# Patient Record
Sex: Female | Born: 1950
Health system: Southern US, Community
[De-identification: ages and names within clinical notes are randomized; demographics above are authoritative.]

## PROBLEM LIST (undated history)

## (undated) DIAGNOSIS — E1165 Type 2 diabetes mellitus with hyperglycemia: Secondary | ICD-10-CM

## (undated) DIAGNOSIS — K219 Gastro-esophageal reflux disease without esophagitis: Secondary | ICD-10-CM

## (undated) DIAGNOSIS — T7840XA Allergy, unspecified, initial encounter: Secondary | ICD-10-CM

## (undated) DIAGNOSIS — Z90710 Acquired absence of both cervix and uterus: Secondary | ICD-10-CM

## (undated) DIAGNOSIS — R740 Nonspecific elevation of levels of transaminase and lactic acid dehydrogenase [LDH]: Secondary | ICD-10-CM

## (undated) DIAGNOSIS — M1990 Unspecified osteoarthritis, unspecified site: Secondary | ICD-10-CM

## (undated) DIAGNOSIS — J309 Allergic rhinitis, unspecified: Secondary | ICD-10-CM

## (undated) DIAGNOSIS — R002 Palpitations: Secondary | ICD-10-CM

## (undated) DIAGNOSIS — R5381 Other malaise: Secondary | ICD-10-CM

## (undated) DIAGNOSIS — E785 Hyperlipidemia, unspecified: Secondary | ICD-10-CM

## (undated) DIAGNOSIS — I10 Essential (primary) hypertension: Secondary | ICD-10-CM

## (undated) DIAGNOSIS — M722 Plantar fascial fibromatosis: Secondary | ICD-10-CM

## (undated) DIAGNOSIS — G4730 Sleep apnea, unspecified: Secondary | ICD-10-CM

## (undated) DIAGNOSIS — G5760 Lesion of plantar nerve, unspecified lower limb: Secondary | ICD-10-CM

## (undated) DIAGNOSIS — R5383 Other fatigue: Secondary | ICD-10-CM

## (undated) DIAGNOSIS — G4733 Obstructive sleep apnea (adult) (pediatric): Secondary | ICD-10-CM

## (undated) DIAGNOSIS — IMO0001 Reserved for inherently not codable concepts without codable children: Secondary | ICD-10-CM

## (undated) HISTORY — DX: Gastro-esophageal reflux disease without esophagitis: K21.9

## (undated) HISTORY — DX: Acquired absence of both cervix and uterus: Z90.710

## (undated) HISTORY — PX: OTHER SURGICAL HISTORY: SHX169

## (undated) HISTORY — PX: REDUCTION MAMMAPLASTY: SUR839

## (undated) HISTORY — DX: Hyperlipidemia, unspecified: E78.5

## (undated) HISTORY — DX: Essential (primary) hypertension: I10

## (undated) HISTORY — DX: Lesion of plantar nerve, unspecified lower limb: G57.60

## (undated) HISTORY — DX: Other malaise: R53.81

## (undated) HISTORY — DX: Obstructive sleep apnea (adult) (pediatric): G47.33

## (undated) HISTORY — DX: Plantar fascial fibromatosis: M72.2

## (undated) HISTORY — PX: BREAST BIOPSY: SHX20

## (undated) HISTORY — DX: Sleep apnea, unspecified: G47.30

## (undated) HISTORY — DX: Other fatigue: R53.83

## (undated) HISTORY — DX: Allergic rhinitis, unspecified: J30.9

## (undated) HISTORY — DX: Allergy, unspecified, initial encounter: T78.40XA

## (undated) HISTORY — DX: Nonspecific elevation of levels of transaminase and lactic acid dehydrogenase (ldh): R74.0

## (undated) HISTORY — DX: Reserved for inherently not codable concepts without codable children: IMO0001

## (undated) HISTORY — DX: Palpitations: R00.2

## (undated) HISTORY — DX: Type 2 diabetes mellitus with hyperglycemia: E11.65

## (undated) HISTORY — PX: BASAL CELL CARCINOMA EXCISION: SHX1214

## (undated) HISTORY — DX: Unspecified osteoarthritis, unspecified site: M19.90

---
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
## 1974-12-05 HISTORY — PX: TUBAL LIGATION: SHX77

## 1977-12-05 HISTORY — PX: ABDOMINAL HYSTERECTOMY: SHX81

## 1990-12-05 HISTORY — PX: BREAST REDUCTION SURGERY: SHX8

## 1990-12-05 HISTORY — PX: REDUCTION MAMMAPLASTY: SUR839

## 1999-01-17 ENCOUNTER — Ambulatory Visit: Admission: RE | Admit: 1999-01-17 | Discharge: 1999-01-17 | Payer: Self-pay | Admitting: *Deleted

## 1999-08-26 ENCOUNTER — Ambulatory Visit (HOSPITAL_COMMUNITY): Admission: RE | Admit: 1999-08-26 | Discharge: 1999-08-26 | Payer: Self-pay | Admitting: Gastroenterology

## 1999-08-26 ENCOUNTER — Encounter: Payer: Self-pay | Admitting: Gastroenterology

## 1999-11-12 ENCOUNTER — Encounter: Payer: Self-pay | Admitting: Gastroenterology

## 1999-11-12 ENCOUNTER — Encounter: Admission: RE | Admit: 1999-11-12 | Discharge: 1999-11-12 | Payer: Self-pay | Admitting: Gastroenterology

## 1999-12-03 ENCOUNTER — Encounter: Payer: Self-pay | Admitting: Gastroenterology

## 1999-12-03 ENCOUNTER — Encounter: Admission: RE | Admit: 1999-12-03 | Discharge: 1999-12-03 | Payer: Self-pay | Admitting: Gastroenterology

## 2000-06-12 ENCOUNTER — Encounter: Admission: RE | Admit: 2000-06-12 | Discharge: 2000-06-12 | Payer: Self-pay | Admitting: Gastroenterology

## 2000-06-12 ENCOUNTER — Encounter: Payer: Self-pay | Admitting: Gastroenterology

## 2000-12-28 ENCOUNTER — Encounter: Payer: Self-pay | Admitting: Internal Medicine

## 2000-12-28 ENCOUNTER — Ambulatory Visit (HOSPITAL_COMMUNITY): Admission: RE | Admit: 2000-12-28 | Discharge: 2000-12-28 | Payer: Self-pay | Admitting: Internal Medicine

## 2001-06-13 ENCOUNTER — Encounter: Payer: Self-pay | Admitting: Gastroenterology

## 2001-06-13 ENCOUNTER — Encounter: Admission: RE | Admit: 2001-06-13 | Discharge: 2001-06-13 | Payer: Self-pay | Admitting: Gastroenterology

## 2001-10-04 ENCOUNTER — Ambulatory Visit (HOSPITAL_COMMUNITY): Admission: RE | Admit: 2001-10-04 | Discharge: 2001-10-04 | Payer: Self-pay | Admitting: Gastroenterology

## 2002-06-10 ENCOUNTER — Encounter: Admission: RE | Admit: 2002-06-10 | Discharge: 2002-06-10 | Payer: Self-pay | Admitting: Gastroenterology

## 2002-06-10 ENCOUNTER — Encounter: Payer: Self-pay | Admitting: Gastroenterology

## 2003-04-24 ENCOUNTER — Encounter: Payer: Self-pay | Admitting: Sports Medicine

## 2003-04-24 ENCOUNTER — Encounter: Admission: RE | Admit: 2003-04-24 | Discharge: 2003-04-24 | Payer: Self-pay | Admitting: Sports Medicine

## 2003-07-17 ENCOUNTER — Encounter: Admission: RE | Admit: 2003-07-17 | Discharge: 2003-07-17 | Payer: Self-pay | Admitting: Gastroenterology

## 2003-07-17 ENCOUNTER — Encounter: Payer: Self-pay | Admitting: Gastroenterology

## 2004-07-28 ENCOUNTER — Encounter: Admission: RE | Admit: 2004-07-28 | Discharge: 2004-07-28 | Payer: Self-pay | Admitting: Gastroenterology

## 2004-10-01 ENCOUNTER — Encounter: Admission: RE | Admit: 2004-10-01 | Discharge: 2004-10-01 | Payer: Self-pay | Admitting: Gastroenterology

## 2004-12-05 HISTORY — PX: COLONOSCOPY: SHX174

## 2005-04-15 ENCOUNTER — Ambulatory Visit: Payer: Self-pay | Admitting: Pulmonary Disease

## 2005-05-20 ENCOUNTER — Ambulatory Visit: Payer: Self-pay | Admitting: Pulmonary Disease

## 2005-08-01 ENCOUNTER — Encounter: Admission: RE | Admit: 2005-08-01 | Discharge: 2005-08-01 | Payer: Self-pay | Admitting: Gastroenterology

## 2005-11-17 ENCOUNTER — Ambulatory Visit: Payer: Self-pay | Admitting: Pulmonary Disease

## 2006-01-10 ENCOUNTER — Other Ambulatory Visit: Admission: RE | Admit: 2006-01-10 | Discharge: 2006-01-10 | Payer: Self-pay | Admitting: Obstetrics and Gynecology

## 2006-02-08 ENCOUNTER — Ambulatory Visit: Payer: Self-pay | Admitting: Pulmonary Disease

## 2006-09-20 ENCOUNTER — Encounter: Admission: RE | Admit: 2006-09-20 | Discharge: 2006-09-20 | Payer: Self-pay | Admitting: Gastroenterology

## 2006-11-07 ENCOUNTER — Ambulatory Visit: Payer: Self-pay | Admitting: Pulmonary Disease

## 2007-01-05 ENCOUNTER — Ambulatory Visit: Payer: Self-pay | Admitting: Pulmonary Disease

## 2007-10-09 DIAGNOSIS — I10 Essential (primary) hypertension: Secondary | ICD-10-CM | POA: Insufficient documentation

## 2007-10-09 DIAGNOSIS — E785 Hyperlipidemia, unspecified: Secondary | ICD-10-CM | POA: Insufficient documentation

## 2007-10-09 DIAGNOSIS — J309 Allergic rhinitis, unspecified: Secondary | ICD-10-CM

## 2007-10-09 DIAGNOSIS — G4733 Obstructive sleep apnea (adult) (pediatric): Secondary | ICD-10-CM | POA: Insufficient documentation

## 2007-10-09 DIAGNOSIS — IMO0001 Reserved for inherently not codable concepts without codable children: Secondary | ICD-10-CM

## 2007-10-09 HISTORY — DX: Essential (primary) hypertension: I10

## 2007-10-09 HISTORY — DX: Obstructive sleep apnea (adult) (pediatric): G47.33

## 2007-10-09 HISTORY — DX: Reserved for inherently not codable concepts without codable children: IMO0001

## 2007-10-09 HISTORY — DX: Hyperlipidemia, unspecified: E78.5

## 2007-10-09 HISTORY — DX: Allergic rhinitis, unspecified: J30.9

## 2007-11-07 ENCOUNTER — Encounter: Admission: RE | Admit: 2007-11-07 | Discharge: 2007-11-07 | Payer: Self-pay | Admitting: Gastroenterology

## 2008-02-06 ENCOUNTER — Encounter (INDEPENDENT_AMBULATORY_CARE_PROVIDER_SITE_OTHER): Payer: Self-pay | Admitting: *Deleted

## 2008-02-20 ENCOUNTER — Ambulatory Visit: Payer: Self-pay | Admitting: Pulmonary Disease

## 2008-10-21 ENCOUNTER — Ambulatory Visit: Payer: Self-pay | Admitting: Pulmonary Disease

## 2008-10-21 DIAGNOSIS — R5383 Other fatigue: Secondary | ICD-10-CM

## 2008-10-21 DIAGNOSIS — G4710 Hypersomnia, unspecified: Secondary | ICD-10-CM | POA: Insufficient documentation

## 2008-10-21 DIAGNOSIS — R5381 Other malaise: Secondary | ICD-10-CM

## 2008-10-21 HISTORY — DX: Other fatigue: R53.83

## 2008-10-21 HISTORY — DX: Other malaise: R53.81

## 2008-12-08 ENCOUNTER — Encounter: Admission: RE | Admit: 2008-12-08 | Discharge: 2008-12-08 | Payer: Self-pay | Admitting: Gastroenterology

## 2008-12-08 ENCOUNTER — Encounter: Payer: Self-pay | Admitting: Family Medicine

## 2009-03-16 ENCOUNTER — Ambulatory Visit: Payer: Self-pay | Admitting: Family Medicine

## 2009-03-16 DIAGNOSIS — E1165 Type 2 diabetes mellitus with hyperglycemia: Secondary | ICD-10-CM

## 2009-03-16 DIAGNOSIS — E119 Type 2 diabetes mellitus without complications: Secondary | ICD-10-CM | POA: Insufficient documentation

## 2009-03-16 DIAGNOSIS — IMO0002 Reserved for concepts with insufficient information to code with codable children: Secondary | ICD-10-CM | POA: Insufficient documentation

## 2009-03-16 DIAGNOSIS — IMO0001 Reserved for inherently not codable concepts without codable children: Secondary | ICD-10-CM

## 2009-03-16 HISTORY — DX: Reserved for inherently not codable concepts without codable children: IMO0001

## 2009-03-17 LAB — CONVERTED CEMR LAB
AST: 63 units/L — ABNORMAL HIGH (ref 0–37)
Alkaline Phosphatase: 66 units/L (ref 39–117)
Calcium: 9.2 mg/dL (ref 8.4–10.5)
GFR calc non Af Amer: 90.85 mL/min (ref >60)
Hgb A1c MFr Bld: 8.8 % — ABNORMAL HIGH (ref 4.6–6.5)
LDL Cholesterol: 92 mg/dL (ref 0–99)
Potassium: 3.9 meq/L (ref 3.5–5.1)
Sodium: 139 meq/L (ref 135–145)
Total Bilirubin: 1.4 mg/dL — ABNORMAL HIGH (ref 0.3–1.2)
VLDL: 22 mg/dL (ref 0.0–40.0)

## 2009-04-15 ENCOUNTER — Encounter: Payer: Self-pay | Admitting: Pulmonary Disease

## 2009-04-16 ENCOUNTER — Telehealth (INDEPENDENT_AMBULATORY_CARE_PROVIDER_SITE_OTHER): Payer: Self-pay | Admitting: *Deleted

## 2009-04-21 ENCOUNTER — Telehealth (INDEPENDENT_AMBULATORY_CARE_PROVIDER_SITE_OTHER): Payer: Self-pay | Admitting: *Deleted

## 2009-04-24 ENCOUNTER — Telehealth: Payer: Self-pay | Admitting: Pulmonary Disease

## 2009-05-20 ENCOUNTER — Telehealth: Payer: Self-pay | Admitting: *Deleted

## 2009-07-08 ENCOUNTER — Ambulatory Visit: Payer: Self-pay | Admitting: Family Medicine

## 2009-07-08 DIAGNOSIS — R7402 Elevation of levels of lactic acid dehydrogenase (LDH): Secondary | ICD-10-CM

## 2009-07-08 DIAGNOSIS — R740 Nonspecific elevation of levels of transaminase and lactic acid dehydrogenase [LDH]: Secondary | ICD-10-CM

## 2009-07-08 DIAGNOSIS — R7401 Elevation of levels of liver transaminase levels: Secondary | ICD-10-CM

## 2009-07-08 HISTORY — DX: Elevation of levels of lactic acid dehydrogenase (LDH): R74.02

## 2009-07-08 HISTORY — DX: Elevation of levels of liver transaminase levels: R74.01

## 2009-07-13 ENCOUNTER — Telehealth: Payer: Self-pay | Admitting: Family Medicine

## 2009-07-13 LAB — CONVERTED CEMR LAB
AST: 61 units/L — ABNORMAL HIGH (ref 0–37)
Albumin: 3.8 g/dL (ref 3.5–5.2)
Alkaline Phosphatase: 71 units/L (ref 39–117)
Bilirubin, Direct: 0.2 mg/dL (ref 0.0–0.3)
Total Protein: 6.9 g/dL (ref 6.0–8.3)

## 2009-07-15 ENCOUNTER — Ambulatory Visit: Payer: Self-pay | Admitting: Family Medicine

## 2009-07-20 ENCOUNTER — Telehealth: Payer: Self-pay | Admitting: Family Medicine

## 2009-07-30 ENCOUNTER — Telehealth: Payer: Self-pay | Admitting: Family Medicine

## 2009-10-09 ENCOUNTER — Ambulatory Visit: Payer: Self-pay | Admitting: Family Medicine

## 2009-10-23 ENCOUNTER — Telehealth: Payer: Self-pay | Admitting: Family Medicine

## 2009-12-25 ENCOUNTER — Encounter: Admission: RE | Admit: 2009-12-25 | Discharge: 2009-12-25 | Payer: Self-pay | Admitting: Family Medicine

## 2010-01-08 ENCOUNTER — Ambulatory Visit: Payer: Self-pay | Admitting: Family Medicine

## 2010-01-08 LAB — CONVERTED CEMR LAB
Bilirubin Urine: NEGATIVE
Blood in Urine, dipstick: NEGATIVE
Glucose, Urine, Semiquant: NEGATIVE
Specific Gravity, Urine: 1.015
WBC Urine, dipstick: NEGATIVE
pH: 7

## 2010-01-11 LAB — CONVERTED CEMR LAB: Hgb A1c MFr Bld: 8.2 % — ABNORMAL HIGH (ref 4.6–6.5)

## 2010-05-10 ENCOUNTER — Ambulatory Visit: Payer: Self-pay | Admitting: Family Medicine

## 2010-05-31 ENCOUNTER — Encounter: Payer: Self-pay | Admitting: Family Medicine

## 2010-06-17 ENCOUNTER — Telehealth: Payer: Self-pay | Admitting: Family Medicine

## 2010-07-09 ENCOUNTER — Ambulatory Visit: Payer: Self-pay | Admitting: Pulmonary Disease

## 2010-08-16 ENCOUNTER — Telehealth: Payer: Self-pay | Admitting: Family Medicine

## 2010-08-23 ENCOUNTER — Ambulatory Visit: Payer: Self-pay | Admitting: Family Medicine

## 2010-08-26 LAB — CONVERTED CEMR LAB
Albumin: 3.8 g/dL (ref 3.5–5.2)
Cholesterol: 188 mg/dL (ref 0–200)
Hgb A1c MFr Bld: 10.5 % — ABNORMAL HIGH (ref 4.6–6.5)
LDL Cholesterol: 107 mg/dL — ABNORMAL HIGH (ref 0–99)
Total Protein: 6.5 g/dL (ref 6.0–8.3)
Triglycerides: 173 mg/dL — ABNORMAL HIGH (ref 0.0–149.0)
VLDL: 34.6 mg/dL (ref 0.0–40.0)

## 2010-08-30 ENCOUNTER — Ambulatory Visit: Payer: Self-pay | Admitting: Family Medicine

## 2010-08-31 ENCOUNTER — Telehealth: Payer: Self-pay | Admitting: *Deleted

## 2010-09-30 ENCOUNTER — Encounter: Payer: Self-pay | Admitting: Family Medicine

## 2010-09-30 ENCOUNTER — Ambulatory Visit: Payer: Self-pay | Admitting: Family Medicine

## 2010-10-14 ENCOUNTER — Ambulatory Visit: Payer: Self-pay | Admitting: Family Medicine

## 2010-10-15 LAB — CONVERTED CEMR LAB
ALT: 98 units/L — ABNORMAL HIGH (ref 0–35)
AST: 75 units/L — ABNORMAL HIGH (ref 0–37)
Albumin: 3.8 g/dL (ref 3.5–5.2)
Cholesterol: 207 mg/dL — ABNORMAL HIGH (ref 0–200)
Direct LDL: 124.9 mg/dL
Total Protein: 6.6 g/dL (ref 6.0–8.3)
Triglycerides: 125 mg/dL (ref 0.0–149.0)

## 2010-11-02 ENCOUNTER — Ambulatory Visit: Payer: Self-pay | Admitting: Internal Medicine

## 2010-11-02 DIAGNOSIS — J069 Acute upper respiratory infection, unspecified: Secondary | ICD-10-CM | POA: Insufficient documentation

## 2010-12-05 HISTORY — PX: BREAST BIOPSY: SHX20

## 2010-12-07 LAB — HM MAMMOGRAPHY

## 2010-12-09 ENCOUNTER — Ambulatory Visit
Admission: RE | Admit: 2010-12-09 | Discharge: 2010-12-09 | Payer: Self-pay | Source: Home / Self Care | Attending: Family Medicine | Admitting: Family Medicine

## 2010-12-09 ENCOUNTER — Other Ambulatory Visit: Payer: Self-pay | Admitting: Family Medicine

## 2010-12-09 LAB — HEPATIC FUNCTION PANEL
ALT: 85 U/L — ABNORMAL HIGH (ref 0–35)
AST: 71 U/L — ABNORMAL HIGH (ref 0–37)
Albumin: 3.8 g/dL (ref 3.5–5.2)
Alkaline Phosphatase: 91 U/L (ref 39–117)
Bilirubin, Direct: 0.2 mg/dL (ref 0.0–0.3)
Total Bilirubin: 1.2 mg/dL (ref 0.3–1.2)
Total Protein: 6.8 g/dL (ref 6.0–8.3)

## 2010-12-09 LAB — LIPID PANEL
Cholesterol: 165 mg/dL (ref 0–200)
HDL: 56.8 mg/dL (ref >39.00)
LDL Cholesterol: 91 mg/dL (ref 0–99)
Total CHOL/HDL Ratio: 3
Triglycerides: 84 mg/dL (ref 0.0–149.0)
VLDL: 16.8 mg/dL (ref 0.0–40.0)

## 2010-12-09 LAB — HEMOGLOBIN A1C: Hgb A1c MFr Bld: 8.6 % — ABNORMAL HIGH (ref 4.6–6.5)

## 2010-12-26 ENCOUNTER — Encounter: Payer: Self-pay | Admitting: Gastroenterology

## 2010-12-27 ENCOUNTER — Encounter
Admission: RE | Admit: 2010-12-27 | Discharge: 2010-12-27 | Payer: Self-pay | Source: Home / Self Care | Attending: Family Medicine | Admitting: Family Medicine

## 2010-12-31 ENCOUNTER — Encounter: Payer: Self-pay | Admitting: Family Medicine

## 2010-12-31 ENCOUNTER — Other Ambulatory Visit: Payer: Self-pay | Admitting: Diagnostic Radiology

## 2010-12-31 ENCOUNTER — Encounter
Admission: RE | Admit: 2010-12-31 | Discharge: 2010-12-31 | Payer: Self-pay | Source: Home / Self Care | Attending: Family Medicine | Admitting: Family Medicine

## 2011-01-04 NOTE — Assessment & Plan Note (Signed)
   Vital Signs:  Patient profile:   60 year old female Menstrual status:  hysterectomy Weight:      179 pounds Temp:     98.3 degrees F oral BP sitting:   140 / 90  (left arm) Cuff size:   regular  History of Present Illness: Here for follow up diabetes.  Recent initiation Lantus. Currently 40 units daily.  Fasting around 169-189.   No hypoglycemia except one time episode of mild symptoms after "power walk."  Overall  feels better.  Working with RD at [employer].  No problems with insulin injections.Tolerating Crestor without side effects.  Allergies: 1)  ! Codeine Sulfate (Codeine Sulfate) 2)  ! Avalide (Irbesartan-Hydrochlorothiazide) 3)  ! * H2 Blocker 4)  ! Erythromycin Base (Erythromycin Base) 5)  ! * Latex  Past History:  Past Medical History: Last updated: 04/07/2009 Allergic Rhinitis Hyperlipidemia Hypertension AHI 12.2 5/06 on CPAP +10 cm fibromyalgia Chronic bronchitis diabetes mellitus type ll periodic headaches/migraines IBS Osteoporosis Bilateral breast pain Postmenopausal Sleep Apnea Partial small bowel obsruction Schatki ring Reflux Herniated disk at C7 PMH reviewed for relevance  Physical Exam  General:  Well-developed,well-nourished,in no acute distress; alert,appropriate and cooperative throughout examination Neck:  No deformities, masses, or tenderness noted. Lungs:  Normal respiratory effort, chest expands symmetrically. Lungs are clear to auscultation, no crackles or wheezes. Heart:  Normal rate and regular rhythm. S1 and S2 normal without gallop, murmur, click, rub or other extra sounds. Extremities:  No clubbing, cyanosis, edema, or deformity noted with normal full range of motion of all joints.     Impression & Recommendations:  Problem # 1:  DIABETES MELLITUS, UNCONTROLLED (ICD-250.02) Assessment Improved increase lantus to 45 units then continue slow titration as instructed. Her updated medication list for this problem  includes:    Metformin Hcl 1000 Mg Tabs (Metformin hcl) ..... One by mouth two times a day    Hyzaar 100-25 Mg Tabs (Losartan potassium-hctz) ..... One by mouth once daily    Lantus Solostar 100 Unit/ml Soln (Insulin glargine) ..... Use as directed  Complete Medication List: 1)  Crestor 5 Mg Tabs (Rosuvastatin calcium) .... One by mouth once daily 2)  Lexapro 10 Mg Tabs (Escitalopram oxalate) .... Once daily 3)  Metformin Hcl 1000 Mg Tabs (Metformin hcl) .... One by mouth two times a day 4)  Hyzaar 100-25 Mg Tabs (Losartan potassium-hctz) .... One by mouth once daily 5)  Bd Ultra-fine Lll Mini Pen Needle 3/16", 31 Gauge  .... One two times a day 6)  Susternex Probiotic  .... Take 1 tablet by mouth once a day 7)  Lantus Solostar 100 Unit/ml Soln (Insulin glargine) .... Use as directed 8)  Bd Pen Needle Ultrafine 29g X 12.62m Misc (Insulin pen needle) .... Use daily as directed  Patient Instructions: 1)  lipid and hepatic panel in 2-3 weeks  272.4 2)  Titrate Lantus to 45 units and continue to titrate as directed. 3)  office follow up in 2 months.   Orders Added: 1)  Est. Patient Level III [[72536]

## 2011-01-04 NOTE — Letter (Signed)
Summary: Diabetic Eye Exam/[facility]  Diabetic Eye Exam/[facility]   Imported By: Laural Benes 06/08/2010 13:05:13  _____________________________________________________________________  External Attachment:    Type:   Image     Comment:   External Document

## 2011-01-04 NOTE — Assessment & Plan Note (Signed)
Summary: discuss insulin conversion/dm   Vital Signs:  Patient profile:   60 year old female Menstrual status:  hysterectomy Weight:      179 pounds Temp:     98.5 degrees F oral BP sitting:   152 / 86  (left arm) Cuff size:   regular  Vitals Entered By: Nira Conn LPN (August 30, 1442 1:44 PM)  History of Present Illness: Patient here to discuss the following  Type 2 diabetes poorly controlled. On maximum dose metformin. Has been on victoza without much improvement.  Recent A1C over 10 %.   Here today to discuss potential options. Has never been on insulin previously. Fasting blood sugars consistently over 200.  Hyperlipidemia. Muscle fatigue and muscle aches on Lipitor. She would like to explore other options. Has never been on other statins.  Hypertension. She felt she had better control on non-generic. Would like to go back on Hyzaar.  Hypertension History:      She denies headache, chest pain, palpitations, dyspnea with exertion, orthopnea, PND, peripheral edema, visual symptoms, neurologic problems, syncope, and side effects from treatment.  She notes no problems with any antihypertensive medication side effects.        Positive major cardiovascular risk factors include female age 42 years old or older, diabetes, hyperlipidemia, and hypertension.  Negative major cardiovascular risk factors include non-tobacco-user status.     Allergies: 1)  ! Codeine Sulfate (Codeine Sulfate) 2)  ! Avalide (Irbesartan-Hydrochlorothiazide) 3)  ! * H2 Blocker 4)  ! Erythromycin Base (Erythromycin Base) 5)  ! * Latex  Past History:  Past Medical History: Last updated: 04/07/2009 Allergic Rhinitis Hyperlipidemia Hypertension AHI 12.2 5/06 on CPAP +10 cm fibromyalgia Chronic bronchitis diabetes mellitus type ll periodic headaches/migraines IBS Osteoporosis Bilateral breast pain Postmenopausal Sleep Apnea Partial small bowel obsruction Schatki ring Reflux Herniated disk at  C7 PMH reviewed for relevance  Review of Systems      See HPI  Physical Exam  General:  Well-developed,well-nourished,in no acute distress; alert,appropriate and cooperative throughout examination Neck:  No deformities, masses, or tenderness noted. Lungs:  Normal respiratory effort, chest expands symmetrically. Lungs are clear to auscultation, no crackles or wheezes. Heart:  Normal rate and regular rhythm. S1 and S2 normal without gallop, murmur, click, rub or other extra sounds.   Impression & Recommendations:  Problem # 1:  DIABETES MELLITUS, UNCONTROLLED (ICD-250.02) We discused options for better control.  Will initiate Lantus at 10 units daily and titration regimen given.  Cont metformin. reassess in one month.  D/C victoza. The following medications were removed from the medication list:    Victoza 18 Mg/66m Soln (Liraglutide) ..... Inject 1.2 mg daily Her updated medication list for this problem includes:    Metformin Hcl 1000 Mg Tabs (Metformin hcl) ..... One by mouth two times a day    Hyzaar 100-25 Mg Tabs (Losartan potassium-hctz) ..... One by mouth once daily    Lantus Solostar 100 Unit/ml Soln (Insulin glargine) ..... Use as directed  Problem # 2:  HYPERTENSION (ICD-401.9) change back to Hyzaar. Her updated medication list for this problem includes:    Hyzaar 100-25 Mg Tabs (Losartan potassium-hctz) ..... One by mouth once daily  Problem # 3:  HYPERLIPIDEMIA (ICD-272.4) change to Crestor 5 mg with samples given.   Her updated medication list for this problem includes:    Crestor 5 Mg Tabs (Rosuvastatin calcium) ..... One by mouth once daily  Problem # 4:  Preventive Health Care (ICD-V70.0) flu vaccine given .  Problem # 5:  TRANSAMINASES, SERUM, ELEVATED (ICD-790.4) will need reassessment in 6 weeks after starting Crestor.  Complete Medication List: 1)  Crestor 5 Mg Tabs (Rosuvastatin calcium) .... One by mouth once daily 2)  Lexapro 10 Mg Tabs (Escitalopram  oxalate) .... Once daily 3)  Metformin Hcl 1000 Mg Tabs (Metformin hcl) .... One by mouth two times a day 4)  Hyzaar 100-25 Mg Tabs (Losartan potassium-hctz) .... One by mouth once daily 5)  Bd Ultra-fine Lll Mini Pen Needle 3/16", 31 Gauge  .... One two times a day 6)  Susternex Probiotic  .... Take 1 tablet by mouth once a day 7)  Lantus Solostar 100 Unit/ml Soln (Insulin glargine) .... Use as directed 8)  Bd Pen Needle Ultrafine 29g X 12.45m Misc (Insulin pen needle) .... Use daily as directed  Other Orders: Flu Vaccine 382yr+ (9(19622Admin 1st Vaccine (9(29798 Hypertension Assessment/Plan:      The patient's hypertensive risk group is category C: Target organ damage and/or diabetes.  Her calculated 10 year risk of coronary heart disease is 27 %.  Today's blood pressure is 152/86.    Patient Instructions: 1)  Start Lantus 10 units daily. 2)  Titrate up by 2 units every other day until fastings consistently <130 fasting. 3)  Please schedule a follow-up appointment in 1 month.  Prescriptions: BD PEN NEEDLE ULTRAFINE 29G X 12.7MM MISC (INSULIN PEN NEEDLE) use daily as directed  #50 x 3   Entered by:   NaNira ConnPN   Authorized by:   BrCarolann LittlerD   Signed by:   NaNira ConnPN on 0992/10/9416 Method used:   Electronically to        [facility] [address] [address]       [address]      [place]  [address]     Ph: [phone]     Fax: [phone] RxID:   [id number] 100-25 MG TABS (LORoseboroone by mouth once daily  #30 x 11   Entered and Authorized by:   BrCarolann LittlerD   Signed by:   BrCarolann LittlerD on 08/30/2010   Method used:   Electronically to        [facility] [address] [address]       [address]      [place]  [address]     Ph: [phone]     Fax: [phone] RxID:   [id number] SOLOSTAR 100 UNIT/ML SOLN (INSULIN GLARGINE)  use as directed  #1 x 5   Entered and Authorized by:   BrCarolann LittlerD   Signed by:   BrCarolann LittlerD on 08/30/2010   Method used:   Electronically to        [facility] [address]       [address]      [place]  [address]     Ph: [phone]     Fax: [phone] RxID: :   [id number]  Immunizations Administered:  Influenza Vaccine # 1:    Vaccine Type: Fluvax 3+    Site: left deltoid    Mfr: GlaxoSmithKline    Dose: 0.5 ml    Route: IM  Given by: Nira Conn LPN    Exp. Date: 06/04/2011    Lot #: [id number]    VIS given: 06/29/10 version given August 30, 2010.  Flu Vaccine Consent Questions:    Do you have a history of severe allergic reactions to this vaccine? no    Any prior history of allergic reactions to egg and/or gelatin? no    Do you have a sensitivity to the preservative Thimersol? no    Do you have a past history of Guillan-Barre Syndrome? no    Do you currently have an acute febrile illness? no    Have you ever had a severe reaction to latex? no    Vaccine information given and explained to patient? yes    Are you currently pregnant? no

## 2011-01-04 NOTE — Progress Notes (Signed)
Summary:  requesting labs to be faxed  Phone Note Call from Patient Call back at Work Phone [phone]   Caller: Patient---voice mail Call For: Carolann Littler MD Summary of Call: requesting her last 2 blood test results to [phone]. She will be working with a PHD RD. her appt with her is tomorrow. Initial call taken by: Despina Arias,  August 31, 2010 4:12 PM  Follow-up for Phone Call        Labs printed, message left fot pt to return call to confirm Fax and that she will be present at fax.    LMTCB again this morning to confirm presence at fax when medical records will be faxed Nira Conn LPN  September 01, 1482 8:26 AM  Follow-up by: Nira Conn LPN,  August 31, 734 5:25 PM  Additional Follow-up for Phone Call Additional follow up Details #1::        Pt left another VM, requested labs be mailed to her home address for F/U with PHD RD next week Additional Follow-up by: Nira Conn LPN,  September 01, 4300 4:42 PM    New/Updated Medications: WARFARIN SODIUM 10 MG  TABS (WARFARIN SODIUM)

## 2011-01-04 NOTE — Assessment & Plan Note (Signed)
Summary: f/u sleep apnea///kp   Primary Provider/Referring Provider:  [employer]  CC:  Pt is here for a f/u appt on her OSA.  Pt states she wears her cpap machine every night.  Approx 6 hours per night.  Pt states she has noticed "apnea episodes" dispite wearing cpap machine.  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 PSG 5/06 showed mild OSA with AHI 12/hr, severe in REM with AHI 32/h --> corrected with CPAP +10 cm , pillows with chin strap ( confirmed by download on auto )  11/09 >> choking episodes, has to keep her hand on pillows to keep it in place, chin strap did not work.  July 09, 2010 10:10 AM  Lost 20 lbs since study, she wears her cpap machine every night.  Approx 6 hours per night.  Pt states she has noticed "apnea episodes" dispite wearing cpap machine.  Pt c/o  dry mouth and dry eyes d/t the cpap machine .Marland Kitchen  Current Medications (verified): 1)  Lipitor 10 Mg  Tabs (Atorvastatin Calcium) .... Once Daily 2)  Lexapro 10 Mg  Tabs (Escitalopram Oxalate) .... Once Daily 3)  Metformin Hcl 1000 Mg Tabs (Metformin Hcl) .... One By Mouth Two Times A Day 4)  Losartan Potassium-Hctz 100-25 Mg Tabs (Losartan Potassium-Hctz) .... One By Mouth Once Daily 5)  Bd Ultra-Fine Lll Mini Pen Needle 3/16", 31 Gauge .... One Two Times A Day 6)  Victoza 18 Mg/63m Soln (Liraglutide) .... Inject 1.2 Mg Daily 7)  Susternex Probiotic .... Take 1 Tablet By Mouth Once A Day  Allergies (verified): 1)  ! Codeine Sulfate (Codeine Sulfate) 2)  ! Avalide (Irbesartan-Hydrochlorothiazide) 3)  ! * H2 Blocker 4)  ! Erythromycin Base (Erythromycin Base) 5)  ! 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    Vital Signs:  Patient profile:   60year old female Menstrual status:  hysterectomy Height:      62.5 inches Weight:      180.13 pounds BMI:     32.54 O2 Sat:      94 % on Room air Temp:     98.5 degrees F oral Pulse rate:   97 / minute BP sitting:   150 / 96  (left arm) Cuff size:   regular  Vitals Entered By: MMatthew FolksLPN (August  5, 227789:49 AM)  O2 Flow:  Room air CC: Pt is here for a f/u appt on her OSA.  Pt states she wears her cpap machine every night.  Approx 6 hours per night.  Pt states she has noticed "apnea episodes" dispite wearing cpap machine.  Pt c/o  dry mouth and dry eyes d/t  the cpap machine . Comments Medications reviewed with patient Matthew Folks LPN  July 10, 4491 9:50 AM    Physical Exam  Additional Exam:  Gen. Pleasant, well-nourished, in no distress ENT - no lesions, no post nasal drip, class 2 airway Neck: No JVD, no thyromegaly, no carotid bruits Lungs: no use of accessory muscles, no dullness to percussion, clear without rales or rhonchi  Cardiovascular: Rhythm regular, heart sounds  normal, no murmurs or gallops, no peripheral edema Musculoskeletal: No deformities, no cyanosis or clubbing      Impression &  Recommendations:  Problem # 1:  Hx of OBSTRUCTIVE SLEEP APNEA (ICD-327.23) Discussed options including autoCPAP vs revisiting with split study since she has lost 20 lbs & obstructive sleep apnea was mild- moderate to begin with. Compliance encouraged, wt loss emphasized, asked to avoid meds with sedative side effects, cautioned against driving when sleepy.  The pathophysiology of obstructive sleep apnea, it's cardiovascular consequences and modes of treatment including CPAP were discussed with the patient in great detail.  Orders: Sleep Disorder Referral (Sleep Disorder) Est. Patient Level III (01007)  Medications Added to Medication List This Visit: 1)  Victoza 18 Mg/87m Soln (Liraglutide) .... Inject 1.2 mg daily 2)  Susternex Probiotic  .... Take 1 tablet by mouth once a day  Patient Instructions: 1)  Please schedule a follow-up appointment in 2 weeks after sleep study

## 2011-01-04 NOTE — Assessment & Plan Note (Signed)
Summary: congestion//ccm   Vital Signs:  Patient profile:   60 year old female Menstrual status:  hysterectomy Weight:      180 pounds Temp:     98.2 degrees F oral BP sitting:   118 / 72  (right arm) Cuff size:   regular  Vitals Entered By: Cay Schillings LPN (November 03, 4127 3:33 PM) CC: c/o sinus congestion, chest congestion, slightly productive cough   fbs 134 Is Patient Diabetic? Yes   Primary Care Provider:  Carolann Littler MD  CC:  c/o sinus congestion, chest congestion, and slightly productive cough   fbs 134.  History of Present Illness: 60 year old patient who presents with a 4-day history of cough, sinus congestion, intermittent fever.  She is also noticed some chest congestion.  She has hypertension and type 2 diabetes.  She is requesting a Z-Pak.  Her diabetes has been under reasonable control.  She is on Crestor for dyslipidemia.  There is been no chest pain, wheezing, shortness of breath, or purulent sputum production  Allergies: 1)  ! Codeine Sulfate (Codeine Sulfate) 2)  ! Avalide (Irbesartan-Hydrochlorothiazide) 3)  ! * H2 Blocker 4)  ! Erythromycin Base (Erythromycin Base) 5)  ! * Latex  Past History:  Past Medical History: Reviewed history from 04/07/2009 and no changes required. Allergic Rhinitis Hyperlipidemia Hypertension AHI 12.2 5/06 on CPAP +10 cm fibromyalgia Chronic bronchitis diabetes mellitus type ll periodic headaches/migraines IBS Osteoporosis Bilateral breast pain Postmenopausal Sleep Apnea Partial small bowel obsruction Schatki ring Reflux Herniated disk at C7  Past Surgical History: Reviewed history from 04/07/2009 and no changes required.  T A H and B S O Tonsillectomy  1959 Hysterectomy  1979 Reduction mammoplasty Skin BCE removal  Review of Systems       The patient complains of anorexia, fever, hoarseness, and prolonged cough.  The patient denies weight loss, weight gain, vision loss, decreased hearing, chest  pain, syncope, dyspnea on exertion, peripheral edema, headaches, hemoptysis, abdominal pain, melena, hematochezia, severe indigestion/heartburn, hematuria, incontinence, genital sores, muscle weakness, suspicious skin lesions, transient blindness, difficulty walking, depression, unusual weight change, abnormal bleeding, enlarged lymph nodes, angioedema, and breast masses.    Physical Exam  General:  overweight-appearing.  no distress.  Frequent paroxysms of coughing, normal.  Blood pressure Head:  Normocephalic and atraumatic without obvious abnormalities. No apparent alopecia or balding. Eyes:  No corneal or conjunctival inflammation noted. EOMI. Perrla. Funduscopic exam benign, without hemorrhages, exudates or papilledema. Vision grossly normal. Ears:  External ear exam shows no significant lesions or deformities.  Otoscopic examination reveals clear canals, tympanic membranes are intact bilaterally without bulging, retraction, inflammation or discharge. Hearing is grossly normal bilaterally. Mouth:  pharyngeal erythema.   Neck:  No deformities, masses, or tenderness noted. Lungs:  Normal respiratory effort, chest expands symmetrically. Lungs are clear to auscultation, no crackles or wheezes.normal respiratory effort and no intercostal retractions.   Heart:  Normal rate and regular rhythm. S1 and S2 normal without gallop, murmur, click, rub or other extra sounds. Abdomen:  Bowel sounds positive,abdomen soft and non-tender without masses, organomegaly or hernias noted. Msk:  No deformity or scoliosis noted of thoracic or lumbar spine.   Extremities:  No clubbing, cyanosis, edema, or deformity noted with normal full range of motion of all joints.     Impression & Recommendations:  Problem # 1:  URI (ICD-465.9)  Her updated medication list for this problem includes:    Hydrocodone-homatropine 5-1.5 Mg/81m Syrp (Hydrocodone-homatropine) ..Marland Kitchen.. 1 teaspoon every 6 hours as needed  for cough will  treat symptomatically.  The patient requested a Z-Pak.  She was told that this almost certain represents a viral illness.  A prescription was dispensed, but she will wait several days before considering doing a medication  Problem # 2:  HYPERTENSION (ICD-401.9)  Her updated medication list for this problem includes:    Hyzaar 100-25 Mg Tabs (Losartan potassium-hctz) ..... One by mouth once daily  Problem # 3:  HYPERLIPIDEMIA (ICD-272.4)  Her updated medication list for this problem includes:    Crestor 10 Mg Tabs (Rosuvastatin calcium) ..... One daily  Complete Medication List: 1)  Crestor 10 Mg Tabs (Rosuvastatin calcium) .... One daily 2)  Lexapro 10 Mg Tabs (Escitalopram oxalate) .... Once daily 3)  Metformin Hcl 1000 Mg Tabs (Metformin hcl) .... One by mouth two times a day 4)  Hyzaar 100-25 Mg Tabs (Losartan potassium-hctz) .... One by mouth once daily 5)  Bd Ultra-fine Lll Mini Pen Needle 3/16", 31 Gauge  .... One two times a day 6)  Susternex Probiotic  .... Take 1 tablet by mouth once a day 7)  Lantus Solostar 100 Unit/ml Soln (Insulin glargine) .... 54 units qam as directed 8)  Bd Pen Needle Ultrafine 29g X 12.89m Misc (Insulin pen needle) .... Use daily as directed 9)  Hydrocodone-homatropine 5-1.5 Mg/513mSyrp (Hydrocodone-homatropine) ...Marland Kitchen 1 teaspoon every 6 hours as needed for cough 10)  Azithromycin 250 Mg Tabs (Azithromycin) .... Two initially, then one daily for 4 additional days  Patient Instructions: 1)  Acute bronchitis symptoms for less than 10 days are not helped by antibiotics. take over the counter cough medications. call if no improvment in  5-7 days, sooner if increasing cough, fever, or new symptoms( shortness of breath, chest pain). Prescriptions: HYDROCODONE-HOMATROPINE 5-1.5 MG/5ML SYRP (HYDROCODONE-HOMATROPINE) 1 teaspoon every 6 hours as needed for cough  #6 oz x 0   Entered and Authorized by:   PeMarletta LorMD   Signed by:   PeMarletta LorMD  on 11/02/2010   Method used:   Print then Give to Patient   RxID:   [id number] 250 MG TABS (AZITHROMYCIN) two initially, then one daily for 4 additional days  #5 x 0   Entered and Authorized by:   PeMarletta LorMD   Signed by:   PeMarletta LorMD on 11/02/2010   Method used:   Print then Give to Patient   RxID:   [id number]  Orders Added: 1)  Est. Patient Level III [9[20813]

## 2011-01-04 NOTE — Assessment & Plan Note (Signed)
Summary: INSULIN CONSULT // RS----PT [facility] // RS  pt rsc/njr   Vital Signs:  Patient profile:   60 year old female Menstrual status:  hysterectomy Weight:      179 pounds O2 Sat:      98 % Temp:     98.2 degrees F oral Pulse rate:   98 / minute Pulse rhythm:   regular Resp:     16 per minute BP sitting:   151 / 88  Vitals Entered By: Deanna Artis CMA (August 23, 2010 8:46 AM)  History of Present Illness: Patient seen for follow up type 2 diabetes. History of poor control. Last A1c 9.5%. Patient takes Victoza and metformin. CBGs vary Considerably.  no recent hypoglycemic symptoms. Does have night sweats fairly often.  Some urine frequency.      This is a 60 year old woman who presents with Type 2 diabetes mellitus follow-up.  The patient denies blurred vision, weight gain, and numbness of extremities.  The patient denies the following symptoms: neuropathic pain, chest pain, and orthostatic symptoms.  Since the last visit the patient reports compliance with medications and monitoring blood glucose.  The patient has been measuring capillary blood glucose before breakfast.  Since the last visit, the patient reports having had eye care by an ophthalmologist.    Hyperlipidemia.  Has some muscle fatigue and myalgias which she thinks might be related to Lipitor.  No  prior use of other statins.  No hx of CAD.  Hx elev. liver transaminases.-mild and felt sec to fatty liver changes.  Needs repeat.  Current Medications (verified): 1)  Lipitor 10 Mg  Tabs (Atorvastatin Calcium) .... Once Daily 2)  Lexapro 10 Mg  Tabs (Escitalopram Oxalate) .... Once Daily 3)  Metformin Hcl 1000 Mg Tabs (Metformin Hcl) .... One By Mouth Two Times A Day 4)  Losartan Potassium-Hctz 100-25 Mg Tabs (Losartan Potassium-Hctz) .... One By Mouth Once Daily 5)  Bd Ultra-Fine Lll Mini Pen Needle 3/16", 31 Gauge .... One Two Times A Day 6)  Victoza 18 Mg/30m Soln (Liraglutide) .... Inject 1.2 Mg Daily 7)  Susternex  Probiotic .... Take 1 Tablet By Mouth Once A Day  Allergies (verified): 1)  ! Codeine Sulfate (Codeine Sulfate) 2)  ! Avalide (Irbesartan-Hydrochlorothiazide) 3)  ! * H2 Blocker 4)  ! Erythromycin Base (Erythromycin Base) 5)  ! * Latex  Past History:  Past Medical History: Last updated: 04/07/2009 Allergic Rhinitis Hyperlipidemia Hypertension AHI 12.2 5/06 on CPAP +10 cm fibromyalgia Chronic bronchitis diabetes mellitus type ll periodic headaches/migraines IBS Osteoporosis Bilateral breast pain Postmenopausal Sleep Apnea Partial small bowel obsruction Schatki ring Reflux Herniated disk at C7 PMH reviewed for relevance  Review of Systems      See HPI  Physical Exam  General:  Well-developed,well-nourished,in no acute distress; alert,appropriate and cooperative throughout examination Mouth:  Oral mucosa and oropharynx without lesions or exudates.  Teeth in good repair. Neck:  No deformities, masses, or tenderness noted. Lungs:  Normal respiratory effort, chest expands symmetrically. Lungs are clear to auscultation, no crackles or wheezes. Heart:  normal rate and regular rhythm.   Extremities:  no edema.   Impression & Recommendations:  Problem # 1:  DIABETES MELLITUS, UNCONTROLLED (ICD-250.02) Long discussion about use of insulin.  Suspect she  has signif decline in beta cell fxn at this time.  Repeat A1C and if not making progress, will initiate insulin. Her updated medication list for this problem includes:    Metformin Hcl 1000 Mg Tabs (Metformin  hcl) ..... One by mouth two times a day    Losartan Potassium-hctz 100-25 Mg Tabs (Losartan potassium-hctz) ..... One by mouth once daily    Victoza 18 Mg/10m Soln (Liraglutide) ..... Inject 1.2 mg daily  Orders: Venipuncture ((67737 Specimen Handling (99000) TLB-A1C / Hgb A1C (Glycohemoglobin) (83036-A1C)  Problem # 2:  HYPERLIPIDEMIA (ICD-272.4) pt would like to explore other options for treatment.   Myalgias  and muscle fatigue possibly related to Lipitor. Her updated medication list for this problem includes:    Lipitor 10 Mg Tabs (Atorvastatin calcium) ..... Once daily  Orders: Venipuncture ((36681 Specimen Handling (99000) TLB-Lipid Panel (80061-LIPID) TLB-Hepatic/Liver Function Pnl (80076-HEPATIC)  Problem # 3:  TRANSAMINASES, SERUM, ELEVATED (ICD-790.4) Prob sec to fatty liver changes.  Repeat.  Complete Medication List: 1)  Lipitor 10 Mg Tabs (Atorvastatin calcium) .... Once daily 2)  Lexapro 10 Mg Tabs (Escitalopram oxalate) .... Once daily 3)  Metformin Hcl 1000 Mg Tabs (Metformin hcl) .... One by mouth two times a day 4)  Losartan Potassium-hctz 100-25 Mg Tabs (Losartan potassium-hctz) .... One by mouth once daily 5)  Bd Ultra-fine Lll Mini Pen Needle 3/16", 31 Gauge  .... One two times a day 6)  Victoza 18 Mg/359mSoln (Liraglutide) .... Inject 1.2 mg daily 7)  Susternex Probiotic  .... Take 1 tablet by mouth once a day  Patient Instructions: 1)  Please schedule a follow-up appointment in 3 months .  2)  It is important that you exercise reguarly at least 20 minutes 5 times a week. If you develop chest pain, have severe difficulty breathing, or feel very tired, stop exercising immediately and seek medical attention.  3)  You need to lose weight. Consider a lower calorie diet and regular exercise.

## 2011-01-04 NOTE — Progress Notes (Signed)
Summary: refill request  Phone Note Refill Request Message from:  Fax from Pharmacy on June 17, 2010 5:22 PM  Refills Requested: Medication #1:  VICTOZA 18 MG/3ML SOLN inject 0.57m/day for 7 days Initial call taken by: RWestley HummerCMA (Deborra Medina,  June 17, 2010 5:22 PM    Prescriptions: VICTOZA 18 MG/3ML SOLN (LIRAGLUTIDE) inject 0.638mday for 7 days, then increase to 1.63m83may  #6 Not Specif x 5   Entered by:   RacWestley HummerA (AAMAurelia Authorized by:   BruCarolann Littler   Signed by:   RacWestley HummerA (AAMWhitesboron 06/17/2010   Method used:   Electronically to        [facility] [place])       [id number]     [place]  [id number]    Ph: [phone]    Fax: [phone]:   [id number]

## 2011-01-04 NOTE — Assessment & Plan Note (Signed)
Summary: 3 month rov/njr   Vital Signs:  Patient profile:   60 year old female Menstrual status:  hysterectomy Weight:      184 pounds BMI:     33.24 Temp:     98.9 degrees F oral BP sitting:   140 / 90  (left arm) Cuff size:   regular  Vitals Entered By: Nira Conn LPN (January 08, 5851 8:31 AM)  Nutrition Counseling: Patient's BMI is greater than 25 and therefore counseled on weight management options. CC: 3 month follow-up   History of Present Illness: Followup type 2 diabetes. Added Victoza recently. No nausea or any other side effects. Blood sugar slightly improved but not optimal. Last A1c 9.3%.  some early satiety with eating has lost a couple pounds.  Diabetes Management History:      She has not been enrolled in the "Diabetic Education Program".  She states understanding of dietary principles and is following her diet appropriately.  No sensory loss is reported.  Self foot exams are being performed.  She is checking home blood sugars.  She says that she is not exercising regularly.        Hypoglycemic symptoms are not occurring.  No hyperglycemic symptoms are reported.        There are no symptoms to suggest diabetic complications.  The following changes have been made to her treatment plan since last visit: medication changes.    Preventive Screening-Counseling & Management  Alcohol-Tobacco     Smoking Status: never  Caffeine-Diet-Exercise     Does Patient Exercise: no  Allergies: 1)  ! Codeine Sulfate (Codeine Sulfate) 2)  ! Lipitor (Atorvastatin Calcium) 3)  ! Avalide (Irbesartan-Hydrochlorothiazide) 4)  ! Lexapro (Escitalopram Oxalate) 5)  ! * H2 Blocker 6)  ! Erythromycin Base (Erythromycin Base) 7)  ! * Latex  Past History:  Past Medical History: Last updated: 04/07/2009 Allergic Rhinitis Hyperlipidemia Hypertension AHI 12.2 5/06 on CPAP +10 cm fibromyalgia Chronic bronchitis diabetes mellitus type ll periodic  headaches/migraines IBS Osteoporosis Bilateral breast pain Postmenopausal Sleep Apnea Partial small bowel obsruction Schatki ring Reflux Herniated disk at C7 PMH reviewed for relevance  Social History: Smoking Status:  never Does Patient Exercise:  no  Review of Systems      See HPI  Physical Exam  General:  Well-developed,well-nourished,in no acute distress; alert,appropriate and cooperative throughout examination Mouth:  Oral mucosa and oropharynx without lesions or exudates.  Teeth in good repair. Neck:  No deformities, masses, or tenderness noted. Lungs:  Normal respiratory effort, chest expands symmetrically. Lungs are clear to auscultation, no crackles or wheezes. Heart:  Normal rate and regular rhythm. S1 and S2 normal without gallop, murmur, click, rub or other extra sounds.  Diabetes Management Exam:    Eye Exam:       Eye Exam done elsewhere          Date: 05/05/2009          Done by: Dr Louretta Shorten   Impression & Recommendations:  Problem # 1:  DIABETES MELLITUS, UNCONTROLLED (ICD-250.02) History of poor control. Continue weight loss efforts. Reassess A1c and urine microalbumin. Reminder for eye exam in June. The following medications were removed from the medication list:    Byetta 10 Mcg Pen 10 Mcg/0.36m Soln (Exenatide) ..Marland KitchenMarland KitchenMarland KitchenMarland Kitchen10 micrograms two times a day Her updated medication list for this problem includes:    Metformin Hcl 500 Mg Tabs (Metformin hcl) ..Marland Kitchen..Marland KitchenTwo tabs two times a day    Hyzaar 50-12.5 Mg Tabs (  Losartan potassium-hctz) ..... One by mouth once daily    Victoza 18 Mg/31m Soln (Liraglutide) ..... Inject 0.671mday for 7 days, then increase to 1.34m89may  Orders: TLB-A1C / Hgb A1C (Glycohemoglobin) (83036-A1C) TLB-Microalbumin/Creat Ratio, Urine (82043-MALB)  Complete Medication List: 1)  Lipitor 10 Mg Tabs (Atorvastatin calcium) .... Once daily 2)  Lexapro 10 Mg Tabs (Escitalopram oxalate) .... Once daily 3)  Metformin Hcl 500 Mg Tabs (Metformin  hcl) .... Two tabs two times a day 4)  Hyzaar 50-12.5 Mg Tabs (Losartan potassium-hctz) .... One by mouth once daily 5)  Bd Ultra-fine Lll Mini Pen Needle 3/16", 31 Gauge  .... One two times a day 6)  Victoza 18 Mg/3ml71mln (Liraglutide) .... Inject 0.6mg/334m for 7 days, then increase to 1.34mg/d48m Patient Instructions: 1)  It is important that you exercise reguarly at least 20 minutes 5 times a week. If you develop chest pain, have severe difficulty breathing, or feel very tired, stop exercising immediately and seek medical attention.  2)  You need to lose weight. Consider a lower calorie diet and regular exercise.  3)  It is important that your diabetic A1c level is checked every 3 months.  4)  See your eye doctor yearly to check for diabetic eye damage. 5)  Check your feet each night  for sore areas, calluses or signs of infection.  6)  Please schedule a follow-up appointment in 3 months .      Laboratory Results   Urine Tests    Routine Urinalysis   Color: yellow Appearance: Clear Glucose: negative   (Normal Range: Negative) Bilirubin: negative   (Normal Range: Negative) Ketone: negative   (Normal Range: Negative) Spec. Gravity: 1.015   (Normal Range: 1.003-1.035) Blood: negative   (Normal Range: Negative) pH: 7.0   (Normal Range: 5.0-8.0) Protein: negative   (Normal Range: Negative) Urobilinogen: 1.0   (Normal Range: 0-1) Nitrite: negative   (Normal Range: Negative) Leukocyte Esterace: negative   (Normal Range: Negative)    Comments: Gwenn Joyce Grossuary  4, 2011 9:43 AM

## 2011-01-04 NOTE — Assessment & Plan Note (Signed)
Summary: consult re: hot flashes/pt req to get a1c checked/cjr   Vital Signs:  Patient profile:   60 year old female Menstrual status:  hysterectomy Weight:      184 pounds Temp:     97.7 degrees F oral BP sitting:   150 / 90  (left arm) Cuff size:   regular  Vitals Entered By: Nira Conn LPN (May 10, 5642 3:29 AM) CC: discuss BP med, hot flashes , Hypertension Management Is Patient Diabetic? Yes Did you bring your meter with you today? No CBG Result 209   History of Present Illness: Recent increase in hot flashes over past 6 weeks.  Typical sxs as menopause. Pt previously on estrogen but does not wish to start back.  Takes Lexapro but not helping.  Denies weight loss, tremor, diarrhea, palpitations.  Recent GERD symptoms.  Has started Prilosec which has helped.  Caffeine reduced.  Worse with spicy and acidic foods.  Type 2 diabetes which had been poorly controlled.  Recent addition of Victoza but no weight loss.  Does have some early satiety, as expected.  hypertension on Losartan hctz.  Recent home BPs around 150/90.  Hypertension History:      She denies headache, chest pain, palpitations, dyspnea with exertion, orthopnea, PND, peripheral edema, neurologic problems, syncope, and side effects from treatment.        Positive major cardiovascular risk factors include female age 13 years old or older, diabetes, hyperlipidemia, and hypertension.  Negative major cardiovascular risk factors include non-tobacco-user status.     Preventive Screening-Counseling & Management  Alcohol-Tobacco     Smoking Status: never  Allergies: 1)  ! Codeine Sulfate (Codeine Sulfate) 2)  ! Lipitor (Atorvastatin Calcium) 3)  ! Avalide (Irbesartan-Hydrochlorothiazide) 4)  ! Lexapro (Escitalopram Oxalate) 5)  ! * H2 Blocker 6)  ! Erythromycin Base (Erythromycin Base) 7)  ! * Latex  Past History:  Past Medical History: Last updated: 04/07/2009 Allergic  Rhinitis Hyperlipidemia Hypertension AHI 12.2 5/06 on CPAP +10 cm fibromyalgia Chronic bronchitis diabetes mellitus type ll periodic headaches/migraines IBS Osteoporosis Bilateral breast pain Postmenopausal Sleep Apnea Partial small bowel obsruction Schatki ring Reflux Herniated disk at C7  Past Surgical History: Last updated: 04/07/2009  T A [facility] and B S O Tonsillectomy  1959 Hysterectomy  1979 Reduction mammoplasty Skin BCE removal  Family History: Last updated: 04/07/2009 Family History Diabetes 1st degree relative  mother Family History of Alcoholism/Addiction father Family History of Arthritis both parents Breast CA, mother Stroke Family History Hypertension PMH reviewed for relevance, FH reviewed for relevance  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    Physical Exam  General:  Well-developed,well-nourished,in no acute distress; alert,appropriate and cooperative throughout examination Lungs:  Normal respiratory effort, chest expands symmetrically. Lungs are clear to auscultation, no crackles or wheezes. Heart:  normal rate and regular rhythm.   Extremities:  no edema. Neurologic:  alert & oriented X3 and cranial nerves II-XII intact.   Skin:  no rashes.   Psych:  normally interactive, good eye contact, not anxious appearing, and not depressed appearing.     Impression & Recommendations:  Problem # 1:  HOT FLASHES (ICD-627.2) Assessment New Discussed multiple possible rx options.  She is not a good candidate to go back on estrogen.  Other options such as clonidine, Effexor, gabapentin discussed and she wishes to  observe off meds at this time.  Problem # 2:  DIABETES MELLITUS, UNCONTROLLED (ICD-250.02) Assessment: Deteriorated recheck A1C. Her updated medication  list for this problem includes:    Metformin Hcl 1000 Mg Tabs (Metformin hcl) ..... One by mouth two times a day    Losartan Potassium-hctz 100-25 Mg Tabs (Losartan potassium-hctz) ..... One by mouth once daily    Victoza 18 Mg/29m Soln (Liraglutide) ..... Inject 0.658mday for 7 days, then increase to 1.9m58may  Orders: TLB-A1C / Hgb A1C (Glycohemoglobin) (83036-A1C) Venipuncture ([phone] # 3:  HYPERTENSION (ICD-401.9) Assessment: Deteriorated Not to goal..  titrate losartan. Her updated medication list for this problem includes:    Losartan Potassium-hctz 100-25 Mg Tabs (Losartan potassium-hctz) ..... One by mouth once daily  Problem # 4:  GERD (ICD-530.81) Assessment: New cont prilosec.  Handout given with discussion of dietary factors and need to lose weight.  Complete Medication List: 1)  Lipitor 10 Mg Tabs (Atorvastatin calcium) .... Once daily 2)  Lexapro 10 Mg Tabs (Escitalopram oxalate) .... Once daily 3)  Metformin Hcl 1000 Mg Tabs (Metformin hcl) .... One by mouth two times a day 4)  Losartan Potassium-hctz 100-25 Mg Tabs (Losartan potassium-hctz) .... One by mouth once daily 5)  Bd Ultra-fine Lll Mini Pen Needle 3/16", 31 Gauge  .... One two times a day 6)  Victoza 18 Mg/3ml21mln (Liraglutide) .... Inject 0.6mg/85m for 7 days, then increase to 1.9mg/d68m Other Orders: Capillary Blood Glucose/CBG (82948(93235ertension Assessment/Plan:      The patient's hypertensive risk group is category C: Target organ damage and/or diabetes.  Her calculated 10 year risk of coronary heart disease is 24 %.  Today's blood pressure is 150/90.    Patient Instructions: 1)  Please schedule a follow-up appointment in 3 months .  2)  It is important that you exercise reguarly at least 20 minutes 5 times a week. If you develop chest pain, have severe difficulty breathing, or feel very tired, stop exercising immediately and seek medical attention.  3)  You need to lose weight. Consider a  lower calorie diet and regular exercise.  Prescriptions: LOSARTAN POTASSIUM-HCTZ 100-25 MG TABS (LOSARTAN POTASSIUM-HCTZ) one by mouth once daily  #30 x 11   Entered and Authorized by:    Carolann LittlerSigned by:    Carolann Littler 05/10/2010   Method used:   Electronically to        [facility] [address] ([place])       [address]  [id number] Ph: [phone] Fax: [phone]:   [id number] HCL 1000 MG TABS (METFORMIN HCL) one by mouth two times a day  #60 x 11   Entered and Authorized by:    Carolann LittlerSigned by:    Carolann Littler 05/10/2010   Method used:   Electronically to        [facility] [address] ([place])       [address]  [place] Ph: [phone] Fax: [phone]: [phone]

## 2011-01-04 NOTE — Progress Notes (Signed)
Summary: call a nurse    [facility] Triage Call Report Triage Record Num: [id number] Operator: Leota Sauers Patient Name: Dana Dyer Call Date & Time: 08/15/2010 10:55:10AM Patient Phone: [phone] PCP: Carolann Littler Patient Gender: Female PCP Fax : [phone] Patient DOB: 02-Feb-1951 Practice Name: [facility] Reason for Call: Pt. calling. Afebrile/subjective. Reports cold sx. onset 9/4. "Horrible deep rooted cough." worse since 9/7. Advised ED per Cough rotocol. Caller insists that she got this from her grandchildren and she will not go to ED; states she will not go as spouse has similar sx. and sheis sure this was sometheing they caught while keeping grandchildren and she insists that there were hourly stops where they got out and walked the dog. . Now reports she has been having sweating episodes " all week; fever/ subjective. [facility] [address] [phone]. Dr. Madilyn Fireman on call; ordered that caller needs to be evaluated; may go to UC or follow up with office on 9/12. Caller informed of same; states plan to go to [facility]. Protocol(s) Used: Cough - Adult Recommended Outcome per Protocol: See ED Immediately Reason for Outcome: New onset or worsening cough AND recent (within 4 wks.) surgery or trauma, or prolonged immobilization (bedrest, long travel), or smoker taking medication with estrogen Care Advice:  ~ 09/

## 2011-01-06 NOTE — Assessment & Plan Note (Signed)
Summary: 2 month fup//ccm   Vital Signs:  Patient profile:   60 year old female Menstrual status:  hysterectomy Weight:      182 pounds Temp:     97.8 degrees F oral BP sitting:   140 / 84  (left arm) Cuff size:   regular  Vitals Entered By: Nira Conn LPN (December 09, 1153 8:29 AM)  History of Present Illness: Diabetes follow up. Lantus up to 52 units daily.  Fastings 115 to 130. NO hypoglycemia.  Overall feels better. Walking some but inconsistent.  Last A1c was over 10. She remains on metformin.  She had severe nausea with [place].  Crestor increased to 10 mg with no side effects.  Needs follow up lipids at this time.  Diabetes Management History:      She has not been enrolled in the "Diabetic Education Program".  She states understanding of dietary principles and is following her diet appropriately.  No sensory loss is reported.  Self foot exams are being performed.  She is checking home blood sugars.  She says that she is not exercising regularly.        No changes have been made to her treatment plan since last visit.    Hypertension History:      She denies headache, chest pain, palpitations, dyspnea with exertion, orthopnea, PND, peripheral edema, visual symptoms, neurologic problems, syncope, and side effects from treatment.        Positive major cardiovascular risk factors include female age 78 years old or older, diabetes, hyperlipidemia, and hypertension.  Negative major cardiovascular risk factors include non-tobacco-user status.        Further assessment for target organ damage reveals no history of ASHD, stroke/TIA, or peripheral vascular disease.    Lipid Management History:      Positive NCEP/ATP III risk factors include female age 20 years old or older, diabetes, and hypertension.  Negative NCEP/ATP III risk factors include HDL cholesterol greater than 60, non-tobacco-user status, no ASHD (atherosclerotic heart disease), no prior stroke/TIA, no peripheral  vascular disease, and no history of aortic aneurysm.       Allergies: 1)  ! Codeine Sulfate (Codeine Sulfate) 2)  ! Avalide (Irbesartan-Hydrochlorothiazide) 3)  ! * H2 Blocker 4)  ! Erythromycin Base (Erythromycin Base) 5)  ! * Latex  Past History:  Past Medical History: Last updated: 04/07/2009 Allergic Rhinitis Hyperlipidemia Hypertension AHI 12.2 5/06 on CPAP +10 cm fibromyalgia Chronic bronchitis diabetes mellitus type ll periodic headaches/migraines IBS Osteoporosis Bilateral breast pain Postmenopausal Sleep Apnea Partial small bowel obsruction Schatki ring Reflux Herniated disk at C7  Past Surgical History: Last updated: 04/07/2009  T A H and B S O Tonsillectomy  1959 Hysterectomy  1979 Reduction mammoplasty Skin BCE removal  Family History: Last updated: 04/07/2009 Family History Diabetes 1st degree relative  mother Family History of Alcoholism/Addiction father Family History of Arthritis both parents Breast CA, mother Stroke Family History Hypertension  Social History: 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    Physical Exam  General:  Well-developed,well-nourished,in no acute distress; alert,appropriate and cooperative throughout examination Ears:  External ear exam shows no significant lesions or deformities.  Otoscopic examination reveals clear canals, tympanic membranes  are intact bilaterally without bulging, retraction, inflammation or discharge. Hearing is grossly normal bilaterally. Mouth:  Oral mucosa and oropharynx without lesions or  exudates.  Teeth in good repair. Neck:  No deformities, masses, or tenderness noted. Lungs:  Normal respiratory effort, chest expands symmetrically. Lungs are clear to auscultation, no crackles or wheezes. Heart:  Normal rate and regular rhythm. S1 and S2 normal without gallop, murmur, click, rub or other extra sounds. Extremities:  no edema.  Diabetes Management Exam:    Foot Exam (with socks and/or shoes not present):       Sensory-Pinprick/Light touch:          Left medial foot (L-4): normal          Left dorsal foot (L-5): normal          Left lateral foot (S-1): normal          Right medial foot (L-4): normal          Right dorsal foot (L-5): normal          Right lateral foot (S-1): normal       Sensory-Monofilament:          Left foot: normal          Right foot: normal       Inspection:          Left foot: normal          Right foot: normal       Nails:          Left foot: normal          Right foot: normal   Impression & Recommendations:  Problem # 1:  DIABETES MELLITUS, UNCONTROLLED (ICD-250.02) Assessment Improved  Her updated medication list for this problem includes:    Metformin Hcl 1000 Mg Tabs (Metformin hcl) ..... One by mouth two times a day    Hyzaar 100-25 Mg Tabs (Losartan potassium-hctz) ..... One by mouth once daily    Lantus Solostar 100 Unit/ml Soln (Insulin glargine) .Marland Kitchen... 54 units qam as directed  Orders: Specimen Handling (99000) Venipuncture (41660) TLB-A1C / Hgb A1C (Glycohemoglobin) (83036-A1C)  Problem # 2:  HYPERLIPIDEMIA (ICD-272.4)  Her updated medication list for this problem includes:    Crestor 10 Mg Tabs (Rosuvastatin calcium) ..... One daily  Orders: Specimen Handling (99000) Venipuncture (63016) TLB-Lipid Panel (80061-LIPID) TLB-Hepatic/Liver Function Pnl (80076-HEPATIC)  Problem # 3:  HYPERTENSION (ICD-401.9)  Her updated medication list for this problem includes:    Hyzaar 100-25 Mg Tabs (Losartan potassium-hctz) .....  One by mouth once daily  Complete Medication List: 1)  Crestor 10 Mg Tabs (Rosuvastatin calcium) .... One daily 2)  Lexapro 10 Mg Tabs (Escitalopram oxalate) .... Once daily 3)  Metformin Hcl 1000 Mg Tabs (Metformin hcl) .... One by mouth two times a day 4)  Hyzaar 100-25 Mg Tabs (Losartan potassium-hctz) .... One by mouth once daily 5)  Bd Ultra-fine Lll Mini Pen Needle 3/16", 31 Gauge  .... One two times a day 6)  Susternex Probiotic  .... Take 1 tablet by mouth once a day 7)  Lantus Solostar 100 Unit/ml Soln (Insulin glargine) .... 54 units qam as directed 8)  Bd Pen Needle Ultrafine 29g X 12.22m Misc (Insulin pen needle) .... Use daily as directed  Diabetes Management Assessment/Plan:      The following lipid goals have been established for the patient: Total cholesterol goal of 200; LDL cholesterol goal of 100; HDL cholesterol goal of 40; Triglyceride goal of 150.  Hypertension Assessment/Plan:      The patient's hypertensive risk group is category C: Target organ damage and/or diabetes.  Her calculated 10 year risk of coronary heart disease is 17 %.  Today's blood pressure is 140/84.    Lipid Assessment/Plan:      Based on NCEP/ATP III, the patient's risk factor category is "history of diabetes".  The patient's lipid goals are as follows: Total cholesterol goal is 200; LDL cholesterol goal is 100; HDL cholesterol goal is 40; Triglyceride goal is 150.    Patient Instructions: 1)  Please schedule a follow-up appointment in 3 months .    Orders Added: 1)  Est. Patient Level IV [35686] 2)  Specimen Handling [99000] 3)  Venipuncture [16837] 4)  TLB-Lipid Panel [80061-LIPID] 5)  TLB-Hepatic/Liver Function Pnl [80076-HEPATIC] 6)  TLB-A1C / Hgb A1C (Glycohemoglobin) [83036-A1C]

## 2011-01-18 ENCOUNTER — Other Ambulatory Visit: Payer: Self-pay | Admitting: Dermatology

## 2011-03-05 ENCOUNTER — Other Ambulatory Visit: Payer: Self-pay | Admitting: Family Medicine

## 2011-03-08 ENCOUNTER — Encounter: Payer: Self-pay | Admitting: Family Medicine

## 2011-03-10 ENCOUNTER — Encounter: Payer: Self-pay | Admitting: Family Medicine

## 2011-03-10 ENCOUNTER — Ambulatory Visit (INDEPENDENT_AMBULATORY_CARE_PROVIDER_SITE_OTHER): Payer: BC Managed Care – PPO | Admitting: Family Medicine

## 2011-03-10 VITALS — BP 130/88 | Temp 98.1°F | Ht 62.25 in | Wt 182.0 lb

## 2011-03-10 DIAGNOSIS — IMO0001 Reserved for inherently not codable concepts without codable children: Secondary | ICD-10-CM

## 2011-03-10 DIAGNOSIS — E785 Hyperlipidemia, unspecified: Secondary | ICD-10-CM

## 2011-03-10 MED ORDER — ROSUVASTATIN CALCIUM 10 MG PO TABS: 10.0000 mg | ORAL_TABLET | Freq: Every day | ORAL | Status: DC

## 2011-03-10 NOTE — Progress Notes (Signed)
  Subjective:    Patient ID: Dana Dyer, female    DOB: [birth year], 60 y.o.   MRN: [id number]  HPI Patient seen for medical followup. History of type 2 diabetes, hyperlipidemia, obstructive sleep apnea, and hypertension. Diabetes and poorly controlled but improving on Lantus in combination with Avandamet. No hypoglycemic symptoms. Overall feels well. No hyperglycemic symptoms. Fasting blood sugars run 130 which are greatly improved.  Recent lipids at goal. No myalgias. Needs refills Crestor today.   Review of Systems  Constitutional: Negative for fatigue.  Respiratory: Negative for shortness of breath, wheezing and stridor.   Cardiovascular: Negative for chest pain, palpitations and leg swelling.  Gastrointestinal: Negative for abdominal pain.  Skin: Negative for rash.  Neurological: Negative for syncope and headaches.       Objective:   Physical Exam  Constitutional: She is oriented to person, place, and time. She appears well-developed and well-nourished.  HENT:  Mouth/Throat: No oropharyngeal exudate.  Eyes: Pupils are equal, round, and reactive to light.  Neck: Neck supple.  Cardiovascular: Normal rate, regular rhythm and normal heart sounds.   No murmur heard. Pulmonary/Chest: Effort normal and breath sounds normal. She has no wheezes. She has no rales.  Musculoskeletal: She exhibits no edema.  Lymphadenopathy:    She has no cervical adenopathy.  Neurological: She is alert and oriented to person, place, and time.          Assessment & Plan:  #1 type 2 diabetes with history of suboptimal control. Recheck A1c #2 hyperlipidemia at goal.

## 2011-03-11 ENCOUNTER — Encounter: Payer: Self-pay | Admitting: Family Medicine

## 2011-03-14 NOTE — Progress Notes (Signed)
Quick Note:  Pt informed on personally identified home VM ______

## 2011-04-22 NOTE — Assessment & Plan Note (Signed)
[facility]                             PULMONARY OFFICE NOTE   NAME:Dyer, Dana GODETTE                      MRN:          [id number]  DATE:01/05/2007                            DOB:          1951/02/15    HISTORY OF PRESENT ILLNESS:  The patient is a 60 year old white female  patient of Dr. Patsey Berthold who has a known history of allergic rhinitis and  obstructive sleep apnea, who presents for an acute office visit.  Patient complains of a 2-day history of cough, nasal congestion and  fever and chills.  Patient denies any purulent sputum, chest pain or  shortness of breath.   PAST MEDICAL HISTORY:  Reviewed.   CURRENT MEDICATIONS:  Reviewed.   PHYSICAL EXAMINATION:  Patient is a pleasant female, in no acute  distress.  She is afebrile with stable vital signs.  Her 02 saturation  is 96% on room air.  HEENT:  Nasal mucosa is slightly pale.  Non-tender sinuses.  Posterior  pharynx is clear.  NECK:  Supple without adenopathy.  LUNG SOUNDS:  Reveal a few scattered expiratory wheezes.  CARDIAC:  Regular rate and rhythm.  ABDOMEN:  Soft and benign.  EXTREMITIES:  Warm without any edema.   IMPRESSION AND PLAN:  Acute rhinitis and asthma flare.  Patient is to  begin Mucinex DM twice daily.  May use Zyrtec 10 mg at bedtime as needed  for postnasal drip.  Tylenol as needed for fever and chills.  Patient  was recommended to take a prednisone taper.  However, patient reports  that she is very intolerable to prednisone secondary to nervousness.  Have suggested that she take prednisone 40 mg over the next 2 days since  she is leaving on a bush[place] trip next [place].  Patient was given a  Xopenex nebulizer treatment in the office.  Patient will return back  with Dr. Patsey Berthold in 1 month or sooner if needed.      Rexene Edison, NP  Electronically Signed      Renold Don, MD  Electronically Signed   TP/MedQ  DD: 01/09/2007  DT: 01/09/2007  Job #:  [id number]

## 2011-04-22 NOTE — Assessment & Plan Note (Signed)
[facility]                             PULMONARY OFFICE NOTE   NAME:Dana Dyer, Dana Dyer                      MRN:          [id number]  DATE:11/07/2006                            DOB:          [birth year]    HISTORY OF PRESENT ILLNESS:  This is a 60 year old white female who  presents for follow up on obstructive sleep apnea.  The patient is  maintained on CPAP at 8 cm of water pressure.  The patient is currently  using a Hybrid mask.   The patient presents today having difficulties with her mask.  She has  had some issues with leakage.  When this occurs, she awakens with a  headache.  When she has no leaks with her masks, she can sleep an entire  night without difficulty.   The patient denies any daytime somnolence.  However, she is performing  activities of daily living without difficulty.   CURRENT MEDICATIONS:  As noted on the intake sheet.  These have been  reviewed and are accurate.   PHYSICAL EXAMINATION:  VITAL SIGNS:  Oxygen saturation was 95% on room  air.  GENERAL APPEARANCE:  This is a well-developed, obese female who is in no  acute distress.  HEENT:  Remarkable for a class 3 airway.  NECK:  Supple.  No lymphadenopathy noted.  No JVD.  LUNGS:  Clear to auscultation bilaterally.  CARDIAC:  Regular rate and rhythm.  No murmurs, rubs or gallops.  EXTREMITIES:  No cyanosis, clubbing or edema.   IMPRESSION:  1. Obstructive sleep apnea.  The patient is having some mask      difficulties.  New mask will be ordered for her.  2. Continue CPAP assist.  3. The patient may require another Autoset titration study, but for      now, will continue with her pressure at 8 cm of water pressure.  4. Follow up will be in six months time.  She is to contact us prior      to that time should any problems arise.     Renold Don, MD  Electronically Signed    CLG/MedQ  DD: 11/07/2006  DT: 11/08/2006  Job #: [id number]

## 2011-05-13 ENCOUNTER — Other Ambulatory Visit: Payer: Self-pay | Admitting: Family Medicine

## 2011-05-27 ENCOUNTER — Other Ambulatory Visit: Payer: Self-pay | Admitting: Family Medicine

## 2011-06-25 ENCOUNTER — Other Ambulatory Visit: Payer: Self-pay | Admitting: Family Medicine

## 2011-07-09 ENCOUNTER — Other Ambulatory Visit: Payer: Self-pay | Admitting: Family Medicine

## 2011-08-01 ENCOUNTER — Other Ambulatory Visit: Payer: Self-pay | Admitting: *Deleted

## 2011-08-01 MED ORDER — INSULIN GLARGINE 100 UNIT/ML ~~LOC~~ SOLN: SUBCUTANEOUS | Status: DC

## 2011-08-16 ENCOUNTER — Other Ambulatory Visit: Payer: Self-pay | Admitting: Family Medicine

## 2011-08-23 ENCOUNTER — Ambulatory Visit (INDEPENDENT_AMBULATORY_CARE_PROVIDER_SITE_OTHER): Payer: BC Managed Care – PPO | Admitting: Family Medicine

## 2011-08-23 DIAGNOSIS — E785 Hyperlipidemia, unspecified: Secondary | ICD-10-CM

## 2011-08-23 DIAGNOSIS — E119 Type 2 diabetes mellitus without complications: Secondary | ICD-10-CM

## 2011-08-23 DIAGNOSIS — I10 Essential (primary) hypertension: Secondary | ICD-10-CM

## 2011-08-23 DIAGNOSIS — IMO0001 Reserved for inherently not codable concepts without codable children: Secondary | ICD-10-CM

## 2011-08-25 NOTE — Progress Notes (Signed)
Quick Note:  Pt informed, she is going to schedule 3 month F/U , copy mailed to pt home ______

## 2011-10-03 ENCOUNTER — Other Ambulatory Visit: Payer: Self-pay | Admitting: Family Medicine

## 2011-10-03 NOTE — Telephone Encounter (Signed)
Pt req refill of metFORMIN (GLUCOPHAGE) 1000 MG tablet and Insulin Pen Needle (BD ULTRA-FINE PEN NEEDLES) 29G X 12.7MM MISC to [facility] on [address]

## 2011-10-07 NOTE — Progress Notes (Signed)
System Downtime Recovery The EMR experienced a system downtime.  This downtime occurred on 08-23-2011. During this downtime paper charting was completed by the provider.  The visit was documented on paper during the downtime and will be scanned into [facility]/Epic, billing was completed by the [facility] Billing Department .  The visit is being closed on behalf of the provider.

## 2011-11-24 ENCOUNTER — Encounter: Payer: Self-pay | Admitting: Family Medicine

## 2011-12-09 ENCOUNTER — Other Ambulatory Visit: Payer: BC Managed Care – PPO

## 2011-12-12 ENCOUNTER — Other Ambulatory Visit (INDEPENDENT_AMBULATORY_CARE_PROVIDER_SITE_OTHER): Payer: BC Managed Care – PPO

## 2011-12-12 DIAGNOSIS — Z0000 Encounter for general adult medical examination without abnormal findings: Secondary | ICD-10-CM

## 2011-12-12 LAB — CBC WITH DIFFERENTIAL/PLATELET
Basophils Absolute: 0 10*3/uL (ref 0.0–0.1)
Eosinophils Absolute: 0.8 10*3/uL — ABNORMAL HIGH (ref 0.0–0.7)
HCT: 39.2 % (ref 36.0–46.0)
Lymphs Abs: 2.5 10*3/uL (ref 0.7–4.0)
MCHC: 33.9 g/dL (ref 30.0–36.0)
MCV: 88.9 fl (ref 78.0–100.0)
Monocytes Absolute: 0.6 10*3/uL (ref 0.1–1.0)
Neutrophils Relative %: 43.5 % (ref 43.0–77.0)
Platelets: 178 10*3/uL (ref 150.0–400.0)
RDW: 13.5 % (ref 11.5–14.6)
WBC: 6.9 10*3/uL (ref 4.5–10.5)

## 2011-12-12 LAB — POCT URINALYSIS DIPSTICK
Bilirubin, UA: NEGATIVE
Blood, UA: NEGATIVE
Glucose, UA: NEGATIVE
Nitrite, UA: NEGATIVE
Spec Grav, UA: 1.015
Urobilinogen, UA: 0.2

## 2011-12-12 LAB — BASIC METABOLIC PANEL
BUN: 13 mg/dL (ref 6–23)
Chloride: 102 mEq/L (ref 96–112)
Creatinine, Ser: 0.7 mg/dL (ref 0.4–1.2)
Glucose, Bld: 164 mg/dL — ABNORMAL HIGH (ref 70–99)
Potassium: 3.4 mEq/L — ABNORMAL LOW (ref 3.5–5.1)

## 2011-12-12 LAB — LIPID PANEL
Cholesterol: 151 mg/dL (ref 0–200)
LDL Cholesterol: 82 mg/dL (ref 0–99)
Triglycerides: 92 mg/dL (ref 0.0–149.0)
VLDL: 18.4 mg/dL (ref 0.0–40.0)

## 2011-12-12 LAB — TSH: TSH: 2.68 u[IU]/mL (ref 0.35–5.50)

## 2011-12-12 LAB — HEMOGLOBIN A1C: Hgb A1c MFr Bld: 10 % — ABNORMAL HIGH (ref 4.6–6.5)

## 2011-12-12 LAB — MICROALBUMIN / CREATININE URINE RATIO: Creatinine,U: 170.1 mg/dL

## 2011-12-13 LAB — HEPATIC FUNCTION PANEL
ALT: 63 U/L — ABNORMAL HIGH (ref 0–35)
Bilirubin, Direct: 0.2 mg/dL (ref 0.0–0.3)
Total Bilirubin: 1.3 mg/dL — ABNORMAL HIGH (ref 0.3–1.2)

## 2011-12-15 ENCOUNTER — Encounter: Payer: Self-pay | Admitting: Family Medicine

## 2011-12-15 ENCOUNTER — Ambulatory Visit (INDEPENDENT_AMBULATORY_CARE_PROVIDER_SITE_OTHER): Payer: BC Managed Care – PPO | Admitting: Family Medicine

## 2011-12-15 VITALS — BP 140/90 | HR 80 | Temp 98.4°F | Resp 12 | Ht 62.5 in | Wt 188.0 lb

## 2011-12-15 DIAGNOSIS — IMO0001 Reserved for inherently not codable concepts without codable children: Secondary | ICD-10-CM

## 2011-12-15 DIAGNOSIS — Z0000 Encounter for general adult medical examination without abnormal findings: Secondary | ICD-10-CM

## 2011-12-15 DIAGNOSIS — Z23 Encounter for immunization: Secondary | ICD-10-CM

## 2011-12-15 MED ORDER — TETANUS-DIPHTH-ACELL PERTUSSIS 5-2.5-18.5 LF-MCG/0.5 IM SUSP: 0.5000 mL | Freq: Once | INTRAMUSCULAR | Status: DC

## 2011-12-15 NOTE — Progress Notes (Signed)
Subjective:    Patient ID: Dana Dyer, female    DOB: 11-18-1951, 61 y.o.   MRN: [id number]  HPI Patient complete physical. She sees a gynecologist. She has history of type 2 diabetes, hyperlipidemia, obstructive sleep apnea, hypertension and chronic mild elevated liver transaminases. Poor compliance with diet though she has lost couple pounds last visit. She had poorly controlled diabetes for some time. On metformin and recent addition of Lantus. Not consistently measuring fasting sugars. Her fastings tend to run around 160-170. Other medications reviewed. Mood stable on Lexapro. Last tetanus 2000. No definite history of Pneumovax. No influenza vaccine yet. No history of shingles vaccine.  Past Medical History  Diagnosis Date  . DIABETES MELLITUS, UNCONTROLLED 03/16/2009  . HYPERLIPIDEMIA 10/09/2007  . OBSTRUCTIVE SLEEP APNEA 10/09/2007  . HYPERTENSION 10/09/2007  . ALLERGIC RHINITIS 10/09/2007  . FIBROMYALGIA 10/09/2007  . FATIGUE 10/21/2008  . TRANSAMINASES, SERUM, ELEVATED 07/08/2009   Past Surgical History  Procedure Date  . Abdominal hysterectomy 1979  . Tonsillectomy 1959  . Mamoplasty     reduction  . Basal cell carcinoma excision     reports that she has never smoked. She does not have any smokeless tobacco history on file. Her alcohol and drug histories not on file. family history includes Alcohol abuse in her father; Arthritis in her father and mother; Cancer in her mother; Diabetes in her mother; Hypertension in her maternal grandfather; and 26 / Stillbirths in her paternal grandfather. Allergies  Allergen Reactions  . Codeine Sulfate     REACTION: "hyper"  . Erythromycin Base   . Irbesartan-Hydrochlorothiazide   . Latex       Review of Systems  Constitutional: Negative for fever, activity change, appetite change, fatigue and unexpected weight change.  HENT: Negative for hearing loss, ear pain, sore throat and trouble swallowing.   Eyes: Negative for  visual disturbance.  Respiratory: Negative for cough and shortness of breath.   Cardiovascular: Negative for chest pain and palpitations.  Gastrointestinal: Negative for abdominal pain, diarrhea, constipation and blood in stool.  Genitourinary: Negative for dysuria and hematuria.  Musculoskeletal: Negative for myalgias, back pain and arthralgias.  Skin: Negative for rash.  Neurological: Negative for dizziness, syncope and headaches.  Hematological: Negative for adenopathy.  Psychiatric/Behavioral: Negative for confusion and dysphoric mood.       Objective:   Physical Exam  Constitutional: She is oriented to person, place, and time. She appears well-developed and well-nourished.  HENT:  Head: Normocephalic and atraumatic.  Eyes: EOM are normal. Pupils are equal, round, and reactive to light.  Neck: Normal range of motion. Neck supple. No thyromegaly present.  Cardiovascular: Normal rate, regular rhythm and normal heart sounds.   No murmur heard. Pulmonary/Chest: Breath sounds normal. No respiratory distress. She has no wheezes. She has no rales.  Abdominal: Soft. Bowel sounds are normal. She exhibits no distension and no mass. There is no tenderness. There is no rebound and no guarding.  Musculoskeletal: Normal range of motion. She exhibits no edema.       Feet reveal no skin lesions. Good distal foot pulses. Good capillary refill. No calluses. Normal sensation with monofilament testing   Lymphadenopathy:    She has no cervical adenopathy.  Neurological: She is alert and oriented to person, place, and time. She displays normal reflexes. No cranial nerve deficit.  Skin: No rash noted.  Psychiatric: She has a normal mood and affect. Her behavior is normal. Judgment and thought content normal.  Assessment & Plan:  #1 health maintenance. Tetanus booster given. Influenza given. Check on prior Pneumovax vaccination. Check on insurance coverage for shingles vaccine. She will  establish with new GYN #2 type 2 diabetes. Poor control history. Recommended exercise and weight loss. Reassess A1c 3 months. Titrate Lantus with instructions given. Consider meal time insulin at followup if no better

## 2011-12-15 NOTE — Patient Instructions (Signed)
Titrate Lantus up 2 units every 3 days until fasting less than 130 Consider pedometer with goal of 10,000 steps per day.

## 2011-12-16 ENCOUNTER — Encounter: Payer: BC Managed Care – PPO | Admitting: Family Medicine

## 2011-12-20 ENCOUNTER — Ambulatory Visit (INDEPENDENT_AMBULATORY_CARE_PROVIDER_SITE_OTHER): Payer: BC Managed Care – PPO | Admitting: Family Medicine

## 2011-12-20 ENCOUNTER — Encounter: Payer: Self-pay | Admitting: Family Medicine

## 2011-12-20 VITALS — BP 112/70 | HR 105 | Temp 98.4°F | Wt 184.0 lb

## 2011-12-20 DIAGNOSIS — J209 Acute bronchitis, unspecified: Secondary | ICD-10-CM

## 2011-12-20 DIAGNOSIS — R05 Cough: Secondary | ICD-10-CM

## 2011-12-20 DIAGNOSIS — R059 Cough, unspecified: Secondary | ICD-10-CM

## 2011-12-20 MED ORDER — LEVOFLOXACIN 500 MG PO TABS: 500.0000 mg | ORAL_TABLET | Freq: Every day | ORAL | Status: AC

## 2011-12-20 NOTE — Patient Instructions (Signed)
Bronchitis Bronchitis is the body's way of reacting to injury and/or infection (inflammation) of the bronchi. Bronchi are the air tubes that extend from the windpipe into the lungs. If the inflammation becomes severe, it may cause shortness of breath. CAUSES  Inflammation may be caused by:  A virus.   Germs (bacteria).   Dust.   Allergens.   Pollutants and many other irritants.  The cells lining the bronchial tree are covered with tiny hairs (cilia). These constantly beat upward, away from the lungs, toward the mouth. This keeps the lungs free of pollutants. When these cells become too irritated and are unable to do their job, mucus begins to develop. This causes the characteristic cough of bronchitis. The cough clears the lungs when the cilia are unable to do their job. Without either of these protective mechanisms, the mucus would settle in the lungs. Then you would develop pneumonia. Smoking is a common cause of bronchitis and can contribute to pneumonia. Stopping this habit is the single most important thing you can do to help yourself. TREATMENT   Your caregiver may prescribe an antibiotic if the cough is caused by bacteria. Also, medicines that open up your airways make it easier to breathe. Your caregiver may also recommend or prescribe an expectorant. It will loosen the mucus to be coughed up. Only take over-the-counter or prescription medicines for pain, discomfort, or fever as directed by your caregiver.   Removing whatever causes the problem (smoking, for example) is critical to preventing the problem from getting worse.   Cough suppressants may be prescribed for relief of cough symptoms.   Inhaled medicines may be prescribed to help with symptoms now and to help prevent problems from returning.   For those with recurrent (chronic) bronchitis, there may be a need for steroid medicines.  SEEK IMMEDIATE MEDICAL CARE IF:   During treatment, you develop more pus-like mucus  (purulent sputum).   You have a fever.   Your baby is older than 3 months with a rectal temperature of 102 F (38.9 C) or higher.   Your baby is 87 months old or younger with a rectal temperature of 100.4 F (38 C) or higher.   You become progressively more ill.   You have increased difficulty breathing, wheezing, or shortness of breath.  It is necessary to seek immediate medical care if you are elderly or sick from any other disease. MAKE SURE YOU:   Understand these instructions.   Will watch your condition.   Will get help right away if you are not doing well or get worse.  Document Released: 11/21/2005 Document Revised: 08/03/2011 Document Reviewed: 09/30/2008 [facility] Patient Information 2012 [place].

## 2011-12-20 NOTE — Progress Notes (Signed)
  Subjective:    Patient ID: Dana Dyer, female    DOB: [birth year], 61 y.o.   MRN: [id number]  HPI  Acute visit. Patient had illness back late December which seemed to be clearing. She at that time had headache, cough, and night sweats. Symptoms seem to be improving but last week and she had relapse with production of colored sputum and recurrent diaphoresis and fatigue. Increased cough. Some sinus pressure as well. Has taken Advil with minimal relief. Denies any nausea or vomiting. No sore throat   Review of Systems As per history of present illness    Objective:   Physical Exam  Constitutional: She appears well-developed and well-nourished.  HENT:  Right Ear: External ear normal.  Left Ear: External ear normal.  Mouth/Throat: Oropharynx is clear and moist.  Neck: Neck supple.  Cardiovascular: Normal rate and regular rhythm.   Pulmonary/Chest: Effort normal and breath sounds normal. No respiratory distress. She has no wheezes. She has no rales.  Lymphadenopathy:    She has no cervical adenopathy.          Assessment & Plan:  Acute bronchitis/sinusitis. Start Levaquin 500 mg daily for 7 days given recent relapse of symptoms.

## 2012-01-16 ENCOUNTER — Other Ambulatory Visit: Payer: Self-pay | Admitting: Family Medicine

## 2012-01-16 DIAGNOSIS — Z1231 Encounter for screening mammogram for malignant neoplasm of breast: Secondary | ICD-10-CM

## 2012-02-01 ENCOUNTER — Ambulatory Visit
Admission: RE | Admit: 2012-02-01 | Discharge: 2012-02-01 | Disposition: A | Discharge status: Home / Self Care | Payer: BC Managed Care – PPO | Source: Ambulatory Visit | Attending: Family Medicine | Admitting: Family Medicine

## 2012-02-01 ENCOUNTER — Other Ambulatory Visit: Payer: Self-pay | Admitting: Family Medicine

## 2012-02-01 DIAGNOSIS — Z1231 Encounter for screening mammogram for malignant neoplasm of breast: Secondary | ICD-10-CM

## 2012-02-29 ENCOUNTER — Ambulatory Visit: Payer: BC Managed Care – PPO | Admitting: Family Medicine

## 2012-03-06 ENCOUNTER — Telehealth: Payer: Self-pay | Admitting: Pulmonary Disease

## 2012-03-06 NOTE — Telephone Encounter (Signed)
I spoke with pt and she stated she thinks she may have a possible sinus infection. Pt last seen 07/09/10 and advised her she will need OV to be evaluated. Pt is scheduled to come in and see TP 03/07/12 at 9:30. Pt aware to seek emergency care if she worsens before then

## 2012-03-07 ENCOUNTER — Ambulatory Visit (INDEPENDENT_AMBULATORY_CARE_PROVIDER_SITE_OTHER): Payer: BC Managed Care – PPO | Admitting: Adult Health

## 2012-03-07 ENCOUNTER — Encounter: Payer: Self-pay | Admitting: Adult Health

## 2012-03-07 VITALS — BP 132/88 | HR 92 | Temp 96.9°F | Ht 63.0 in | Wt 186.6 lb

## 2012-03-07 DIAGNOSIS — J309 Allergic rhinitis, unspecified: Secondary | ICD-10-CM

## 2012-03-07 DIAGNOSIS — G4733 Obstructive sleep apnea (adult) (pediatric): Secondary | ICD-10-CM

## 2012-03-07 MED ORDER — MOMETASONE FUROATE 50 MCG/ACT NA SUSP: 2.0000 | Freq: Two times a day (BID) | NASAL | Status: DC

## 2012-03-07 MED ORDER — AMOXICILLIN-POT CLAVULANATE 875-125 MG PO TABS: 1.0000 | ORAL_TABLET | Freq: Two times a day (BID) | ORAL | Status: AC

## 2012-03-07 MED ORDER — LEVALBUTEROL HCL 0.63 MG/3ML IN NEBU
0.6300 mg | INHALATION_SOLUTION | Freq: Once | RESPIRATORY_TRACT | Status: AC
  Administered 2012-03-07: 0.63 mg via RESPIRATORY_TRACT

## 2012-03-07 MED ORDER — HYDROCODONE-HOMATROPINE 5-1.5 MG/5ML PO SYRP: 5.0000 mL | ORAL_SOLUTION | Freq: Four times a day (QID) | ORAL | Status: AC | PRN

## 2012-03-07 NOTE — Patient Instructions (Signed)
Augmentin 864m Twice daily  For 10 days  Mucinex DM Twice daily  As needed  Cough/congestion  Hydromet 1-2 teaspoons every 4-6 hours as needed for cough, may call, sleepiness Saline nasal rinses as needed. Saline nasal gel at bedtime as needed. Begin Nasonex 2 puffs twice daily Hold Claritin for 1 week and then restart daily Followup with Dr. AElsworth Sohoin 6 weeks and as needed Please contact office for sooner follow up if symptoms do not improve or worsen or seek emergency care

## 2012-03-07 NOTE — Progress Notes (Signed)
  Subjective:    Patient ID: Dana Dyer, female    DOB: [birth year], 61 y.o.   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  PSG 5/06 showed mild OSA with AHI 12/hr, severe in REM with AHI 32/h --> corrected with CPAP +10 cm , pillows with chin strap ( confirmed by download on auto )   11/09 >> choking episodes, has to keep her hand on pillows to keep it in place, chin strap did not work.   July 09, 2010  Lost 20 lbs since study, she wears her cpap machine every night. Approx 6 hours per night. Pt states she has noticed "apnea episodes" dispite wearing cpap machine. Pt c/o dry mouth and dry eyes d/t the cpap machine ..   03/07/2012 Acute OV  Complains of recurrent sinus symptoms. Treated  with Levaquin in 12/2011 for sinus infection. Complains of headache , facial pressure, sore throat, prod. cough, PND, F/C.  For last 1 week. OTC not helping. No longer using CPAP-off for last 2 years.    Review of Systems Constitutional:   No  weight loss, night sweats,  + Fevers, chills, fatigue, or  lassitude.  HEENT:   No headaches,  Difficulty swallowing,  Tooth/dental problems, or  Sore throat,                No sneezing, itching, ear ache,  +nasal congestion, post nasal drip,   CV:  No chest pain,  Orthopnea, PND, swelling in lower extremities, anasarca, dizziness, palpitations, syncope.   GI  No heartburn, indigestion, abdominal pain, nausea, vomiting, diarrhea, change in bowel habits, loss of appetite, bloody stools.   Resp:  No coughing up of blood.    No chest wall deformity  Skin: no rash or lesions.  GU: no dysuria, change in color of urine, no urgency or frequency.  No flank pain, no hematuria   MS:  No joint pain or swelling.  No decreased range of motion.  No back pain.  Psych:  No change in mood or affect. No depression or anxiety.  No memory loss.         Objective:   Physical Exam GEN: A/Ox3; pleasant , NAD  HEENT:   [place]/AT,  EACs-clear, TMs-wnl, NOSE-clear drainage - max sinus tenderness , THROAT-clear, no lesions, no postnasal drip or exudate noted.   NECK:  Supple w/ fair ROM; no JVD; normal carotid impulses w/o bruits; no thyromegaly or nodules palpated; no lymphadenopathy.  RESP  Coarse BS  w/o, wheezes/ rales/ or rhonchi.no accessory muscle use, no dullness to percussion  CARD:  RRR, no m/r/g  , no peripheral edema, pulses intact, no cyanosis or clubbing.  GI:   Soft & nt; nml bowel sounds; no organomegaly or masses detected.  Musco: Warm bil, no deformities or joint swelling noted.   Neuro: alert, no focal deficits noted.    Skin: Warm, no lesions or rashes         Assessment & Plan:

## 2012-03-07 NOTE — Assessment & Plan Note (Signed)
follow up Dr. Elsworth Soho  In 6 weeks and As needed

## 2012-03-07 NOTE — Progress Notes (Signed)
Addended by: Parke Poisson E on: 03/07/2012 12:43 PM   Modules accepted: Orders

## 2012-03-07 NOTE — Assessment & Plan Note (Signed)
Flare with sinusitis   Plan:  Augmentin 869m Twice daily  For 10 days  Mucinex DM Twice daily  As needed  Cough/congestion  Hydromet 1-2 teaspoons every 4-6 hours as needed for cough, may call, sleepiness Saline nasal rinses as needed. Saline nasal gel at bedtime as needed. Begin Nasonex 2 puffs twice daily Hold Claritin for 1 week and then restart daily Followup with Dr. AElsworth Sohoin 6 weeks and as needed Please contact office for sooner follow up if symptoms do not improve or worsen or seek emergency care

## 2012-03-09 ENCOUNTER — Telehealth: Payer: Self-pay | Admitting: Pulmonary Disease

## 2012-03-09 MED ORDER — LEVOFLOXACIN 500 MG PO TABS: 500.0000 mg | ORAL_TABLET | Freq: Every day | ORAL | Status: DC

## 2012-03-09 NOTE — Telephone Encounter (Signed)
I spoke with pt and she states the Augmentin has caused her to have severe diarrhea. Her bowels are very loose and she can't stay out of the bathroom. States she now has a bleeding Hemorid from this but it's not severe. She states everything she eats/drinks goes straight through her. She is requesting this be changed to something else. She has not taken the Augmentin today. Please advise TP, thanks  Allergies  Allergen Reactions  . Codeine Sulfate     REACTION: "hyper"  . Erythromycin Base   . Irbesartan-Hydrochlorothiazide   . Latex

## 2012-03-09 NOTE — Telephone Encounter (Signed)
Stop Augmentin .  May send Levaquin 567m daily for 10 days #10 , no refills  Begin Align daily -probiotics  Advance bland diet as tolerated  May take 1 dose of Immodium AD x 1 only  If diarrhea is not improving will need sooner follow up or ER  Please contact office for sooner follow up if symptoms do not improve or worsen or seek emergency care  Push fluids

## 2012-03-09 NOTE — Telephone Encounter (Signed)
I spoke with pt and is aware of TP recs. New rx has been sent to the pharmacy. She had no further questions

## 2012-03-16 ENCOUNTER — Encounter: Payer: Self-pay | Admitting: Family Medicine

## 2012-03-16 ENCOUNTER — Ambulatory Visit (INDEPENDENT_AMBULATORY_CARE_PROVIDER_SITE_OTHER): Payer: BC Managed Care – PPO | Admitting: Family Medicine

## 2012-03-16 VITALS — BP 150/90 | Temp 97.8°F | Wt 188.0 lb

## 2012-03-16 DIAGNOSIS — IMO0001 Reserved for inherently not codable concepts without codable children: Secondary | ICD-10-CM

## 2012-03-16 LAB — HEMOGLOBIN A1C: Hgb A1c MFr Bld: 9.3 % — ABNORMAL HIGH (ref 4.6–6.5)

## 2012-03-16 MED ORDER — FLUCONAZOLE 150 MG PO TABS: 150.0000 mg | ORAL_TABLET | Freq: Once | ORAL | Status: DC

## 2012-03-16 NOTE — Progress Notes (Signed)
  Subjective:    Patient ID: Dana Dyer, female    DOB: 11/04/1951, 61 y.o.   MRN: [id number]  HPI  Followup type 2 diabetes. History of poor control. Last A1c 10%. She has increased the Lantus since then currently 56 units daily. Fasting blood sugars consistently around 130. She started walking more. She is not checking postprandials. We have previously discussed adding short acting insulin at meals. Denies any symptoms of hyperglycemia.  Currently on antibiotic per pulmonologist for sinusitis and has some vaginitis symptoms. Taking Monistat without much improvement.   Review of Systems  Constitutional: Negative for fever, chills and unexpected weight change.  HENT: Positive for congestion.   Respiratory: Positive for cough. Negative for shortness of breath and wheezing.   Cardiovascular: Negative for chest pain.  Genitourinary: Negative for dysuria.  Neurological: Negative for headaches.       Objective:   Physical Exam  Constitutional: She is oriented to person, place, and time. She appears well-developed and well-nourished.  HENT:  Mouth/Throat: Oropharynx is clear and moist.  Neck: Neck supple. No thyromegaly present.  Cardiovascular: Normal rate and regular rhythm.   Pulmonary/Chest: Effort normal and breath sounds normal. No respiratory distress. She has no wheezes. She has no rales.  Musculoskeletal:       Feet reveal no skin lesions. Good distal foot pulses. Good capillary refill. No calluses. Normal sensation with monofilament testing   Lymphadenopathy:    She has no cervical adenopathy.  Neurological: She is alert and oriented to person, place, and time.          Assessment & Plan:  #1 type 2 diabetes. History of poor control. Repeat A1c. Add mealtime insulin if still poorly controlled. Continue yearly eye exams. #2 yeast vaginitis related to recent antibiotic use. Diflucan 150 mg one dose

## 2012-03-20 ENCOUNTER — Other Ambulatory Visit: Payer: Self-pay | Admitting: Family Medicine

## 2012-03-20 MED ORDER — INSULIN LISPRO 100 UNIT/ML ~~LOC~~ SOLN: SUBCUTANEOUS | Status: DC

## 2012-03-20 NOTE — Progress Notes (Signed)
Quick Note:  Pt informed on work personally identified VM, copy mailed to her home, Rx ordered ______

## 2012-03-21 ENCOUNTER — Ambulatory Visit: Payer: BC Managed Care – PPO | Admitting: Pulmonary Disease

## 2012-04-08 ENCOUNTER — Other Ambulatory Visit: Payer: Self-pay | Admitting: Family Medicine

## 2012-04-12 ENCOUNTER — Other Ambulatory Visit: Payer: Self-pay | Admitting: Family Medicine

## 2012-04-13 ENCOUNTER — Encounter: Payer: Self-pay | Admitting: Family Medicine

## 2012-05-15 ENCOUNTER — Telehealth: Payer: Self-pay | Admitting: Family Medicine

## 2012-05-15 NOTE — Telephone Encounter (Signed)
Fluconazole 150 mg po times one dose 

## 2012-05-15 NOTE — Telephone Encounter (Signed)
Caller: [place]ette/Patient; PCP: Carolann Littler; CB#: [phone]; ; ; Call regarding Vaginal Irritation/Yeast Infx; onset 05/10/12.  States she has tried probiotics but it is not clearing on its own.  States this past fall, she was prescribed diflucan and it worked very well for her.  Requesting Rx to be called in.  Per protocol, emergent symptoms denied; INFO TO OFFICE FOR PROVIDER REVIEW/RX/CALLBACK. USES [facility].   MAY REACH [facility] AT [phone].

## 2012-05-21 ENCOUNTER — Other Ambulatory Visit: Payer: Self-pay | Admitting: Family Medicine

## 2012-05-23 ENCOUNTER — Other Ambulatory Visit: Payer: Self-pay | Admitting: Family Medicine

## 2012-05-24 NOTE — Telephone Encounter (Signed)
This med not on pt active med list, last filled April 2013

## 2012-05-24 NOTE — Telephone Encounter (Signed)
Refill OK

## 2012-06-20 ENCOUNTER — Ambulatory Visit (INDEPENDENT_AMBULATORY_CARE_PROVIDER_SITE_OTHER): Payer: BC Managed Care – PPO | Admitting: Family Medicine

## 2012-06-20 ENCOUNTER — Encounter: Payer: Self-pay | Admitting: Family Medicine

## 2012-06-20 VITALS — BP 140/80 | Temp 98.3°F | Wt 185.0 lb

## 2012-06-20 DIAGNOSIS — IMO0001 Reserved for inherently not codable concepts without codable children: Secondary | ICD-10-CM

## 2012-06-20 LAB — HEMOGLOBIN A1C: Hgb A1c MFr Bld: 8.1 % — ABNORMAL HIGH (ref 4.6–6.5)

## 2012-06-20 NOTE — Progress Notes (Signed)
  Subjective:    Patient ID: Dana Dyer, female    DOB: January 14, 1951, 61 y.o.   MRN: [id number]  HPI  Patient here for followup diabetes. Currently takes Lantus 54-56 units daily and we recently added short acting mealtime insulin 5 units which she titrates between 5 and 10 years depending on her carbohydrate intake. She has seen some improvement in postprandial blood sugars. Overall feels well. Only rare hypoglycemic symptoms. Most recent A1c 9 range. She's had poor control for some time. Walks for exercise somewhat inconsistently. No chest pains or dyspnea.  Past Medical History  Diagnosis Date  . DIABETES MELLITUS, UNCONTROLLED 03/16/2009  . HYPERLIPIDEMIA 10/09/2007  . OBSTRUCTIVE SLEEP APNEA 10/09/2007  . HYPERTENSION 10/09/2007  . ALLERGIC RHINITIS 10/09/2007  . FIBROMYALGIA 10/09/2007  . FATIGUE 10/21/2008  . TRANSAMINASES, SERUM, ELEVATED 07/08/2009   Past Surgical History  Procedure Date  . Abdominal hysterectomy 1979  . Tonsillectomy 1959  . Mamoplasty     reduction  . Basal cell carcinoma excision     reports that she has never smoked. She does not have any smokeless tobacco history on file. Her alcohol and drug histories not on file. family history includes Alcohol abuse in her father; Arthritis in her father and mother; Cancer in her mother; Diabetes in her mother; Hypertension in her maternal grandfather; and 57 / Stillbirths in her paternal grandfather. Allergies  Allergen Reactions  . Amoxicillin-Pot Clavulanate     Severe diarrhea  . Codeine Sulfate     REACTION: "hyper"  . Erythromycin Base   . Irbesartan-Hydrochlorothiazide   . Latex       Review of Systems  Constitutional: Negative for fatigue.  Eyes: Negative for visual disturbance.  Respiratory: Negative for cough, chest tightness, shortness of breath and wheezing.   Cardiovascular: Negative for chest pain, palpitations and leg swelling.  Neurological: Negative for dizziness, seizures, syncope,  weakness, light-headedness and headaches.       Objective:   Physical Exam  Constitutional: She appears well-developed and well-nourished. No distress.  Cardiovascular: Normal rate and regular rhythm.   Pulmonary/Chest: Effort normal and breath sounds normal. No respiratory distress. She has no wheezes. She has no rales.  Musculoskeletal:       Feet reveal no skin lesions. Good distal foot pulses. Good capillary refill. No calluses. Normal sensation with monofilament testing           Assessment & Plan:  Type 2 diabetes. History of poor control. Recheck A1c. Establish more consistent exercise. Continue weight loss efforts. She has lost 3 pounds since last visit.

## 2012-06-21 NOTE — Progress Notes (Signed)
Quick Note:  Pt informed ______ 

## 2012-07-21 ENCOUNTER — Other Ambulatory Visit: Payer: Self-pay | Admitting: Family Medicine

## 2012-07-25 ENCOUNTER — Other Ambulatory Visit: Payer: Self-pay | Admitting: Family Medicine

## 2012-08-21 ENCOUNTER — Other Ambulatory Visit: Payer: Self-pay | Admitting: Family Medicine

## 2012-08-28 ENCOUNTER — Other Ambulatory Visit: Payer: Self-pay | Admitting: Dermatology

## 2012-09-01 ENCOUNTER — Other Ambulatory Visit: Payer: Self-pay | Admitting: Family Medicine

## 2012-09-16 ENCOUNTER — Other Ambulatory Visit: Payer: Self-pay | Admitting: Family Medicine

## 2012-09-20 ENCOUNTER — Ambulatory Visit: Payer: BC Managed Care – PPO | Admitting: Family Medicine

## 2012-09-24 ENCOUNTER — Telehealth: Payer: Self-pay | Admitting: Family Medicine

## 2012-09-24 MED ORDER — INSULIN PEN NEEDLE 29G X 12.7MM MISC: Status: DC

## 2012-09-24 NOTE — Telephone Encounter (Signed)
Dr. Elease Hashimoto increased pt's insulin to 3 a day. However, needle rx was not changed. She is now out of needles and at the pharmacy. They state they've faxed [place] a request for a rx that states she needs 3 needles a day. 90 needles for 30 days. They are requesting we call them w/ verbal order for this, so pt can get her needles. She needs them today.  Pharmacy is [facility] on [address] - telephone [phone] Pt is there NOW. Thanks.

## 2012-09-24 NOTE — Telephone Encounter (Signed)
I called in the RX to the pharmacy while she was there, injecting 3 times a day

## 2012-09-25 ENCOUNTER — Encounter: Payer: Self-pay | Admitting: Family Medicine

## 2012-09-25 ENCOUNTER — Ambulatory Visit (INDEPENDENT_AMBULATORY_CARE_PROVIDER_SITE_OTHER): Payer: BC Managed Care – PPO | Admitting: Family Medicine

## 2012-09-25 VITALS — BP 148/82 | Temp 98.0°F | Wt 184.0 lb

## 2012-09-25 DIAGNOSIS — Z23 Encounter for immunization: Secondary | ICD-10-CM

## 2012-09-25 DIAGNOSIS — E559 Vitamin D deficiency, unspecified: Secondary | ICD-10-CM

## 2012-09-25 DIAGNOSIS — IMO0001 Reserved for inherently not codable concepts without codable children: Secondary | ICD-10-CM

## 2012-09-25 DIAGNOSIS — I10 Essential (primary) hypertension: Secondary | ICD-10-CM

## 2012-09-25 MED ORDER — INSULIN PEN NEEDLE 29G X 12.7MM MISC: Status: DC

## 2012-09-25 MED ORDER — ONETOUCH ULTRASOFT LANCETS MISC: Status: DC

## 2012-09-25 NOTE — Patient Instructions (Signed)
Consider complete physical at follow up in January or February.

## 2012-09-25 NOTE — Progress Notes (Signed)
  Subjective:    Patient ID: Dana Dyer, female    DOB: March 19, 1951, 61 y.o.   MRN: [id number]  HPI  Patient here for medical followup. Type 2 diabetes. Improving control with most recent A1c just above 8. She's taking Humalog though inconsistently. She remains on Lantus 54 units daily and metformin. No recent hypoglycemic symptoms. She's trying to walk more for exercise. Not monitoring blood pressure at home. Compliant with losartan HCTZ. No orthostasis. No headaches. No chest pains. Still needs flu vaccine.  Patient requesting vitamin D level. She has started some diet vitamin D and calcium supplementation.  Past Medical History  Diagnosis Date  . DIABETES MELLITUS, UNCONTROLLED 03/16/2009  . HYPERLIPIDEMIA 10/09/2007  . OBSTRUCTIVE SLEEP APNEA 10/09/2007  . HYPERTENSION 10/09/2007  . ALLERGIC RHINITIS 10/09/2007  . FIBROMYALGIA 10/09/2007  . FATIGUE 10/21/2008  . TRANSAMINASES, SERUM, ELEVATED 07/08/2009   Past Surgical History  Procedure Date  . Abdominal hysterectomy 1979  . Tonsillectomy 1959  . Mamoplasty     reduction  . Basal cell carcinoma excision     reports that she has never smoked. She does not have any smokeless tobacco history on file. Her alcohol and drug histories not on file. family history includes Alcohol abuse in her father; Arthritis in her father and mother; Cancer in her mother; Diabetes in her mother; Hypertension in her maternal grandfather; and 84 / Stillbirths in her paternal grandfather. Allergies  Allergen Reactions  . Amoxicillin-Pot Clavulanate     Severe diarrhea  . Codeine Sulfate     REACTION: "hyper"  . Erythromycin Base   . Irbesartan-Hydrochlorothiazide   . Latex       Review of Systems  Constitutional: Negative for fatigue.  Eyes: Negative for visual disturbance.  Respiratory: Negative for cough, chest tightness, shortness of breath and wheezing.   Cardiovascular: Negative for chest pain, palpitations and leg swelling.    Neurological: Negative for dizziness, seizures, syncope, weakness, light-headedness and headaches.       Objective:   Physical Exam  Constitutional: She is oriented to person, place, and time. She appears well-developed and well-nourished.  Neck: Neck supple. No thyromegaly present.  Cardiovascular: Normal rate and regular rhythm.  Exam reveals no gallop.   Pulmonary/Chest: Effort normal and breath sounds normal. No respiratory distress. She has no wheezes. She has no rales.  Musculoskeletal: She exhibits no edema.  Neurological: She is alert and oriented to person, place, and time.          Assessment & Plan:  #1 hypertension. Slightly elevated today. Discussed options including monitoring with weight loss and reassess in 3 months and she prefers this versus additional medication. At followup consider addition of amlodipine if still up. #2 type 2 diabetes. History of poor control. Recheck A1c #3 health maintenance. Flu vaccine given

## 2012-09-26 ENCOUNTER — Telehealth: Payer: Self-pay | Admitting: Family Medicine

## 2012-09-26 NOTE — Progress Notes (Signed)
Quick Note:  Pt informed ______ 

## 2012-09-26 NOTE — Telephone Encounter (Signed)
Pt calling - requesting call back w/ A1C results from 09/25/2012. Thanks!

## 2012-09-26 NOTE — Telephone Encounter (Signed)
I called pt, results given

## 2012-09-27 ENCOUNTER — Telehealth: Payer: Self-pay | Admitting: Family Medicine

## 2012-09-27 MED ORDER — AMLODIPINE BESYLATE 5 MG PO TABS: 5.0000 mg | ORAL_TABLET | Freq: Every day | ORAL | Status: DC

## 2012-09-27 NOTE — Telephone Encounter (Signed)
Pt called and said that bp is running on average of 150/90's while on bp med.

## 2012-09-27 NOTE — Telephone Encounter (Signed)
Start Amlodipine 5 mg daily and office f/u to reassess BP one month

## 2012-09-27 NOTE — Telephone Encounter (Signed)
Pt informed, one month OV being scheduled

## 2012-10-19 ENCOUNTER — Other Ambulatory Visit: Payer: Self-pay | Admitting: Family Medicine

## 2012-10-29 ENCOUNTER — Encounter: Payer: Self-pay | Admitting: Family Medicine

## 2012-10-29 ENCOUNTER — Ambulatory Visit (INDEPENDENT_AMBULATORY_CARE_PROVIDER_SITE_OTHER): Payer: BC Managed Care – PPO | Admitting: Family Medicine

## 2012-10-29 VITALS — BP 126/80 | Temp 98.4°F | Wt 186.0 lb

## 2012-10-29 DIAGNOSIS — I10 Essential (primary) hypertension: Secondary | ICD-10-CM

## 2012-10-29 NOTE — Progress Notes (Signed)
  Subjective:    Patient ID: Dana Dyer, female    DOB: 18-Jun-1951, 61 y.o.   MRN: [id number]  HPI  Followup hypertension. Patient called back after last visit with elevated blood pressure of 331 systolic. We started amlodipine 5 mg daily. No side effects. Still getting some blood pressure readings 140-150 range at home. She also remains on losartan HCTZ. No headaches. No peripheral edema. Blood sugars have improved since last visit according to home readings. She's taking her Humalog insulin more consistently. No hypoglycemia. She walks some for exercise   Review of Systems  Constitutional: Negative for fatigue.  Eyes: Negative for visual disturbance.  Respiratory: Negative for cough, chest tightness, shortness of breath and wheezing.   Cardiovascular: Negative for chest pain, palpitations and leg swelling.  Neurological: Negative for dizziness, seizures, syncope, weakness, light-headedness and headaches.       Objective:   Physical Exam  Constitutional: She appears well-developed and well-nourished.  Cardiovascular: Normal rate and regular rhythm.   Pulmonary/Chest: Effort normal and breath sounds normal. No respiratory distress. She has no wheezes. She has no rales.  Musculoskeletal: She exhibits no edema.          Assessment & Plan:  Hypertension. Improved. Continue current medications. Continue weight loss efforts. Reassess at physical in February

## 2012-11-16 LAB — HM DIABETES EYE EXAM

## 2012-12-25 ENCOUNTER — Other Ambulatory Visit: Payer: Self-pay | Admitting: Family Medicine

## 2012-12-25 DIAGNOSIS — Z1231 Encounter for screening mammogram for malignant neoplasm of breast: Secondary | ICD-10-CM

## 2013-01-14 ENCOUNTER — Encounter: Payer: Self-pay | Admitting: Family Medicine

## 2013-01-14 ENCOUNTER — Other Ambulatory Visit (INDEPENDENT_AMBULATORY_CARE_PROVIDER_SITE_OTHER): Payer: BC Managed Care – PPO

## 2013-01-14 ENCOUNTER — Other Ambulatory Visit: Payer: Self-pay | Admitting: Family Medicine

## 2013-01-14 DIAGNOSIS — Z0000 Encounter for general adult medical examination without abnormal findings: Secondary | ICD-10-CM

## 2013-01-14 LAB — BASIC METABOLIC PANEL
BUN: 12 mg/dL (ref 6–23)
Creatinine, Ser: 0.6 mg/dL (ref 0.4–1.2)
GFR: 109.28 mL/min (ref >60.00)

## 2013-01-14 LAB — HEPATIC FUNCTION PANEL
ALT: 48 U/L — ABNORMAL HIGH (ref 0–35)
AST: 36 U/L (ref 0–37)
Alkaline Phosphatase: 69 U/L (ref 39–117)
Bilirubin, Direct: 0.2 mg/dL (ref 0.0–0.3)
Total Bilirubin: 1.2 mg/dL (ref 0.3–1.2)

## 2013-01-14 LAB — POCT URINALYSIS DIPSTICK
Bilirubin, UA: NEGATIVE
Blood, UA: NEGATIVE
Glucose, UA: NEGATIVE
Spec Grav, UA: 1.015
Urobilinogen, UA: 2

## 2013-01-14 LAB — CBC WITH DIFFERENTIAL/PLATELET
Eosinophils Relative: 2.4 % (ref 0.0–5.0)
MCV: 86.2 fl (ref 78.0–100.0)
Monocytes Absolute: 0.6 10*3/uL (ref 0.1–1.0)
Monocytes Relative: 9.9 % (ref 3.0–12.0)
Neutrophils Relative %: 49.8 % (ref 43.0–77.0)
Platelets: 227 10*3/uL (ref 150.0–400.0)
WBC: 6.5 10*3/uL (ref 4.5–10.5)

## 2013-01-14 LAB — LIPID PANEL
Cholesterol: 152 mg/dL (ref 0–200)
LDL Cholesterol: 84 mg/dL (ref 0–99)
Triglycerides: 98 mg/dL (ref 0.0–149.0)

## 2013-01-15 ENCOUNTER — Ambulatory Visit (INDEPENDENT_AMBULATORY_CARE_PROVIDER_SITE_OTHER): Payer: BC Managed Care – PPO | Admitting: Family Medicine

## 2013-01-15 ENCOUNTER — Encounter: Payer: Self-pay | Admitting: Family Medicine

## 2013-01-15 VITALS — BP 140/82 | HR 97 | Temp 98.0°F | Wt 183.0 lb

## 2013-01-15 DIAGNOSIS — H60399 Other infective otitis externa, unspecified ear: Secondary | ICD-10-CM

## 2013-01-15 DIAGNOSIS — J069 Acute upper respiratory infection, unspecified: Secondary | ICD-10-CM

## 2013-01-15 MED ORDER — CIPROFLOXACIN-HYDROCORTISONE 0.2-1 % OT SUSP: 3.0000 [drp] | Freq: Two times a day (BID) | OTIC | Status: DC

## 2013-01-15 NOTE — Patient Instructions (Addendum)
INSTRUCTIONS FOR UPPER RESPIRATORY INFECTION:  -As we discussed, we have prescribed a new medication  (CIPRO HC) for you at this appointment. We discussed the common and serious potential adverse effects of this medication and you can review these and more with the pharmacist when you pick up your medication.  Please follow the instructions for use carefully and notify us immediately if you have any problems taking this medication.  -if ear pain is not getting better over the next few days or gets worse follow up with your doctor  -plenty of rest and fluids  -nasal saline wash 2-3 times daily (use prepackaged nasal saline or bottled/distilled water if making your own)   -can use sinex or afrin nasal spray for drainage and nasal congestion - but do NOT use longer then 3-4 days  -can use tylenol or ibuprofen as directed for aches and sorethroat  -in the winter time, using a humidifier at night is helpful (please follow cleaning instructions)  -if you are taking a cough medication - use only as directed, may also try a teaspoon of honey to coat the throat and throat lozenges  -for sore throat, salt water gargles can help  -follow up if you have fevers, facial pain, tooth pain, difficulty breathing or are worsening or not getting better in 5-7 days

## 2013-01-15 NOTE — Progress Notes (Signed)
Chief Complaint  Patient presents with  . Otalgia    upper respiratory and headache     HPI:  Acute visit for R ear pain: -started: 1 week ago -symptoms: has had a cold for about a week with a lot of nasal congestion and cough, now with R ear pain since yesterday and some R sided facial pain right below ear -denies: fevers, chills, NVD -history of: hx of recurrent sinus infections  ROS: See pertinent positives and negatives per HPI.  Past Medical History  Diagnosis Date  . DIABETES MELLITUS, UNCONTROLLED 03/16/2009  . HYPERLIPIDEMIA 10/09/2007  . OBSTRUCTIVE SLEEP APNEA 10/09/2007  . HYPERTENSION 10/09/2007  . ALLERGIC RHINITIS 10/09/2007  . FIBROMYALGIA 10/09/2007  . FATIGUE 10/21/2008  . TRANSAMINASES, SERUM, ELEVATED 07/08/2009    Family History  Problem Relation Age of Onset  . Diabetes Mother   . Arthritis Mother   . Cancer Mother     breast  . Alcohol abuse Father   . Arthritis Father   . Hypertension Maternal Grandfather   . Miscarriages / Stillbirths Paternal Grandfather     History   Social History  . Marital Status: Married    Spouse Name: N/A    Number of Children: N/A  . Years of Education: N/A   Social History Main Topics  . Smoking status: Never Smoker   . Smokeless tobacco: None  . Alcohol Use: None  . Drug Use: None  . Sexually Active: None   Other Topics Concern  . None   Social History Narrative  . None    Current outpatient prescriptions:amLODipine (NORVASC) 5 MG tablet, TAKE ONE TABLET BY MOUTH ONE TIME DAILY, Disp: 90 tablet, Rfl: 3;  CRESTOR 10 MG tablet, TAKE ONE TABLET BY MOUTH ONE TIME DAILY, Disp: 90 tablet, Rfl: 3;  escitalopram (LEXAPRO) 10 MG tablet, TAKE ONE TABLET BY MOUTH ONE TIME DAILY, Disp: 90 tablet, Rfl: 3;  fluorouracil (EFUDEX) 5 % cream, Apply topically daily. Per Dermatology, Wilhemina Bonito, MD, Disp: , Rfl:  insulin glargine (LANTUS SOLOSTAR) 100 UNIT/ML injection, , Disp: , Rfl: ;  insulin lispro (HUMALOG PEN) 100 UNIT/ML  injection, Inject into skin 5 units with lunch and 5 units with supper, Disp: 10 mL, Rfl: 12;  Insulin Pen Needle (BD ULTRA-FINE PEN NEEDLES) 29G X 12.7MM MISC, Pt it injecting insulin three times a day, Disp: 300 each, Rfl: 12;  Lancets (ONETOUCH ULTRASOFT) lancets, Use as instructed twice daily, Disp: 100 each, Rfl: 12 LANTUS SOLOSTAR 100 UNIT/ML injection, INJECT 56 UNITS DAILY AS DIRECTED., Disp: 3 mL, Rfl: 2;  LANTUS SOLOSTAR 100 UNIT/ML injection, INJECT 56 UNITS DAILY AS DIRECTED., Disp: 15 mL, Rfl: 2;  losartan-hydrochlorothiazide (HYZAAR) 100-25 MG per tablet, TAKE ONE TABLET BY MOUTH ONE TIME DAILY, Disp: 30 tablet, Rfl: 11 metFORMIN (GLUCOPHAGE) 1000 MG tablet, Take 1 tablet (1,000 mg total) by mouth 2 (two) times daily with a meal., Disp: 180 tablet, Rfl: 2;  metroNIDAZOLE (METROGEL) 0.75 % gel, Apply 1 application topically. Per Dermatology, Wilhemina Bonito, MD, Disp: , Rfl: ;  mometasone (NASONEX) 50 MCG/ACT nasal spray, Place 2 sprays into the nose 2 (two) times daily., Disp: 17 g, Rfl: 5 ciprofloxacin-hydrocortisone (CIPRO HC) otic suspension, Place 3 drops into the right ear 2 (two) times daily. For 7 days., Disp: 10 mL, Rfl: 0 Current facility-administered medications:TDaP (BOOSTRIX) injection 0.5 mL, 0.5 mL, Intramuscular, Once, Eulas Post, MD  EXAM:  Filed Vitals:   01/15/13 1352  BP: 140/82  Pulse: 97  Temp: 98 F (  36.7 C)    Body mass index is 32.43 kg/(m^2).  GENERAL: vitals reviewed and listed above, alert, oriented, appears well hydrated and in no acute distress  HEENT: atraumatic, conjunttiva clear, no obvious abnormalities on inspection of external nose and ears, normal appearance of ear canals and TMs except for some erythema and swelling mild of R ear canal, pain with manipulation of tragus, small tender perauricular LAD, no mastoid TTP, clear nasal congestion, mild post oropharyngeal erythema with PND, no tonsillar edema or exudate, no sinus TTP  NECK: no obvious  masses on inspection  LUNGS: clear to auscultation bilaterally, no wheezes, rales or rhonchi, good air movement  CV: HRRR, no peripheral edema  MS: moves all extremities without noticeable abnormality  PSYCH: pleasant and cooperative, no obvious depression or anxiety  ASSESSMENT AND PLAN:  Discussed the following assessment and plan:  1. Otitis, externa, infective  ciprofloxacin-hydrocortisone (CIPRO HC) otic suspension   ciprofloxacin-hydrocortisone (CIPRO HC) otic suspension  2. Acute upper respiratory infections of unspecified site     -exam c/w otitis externa - starting topical tx today and advised to return or notify a doctor immediately if symptoms worsen or persist or new concerns arise. Warned of potential complications of this illness.  Patient Instructions  INSTRUCTIONS FOR UPPER RESPIRATORY INFECTION:  -As we discussed, we have prescribed a new medication  (CIPRO HC) for you at this appointment. We discussed the common and serious potential adverse effects of this medication and you can review these and more with the pharmacist when you pick up your medication.  Please follow the instructions for use carefully and notify us immediately if you have any problems taking this medication.  -if ear pain is not getting better over the next few days or gets worse follow up with your doctor  -plenty of rest and fluids  -nasal saline wash 2-3 times daily (use prepackaged nasal saline or bottled/distilled water if making your own)   -can use sinex or afrin nasal spray for drainage and nasal congestion - but do NOT use longer then 3-4 days  -can use tylenol or ibuprofen as directed for aches and sorethroat  -in the winter time, using a humidifier at night is helpful (please follow cleaning instructions)  -if you are taking a cough medication - use only as directed, may also try a teaspoon of honey to coat the throat and throat lozenges  -for sore throat, salt water gargles can  help  -follow up if you have fevers, facial pain, tooth pain, difficulty breathing or are worsening or not getting better in 5-7 days      ,  R.

## 2013-01-17 ENCOUNTER — Encounter: Payer: Self-pay | Admitting: Family Medicine

## 2013-01-21 ENCOUNTER — Encounter: Payer: BC Managed Care – PPO | Admitting: Family Medicine

## 2013-01-24 ENCOUNTER — Ambulatory Visit (INDEPENDENT_AMBULATORY_CARE_PROVIDER_SITE_OTHER): Payer: BC Managed Care – PPO | Admitting: Family Medicine

## 2013-01-24 ENCOUNTER — Encounter: Payer: Self-pay | Admitting: Family Medicine

## 2013-01-24 VITALS — BP 130/70 | HR 72 | Temp 97.8°F | Resp 12 | Ht 62.25 in | Wt 182.0 lb

## 2013-01-24 DIAGNOSIS — Z0000 Encounter for general adult medical examination without abnormal findings: Secondary | ICD-10-CM

## 2013-01-24 DIAGNOSIS — Z2911 Encounter for prophylactic immunotherapy for respiratory syncytial virus (RSV): Secondary | ICD-10-CM

## 2013-01-24 MED ORDER — FLUTICASONE PROPIONATE 50 MCG/ACT NA SUSP: 2.0000 | Freq: Every day | NASAL | Status: DC

## 2013-01-24 NOTE — Progress Notes (Signed)
Subjective:    Patient ID: Dana Dyer, female    DOB: January 11, 1951, 62 y.o.   MRN: [id number]  HPI  Patient is here for wellness exam. She's had previous hysterectomy for benign disease and no indication for Pap smears. She is scheduled for repeat mammography in March. She had colonoscopy about 8 years ago. She has up-to-date immunizations with the exception of shingles vaccine and she wishes to proceed with that today.  Diabetes is improving. She recently stopped her Humalog because of improving and occasional low blood sugars. She still remains on Lantus and metformin. She starting to walk more now that she is in retirement. Fasting blood sugars mostly low 130.  Past Medical History  Diagnosis Date  . DIABETES MELLITUS, UNCONTROLLED 03/16/2009  . HYPERLIPIDEMIA 10/09/2007  . OBSTRUCTIVE SLEEP APNEA 10/09/2007  . HYPERTENSION 10/09/2007  . ALLERGIC RHINITIS 10/09/2007  . FIBROMYALGIA 10/09/2007  . FATIGUE 10/21/2008  . TRANSAMINASES, SERUM, ELEVATED 07/08/2009   Past Surgical History  Procedure Laterality Date  . Tonsillectomy  1959  . Mamoplasty      reduction  . Basal cell carcinoma excision    . Abdominal hysterectomy  1979    prolapsed uterus    reports that she has never smoked. She does not have any smokeless tobacco history on file. Her alcohol and drug histories are not on file. family history includes Alcohol abuse in her father; Arthritis in her father and mother; Cancer in her mother; Diabetes in her mother; Hypertension in her maternal grandfather; and 55 / Stillbirths in her paternal grandfather. Allergies  Allergen Reactions  . Amoxicillin-Pot Clavulanate     Severe diarrhea  . Codeine Sulfate     REACTION: "hyper"  . Erythromycin Base   . Irbesartan-Hydrochlorothiazide   . Latex      Review of Systems  Constitutional: Negative for fever, activity change, appetite change, fatigue and unexpected weight change.  HENT: Negative for hearing loss, ear  pain, sore throat and trouble swallowing.   Eyes: Negative for visual disturbance.  Respiratory: Negative for cough and shortness of breath.   Cardiovascular: Negative for chest pain and palpitations.  Gastrointestinal: Negative for abdominal pain, diarrhea, constipation and blood in stool.  Genitourinary: Negative for dysuria and hematuria.  Musculoskeletal: Negative for myalgias, back pain and arthralgias.  Skin: Negative for rash.  Neurological: Negative for dizziness, syncope and headaches.  Hematological: Negative for adenopathy.  Psychiatric/Behavioral: Negative for confusion and dysphoric mood.       Objective:   Physical Exam  Constitutional: She is oriented to person, place, and time. She appears well-developed and well-nourished.  HENT:  Head: Normocephalic and atraumatic.  Eyes: EOM are normal. Pupils are equal, round, and reactive to light.  Neck: Normal range of motion. Neck supple. No thyromegaly present.  Cardiovascular: Normal rate, regular rhythm and normal heart sounds.   No murmur heard. Pulmonary/Chest: Breath sounds normal. No respiratory distress. She has no wheezes. She has no rales.  Abdominal: Soft. Bowel sounds are normal. She exhibits no distension and no mass. There is no tenderness. There is no rebound and no guarding.  Genitourinary:  Breast no mass  Musculoskeletal: Normal range of motion. She exhibits no edema.  Lymphadenopathy:    She has no cervical adenopathy.  Neurological: She is alert and oriented to person, place, and time. She displays normal reflexes. No cranial nerve deficit.  Skin: No rash noted.  Psychiatric: She has a normal mood and affect. Her behavior is normal. Judgment and thought content normal.  Assessment & Plan:  Health maintenance. Shingles vaccine will be given. Schedule DEXA scan with upcoming mammogram. Hemoccults given. Continue regular exercise habits. Routine followup in 3 months and reassess A1c then. Labs  reviewed with patient and many improvements over past 2 years.

## 2013-01-29 ENCOUNTER — Other Ambulatory Visit: Payer: Self-pay | Admitting: *Deleted

## 2013-01-29 DIAGNOSIS — IMO0001 Reserved for inherently not codable concepts without codable children: Secondary | ICD-10-CM

## 2013-01-31 ENCOUNTER — Other Ambulatory Visit: Payer: Self-pay | Admitting: Family Medicine

## 2013-02-04 ENCOUNTER — Ambulatory Visit: Payer: BC Managed Care – PPO

## 2013-02-11 ENCOUNTER — Other Ambulatory Visit: Payer: BC Managed Care – PPO

## 2013-03-01 ENCOUNTER — Ambulatory Visit
Admission: RE | Admit: 2013-03-01 | Discharge: 2013-03-01 | Disposition: A | Discharge status: Home / Self Care | Payer: BC Managed Care – PPO | Source: Ambulatory Visit | Attending: Family Medicine | Admitting: Family Medicine

## 2013-03-01 DIAGNOSIS — Z1231 Encounter for screening mammogram for malignant neoplasm of breast: Secondary | ICD-10-CM

## 2013-03-01 DIAGNOSIS — Z0000 Encounter for general adult medical examination without abnormal findings: Secondary | ICD-10-CM

## 2013-03-04 ENCOUNTER — Encounter: Payer: Self-pay | Admitting: Family Medicine

## 2013-03-21 ENCOUNTER — Other Ambulatory Visit: Payer: Self-pay | Admitting: Family Medicine

## 2013-03-31 ENCOUNTER — Encounter: Payer: Self-pay | Admitting: Family Medicine

## 2013-04-04 ENCOUNTER — Other Ambulatory Visit (INDEPENDENT_AMBULATORY_CARE_PROVIDER_SITE_OTHER): Payer: BC Managed Care – PPO

## 2013-04-04 ENCOUNTER — Encounter: Payer: Self-pay | Admitting: Family Medicine

## 2013-04-04 DIAGNOSIS — IMO0001 Reserved for inherently not codable concepts without codable children: Secondary | ICD-10-CM

## 2013-04-11 ENCOUNTER — Encounter: Payer: Self-pay | Admitting: Family Medicine

## 2013-04-11 ENCOUNTER — Ambulatory Visit (INDEPENDENT_AMBULATORY_CARE_PROVIDER_SITE_OTHER): Payer: BC Managed Care – PPO | Admitting: Family Medicine

## 2013-04-11 VITALS — BP 120/68 | Temp 98.0°F | Ht 63.0 in | Wt 182.0 lb

## 2013-04-11 DIAGNOSIS — IMO0001 Reserved for inherently not codable concepts without codable children: Secondary | ICD-10-CM

## 2013-04-11 NOTE — Progress Notes (Signed)
  Subjective:    Patient ID: Dana Dyer, female    DOB: 05/24/1951, 62 y.o.   MRN: [id number]  HPI Patient here for followup type 2 diabetes. Currently taking Lantus 52 units once daily. She dropped taking her Humalog because of a couple of episodes of low blood sugar after exercise. Fasting blood sugars consistently run 130. No symptoms of hyperglycemia. Blood pressures been very well controlled. A1c 7.9%. Last eye exam last October. No complaints at this time  Past Medical History  Diagnosis Date  . DIABETES MELLITUS, UNCONTROLLED 03/16/2009  . HYPERLIPIDEMIA 10/09/2007  . OBSTRUCTIVE SLEEP APNEA 10/09/2007  . HYPERTENSION 10/09/2007  . ALLERGIC RHINITIS 10/09/2007  . FIBROMYALGIA 10/09/2007  . FATIGUE 10/21/2008  . TRANSAMINASES, SERUM, ELEVATED 07/08/2009   Past Surgical History  Procedure Laterality Date  . Tonsillectomy  1959  . Mamoplasty      reduction  . Basal cell carcinoma excision    . Abdominal hysterectomy  1979    prolapsed uterus    reports that she has never smoked. She does not have any smokeless tobacco history on file. Her alcohol and drug histories are not on file. family history includes Alcohol abuse in her father; Arthritis in her father and mother; Cancer in her mother; Diabetes in her mother; Hypertension in her maternal grandfather; and 67 / Stillbirths in her paternal grandfather. Allergies  Allergen Reactions  . Amoxicillin-Pot Clavulanate     Severe diarrhea  . Codeine Sulfate     REACTION: "hyper"  . Erythromycin Base   . Irbesartan-Hydrochlorothiazide   . Latex       Review of Systems  Constitutional: Negative for fatigue.  Eyes: Negative for visual disturbance.  Respiratory: Negative for cough, chest tightness, shortness of breath and wheezing.   Cardiovascular: Negative for chest pain, palpitations and leg swelling.  Neurological: Negative for dizziness, seizures, syncope, weakness, light-headedness and headaches.        Objective:   Physical Exam  Constitutional: She appears well-developed and well-nourished.  Cardiovascular: Normal rate and regular rhythm.   Pulmonary/Chest: Effort normal and breath sounds normal. No respiratory distress. She has no wheezes. She has no rales.  Musculoskeletal: She exhibits no edema.  Skin:  Feet reveal no skin lesions. Good distal foot pulses. Good capillary refill. No calluses. Normal sensation with monofilament testing           Assessment & Plan:  Type 2 diabetes. Fair control. We have suggested she add back low-dose Humalog, especially days she does not exercise. Recheck A1c in 3 months. Try to lose some weight

## 2013-05-10 ENCOUNTER — Encounter: Payer: Self-pay | Admitting: Family Medicine

## 2013-07-08 ENCOUNTER — Other Ambulatory Visit: Payer: Self-pay | Admitting: Family Medicine

## 2013-07-12 ENCOUNTER — Other Ambulatory Visit (INDEPENDENT_AMBULATORY_CARE_PROVIDER_SITE_OTHER): Payer: BC Managed Care – PPO

## 2013-07-12 DIAGNOSIS — IMO0001 Reserved for inherently not codable concepts without codable children: Secondary | ICD-10-CM

## 2013-07-12 LAB — HEMOGLOBIN A1C: Hgb A1c MFr Bld: 8.6 % — ABNORMAL HIGH (ref 4.6–6.5)

## 2013-07-25 ENCOUNTER — Other Ambulatory Visit: Payer: Self-pay | Admitting: Dermatology

## 2013-08-06 ENCOUNTER — Encounter: Payer: Self-pay | Admitting: Family Medicine

## 2013-08-06 ENCOUNTER — Ambulatory Visit (INDEPENDENT_AMBULATORY_CARE_PROVIDER_SITE_OTHER): Payer: BC Managed Care – PPO | Admitting: Family Medicine

## 2013-08-06 VITALS — BP 132/88 | HR 84 | Temp 98.5°F | Wt 180.0 lb

## 2013-08-06 DIAGNOSIS — I10 Essential (primary) hypertension: Secondary | ICD-10-CM

## 2013-08-06 DIAGNOSIS — Z23 Encounter for immunization: Secondary | ICD-10-CM

## 2013-08-06 DIAGNOSIS — J069 Acute upper respiratory infection, unspecified: Secondary | ICD-10-CM

## 2013-08-06 MED ORDER — ALBUTEROL SULFATE HFA 108 (90 BASE) MCG/ACT IN AERS: 2.0000 | INHALATION_SPRAY | Freq: Four times a day (QID) | RESPIRATORY_TRACT | Status: DC | PRN

## 2013-08-06 MED ORDER — BENZONATATE 100 MG PO CAPS: 100.0000 mg | ORAL_CAPSULE | Freq: Two times a day (BID) | ORAL | Status: DC | PRN

## 2013-08-06 NOTE — Patient Instructions (Signed)

## 2013-08-06 NOTE — Progress Notes (Signed)
Chief Complaint  Patient presents with  . Allergic Rhinitis   . chest congestion    HPI:  Acute visit for nasal congestion: -started about 1 week ago -symptoms: congestion, cough, drainage in throat, sinus pressure, did feel a little wheezy - improving -denies:fevers, SOB, NVD, tooth pain or sinus pressure -has tried claritin and robitussin  ROS: See pertinent positives and negatives per HPI.  Past Medical History  Diagnosis Date  . DIABETES MELLITUS, UNCONTROLLED 03/16/2009  . HYPERLIPIDEMIA 10/09/2007  . OBSTRUCTIVE SLEEP APNEA 10/09/2007  . HYPERTENSION 10/09/2007  . ALLERGIC RHINITIS 10/09/2007  . FIBROMYALGIA 10/09/2007  . FATIGUE 10/21/2008  . TRANSAMINASES, SERUM, ELEVATED 07/08/2009    Past Surgical History  Procedure Laterality Date  . Tonsillectomy  1959  . Mamoplasty      reduction  . Basal cell carcinoma excision    . Abdominal hysterectomy  1979    prolapsed uterus    Family History  Problem Relation Age of Onset  . Diabetes Mother   . Arthritis Mother   . Cancer Mother     breast  . Alcohol abuse Father   . Arthritis Father   . Hypertension Maternal Grandfather   . Miscarriages / Stillbirths Paternal Grandfather     History   Social History  . Marital Status: Married    Spouse Name: N/A    Number of Children: N/A  . Years of Education: N/A   Social History Main Topics  . Smoking status: Never Smoker   . Smokeless tobacco: None  . Alcohol Use: None  . Drug Use: None  . Sexual Activity: None   Other Topics Concern  . None   Social History Narrative  . None    Current outpatient prescriptions:amLODipine (NORVASC) 5 MG tablet, TAKE ONE TABLET BY MOUTH ONE TIME DAILY, Disp: 90 tablet, Rfl: 3;  CRESTOR 10 MG tablet, TAKE ONE TABLET BY MOUTH ONE TIME DAILY, Disp: 90 tablet, Rfl: 3;  escitalopram (LEXAPRO) 10 MG tablet, TAKE ONE TABLET BY MOUTH ONE TIME DAILY, Disp: 90 tablet, Rfl: 3;  fluorouracil (EFUDEX) 5 % cream, Apply topically daily. Per  Dermatology, Wilhemina Bonito, MD, Disp: , Rfl:  fluticasone (FLONASE) 50 MCG/ACT nasal spray, Place 2 sprays into the nose daily., Disp: 16 g, Rfl: 11;  HUMALOG KWIKPEN 100 UNIT/ML SOPN, Inject into skin 5 units with lunch and 5 units with supper as directed, Disp: 15 mL, Rfl: 11;  insulin glargine (LANTUS SOLOSTAR) 100 UNIT/ML injection, 46 units SQ every morning, Disp: , Rfl:  insulin lispro (HUMALOG PEN) 100 UNIT/ML injection, Inject into skin 5 units with lunch and 5 units with supper, Disp: 10 mL, Rfl: 12;  Insulin Pen Needle (BD ULTRA-FINE PEN NEEDLES) 29G X 12.7MM MISC, Pt it injecting insulin three times a day, Disp: 300 each, Rfl: 12;  Lancets (ONETOUCH ULTRASOFT) lancets, Use as instructed twice daily, Disp: 100 each, Rfl: 12 LANTUS SOLOSTAR 100 UNIT/ML SOPN, Inject 56 units daily as directed., Disp: 15 mL, Rfl: 3;  losartan-hydrochlorothiazide (HYZAAR) 100-25 MG per tablet, TAKE ONE TABLET BY MOUTH ONE TIME DAILY, Disp: 90 tablet, Rfl: 3;  metFORMIN (GLUCOPHAGE) 1000 MG tablet, Take 1 tablet (1,000 mg total) by mouth 2 (two) times daily with a meal., Disp: 180 tablet, Rfl: 2 metFORMIN (GLUCOPHAGE) 1000 MG tablet, TAKE 1 TABLET BY MOUTH TWICE DAILY. NEEDS OFFICE VISIT FOR MOREREFILLS., Disp: 180 tablet, Rfl: 2;  metroNIDAZOLE (METROGEL) 0.75 % gel, Apply 1 application topically. Per Dermatology, Wilhemina Bonito, MD, Disp: , Rfl: ;  albuterol (  PROVENTIL HFA;VENTOLIN HFA) 108 (90 BASE) MCG/ACT inhaler, Inhale 2 puffs into the lungs every 6 (six) hours as needed for wheezing., Disp: 1 Inhaler, Rfl: 0 benzonatate (TESSALON) 100 MG capsule, Take 1 capsule (100 mg total) by mouth 2 (two) times daily as needed for cough., Disp: 20 capsule, Rfl: 0 Current facility-administered medications:TDaP (BOOSTRIX) injection 0.5 mL, 0.5 mL, Intramuscular, Once, Eulas Post, MD  EXAM:  Filed Vitals:   08/06/13 1400  BP: 132/88  Pulse: 84  Temp: 98.5 F (36.9 C)    Body mass index is 31.89 kg/(m^2).  GENERAL:  vitals reviewed and listed above, alert, oriented, appears well hydrated and in no acute distress  HEENT: atraumatic, conjunttiva clear, no obvious abnormalities on inspection of external nose and ears, normal appearance of ear canals and TMs, clear nasal congestion, mild post oropharyngeal erythema with PND, no tonsillar edema or exudate, no sinus TTP  NECK: no obvious masses on inspection  LUNGS: clear to auscultation bilaterally, no wheezes, rales or rhonchi, good air movement  CV: HRRR, no peripheral edema  MS: moves all extremities without noticeable abnormality  PSYCH: pleasant and cooperative, no obvious depression or anxiety  ASSESSMENT AND PLAN:  Discussed the following assessment and plan:  Upper respiratory infection - Plan: benzonatate (TESSALON) 100 MG capsule, albuterol (PROVENTIL HFA;VENTOLIN HFA) 108 (90 BASE) MCG/ACT inhaler  HYPERTENSION  -VURI likely, possible mild RAD though exam normal today -advised supportive care measures and return precuations -flu shot today -BP fine on recheck -Patient advised to return or notify a doctor immediately if symptoms worsen or persist or new concerns arise.  Patient Instructions  INSTRUCTIONS FOR UPPER RESPIRATORY INFECTION:  -plenty of rest and fluids  -nasal saline wash 2-3 times daily (use prepackaged nasal saline or bottled/distilled water if making your own)   -can use afrin nasal spray for drainage and nasal congestion - but do NOT use longer then 3-4 days  -can use tylenol or ibuprofen as directed for aches and sorethroat  -in the winter time, using a humidifier at night is helpful (please follow cleaning instructions)  -if you are taking a cough medication - use only as directed, may also try a teaspoon of honey to coat the throat and throat lozenges  -for sore throat, salt water gargles can help  -follow up if you have fevers, facial pain, tooth pain, difficulty breathing or are worsening or not getting  better in 5-7 days      ,  R.

## 2013-08-06 NOTE — Addendum Note (Signed)
Addended by: Westley Hummer B on: 08/06/2013 02:34 PM   Modules accepted: Orders

## 2013-08-08 ENCOUNTER — Telehealth: Payer: Self-pay | Admitting: Family Medicine

## 2013-08-08 NOTE — Telephone Encounter (Signed)
This may be all viral, but if not getting any better may call in Levaquin 500 mg once daily (she has multiple drug allergies) for 7 days.

## 2013-08-08 NOTE — Telephone Encounter (Signed)
Patient Information:  Caller Name: Quilla  Phone: [phone]  Patient: Dana Dyer  Gender: Female  DOB: 02-14-1951  Age: 62 Years  PCP: Colin Benton [facility])  Office Follow Up:  Does the office need to follow up with this patient?: Yes  Instructions For The Office: PLEASE F/U WITH PT [place].  THANK YOU.   Symptoms  Reason For Call & Symptoms: Pt states she was seen in the office 08/07/13 and the cough and congestion is not getting any better.  Pt states she is unable to sleep for the coughing.  Pt requestion antibiotic called in.  Reviewed Health History In EMR: Yes  Reviewed Medications In EMR: Yes  Reviewed Allergies In EMR: Yes  Reviewed Surgeries / Procedures: Yes  Date of Onset of Symptoms: 08/06/2013  Guideline(s) Used:  No Protocol Available - Sick Adult  Disposition Per Guideline:   Discuss with PCP and Callback by Nurse Today  Reason For Disposition Reached:   Nursing judgment  Advice Given:  Call Back If:  New symptoms develop  You become worse.  Patient Refused Recommendation:  Patient Requests Prescription  Pt requesting Rx antibiotic.

## 2013-08-09 MED ORDER — LEVOFLOXACIN 500 MG PO TABS: 500.0000 mg | ORAL_TABLET | Freq: Every day | ORAL | Status: DC

## 2013-08-09 NOTE — Telephone Encounter (Signed)
Sent rx to pharmacy

## 2013-08-12 ENCOUNTER — Ambulatory Visit (INDEPENDENT_AMBULATORY_CARE_PROVIDER_SITE_OTHER): Payer: BC Managed Care – PPO | Admitting: Internal Medicine

## 2013-08-12 ENCOUNTER — Encounter: Payer: Self-pay | Admitting: Internal Medicine

## 2013-08-12 VITALS — BP 110/62 | HR 104 | Temp 99.2°F | Ht 62.5 in | Wt 181.0 lb

## 2013-08-12 DIAGNOSIS — R0609 Other forms of dyspnea: Secondary | ICD-10-CM

## 2013-08-12 DIAGNOSIS — R0989 Other specified symptoms and signs involving the circulatory and respiratory systems: Secondary | ICD-10-CM

## 2013-08-12 DIAGNOSIS — R0600 Dyspnea, unspecified: Secondary | ICD-10-CM

## 2013-08-12 DIAGNOSIS — J45909 Unspecified asthma, uncomplicated: Secondary | ICD-10-CM | POA: Insufficient documentation

## 2013-08-12 MED ORDER — PREDNISONE (PAK) 10 MG PO TABS: ORAL_TABLET | ORAL | Status: DC

## 2013-08-12 MED ORDER — MOMETASONE FURO-FORMOTEROL FUM 100-5 MCG/ACT IN AERO: INHALATION_SPRAY | RESPIRATORY_TRACT | Status: DC

## 2013-08-12 NOTE — Patient Instructions (Addendum)
dulera 100 Take 2 puffs first thing in am and then another 2 puffs about 12 hours later if having any respiratory symptoms  Prednisone 10 mg take  4 each am x 2 days,   2 each am x 2 days,  1 each am x 2 days and stop     Work on inhaler technique:  relax and gently blow all the way out then take a nice smooth deep breath back in, triggering the inhaler at same time you start breathing in.  Hold for up to 5 seconds if you can.  Rinse and gargle with water when done   If your mouth or throat starts to bother you,   I suggest you time the inhaler to your dental care and after using the inhaler(s) brush teeth and tongue with a baking soda containing toothpaste and when you rinse this out, gargle with it first to see if this helps your mouth and throat.     Finish levaquin but if not improving > Prednisone 10 mg take  4 each am x 2 days,   2 each am x 2 days,  1 each am x 2 days and stop   For cough mucinex dm 1200 mg every 12 hours  Try prilosec 59m  Take 30-60 min before first meal of the day and Pepcid 20 mg one bedtime until cough is completely gone for at least a week without the need for cough suppression  GERD (REFLUX)  is an extremely common cause of respiratory symptoms, many times with no significant heartburn at all.    It can be treated with medication, but also with lifestyle changes including avoidance of late meals, excessive alcohol, smoking cessation, and avoid fatty foods, chocolate, peppermint, colas, red wine, and acidic juices such as orange juice.  NO MINT OR MENTHOL PRODUCTS SO NO COUGH DROPS  USE SUGARLESS CANDY INSTEAD (jolley ranchers or Stover's)  NO OIL BASED VITAMINS - use powdered substitutes.     Keep appointement with Dr AElsworth Soho

## 2013-08-12 NOTE — Progress Notes (Signed)
Subjective:    Patient ID: Dana Dyer, female    DOB: [birth year], 62 y.o.   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   July 09, 2010  Lost 20 lbs since study, she wears her cpap machine every night. Approx 6 hours per night. Pt states she has noticed "apnea episodes" dispite wearing cpap machine. Pt c/o dry mouth and dry eyes d/t the cpap machine ..   03/07/2012 Acute OV  Complains of recurrent sinus symptoms. Treated  with Levaquin in 12/2011 for sinus infection. Complains of headache , facial pressure, sore throat, prod. cough, PND, F/C.  For last 1 week. OTC not helping. No longer using CPAP-off for last 2 years.  rec Augmentin 877m Twice daily  For 10 days  Mucinex DM Twice daily  As needed  Cough/congestion  Hydromet 1-2 teaspoons every 4-6 hours as needed for cough, may call, sleepiness Saline nasal rinses as needed. Saline nasal gel at bedtime as needed. Begin Nasonex 2 puffs twice daily Hold Claritin for 1 week and then restart daily Followup with Dr. AElsworth Sohoin 6 weeks and as needed> did not f/u    08/12/2013 f/u ov/ no longer on cpap with h/o rhinitis but no asthma dx per pt Chief Complaint  Patient presents with  . Acute Visit    Pt c/o prod coug with thick, white to green sputum and wheezing for approx 2 wks.  Taking ventolin several times per day.   already taking levaquin 3 days left Started on alb which helps full hour to start working and lasts for 4 hours. Has used saba in past but never told she has asthma  Started with head cold > went to chest as it has in past  No obvious daytime variabilty  or assoc   cp or chest tightness  overt   hb symptoms. No unusual exp hx or h/o childhood pna/ asthma or knowledge of premature birth.   Sleeping ok without nocturnal  or early am exacerbation  of respiratory  c/o's or need for noct saba. Also denies any obvious fluctuation of symptoms with weather or environmental changes or other aggravating or alleviating factors except as outlined above   Current Medications, Allergies, Complete Past Medical History, Past Surgical History, Family History, and Social History were reviewed in [employer].  ROS  The following are not active complaints unless bolded sore throat, dysphagia, dental problems, itching, sneezing,  nasal congestion or excess/ purulent secretions, ear ache,   fever, chills, sweats, unintended wt loss, pleuritic or exertional cp, hemoptysis,  orthopnea pnd or leg swelling, presyncope, palpitations, heartburn, abdominal pain, anorexia, nausea, vomiting, diarrhea  or change in bowel or urinary habits, change in stools or urine, dysuria,hematuria,  rash, arthralgias, visual complaints, headache, numbness weakness or ataxia or problems with walking or coordination,  change in mood/affect or memory.                Objective:   Physical Exam GEN: A/Ox3; pleasant , NAD  HEENT:  [place]/AT,  EACs-clear, TMs-wnl, NOSE-clear drainage -  max sinus tenderness , THROAT-clear, no lesions, no postnasal drip or exudate noted.   NECK:  Supple w/ fair ROM; no JVD; normal carotid impulses w/o bruits; no thyromegaly or nodules palpated; no lymphadenopathy.  RESP  insp / exp rhonchi.no accessory muscle use, no dullness to percussion  CARD:  RRR, no m/r/g  , no peripheral edema, pulses intact, no cyanosis or clubbing.  GI:   Soft & nt; nml bowel sounds; no organomegaly or masses detected.  Musco: Warm bil, no deformities or joint swelling noted.   Neuro: alert, no focal deficits noted.    Skin: Warm, no lesions or  rashes         Assessment & Plan:

## 2013-08-14 NOTE — Assessment & Plan Note (Addendum)
DDX of  difficult airways managment all start with A and  include Adherence, Ace Inhibitors, Acid Reflux, Active Sinus Disease, Alpha 1 Antitripsin deficiency, Anxiety masquerading as Airways dz,  ABPA,  allergy(esp in young), Aspiration (esp in elderly), Adverse effects of DPI,  Active smokers, plus two Bs  = Bronchiectasis and Beta blocker use..and one C= CHF  Adherence is always the initial "prime suspect" and is a multilayered concern that requires a "trust but verify" approach in every patient - starting with knowing how to use medications, especially inhalers, correctly, keeping up with refills and understanding the fundamental difference between maintenance and prns vs those medications only taken for a very short course and then stopped and not refilled. The proper method of use, as well as anticipated side effects, of a metered-dose inhaler are discussed and demonstrated to the patient. Improved effectiveness after extensive coaching during this visit to a level of approximately  50% at best but ok enough on basis of response to try dulera 100 2bid at least short term  ? Acute/active sinus dz > complete levaquin, consider sinus ct  ? Acid or non - acid reflux, always difficult to exclude > max meds / diet reviewed  ? Allergies > Prednisone 10 mg take  4 each am x 2 days,   2 each am x 2 days,  1 each am x 2 days and stop, consider more aggressive topical rx or trial of singulair if recurrent /persistent   Will ask her to regroup with DrAlva re longterm rx since has not previously been characterized as Asthmatic

## 2013-09-05 ENCOUNTER — Encounter: Payer: Self-pay | Admitting: Family Medicine

## 2013-09-05 ENCOUNTER — Ambulatory Visit (INDEPENDENT_AMBULATORY_CARE_PROVIDER_SITE_OTHER): Payer: BC Managed Care – PPO | Admitting: Family Medicine

## 2013-09-05 VITALS — BP 126/80 | HR 111 | Temp 98.6°F | Wt 181.0 lb

## 2013-09-05 DIAGNOSIS — E669 Obesity, unspecified: Secondary | ICD-10-CM | POA: Insufficient documentation

## 2013-09-05 DIAGNOSIS — IMO0001 Reserved for inherently not codable concepts without codable children: Secondary | ICD-10-CM

## 2013-09-05 NOTE — Progress Notes (Signed)
  Subjective:    Patient ID: Dana Dyer, female    DOB: 1951-03-27, 62 y.o.   MRN: [id number]  HPI Patient is seen for followup type 2 diabetes. Recent A1c 8.6%. She has started back her Humalog and blood sugars have improved though she still has frequent fasting 150. She takes Lantus 54 units once daily. No hypoglycemia. Exercise somewhat inconsistent. Denies any symptoms of polydipsia or polyuria. She also remains on metformin 1000 mg twice a day  Past Medical History  Diagnosis Date  . DIABETES MELLITUS, UNCONTROLLED 03/16/2009  . HYPERLIPIDEMIA 10/09/2007  . OBSTRUCTIVE SLEEP APNEA 10/09/2007  . HYPERTENSION 10/09/2007  . ALLERGIC RHINITIS 10/09/2007  . FIBROMYALGIA 10/09/2007  . FATIGUE 10/21/2008  . TRANSAMINASES, SERUM, ELEVATED 07/08/2009   Past Surgical History  Procedure Laterality Date  . Tonsillectomy  1959  . Mamoplasty      reduction  . Basal cell carcinoma excision    . Abdominal hysterectomy  1979    prolapsed uterus    reports that she has never smoked. She does not have any smokeless tobacco history on file. Her alcohol and drug histories are not on file. family history includes Alcohol abuse in her father; Arthritis in her father and mother; Cancer in her mother; Diabetes in her mother; Hypertension in her maternal grandfather; Miscarriages / [place] in her paternal grandfather. Allergies  Allergen Reactions  . Amoxicillin-Pot Clavulanate     Severe diarrhea  . Codeine Sulfate     REACTION: "hyper"  . Erythromycin Base   . Irbesartan-Hydrochlorothiazide   . Latex         Review of Systems  Constitutional: Negative for fatigue.  Eyes: Negative for visual disturbance.  Respiratory: Negative for cough, chest tightness, shortness of breath and wheezing.   Cardiovascular: Negative for chest pain, palpitations and leg swelling.  Neurological: Negative for dizziness, seizures, syncope, weakness, light-headedness and headaches.       Objective:   Physical Exam  Constitutional: She appears well-developed and well-nourished.  Cardiovascular: Normal rate and regular rhythm.   Pulmonary/Chest: Effort normal and breath sounds normal. No respiratory distress. She has no wheezes. She has no rales.  Musculoskeletal: She exhibits no edema.          Assessment & Plan:  Type 2 diabetes. Suboptimal control. Titrate Lantus up 2 units every 3 days until fastings consistently less than 130. Continue Humalog 5 units twice daily with meals and we may consider further titration. Repeat A1c in one month Continue efforts to lose some weight.

## 2013-09-05 NOTE — Patient Instructions (Addendum)
Titrate up your Lantus 2 units every 3 days until fasting sugars are consistently < 130.

## 2013-09-18 ENCOUNTER — Ambulatory Visit: Payer: BC Managed Care – PPO | Admitting: Pulmonary Disease

## 2013-10-10 ENCOUNTER — Other Ambulatory Visit: Payer: Self-pay | Admitting: Family Medicine

## 2013-10-19 ENCOUNTER — Other Ambulatory Visit: Payer: Self-pay | Admitting: Family Medicine

## 2013-11-19 ENCOUNTER — Telehealth: Payer: Self-pay | Admitting: Family Medicine

## 2013-11-19 NOTE — Telephone Encounter (Signed)
[facility] Triage Call Report Triage Record Num: [id number] Operator: Marianne Sofia Patient Name: Dana Dyer Call Date & Time: 11/16/2013 5:12:11PM Patient Phone: [phone] PCP: Carolann Littler Patient Gender: Female PCP Fax : [phone] Patient DOB: 09-Sep-1951 Practice Name: [facility] Reason for Call: Caller: [name]; PCP: Carolann Littler (Family Practice); CB#: [phone]; Call regarding Vomiting that started today 11/16/13, at 11:00, emesis x 6, feels as if she might vomit with movement, took insulin this am as scheduled and has not checked her blood sugar yet,ED disposition obtained per DM,GI Guideline due to vomiting > 2 hrs, she is hesitant and states she may try to work on self at home, advised to follow recommendation and go to ED per disposition Protocol(s) Used: Diabetes: Gastrointestinal Problems Recommended Outcome per Protocol: See ED Immediately Reason for Outcome: Vomiting multiple times OR unable to keep fluids down for more than 2 hours Care Advice: ~ 12/

## 2013-11-19 NOTE — Telephone Encounter (Signed)
Called left message for patient to return call

## 2013-11-19 NOTE — Telephone Encounter (Signed)
Left message for patient to return call.

## 2013-11-21 ENCOUNTER — Encounter: Payer: Self-pay | Admitting: Family Medicine

## 2013-11-27 ENCOUNTER — Other Ambulatory Visit: Payer: Self-pay | Admitting: Family Medicine

## 2013-12-09 ENCOUNTER — Other Ambulatory Visit (INDEPENDENT_AMBULATORY_CARE_PROVIDER_SITE_OTHER): Payer: BC Managed Care – PPO

## 2013-12-09 ENCOUNTER — Encounter: Payer: Self-pay | Admitting: Family Medicine

## 2013-12-09 DIAGNOSIS — E1165 Type 2 diabetes mellitus with hyperglycemia: Principal | ICD-10-CM

## 2013-12-09 DIAGNOSIS — IMO0001 Reserved for inherently not codable concepts without codable children: Secondary | ICD-10-CM

## 2013-12-09 LAB — HEMOGLOBIN A1C: Hgb A1c MFr Bld: 8.1 % — ABNORMAL HIGH (ref 4.6–6.5)

## 2013-12-11 ENCOUNTER — Telehealth: Payer: Self-pay | Admitting: *Deleted

## 2013-12-20 ENCOUNTER — Other Ambulatory Visit: Payer: Self-pay | Admitting: Family Medicine

## 2014-01-03 ENCOUNTER — Other Ambulatory Visit: Payer: Self-pay | Admitting: Family Medicine

## 2014-02-04 ENCOUNTER — Telehealth: Payer: Self-pay | Admitting: Family Medicine

## 2014-02-04 ENCOUNTER — Other Ambulatory Visit: Payer: Self-pay | Admitting: Family Medicine

## 2014-02-04 ENCOUNTER — Other Ambulatory Visit: Payer: Self-pay

## 2014-02-04 DIAGNOSIS — E1165 Type 2 diabetes mellitus with hyperglycemia: Principal | ICD-10-CM

## 2014-02-04 DIAGNOSIS — IMO0001 Reserved for inherently not codable concepts without codable children: Secondary | ICD-10-CM

## 2014-02-04 DIAGNOSIS — Z1231 Encounter for screening mammogram for malignant neoplasm of breast: Secondary | ICD-10-CM

## 2014-02-04 MED ORDER — INSULIN PEN NEEDLE 29G X 12.7MM MISC: Status: DC

## 2014-02-04 NOTE — Telephone Encounter (Signed)
Order is placed.

## 2014-02-04 NOTE — Telephone Encounter (Signed)
RX sent to pharmacy  

## 2014-02-04 NOTE — Telephone Encounter (Signed)
Is it okay to order?

## 2014-02-04 NOTE — Telephone Encounter (Signed)
Pt needs refill on bd ultra fine needles 29g x12.46m call into [facility]

## 2014-02-04 NOTE — Telephone Encounter (Signed)
Pt states she comes every three mos for a1c. Can you put the orders in if this is ok?

## 2014-02-04 NOTE — Telephone Encounter (Signed)
Yes,  Go ahead and order q 38month for one year.

## 2014-02-06 ENCOUNTER — Telehealth: Payer: Self-pay | Admitting: Family Medicine

## 2014-02-06 MED ORDER — GLUCOSE BLOOD VI STRP: ORAL_STRIP | Status: DC

## 2014-02-06 NOTE — Telephone Encounter (Signed)
Pt request test strips one touch ultra  Machine is one touch mini Pt test once /day 90 day supply [facility] [address]

## 2014-02-06 NOTE — Telephone Encounter (Signed)
Rx sent to pharmacy   

## 2014-02-09 ENCOUNTER — Other Ambulatory Visit: Payer: Self-pay | Admitting: Family Medicine

## 2014-03-12 ENCOUNTER — Ambulatory Visit
Admission: RE | Admit: 2014-03-12 | Discharge: 2014-03-12 | Disposition: A | Discharge status: Home / Self Care | Payer: BC Managed Care – PPO | Source: Ambulatory Visit

## 2014-03-12 ENCOUNTER — Other Ambulatory Visit (INDEPENDENT_AMBULATORY_CARE_PROVIDER_SITE_OTHER): Payer: BC Managed Care – PPO

## 2014-03-12 ENCOUNTER — Encounter: Payer: Self-pay | Admitting: Family Medicine

## 2014-03-12 DIAGNOSIS — IMO0001 Reserved for inherently not codable concepts without codable children: Secondary | ICD-10-CM

## 2014-03-12 DIAGNOSIS — E1165 Type 2 diabetes mellitus with hyperglycemia: Principal | ICD-10-CM

## 2014-03-12 DIAGNOSIS — Z1231 Encounter for screening mammogram for malignant neoplasm of breast: Secondary | ICD-10-CM

## 2014-03-12 LAB — HEMOGLOBIN A1C: Hgb A1c MFr Bld: 8.7 % — ABNORMAL HIGH (ref 4.6–6.5)

## 2014-03-12 NOTE — Telephone Encounter (Signed)
I spoke with pt and gave results.  

## 2014-03-31 ENCOUNTER — Other Ambulatory Visit: Payer: Self-pay | Admitting: Family Medicine

## 2014-06-09 ENCOUNTER — Other Ambulatory Visit: Payer: BC Managed Care – PPO

## 2014-06-11 ENCOUNTER — Other Ambulatory Visit: Payer: Self-pay | Admitting: *Deleted

## 2014-06-11 DIAGNOSIS — Z0000 Encounter for general adult medical examination without abnormal findings: Secondary | ICD-10-CM

## 2014-06-12 ENCOUNTER — Other Ambulatory Visit (INDEPENDENT_AMBULATORY_CARE_PROVIDER_SITE_OTHER): Payer: BC Managed Care – PPO

## 2014-06-12 DIAGNOSIS — Z0000 Encounter for general adult medical examination without abnormal findings: Secondary | ICD-10-CM

## 2014-06-12 DIAGNOSIS — IMO0001 Reserved for inherently not codable concepts without codable children: Secondary | ICD-10-CM

## 2014-06-12 DIAGNOSIS — E1165 Type 2 diabetes mellitus with hyperglycemia: Principal | ICD-10-CM

## 2014-06-12 LAB — MICROALBUMIN / CREATININE URINE RATIO
CREATININE, U: 164.3 mg/dL
MICROALB/CREAT RATIO: 0.2 mg/g (ref 0.0–30.0)
Microalb, Ur: 0.4 mg/dL (ref 0.0–1.9)

## 2014-06-12 LAB — POCT URINALYSIS DIPSTICK
Bilirubin, UA: NEGATIVE
Blood, UA: NEGATIVE
Glucose, UA: NEGATIVE
KETONES UA: NEGATIVE
Leukocytes, UA: NEGATIVE
Nitrite, UA: NEGATIVE
PROTEIN UA: NEGATIVE
SPEC GRAV UA: 1.01
Urobilinogen, UA: 1
pH, UA: 6

## 2014-06-12 LAB — BASIC METABOLIC PANEL
BUN: 14 mg/dL (ref 6–23)
CO2: 29 mEq/L (ref 19–32)
CREATININE: 0.7 mg/dL (ref 0.4–1.2)
Calcium: 9.5 mg/dL (ref 8.4–10.5)
Chloride: 97 mEq/L (ref 96–112)
GFR: 95.59 mL/min (ref >60.00)
Glucose, Bld: 168 mg/dL — ABNORMAL HIGH (ref 70–99)
Potassium: 3.6 mEq/L (ref 3.5–5.1)
Sodium: 135 mEq/L (ref 135–145)

## 2014-06-12 LAB — CBC WITH DIFFERENTIAL/PLATELET
Basophils Absolute: 0 10*3/uL (ref 0.0–0.1)
Basophils Relative: 0.2 % (ref 0.0–3.0)
EOS PCT: 2.4 % (ref 0.0–5.0)
Eosinophils Absolute: 0.2 10*3/uL (ref 0.0–0.7)
HCT: 39 % (ref 36.0–46.0)
Hemoglobin: 13.2 g/dL (ref 12.0–15.0)
LYMPHS PCT: 35.3 % (ref 12.0–46.0)
Lymphs Abs: 2.4 10*3/uL (ref 0.7–4.0)
MCHC: 34 g/dL (ref 30.0–36.0)
MCV: 86.7 fl (ref 78.0–100.0)
MONO ABS: 0.6 10*3/uL (ref 0.1–1.0)
MONOS PCT: 8.8 % (ref 3.0–12.0)
NEUTROS PCT: 53.3 % (ref 43.0–77.0)
Neutro Abs: 3.7 10*3/uL (ref 1.4–7.7)
PLATELETS: 242 10*3/uL (ref 150.0–400.0)
RBC: 4.5 Mil/uL (ref 3.87–5.11)
RDW: 13.4 % (ref 11.5–15.5)
WBC: 6.9 10*3/uL (ref 4.0–10.5)

## 2014-06-12 LAB — LIPID PANEL
CHOLESTEROL: 254 mg/dL — AB (ref 0–200)
HDL: 52.5 mg/dL (ref >39.00)
LDL Cholesterol: 170 mg/dL — ABNORMAL HIGH (ref 0–99)
NonHDL: 201.5
Total CHOL/HDL Ratio: 5
Triglycerides: 159 mg/dL — ABNORMAL HIGH (ref 0.0–149.0)
VLDL: 31.8 mg/dL (ref 0.0–40.0)

## 2014-06-12 LAB — HEPATIC FUNCTION PANEL
ALT: 51 U/L — ABNORMAL HIGH (ref 0–35)
AST: 41 U/L — AB (ref 0–37)
Albumin: 4 g/dL (ref 3.5–5.2)
Alkaline Phosphatase: 65 U/L (ref 39–117)
BILIRUBIN DIRECT: 0.2 mg/dL (ref 0.0–0.3)
BILIRUBIN TOTAL: 1.2 mg/dL (ref 0.2–1.2)
TOTAL PROTEIN: 7.2 g/dL (ref 6.0–8.3)

## 2014-06-12 LAB — TSH: TSH: 2.24 u[IU]/mL (ref 0.35–4.50)

## 2014-06-12 LAB — HEMOGLOBIN A1C: Hgb A1c MFr Bld: 8.3 % — ABNORMAL HIGH (ref 4.6–6.5)

## 2014-06-16 ENCOUNTER — Encounter: Payer: Self-pay | Admitting: Family Medicine

## 2014-06-16 ENCOUNTER — Ambulatory Visit (INDEPENDENT_AMBULATORY_CARE_PROVIDER_SITE_OTHER): Payer: BC Managed Care – PPO | Admitting: Family Medicine

## 2014-06-16 VITALS — BP 130/84 | HR 84 | Temp 97.9°F | Ht 62.0 in | Wt 183.0 lb

## 2014-06-16 DIAGNOSIS — E1165 Type 2 diabetes mellitus with hyperglycemia: Secondary | ICD-10-CM

## 2014-06-16 DIAGNOSIS — IMO0001 Reserved for inherently not codable concepts without codable children: Secondary | ICD-10-CM

## 2014-06-16 DIAGNOSIS — Z0000 Encounter for general adult medical examination without abnormal findings: Secondary | ICD-10-CM

## 2014-06-16 MED ORDER — ROSUVASTATIN CALCIUM 10 MG PO TABS: 10.0000 mg | ORAL_TABLET | Freq: Every day | ORAL | Status: DC

## 2014-06-16 MED ORDER — CANAGLIFLOZIN 100 MG PO TABS: 1.0000 | ORAL_TABLET | Freq: Every day | ORAL | Status: DC

## 2014-06-16 NOTE — Progress Notes (Signed)
Subjective:    Patient ID: Dana Dyer, female    DOB: [birth year], 63 y.o.   MRN: [id number]  HPI Here for complete physical. She has chronic problems including obesity, type 2 diabetes, hypertension, dyslipidemia, obstructive sleep apnea. Her weight has been relatively stable. She is exercising fairly regularly. Blood sugars are still elevated frequently fasting. She has not been taking her Crestor regularly.  Colonoscopy 2006 with recommended 10 year followup. She is getting regular mammograms. Previous hysterectomy. Nonsmoker. Walks for exercise. Other vaccinations up-to-date.  Past Medical History  Diagnosis Date  . DIABETES MELLITUS, UNCONTROLLED 03/16/2009  . HYPERLIPIDEMIA 10/09/2007  . OBSTRUCTIVE SLEEP APNEA 10/09/2007  . HYPERTENSION 10/09/2007  . ALLERGIC RHINITIS 10/09/2007  . FIBROMYALGIA 10/09/2007  . FATIGUE 10/21/2008  . TRANSAMINASES, SERUM, ELEVATED 07/08/2009   Past Surgical History  Procedure Laterality Date  . Tonsillectomy  1959  . Mamoplasty      reduction  . Basal cell carcinoma excision    . Abdominal hysterectomy  1979    prolapsed uterus    reports that she has never smoked. She does not have any smokeless tobacco history on file. Her alcohol and drug histories are not on file. family history includes Alcohol abuse in her father; Arthritis in her father and mother; Cancer in her mother; Diabetes in her mother; Hypertension in her maternal grandfather; Miscarriages / [place] in her paternal grandfather. Allergies  Allergen Reactions  . Amoxicillin-Pot Clavulanate     Severe diarrhea  . Codeine Sulfate     REACTION: "hyper"  . Erythromycin Base   . Irbesartan-Hydrochlorothiazide   . Latex       Review of Systems  Constitutional: Negative for fever, activity change, appetite change, fatigue and unexpected weight change.  HENT: Negative for ear pain, hearing loss, sore throat and trouble swallowing.   Eyes: Negative for visual disturbance.    Respiratory: Negative for cough and shortness of breath.   Cardiovascular: Negative for chest pain and palpitations.  Gastrointestinal: Negative for abdominal pain, diarrhea, constipation and blood in stool.  Genitourinary: Negative for dysuria and hematuria.  Musculoskeletal: Negative for arthralgias, back pain and myalgias.  Skin: Negative for rash.  Neurological: Negative for dizziness, syncope and headaches.  Hematological: Negative for adenopathy.  Psychiatric/Behavioral: Negative for confusion and dysphoric mood.       Objective:   Physical Exam  Constitutional: She is oriented to person, place, and time. She appears well-developed and well-nourished.  HENT:  Head: Normocephalic and atraumatic.  Eyes: EOM are normal. Pupils are equal, round, and reactive to light.  Neck: Normal range of motion. Neck supple. No thyromegaly present.  Cardiovascular: Normal rate, regular rhythm and normal heart sounds.   No murmur heard. Pulmonary/Chest: Breath sounds normal. No respiratory distress. She has no wheezes. She has no rales.  Abdominal: Soft. Bowel sounds are normal. She exhibits no distension and no mass. There is no tenderness. There is no rebound and no guarding.  Musculoskeletal: Normal range of motion. She exhibits no edema.  Lymphadenopathy:    She has no cervical adenopathy.  Neurological: She is alert and oriented to person, place, and time. She displays normal reflexes. No cranial nerve deficit.  Skin: No rash noted.  Psychiatric: She has a normal mood and affect. Her behavior is normal. Judgment and thought content normal.          Assessment & Plan:  Complete physical. Labs reviewed. Colonoscopy repeat by next year. Lipids poorly controlled. Get back on Crestor with refills given.  Type 2 diabetes -suboptimal control. Add Invokana 100 mg once daily. Reviewed possible side effects. Reassess A1c 3 months

## 2014-06-16 NOTE — Progress Notes (Signed)
Pre visit review using our clinic review tool, if applicable. No additional management support is needed unless otherwise documented below in the visit note. 

## 2014-06-19 ENCOUNTER — Other Ambulatory Visit: Payer: Self-pay | Admitting: Family Medicine

## 2014-06-23 ENCOUNTER — Encounter: Payer: Self-pay | Admitting: Family Medicine

## 2014-06-23 ENCOUNTER — Ambulatory Visit (INDEPENDENT_AMBULATORY_CARE_PROVIDER_SITE_OTHER): Payer: BC Managed Care – PPO | Admitting: Family Medicine

## 2014-06-23 ENCOUNTER — Other Ambulatory Visit (INDEPENDENT_AMBULATORY_CARE_PROVIDER_SITE_OTHER): Payer: BC Managed Care – PPO

## 2014-06-23 VITALS — BP 136/82 | HR 86 | Ht 63.0 in | Wt 181.0 lb

## 2014-06-23 DIAGNOSIS — G5602 Carpal tunnel syndrome, left upper limb: Secondary | ICD-10-CM | POA: Insufficient documentation

## 2014-06-23 DIAGNOSIS — M25539 Pain in unspecified wrist: Secondary | ICD-10-CM

## 2014-06-23 DIAGNOSIS — M7061 Trochanteric bursitis, right hip: Secondary | ICD-10-CM | POA: Insufficient documentation

## 2014-06-23 DIAGNOSIS — M25531 Pain in right wrist: Secondary | ICD-10-CM

## 2014-06-23 DIAGNOSIS — M25532 Pain in left wrist: Principal | ICD-10-CM

## 2014-06-23 DIAGNOSIS — M76899 Other specified enthesopathies of unspecified lower limb, excluding foot: Secondary | ICD-10-CM

## 2014-06-23 DIAGNOSIS — G5600 Carpal tunnel syndrome, unspecified upper limb: Secondary | ICD-10-CM

## 2014-06-23 NOTE — Assessment & Plan Note (Signed)
Patient does have mild carpal tunnel syndrome. I do not think that this is her overlying problem and likely the history of fibromyalgia is also causing discomfort and pain. We discussed icing regimen I think will be beneficial, bracing that she'll do 23 hours a day for the next 2 weeks then nightly for 2 weeks. We discussed home exercise and handout was given. Bracing was fitted by me today. Medications per orders. Patient will followup again in 3-4 weeks for further evaluation. Continuing to have pain we can consider injection.

## 2014-06-23 NOTE — Patient Instructions (Signed)
Very nice to meet you Ice 20 minutes 2 times daily. Usually after activity and before bed. Exercises 3 times a week. Alternate do upper body one day then do lower body one day.  Brace day and night for next 2 weeks then nightly for 2 weeks Take tylenol 650 mg three times a day is the best evidence based medicine we have for arthritis.  Glucosamine sulfate 716m twice a day is a supplement that has been shown to help moderate to severe arthritis. Vitamin D 2000 IU daily Fish oil 2 grams daily.  Tumeric 5063mtwice daily.  Capsaicin topically up to four times a day may also help with pain. Controlling your weight is important.  Consider physical therapy to strengthen muscles around the joint that hurts to take pressure off of the joint itself. Shoe inserts with good arch support may be helpful.  [employer] orthotics at [employer]  Or online. Total support is what you are looking for.   Water aerobics and cycling with low resistance are the best two types of exercise for arthritis. Come back and see me in 4 weeks.

## 2014-06-23 NOTE — Assessment & Plan Note (Signed)
Patient was given injection as described above. Patient tolerated the procedure very well. We discussed icing regimen and home exercise program to do a regular basis. We'll see discussed over-the-counter medications that can help her with her all process and her fibromyalgia. We may need to consider further treatment for the fibromyalgia specifically. Patient is to try these interventions and come back again in 3-4 weeks for further evaluation and treatment.

## 2014-06-23 NOTE — Progress Notes (Signed)
[name] [address] [place], [place] [address] Phone: [phone] Subjective:    I'm seeing this patient by the request  of:  Eulas Post, MD   CC: Left wrist pain and multiple other joint pain  [id number] MILAYA HORA is a 63 y.o. female coming in with complaint of left wrist pain. Patient does play golf and does have a past medical history significant for fibromyalgia. Patient states that whenever she plays she has some mild discomfort of her wrist. Patient has noticed now when she wakes up she has numbness in her thumb index and middle finger as well. Patient states when she starts moving it seems to get better. Patient to stays in one position for amount of time her and her seem to fall asleep as well. Denies any loss of strength though. Patient rates the severity of 6/10. Denies any true injury. Patient has not tried anything except wearing a brace sometimes at night which seems to be helpful. No other medications.  Patient also complains mostly of right hip pain. Patient states is on the lateral aspect. Does have a history of an L5 nerve root compression she states. Patient states that this is tender to palpation even on the side of the hip. Denies any radiation of the leg or any numbness or weakness. Patient states that so severe can wake her up at night and is the severity of 7/10.  Patient also complains of multiple other joints that seem to hurt and she think she does have arthritis.     Past medical history, social, surgical and family history all reviewed in electronic medical record.   Review of Systems: No headache, visual changes, nausea, vomiting, diarrhea, constipation, dizziness, abdominal pain, skin rash, fevers, chills, night sweats, weight loss, swollen lymph nodes, body aches, joint swelling, muscle aches, chest pain, shortness of breath, mood changes.   Objective Blood pressure 136/82, pulse 86, height 5' 3"  (1.6 m),  weight 181 lb (82.101 kg), SpO2 95.00%.  General: No apparent distress alert and oriented x3 mood and affect normal, dressed appropriately.  HEENT: Pupils equal, extraocular movements intact  Respiratory: Patient's speak in full sentences and does not appear short of breath  Cardiovascular: No lower extremity edema, non tender, no erythema  Skin: Warm dry intact with no signs of infection or rash on extremities or on axial skeleton.  Abdomen: Soft nontender  Neuro: Cranial nerves II through XII are intact, neurovascularly intact in all extremities with 2+ DTRs and 2+ pulses.  Lymph: No lymphadenopathy of posterior or anterior cervical chain or axillae bilaterally.  Gait normal with good balance and coordination.  MSK:  Non tender with full range of motion and good stability and symmetric strength and tone of shoulders, elbows,  hip, knee and ankles bilaterally.  Wrist: Left Inspection normal with no visible erythema or swelling. ROM smooth and normal with good flexion and extension and ulnar/radial deviation that is symmetrical with opposite wrist. Palpation is normal over metacarpals, navicular, lunate, and TFCC; tendons without tenderness/ swelling No snuffbox tenderness. No tenderness over Canal of [place]. Strength 5/5 in all directions without pain. Negative Finkelstein, positive tinel's and phalens. Negative Watson's test. Contralateral wrist unremarkable Hip: ROM IR: 35 Deg, ER: 45 Deg, Flexion: 120 Deg, Extension: 100 Deg, Abduction: 45 Deg, Adduction: 45 Deg Strength IR: 5/5, ER: 5/5, Flexion: 5/5, Extension: 5/5, Abduction: 5/5, Adduction: 5/5 Pelvic alignment unremarkable to inspection and palpation. Standing hip rotation and gait without trendelenburg sign /  unsteadiness. Greater trochanter with severe tenderness No tenderness over piriformis and greater trochanter. Positive Faber No SI joint tenderness and normal minimal SI movement.  MSK US performed of: Left wrist This  study was ordered, performed, and interpreted by Charlann Boxer D.O.  Wrist: All extensor compartments visualized and tendons all normal in appearance without fraying, tears, or sheath effusions. No effusion seen. TFCC intact. Scapholunate ligament intact. Carpal tunnel visualized and median nerve area n have mild nerves sheath effusion noted and mild increase in size at 1.69 cm compared to 1.32 cm in area compared to the contralateral side. Power doppler signal normal.  IMPRESSION: Mild carpal tunnel.  MSK US performed of: Right This study was ordered, performed, and interpreted by Charlann Boxer D.O.  Hip: Trochanteric bursa with significant hypoechoic changes and swelling Acetabular labrum visualized and without tears, displacement, or effusion in joint. Femoral neck appears unremarkable without increased power doppler signal along Cortex.  IMPRESSION:  Greater trochanter bursitis   Procedure: Real-time Ultrasound Guided Injection of right greater trochanteric bursitis secondary to patient's body habitus Device: GE Logiq E  Ultrasound guided injection is preferred based studies that show increased duration, increased effect, greater accuracy, decreased procedural pain, increased response rate, and decreased cost with ultrasound guided versus blind injection.  Verbal informed consent obtained.  Time-out conducted.  Noted no overlying erythema, induration, or other signs of local infection.  Skin prepped in a sterile fashion.  Local anesthesia: Topical Ethyl chloride.  With sterile technique and under real time ultrasound guidance:  Greater trochanteric area was visualized and patient's bursa was noted. A 22-gauge 3 inch needle was inserted and 4 cc of 0.5% Marcaine and 1 cc of Kenalog 40 mg/dL was injected. Pictures taken Completed without difficulty  Pain immediately resolved suggesting accurate placement of the medication.  Advised to call if fevers/chills, erythema, induration,  drainage, or persistent bleeding.  Images permanently stored and available for review in the ultrasound unit.  Impression: Technically successful ultrasound guided injection.     Impression and Recommendations:     This case required medical decision making of moderate complexity.

## 2014-07-04 ENCOUNTER — Telehealth: Payer: Self-pay | Admitting: Family Medicine

## 2014-07-04 MED ORDER — FLUCONAZOLE 150 MG PO TABS: 150.0000 mg | ORAL_TABLET | Freq: Once | ORAL | Status: DC

## 2014-07-04 NOTE — Telephone Encounter (Signed)
Patient Information:  Caller Name: Lashawn  Phone: [phone]  Patient: Dana Dyer  Gender: Female  DOB: 1950/12/10  Age: 63 Years  PCP: Colin Benton [facility])  Office Follow Up:  Does the office need to follow up with this patient?: Yes  Instructions For The Office: Medication request  RN Note:  Patient is calling c/o vaginal discharge and itching.  Requesting Diflucan to be called to [facility] [phone].  Desires a call back if this cannot be done.  Symptoms  Reason For Call & Symptoms: side effect of medication  Reviewed Health History In EMR: Yes  Reviewed Medications In EMR: Yes  Reviewed Allergies In EMR: Yes  Reviewed Surgeries / Procedures: Yes  Date of Onset of Symptoms: 07/04/2014  Guideline(s) Used:  Vaginal Discharge  Disposition Per Guideline:   See Within 3 Days in Office  Reason For Disposition Reached:   Diabetes mellitus or weak immune system (e.g., HIV positive, cancer chemo, splenectomy, organ transplant, chronic steroids)  Advice Given:  Call Back If:  You become worse.  Patient Refused Recommendation:  Patient Requests Prescription  Requesting Rx Diflucan

## 2014-07-04 NOTE — Telephone Encounter (Signed)
Will forward to PCP 

## 2014-07-04 NOTE — Telephone Encounter (Signed)
Pt notified Rx sent to pharmacy as requested.

## 2014-07-04 NOTE — Telephone Encounter (Signed)
Fluconazole 150 mg po  Times one dose

## 2014-07-21 ENCOUNTER — Ambulatory Visit: Payer: BC Managed Care – PPO | Admitting: Family Medicine

## 2014-08-13 ENCOUNTER — Other Ambulatory Visit: Payer: Self-pay | Admitting: Family Medicine

## 2014-08-20 ENCOUNTER — Other Ambulatory Visit: Payer: Self-pay | Admitting: Family Medicine

## 2014-08-28 ENCOUNTER — Other Ambulatory Visit: Payer: Self-pay | Admitting: Dermatology

## 2014-09-17 ENCOUNTER — Other Ambulatory Visit: Payer: BC Managed Care – PPO

## 2014-09-22 ENCOUNTER — Other Ambulatory Visit (INDEPENDENT_AMBULATORY_CARE_PROVIDER_SITE_OTHER): Payer: Medicare Other

## 2014-09-22 ENCOUNTER — Encounter: Payer: Self-pay | Admitting: Family Medicine

## 2014-09-22 DIAGNOSIS — E1165 Type 2 diabetes mellitus with hyperglycemia: Secondary | ICD-10-CM

## 2014-09-22 DIAGNOSIS — IMO0002 Reserved for concepts with insufficient information to code with codable children: Secondary | ICD-10-CM

## 2014-09-22 DIAGNOSIS — Z23 Encounter for immunization: Secondary | ICD-10-CM

## 2014-09-22 LAB — HEMOGLOBIN A1C: Hgb A1c MFr Bld: 7.6 % — ABNORMAL HIGH (ref 4.6–6.5)

## 2014-09-25 ENCOUNTER — Telehealth: Payer: Self-pay | Admitting: Family Medicine

## 2014-09-25 MED ORDER — INSULIN GLARGINE 100 UNIT/ML SOLOSTAR PEN: PEN_INJECTOR | SUBCUTANEOUS | Status: DC

## 2014-09-25 NOTE — Telephone Encounter (Signed)
Pt states that she has been trying to obtain a refill since Monday this week. [facility] says it has been faxed but patient thinks they are not sending it. Only has enough to get through tomorrow morning.

## 2014-09-25 NOTE — Telephone Encounter (Signed)
Rx sent to pharmacy   

## 2014-09-29 ENCOUNTER — Other Ambulatory Visit: Payer: Self-pay | Admitting: Family Medicine

## 2014-10-24 ENCOUNTER — Telehealth: Payer: Self-pay | Admitting: Family Medicine

## 2014-10-24 NOTE — Telephone Encounter (Signed)
Pt informed

## 2014-10-24 NOTE — Telephone Encounter (Signed)
Pt lost her glucometer and would like a free glucometer. Pt does not care  about brand.

## 2014-10-27 ENCOUNTER — Telehealth: Payer: Self-pay | Admitting: Family Medicine

## 2014-10-27 MED ORDER — ONETOUCH LANCETS MISC: Status: DC

## 2014-10-27 MED ORDER — GLUCOSE BLOOD VI STRP: ORAL_STRIP | Status: DC

## 2014-10-27 NOTE — Telephone Encounter (Signed)
Rx sent to pharmacy   

## 2014-10-27 NOTE — Telephone Encounter (Signed)
Pt is needs rxs for  test strips,lancet for one touch verio. [facility] [address]. Pt is testing three times a day.

## 2014-12-08 ENCOUNTER — Other Ambulatory Visit: Payer: Self-pay | Admitting: Family Medicine

## 2014-12-31 ENCOUNTER — Other Ambulatory Visit: Payer: Self-pay | Admitting: Family Medicine

## 2015-01-07 ENCOUNTER — Telehealth: Payer: Self-pay | Admitting: Family Medicine

## 2015-01-07 MED ORDER — FLUCONAZOLE 100 MG PO TABS: 100.0000 mg | ORAL_TABLET | ORAL | Status: DC

## 2015-01-07 NOTE — Telephone Encounter (Signed)
Pls advise.  

## 2015-01-07 NOTE — Telephone Encounter (Signed)
Rx sent to pharmacy. Pt is aware. Appt set.

## 2015-01-07 NOTE — Telephone Encounter (Signed)
[facility] - Client [facility] Patient Name: Dana Dyer DOB: [birth year] Initial Comment Caller states c/o yeast infection, thinks it may be related to Invokana that she takes for diabetes Nurse Assessment Nurse: Markus Daft, RN, Sherre Poot Date/Time (Eastern Time): 01/07/2015 11:24:24 AM Confirm and document reason for call. If symptomatic, describe symptoms. ---1) Caller states c/o yeast infection that she has had for last 8 months. She states that it is very itchy and raw in the area. Chronic yeast infection despite using Diflcan and other meds, and now requesting Diflucan again since it has been several months. Prone to yeast infections. She wears "oregano on her feet" from essential oils. She thinks it may be related to Invokana that she takes for diabetes. Blood sugars are doing well. --RN advised per WebMD, "Use of this medication may result in a new yeast infection in the vagina or penis. You are more likely to get a yeast infection from canagliflozin if you had yeast infections in the genital area before about 10-12 spots." 2) She also has spots on her shins - like a "liver spot", not hives. Slightly raised, pinkish red, and not itchy, and every day a few more, started 1 month ago, Does the patient require triage? ---Yes Related visit to physician within the last 2 weeks? ---No Does the PT have any chronic conditions? (i.e. diabetes, asthma, etc.) ---Yes List chronic conditions. ---NIDDM, HTN, chronic yeast infections Guidelines Guideline Title Affirmed Question Affirmed Notes Vaginal Symptoms MODERATE-SEVERE itching (i.e., interferes with school, work, or sleep) Final Disposition User See Physician within [place], Therapist, sports, [employer] states that they have been moving their 54+ year old mother in with them, and taking her to doctors, etc. She can f/u next wk. in office, but wonders if med could be  called in today?

## 2015-01-07 NOTE — Telephone Encounter (Signed)
Fill Fluconazole 100 mg and have her take one every other day for 7 doses and schedule follow up to discuss other possible options (other than the Invokana)

## 2015-01-12 ENCOUNTER — Ambulatory Visit (INDEPENDENT_AMBULATORY_CARE_PROVIDER_SITE_OTHER): Payer: Medicare Other | Admitting: Family Medicine

## 2015-01-12 ENCOUNTER — Encounter: Payer: Self-pay | Admitting: Family Medicine

## 2015-01-12 VITALS — BP 132/72 | HR 101 | Temp 97.9°F | Wt 180.0 lb

## 2015-01-12 DIAGNOSIS — E1165 Type 2 diabetes mellitus with hyperglycemia: Secondary | ICD-10-CM

## 2015-01-12 DIAGNOSIS — IMO0002 Reserved for concepts with insufficient information to code with codable children: Secondary | ICD-10-CM

## 2015-01-12 DIAGNOSIS — R233 Spontaneous ecchymoses: Secondary | ICD-10-CM

## 2015-01-12 LAB — CBC WITH DIFFERENTIAL/PLATELET
Basophils Absolute: 0 10*3/uL (ref 0.0–0.1)
Basophils Relative: 0.3 % (ref 0.0–3.0)
Eosinophils Absolute: 0.1 10*3/uL (ref 0.0–0.7)
Eosinophils Relative: 1.9 % (ref 0.0–5.0)
HCT: 39.1 % (ref 36.0–46.0)
Hemoglobin: 13.1 g/dL (ref 12.0–15.0)
LYMPHS ABS: 2.1 10*3/uL (ref 0.7–4.0)
LYMPHS PCT: 28.6 % (ref 12.0–46.0)
MCHC: 33.6 g/dL (ref 30.0–36.0)
MCV: 83.6 fl (ref 78.0–100.0)
MONOS PCT: 8.4 % (ref 3.0–12.0)
Monocytes Absolute: 0.6 10*3/uL (ref 0.1–1.0)
NEUTROS PCT: 60.8 % (ref 43.0–77.0)
Neutro Abs: 4.6 10*3/uL (ref 1.4–7.7)
Platelets: 240 10*3/uL (ref 150.0–400.0)
RBC: 4.68 Mil/uL (ref 3.87–5.11)
RDW: 14 % (ref 11.5–15.5)
WBC: 7.5 10*3/uL (ref 4.0–10.5)

## 2015-01-12 NOTE — Patient Instructions (Signed)
Resume short acting insulin Humalog with meals. Continue long-acting Lantus Check fasting blood glucose levels three times per day Recommend exercise 3 times per week for least 45 minutes/day  Basic Carbohydrate Counting for Diabetes Mellitus Carbohydrate counting is a method for keeping track of the amount of carbohydrates you eat. Eating carbohydrates naturally increases the level of sugar (glucose) in your blood, so it is important for you to know the amount that is okay for you to have in every meal. Carbohydrate counting helps keep the level of glucose in your blood within normal limits. The amount of carbohydrates allowed is different for every person. A dietitian can help you calculate the amount that is right for you. Once you know the amount of carbohydrates you can have, you can count the carbohydrates in the foods you want to eat. Carbohydrates are found in the following foods:  Grains, such as breads and cereals.  Dried beans and soy products.  Starchy vegetables, such as potatoes, peas, and corn.  Fruit and fruit juices.  Milk and yogurt.  Sweets and snack foods, such as cake, cookies, candy, chips, soft drinks, and fruit drinks. CARBOHYDRATE COUNTING There are two ways to count the carbohydrates in your food. You can use either of the methods or a combination of both. Reading the "Nutrition Facts" on [place] The "Nutrition Facts" is an area that is included on the labels of almost all packaged food and beverages in the [place]. It includes the serving size of that food or beverage and information about the nutrients in each serving of the food, including the grams (g) of carbohydrate per serving.  Decide the number of servings of this food or beverage that you will be able to eat or drink. Multiply that number of servings by the number of grams of carbohydrate that is listed on the label for that serving. The total will be the amount of carbohydrates you will be  having when you eat or drink this food or beverage. Learning Standard Serving Sizes of Food When you eat food that is not packaged or does not include "Nutrition Facts" on the label, you need to measure the servings in order to count the amount of carbohydrates.A serving of most carbohydrate-rich foods contains about 15 g of carbohydrates. The following list includes serving sizes of carbohydrate-rich foods that provide 15 g ofcarbohydrate per serving:   1 slice of bread (1 oz) or 1 six-inch tortilla.    of a hamburger bun or English muffin.  4-6 crackers.   cup unsweetened dry cereal.    cup hot cereal.   cup rice or pasta.    cup mashed potatoes or  of a large baked potato.  1 cup fresh fruit or one small piece of fruit.    cup canned or frozen fruit or fruit juice.  1 cup milk.   cup plain fat-free yogurt or yogurt sweetened with artificial sweeteners.   cup cooked dried beans or starchy vegetable, such as peas, corn, or potatoes.  Decide the number of standard-size servings that you will eat. Multiply that number of servings by 15 (the grams of carbohydrates in that serving). For example, if you eat 2 cups of strawberries, you will have eaten 2 servings and 30 g of carbohydrates (2 servings x 15 g = 30 g). For foods such as soups and casseroles, in which more than one food is mixed in, you will need to count the carbohydrates in each food that is included. EXAMPLE OF  CARBOHYDRATE COUNTING Sample Dinner  3 oz chicken breast.   cup of brown rice.   cup of corn.  1 cup milk.   1 cup strawberries with sugar-free whipped topping.  Carbohydrate Calculation Step 1: Identify the foods that contain carbohydrates:   Rice.   Corn.   Milk.   Strawberries. Step 2:Calculate the number of servings eaten of each:   2 servings of rice.   1 serving of corn.   1 serving of milk.   1 serving of strawberries. Step 3: Multiply each of those number  of servings by 15 g:   2 servings of rice x 15 g = 30 g.   1 serving of corn x 15 g = 15 g.   1 serving of milk x 15 g = 15 g.   1 serving of strawberries x 15 g = 15 g. Step 4: Add together all of the amounts to find the total grams of carbohydrates eaten: 30 g + 15 g + 15 g + 15 g = 75 g. Document Released: 11/21/2005 Document Revised: 04/07/2014 Document Reviewed: 10/18/2013 [facility] Patient Information 2015 [place], [place]. This information is not intended to replace advice given to you by your health care provider. Make sure you discuss any questions you have with your health care provider.

## 2015-01-12 NOTE — Progress Notes (Signed)
Subjective:    Patient ID: Dana Dyer, female    DOB: 09-Aug-1951, 64 y.o.   MRN: [id number]  HPI 64 y.o, female, seen today for follow-up on type II diabetes management. Patient stopped taking Invokana last Thursday due to experiencing unresolved continued yeast infection. Patient is currently managing diabetes with  Metformin and Lantus.  She has stopped administration of  Humalog while taking Invokana.  She was pleased with glycemic control and weight loss while on the Invokana but saw definite increase in the yeast infections after starting this.   Patient reports noticing round red "spots" on lower extremities bilaterally about one month ago.  She states spots just appeared on legs. Denies itching or burning and reports never seeing spots on her lower or upper extremities previously.  No easy bruising or bleeding issues.   Review of Systems  Constitutional: Negative.   HENT: Negative.   Eyes: Negative.   Respiratory: Negative.   Cardiovascular: Negative.   Gastrointestinal: Negative.   Endocrine: Negative.   Genitourinary: Negative for vaginal discharge.  Musculoskeletal: Negative.   Skin: Positive for color change and rash.       Pt reports red spot, lower extremities,  appeared one month ago.   Allergic/Immunologic: Negative.   Neurological: Negative.   Hematological: Negative.   Psychiatric/Behavioral: Negative.    Past Medical History  Diagnosis Date  . DIABETES MELLITUS, UNCONTROLLED 03/16/2009  . HYPERLIPIDEMIA 10/09/2007  . OBSTRUCTIVE SLEEP APNEA 10/09/2007  . HYPERTENSION 10/09/2007  . ALLERGIC RHINITIS 10/09/2007  . FIBROMYALGIA 10/09/2007  . FATIGUE 10/21/2008  . TRANSAMINASES, SERUM, ELEVATED 07/08/2009   Past Surgical History  Procedure Laterality Date  . Tonsillectomy  1959  . Mamoplasty      reduction  . Basal cell carcinoma excision    . Abdominal hysterectomy  1979    prolapsed uterus    reports that she has never smoked. She does not have any  smokeless tobacco history on file. Her alcohol and drug histories are not on file. family history includes Alcohol abuse in her father; Arthritis in her father and mother; Cancer in her mother; Diabetes in her mother; Hypertension in her maternal grandfather; Miscarriages / [place] in her paternal grandfather. Allergies  Allergen Reactions  . Amoxicillin-Pot Clavulanate     Severe diarrhea  . Codeine Sulfate     REACTION: "hyper"  . Erythromycin Base   . Irbesartan-Hydrochlorothiazide   . Latex       Objective:   Physical Exam  Constitutional: She is oriented to person, place, and time. She appears well-developed and well-nourished.  HENT:  Head: Normocephalic.  Eyes: Conjunctivae and EOM are normal. Pupils are equal, round, and reactive to light.  Neck: Normal range of motion. Neck supple.  Cardiovascular: Normal rate, regular rhythm, normal heart sounds and intact distal pulses.   Pulmonary/Chest: Effort normal and breath sounds normal.  Musculoskeletal: Normal range of motion.  Neurological: She is alert and oriented to person, place, and time. She has normal reflexes.  Skin: Skin is warm. There is erythema.  Bilateral lower extremities patches of petechiae present. Petechiae is confined to lower extremities only. Absent of bruising and pt denies episodes of bleeding.  Psychiatric: She has a normal mood and affect. Her behavior is normal. Judgment and thought content normal.          Assessment & Plan:  Dana Dyer was seen today for follow-up.  Diagnoses and associated orders for this visit:  Diabetes type 2, uncontrolled  Petechiae - CBC with  Differential/Platelet   suspect petechiae related to pressure from standing on legs frequently.  She does  Not have any worrisome bruising, generalized petechiae, or other bleeding issues.  Discountinue Invokana.   Continue with Metformin, Lantus, and incorporate Humalog before meals for glycemic control as previously  prescribed. We discussed GLP-1 class but she had nausea with Victoza and is reluctant to try others in the class.  Patient counseled regarding self checking glucose levels 3x per day before meals.  Encouraged to exercise 3x/wk at least 45 minutes/day  Scheduled 3 month follow-up-repeat A1C then.

## 2015-01-12 NOTE — Progress Notes (Signed)
Pre visit review using our clinic review tool, if applicable. No additional management support is needed unless otherwise documented below in the visit note. 

## 2015-01-18 ENCOUNTER — Other Ambulatory Visit: Payer: Self-pay | Admitting: Family Medicine

## 2015-01-19 NOTE — Telephone Encounter (Signed)
Refill once 

## 2015-01-26 ENCOUNTER — Telehealth: Payer: Self-pay | Admitting: Family Medicine

## 2015-01-26 NOTE — Telephone Encounter (Signed)
Meter given to [employer]

## 2015-01-26 NOTE — Telephone Encounter (Signed)
Pt needs to come pick up a new diabetes kit since she left hers here in the office.

## 2015-01-27 ENCOUNTER — Telehealth: Payer: Self-pay | Admitting: *Deleted

## 2015-01-27 NOTE — Telephone Encounter (Signed)
Pt called at 10:30 this morning stating that she mistakenly took her Humalog 44 u instead of what she thought was 56 units of Lantus.  She is prescribed to take 56 units of Lantus every morning.  She took 10 units of the Lantus (that was how much was left in the pen) and grabbed a new pen of what she thought was Lantus and took 44 units.  She quickly realized it was humalog and not Lantus.  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  Asked pt to check her bs and was 206 but she stated that she had just taken the humalog 5 mins ago.  Spoke to Dr Elease Hashimoto and per Dr Elease Hashimoto she should be okay.  He would like pt to eat and drink and monitor bs over the next 6-8 hours.  She should have her husband to stay with her and monitor her.  Pt is aware.    At 1623 I called pt back to check on her and she stated that she felt fine.  Her last BS reading was 137 roughly 15 minutes ago.  She stated that she has checked her bs 3 other times today and they were all around the same reading.  Told pt to keep a check on it for a couple more hours and call back if she needed.  She verbalized understanding and had no questions.  Nothing further needed at this time.

## 2015-02-09 ENCOUNTER — Other Ambulatory Visit: Payer: Self-pay

## 2015-02-09 DIAGNOSIS — Z1231 Encounter for screening mammogram for malignant neoplasm of breast: Secondary | ICD-10-CM

## 2015-02-16 ENCOUNTER — Other Ambulatory Visit: Payer: Self-pay

## 2015-02-16 MED ORDER — INSULIN PEN NEEDLE 29G X 12.7MM MISC: Status: DC

## 2015-03-16 ENCOUNTER — Other Ambulatory Visit: Payer: Self-pay | Admitting: Family Medicine

## 2015-03-19 ENCOUNTER — Ambulatory Visit: Payer: Medicare Other

## 2015-04-08 ENCOUNTER — Other Ambulatory Visit: Payer: Medicare Other

## 2015-04-10 ENCOUNTER — Ambulatory Visit: Payer: Medicare Other | Admitting: Family Medicine

## 2015-04-24 ENCOUNTER — Ambulatory Visit: Payer: Medicare Other

## 2015-05-06 ENCOUNTER — Ambulatory Visit
Admission: RE | Admit: 2015-05-06 | Discharge: 2015-05-06 | Disposition: A | Discharge status: Home / Self Care | Payer: Medicare Other | Source: Ambulatory Visit

## 2015-05-06 DIAGNOSIS — Z1231 Encounter for screening mammogram for malignant neoplasm of breast: Secondary | ICD-10-CM

## 2015-05-11 ENCOUNTER — Other Ambulatory Visit (INDEPENDENT_AMBULATORY_CARE_PROVIDER_SITE_OTHER): Payer: Medicare Other

## 2015-05-11 ENCOUNTER — Other Ambulatory Visit: Payer: Self-pay | Admitting: *Deleted

## 2015-05-11 DIAGNOSIS — E119 Type 2 diabetes mellitus without complications: Secondary | ICD-10-CM | POA: Diagnosis not present

## 2015-05-11 LAB — HEMOGLOBIN A1C: Hgb A1c MFr Bld: 8.7 % — ABNORMAL HIGH (ref 4.6–6.5)

## 2015-05-11 MED ORDER — INSULIN GLARGINE 100 UNIT/ML SOLOSTAR PEN: PEN_INJECTOR | SUBCUTANEOUS | Status: DC

## 2015-05-18 ENCOUNTER — Ambulatory Visit (INDEPENDENT_AMBULATORY_CARE_PROVIDER_SITE_OTHER): Payer: Medicare Other | Admitting: Family Medicine

## 2015-05-18 ENCOUNTER — Encounter: Payer: Self-pay | Admitting: Family Medicine

## 2015-05-18 VITALS — BP 132/80 | HR 92 | Temp 98.3°F | Ht 63.0 in | Wt 178.5 lb

## 2015-05-18 DIAGNOSIS — E1165 Type 2 diabetes mellitus with hyperglycemia: Secondary | ICD-10-CM | POA: Diagnosis not present

## 2015-05-18 DIAGNOSIS — IMO0002 Reserved for concepts with insufficient information to code with codable children: Secondary | ICD-10-CM

## 2015-05-18 MED ORDER — INSULIN LISPRO 100 UNIT/ML (KWIKPEN): PEN_INJECTOR | SUBCUTANEOUS | Status: DC

## 2015-05-18 NOTE — Progress Notes (Signed)
   Subjective:    Patient ID: Dana Dyer, female    DOB: 1951/03/02, 64 y.o.   MRN: [id number]  HPI Follow-up type 2 diabetes. History of poor control. Very stressful 6 months. Her son was diagnosed with renal cell cancer in her mother just died of Alzheimer's, locations few weeks ago. She has been much less involved with exercise. Her weight has been stable. Fasting blood sugars remained stable but recent A1c 8.7%. She is on metformin and takes Lantus and Humalog with meals. No recent hypoglycemia. Not monitoring pre-or postprandial sugars other than fasting glucose   Past Medical History  Diagnosis Date  . DIABETES MELLITUS, UNCONTROLLED 03/16/2009  . HYPERLIPIDEMIA 10/09/2007  . OBSTRUCTIVE SLEEP APNEA 10/09/2007  . HYPERTENSION 10/09/2007  . ALLERGIC RHINITIS 10/09/2007  . FIBROMYALGIA 10/09/2007  . FATIGUE 10/21/2008  . TRANSAMINASES, SERUM, ELEVATED 07/08/2009   Past Surgical History  Procedure Laterality Date  . Tonsillectomy  1959  . Mamoplasty      reduction  . Basal cell carcinoma excision    . Abdominal hysterectomy  1979    prolapsed uterus    reports that she has never smoked. She does not have any smokeless tobacco history on file. Her alcohol and drug histories are not on file. family history includes Alcohol abuse in her father; Arthritis in her father and mother; Cancer in her mother; Diabetes in her mother; Hypertension in her maternal grandfather; Miscarriages / [place] in her paternal grandfather. Allergies  Allergen Reactions  . Amoxicillin-Pot Clavulanate     Severe diarrhea  . Codeine Sulfate     REACTION: "hyper"  . Erythromycin Base   . Invokana [Canagliflozin] Other (See Comments)    Recurrent yeast infections  . Irbesartan-Hydrochlorothiazide   . Latex       Review of Systems  Constitutional: Negative for fatigue.  Eyes: Negative for visual disturbance.  Respiratory: Negative for cough, chest tightness, shortness of breath and wheezing.     Cardiovascular: Negative for chest pain, palpitations and leg swelling.  Endocrine: Negative for polydipsia and polyuria.  Neurological: Negative for dizziness, seizures, syncope, weakness, light-headedness and headaches.       Objective:   Physical Exam  Constitutional: She appears well-developed and well-nourished.  Cardiovascular: Normal rate and regular rhythm.   Pulmonary/Chest: Effort normal and breath sounds normal. No respiratory distress. She has no wheezes. She has no rales.  Skin:  Feet reveal no skin lesions. Good distal foot pulses. Good capillary refill. No calluses. Normal sensation with monofilament testing           Assessment & Plan:  Type 2 diabetes. Suboptimal control. Tighten up diet and exercise. She's had previous nausea with GLP-1 class and had recurrent yeast vaginitis with S GLT 2 medication. We discussed possible use of longer acting GLP-1 but at this point she would like to try diet and exercise first and reassess in 3 months

## 2015-05-18 NOTE — Patient Instructions (Signed)
Get back to regular exercise habits and try to lose some weight.

## 2015-05-18 NOTE — Addendum Note (Signed)
Addended by: Westley Hummer B on: 05/18/2015 01:52 PM   Modules accepted: Orders

## 2015-05-18 NOTE — Progress Notes (Signed)
Pre visit review using our clinic review tool, if applicable. No additional management support is needed unless otherwise documented below in the visit note. 

## 2015-05-29 ENCOUNTER — Ambulatory Visit (INDEPENDENT_AMBULATORY_CARE_PROVIDER_SITE_OTHER): Payer: Medicare Other | Admitting: Podiatry

## 2015-05-29 ENCOUNTER — Encounter: Payer: Self-pay | Admitting: Podiatry

## 2015-05-29 VITALS — BP 137/84 | HR 86 | Temp 99.1°F | Resp 14

## 2015-05-29 DIAGNOSIS — M2010 Hallux valgus (acquired), unspecified foot: Secondary | ICD-10-CM | POA: Diagnosis not present

## 2015-05-29 DIAGNOSIS — L84 Corns and callosities: Secondary | ICD-10-CM

## 2015-05-29 DIAGNOSIS — M2040 Other hammer toe(s) (acquired), unspecified foot: Secondary | ICD-10-CM

## 2015-05-29 DIAGNOSIS — E119 Type 2 diabetes mellitus without complications: Secondary | ICD-10-CM | POA: Diagnosis not present

## 2015-05-29 NOTE — Patient Instructions (Signed)
Diabetes and Foot Care Diabetes may cause you to have problems because of poor blood supply (circulation) to your feet and legs. This may cause the skin on your feet to become thinner, break easier, and heal more slowly. Your skin may become dry, and the skin may peel and crack. You may also have nerve damage in your legs and feet causing decreased feeling in them. You may not notice minor injuries to your feet that could lead to infections or more serious problems. Taking care of your feet is one of the most important things you can do for yourself.  HOME CARE INSTRUCTIONS  Wear shoes at all times, even in the house. Do not go barefoot. Bare feet are easily injured.  Check your feet daily for blisters, cuts, and redness. If you cannot see the bottom of your feet, use a mirror or ask someone for help.  Wash your feet with warm water (do not use hot water) and mild soap. Then pat your feet and the areas between your toes until they are completely dry. Do not soak your feet as this can dry your skin.  Apply a moisturizing lotion or petroleum jelly (that does not contain alcohol and is unscented) to the skin on your feet and to dry, brittle toenails. Do not apply lotion between your toes.  Trim your toenails straight across. Do not dig under them or around the cuticle. File the edges of your nails with an emery board or nail file.  Do not cut corns or calluses or try to remove them with medicine.  Wear clean socks or stockings every day. Make sure they are not too tight. Do not wear knee-high stockings since they may decrease blood flow to your legs.  Wear shoes that fit properly and have enough cushioning. To break in new shoes, wear them for just a few hours a day. This prevents you from injuring your feet. Always look in your shoes before you put them on to be sure there are no objects inside.  Do not cross your legs. This may decrease the blood flow to your feet.  If you find a minor scrape,  cut, or break in the skin on your feet, keep it and the skin around it clean and dry. These areas may be cleansed with mild soap and water. Do not cleanse the area with peroxide, alcohol, or iodine.  When you remove an adhesive bandage, be sure not to damage the skin around it.  If you have a wound, look at it several times a day to make sure it is healing.  Do not use heating pads or hot water bottles. They may burn your skin. If you have lost feeling in your feet or legs, you may not know it is happening until it is too late.  Make sure your health care provider performs a complete foot exam at least annually or more often if you have foot problems. Report any cuts, sores, or bruises to your health care provider immediately. SEEK MEDICAL CARE IF:   You have an injury that is not healing.  You have cuts or breaks in the skin.  You have an ingrown nail.  You notice redness on your legs or feet.  You feel burning or tingling in your legs or feet.  You have pain or cramps in your legs and feet.  Your legs or feet are numb.  Your feet always feel cold. SEEK IMMEDIATE MEDICAL CARE IF:   There is increasing redness,   swelling, or pain in or around a wound.  There is a red line that goes up your leg.  Pus is coming from a wound.  You develop a fever or as directed by your health care provider.  You notice a bad smell coming from an ulcer or wound. Document Released: 11/18/2000 Document Revised: 07/24/2013 Document Reviewed: 04/30/2013 Exit[employer] Patient Information 2015 [employer]. This information is not intended to replace advice given to you by your health care provider. Make sure you discuss any questions you have with your health care provider.  

## 2015-05-29 NOTE — Progress Notes (Signed)
   Subjective:    Patient ID: Dana Dyer, female    DOB: [birth year], 64 y.o.   MRN: [id number]  HPI 63 year old female presents to the office today for diabetic risk assessment and for painful calluses to the sides of her big toes, which have been present for several years. She was diagnosed with diabetes partially 7-8 years ago. She states that normally her blood sugars controlled however over the last couple months her sugar has been high due to personal issues. Her last HbA1c was 8.7. She denies any history of ulceration, claudication symptoms or any tingling or numbness. She denies any swelling. No other complaints at this time.  Review of Systems  Gastrointestinal: Positive for diarrhea.  Musculoskeletal:       Joint pain , muscle pain       Objective:   Physical Exam  AAO x3, NAD DP/PT pulses palpable bilaterally, CRT less than 3 seconds Protective sensation intact with Simms Weinstein monofilament, vibratory sensation intact, Achilles tendon reflex intact Hyperkeratotic lesions present on the medial aspect of the right first metatarsal head and bilateral medial hallux IPJ. There is also corn on the medial aspect of the lateral fourth digit on the right side. Upon debridement lesion no underlying ulceration or clinical signs of infection. There is HAV deformity present hammertoe contractures present. No areas of tenderness to bilateral lower extremities. MMT 5/5, ROM WNL.  No open lesions or pre-ulcerative lesions.  No overlying edema, erythema, increase in warmth to bilateral lower extremities.  No pain with calf compression, swelling, warmth, erythema bilaterally.      Assessment & Plan:  64 year old type II diabetic female with symptomatic hyperkeratotic lesions due to underlying foot deformity -Treatment options discussed including all alternatives, risks, and complications -Lesions are sharply debrided without complication/bleeding 4 -Discussed importance of daily foot  inspection. If any changes are to occur to call the office immediately. -Follow-up in 6 months. In the meantime call the office with any questions, concerns, change in symptoms.

## 2015-05-30 NOTE — Addendum Note (Signed)
Addended by: Celesta Gentile R on: 05/30/2015 09:16 AM   Modules accepted: Level of Service

## 2015-06-19 ENCOUNTER — Encounter: Payer: Self-pay | Admitting: Pulmonary Disease

## 2015-06-19 ENCOUNTER — Ambulatory Visit (INDEPENDENT_AMBULATORY_CARE_PROVIDER_SITE_OTHER): Payer: Medicare Other | Admitting: Pulmonary Disease

## 2015-06-19 VITALS — BP 124/80 | HR 90 | Ht 63.0 in | Wt 179.0 lb

## 2015-06-19 DIAGNOSIS — G4733 Obstructive sleep apnea (adult) (pediatric): Secondary | ICD-10-CM | POA: Diagnosis not present

## 2015-06-19 DIAGNOSIS — J4520 Mild intermittent asthma, uncomplicated: Secondary | ICD-10-CM | POA: Diagnosis not present

## 2015-06-19 NOTE — Patient Instructions (Signed)
Home sleep study to reassess Meantime, get back on CPAP CPAP supplies -nasal pillows - will be provided  & download reviewed x 1 month Once sleep stuy reviewed, will try to get you a new machine

## 2015-06-19 NOTE — Progress Notes (Signed)
   Subjective:    Patient ID: Dana Dyer, female    DOB: 18-Sep-1951, 64 y.o.   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 PSG 04/2005 showed mild OSA with AHI 12/hr, severe in REM with AHI 32/h --> corrected with CPAP +10 cm , pillows with chin strap ( confirmed by download on auto )   06/19/2015  Chief Complaint  Patient presents with  . Sleep Apnea    Patient has been off the CPAP machine for 4 years.  Wants to discuss getting back on machine.  Epworth Score: 16    Wt unchanged since 2011-she has lost 20 pounds since her study in 2006 and has managed to keep it off. She stopped using her CPAP after her last visit in 2011 and her symptoms have now returned, she feels very sleepy in the daytime, especially as a passenger in a car. She has not had problems driving Epworth sleepiness score is 16, snoring has been noted by family members she complains of dry mouth and waking up, choking and gasping episodes of woken her up from sleep There is no history suggestive of cataplexy, sleep paralysis or parasomnias Her diabetes has been uncontrolled lately-she is on 3 medications for hypertension She inquires about oral appliance and would like to get a new CPAP machine if needed  Past Medical History  Diagnosis Date  . DIABETES MELLITUS, UNCONTROLLED 03/16/2009  . HYPERLIPIDEMIA 10/09/2007  . OBSTRUCTIVE SLEEP APNEA 10/09/2007  . HYPERTENSION 10/09/2007  . ALLERGIC RHINITIS 10/09/2007  . FIBROMYALGIA 10/09/2007  . FATIGUE 10/21/2008  . TRANSAMINASES, SERUM, ELEVATED 07/08/2009  . Morton's neuroma   . Plantar fasciitis      Review of Systems neg for any significant sore throat, dysphagia, itching, sneezing, nasal congestion or excess/ purulent secretions, fever, chills, sweats, unintended wt loss, pleuritic or exertional cp, hempoptysis, orthopnea pnd or change in chronic leg swelling. Also denies presyncope, palpitations, heartburn, abdominal  pain, nausea, vomiting, diarrhea or change in bowel or urinary habits, dysuria,hematuria, rash, arthralgias, visual complaints, headache, numbness weakness or ataxia.     Objective:   Physical Exam  Gen. Pleasant, obese, in no distress ENT - no lesions, no post nasal drip Neck: No JVD, no thyromegaly, no carotid bruits Lungs: no use of accessory muscles, no dullness to percussion, decreased without rales or rhonchi  Cardiovascular: Rhythm regular, heart sounds  normal, no murmurs or gallops, no peripheral edema Musculoskeletal: No deformities, no cyanosis or clubbing , no tremors       Assessment & Plan:

## 2015-06-19 NOTE — Assessment & Plan Note (Addendum)
It does appear that she is increasingly symptomatic after staining of the CPAP. Although her weight is unchanged. We discussed treatment options including oral appliance-I do think it would be best for her to get back on CPAP  Home sleep study to reassess Meantime, get back on CPAP 10 cm CPAP supplies -nasal pillows - will be provided  & download reviewed x 1 month Once sleep stuy reviewed, will try to get you a new machine

## 2015-06-20 NOTE — Assessment & Plan Note (Signed)
Appears stable without exacerbations

## 2015-07-01 ENCOUNTER — Other Ambulatory Visit: Payer: Self-pay | Admitting: Family Medicine

## 2015-07-14 ENCOUNTER — Encounter: Payer: Self-pay | Admitting: Family Medicine

## 2015-07-14 ENCOUNTER — Ambulatory Visit (INDEPENDENT_AMBULATORY_CARE_PROVIDER_SITE_OTHER): Payer: Medicare Other | Admitting: Family Medicine

## 2015-07-14 ENCOUNTER — Other Ambulatory Visit (INDEPENDENT_AMBULATORY_CARE_PROVIDER_SITE_OTHER): Payer: Medicare Other

## 2015-07-14 VITALS — BP 130/78 | HR 94 | Ht 63.0 in | Wt 180.0 lb

## 2015-07-14 DIAGNOSIS — M25512 Pain in left shoulder: Secondary | ICD-10-CM

## 2015-07-14 DIAGNOSIS — M7522 Bicipital tendinitis, left shoulder: Secondary | ICD-10-CM | POA: Diagnosis not present

## 2015-07-14 DIAGNOSIS — R251 Tremor, unspecified: Secondary | ICD-10-CM | POA: Diagnosis not present

## 2015-07-14 NOTE — Progress Notes (Signed)
Pre visit review using our clinic review tool, if applicable. No additional management support is needed unless otherwise documented below in the visit note. 

## 2015-07-14 NOTE — Assessment & Plan Note (Signed)
Patient's was recommended to get a compression sleeve, we'll try topical anti-inflammatory's icing protocol. Patient states that she has not been doing the exercises regularly. Patient work with Product/process development scientist today that I think will be beneficial. We discussed which activities to potentially avoid. Patient will come back and see me again in 3-4 weeks for further evaluation and treatment.

## 2015-07-14 NOTE — Progress Notes (Signed)
[facility] [address] [place], [place] [address] Phone: [phone] Subjective:    CC: Left shoulder pain.   OPF:[id number] IVALEE Dyer is a 64 y.o. female coming in with complaint of left shoulder pain. She does have past medical history significant for carpal tunnel syndrome on the left wrist as well as fibromyalgia. Patient states this been going on for multiple months. Seems to be actually improving with her taking some time off over the course last 2 weeks. Patient has had some difficulty glide changes recently and has been moving boxes out of her deceased mother's house. Patient was having significant pain at that time but now that she has done it seems to be improving. Seemed to start on the anterior chest wall and radiate down her arm. Patient states though that it was not only with activity but also with rest. Patient denies any shortness of breath. States it is more of a dull aching pain that seems to shoot through the shoulder. Patient states that it only hurts more with range of motion. Denies any nighttime awakening or any weakness. Patient describes it as a sharp pain when it did occur. Did not respond to anti-inflammatory's  Patient states she has been diagnosed with a tremor previously and has noticed that is gotten worse. Is wondering what that could be and if it is secondary to her shoulder.     Past Medical History  Diagnosis Date  . DIABETES MELLITUS, UNCONTROLLED 03/16/2009  . HYPERLIPIDEMIA 10/09/2007  . OBSTRUCTIVE SLEEP APNEA 10/09/2007  . HYPERTENSION 10/09/2007  . ALLERGIC RHINITIS 10/09/2007  . FIBROMYALGIA 10/09/2007  . FATIGUE 10/21/2008  . TRANSAMINASES, SERUM, ELEVATED 07/08/2009  . Morton's neuroma   . Plantar fasciitis    Past Surgical History  Procedure Laterality Date  . Tonsillectomy  1959  . Mamoplasty      reduction  . Basal cell carcinoma excision    . Abdominal hysterectomy  1979    prolapsed uterus   History    Substance Use Topics  . Smoking status: Never Smoker   . Smokeless tobacco: Never Used  . Alcohol Use: Not on file   Allergies  Allergen Reactions  . Amoxicillin-Pot Clavulanate     Severe diarrhea  . Codeine Sulfate     REACTION: "hyper"  . Erythromycin Base   . Invokana [Canagliflozin] Other (See Comments)    Recurrent yeast infections  . Irbesartan-Hydrochlorothiazide   . Latex    Family History  Problem Relation Age of Onset  . Diabetes Mother   . Arthritis Mother   . Cancer Mother     breast  . Alcohol abuse Father   . Arthritis Father   . Hypertension Maternal Grandfather   . Miscarriages / Stillbirths Paternal Grandfather    father was diagnosed with Alzheimer's and Parkinson's   Past medical history, social, surgical and family history all reviewed in electronic medical record.   Review of Systems: No headache, visual changes, nausea, vomiting, diarrhea, constipation, dizziness, abdominal pain, skin rash, fevers, chills, night sweats, weight loss, swollen lymph nodes, body aches, joint swelling, muscle aches, chest pain, shortness of breath, mood changes.   Objective Blood pressure 130/78, pulse 94, height 5' 3"  (1.6 m), weight 180 lb (81.647 kg), SpO2 95 %.  General: No apparent distress alert and oriented x3 mood and affect normal, dressed appropriately.  HEENT: Pupils equal, extraocular movements intact  Respiratory: Patient's speak in full sentences and does not appear short of breath  Cardiovascular: No lower extremity edema, non tender, no erythema  Skin: Warm dry intact with no signs of infection or rash on extremities or on axial skeleton.  Abdomen: Soft nontender  Neuro: Cranial nerves II through XII are intact, neurovascularly intact in all extremities with 2+ DTRs and 2+ pulses.  Lymph: No lymphadenopathy of posterior or anterior cervical chain or axillae bilaterally.  Gait normal with good balance and coordination.  MSK:  Non tender with full range  of motion and good stability and symmetric strength and tone of  elbows, wrist, hip, knee and ankles bilaterally.  Shoulder: left Inspection reveals no abnormalities, atrophy or asymmetry. Palpation is normal with no tenderness over AC joint or bicipital groove. ROM is full in all planes passively. Patient though does have cogwheeling noted as well as a very mild resting tremor Rotator cuff strength normal throughout. signs of impingement with positive Neer and Hawkin's tests, but negative empty can sign. Speeds and Yergason's tests positive No labral pathology noted with negative Obrien's, negative clunk and good stability. Normal scapular function observed. No painful arc and no drop arm sign. No apprehension sign  MSK US performed of: left This study was ordered, performed, and interpreted by Charlann Boxer D.O.  Shoulder:   Supraspinatus:  Mild atrophy and degenerative changes but no true tear noted Infraspinatus:  Appears normal on long and transverse views. Significant increase in Doppler flow Subscapularis:  Appears normal on long and transverse views. Positive bursa Teres Minor:  Appears normal on long and transverse views. AC joint:  Moderate arthritis Glenohumeral Joint:  Mild to moderate arthritis. Glenoid Labrum:  Intact without visualized tears. Biceps Tendon: Significant bicep tendinitis  Impression: Mild to moderate degenerative changes of the shoulder but bicep tendinitis  Procedure note 97110; 15 minutes spent for Therapeutic exercises as stated in above notes.  This included exercises focusing on stretching, strengthening, with significant focus on eccentric aspects. Shoulder Exercises that included:  Basic scapular stabilization to include adduction and depression of scapula Scaption, focusing on proper movement and good control Internal and External rotation utilizing a theraband, with elbow tucked at side entire time Rows with theraband  Proper technique shown and  discussed handout in great detail with ATC.  All questions were discussed and answered.     Impression and Recommendations:     This case required medical decision making of moderate complexity.

## 2015-07-14 NOTE — Assessment & Plan Note (Signed)
Patient will be referred to neurology. Patient does have a mild tremor on on exam today patient did have some mild cogwheeling of the left upper extremity but no other signs and symptoms. Patient does have a family history of parkinsonism and her father.

## 2015-07-14 NOTE — Patient Instructions (Addendum)
Good to see you again.  Turmeric 533m daily Vitamin D 2000 IU daily Exercises 3 times a week.  Ice 20 minutes 2 times daily. Usually after activity and before bed. Compression sleeve with activity from [employer] or [employer] I am sorry for everything that has happen this year.  Dr. TCarles Colletoffice will be calling you See me again in 3 weeks.

## 2015-07-22 DIAGNOSIS — G4733 Obstructive sleep apnea (adult) (pediatric): Secondary | ICD-10-CM | POA: Diagnosis not present

## 2015-07-29 ENCOUNTER — Telehealth: Payer: Self-pay | Admitting: Pulmonary Disease

## 2015-07-29 DIAGNOSIS — G4733 Obstructive sleep apnea (adult) (pediatric): Secondary | ICD-10-CM

## 2015-07-29 NOTE — Telephone Encounter (Signed)
Last OV: 06/19/15 Showed mild OSA 13/hr Get back on CPAP machine Ok to order new machine if needed

## 2015-07-30 ENCOUNTER — Encounter: Payer: Self-pay | Admitting: Pulmonary Disease

## 2015-07-30 NOTE — Telephone Encounter (Signed)
lmtcb

## 2015-07-30 NOTE — Telephone Encounter (Signed)
lmtcb for pt.  

## 2015-07-30 NOTE — Telephone Encounter (Signed)
Patient returned call, you may leave a message.

## 2015-07-31 NOTE — Telephone Encounter (Signed)
Spoke with patient, aware of results. Requests that we send an order to [facility] for new machine, mask and supplies. Pt is out of town until 08/03/15 - requests that we wait to contact her until next week. Will send to [facility] to make sure this is addressed.

## 2015-08-04 ENCOUNTER — Other Ambulatory Visit: Payer: Self-pay | Admitting: *Deleted

## 2015-08-04 DIAGNOSIS — G4733 Obstructive sleep apnea (adult) (pediatric): Secondary | ICD-10-CM

## 2015-08-05 ENCOUNTER — Ambulatory Visit: Payer: Medicare Other | Admitting: Family Medicine

## 2015-08-06 ENCOUNTER — Other Ambulatory Visit: Payer: Self-pay | Admitting: Family Medicine

## 2015-08-26 ENCOUNTER — Other Ambulatory Visit (INDEPENDENT_AMBULATORY_CARE_PROVIDER_SITE_OTHER): Payer: Medicare Other

## 2015-08-26 DIAGNOSIS — E1165 Type 2 diabetes mellitus with hyperglycemia: Secondary | ICD-10-CM

## 2015-08-26 DIAGNOSIS — IMO0002 Reserved for concepts with insufficient information to code with codable children: Secondary | ICD-10-CM

## 2015-08-26 LAB — HEMOGLOBIN A1C: HEMOGLOBIN A1C: 8.7 % — AB (ref 4.6–6.5)

## 2015-09-01 ENCOUNTER — Encounter: Payer: Self-pay | Admitting: Family Medicine

## 2015-09-01 ENCOUNTER — Ambulatory Visit (INDEPENDENT_AMBULATORY_CARE_PROVIDER_SITE_OTHER): Payer: Medicare Other | Admitting: Family Medicine

## 2015-09-01 VITALS — BP 122/82 | HR 78 | Temp 98.6°F | Ht 62.21 in | Wt 180.1 lb

## 2015-09-01 DIAGNOSIS — Z0000 Encounter for general adult medical examination without abnormal findings: Secondary | ICD-10-CM

## 2015-09-01 DIAGNOSIS — E11359 Type 2 diabetes mellitus with proliferative diabetic retinopathy without macular edema: Secondary | ICD-10-CM

## 2015-09-01 DIAGNOSIS — E785 Hyperlipidemia, unspecified: Secondary | ICD-10-CM

## 2015-09-01 DIAGNOSIS — Z23 Encounter for immunization: Secondary | ICD-10-CM | POA: Diagnosis not present

## 2015-09-01 DIAGNOSIS — E113599 Type 2 diabetes mellitus with proliferative diabetic retinopathy without macular edema, unspecified eye: Secondary | ICD-10-CM

## 2015-09-01 DIAGNOSIS — E1165 Type 2 diabetes mellitus with hyperglycemia: Secondary | ICD-10-CM

## 2015-09-01 DIAGNOSIS — I10 Essential (primary) hypertension: Secondary | ICD-10-CM

## 2015-09-01 LAB — LIPID PANEL
CHOL/HDL RATIO: 4
Cholesterol: 235 mg/dL — ABNORMAL HIGH (ref 0–200)
HDL: 53.7 mg/dL (ref >39.00)
LDL Cholesterol: 153 mg/dL — ABNORMAL HIGH (ref 0–99)
NonHDL: 180.91
TRIGLYCERIDES: 140 mg/dL (ref 0.0–149.0)
VLDL: 28 mg/dL (ref 0.0–40.0)

## 2015-09-01 LAB — CBC WITH DIFFERENTIAL/PLATELET
Basophils Absolute: 0 10*3/uL (ref 0.0–0.1)
Basophils Relative: 0.4 % (ref 0.0–3.0)
EOS PCT: 2.2 % (ref 0.0–5.0)
Eosinophils Absolute: 0.1 10*3/uL (ref 0.0–0.7)
HCT: 39.3 % (ref 36.0–46.0)
Hemoglobin: 13.2 g/dL (ref 12.0–15.0)
LYMPHS ABS: 2.3 10*3/uL (ref 0.7–4.0)
Lymphocytes Relative: 36.9 % (ref 12.0–46.0)
MCHC: 33.5 g/dL (ref 30.0–36.0)
MCV: 87.7 fl (ref 78.0–100.0)
MONO ABS: 0.6 10*3/uL (ref 0.1–1.0)
MONOS PCT: 9 % (ref 3.0–12.0)
NEUTROS ABS: 3.3 10*3/uL (ref 1.4–7.7)
NEUTROS PCT: 51.5 % (ref 43.0–77.0)
PLATELETS: 223 10*3/uL (ref 150.0–400.0)
RBC: 4.48 Mil/uL (ref 3.87–5.11)
RDW: 13.6 % (ref 11.5–15.5)
WBC: 6.3 10*3/uL (ref 4.0–10.5)

## 2015-09-01 LAB — HEPATIC FUNCTION PANEL
ALBUMIN: 4.1 g/dL (ref 3.5–5.2)
ALT: 71 U/L — ABNORMAL HIGH (ref 0–35)
AST: 47 U/L — ABNORMAL HIGH (ref 0–37)
Alkaline Phosphatase: 74 U/L (ref 39–117)
Bilirubin, Direct: 0.2 mg/dL (ref 0.0–0.3)
TOTAL PROTEIN: 6.8 g/dL (ref 6.0–8.3)
Total Bilirubin: 1.1 mg/dL (ref 0.2–1.2)

## 2015-09-01 LAB — HM DIABETES EYE EXAM

## 2015-09-01 LAB — TSH: TSH: 2.24 u[IU]/mL (ref 0.35–4.50)

## 2015-09-01 LAB — BASIC METABOLIC PANEL
BUN: 15 mg/dL (ref 6–23)
CALCIUM: 9.6 mg/dL (ref 8.4–10.5)
CO2: 33 meq/L — AB (ref 19–32)
Chloride: 99 mEq/L (ref 96–112)
Creatinine, Ser: 0.69 mg/dL (ref 0.40–1.20)
GFR: 90.46 mL/min (ref >60.00)
GLUCOSE: 109 mg/dL — AB (ref 70–99)
POTASSIUM: 3.5 meq/L (ref 3.5–5.1)
SODIUM: 139 meq/L (ref 135–145)

## 2015-09-01 LAB — HM DIABETES FOOT EXAM: HM DIABETIC FOOT EXAM: NORMAL

## 2015-09-01 NOTE — Patient Instructions (Signed)
Increase Lantus by 2 units every 2-3 days for fasting sugars consistently > 130. Also, consider checking some 2 hours postprandial sugars and if consistently > 180 consider increase Humalog by a couple of units for your pre-meal dose.

## 2015-09-01 NOTE — Progress Notes (Signed)
Pre visit review using our clinic review tool, if applicable. No additional management support is needed unless otherwise documented below in the visit note. 

## 2015-09-01 NOTE — Progress Notes (Signed)
Subjective:    Patient ID: Dana Dyer, female    DOB: 1950/12/27, 64 y.o.   MRN: [id number]  HPI Patient seen for complete physical. She has history of obesity, type 2 diabetes, hypertension, dyslipidemia, GERD, IBS. Diabetes has been poorly controlled. Recent A1c 8.7%. Fasting blood sugars usually over 130. When she checks postprandial blood sugars 2 hours after meals usually over 180. Needs flu vaccine. Also needs Prevnar 13. Last colonoscopy 10 years ago. She gets yearly mammograms. Tetanus up-to-date. Has received prior shingles vaccine. Exercise is inconsistent. She had bone density scan 2 years ago that was excellent with no osteopenia.  Previous intolerance of both GLP-1 and S GLT 2 class of medications  Past Medical History  Diagnosis Date  . DIABETES MELLITUS, UNCONTROLLED 03/16/2009  . HYPERLIPIDEMIA 10/09/2007  . OBSTRUCTIVE SLEEP APNEA 10/09/2007  . HYPERTENSION 10/09/2007  . ALLERGIC RHINITIS 10/09/2007  . FIBROMYALGIA 10/09/2007  . FATIGUE 10/21/2008  . TRANSAMINASES, SERUM, ELEVATED 07/08/2009  . Morton's neuroma   . Plantar fasciitis    Past Surgical History  Procedure Laterality Date  . Tonsillectomy  1959  . Mamoplasty      reduction  . Basal cell carcinoma excision    . Abdominal hysterectomy  1979    prolapsed uterus    reports that she has never smoked. She has never used smokeless tobacco. Her alcohol and drug histories are not on file. family history includes Alcohol abuse in her father; Arthritis in her father and mother; Cancer in her mother; Diabetes in her mother; Hypertension in her maternal grandfather; Miscarriages / [place] in her paternal grandfather. Allergies  Allergen Reactions  . Amoxicillin-Pot Clavulanate     Severe diarrhea  . Codeine Sulfate     REACTION: "hyper"  . Erythromycin Base   . Invokana [Canagliflozin] Other (See Comments)    Recurrent yeast infections  . Irbesartan-Hydrochlorothiazide   . Latex       Review of  Systems  Constitutional: Negative for fever, activity change, appetite change, fatigue and unexpected weight change.  HENT: Negative for ear pain, hearing loss, sore throat and trouble swallowing.   Eyes: Negative for visual disturbance.  Respiratory: Negative for cough and shortness of breath.   Cardiovascular: Negative for chest pain and palpitations.  Gastrointestinal: Negative for abdominal pain, diarrhea, constipation and blood in stool.  Endocrine: Negative for polydipsia and polyuria.  Genitourinary: Negative for dysuria and hematuria.  Musculoskeletal: Positive for arthralgias. Negative for myalgias and back pain.  Skin: Negative for rash.  Neurological: Negative for dizziness, syncope and headaches.  Hematological: Negative for adenopathy.  Psychiatric/Behavioral: Negative for confusion and dysphoric mood.       Objective:   Physical Exam  Constitutional: She is oriented to person, place, and time. She appears well-developed and well-nourished.  HENT:  Head: Normocephalic and atraumatic.  Eyes: EOM are normal. Pupils are equal, round, and reactive to light.  Neck: Normal range of motion. Neck supple. No thyromegaly present.  Cardiovascular: Normal rate, regular rhythm and normal heart sounds.   No murmur heard. Pulmonary/Chest: Breath sounds normal. No respiratory distress. She has no wheezes. She has no rales.  Abdominal: Soft. Bowel sounds are normal. She exhibits no distension and no mass. There is no tenderness. There is no rebound and no guarding.  Musculoskeletal: Normal range of motion. She exhibits no edema.  Lymphadenopathy:    She has no cervical adenopathy.  Neurological: She is alert and oriented to person, place, and time. She displays normal reflexes. No  cranial nerve deficit.  Skin: No rash noted.  Psychiatric: She has a normal mood and affect. Her behavior is normal. Judgment and thought content normal.          Assessment & Plan:  Complete physical.  Prevnar 13 and flu vaccines given. Schedule repeat colonoscopy. Obtain screening lab work. Set up referral to diabetes educator to reinforce dietary factors. Titrate Lantus 2 units every 3 days until fasting blood sugars consistently 130 or less. She will also check more frequent postprandial blood sugars and if consistently over 180 slow titration of her Humalog.

## 2015-09-03 MED ORDER — ROSUVASTATIN CALCIUM 10 MG PO TABS: 10.0000 mg | ORAL_TABLET | Freq: Every day | ORAL | Status: DC

## 2015-09-03 NOTE — Addendum Note (Signed)
Addended by: Ailene Rud E on: 09/03/2015 09:42 AM   Modules accepted: Orders

## 2015-09-08 ENCOUNTER — Encounter: Payer: Self-pay | Admitting: Gastroenterology

## 2015-09-16 ENCOUNTER — Telehealth: Payer: Self-pay | Admitting: Pulmonary Disease

## 2015-09-17 NOTE — Telephone Encounter (Signed)
Pt has been scheduled with RA on 09/21/2015 at 2:45pm. Nothing further was needed.

## 2015-09-21 ENCOUNTER — Encounter: Payer: Self-pay | Admitting: Pulmonary Disease

## 2015-09-21 ENCOUNTER — Ambulatory Visit (INDEPENDENT_AMBULATORY_CARE_PROVIDER_SITE_OTHER): Payer: Medicare Other | Admitting: Pulmonary Disease

## 2015-09-21 VITALS — BP 138/80 | HR 95 | Ht 64.0 in | Wt 183.0 lb

## 2015-09-21 DIAGNOSIS — G4733 Obstructive sleep apnea (adult) (pediatric): Secondary | ICD-10-CM

## 2015-09-21 DIAGNOSIS — I10 Essential (primary) hypertension: Secondary | ICD-10-CM | POA: Diagnosis not present

## 2015-09-21 NOTE — Progress Notes (Signed)
   Subjective:    Patient ID: Dana Dyer, female    DOB: 1951-06-10, 64 y.o.   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 PSG 04/2005 showed mild OSA with AHI 12/hr, severe in REM with AHI 32/h --> corrected with CPAP +10 cm , pillows with chin strap ( confirmed by download on auto )  09/21/2015  Chief Complaint  Patient presents with  . Follow-up    pt following for OSA: pt states she is doing well and loves the CPAP. pt using CPAP everynight for about 8 hrs. nasal cannula and pressure good for pt. no concerns DME: [facility]   She stopped using her CPAP after her last visit in 2011 and her symptoms returned HST 06/2015 >> mild OSA 13/hr - new CPAP Rx written  Wt unchanged since 2011-she has lost 20 pounds since her study in 2006 and has managed to keep it off.  Her diabetes has been uncontrolled lately-she is on 3 medications for hypertension  Download 09/2015 >> 10 cm, no residuals, excellent usage, no leak  Review of Systems neg for any significant sore throat, dysphagia, itching, sneezing, nasal congestion or excess/ purulent secretions, fever, chills, sweats, unintended wt loss, pleuritic or exertional cp, hempoptysis, orthopnea pnd or change in chronic leg swelling. Also denies presyncope, palpitations, heartburn, abdominal pain, nausea, vomiting, diarrhea or change in bowel or urinary habits, dysuria,hematuria, rash, arthralgias, visual complaints, headache, numbness weakness or ataxia.     Objective:   Physical Exam  Gen. Pleasant, obese, in no distress ENT - no lesions, no post nasal drip Neck: No JVD, no thyromegaly, no carotid bruits Lungs: no use of accessory muscles, no dullness to percussion, decreased without rales or rhonchi  Cardiovascular: Rhythm regular, heart sounds  normal, no murmurs or gallops, no peripheral edema Musculoskeletal: No deformities, no cyanosis or clubbing , no tremors       Assessment & Plan:

## 2015-09-21 NOTE — Assessment & Plan Note (Signed)
Well controlled 

## 2015-09-21 NOTE — Patient Instructions (Signed)
Your CPAP is set at 10 cm CPAP supplies will be renewed x 1 year

## 2015-09-21 NOTE — Assessment & Plan Note (Signed)
CPAP is set at 10 cm CPAP supplies will be renewed x 1 year  Weight loss encouraged, compliance with goal of at least 4-6 hrs every night is the expectation. Advised against medications with sedative side effects Cautioned against driving when sleepy - understanding that sleepiness will vary on a day to day basis

## 2015-09-29 LAB — HM DIABETES EYE EXAM

## 2015-10-06 ENCOUNTER — Encounter: Payer: Self-pay | Admitting: Family Medicine

## 2015-10-08 ENCOUNTER — Other Ambulatory Visit: Payer: Self-pay | Admitting: Family Medicine

## 2015-10-16 ENCOUNTER — Other Ambulatory Visit: Payer: Self-pay | Admitting: Pulmonary Disease

## 2015-10-16 DIAGNOSIS — Z9989 Dependence on other enabling machines and devices: Principal | ICD-10-CM

## 2015-10-16 DIAGNOSIS — G4733 Obstructive sleep apnea (adult) (pediatric): Secondary | ICD-10-CM

## 2015-11-02 ENCOUNTER — Ambulatory Visit (AMBULATORY_SURGERY_CENTER): Payer: Self-pay | Admitting: *Deleted

## 2015-11-02 VITALS — Ht 62.0 in | Wt 182.8 lb

## 2015-11-02 DIAGNOSIS — Z1211 Encounter for screening for malignant neoplasm of colon: Secondary | ICD-10-CM

## 2015-11-02 MED ORDER — SUPREP BOWEL PREP KIT 17.5-3.13-1.6 GM/177ML PO SOLN: 1.0000 | Freq: Once | ORAL | Status: DC

## 2015-11-02 NOTE — Progress Notes (Signed)
Patient denies any allergies to egg or soy products. Patient denies complications with anesthesia/sedation.  Patient denies oxygen use at home and denies diet medications. Emmi instructions for colonoscopy explained and given to patient.

## 2015-11-16 ENCOUNTER — Encounter: Payer: Self-pay | Admitting: Gastroenterology

## 2015-11-16 ENCOUNTER — Ambulatory Visit (AMBULATORY_SURGERY_CENTER): Payer: Medicare Other | Admitting: Gastroenterology

## 2015-11-16 VITALS — BP 120/72 | HR 80 | Temp 96.2°F | Resp 20 | Ht 64.0 in | Wt 183.0 lb

## 2015-11-16 DIAGNOSIS — D120 Benign neoplasm of cecum: Secondary | ICD-10-CM | POA: Diagnosis not present

## 2015-11-16 DIAGNOSIS — D124 Benign neoplasm of descending colon: Secondary | ICD-10-CM | POA: Diagnosis not present

## 2015-11-16 DIAGNOSIS — Z1211 Encounter for screening for malignant neoplasm of colon: Secondary | ICD-10-CM | POA: Diagnosis present

## 2015-11-16 DIAGNOSIS — D125 Benign neoplasm of sigmoid colon: Secondary | ICD-10-CM | POA: Diagnosis not present

## 2015-11-16 MED ORDER — SODIUM CHLORIDE 0.9 % IV SOLN: 500.0000 mL | INTRAVENOUS | Status: DC

## 2015-11-16 NOTE — Patient Instructions (Signed)
YOU HAD AN ENDOSCOPIC PROCEDURE TODAY AT [facility]:   Refer to the procedure report that was given to you for any specific questions about what was found during the examination.  If the procedure report does not answer your questions, please call your gastroenterologist to clarify.  If you requested that your care partner not be given the details of your procedure findings, then the procedure report has been included in a sealed envelope for you to review at your convenience later.  YOU SHOULD EXPECT: Some feelings of bloating in the abdomen. Passage of more gas than usual.  Walking can help get rid of the air that was put into your GI tract during the procedure and reduce the bloating. If you had a lower endoscopy (such as a colonoscopy or flexible sigmoidoscopy) you may notice spotting of blood in your stool or on the toilet paper. If you underwent a bowel prep for your procedure, you may not have a normal bowel movement for a few days.  Please Note:  You might notice some irritation and congestion in your nose or some drainage.  This is from the oxygen used during your procedure.  There is no need for concern and it should clear up in a day or so.  SYMPTOMS TO REPORT IMMEDIATELY:   Following lower endoscopy (colonoscopy or flexible sigmoidoscopy):  Excessive amounts of blood in the stool  Significant tenderness or worsening of abdominal pains  Swelling of the abdomen that is new, acute  Fever of 100F or higher  For urgent or emergent issues, a gastroenterologist can be reached at any hour by calling [phone].   DIET: Your first meal following the procedure should be a small meal and then it is ok to progress to your normal diet. Heavy or fried foods are harder to digest and may make you feel nauseous or bloated.  Likewise, meals heavy in dairy and vegetables can increase bloating.  Drink plenty of fluids but you should avoid alcoholic beverages for 24  hours.  ACTIVITY:  You should plan to take it easy for the rest of today and you should NOT DRIVE or use heavy machinery until tomorrow (because of the sedation medicines used during the test).    FOLLOW UP: Our staff will call the number listed on your records the next business day following your procedure to check on you and address any questions or concerns that you may have regarding the information given to you following your procedure. If we do not reach you, we will leave a message.  However, if you are feeling well and you are not experiencing any problems, there is no need to return our call.  We will assume that you have returned to your regular daily activities without incident.  If any biopsies were taken you will be contacted by phone or by letter within the next 1-3 weeks.  Please call us at [phone] if you have not heard about the biopsies in 3 weeks.    SIGNATURES/CONFIDENTIALITY: You and/or your care partner have signed paperwork which will be entered into your electronic medical record.  These signatures attest to the fact that that the information above on your After Visit Summary has been reviewed and is understood.  Full responsibility of the confidentiality of this discharge information lies with you and/or your care-partner.  Next colonoscopy determined by pathology results. Please review polyp, diverticulosis, and high fiber diet handouts provided.

## 2015-11-16 NOTE — Op Note (Signed)
[facility]  [address] [place], [address]   COLONOSCOPY PROCEDURE REPORT  PATIENT: Dana Dyer, Dana Dyer  MR#: [id number] BIRTHDATE: 09/09/1951 , 74  yrs. old GENDER: female ENDOSCOPIST: Milus Banister, MD REFERRED [id number] Elease Hashimoto, M.D. PROCEDURE DATE:  11/16/2015 PROCEDURE:   Colonoscopy, screening and Colonoscopy with snare polypectomy First Screening Colonoscopy - Avg.  risk and is 50 yrs.  old or older - No.  Prior Negative Screening - Now for repeat screening. 10 or more years since last screening  History of Adenoma - Now for follow-up colonoscopy & has been > or = to 3 yrs.  N/A  Polyps removed today? Yes ASA CLASS:   Class II INDICATIONS:Screening for colonic neoplasia, Colorectal Neoplasm Risk Assessment for this procedure is average risk, and Colonoscopy Dr.  [facility] [employer] 2006 found no polyps. MEDICATIONS: Monitored anesthesia care and Propofol 300 mg IV  DESCRIPTION OF PROCEDURE:   After the risks benefits and alternatives of the procedure were thoroughly explained, informed consent was obtained.  The digital rectal exam revealed no abnormalities of the rectum.   The LB [id number]  endoscope was introduced through the anus and advanced to the cecum, which was identified by both the appendix and ileocecal valve. No adverse events experienced.   The quality of the prep was excellent.  The instrument was then slowly withdrawn as the colon was fully examined. Estimated blood loss is zero unless otherwise noted in this procedure report.  COLON FINDINGS: Seven sessile polyps ranging between 3-49m in size were found in the sigmoid colon, descending colon, and at the cecum.  Polypectomies were performed with a cold snare.  The resection was complete, the polyp tissue was completely retrieved and sent to histology.   There was mild diverticulosis noted in the left colon.   The examination was otherwise normal.  Retroflexed views revealed no  abnormalities. The time to cecum = 3.1 Withdrawal time = 12.6   The scope was withdrawn and the procedure completed. COMPLICATIONS: There were no immediate complications.  ENDOSCOPIC IMPRESSION: 1.  Seven sessile polyps ranging between 3-733min size were found in the sigmoid colon, descending colon, and at the cecum; polypectomies were performed with a cold snare 2.   Mild diverticulosis was noted in the left colon 3.   The examination was otherwise normal  RECOMMENDATIONS: If the polyp(s) removed today are proven to be adenomatous (pre-cancerous) polyps, you will need a colonoscopy in 3 years. Otherwise you should continue to follow colorectal cancer screening guidelines for "routine risk" patients with a colonoscopy in 10 years.  You will receive a letter within 1-2 weeks with the results of your biopsy as well as final recommendations.  Please call my office if you have not received a letter after 3 weeks.  eSigned:  DaMilus BanisterMD 11/16/2015 9:05 AM

## 2015-11-16 NOTE — Progress Notes (Signed)
Patient denies any allergies to eggs or soy.

## 2015-11-16 NOTE — Progress Notes (Signed)
Called to room to assist during endoscopic procedure.  Patient ID and intended procedure confirmed with present staff. Received instructions for my participation in the procedure from the performing physician.  

## 2015-11-16 NOTE — Progress Notes (Signed)
To recovery, report to Doyle Askew, Therapist, sports, VSS

## 2015-11-17 ENCOUNTER — Telehealth: Payer: Self-pay | Admitting: *Deleted

## 2015-11-17 NOTE — Telephone Encounter (Signed)
  Follow up Call-  Call back number 11/16/2015  Post procedure Call Back phone  # [phone]  Permission to leave phone message Yes     Patient questions:  Do you have a fever, pain , or abdominal swelling? No. Pain Score  0 *  Have you tolerated food without any problems? Yes.    Have you been able to return to your normal activities? Yes.    Do you have any questions about your discharge instructions: Diet   No. Medications  No. Follow up visit  No.  Do you have questions or concerns about your Care? Yes.    Actions: * If pain score is 4 or above: No action needed, pain <4.

## 2015-11-24 ENCOUNTER — Encounter: Payer: Self-pay | Admitting: Gastroenterology

## 2015-11-25 ENCOUNTER — Other Ambulatory Visit: Payer: Self-pay | Admitting: Family Medicine

## 2015-11-26 ENCOUNTER — Other Ambulatory Visit: Payer: Self-pay | Admitting: Family Medicine

## 2015-12-10 ENCOUNTER — Telehealth: Payer: Self-pay | Admitting: Pulmonary Disease

## 2015-12-10 NOTE — Telephone Encounter (Signed)
Compliance Report results. Per RA: CPAP 10cm very effective  Good usage Continue same.

## 2015-12-11 NOTE — Telephone Encounter (Signed)
Spoke with pt, aware of download report results.  Nothing further needed.

## 2015-12-16 ENCOUNTER — Encounter: Payer: Self-pay | Admitting: Pulmonary Disease

## 2016-01-04 ENCOUNTER — Other Ambulatory Visit: Payer: Self-pay | Admitting: Family Medicine

## 2016-01-08 ENCOUNTER — Other Ambulatory Visit: Payer: Self-pay | Admitting: Family Medicine

## 2016-01-12 ENCOUNTER — Ambulatory Visit (INDEPENDENT_AMBULATORY_CARE_PROVIDER_SITE_OTHER): Payer: Medicare Other | Admitting: Pulmonary Disease

## 2016-01-12 ENCOUNTER — Encounter: Payer: Self-pay | Admitting: Pulmonary Disease

## 2016-01-12 ENCOUNTER — Other Ambulatory Visit: Payer: Self-pay | Admitting: Family Medicine

## 2016-01-12 VITALS — BP 144/80 | HR 103 | Ht 62.5 in | Wt 180.6 lb

## 2016-01-12 DIAGNOSIS — G4733 Obstructive sleep apnea (adult) (pediatric): Secondary | ICD-10-CM | POA: Diagnosis not present

## 2016-01-12 DIAGNOSIS — J4520 Mild intermittent asthma, uncomplicated: Secondary | ICD-10-CM | POA: Diagnosis not present

## 2016-01-12 MED ORDER — LEVOFLOXACIN 500 MG PO TABS: 500.0000 mg | ORAL_TABLET | Freq: Every day | ORAL | Status: DC

## 2016-01-12 NOTE — Assessment & Plan Note (Signed)
Levaquin 500 daily x 7 days - Ok to stop on D 5 if better Mucinex DM 600 twice daily x 7 days  Call if no better

## 2016-01-12 NOTE — Progress Notes (Signed)
   Subjective:    Patient ID: Dana Dyer, female    DOB: 1951-03-13, 65 y.o.   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 PSG 04/2005 showed mild OSA with AHI 12/hr, severe in REM with AHI 32/h --> corrected with CPAP +10 cm , pillows with chin strap ( confirmed by download on auto )   01/12/2016  Chief Complaint  Patient presents with  . Acute Visit    pt. c/o dry cough sometimes prod. cough green in color. earache. head congestion X6d   This is an acute visit C/o URI symptoms followed by sinus congestion & discharge - green , with headache & low grade fever - has not improved x 5 days, hence made appt  Her diabetes remains uncontrolled lately-she is on 3 medications for hypertension She is compliant with her CPAP  She stopped using her CPAP  in 2011 , back on it since 06/2015 HST 06/2015 >> mild OSA 13/hr - new CPAP Rx  Download 12/2015 >> 10 cm, no residuals, excellent usage, no leak  Review of Systems  neg for any significant sore throat, dysphagia, itching, sneezing, nasal congestion or excess/ purulent secretions, fever, chills, sweats, unintended wt loss, pleuritic or exertional cp, hempoptysis, orthopnea pnd or change in chronic leg swelling. Also denies presyncope, palpitations, heartburn, abdominal pain, nausea, vomiting, diarrhea or change in bowel or urinary habits, dysuria,hematuria, rash, arthralgias, visual complaints, headache, numbness weakness or ataxia.     Objective:   Physical Exam  Gen. Pleasant, well-nourished, in no distress ENT - mild sinus tenderness, frontal, no post nasal drip Neck: No JVD, no thyromegaly, no carotid bruits Lungs: no use of accessory muscles, no dullness to percussion, clear without rales or rhonchi  Cardiovascular: Rhythm regular, heart sounds  normal, no murmurs or gallops, no peripheral edema Musculoskeletal:  no cyanosis or clubbing        Assessment & Plan:

## 2016-01-12 NOTE — Assessment & Plan Note (Signed)
Ct CPAP 10 cm

## 2016-01-12 NOTE — Patient Instructions (Signed)
Levaquin 500 daily x 7 days - Ok to stop on D 5 if better Mucinex DM 600 twice daily x 7 days

## 2016-02-11 ENCOUNTER — Encounter (INDEPENDENT_AMBULATORY_CARE_PROVIDER_SITE_OTHER): Payer: Medicare Other | Admitting: Podiatry

## 2016-02-11 ENCOUNTER — Ambulatory Visit: Payer: Self-pay

## 2016-02-11 DIAGNOSIS — M79671 Pain in right foot: Secondary | ICD-10-CM

## 2016-02-11 NOTE — Progress Notes (Signed)
This encounter was created in error - please disregard.

## 2016-02-25 ENCOUNTER — Ambulatory Visit: Payer: Medicare Other | Admitting: Podiatry

## 2016-02-25 ENCOUNTER — Ambulatory Visit (INDEPENDENT_AMBULATORY_CARE_PROVIDER_SITE_OTHER): Payer: Medicare Other | Admitting: Family Medicine

## 2016-02-25 ENCOUNTER — Encounter: Payer: Self-pay | Admitting: Family Medicine

## 2016-02-25 VITALS — BP 138/84 | HR 86 | Ht 62.5 in | Wt 185.0 lb

## 2016-02-25 DIAGNOSIS — M66879 Spontaneous rupture of other tendons, unspecified ankle and foot: Secondary | ICD-10-CM | POA: Insufficient documentation

## 2016-02-25 DIAGNOSIS — M66871 Spontaneous rupture of other tendons, right ankle and foot: Secondary | ICD-10-CM | POA: Diagnosis not present

## 2016-02-25 NOTE — Assessment & Plan Note (Signed)
Patient does have more of a tendon tear noted. I do think it is not dramatic. We discussed icing regimen and home exercises. We discussed which activities to do an which was potentially avoid. We discussed avoiding certain activities. Patient will wear shoes as well as a heel lift. Patient given trial topical anti-inflammatories work with Product/process development scientist to learn home exercise. Follow-up in 3 weeks.

## 2016-02-25 NOTE — Progress Notes (Signed)
Pre visit review using our clinic review tool, if applicable. No additional management support is needed unless otherwise documented below in the visit note. 

## 2016-02-25 NOTE — Progress Notes (Signed)
[name] [address], [place] [address] Phone: [phone] Subjective:    I'm seeing this patient by the request  of:  Eulas Post, MD   CC: right ankle pain  RDE:[id number] Dana Dyer is a 65 y.o. female coming in with complaint of right ankle pain. Patient states that this started in September when she was wearing specific all shoes. Started having worsening symptoms and stopped wearing them. Seem to get better and then she warm one time in January and started having severe pain and swelling on the medial aspect of the right ankle. Since then it has not improved. Very slowly may be getting better but continues to have discomfort. Patient has done some icing as well as change shoes and inserts which is made some mild improvement. Still state that she has 5 out of 10 pain most days of the week. Sometimes can be very sore at the end of the night as well. Denies any numbness or weakness. Has not notice any significant swelling recently.     Past Medical History  Diagnosis Date  . DIABETES MELLITUS, UNCONTROLLED 03/16/2009  . OBSTRUCTIVE SLEEP APNEA 10/09/2007  . HYPERTENSION 10/09/2007  . ALLERGIC RHINITIS 10/09/2007  . FIBROMYALGIA 10/09/2007  . FATIGUE 10/21/2008  . TRANSAMINASES, SERUM, ELEVATED 07/08/2009  . Morton's neuroma   . Plantar fasciitis     History - right  . Arthritis     hips, spine  . GERD (gastroesophageal reflux disease)     diet  controlled, no meds  . Sleep apnea     Uses CPAP  . HYPERLIPIDEMIA 10/09/2007    diet controlled, no meds  . H/O vaginal hysterectomy    Past Surgical History  Procedure Laterality Date  . Tonsillectomy  1959  . Mamoplasty      reduction  . Basal cell carcinoma excision    . Abdominal hysterectomy  1979    prolapsed uterus  . Tubal ligation  1976  . Colonoscopy  2006    [place] History   Social History  . Marital Status: Married    Spouse Name: N/A  . Number of  Children: N/A  . Years of Education: N/A   Social History Main Topics  . Smoking status: Never Smoker   . Smokeless tobacco: Never Used  . Alcohol Use: No  . Drug Use: No  . Sexual Activity: Yes    Birth Control/ Protection: Other-see comments, Post-menopausal     Comment: Hysterectomy   Other Topics Concern  . None   Social History Narrative   Allergies  Allergen Reactions  . Amoxicillin-Pot Clavulanate     Severe diarrhea  . Codeine Sulfate     REACTION: "hyper"  . Erythromycin Base   . Invokana [Canagliflozin] Other (See Comments)    Recurrent yeast infections  . Irbesartan-Hydrochlorothiazide   . Latex    Family History  Problem Relation Age of Onset  . Diabetes Mother   . Arthritis Mother   . Cancer Mother     breast  . Alcohol abuse Father   . Arthritis Father   . Hypertension Maternal Grandfather   . Miscarriages / Stillbirths Paternal Grandfather   . Colon polyps Neg Hx   . Colon cancer Neg Hx   . Esophageal cancer Neg Hx   . Rectal cancer Neg Hx   . Stomach cancer Neg Hx     Past medical history, social, surgical and family history all reviewed in electronic  medical record.  No pertanent information unless stated regarding to the chief complaint.   Review of Systems: No headache, visual changes, nausea, vomiting, diarrhea, constipation, dizziness, abdominal pain, skin rash, fevers, chills, night sweats, weight loss, swollen lymph nodes, body aches, joint swelling, muscle aches, chest pain, shortness of breath, mood changes.   Objective Blood pressure 138/84, pulse 86, height 5' 2.5" (1.588 m), weight 185 lb (83.915 kg), SpO2 95 %.  General: No apparent distress alert and oriented x3 mood and affect normal, dressed appropriately.  HEENT: Pupils equal, extraocular movements intact  Respiratory: Patient's speak in full sentences and does not appear short of breath  Cardiovascular: No lower extremity edema, non tender, no erythema  Skin: Warm dry intact  with no signs of infection or rash on extremities or on axial skeleton.  Abdomen: Soft nontender  Neuro: Cranial nerves II through XII are intact, neurovascularly intact in all extremities with 2+ DTRs and 2+ pulses.  Lymph: No lymphadenopathy of posterior or anterior cervical chain or axillae bilaterally.  Gait normal with good balance and coordination.  MSK:  Non tender with full range of motion and good stability and symmetric strength and tone of shoulders, elbows, wrist, hip, knee bilaterally. Arthritic changes of multiple joints Ankle:right No visible erythema or swelling. Range of motion is full in all directions. Strength is 5/5 in all directions. Stable lateral and medial ligaments; squeeze test and kleiger test unremarkable; Talar dome nontender; No pain at base of 5th MT; No tenderness over cuboid; Mild pain over the navicular prominence with more over the posterior tibialis tendon. No sign of peroneal tendon subluxations or tenderness to palpation Negative tarsal tunnel tinel's Able to walk 4 steps. Contralateral ankle unremarkable  MSK US performed of: right This study was ordered, performed, and interpreted by Charlann Boxer D.O.  Foot/Ankle:   All structures visualized.   Talar dome unremarkable  Ankle mortise without effusion. Peroneus longus and brevis tendons unremarkable on long and transverse views without sheath effusions. Posterior tibialis has significant hypoechoic changes within the tendon sheath. Increase Doppler flow.appears to have intersubstance tearing near the navicular bone with calcific changes which could be potentially an avulsion fracture Achilles tendon visualized along length of tendon and unremarkable on long and transverse views without sheath effusion. Anterior Talofibular Ligament and Calcaneofibular Ligaments unremarkable and intact. Deltoid Ligament unremarkable and intact. Plantar fascia intact and without effusion, normal thickness. No  increased doppler signal, cap sign, or thickening of tibial cortex. Images saved in hard drive  IMPRESSION:  Posterior tibialis tear intersubstance with mild avulsion of the navicular with minimal healing  Procedure note 66294; 15 minutes spent for Therapeutic exercises as stated in above notes.  This included exercises focusing on stretching, strengthening, with significant focus on eccentric aspects. Basic range of motion exercises to allow proper full motion at ankle Stretching of the lower leg and hamstrings  Theraband exercises for the lower leg - inversion, eversion, dorsiflexion and plantarflexion each to be completed with a theraband Balance exercises to increase proprioception Weight bearing exercises to increase strength and balance Proper technique shown and discussed handout in great detail with ATC.  All questions were discussed and answered.     Impression and Recommendations:     This case required medical decision making of moderate complexity.      Note: This dictation was prepared with Dragon dictation along with smaller phrase technology. Any transcriptional errors that result from this process are unintentional.

## 2016-02-25 NOTE — Patient Instructions (Signed)
Good to see you  Ice is your friend Ice 20 minutes 2 times daily. Usually after activity and before bed. Heel lift in your shoe at all times Vitamin D 4000 IU daily for next month then 2000 IU daily thereafter pennsaid pinkie amount topically 2 times daily as needed.  Avoid being barefoot Exercises 3 times a week.  See me again in 3 weeks.

## 2016-03-10 ENCOUNTER — Ambulatory Visit (INDEPENDENT_AMBULATORY_CARE_PROVIDER_SITE_OTHER): Payer: Medicare Other | Admitting: Family Medicine

## 2016-03-10 VITALS — BP 140/82 | HR 93 | Temp 98.7°F | Ht 62.5 in | Wt 182.0 lb

## 2016-03-10 DIAGNOSIS — IMO0002 Reserved for concepts with insufficient information to code with codable children: Secondary | ICD-10-CM

## 2016-03-10 DIAGNOSIS — E1165 Type 2 diabetes mellitus with hyperglycemia: Secondary | ICD-10-CM | POA: Diagnosis not present

## 2016-03-10 DIAGNOSIS — E785 Hyperlipidemia, unspecified: Secondary | ICD-10-CM | POA: Diagnosis not present

## 2016-03-10 DIAGNOSIS — I10 Essential (primary) hypertension: Secondary | ICD-10-CM

## 2016-03-10 DIAGNOSIS — Z794 Long term (current) use of insulin: Secondary | ICD-10-CM | POA: Diagnosis not present

## 2016-03-10 DIAGNOSIS — E1142 Type 2 diabetes mellitus with diabetic polyneuropathy: Secondary | ICD-10-CM | POA: Diagnosis not present

## 2016-03-10 LAB — POCT GLYCOSYLATED HEMOGLOBIN (HGB A1C): Hemoglobin A1C: 9.5

## 2016-03-10 MED ORDER — DULAGLUTIDE 0.75 MG/0.5ML ~~LOC~~ SOAJ: 0.7500 mg | SUBCUTANEOUS | Status: DC

## 2016-03-10 MED ORDER — PITAVASTATIN CALCIUM 2 MG PO TABS: ORAL_TABLET | ORAL | Status: DC

## 2016-03-10 NOTE — Progress Notes (Signed)
Pre visit review using our clinic review tool, if applicable. No additional management support is needed unless otherwise documented below in the visit note. 

## 2016-03-10 NOTE — Patient Instructions (Signed)
Try taking the Livalo one half tablet every Monday, Wednesday, and Friday.

## 2016-03-10 NOTE — Progress Notes (Signed)
Subjective:    Patient ID: Dana Dyer, female    DOB: 09-Aug-1951, 65 y.o.   MRN: [id number]  HPI Medical follow-up  Type 2 diabetes. History of poor control. Recent foot injury has limited activity. Most recent A1c 8.7%. Currently takes 62 units of Lantus once daily and Humalog 10-15 units with lunch and supper. No recent hypoglycemia. Fasting blood sugars usually range 130-160.  Hyperlipidemia. Most recent LDL 153. She's tried multiple statins including Lipitor, simvastatin, Crestor and had myalgias with all those.  Hypertension. Treated with losartan HCTZ and amlodipine. Compliant with therapy. No headaches. Not monitoring blood pressure at home. No recent chest pains.  Past Medical History  Diagnosis Date  . DIABETES MELLITUS, UNCONTROLLED 03/16/2009  . OBSTRUCTIVE SLEEP APNEA 10/09/2007  . HYPERTENSION 10/09/2007  . ALLERGIC RHINITIS 10/09/2007  . FIBROMYALGIA 10/09/2007  . FATIGUE 10/21/2008  . TRANSAMINASES, SERUM, ELEVATED 07/08/2009  . Morton's neuroma   . Plantar fasciitis     History - right  . Arthritis     hips, spine  . GERD (gastroesophageal reflux disease)     diet  controlled, no meds  . Sleep apnea     Uses CPAP  . HYPERLIPIDEMIA 10/09/2007    diet controlled, no meds  . H/O vaginal hysterectomy    Past Surgical History  Procedure Laterality Date  . Tonsillectomy  1959  . Mamoplasty      reduction  . Basal cell carcinoma excision    . Abdominal hysterectomy  1979    prolapsed uterus  . Tubal ligation  1976  . Colonoscopy  2006    Timmothy Euler    reports that she has never smoked. She has never used smokeless tobacco. She reports that she does not drink alcohol or use illicit drugs. family history includes Alcohol abuse in her father; Arthritis in her father and mother; Cancer in her mother; Diabetes in her mother; Hypertension in her maternal grandfather; Miscarriages / [place] in her paternal grandfather. There is no history of Colon polyps, Colon  cancer, Esophageal cancer, Rectal cancer, or Stomach cancer. Allergies  Allergen Reactions  . Amoxicillin-Pot Clavulanate     Severe diarrhea  . Codeine Sulfate     REACTION: "hyper"  . Crestor [Rosuvastatin Calcium] Other (See Comments)    myalgia  . Erythromycin Base   . Invokana [Canagliflozin] Other (See Comments)    Recurrent yeast infections  . Irbesartan-Hydrochlorothiazide   . Latex       Review of Systems  Constitutional: Negative for fever, activity change, appetite change, fatigue and unexpected weight change.  HENT: Negative for ear pain, hearing loss, sore throat and trouble swallowing.   Eyes: Negative for visual disturbance.  Respiratory: Negative for cough and shortness of breath.   Cardiovascular: Negative for chest pain and palpitations.  Gastrointestinal: Negative for abdominal pain, diarrhea, constipation and blood in stool.  Genitourinary: Negative for dysuria and hematuria.  Musculoskeletal: Negative for myalgias, back pain and arthralgias.  Skin: Negative for rash.  Neurological: Negative for dizziness, syncope and headaches.  Hematological: Negative for adenopathy.  Psychiatric/Behavioral: Negative for confusion and dysphoric mood.       Objective:   Physical Exam  Constitutional: She appears well-developed and well-nourished.  Eyes: Pupils are equal, round, and reactive to light.  Neck: Neck supple. No JVD present. No thyromegaly present.  Cardiovascular: Normal rate and regular rhythm.  Exam reveals no gallop.   Pulmonary/Chest: Effort normal and breath sounds normal. No respiratory distress. She has no wheezes. She has  no rales.  Musculoskeletal: She exhibits no edema.  Neurological: She is alert.  Skin:  Feet reveal no skin lesions. Good distal foot pulses. Good capillary refill. No calluses. Normal sensation with monofilament testing           Assessment & Plan:  #1 type 2 diabetes. History of poor control. Recheck A1c. A1C=9.5%.  We  discussed options.  She had previous nausea with Victoza- but has not tried longer acting GLP-1 medications such as Trulicity or Bydurion  We explained they have lower incidence of nausea. Because of her difficulties losing weight we have recommended another trial with Trulicity 0.94 mg once weekly  #2 hyperlipidemia. Prior intolerance with multiple statins. Would like her to try Livalo 2 mg one half tablet every Monday Wednesday and Friday. Reassess in 3 months. If tolerating well that point consider further titration  #3 hypertension. Borderline elevation today. Lose some weight. Reassess 3 months and if not improved at that point consider additional medication.

## 2016-03-17 ENCOUNTER — Other Ambulatory Visit (INDEPENDENT_AMBULATORY_CARE_PROVIDER_SITE_OTHER): Payer: Medicare Other

## 2016-03-17 ENCOUNTER — Ambulatory Visit (INDEPENDENT_AMBULATORY_CARE_PROVIDER_SITE_OTHER): Payer: Medicare Other | Admitting: Family Medicine

## 2016-03-17 ENCOUNTER — Encounter: Payer: Self-pay | Admitting: Family Medicine

## 2016-03-17 VITALS — BP 140/90 | HR 96 | Ht 62.5 in | Wt 180.5 lb

## 2016-03-17 DIAGNOSIS — M66871 Spontaneous rupture of other tendons, right ankle and foot: Secondary | ICD-10-CM

## 2016-03-17 DIAGNOSIS — G5751 Tarsal tunnel syndrome, right lower limb: Secondary | ICD-10-CM | POA: Insufficient documentation

## 2016-03-17 MED ORDER — GABAPENTIN 100 MG PO CAPS: 200.0000 mg | ORAL_CAPSULE | Freq: Every day | ORAL | Status: DC

## 2016-03-17 NOTE — Progress Notes (Signed)
Dana Dyer, Dana Dyer Phone: [phone] Subjective:    I'm seeing this patient by the request  of:  Eulas Post, MD   CC: right ankle pain f/u   Dana Dyer is a 65 y.o. female coming in with complaint of right ankle pain. Patient sent have a posterior tibialis tendon tear with small avulsion of the navicular bone. Patient was put in a specialized shoe. Patient was to avoid certain activities. Icing protocol. Patient states overall she is doing about 50-60% better. States that if she walks more than 3000 steps a day she has difficulty. Other than that she seems to be doing better. No more swelling. No more redness. Overall feeling like she is making progress. No side effects to the medications. Sometimes though does have a little bit of a numbness that can be there.     Past Medical History  Diagnosis Date  . DIABETES MELLITUS, UNCONTROLLED 03/16/2009  . OBSTRUCTIVE SLEEP APNEA 10/09/2007  . HYPERTENSION 10/09/2007  . ALLERGIC RHINITIS 10/09/2007  . FIBROMYALGIA 10/09/2007  . FATIGUE 10/21/2008  . TRANSAMINASES, SERUM, ELEVATED 07/08/2009  . Morton's neuroma   . Plantar fasciitis     History - right  . Arthritis     hips, spine  . GERD (gastroesophageal reflux disease)     diet  controlled, no meds  . Sleep apnea     Uses CPAP  . HYPERLIPIDEMIA 10/09/2007    diet controlled, no meds  . H/O vaginal hysterectomy    Past Surgical History  Procedure Laterality Date  . Tonsillectomy  1959  . Mamoplasty      reduction  . Basal cell carcinoma excision    . Abdominal hysterectomy  1979    prolapsed uterus  . Tubal ligation  1976  . Colonoscopy  2006    [place] History   Social History  . Marital Status: Married    Spouse Name: N/A  . Number of Children: N/A  . Years of Education: N/A   Social History Main Topics  . Smoking status: Never Smoker   . Smokeless tobacco: Never Used    . Alcohol Use: No  . Drug Use: No  . Sexual Activity: Yes    Birth Control/ Protection: Other-see comments, Post-menopausal     Comment: Hysterectomy   Other Topics Concern  . None   Social History Narrative   Allergies  Allergen Reactions  . Amoxicillin-Pot Clavulanate     Severe diarrhea  . Codeine Sulfate     REACTION: "hyper"  . Crestor [Rosuvastatin Calcium] Other (See Comments)    myalgia  . Erythromycin Base   . Invokana [Canagliflozin] Other (See Comments)    Recurrent yeast infections  . Irbesartan-Hydrochlorothiazide   . Latex   . Victoza [Liraglutide] Nausea Only   Family History  Problem Relation Age of Onset  . Diabetes Mother   . Arthritis Mother   . Cancer Mother     breast  . Alcohol abuse Father   . Arthritis Father   . Hypertension Maternal Grandfather   . Miscarriages / Stillbirths Paternal Grandfather   . Colon polyps Neg Hx   . Colon cancer Neg Hx   . Esophageal cancer Neg Hx   . Rectal cancer Neg Hx   . Stomach cancer Neg Hx     Past medical history, social, surgical and family history all reviewed in electronic medical record.  No pertanent information unless stated  regarding to the chief complaint.   Review of Systems: No headache, visual changes, nausea, vomiting, diarrhea, constipation, dizziness, abdominal pain, skin rash, fevers, chills, night sweats, weight loss, swollen lymph nodes, body aches, joint swelling, muscle aches, chest pain, shortness of breath, mood changes.   Objective Blood pressure 140/90, pulse 96, height 5' 2.5" (1.588 m), weight 180 lb 8 oz (81.874 kg), SpO2 98 %.  General: No apparent distress alert and oriented x3 mood and affect normal, dressed appropriately.  HEENT: Pupils equal, extraocular movements intact  Respiratory: Patient's speak in full sentences and does not appear short of breath  Cardiovascular: No lower extremity edema, non tender, no erythema  Skin: Warm dry intact with no signs of infection or  rash on extremities or on axial skeleton.  Abdomen: Soft nontender  Neuro: Cranial nerves II through XII are intact, neurovascularly intact in all extremities with 2+ DTRs and 2+ pulses.  Lymph: No lymphadenopathy of posterior or anterior cervical chain or axillae bilaterally.  Gait normal with good balance and coordination.  MSK:  Non tender with full range of motion and good stability and symmetric strength and tone of shoulders, elbows, wrist, hip, knee bilaterally. Arthritic changes of multiple joints Ankle:right No visible erythema or swelling. Range of motion is full in all directions. Strength is 5/5 in all directions. Stable lateral and medial ligaments; squeeze test and kleiger test unremarkable; Talar dome nontender; No pain at base of 5th MT; No tenderness over cuboid; Nontender over the navicular bone but still mild pain over the posterior tibialis tendon in the tarsal tunnel. No sign of peroneal tendon subluxations or tenderness to palpation Negative tarsal tunnel tinel's Able to walk 4 steps. Contralateral ankle unremarkable  MSK US performed of: right This study was ordered, performed, and interpreted by Charlann Boxer D.O.  Foot/Ankle:   All structures visualized.   Talar dome unremarkable  Ankle mortise without effusion. Peroneus longus and brevis tendons unremarkable on long and transverse views without sheath effusions. Posterior tibialis tendon no longer has any hypoechoic changes in the sheath. I navicular bone shows no significant changes at this time. She no does have split seems to be erythema and inflammation of the posterior tibialis nerve sheath Achilles tendon visualized along length of tendon and unremarkable on long and transverse views without sheath effusion. Anterior Talofibular Ligament and Calcaneofibular Ligaments unremarkable and intact. Deltoid Ligament unremarkable and intact. Plantar fascia intact and without effusion, normal thickness. No increased  doppler signal, cap sign, or thickening of tibial cortex. Images saved in hard drive  IMPRESSION:  Healing posterior tibialis tear and navicular bone, patient does have what appears to be possible tarsal tunnel syndrome.  Procedure note 45809; 15 minutes spent for Therapeutic exercises as stated in above notes.  This included exercises focusing on stretching, strengthening, with significant focus on eccentric aspects. Basic range of motion exercises to allow proper full motion at ankle Stretching of the lower leg and hamstrings  Theraband exercises for the lower leg - inversion, eversion, dorsiflexion and plantarflexion each to be completed with a theraband Balance exercises to increase proprioception Weight bearing exercises to increase strength and balance Proper technique shown and discussed handout in great detail with ATC.  All questions were discussed and answered.     Impression and Recommendations:     This case required medical decision making of moderate complexity.      Note: This dictation was prepared with Dragon dictation along with smaller phrase technology. Any transcriptional errors that result from  this process are unintentional.

## 2016-03-17 NOTE — Assessment & Plan Note (Signed)
Healing at this time. Patient does have some underlying arthritic changes that can be contribute in. Patient's fibromyalgia will cause this to slowly heal. I do think that both she is doing very well. Encourage patient to continue to be active and do the exercises and icing. Discussed proper shoes. Patient will follow-up and see me again in 3-4 weeks.

## 2016-03-17 NOTE — Assessment & Plan Note (Signed)
Patient does have what appears to be more of a tarsal tunnel syndrome. We discussed icing regimen, home exercises, we discussed which activities are doing which ones to avoid. Patient was started on gabapentin to see if this will help out with some of the discomfort. Patient does have a history of diabetes and we will need to watch for any peripheral neuropathy. Patient myalgia may also benefit from the medication. Patient follow-up in 3-4 weeks to make sure she is improving.

## 2016-03-17 NOTE — Progress Notes (Signed)
Pre visit review using our clinic review tool, if applicable. No additional management support is needed unless otherwise documented below in the visit note. 

## 2016-03-17 NOTE — Patient Instructions (Signed)
GABAPENTIN 100MG NIGHTLY FOR FIRST WEEK THEN 200MG THEREAFTER IF NOT MAKING YOU TIRED IN AM.  Stay active but no Golf for at least another 10 days.  Ice after a lot of activity  Turmeric 510m 1-2 times daily can help the fibromyalgia See me again in 4-6 weeks to make sure nearly healed.

## 2016-04-03 ENCOUNTER — Other Ambulatory Visit: Payer: Self-pay | Admitting: Family Medicine

## 2016-04-06 ENCOUNTER — Other Ambulatory Visit: Payer: Self-pay | Admitting: Family Medicine

## 2016-04-11 ENCOUNTER — Other Ambulatory Visit: Payer: Self-pay

## 2016-04-11 DIAGNOSIS — Z1231 Encounter for screening mammogram for malignant neoplasm of breast: Secondary | ICD-10-CM

## 2016-04-12 ENCOUNTER — Encounter: Payer: Self-pay | Admitting: Family Medicine

## 2016-04-12 ENCOUNTER — Other Ambulatory Visit: Payer: Self-pay

## 2016-04-12 ENCOUNTER — Ambulatory Visit (INDEPENDENT_AMBULATORY_CARE_PROVIDER_SITE_OTHER): Payer: Medicare Other | Admitting: Family Medicine

## 2016-04-12 VITALS — BP 138/82 | HR 84

## 2016-04-12 DIAGNOSIS — M129 Arthropathy, unspecified: Secondary | ICD-10-CM

## 2016-04-12 DIAGNOSIS — G5751 Tarsal tunnel syndrome, right lower limb: Secondary | ICD-10-CM | POA: Diagnosis not present

## 2016-04-12 DIAGNOSIS — M19079 Primary osteoarthritis, unspecified ankle and foot: Secondary | ICD-10-CM | POA: Insufficient documentation

## 2016-04-12 NOTE — Progress Notes (Addendum)
[facility] [address] [place], [place] [address] Phone: [phone] Subjective:    I'm seeing this patient by the request  of:  Eulas Post, MD   CC: right ankle pain f/u   [id number] Dana Dyer is a 65 y.o. female coming in with complaint of right ankle pain. Patient sent have a posterior tibialis tendon tear with small avulsion of the navicular bone. Patient had been doing very well. States that the ankle was not giving her any pain. Having more of the foot pain. Seems a little more distal than what it was previously. States going up hill seems to make it difficult. Denies any numbness. Overall seems to be improving a little bit. All nevus new problem.     Past Medical History  Diagnosis Date  . DIABETES MELLITUS, UNCONTROLLED 03/16/2009  . OBSTRUCTIVE SLEEP APNEA 10/09/2007  . HYPERTENSION 10/09/2007  . ALLERGIC RHINITIS 10/09/2007  . FIBROMYALGIA 10/09/2007  . FATIGUE 10/21/2008  . TRANSAMINASES, SERUM, ELEVATED 07/08/2009  . Morton's neuroma   . Plantar fasciitis     History - right  . Arthritis     hips, spine  . GERD (gastroesophageal reflux disease)     diet  controlled, no meds  . Sleep apnea     Uses CPAP  . HYPERLIPIDEMIA 10/09/2007    diet controlled, no meds  . H/O vaginal hysterectomy    Past Surgical History  Procedure Laterality Date  . Tonsillectomy  1959  . Mamoplasty      reduction  . Basal cell carcinoma excision    . Abdominal hysterectomy  1979    prolapsed uterus  . Tubal ligation  1976  . Colonoscopy  2006    [place] History   Social History  . Marital Status: Married    Spouse Name: N/A  . Number of Children: N/A  . Years of Education: N/A   Social History Main Topics  . Smoking status: Never Smoker   . Smokeless tobacco: Never Used  . Alcohol Use: No  . Drug Use: No  . Sexual Activity: Yes    Birth Control/ Protection: Other-see comments, Post-menopausal     Comment:  Hysterectomy   Other Topics Concern  . None   Social History Narrative   Allergies  Allergen Reactions  . Amoxicillin-Pot Clavulanate     Severe diarrhea  . Codeine Sulfate     REACTION: "hyper"  . Crestor [Rosuvastatin Calcium] Other (See Comments)    myalgia  . Erythromycin Base   . Invokana [Canagliflozin] Other (See Comments)    Recurrent yeast infections  . Irbesartan-Hydrochlorothiazide   . Latex   . Victoza [Liraglutide] Nausea Only   Family History  Problem Relation Age of Onset  . Diabetes Mother   . Arthritis Mother   . Cancer Mother     breast  . Alcohol abuse Father   . Arthritis Father   . Hypertension Maternal Grandfather   . Miscarriages / Stillbirths Paternal Grandfather   . Colon polyps Neg Hx   . Colon cancer Neg Hx   . Esophageal cancer Neg Hx   . Rectal cancer Neg Hx   . Stomach cancer Neg Hx     Past medical history, social, surgical and family history all reviewed in electronic medical record.  No pertanent information unless stated regarding to the chief complaint.   Review of Systems: No headache, visual changes, nausea, vomiting, diarrhea, constipation, dizziness, abdominal pain, skin rash, fevers, chills,  night sweats, weight loss, swollen lymph nodes, body aches, joint swelling, muscle aches, chest pain, shortness of breath, mood changes.   Objective Blood pressure 138/82, pulse 84.  General: No apparent distress alert and oriented x3 mood and affect normal, dressed appropriately.  HEENT: Pupils equal, extraocular movements intact  Respiratory: Patient's speak in full sentences and does not appear short of breath  Cardiovascular: No lower extremity edema, non tender, no erythema  Skin: Warm dry intact with no signs of infection or rash on extremities or on axial skeleton.  Abdomen: Soft nontender  Neuro: Cranial nerves II through XII are intact, neurovascularly intact in all extremities with 2+ DTRs and 2+ pulses.  Lymph: No  lymphadenopathy of posterior or anterior cervical chain or axillae bilaterally.  Gait normal with good balance and coordination.  MSK:  Non tender with full range of motion and good stability and symmetric strength and tone of shoulders, elbows, wrist, hip, knee bilaterally. Arthritic changes of multiple joints Ankle:right No visible erythema or swelling. Range of motion is full in all directions. Strength is 5/5 in all directions. Stable lateral and medial ligaments; squeeze test and kleiger test unremarkable; Talar dome nontender; No pain at base of 5th MT; No tenderness over cuboid; Nontender over the navicular bone itself but does have pain over the navicular metatarsal joint which is new. Mild swelling in this area as well. No sign of peroneal tendon subluxations or tenderness to palpation Negative tarsal tunnel tinel's Able to walk 4 steps. Contralateral ankle unremarkable   Procedure: Real-time Ultrasound Guided Injection of navicular metatarsal joint Device: GE Logiq E  Ultrasound guided injection is preferred based studies that show increased duration, increased effect, greater accuracy, decreased procedural pain, increased response rate, and decreased cost with ultrasound guided versus blind injection.  Verbal informed consent obtained.  Time-out conducted.  Noted no overlying erythema, induration, or other signs of local infection.  Skin prepped in a sterile fashion.  Local anesthesia: Topical Ethyl chloride.  With sterile technique and under real time ultrasound guidance:  With a 25-gauge 1 inch needle patient was injected with a total of 0.5 mL of 0.5% Marcaine and 0.5 mL of Kenalog 40 mg/dL Completed without difficulty  Pain immediately resolved suggesting accurate placement of the medication.  Advised to call if fevers/chills, erythema, induration, drainage, or persistent bleeding.  Images permanently stored and available for review in the ultrasound unit.  Impression:  Technically successful ultrasound guided injection.     Impression and Recommendations:     This case required medical decision making of moderate complexity.      Note: This dictation was prepared with Dragon dictation along with smaller phrase technology. Any transcriptional errors that result from this process are unintentional.

## 2016-04-12 NOTE — Assessment & Plan Note (Signed)
Patient given injection today and tolerated the procedure well. We discussed icing regimen and home exercises. We discussed which activities to do in which ones to avoid. Discussed with patient that this is going to be a chronic problem but we will be getting better. Discussed proper shoewear. Patient did not feel that the topical anti-inflammatory was helpful but given another trial. Patient has worsening symptoms possible advance imaging would be warranted unfortunately referral to a podiatrist for surgical intervention.  Spent  25 minutes with patient face-to-face and had greater than 50% of counseling including as described above in assessment and plan.

## 2016-04-12 NOTE — Assessment & Plan Note (Signed)
Seems improved at this time. No signs of any numbness.

## 2016-04-12 NOTE — Patient Instructions (Addendum)
Good to see you  Have a great golf trip I do think there is some arthritis as well.  Continue to wear good shoes.  I hope this helps and can repeat every 3 months Ice after activity  pennsaid pinkie amount topically 2 times daily as needed.  See me again in 4 weeks if not better and we would need to discuss MRI if not better.

## 2016-04-25 ENCOUNTER — Ambulatory Visit: Payer: Medicare Other | Admitting: Family Medicine

## 2016-04-29 ENCOUNTER — Telehealth: Payer: Self-pay | Admitting: Family Medicine

## 2016-04-29 MED ORDER — DULAGLUTIDE 1.5 MG/0.5ML ~~LOC~~ SOAJ: 1.5000 mg | SUBCUTANEOUS | Status: DC

## 2016-04-29 NOTE — Telephone Encounter (Signed)
Pt would like to go to next dose of trulicity. Pt is currently on 0.75 [facility] [address]

## 2016-04-29 NOTE — Telephone Encounter (Signed)
Please advise 

## 2016-04-29 NOTE — Telephone Encounter (Signed)
Spoke with patient reviewed dose change. New Rx sent

## 2016-04-29 NOTE — Telephone Encounter (Signed)
Go ahead and increase Trulicity to 1.5 mg [place] once weekly.

## 2016-05-11 ENCOUNTER — Ambulatory Visit: Payer: Medicare Other | Admitting: Family Medicine

## 2016-05-11 ENCOUNTER — Ambulatory Visit
Admission: RE | Admit: 2016-05-11 | Discharge: 2016-05-11 | Disposition: A | Discharge status: Home / Self Care | Payer: Medicare Other | Source: Ambulatory Visit

## 2016-05-11 ENCOUNTER — Other Ambulatory Visit: Payer: Self-pay | Admitting: Family Medicine

## 2016-05-11 DIAGNOSIS — Z1231 Encounter for screening mammogram for malignant neoplasm of breast: Secondary | ICD-10-CM

## 2016-06-10 ENCOUNTER — Ambulatory Visit (INDEPENDENT_AMBULATORY_CARE_PROVIDER_SITE_OTHER): Payer: Medicare Other | Admitting: Family Medicine

## 2016-06-10 VITALS — BP 130/80 | HR 94 | Temp 97.9°F | Ht 62.5 in | Wt 179.8 lb

## 2016-06-10 DIAGNOSIS — I10 Essential (primary) hypertension: Secondary | ICD-10-CM

## 2016-06-10 DIAGNOSIS — E1165 Type 2 diabetes mellitus with hyperglycemia: Secondary | ICD-10-CM

## 2016-06-10 DIAGNOSIS — Z794 Long term (current) use of insulin: Secondary | ICD-10-CM | POA: Diagnosis not present

## 2016-06-10 DIAGNOSIS — E785 Hyperlipidemia, unspecified: Secondary | ICD-10-CM | POA: Diagnosis not present

## 2016-06-10 DIAGNOSIS — IMO0002 Reserved for concepts with insufficient information to code with codable children: Secondary | ICD-10-CM

## 2016-06-10 DIAGNOSIS — E1142 Type 2 diabetes mellitus with diabetic polyneuropathy: Secondary | ICD-10-CM

## 2016-06-10 LAB — POCT GLYCOSYLATED HEMOGLOBIN (HGB A1C): HEMOGLOBIN A1C: 9.1

## 2016-06-10 NOTE — Progress Notes (Signed)
Subjective:    Patient ID: Dana Dyer, female    DOB: 1951-01-30, 65 y.o.   MRN: [id number]  HPI Medical follow-up  Poorly controlled type 2 diabetes. Last A1c 9.5%. She takes metformin, Lantus, Humalog. Previous intolerance with invokana. We added trulicity and she is currently on dosage of 1.5 mg once weekly. Does have some very mild nausea but overall tolerating well. Lost couple of pounds. Exercising regularly. Rare hypoglycemia. Fasting blood sugars very considerably. She feels that she has been very compliant with diet  Hyperlipidemia. Never started Livalo.  She's had previous intolerance with multiple other statins.  Hypertension treated with losartan HCTZ and amlodipine. No dizziness. No headaches. No chest pains.  Past Medical History  Diagnosis Date  . DIABETES MELLITUS, UNCONTROLLED 03/16/2009  . OBSTRUCTIVE SLEEP APNEA 10/09/2007  . HYPERTENSION 10/09/2007  . ALLERGIC RHINITIS 10/09/2007  . FIBROMYALGIA 10/09/2007  . FATIGUE 10/21/2008  . TRANSAMINASES, SERUM, ELEVATED 07/08/2009  . Morton's neuroma   . Plantar fasciitis     History - right  . Arthritis     hips, spine  . GERD (gastroesophageal reflux disease)     diet  controlled, no meds  . Sleep apnea     Uses CPAP  . HYPERLIPIDEMIA 10/09/2007    diet controlled, no meds  . H/O vaginal hysterectomy    Past Surgical History  Procedure Laterality Date  . Tonsillectomy  1959  . Mamoplasty      reduction  . Basal cell carcinoma excision    . Abdominal hysterectomy  1979    prolapsed uterus  . Tubal ligation  1976  . Colonoscopy  2006    Dana Dyer    reports that she has never smoked. She has never used smokeless tobacco. She reports that she does not drink alcohol or use illicit drugs. family history includes Alcohol abuse in her father; Arthritis in her father and mother; Cancer in her mother; Diabetes in her mother; Hypertension in her maternal grandfather; Miscarriages / [place] in her paternal  grandfather. There is no history of Colon polyps, Colon cancer, Esophageal cancer, Rectal cancer, or Stomach cancer. Allergies  Allergen Reactions  . Amoxicillin-Pot Clavulanate     Severe diarrhea  . Codeine Sulfate     REACTION: "hyper"  . Crestor [Rosuvastatin Calcium] Other (See Comments)    myalgia  . Erythromycin Base   . Invokana [Canagliflozin] Other (See Comments)    Recurrent yeast infections  . Irbesartan-Hydrochlorothiazide   . Latex   . Victoza [Liraglutide] Nausea Only      Review of Systems  Constitutional: Negative for appetite change, fatigue and unexpected weight change.  Eyes: Negative for visual disturbance.  Respiratory: Negative for cough, chest tightness, shortness of breath and wheezing.   Cardiovascular: Negative for chest pain, palpitations and leg swelling.  Gastrointestinal: Negative for vomiting and abdominal pain.  Endocrine: Negative for polydipsia and polyuria.  Neurological: Negative for dizziness, seizures, syncope, weakness, light-headedness and headaches.       Objective:   Physical Exam  Constitutional: She appears well-developed and well-nourished.  Cardiovascular: Normal rate and regular rhythm.   Pulmonary/Chest: Effort normal and breath sounds normal. No respiratory distress. She has no wheezes. She has no rales.  Musculoskeletal: She exhibits no edema.  Neurological: She is alert.          Assessment & Plan:  #1 type 2 diabetes. Poor control. Recheck A1c. Overall, she has been fairly well tolerant of GLP-1 medication. Hopefully she can continue with weight loss  A1c 9.1%. We will recommend that she slowly titrate up Lantus 2 units every 3 days until fasting blood sugars consistently 130 or less and will also give her sliding scale insulin regimen to take 3 times daily with meals  #2 hypertension stable and at goal. Continue current medications  #3 dyslipidemia. We've recommended that she go ahead and start taking Livalo We'll  plan to wait and recheck lipids at follow-up in about 3 months  Dana Post MD [facility]

## 2016-06-10 NOTE — Progress Notes (Signed)
Pre visit review using our clinic review tool, if applicable. No additional management support is needed unless otherwise documented below in the visit note. 

## 2016-06-14 ENCOUNTER — Other Ambulatory Visit: Payer: Self-pay | Admitting: Family Medicine

## 2016-06-16 ENCOUNTER — Encounter: Payer: Self-pay | Admitting: Family Medicine

## 2016-07-13 ENCOUNTER — Other Ambulatory Visit: Payer: Self-pay | Admitting: Family Medicine

## 2016-08-23 NOTE — Progress Notes (Signed)
[name] [address], [place] [address] Phone: [phone] Subjective:    I'm seeing this patient by the request  of:  Eulas Post, MD   CC: right hip pain  [id number]  Dana Dyer is a 65 y.o. female coming in with complaint of right hip pain. This is a new problem for patient. Patient's ankle is feeling much better. Has been walking on a more regular basis. Patient unfortunately is having worsening symptoms at this point. States that it seems to be on the lateral aspect of the hip. Patient states that it is so sore at night it wakes her up. Denies any radiation down the leg or any numbness or tingling. Patient rates the severity of pain though is 8 out of 10. No over-the-counter medications have been beneficial at this time.    Past Medical History:  Diagnosis Date  . ALLERGIC RHINITIS 10/09/2007  . Arthritis    hips, spine  . DIABETES MELLITUS, UNCONTROLLED 03/16/2009  . FATIGUE 10/21/2008  . FIBROMYALGIA 10/09/2007  . GERD (gastroesophageal reflux disease)    diet  controlled, no meds  . H/O vaginal hysterectomy   . HYPERLIPIDEMIA 10/09/2007   diet controlled, no meds  . HYPERTENSION 10/09/2007  . Morton's neuroma   . OBSTRUCTIVE SLEEP APNEA 10/09/2007  . Plantar fasciitis    History - right  . Sleep apnea    Uses CPAP  . TRANSAMINASES, SERUM, ELEVATED 07/08/2009   Past Surgical History:  Procedure Laterality Date  . ABDOMINAL HYSTERECTOMY  1979   prolapsed uterus  . BASAL CELL CARCINOMA EXCISION    . COLONOSCOPY  2006   [facility]  . mamoplasty     reduction  . TONSILLECTOMY  1959  . TUBAL LIGATION  1976   Social History   Social History  . Marital status: Married    Spouse name: N/A  . Number of children: N/A  . Years of education: N/A   Social History Main Topics  . Smoking status: Never Smoker  . Smokeless tobacco: Never Used  . Alcohol use No  . Drug use: No  . Sexual activity: Yes    Birth control/  protection: Other-see comments, Post-menopausal     Comment: Hysterectomy   Other Topics Concern  . None   Social History Narrative  . None   Allergies  Allergen Reactions  . Amoxicillin-Pot Clavulanate     Severe diarrhea  . Codeine Sulfate     REACTION: "hyper"  . Crestor [Rosuvastatin Calcium] Other (See Comments)    myalgia  . Erythromycin Base   . Invokana [Canagliflozin] Other (See Comments)    Recurrent yeast infections  . Irbesartan-Hydrochlorothiazide   . Latex   . Victoza [Liraglutide] Nausea Only   Family History  Problem Relation Age of Onset  . Diabetes Mother   . Arthritis Mother   . Cancer Mother     breast  . Alcohol abuse Father   . Arthritis Father   . Hypertension Maternal Grandfather   . Miscarriages / Stillbirths Paternal Grandfather   . Colon polyps Neg Hx   . Colon cancer Neg Hx   . Esophageal cancer Neg Hx   . Rectal cancer Neg Hx   . Stomach cancer Neg Hx     Past medical history, social, surgical and family history all reviewed in electronic medical record.  No pertanent information unless stated regarding to the chief complaint.   Review of Systems: No headache, visual changes, nausea, vomiting,  diarrhea, constipation, dizziness, abdominal pain, skin rash, fevers, chills, night sweats, weight loss, swollen lymph nodes, body aches, joint swelling, muscle aches, chest pain, shortness of breath, mood changes.   Objective  Blood pressure 128/80, pulse 98, weight 182 lb (82.6 kg), SpO2 97 %.  General: No apparent distress alert and oriented x3 mood and affect normal, dressed appropriately.  HEENT: Pupils equal, extraocular movements intact  Respiratory: Patient's speak in full sentences and does not appear short of breath  Cardiovascular: No lower extremity edema, non tender, no erythema  Skin: Warm dry intact with no signs of infection or rash on extremities or on axial skeleton.  Abdomen: Soft nontender  Neuro: Cranial nerves II through XII  are intact, neurovascularly intact in all extremities with 2+ DTRs and 2+ pulses.  Lymph: No lymphadenopathy of posterior or anterior cervical chain or axillae bilaterally.  Gait normal with good balance and coordination.  MSK:  Non tender with full range of motion and good stability and symmetric strength and tone of shoulders, elbows, wrist, hip, knee bilaterally. Arthritic changes of multiple joints  Right Hip exam shows the patient is severely tender over the greater trochanteric area. Full range of motion including internal and next on range of motion. Mild tightness of Faber test. Negative straight leg test. No pain over the sacroiliac joint. Full strength laterally as well as distally. Contralateral hip unremarkable  MSK US performed of: Right This study was ordered, performed, and interpreted by Charlann Boxer D.O.  Hip: Trochanteric bursa with significant hypoechoic changes and swelling Acetabular labrum visualized and without tears, displacement, or effusion in joint. Femoral neck appears unremarkable without increased power doppler signal along Cortex.  IMPRESSION:  Greater trochanter bursitis   Procedure: Real-time Ultrasound Guided Injection of right greatertrochanteric bursitis secondary to patient's body habitus Device: GE Logiq E  Ultrasound guided injection is preferred based studies that show increased duration, increased effect, greater accuracy, decreased procedural pain, increased response rate, and decreased cost with ultrasound guided versus blind injection.  Verbal informed consent obtained.  Time-out conducted.  Noted no overlying erythema, induration, or other signs of local infection.  Skin prepped in a sterile fashion.  Local anesthesia: Topical Ethyl chloride.  With sterile technique and under real time ultrasound guidance:  Greater trochanteric area was visualized and patient's bursa was noted. A 22-gauge 3 inch needle was inserted and 4 cc of 0.5% Marcaine and 1  cc of Kenalog 40 mg/dL was injected. Pictures taken Completed without difficulty  Pain immediately resolved suggesting accurate placement of the medication.  Advised to call if fevers/chills, erythema, induration, drainage, or persistent bleeding.  Images permanently stored and available for review in the ultrasound unit.  Impression: Technically successful ultrasound guided injection.    Impression and Recommendations:     This case required medical decision making of moderate complexity.      Note: This dictation was prepared with Dragon dictation along with smaller phrase technology. Any transcriptional errors that result from this process are unintentional.

## 2016-08-24 ENCOUNTER — Ambulatory Visit (INDEPENDENT_AMBULATORY_CARE_PROVIDER_SITE_OTHER): Payer: Medicare Other | Admitting: Family Medicine

## 2016-08-24 ENCOUNTER — Encounter: Payer: Self-pay | Admitting: Family Medicine

## 2016-08-24 DIAGNOSIS — M7061 Trochanteric bursitis, right hip: Secondary | ICD-10-CM | POA: Diagnosis not present

## 2016-08-24 NOTE — Assessment & Plan Note (Signed)
Patient was injected today and tolerated the procedure well. Encourage her to monitor her blood sugars for 3 days. We discussed icing regimen. Given topical anti-inflammatories a try. Home exercises given as well. Patient will follow-up with me again in 3-4 weeks for further evaluation and treatment. If worsening symptoms we'll consider further evaluation of back pain.

## 2016-08-24 NOTE — Patient Instructions (Signed)
Great to see you  Bursitis of your hip Injected today  pennsaid pinkie amount topically 2 times daily as needed.  Exercises 3 times a week.  Good shoes if you are walking. See me again in 4 weeks if you need me.

## 2016-09-18 NOTE — Progress Notes (Signed)
[name] [address], [place] [address] Phone: [phone] Subjective:     CC: right hip pain f/u  [id number]  HILLARIE HARRIGAN is a 65 y.o. female coming in with complaint of right hip pain. Found to have greater trochanteric bursitis. Given an injection, home exercises, which activities doing which ones to avoid. Patient states Hip pain is only approximately 20% better. States wrap for one week she is doing about 30% better but then unfortunately has started to come back. States that she has significant tightness after sitting for greater than 5 minutes. States that sometimes her legs especially going up stairs do not feel like they can be strong enough to hold her.  Patient also had a posterior tibialis tendon and tarsal tunnel syndrome on the right side. Patient was doing significantly better at follow-up. Patient states seems to be doing well though.    Past Medical History:  Diagnosis Date  . ALLERGIC RHINITIS 10/09/2007  . Arthritis    hips, spine  . DIABETES MELLITUS, UNCONTROLLED 03/16/2009  . FATIGUE 10/21/2008  . FIBROMYALGIA 10/09/2007  . GERD (gastroesophageal reflux disease)    diet  controlled, no meds  . H/O vaginal hysterectomy   . HYPERLIPIDEMIA 10/09/2007   diet controlled, no meds  . HYPERTENSION 10/09/2007  . Morton's neuroma   . OBSTRUCTIVE SLEEP APNEA 10/09/2007  . Plantar fasciitis    History - right  . Sleep apnea    Uses CPAP  . TRANSAMINASES, SERUM, ELEVATED 07/08/2009   Past Surgical History:  Procedure Laterality Date  . ABDOMINAL HYSTERECTOMY  1979   prolapsed uterus  . BASAL CELL CARCINOMA EXCISION    . COLONOSCOPY  2006   [facility]  . mamoplasty     reduction  . TONSILLECTOMY  1959  . TUBAL LIGATION  1976   Social History   Social History  . Marital status: Married    Spouse name: N/A  . Number of children: N/A  . Years of education: N/A   Social History Main Topics  . Smoking status: Never  Smoker  . Smokeless tobacco: Never Used  . Alcohol use No  . Drug use: No  . Sexual activity: Yes    Birth control/ protection: Other-see comments, Post-menopausal     Comment: Hysterectomy   Other Topics Concern  . None   Social History Narrative  . None   Allergies  Allergen Reactions  . Amoxicillin-Pot Clavulanate     Severe diarrhea  . Codeine Sulfate     REACTION: "hyper"  . Crestor [Rosuvastatin Calcium] Other (See Comments)    myalgia  . Erythromycin Base   . Invokana [Canagliflozin] Other (See Comments)    Recurrent yeast infections  . Irbesartan-Hydrochlorothiazide   . Latex   . Victoza [Liraglutide] Nausea Only   Family History  Problem Relation Age of Onset  . Diabetes Mother   . Arthritis Mother   . Cancer Mother     breast  . Alcohol abuse Father   . Arthritis Father   . Hypertension Maternal Grandfather   . Miscarriages / Stillbirths Paternal Grandfather   . Colon polyps Neg Hx   . Colon cancer Neg Hx   . Esophageal cancer Neg Hx   . Rectal cancer Neg Hx   . Stomach cancer Neg Hx     Past medical history, social, surgical and family history all reviewed in electronic medical record.  No pertanent information unless stated regarding to the chief complaint.  Review of Systems: No headache, visual changes, nausea, vomiting, diarrhea, constipation, dizziness, abdominal pain, skin rash, fevers, chills, night sweats, weight loss, swollen lymph nodes, body aches, joint swelling, muscle aches, chest pain, shortness of breath, mood changes.   Objective  Blood pressure 132/80, pulse 93, weight 184 lb (83.5 kg), SpO2 97 %.  General: No apparent distress alert and oriented x3 mood and affect normal, dressed appropriately.  HEENT: Pupils equal, extraocular movements intact  Respiratory: Patient's speak in full sentences and does not appear short of breath  Cardiovascular: No lower extremity edema, non tender, no erythema  Skin: Warm dry intact with no signs  of infection or rash on extremities or on axial skeleton.  Abdomen: Soft nontender  Neuro: Cranial nerves II through XII are intact, neurovascularly intact in all extremities with 2+ DTRs and 2+ pulses.  Lymph: No lymphadenopathy of posterior or anterior cervical chain or axillae bilaterally.  Gait normal with good balance and coordination.  MSK:  Non tender with full range of motion and good stability and symmetric strength and tone of shoulders, elbows, wrist, hip, knee bilaterally. Arthritic changes of multiple joints  Back exam shows the patient does have some tightness and decreased range of motion and 5 in all planes. Worsening pain with extension of the back. Negative straight leg test. Positive piriformis signs with Corky Sox test. Neurovascular intact distally. 4 out of 5 strength but seems to be symmetric.     Impression and Recommendations:     This case required medical decision making of moderate complexity.      Note: This dictation was prepared with Dragon dictation along with smaller phrase technology. Any transcriptional errors that result from this process are unintentional.

## 2016-09-19 ENCOUNTER — Ambulatory Visit (INDEPENDENT_AMBULATORY_CARE_PROVIDER_SITE_OTHER): Payer: Medicare Other | Admitting: Family Medicine

## 2016-09-19 ENCOUNTER — Encounter: Payer: Self-pay | Admitting: Family Medicine

## 2016-09-19 VITALS — BP 132/80 | HR 93 | Wt 184.0 lb

## 2016-09-19 DIAGNOSIS — M5136 Other intervertebral disc degeneration, lumbar region: Secondary | ICD-10-CM | POA: Diagnosis not present

## 2016-09-19 DIAGNOSIS — M48061 Spinal stenosis, lumbar region without neurogenic claudication: Secondary | ICD-10-CM | POA: Diagnosis not present

## 2016-09-19 DIAGNOSIS — Z23 Encounter for immunization: Secondary | ICD-10-CM

## 2016-09-19 DIAGNOSIS — M4802 Spinal stenosis, cervical region: Secondary | ICD-10-CM

## 2016-09-19 MED ORDER — GABAPENTIN 100 MG PO CAPS: 200.0000 mg | ORAL_CAPSULE | Freq: Every day | ORAL | 3 refills | Status: DC

## 2016-09-19 MED ORDER — MELOXICAM 15 MG PO TABS: 15.0000 mg | ORAL_TABLET | Freq: Every day | ORAL | 6 refills | Status: DC

## 2016-09-19 NOTE — Patient Instructions (Addendum)
Good to see you  Ice is your friend  Jacklynn Ganong PT will be calling you  Gabapentin 174m at night Meloxicam daily for 1 month then as needed.  See me again in 4 weeks and we will make sure you are doing well.

## 2016-09-19 NOTE — Assessment & Plan Note (Signed)
I believe the patient actually does have more of a degenerative disc disease with lumbar spinal stenosis. Patient's has responded to oral anti-inflammatories and given a prescription. Patient also given gabapentin and work at night. Sent to formal physical therapy. Depending on findings we'll see how patient does but if worsening symptoms we'll need an imaging. Patient knows if any numbness or weakness occurs down the legs to seek medical attention medially.

## 2016-09-21 ENCOUNTER — Ambulatory Visit: Payer: Medicare Other | Attending: Family Medicine

## 2016-09-21 ENCOUNTER — Other Ambulatory Visit (INDEPENDENT_AMBULATORY_CARE_PROVIDER_SITE_OTHER): Payer: Medicare Other

## 2016-09-21 DIAGNOSIS — M25652 Stiffness of left hip, not elsewhere classified: Secondary | ICD-10-CM

## 2016-09-21 DIAGNOSIS — M25551 Pain in right hip: Secondary | ICD-10-CM

## 2016-09-21 DIAGNOSIS — Z0000 Encounter for general adult medical examination without abnormal findings: Secondary | ICD-10-CM

## 2016-09-21 DIAGNOSIS — M25651 Stiffness of right hip, not elsewhere classified: Secondary | ICD-10-CM

## 2016-09-21 DIAGNOSIS — M545 Low back pain, unspecified: Secondary | ICD-10-CM

## 2016-09-21 DIAGNOSIS — G8929 Other chronic pain: Secondary | ICD-10-CM | POA: Diagnosis present

## 2016-09-21 LAB — BASIC METABOLIC PANEL
BUN: 12 mg/dL (ref 6–23)
CHLORIDE: 95 meq/L — AB (ref 96–112)
CO2: 30 mEq/L (ref 19–32)
Calcium: 9.2 mg/dL (ref 8.4–10.5)
Creatinine, Ser: 0.59 mg/dL (ref 0.40–1.20)
GFR: 108.03 mL/min (ref >60.00)
GLUCOSE: 103 mg/dL — AB (ref 70–99)
POTASSIUM: 3.6 meq/L (ref 3.5–5.1)
Sodium: 133 mEq/L — ABNORMAL LOW (ref 135–145)

## 2016-09-21 LAB — CBC WITH DIFFERENTIAL/PLATELET
BASOS PCT: 0.2 % (ref 0.0–3.0)
Basophils Absolute: 0 10*3/uL (ref 0.0–0.1)
EOS PCT: 1.6 % (ref 0.0–5.0)
Eosinophils Absolute: 0.1 10*3/uL (ref 0.0–0.7)
HEMATOCRIT: 36.4 % (ref 36.0–46.0)
HEMOGLOBIN: 12.3 g/dL (ref 12.0–15.0)
Lymphocytes Relative: 32.4 % (ref 12.0–46.0)
Lymphs Abs: 1.7 10*3/uL (ref 0.7–4.0)
MCHC: 33.9 g/dL (ref 30.0–36.0)
MCV: 85 fl (ref 78.0–100.0)
MONO ABS: 0.6 10*3/uL (ref 0.1–1.0)
Monocytes Relative: 10.4 % (ref 3.0–12.0)
Neutro Abs: 2.9 10*3/uL (ref 1.4–7.7)
Neutrophils Relative %: 55.4 % (ref 43.0–77.0)
Platelets: 166 10*3/uL (ref 150.0–400.0)
RBC: 4.28 Mil/uL (ref 3.87–5.11)
RDW: 13.7 % (ref 11.5–15.5)
WBC: 5.3 10*3/uL (ref 4.0–10.5)

## 2016-09-21 LAB — HEPATIC FUNCTION PANEL
ALBUMIN: 4 g/dL (ref 3.5–5.2)
ALT: 65 U/L — AB (ref 0–35)
AST: 51 U/L — ABNORMAL HIGH (ref 0–37)
Alkaline Phosphatase: 108 U/L (ref 39–117)
BILIRUBIN TOTAL: 0.9 mg/dL (ref 0.2–1.2)
Bilirubin, Direct: 0.2 mg/dL (ref 0.0–0.3)
TOTAL PROTEIN: 7.2 g/dL (ref 6.0–8.3)

## 2016-09-21 LAB — MICROALBUMIN / CREATININE URINE RATIO
Creatinine,U: 83.9 mg/dL
Microalb Creat Ratio: 0.8 mg/g (ref 0.0–30.0)
Microalb, Ur: <0.7 mg/dL (ref 0.0–1.9)

## 2016-09-21 LAB — LIPID PANEL
CHOL/HDL RATIO: 3
CHOLESTEROL: 258 mg/dL — AB (ref 0–200)
HDL: 77.7 mg/dL (ref >39.00)
LDL CALC: 162 mg/dL — AB (ref 0–99)
NonHDL: 180.47
TRIGLYCERIDES: 92 mg/dL (ref 0.0–149.0)
VLDL: 18.4 mg/dL (ref 0.0–40.0)

## 2016-09-21 LAB — TSH: TSH: 3.01 u[IU]/mL (ref 0.35–4.50)

## 2016-09-21 LAB — HEMOGLOBIN A1C: Hgb A1c MFr Bld: 8.5 % — ABNORMAL HIGH (ref 4.6–6.5)

## 2016-09-21 NOTE — Patient Instructions (Addendum)
Do these many times each day.  Hold 20 seconds hold 2-3 times each.    Hip Stretch  Put right ankle over left knee. Let right knee fall downward, but keep ankle in place. Feel the stretch in hip. May push down gently with hand to feel stretch. Hold ____ seconds while counting out loud. Repeat with other leg. Repeat ____ times. Do ____ sessions per day.    Stretching: Piriformis (Supine)  Pull right knee toward opposite shoulder. Hold ____ seconds. Relax. Repeat ____ times per set. Do ____ sets per session. Do ____ sessions per day.   Lower Trunk Rotation Stretch    Keeping back flat and feet together, rotate knees to left side. Hold ____ seconds. Repeat ____ times per set. Do ____ sets per session. Do ____ sessions per day.  http://orth.exer.us/123   Copyright  VHI. All rights reserved.   HIP: Hamstrings - Short Sitting    Rest leg on raised surface. Keep knee straight. Lift chest. Hold ___ seconds. ___ reps per set, ___ sets per day, ___ days per week  Copyright  VHI. All rights reserved.   [facility] [address], [place], [place] [address] Phone # [phone] Fax [phone]

## 2016-09-21 NOTE — Therapy (Signed)
[facility] [address], [place], [place], [address] Phone: [phone]   Fax:  [phone]  Physical Therapy Treatment  Patient Details  Name: Dana Dyer MRN: [id number] Date of Birth: [birth year] Referring Provider: Hulan Saas, MD  Encounter Date: 09/21/2016      PT End of Session - 09/21/16 1319    Visit Number 1   Number of Visits 10   Date for PT Re-Evaluation 11/16/16   PT Start Time 6378   PT Stop Time 1312   PT Time Calculation (min) 37 min   Activity Tolerance Patient tolerated treatment well   Behavior During Therapy [facility] for tasks assessed/performed      Past Medical History:  Diagnosis Date  . ALLERGIC RHINITIS 10/09/2007  . Arthritis    hips, spine  . DIABETES MELLITUS, UNCONTROLLED 03/16/2009  . FATIGUE 10/21/2008  . FIBROMYALGIA 10/09/2007  . GERD (gastroesophageal reflux disease)    diet  controlled, no meds  . H/O vaginal hysterectomy   . HYPERLIPIDEMIA 10/09/2007   diet controlled, no meds  . HYPERTENSION 10/09/2007  . Morton's neuroma   . OBSTRUCTIVE SLEEP APNEA 10/09/2007  . Plantar fasciitis    History - right  . Sleep apnea    Uses CPAP  . TRANSAMINASES, SERUM, ELEVATED 07/08/2009    Past Surgical History:  Procedure Laterality Date  . ABDOMINAL HYSTERECTOMY  1979   prolapsed uterus  . BASAL CELL CARCINOMA EXCISION    . COLONOSCOPY  2006   [facility]  . mamoplasty     reduction  . TONSILLECTOMY  1959  . TUBAL LIGATION  1976    There were no vitals filed for this visit.      Subjective Assessment - 09/21/16 1236    Subjective Pt presents to PT with complaints of chronic LBP with Rt hip pain.  Pt had injection into Rt bursa and this has helped.  Pt started taking Mobic for inflammation and this has helped.       Pertinent History injection into Rt trochanteric bursa, fibromyalgia   Limitations Sitting;Walking   How long can you sit comfortably? in the car is limited    How long can you walk comfortably? climbing steps and incline is limited due to pain.  Able to walk a mile today and had pain afterwards.   Diagnostic tests [place] of Rt hip  in MD office: Rt hip bursitis   Patient Stated Goals ascend/descend steps or incline with less pain, play golf with less pain, improve flexiblity and core strength   Currently in Pain? Yes   Pain Score 2    Pain Location Hip   Pain Orientation Right   Pain Descriptors / Indicators Aching;Burning;Sore;Tightness   Pain Type Chronic pain   Pain Onset More than a month ago   Pain Frequency Constant   Aggravating Factors  ascending incline or steps, sitting too long, after playing golf   Pain Relieving Factors heat, medication            [facility] PT Assessment - 09/21/16 0001      Assessment   Medical Diagnosis spinal stenosis of lumbar region, unspecified whenther neurogenic claudication present   Referring Provider Hulan Saas, MD   Onset Date/Surgical Date 06/21/16   Next MD Visit 10/2016   Prior Therapy none     Precautions   Precautions None     Restrictions   Weight Bearing Restrictions No     Balance Screen   Has the patient  fallen in the past 6 months No   Has the patient had a decrease in activity level because of a fear of falling?  No   Is the patient reluctant to leave their home because of a fear of falling?  No     Home Environment   Living Environment Private residence   Type of [place] to enter   Entrance Stairs-Number of Steps 3   [place] to live on main level with bedroom/bathroom;Two level     Prior Function   Level of Independence Independent   Vocation Retired   Leisure walking, Building control surveyor   Overall Cognitive Status Within Functional Limits for tasks assessed     Observation/Other Assessments   Focus on Therapeutic Outcomes (FOTO)  51% limitation     ROM / Strength   AROM / PROM / Strength AROM;Strength;PROM     AROM   Overall  AROM  Deficits   Overall AROM Comments Rt hip limited by 30%, Lt hip limited by 20%.  Lumbar AROM is full except Rt sidebending limited by 25%      PROM   Overall PROM  Deficits   Overall PROM Comments see above AROM     Strength   Overall Strength Within functional limits for tasks performed   Overall Strength Comments 4+/5 bil. LE strength     Palpation   Palpation comment pt with tension and stiffness in bil. gluteals and Rt>Lt lumbar paraspinals.  Reduced PA mobility in the thoracic and lumbar spine     Special Tests    Special Tests Lumbar   Lumbar Tests Slump Test;FABER test     FABER test   findings Negative   Side Right     Slump test   Findings Negative   Side Right     Ambulation/Gait   Ambulation/Gait Yes   Ambulation Distance (Feet) 100 Feet   Gait Pattern Within Functional Limits                             PT Education - 09/21/16 1306    Education provided Yes   Education Details hip and lumbar flexibility   Person(s) Educated Patient   Methods Explanation;Demonstration;Handout   Comprehension Verbalized understanding;Returned demonstration          PT Short Term Goals - 09/21/16 1315      PT SHORT TERM GOAL #1   Title be independent in initial HEP   Time 4   Period Weeks   Status New     PT SHORT TERM GOAL #2   Title walk dog for exercise with < or = to 3/10 Rt hip pain   Time 4   Period Weeks   Status New     PT SHORT TERM GOAL #3   Title report a 30% reduction in Rt hip pain with ascending steps or inclines   Time 4   Period Weeks   Status New           PT Long Term Goals - 09/21/16 1230      PT LONG TERM GOAL #1   Title be independent in advanced HEP   Time 8   Period Weeks   Status New     PT LONG TERM GOAL #2   Title reduce FOTO to < or = to 37% limitation   Time 8   Period Weeks  Status New     PT LONG TERM GOAL #3   Title report a 60% reduction in Rt hip pain with ascending steps or  inclines   Time 8   Period Weeks   Status New     PT LONG TERM GOAL #4   Title walk dog for exercise without increased Rt hip pain   Time 8   Period Weeks   Status New     PT LONG TERM GOAL #5   Title demonstrate and verbalize correct body mechanics for lumbar protection   Time 8   Period Weeks   Status New               Plan - 09-23-16 1307    Clinical Impression Statement Pt presents to PT with complaints of Rt hip pain and lumbar/hip stiffness that has been present ~3 months without cause.  Pt had steroid injection into Rt hip and started taking Mobic which has helped.  Pt demonstrates bil. hip stiffness, limited Rt lumbar sidebending and pain with ascending steps and inclines.  Pt will benefit from skilled PT for hip flexibility, strength, core stability and manual/modalities.     Rehab Potential Good   PT Frequency 2x / week   PT Duration 8 weeks   PT Treatment/Interventions ADLs/Self Care Home Management;Cryotherapy;Electrical Stimulation;Functional mobility training;Stair training;Ultrasound;Moist Heat;Therapeutic activities;Traction;Therapeutic exercise;Neuromuscular re-education;Patient/family education;Passive range of motion;Manual techniques;Dry needling;Taping   PT Next Visit Plan Body mechanics education, show pt core strength on the ball (she wants to buy one), flexibility, core strength   Consulted and Agree with Plan of Care Patient      Patient will benefit from skilled therapeutic intervention in order to improve the following deficits and impairments:  Decreased range of motion, Pain, Decreased mobility, Impaired flexibility, Improper body mechanics, Decreased activity tolerance, Decreased endurance  Visit Diagnosis: Pain in right hip - Plan: PT plan of care cert/re-cert  Chronic bilateral low back pain without sciatica - Plan: PT plan of care cert/re-cert  Stiffness of left hip, not elsewhere classified - Plan: PT plan of care cert/re-cert  Stiffness  of right hip, not elsewhere classified - Plan: PT plan of care cert/re-cert       G-Codes - Sep 23, 2016 1231-01-31    Functional Assessment Tool Used FOTO: 51% limitation   Functional Limitation Other PT primary   Other PT Primary Current Status (V6979) At least 40 percent but less than 60 percent impaired, limited or restricted   Other PT Primary Goal Status (Y8016) At least 20 percent but less than 40 percent impaired, limited or restricted      Problem List Patient Active Problem List   Diagnosis Date Noted  . Degenerative disc disease, lumbar 09/19/2016  . Arthritis, midfoot 04/12/2016  . Tarsal tunnel syndrome of right side 03/17/2016  . Tibialis posterior tendon tear, nontraumatic 02/25/2016  . Biceps tendinitis on left 07/14/2015  . Tremor of left hand 07/14/2015  . Mild carpal tunnel syndrome of left wrist 06/23/2014  . Greater trochanteric bursitis of right hip 06/23/2014  . Obesity (BMI 30-39.9) 09/05/2013  . Asthmatic bronchitis 08/12/2013  . TRANSAMINASES, SERUM, ELEVATED 07/08/2009  . Diabetes type 2, uncontrolled ([place]) 03/16/2009  . FATIGUE 10/21/2008  . Hyperlipemia 10/09/2007  . Obstructive sleep apnea 10/09/2007  . Essential hypertension 10/09/2007  . ALLERGIC RHINITIS 10/09/2007  . FIBROMYALGIA 10/09/2007    Sigurd Sos, PT Sep 23, 2016 1:20 PM  [facility] [address] [place], [place], [address] Phone: [phone]  Fax:  [phone]  Name: Dana Dyer MRN: [id number] Date of Birth: [birth year]

## 2016-09-23 ENCOUNTER — Ambulatory Visit: Payer: Medicare Other | Admitting: Physical Therapy

## 2016-09-23 ENCOUNTER — Encounter: Payer: Self-pay | Admitting: Physical Therapy

## 2016-09-23 DIAGNOSIS — M25551 Pain in right hip: Secondary | ICD-10-CM | POA: Diagnosis not present

## 2016-09-23 DIAGNOSIS — M545 Low back pain: Secondary | ICD-10-CM

## 2016-09-23 DIAGNOSIS — G8929 Other chronic pain: Secondary | ICD-10-CM

## 2016-09-23 DIAGNOSIS — M25651 Stiffness of right hip, not elsewhere classified: Secondary | ICD-10-CM

## 2016-09-23 DIAGNOSIS — M25652 Stiffness of left hip, not elsewhere classified: Secondary | ICD-10-CM

## 2016-09-23 NOTE — Therapy (Signed)
[facility] [address] [place], [place], [address] Phone: [phone]   Fax:  [phone]  Physical Therapy Treatment  Patient Details  Name: Dana Dyer MRN: [id number] Date of Birth: Apr 16, 1951 Referring Provider: Hulan Saas, MD  Encounter Date: 09/23/2016      PT End of Session - 09/23/16 0856    Visit Number 2   Number of Visits 10   Date for PT Re-Evaluation 11/16/16   PT Start Time 0852  Pt late   PT Stop Time 0925   PT Time Calculation (min) 33 min   Behavior During Therapy [facility] for tasks assessed/performed      Past Medical History:  Diagnosis Date  . ALLERGIC RHINITIS 10/09/2007  . Arthritis    hips, spine  . DIABETES MELLITUS, UNCONTROLLED 03/16/2009  . FATIGUE 10/21/2008  . FIBROMYALGIA 10/09/2007  . GERD (gastroesophageal reflux disease)    diet  controlled, no meds  . H/O vaginal hysterectomy   . HYPERLIPIDEMIA 10/09/2007   diet controlled, no meds  . HYPERTENSION 10/09/2007  . Morton's neuroma   . OBSTRUCTIVE SLEEP APNEA 10/09/2007  . Plantar fasciitis    History - right  . Sleep apnea    Uses CPAP  . TRANSAMINASES, SERUM, ELEVATED 07/08/2009    Past Surgical History:  Procedure Laterality Date  . ABDOMINAL HYSTERECTOMY  1979   prolapsed uterus  . BASAL CELL CARCINOMA EXCISION    . COLONOSCOPY  2006   [facility]  . mamoplasty     reduction  . TONSILLECTOMY  1959  . TUBAL LIGATION  1976    There were no vitals filed for this visit.      Subjective Assessment - 09/23/16 0855    Subjective I am feeling better, the Mobic has really helped.    Currently in Pain? No/denies   Multiple Pain Sites No                         [facility] Adult PT Treatment/Exercise - 09/23/16 0001      Lumbar Exercises: Seated   Other Seated Lumbar Exercises On green ball: sitting posture, breathing, conncecting breathing with lower abs, pelvis AROM movements.   Instructed pt in  proper size of ball to purchase.      Knee/Hip Exercises: Stretches   Active Hamstring Stretch Left;2 reps;20 seconds   Piriformis Stretch Left;2 reps;20 seconds   Other Knee/Hip Stretches Single knee to chest bil    Other Knee/Hip Stretches Hip IR/ER AROM bil 10x     Knee/Hip Exercises: Supine   Other Supine Knee/Hip Exercises AROM for add/abd/flex 10x each                   PT Short Term Goals - 09/23/16 0927      PT SHORT TERM GOAL #1   Title be independent in initial HEP   Time 4   Period Weeks   Status Achieved           PT Long Term Goals - 09/21/16 1230      PT LONG TERM GOAL #1   Title be independent in advanced HEP   Time 8   Period Weeks   Status New     PT LONG TERM GOAL #2   Title reduce FOTO to < or = to 37% limitation   Time 8   Period Weeks   Status New     PT LONG TERM GOAL #3  Title report a 60% reduction in Rt hip pain with ascending steps or inclines   Time 8   Period Weeks   Status New     PT LONG TERM GOAL #4   Title walk dog for exercise without increased Rt hip pain   Time 8   Period Weeks   Status New     PT LONG TERM GOAL #5   Title demonstrate and verbalize correct body mechanics for lumbar protection   Time 8   Period Weeks   Status New               Plan - 09/23/16 5189    Clinical Impression Statement Pt compliant with HEP. Pain is almost abolished and she reports now she feels she can concentrate on her movements more now that the pain doen not interfere. Focus was on hip mobility for most of the treatment as well as introducing pt to the exercise ball she is planning on purchasing. Focus on the ball was sitting posture, breathing in proper posture and connecting her breath to her core.    PT Next Visit Plan review hip opening series from today, give HEP for ball exercises concentrating on pelvic mobility if pt bought one.    Consulted and Agree with Plan of Care Patient      Patient will benefit from  skilled therapeutic intervention in order to improve the following deficits and impairments:     Visit Diagnosis: Pain in right hip  Chronic bilateral low back pain without sciatica  Stiffness of left hip, not elsewhere classified  Stiffness of right hip, not elsewhere classified     Problem List Patient Active Problem List   Diagnosis Date Noted  . Degenerative disc disease, lumbar 09/19/2016  . Arthritis, midfoot 04/12/2016  . Tarsal tunnel syndrome of right side 03/17/2016  . Tibialis posterior tendon tear, nontraumatic 02/25/2016  . Biceps tendinitis on left 07/14/2015  . Tremor of left hand 07/14/2015  . Mild carpal tunnel syndrome of left wrist 06/23/2014  . Greater trochanteric bursitis of right hip 06/23/2014  . Obesity (BMI 30-39.9) 09/05/2013  . Asthmatic bronchitis 08/12/2013  . TRANSAMINASES, SERUM, ELEVATED 07/08/2009  . Diabetes type 2, uncontrolled ([place]) 03/16/2009  . FATIGUE 10/21/2008  . Hyperlipemia 10/09/2007  . Obstructive sleep apnea 10/09/2007  . Essential hypertension 10/09/2007  . ALLERGIC RHINITIS 10/09/2007  . FIBROMYALGIA 10/09/2007    ,, PTA 09/23/2016, 9:28 AM  [facility] [address] [place], [place], [address] Phone: [phone]   Fax:  [phone]  Name: Dana Dyer MRN: [id number] Date of Birth: Nov 13, 1951

## 2016-09-24 ENCOUNTER — Other Ambulatory Visit: Payer: Self-pay | Admitting: Family Medicine

## 2016-09-26 ENCOUNTER — Ambulatory Visit (INDEPENDENT_AMBULATORY_CARE_PROVIDER_SITE_OTHER): Payer: Medicare Other | Admitting: Adult Health

## 2016-09-26 ENCOUNTER — Ambulatory Visit: Payer: Medicare Other | Admitting: Physical Therapy

## 2016-09-26 ENCOUNTER — Encounter: Payer: Self-pay | Admitting: Adult Health

## 2016-09-26 DIAGNOSIS — G4733 Obstructive sleep apnea (adult) (pediatric): Secondary | ICD-10-CM | POA: Diagnosis not present

## 2016-09-26 NOTE — Assessment & Plan Note (Signed)
Sleep apnea, well controlled on nocturnal C Pap  Plan  Patient Instructions  Keep up the good work on C Pap at bedtime Do not drive if sleepy Continue to be active and work on weight loss. Follow-up in one year with Dr. Elsworth Soho and as needed

## 2016-09-26 NOTE — Patient Instructions (Signed)
Keep up the good work on C Pap at bedtime Do not drive if sleepy Continue to be active and work on weight loss. Follow-up in one year with Dr. Elsworth Soho and as needed

## 2016-09-26 NOTE — Progress Notes (Signed)
Subjective:    Patient ID: Dana Dyer, female    DOB: Apr 19, 1951, 65 y.o.   MRN: [id number]  HPI 65 yo female followed for OSA and AR  TEST  PSG 04/2005 showed mild OSA with AHI 12/hr, severe in REM with AHI 32/h --> corrected with CPAP +10 cm , pillows with chin strap ( confirmed by download on auto ) HST 06/2015 >> mild OSA 13/hr - new CPAP Rx  Download 12/2015 >> 10 cm, no residuals, excellent usage, no leak  09/26/2016 Follow up: OSA  Patient presents for a 6 month follow-up for sleep apnea. Patient says she is doing well on C Pap. She uses on average 7 hours each night. Has some daytime sleepiness/fatigue. She says this is from her chronic fatigue and FM.  She says she remains very active, plays golf, does aerobics.  Download over the last 30 days shows excellent compliance with average usage at 7.5 hours. She is on a set pressure of 10 cm H2O. AHI 0.3. Minimum leaks.    Past Medical History:  Diagnosis Date  . ALLERGIC RHINITIS 10/09/2007  . Arthritis    hips, spine  . DIABETES MELLITUS, UNCONTROLLED 03/16/2009  . FATIGUE 10/21/2008  . FIBROMYALGIA 10/09/2007  . GERD (gastroesophageal reflux disease)    diet  controlled, no meds  . H/O vaginal hysterectomy   . HYPERLIPIDEMIA 10/09/2007   diet controlled, no meds  . HYPERTENSION 10/09/2007  . Morton's neuroma   . OBSTRUCTIVE SLEEP APNEA 10/09/2007  . Plantar fasciitis    History - right  . Sleep apnea    Uses CPAP  . TRANSAMINASES, SERUM, ELEVATED 07/08/2009   Current Outpatient Prescriptions on File Prior to Visit  Medication Sig Dispense Refill  . amLODipine (NORVASC) 5 MG tablet TAKE 1 TABLET BY MOUTH EVERY DAY 90 tablet 2  . BD ULTRA-FINE PEN NEEDLES 29G X 12.7MM MISC USE AS DIRECTED 3 TIMES DAILY. 360 each 5  . escitalopram (LEXAPRO) 10 MG tablet TAKE ONE TABLET BY MOUTH ONE TIME DAILY 90 tablet 1  . HUMALOG KWIKPEN 100 UNIT/ML KiwkPen INJECT 5 UNITS WITH LUNCH AND 5 UNITS WITH SUPPER AS DIRECTED (Patient taking  differently: INJECT 10 UNITS WITH LUNCH AND 15 UNITS WITH SUPPER AS DIRECTED) 3 pen 11  . Lancets (ONETOUCH ULTRASOFT) lancets Use as instructed twice daily 100 each 12  . LANTUS SOLOSTAR 100 UNIT/ML Solostar Pen INJECT 56 UNITS DAILY AS DIRECTED (Patient taking differently: INJECT 62 UNITS DAILY AS DIRECTED) 15 pen 5  . losartan-hydrochlorothiazide (HYZAAR) 100-25 MG tablet TAKE 1 TABLET BY MOUTH ONCE DAILY 90 tablet 1  . meloxicam (MOBIC) 15 MG tablet Take 1 tablet (15 mg total) by mouth daily. 30 tablet 6  . metFORMIN (GLUCOPHAGE) 1000 MG tablet TAKE 1 TABLET BY MOUTH TWICE A DAY 180 tablet 2  . ONETOUCH VERIO test strip TEST 3 TIMES DAILY 300 each 1  . Pitavastatin Calcium (LIVALO) 2 MG TABS Take one tablet daily. 30 tablet 6   Current Facility-Administered Medications on File Prior to Visit  Medication Dose Route Frequency Provider Last Rate Last Dose  . TDaP (BOOSTRIX) injection 0.5 mL  0.5 mL Intramuscular Once Eulas Post, MD         Review of Systems Constitutional:   No  weight loss, night sweats,  Fevers, chills,  +fatigue, or  lassitude.  HEENT:   No headaches,  Difficulty swallowing,  Tooth/dental problems, or  Sore throat,  No sneezing, itching, ear ache, nasal congestion, post nasal drip,   CV:  No chest pain,  Orthopnea, PND, swelling in lower extremities, anasarca, dizziness, palpitations, syncope.   GI  No heartburn, indigestion, abdominal pain, nausea, vomiting, diarrhea, change in bowel habits, loss of appetite, bloody stools.   Resp: No shortness of breath with exertion or at rest.  No excess mucus, no productive cough,  No non-productive cough,  No coughing up of blood.  No change in color of mucus.  No wheezing.  No chest wall deformity  Skin: no rash or lesions.  GU: no dysuria, change in color of urine, no urgency or frequency.  No flank pain, no hematuria   MS:  No joint pain or swelling.  No decreased range of motion.  No back  pain.  Psych:  No change in mood or affect. No depression or anxiety.  No memory loss.         Objective:   Physical Exam  Vitals:   09/26/16 0952  BP: 136/70  Pulse: 89  Temp: 98.5 F (36.9 C)  TempSrc: Oral  SpO2: 95%  Weight: 181 lb (82.1 kg)  Height: 5' 3"  (1.6 m)  Body mass index is 32.06 kg/m.   GEN: A/Ox3; pleasant , NAD, well nourished    HEENT:  [place]/AT,  EACs-clear, TMs-wnl, NOSE-clear, THROAT-clear, no lesions, no postnasal drip or exudate noted. Class 2-3 MP airway   NECK:  Supple w/ fair ROM; no JVD; normal carotid impulses w/o bruits; no thyromegaly or nodules palpated; no lymphadenopathy.    RESP  Clear  P & A; w/o, wheezes/ rales/ or rhonchi. no accessory muscle use, no dullness to percussion  CARD:  RRR, no m/r/g  , no peripheral edema, pulses intact, no cyanosis or clubbing.  GI:   Soft & nt; nml bowel sounds; no organomegaly or masses detected.   Musco: Warm bil, no deformities or joint swelling noted.   Neuro: alert, no focal deficits noted.    Skin: Warm, no lesions or rashes  Tammy Parrett NP-C  [facility]  09/26/2016      Assessment & Plan:

## 2016-09-27 ENCOUNTER — Encounter: Payer: Medicare Other | Admitting: Family Medicine

## 2016-09-28 ENCOUNTER — Encounter: Payer: Self-pay | Admitting: Physical Therapy

## 2016-09-28 ENCOUNTER — Ambulatory Visit: Payer: Medicare Other | Admitting: Physical Therapy

## 2016-09-28 DIAGNOSIS — M25551 Pain in right hip: Secondary | ICD-10-CM | POA: Diagnosis not present

## 2016-09-28 DIAGNOSIS — M545 Low back pain: Secondary | ICD-10-CM

## 2016-09-28 DIAGNOSIS — M25652 Stiffness of left hip, not elsewhere classified: Secondary | ICD-10-CM

## 2016-09-28 DIAGNOSIS — M25651 Stiffness of right hip, not elsewhere classified: Secondary | ICD-10-CM

## 2016-09-28 DIAGNOSIS — G8929 Other chronic pain: Secondary | ICD-10-CM

## 2016-09-28 NOTE — Therapy (Signed)
[facility] [address] [place], [place], [address] Phone: [phone]   Fax:  [phone]  Physical Therapy Treatment  Patient Details  Name: Dana Dyer MRN: [id number] Date of Birth: 1951/03/19 Referring Provider: Hulan Saas, MD  Encounter Date: 09/28/2016      PT End of Session - 09/28/16 1016    Visit Number 3   Number of Visits 10   Date for PT Re-Evaluation 11/16/16   PT Start Time 1013   PT Stop Time 1100   PT Time Calculation (min) 47 min   Activity Tolerance Patient tolerated treatment well   Behavior During Therapy [facility] for tasks assessed/performed      Past Medical History:  Diagnosis Date  . ALLERGIC RHINITIS 10/09/2007  . Arthritis    hips, spine  . DIABETES MELLITUS, UNCONTROLLED 03/16/2009  . FATIGUE 10/21/2008  . FIBROMYALGIA 10/09/2007  . GERD (gastroesophageal reflux disease)    diet  controlled, no meds  . H/O vaginal hysterectomy   . HYPERLIPIDEMIA 10/09/2007   diet controlled, no meds  . HYPERTENSION 10/09/2007  . Morton's neuroma   . OBSTRUCTIVE SLEEP APNEA 10/09/2007  . Plantar fasciitis    History - right  . Sleep apnea    Uses CPAP  . TRANSAMINASES, SERUM, ELEVATED 07/08/2009    Past Surgical History:  Procedure Laterality Date  . ABDOMINAL HYSTERECTOMY  1979   prolapsed uterus  . BASAL CELL CARCINOMA EXCISION    . COLONOSCOPY  2006   [place]  . mamoplasty     reduction  . TONSILLECTOMY  1959  . TUBAL LIGATION  1976    There were no vitals filed for this visit.      Subjective Assessment - 09/28/16 1017    Subjective I am still feeling pretty good. 80% less pain per pt reports this AM.   Currently in Pain? No/denies   Multiple Pain Sites No                         [facility] Adult PT Treatment/Exercise - 09/28/16 0001      Lumbar Exercises: Aerobic   Stationary Bike Nustep L4 x 6 min  Concurrent review of HEP handout     Lumbar Exercises:  Supine   Heel Slides 10 reps  Bil with core focus, VC on core   Bent Knee Raise 10 reps  Bil, VC for core activation     Knee/Hip Exercises: Sidelying   Other Sidelying Knee/Hip Exercises Pilates leg kick 10x Bil ,VC for technique                PT Education - 09/28/16 1034    Education provided --  Review with pt today the previous hip exs   Methods Handout          PT Short Term Goals - 09/28/16 1019      PT SHORT TERM GOAL #2   Title walk dog for exercise with < or = to 3/10 Rt hip pain   Time 4   Period Weeks   Status Achieved     PT SHORT TERM GOAL #3   Title report a 30% reduction in Rt hip pain with ascending steps or inclines   Time 4   Period Weeks   Status Achieved  60%           PT Long Term Goals - 09/21/16 1230      PT LONG TERM GOAL #1  Title be independent in advanced HEP   Time 8   Period Weeks   Status New     PT LONG TERM GOAL #2   Title reduce FOTO to < or = to 37% limitation   Time 8   Period Weeks   Status New     PT LONG TERM GOAL #3   Title report a 60% reduction in Rt hip pain with ascending steps or inclines   Time 8   Period Weeks   Status New     PT LONG TERM GOAL #4   Title walk dog for exercise without increased Rt hip pain   Time 8   Period Weeks   Status New     PT LONG TERM GOAL #5   Title demonstrate and verbalize correct body mechanics for lumbar protection   Time 8   Period Weeks   Status New               Plan - 09/28/16 1054    Clinical Impression Statement Pt reports today that her pain is overall 80% abolished, yet stairs are still challenging. She has met all STGs this week and appears to be more towards LTGs. She continues to be compliant with her HEP  which was also adavanced today to include intergrating core stabilization with her hip AROM & strength. She has not yet purchased exercise ball.    Rehab Potential Good   PT Frequency 2x / week   PT Duration 8 weeks   PT  Treatment/Interventions ADLs/Self Care Home Management;Cryotherapy;Electrical Stimulation;Functional mobility training;Stair training;Ultrasound;Moist Heat;Therapeutic activities;Traction;Therapeutic exercise;Neuromuscular re-education;Patient/family education;Passive range of motion;Manual techniques;Dry needling;Taping   PT Next Visit Plan Review new HEP given today then progress core and hip strengthening/stabilization  exs. Add step ups next session.   Consulted and Agree with Plan of Care Patient      Patient will benefit from skilled therapeutic intervention in order to improve the following deficits and impairments:  Decreased range of motion, Pain, Decreased mobility, Impaired flexibility, Improper body mechanics, Decreased activity tolerance, Decreased endurance  Visit Diagnosis: Pain in right hip  Chronic bilateral low back pain without sciatica  Stiffness of right hip, not elsewhere classified  Stiffness of left hip, not elsewhere classified     Problem List Patient Active Problem List   Diagnosis Date Noted  . Degenerative disc disease, lumbar 09/19/2016  . Arthritis, midfoot 04/12/2016  . Tarsal tunnel syndrome of right side 03/17/2016  . Tibialis posterior tendon tear, nontraumatic 02/25/2016  . Biceps tendinitis on left 07/14/2015  . Tremor of left hand 07/14/2015  . Mild carpal tunnel syndrome of left wrist 06/23/2014  . Greater trochanteric bursitis of right hip 06/23/2014  . Obesity (BMI 30-39.9) 09/05/2013  . Asthmatic bronchitis 08/12/2013  . TRANSAMINASES, SERUM, ELEVATED 07/08/2009  . Diabetes type 2, uncontrolled ([place]) 03/16/2009  . FATIGUE 10/21/2008  . Hyperlipemia 10/09/2007  . Obstructive sleep apnea 10/09/2007  . Essential hypertension 10/09/2007  . ALLERGIC RHINITIS 10/09/2007  . FIBROMYALGIA 10/09/2007    COCHRAN,JENNIFER, PTA 09/28/2016, 11:20 AM  [facility] [address], [place], [place], [address] Phone: [phone]   Fax:  [phone]  Name: Dana Dyer MRN: [id number] Date of Birth: 08-25-1951

## 2016-09-28 NOTE — Patient Instructions (Addendum)
Hip Motion Exercises:   1. Start laying on your back, engage lower abs first, then move thigh bone out & in gently to explore your available motion. Work your way to the bed/floor if available. RELAX!!  2. Straighten the opposite leg and put the bottom of the foot along the inside part of the straight leg. Let the knee fall out to the side and relax. Breathe, when you feel ready slide the heel up & down  the inside part of the leg. Move smoothly and slowly. Do 5-10 x.   3. You can increase the stretch by putting your ankle above or below the knee versus the inside part of the leg.   5. Finally, go into the main stretch where you pull the opposite thigh/knee to the chest. Do a few on each leg. Hold for 3-4 breaths.    Side Kick    Lie on side, stack your hips on top of each other and bend the bottom knee. Lengthen the sides of your waist to feel longer, then engage your lower core muscles,  lift top leg to hip height. Slowly, keeping knee straight bring the leg forward and back, moving smoothly. BREATHE!! Keep leg hip height, torso still. Head can rest on a few pillows.  Repeat ___10_ times. Repeat on other side. Do __1__ sessions per day.  http://pm.exer.us/134   Copyright  VHI. All rights reserved.

## 2016-09-29 ENCOUNTER — Encounter: Payer: Self-pay | Admitting: Adult Health

## 2016-09-29 LAB — HM DIABETES EYE EXAM

## 2016-09-30 ENCOUNTER — Ambulatory Visit: Payer: Medicare Other | Admitting: Physical Therapy

## 2016-09-30 ENCOUNTER — Encounter: Payer: Self-pay | Admitting: Physical Therapy

## 2016-09-30 DIAGNOSIS — M545 Low back pain, unspecified: Secondary | ICD-10-CM

## 2016-09-30 DIAGNOSIS — M25551 Pain in right hip: Secondary | ICD-10-CM | POA: Diagnosis not present

## 2016-09-30 DIAGNOSIS — G8929 Other chronic pain: Secondary | ICD-10-CM

## 2016-09-30 DIAGNOSIS — M25652 Stiffness of left hip, not elsewhere classified: Secondary | ICD-10-CM

## 2016-09-30 DIAGNOSIS — M25651 Stiffness of right hip, not elsewhere classified: Secondary | ICD-10-CM

## 2016-09-30 NOTE — Progress Notes (Signed)
Reviewed & agree with plan  

## 2016-09-30 NOTE — Patient Instructions (Signed)
ABDUCTION: Standing (Active)    Stand, feet flat. Lift right leg out to side. Use ___ lbs. Complete ___ sets of ___ repetitions. Perform ___ sessions per day.  http://gtsc.exer.us/111   Copyright  VHI. All rights reserved.   Hip Extension (Standing)    Stand with support. Squeeze pelvic floor and hold. Move right leg backward with straight knee. Hold for ___ seconds. Relax for ___ seconds. Repeat ___ times. Do ___ times a day. Repeat with other leg. Add ___ lb weight.   Copyright  VHI. All rights reserved.   Jeanie Sewer PTA [facility] [address], [place], [place] [address] Phone # [phone] Fax [phone]

## 2016-09-30 NOTE — Therapy (Signed)
[facility] [address] [place], [place], [address] Phone: [phone]   Fax:  [phone]  Physical Therapy Treatment  Patient Details  Name: Dana Dyer MRN: [id number] Date of Birth: February 22, 1951 Referring Provider: Hulan Saas, MD  Encounter Date: 09/30/2016      PT End of Session - 09/30/16 0935    Visit Number 4   Number of Visits 10   Date for PT Re-Evaluation 11/16/16   PT Start Time 0930   PT Stop Time 1008   PT Time Calculation (min) 38 min   Activity Tolerance Patient tolerated treatment well   Behavior During Therapy [facility] for tasks assessed/performed      Past Medical History:  Diagnosis Date  . ALLERGIC RHINITIS 10/09/2007  . Arthritis    hips, spine  . DIABETES MELLITUS, UNCONTROLLED 03/16/2009  . FATIGUE 10/21/2008  . FIBROMYALGIA 10/09/2007  . GERD (gastroesophageal reflux disease)    diet  controlled, no meds  . H/O vaginal hysterectomy   . HYPERLIPIDEMIA 10/09/2007   diet controlled, no meds  . HYPERTENSION 10/09/2007  . Morton's neuroma   . OBSTRUCTIVE SLEEP APNEA 10/09/2007  . Plantar fasciitis    History - right  . Sleep apnea    Uses CPAP  . TRANSAMINASES, SERUM, ELEVATED 07/08/2009    Past Surgical History:  Procedure Laterality Date  . ABDOMINAL HYSTERECTOMY  1979   prolapsed uterus  . BASAL CELL CARCINOMA EXCISION    . COLONOSCOPY  2006   Wiseman  . mamoplasty     reduction  . TONSILLECTOMY  1959  . TUBAL LIGATION  1976    There were no vitals filed for this visit.      Subjective Assessment - 09/30/16 0933    Subjective Pt reports hip feeling better, continues to have pain with going up stairs.    Pertinent History injection into Rt trochanteric bursa, fibromyalgia   Limitations Sitting;Walking   How long can you sit comfortably? in the car is limited   How long can you walk comfortably? climbing steps and incline is limited due to pain.  Able to walk a mile  today and had pain afterwards.   Diagnostic tests [place] of Rt hip  in MD office: Rt hip bursitis   Patient Stated Goals ascend/descend steps or incline with less pain, play golf with less pain, improve flexiblity and core strength   Currently in Pain? Yes   Pain Score 2    Pain Location Hip   Pain Orientation Right   Pain Descriptors / Indicators Tightness   Pain Type Chronic pain   Pain Onset More than a month ago   Pain Frequency Constant                         [facility] Adult PT Treatment/Exercise - 09/30/16 0001      Lumbar Exercises: Aerobic   Stationary Bike Nustep L5 x 8 min  Concurrent review of HEP handout     Lumbar Exercises: Supine   Heel Slides 10 reps  Bil with core focus, VC on core   Bent Knee Raise 10 reps  Bil, VC for core activation     Knee/Hip Exercises: Stretches   Active Hamstring Stretch Left;2 reps;20 seconds   Piriformis Stretch Left;2 reps;20 seconds   Other Knee/Hip Stretches Single knee to chest bil    Other Knee/Hip Stretches Hip IR/ER AROM bil 10x     Knee/Hip Exercises: Standing  Hip Flexion Stengthening;Both;1 set;10 reps   Hip Abduction Stengthening;Both;1 set;10 reps   Hip Extension Stengthening;Both;1 set;10 reps   Forward Step Up Both;2 sets;Hand Hold: 1;Step Height: 4";Step Height: 6"   Forward Step Up Limitations Also walked up and down full flight 10 times  verbal cues for core activation     Knee/Hip Exercises: Sidelying   Other Sidelying Knee/Hip Exercises Pilates leg kick 10x Bil ,VC for technique                PT Education - 09/30/16 1005    Education provided Yes   Education Details Standing hip exercises   Person(s) Educated Patient   Methods Handout;Explanation;Demonstration   Comprehension Verbalized understanding          PT Short Term Goals - 09/28/16 1019      PT SHORT TERM GOAL #2   Title walk dog for exercise with < or = to 3/10 Rt hip pain   Time 4   Period Weeks   Status Achieved      PT SHORT TERM GOAL #3   Title report a 30% reduction in Rt hip pain with ascending steps or inclines   Time 4   Period Weeks   Status Achieved  60%           PT Long Term Goals - 09/21/16 1230      PT LONG TERM GOAL #1   Title be independent in advanced HEP   Time 8   Period Weeks   Status New     PT LONG TERM GOAL #2   Title reduce FOTO to < or = to 37% limitation   Time 8   Period Weeks   Status New     PT LONG TERM GOAL #3   Title report a 60% reduction in Rt hip pain with ascending steps or inclines   Time 8   Period Weeks   Status New     PT LONG TERM GOAL #4   Title walk dog for exercise without increased Rt hip pain   Time 8   Period Weeks   Status New     PT LONG TERM GOAL #5   Title demonstrate and verbalize correct body mechanics for lumbar protection   Time 8   Period Weeks   Status New               Plan - 09/30/16 1007    Clinical Impression Statement Pt reports hip feeling good but continues to have increase pain with stair climbing. Pt able to tolerate standing exercises well and able to tolerate stair climbing exercises with no pain. Pt will continue to benefit from skilled therapy for hip and core strength and stability.    Rehab Potential Good   PT Frequency 2x / week   PT Duration 8 weeks   PT Treatment/Interventions ADLs/Self Care Home Management;Cryotherapy;Electrical Stimulation;Functional mobility training;Stair training;Ultrasound;Moist Heat;Therapeutic activities;Traction;Therapeutic exercise;Neuromuscular re-education;Patient/family education;Passive range of motion;Manual techniques;Dry needling;Taping   PT Next Visit Plan Review new HEP given today then progress core and hip strengthening/stabilization  exs.    Consulted and Agree with Plan of Care Patient      Patient will benefit from skilled therapeutic intervention in order to improve the following deficits and impairments:  Decreased range of motion, Pain,  Decreased mobility, Impaired flexibility, Improper body mechanics, Decreased activity tolerance, Decreased endurance  Visit Diagnosis: Pain in right hip  Chronic bilateral low back pain without sciatica  Stiffness of right hip, not  elsewhere classified  Stiffness of left hip, not elsewhere classified     Problem List Patient Active Problem List   Diagnosis Date Noted  . Degenerative disc disease, lumbar 09/19/2016  . Arthritis, midfoot 04/12/2016  . Tarsal tunnel syndrome of right side 03/17/2016  . Tibialis posterior tendon tear, nontraumatic 02/25/2016  . Biceps tendinitis on left 07/14/2015  . Tremor of left hand 07/14/2015  . Mild carpal tunnel syndrome of left wrist 06/23/2014  . Greater trochanteric bursitis of right hip 06/23/2014  . Obesity (BMI 30-39.9) 09/05/2013  . Asthmatic bronchitis 08/12/2013  . TRANSAMINASES, SERUM, ELEVATED 07/08/2009  . Diabetes type 2, uncontrolled ([place]) 03/16/2009  . FATIGUE 10/21/2008  . Hyperlipemia 10/09/2007  . Obstructive sleep apnea 10/09/2007  . Essential hypertension 10/09/2007  . ALLERGIC RHINITIS 10/09/2007  . FIBROMYALGIA 10/09/2007    Mikle Bosworth PTA 09/30/2016, 10:34 AM  [facility] [address] [place], [place], [address] Phone: [phone]   Fax:  [phone]  Name: Dana Dyer MRN: [id number] Date of Birth: December 17, 1950

## 2016-10-03 ENCOUNTER — Ambulatory Visit: Payer: Medicare Other | Admitting: Physical Therapy

## 2016-10-03 ENCOUNTER — Encounter: Payer: Self-pay | Admitting: Physical Therapy

## 2016-10-03 DIAGNOSIS — M25551 Pain in right hip: Secondary | ICD-10-CM | POA: Diagnosis not present

## 2016-10-03 DIAGNOSIS — M545 Low back pain: Secondary | ICD-10-CM

## 2016-10-03 DIAGNOSIS — M25651 Stiffness of right hip, not elsewhere classified: Secondary | ICD-10-CM

## 2016-10-03 DIAGNOSIS — G8929 Other chronic pain: Secondary | ICD-10-CM

## 2016-10-03 DIAGNOSIS — M25652 Stiffness of left hip, not elsewhere classified: Secondary | ICD-10-CM

## 2016-10-03 NOTE — Therapy (Signed)
[facility] [address] [place], [place], [address] Phone: [phone]   Fax:  [phone]  Physical Therapy Treatment  Patient Details  Name: Dana Dyer MRN: [id number] Date of Birth: [birth year] Referring Provider: Hulan Saas, MD  Encounter Date: 10/03/2016      PT End of Session - 10/03/16 0838    Visit Number 5   Number of Visits 10   Date for PT Re-Evaluation 11/16/16   PT Start Time 0803   PT Stop Time 0843   PT Time Calculation (min) 40 min   Activity Tolerance Patient tolerated treatment well   Behavior During Therapy [facility] for tasks assessed/performed      Past Medical History:  Diagnosis Date  . ALLERGIC RHINITIS 10/09/2007  . Arthritis    hips, spine  . DIABETES MELLITUS, UNCONTROLLED 03/16/2009  . FATIGUE 10/21/2008  . FIBROMYALGIA 10/09/2007  . GERD (gastroesophageal reflux disease)    diet  controlled, no meds  . H/O vaginal hysterectomy   . HYPERLIPIDEMIA 10/09/2007   diet controlled, no meds  . HYPERTENSION 10/09/2007  . Morton's neuroma   . OBSTRUCTIVE SLEEP APNEA 10/09/2007  . Plantar fasciitis    History - right  . Sleep apnea    Uses CPAP  . TRANSAMINASES, SERUM, ELEVATED 07/08/2009    Past Surgical History:  Procedure Laterality Date  . ABDOMINAL HYSTERECTOMY  1979   prolapsed uterus  . BASAL CELL CARCINOMA EXCISION    . COLONOSCOPY  2006   Wiseman  . mamoplasty     reduction  . TONSILLECTOMY  1959  . TUBAL LIGATION  1976    There were no vitals filed for this visit.      Subjective Assessment - 10/03/16 0806    Subjective Pt states she continues to have pain with stair negotiation, feeling better overall   Pertinent History injection into Rt trochanteric bursa, fibromyalgia   Diagnostic tests [place] of Rt hip  in MD office: Rt hip bursitis   Patient Stated Goals ascend/descend steps or incline with less pain, play golf with less pain, improve flexiblity and core  strength   Currently in Pain? No/denies  "just stiff"                         [facility] Adult PT Treatment/Exercise - 10/03/16 0001      Lumbar Exercises: Aerobic   Stationary Bike Nustep level 5 x 8 minutes     Lumbar Exercises: Supine   Heel Slides 10 reps  focus on core   Bent Knee Raise 10 reps  focus on core     Knee/Hip Exercises: Stretches   Active Hamstring Stretch Both;2 reps;30 seconds   Piriformis Stretch Both;2 reps;30 seconds   Other Knee/Hip Stretches hip ER/IR AROM     Knee/Hip Exercises: Standing   Hip Flexion Stengthening;Both;2 sets;10 reps  1.5#   Hip Abduction Stengthening;Both;2 sets;10 reps  1.5#   Hip Extension Stengthening;Both;2 sets;10 reps  1.5#   Lateral Step Up Both;2 sets;10 reps;Hand Hold: 1;Step Height: 6"   Forward Step Up Limitations --  up/down stairs x 2 reciprocal   Other Standing Knee Exercises hamstring curl 2 x 10 1.5#     Knee/Hip Exercises: Sidelying   Other Sidelying Knee/Hip Exercises pilates leg kick x 10 bilat                  PT Short Term Goals - 10/03/16 8115  PT SHORT TERM GOAL #1   Title be independent in initial HEP   Status Achieved     PT SHORT TERM GOAL #2   Title walk dog for exercise with < or = to 3/10 Rt hip pain   Status Achieved     PT SHORT TERM GOAL #3   Title report a 30% reduction in Rt hip pain with ascending steps or inclines   Status Achieved           PT Long Term Goals - 10/03/16 0841      PT LONG TERM GOAL #1   Title be independent in advanced HEP   Status On-going     PT LONG TERM GOAL #2   Title reduce FOTO to < or = to 37% limitation   Status On-going     PT LONG TERM GOAL #3   Title report a 60% reduction in Rt hip pain with ascending steps or inclines   Status On-going     PT LONG TERM GOAL #4   Title walk dog for exercise without increased Rt hip pain   Status On-going     PT LONG TERM GOAL #5   Title demonstrate and verbalize correct body  mechanics for lumbar protection   Status On-going               Plan - 10/03/16 0838    Clinical Impression Statement Able to add weight to standing exercises, continues to require cues for breathing and core activation during core exercises. pt able to better tolerate lateral step ups vs forwards. pt will continue to benefit from skilled PT to address deficits and improve strength and stability   Rehab Potential Good   PT Frequency 2x / week   PT Duration 8 weeks   PT Treatment/Interventions ADLs/Self Care Home Management;Cryotherapy;Electrical Stimulation;Functional mobility training;Stair training;Ultrasound;Moist Heat;Therapeutic activities;Traction;Therapeutic exercise;Neuromuscular re-education;Patient/family education;Passive range of motion;Manual techniques;Dry needling;Taping   PT Next Visit Plan Continue to progress standing therex and stairs, core strengthening and stabilization   Consulted and Agree with Plan of Care Patient      Patient will benefit from skilled therapeutic intervention in order to improve the following deficits and impairments:  Decreased range of motion, Pain, Decreased mobility, Impaired flexibility, Improper body mechanics, Decreased activity tolerance, Decreased endurance  Visit Diagnosis: Pain in right hip  Chronic bilateral low back pain without sciatica  Stiffness of right hip, not elsewhere classified  Stiffness of left hip, not elsewhere classified     Problem List Patient Active Problem List   Diagnosis Date Noted  . Degenerative disc disease, lumbar 09/19/2016  . Arthritis, midfoot 04/12/2016  . Tarsal tunnel syndrome of right side 03/17/2016  . Tibialis posterior tendon tear, nontraumatic 02/25/2016  . Biceps tendinitis on left 07/14/2015  . Tremor of left hand 07/14/2015  . Mild carpal tunnel syndrome of left wrist 06/23/2014  . Greater trochanteric bursitis of right hip 06/23/2014  . Obesity (BMI 30-39.9) 09/05/2013  .  Asthmatic bronchitis 08/12/2013  . TRANSAMINASES, SERUM, ELEVATED 07/08/2009  . Diabetes type 2, uncontrolled ([place]) 03/16/2009  . FATIGUE 10/21/2008  . Hyperlipemia 10/09/2007  . Obstructive sleep apnea 10/09/2007  . Essential hypertension 10/09/2007  . ALLERGIC RHINITIS 10/09/2007  . FIBROMYALGIA 10/09/2007    Isabelle Course, PT, DPT 10/03/2016, 8:42 AM  [facility] [address] [place], [place], [address] Phone: [phone]   Fax:  [phone]  Name: Dana Dyer MRN: [id number] Date of Birth: February 13, 1951

## 2016-10-04 ENCOUNTER — Encounter: Payer: Self-pay | Admitting: Family Medicine

## 2016-10-04 ENCOUNTER — Ambulatory Visit (INDEPENDENT_AMBULATORY_CARE_PROVIDER_SITE_OTHER): Payer: Medicare Other | Admitting: Family Medicine

## 2016-10-04 VITALS — BP 140/80 | HR 96 | Temp 98.2°F | Ht 62.0 in | Wt 183.0 lb

## 2016-10-04 DIAGNOSIS — IMO0002 Reserved for concepts with insufficient information to code with codable children: Secondary | ICD-10-CM

## 2016-10-04 DIAGNOSIS — E1165 Type 2 diabetes mellitus with hyperglycemia: Secondary | ICD-10-CM

## 2016-10-04 DIAGNOSIS — Z23 Encounter for immunization: Secondary | ICD-10-CM | POA: Diagnosis not present

## 2016-10-04 DIAGNOSIS — Z0000 Encounter for general adult medical examination without abnormal findings: Secondary | ICD-10-CM

## 2016-10-04 DIAGNOSIS — E785 Hyperlipidemia, unspecified: Secondary | ICD-10-CM | POA: Diagnosis not present

## 2016-10-04 DIAGNOSIS — E1142 Type 2 diabetes mellitus with diabetic polyneuropathy: Secondary | ICD-10-CM

## 2016-10-04 DIAGNOSIS — Z794 Long term (current) use of insulin: Secondary | ICD-10-CM

## 2016-10-04 NOTE — Progress Notes (Signed)
Pre visit review using our clinic review tool, if applicable. No additional management support is needed unless otherwise documented below in the visit note. 

## 2016-10-04 NOTE — Progress Notes (Signed)
Subjective:     Patient ID: Dana Dyer, female   DOB: Oct 08, 1951, 65 y.o.   MRN: [id number]  HPI Patient seen for complete physical. She has history of hypertension, obesity, obstructive sleep apnea, type 2 diabetes, fibromyalgia, hyperlipidemia, chronic mild liver transaminase elevations, and fatty liver changes  She's had poorly controlled type 2 diabetes and history of poor compliance at times with diet and medication. She has unfortunately been intolerant of S GLT-2 inhibitors as well as GLP-1 class of medication. We had tried both Victoza and Trulicity but she had nausea with both medications. She is also currently not taking any statin medication. She has had myalgias with several. Her lipids have been extremely high in the past.  She is currently using several "essential oils "and is convinced that her blood sugars are improving since she has made that change. She is currently not using any Humalog. She remains on Lantus 70 units once daily and metformin. She states she had eye exam last week with no retinopathy changes. Overall feels well.  She's getting regular mammograms. Colonoscopy up-to-date. She's had previous hysterectomy.  Past Medical History:  Diagnosis Date  . ALLERGIC RHINITIS 10/09/2007  . Arthritis    hips, spine  . DIABETES MELLITUS, UNCONTROLLED 03/16/2009  . FATIGUE 10/21/2008  . FIBROMYALGIA 10/09/2007  . GERD (gastroesophageal reflux disease)    diet  controlled, no meds  . H/O vaginal hysterectomy   . HYPERLIPIDEMIA 10/09/2007   diet controlled, no meds  . HYPERTENSION 10/09/2007  . Morton's neuroma   . OBSTRUCTIVE SLEEP APNEA 10/09/2007  . Plantar fasciitis    History - right  . Sleep apnea    Uses CPAP  . TRANSAMINASES, SERUM, ELEVATED 07/08/2009   Past Surgical History:  Procedure Laterality Date  . ABDOMINAL HYSTERECTOMY  1979   prolapsed uterus  . BASAL CELL CARCINOMA EXCISION    . COLONOSCOPY  2006   [facility]  . mamoplasty     reduction  .  TONSILLECTOMY  1959  . TUBAL LIGATION  1976    reports that she has never smoked. She has never used smokeless tobacco. She reports that she does not drink alcohol or use drugs. family history includes Alcohol abuse in her father; Arthritis in her father and mother; Cancer in her mother; Diabetes in her mother; Hypertension in her maternal grandfather; Miscarriages / [place] in her paternal grandfather. Allergies  Allergen Reactions  . Amoxicillin-Pot Clavulanate     Severe diarrhea  . Codeine Sulfate     REACTION: "hyper"  . Crestor [Rosuvastatin Calcium] Other (See Comments)    myalgia  . Erythromycin Base   . Invokana [Canagliflozin] Other (See Comments)    Recurrent yeast infections  . Irbesartan-Hydrochlorothiazide   . Latex   . Victoza [Liraglutide] Nausea Only     Review of Systems  Constitutional: Negative for activity change, appetite change, fatigue, fever and unexpected weight change.  HENT: Negative for ear pain, hearing loss, sore throat and trouble swallowing.   Eyes: Negative for visual disturbance.  Respiratory: Negative for cough and shortness of breath.   Cardiovascular: Negative for chest pain and palpitations.  Gastrointestinal: Negative for abdominal pain, blood in stool, constipation and diarrhea.  Endocrine: Negative for polydipsia and polyuria.  Genitourinary: Negative for dysuria and hematuria.  Musculoskeletal: Negative for arthralgias, back pain and myalgias.  Skin: Negative for rash.  Neurological: Negative for dizziness, syncope and headaches.  Hematological: Negative for adenopathy.  Psychiatric/Behavioral: Negative for confusion and dysphoric mood.  Objective:   Physical Exam  Constitutional: She is oriented to person, place, and time. She appears well-developed and well-nourished.  HENT:  Head: Normocephalic and atraumatic.  Eyes: EOM are normal. Pupils are equal, round, and reactive to light.  Neck: Normal range of motion. Neck  supple. No thyromegaly present.  Cardiovascular: Normal rate, regular rhythm and normal heart sounds.   No murmur heard. Pulmonary/Chest: Breath sounds normal. No respiratory distress. She has no wheezes. She has no rales.  Abdominal: Soft. Bowel sounds are normal. She exhibits no distension and no mass. There is no tenderness. There is no rebound and no guarding.  Musculoskeletal: Normal range of motion. She exhibits no edema.  Lymphadenopathy:    She has no cervical adenopathy.  Neurological: She is alert and oriented to person, place, and time. She displays normal reflexes. No cranial nerve deficit.  Skin: No rash noted.  Psychiatric: She has a normal mood and affect. Her behavior is normal. Judgment and thought content normal.       Assessment:     Physical exam. Labs reviewed. Her A1c is somewhat improved though suboptimal control with A1c of 8.5. Her lipids are out of control    Plan:     -Pneumovax given. She received Prevnar 13 last year. Already received flu vaccine -Establish more consistent exercise -We discussed further management of diabetes. She is reluctant to use short acting insulin at this time. Unfortunately intolerant of S GLT-2 inhibitor as well as GLT-1 class. -We agreed to rechecking A1c and lipids in 3 months -We discussed current recommendations for statin therapy in type II diabetics irregardless of lipid level and she has very high lipids. She is very reluctant to take any lipid medications at this time. We did discuss other alternatives such as WelChol since she has normal triglycerides and the fact this could help with better glycemic control as well but this point she wishes to observe -Continue with regular mammograms  Eulas Post MD [facility]

## 2016-10-09 ENCOUNTER — Other Ambulatory Visit: Payer: Self-pay | Admitting: Family Medicine

## 2016-10-10 ENCOUNTER — Ambulatory Visit: Payer: Medicare Other | Attending: Family Medicine | Admitting: Physical Therapy

## 2016-10-10 DIAGNOSIS — M25652 Stiffness of left hip, not elsewhere classified: Secondary | ICD-10-CM

## 2016-10-10 DIAGNOSIS — M545 Low back pain, unspecified: Secondary | ICD-10-CM

## 2016-10-10 DIAGNOSIS — M25551 Pain in right hip: Secondary | ICD-10-CM | POA: Diagnosis not present

## 2016-10-10 DIAGNOSIS — G8929 Other chronic pain: Secondary | ICD-10-CM

## 2016-10-10 DIAGNOSIS — M25651 Stiffness of right hip, not elsewhere classified: Secondary | ICD-10-CM | POA: Diagnosis present

## 2016-10-10 NOTE — Therapy (Signed)
[facility] [address], [place], [place], [address] Phone: [phone]   Fax:  [phone]  Physical Therapy Treatment  Patient Details  Name: Dana Dyer MRN: [id number] Date of Birth: Dec 29, 1950 Referring Provider: Hulan Saas, MD  Encounter Date: 10/10/2016      PT End of Session - 10/10/16 0811    Visit Number 6   Number of Visits 10   Date for PT Re-Evaluation 11/16/16   PT Start Time 0807   PT Stop Time 0847   PT Time Calculation (min) 40 min   Activity Tolerance Patient tolerated treatment well   Behavior During Therapy [facility] for tasks assessed/performed      Past Medical History:  Diagnosis Date  . ALLERGIC RHINITIS 10/09/2007  . Arthritis    hips, spine  . DIABETES MELLITUS, UNCONTROLLED 03/16/2009  . FATIGUE 10/21/2008  . FIBROMYALGIA 10/09/2007  . GERD (gastroesophageal reflux disease)    diet  controlled, no meds  . H/O vaginal hysterectomy   . HYPERLIPIDEMIA 10/09/2007   diet controlled, no meds  . HYPERTENSION 10/09/2007  . Morton's neuroma   . OBSTRUCTIVE SLEEP APNEA 10/09/2007  . Plantar fasciitis    History - right  . Sleep apnea    Uses CPAP  . TRANSAMINASES, SERUM, ELEVATED 07/08/2009    Past Surgical History:  Procedure Laterality Date  . ABDOMINAL HYSTERECTOMY  1979   prolapsed uterus  . BASAL CELL CARCINOMA EXCISION    . COLONOSCOPY  2006   [facility]  . mamoplasty     reduction  . TONSILLECTOMY  1959  . TUBAL LIGATION  1976    There were no vitals filed for this visit.      Subjective Assessment - 10/10/16 0810    Subjective Pt reports feeling very stiff this morning. Has been very active this weekend and comliant with stretching. Pt golfed this weekend and focused on core, reports helped with pain and accuracy with game.    Pertinent History injection into Rt trochanteric bursa, fibromyalgia   Limitations Sitting;Walking   How long can you sit comfortably? in the  car is limited   How long can you walk comfortably? climbing steps and incline is limited due to pain.  Able to walk a mile today and had pain afterwards.   Diagnostic tests [place] of Rt hip  in MD office: Rt hip bursitis   Patient Stated Goals ascend/descend steps or incline with less pain, play golf with less pain, improve flexiblity and core strength   Currently in Pain? Yes   Pain Score 6    Pain Location Hip   Pain Orientation Right   Pain Descriptors / Indicators Tightness   Pain Type Chronic pain   Pain Frequency Constant                         [facility] Adult PT Treatment/Exercise - 10/10/16 0001      Lumbar Exercises: Stretches   Quad Stretch 2 reps;10 seconds  prone with strap     Lumbar Exercises: Aerobic   Stationary Bike Nustep level 5 x 8 minutes     Lumbar Exercises: Supine   Bent Knee Raise 10 reps  focus on core     Knee/Hip Exercises: Machines for Strengthening   Cybex Knee Extension 2x10 #50  seat 6     Knee/Hip Exercises: Standing   Hip Flexion Stengthening;Both;2 sets;10 reps  #2   Hip Abduction Stengthening;Both;2 sets;10 reps  #  2   Hip Extension Stengthening;Both;2 sets;10 reps  #2   Lateral Step Up Both;2 sets;10 reps;Hand Hold: 1;Step Height: 6"   Forward Step Up Limitations --  up/down stairs x 2 reciprocal   Other Standing Knee Exercises hamstring curl 2 x 10 2#                  PT Short Term Goals - 10/10/16 1610      PT SHORT TERM GOAL #1   Title be independent in initial HEP   Time 4   Period Weeks   Status Achieved     PT SHORT TERM GOAL #2   Title walk dog for exercise with < or = to 3/10 Rt hip pain   Time 4   Period Weeks   Status Achieved     PT SHORT TERM GOAL #3   Title report a 30% reduction in Rt hip pain with ascending steps or inclines   Time 4   Period Weeks   Status Achieved           PT Long Term Goals - 10/10/16 9604      PT LONG TERM GOAL #1   Title be independent in advanced HEP    Time 8   Period Weeks   Status On-going     PT LONG TERM GOAL #2   Title reduce FOTO to < or = to 37% limitation   Time 8   Period Weeks   Status On-going     PT LONG TERM GOAL #3   Title report a 60% reduction in Rt hip pain with ascending steps or inclines   Time 8   Period Weeks   Status On-going     PT LONG TERM GOAL #4   Title walk dog for exercise without increased Rt hip pain   Time 8   Period Weeks   Status On-going     PT LONG TERM GOAL #5   Title demonstrate and verbalize correct body mechanics for lumbar protection   Time 8   Period Weeks   Status On-going               Plan - 10/10/16 0830    Clinical Impression Statement Pt reports doing well over all. Walking has become easier but continues to have increased pain after about 1 mile. Pt reports pain with accending stair and hills. Pt able to tolerate increased weight with standing exercises. Needing some verbal cueing with stairs for better control. Pt will continue to benefit from skilled therapy for Bil LE and core strenghtening to return to prior level of function without pain.    Rehab Potential Good   PT Frequency 2x / week   PT Duration 8 weeks   PT Treatment/Interventions ADLs/Self Care Home Management;Cryotherapy;Electrical Stimulation;Functional mobility training;Stair training;Ultrasound;Moist Heat;Therapeutic activities;Traction;Therapeutic exercise;Neuromuscular re-education;Patient/family education;Passive range of motion;Manual techniques;Dry needling;Taping   PT Next Visit Plan Continue to progress standing therex and stairs, core strengthening and stabilization   Consulted and Agree with Plan of Care Patient      Patient will benefit from skilled therapeutic intervention in order to improve the following deficits and impairments:  Decreased range of motion, Pain, Decreased mobility, Impaired flexibility, Improper body mechanics, Decreased activity tolerance, Decreased endurance  Visit  Diagnosis: Pain in right hip  Chronic bilateral low back pain without sciatica  Stiffness of right hip, not elsewhere classified  Stiffness of left hip, not elsewhere classified     Problem List Patient Active Problem  List   Diagnosis Date Noted  . Degenerative disc disease, lumbar 09/19/2016  . Arthritis, midfoot 04/12/2016  . Tarsal tunnel syndrome of right side 03/17/2016  . Tibialis posterior tendon tear, nontraumatic 02/25/2016  . Biceps tendinitis on left 07/14/2015  . Tremor of left hand 07/14/2015  . Mild carpal tunnel syndrome of left wrist 06/23/2014  . Greater trochanteric bursitis of right hip 06/23/2014  . Obesity (BMI 30-39.9) 09/05/2013  . Asthmatic bronchitis 08/12/2013  . TRANSAMINASES, SERUM, ELEVATED 07/08/2009  . Diabetes type 2, uncontrolled ([place]) 03/16/2009  . FATIGUE 10/21/2008  . Hyperlipemia 10/09/2007  . Obstructive sleep apnea 10/09/2007  . Essential hypertension 10/09/2007  . ALLERGIC RHINITIS 10/09/2007  . FIBROMYALGIA 10/09/2007    Mikle Bosworth PTA 10/10/2016, 8:47 AM  [facility] [address], [place], [place], [address] Phone: [phone]   Fax:  [phone]  Name: Dana Dyer MRN: [id number] Date of Birth: [birth year]

## 2016-10-12 ENCOUNTER — Encounter: Payer: Self-pay | Admitting: Physical Therapy

## 2016-10-12 ENCOUNTER — Ambulatory Visit: Payer: Medicare Other | Admitting: Physical Therapy

## 2016-10-12 DIAGNOSIS — M25651 Stiffness of right hip, not elsewhere classified: Secondary | ICD-10-CM

## 2016-10-12 DIAGNOSIS — M25551 Pain in right hip: Secondary | ICD-10-CM

## 2016-10-12 DIAGNOSIS — M545 Low back pain, unspecified: Secondary | ICD-10-CM

## 2016-10-12 DIAGNOSIS — M25652 Stiffness of left hip, not elsewhere classified: Secondary | ICD-10-CM

## 2016-10-12 DIAGNOSIS — G8929 Other chronic pain: Secondary | ICD-10-CM

## 2016-10-12 NOTE — Therapy (Signed)
[facility] [address] [place], [place], [address] Phone: [phone]   Fax:  [phone]  Physical Therapy Treatment  Patient Details  Name: Dana Dyer MRN: [id number] Date of Birth: 1951-04-01 Referring Provider: Hulan Saas, MD  Encounter Date: 10/12/2016      PT End of Session - 10/12/16 1626    Visit Number 7   Number of Visits 10   Date for PT Re-Evaluation 11/16/16   PT Start Time 1623   PT Stop Time 1701   PT Time Calculation (min) 38 min   Activity Tolerance Patient tolerated treatment well   Behavior During Therapy [facility] for tasks assessed/performed      Past Medical History:  Diagnosis Date  . ALLERGIC RHINITIS 10/09/2007  . Arthritis    hips, spine  . DIABETES MELLITUS, UNCONTROLLED 03/16/2009  . FATIGUE 10/21/2008  . FIBROMYALGIA 10/09/2007  . GERD (gastroesophageal reflux disease)    diet  controlled, no meds  . H/O vaginal hysterectomy   . HYPERLIPIDEMIA 10/09/2007   diet controlled, no meds  . HYPERTENSION 10/09/2007  . Morton's neuroma   . OBSTRUCTIVE SLEEP APNEA 10/09/2007  . Plantar fasciitis    History - right  . Sleep apnea    Uses CPAP  . TRANSAMINASES, SERUM, ELEVATED 07/08/2009    Past Surgical History:  Procedure Laterality Date  . ABDOMINAL HYSTERECTOMY  1979   prolapsed uterus  . BASAL CELL CARCINOMA EXCISION    . COLONOSCOPY  2006   [facility]  . mamoplasty     reduction  . TONSILLECTOMY  1959  . TUBAL LIGATION  1976    There were no vitals filed for this visit.      Subjective Assessment - 10/12/16 1628    Subjective Patient reports feeling 80% in general and understanding what I need to do to get better.    Pertinent History injection into Rt trochanteric bursa, fibromyalgia   Limitations Sitting;Walking   How long can you sit comfortably? in the car is limited   How long can you walk comfortably? climbing steps and incline is limited due to pain.  Able to  walk a mile today and had pain afterwards.   Diagnostic tests [place] of Rt hip  in MD office: Rt hip bursitis   Patient Stated Goals ascend/descend steps or incline with less pain, play golf with less pain, improve flexiblity and core strength   Currently in Pain? Yes   Pain Score 6    Pain Location Hip   Pain Orientation Right   Pain Descriptors / Indicators Tightness   Pain Type Chronic pain   Pain Onset More than a month ago   Pain Frequency Constant   Aggravating Factors  ascending incline or steps, sitting too long, after playing golf   Pain Relieving Factors heat medication   Multiple Pain Sites No            [facility] PT Assessment - 10/12/16 0001      Strength   Overall Strength Comments right hip 4+/5, left 5/5, right hip ER 4+/5 and left 5/5; right hip abduction 4+/5 and left abduction 5/5                     [facility] Adult PT Treatment/Exercise - 10/12/16 0001      Lumbar Exercises: Stretches   Quad Stretch 2 reps;10 seconds  prone with strap     Lumbar Exercises: Machines for Strengthening   Leg Press bil.  2x10 50# seat 6     Lumbar Exercises: Supine   Clam 10 reps   Clam Limitations red band going slowly   Bent Knee Raise 10 reps  focus on core   Bridge 10 reps;5 seconds  VC to go slowly   Other Supine Lumbar Exercises hookly leg circles 5x each way for 2 sets     Knee/Hip Exercises: Standing   Hip Flexion Stengthening;Both;2 sets;10 reps  #2   Hip Abduction Stengthening;Both;2 sets;10 reps  #2   Hip Extension Stengthening;Both;2 sets;10 reps  #2   Forward Step Up Left;15 reps  right 10x but had hip pain   Forward Step Up Limitations --  up/down stairs x 2 reciprocal                PT Education - 10/12/16 1657    Education provided No          PT Short Term Goals - 10/10/16 0811      PT SHORT TERM GOAL #1   Title be independent in initial HEP   Time 4   Period Weeks   Status Achieved     PT SHORT TERM GOAL #2   Title  walk dog for exercise with < or = to 3/10 Rt hip pain   Time 4   Period Weeks   Status Achieved     PT SHORT TERM GOAL #3   Title report a 30% reduction in Rt hip pain with ascending steps or inclines   Time 4   Period Weeks   Status Achieved           PT Long Term Goals - 10/12/16 1633      PT LONG TERM GOAL #1   Title be independent in advanced HEP   Time 8   Period Weeks   Status On-going     PT LONG TERM GOAL #2   Title reduce FOTO to < or = to 37% limitation   Time 8   Period Weeks   Status On-going     PT LONG TERM GOAL #3   Title report a 60% reduction in Rt hip pain with ascending steps or inclines   Time 8   Period Weeks   Status Achieved     PT LONG TERM GOAL #4   Title walk dog for exercise without increased Rt hip pain   Time 8   Period Weeks   Status On-going               Plan - 10/12/16 1657    Clinical Impression Statement Patient strength of left hip is 5/5 and right is 4+/5. Patient has increased pain in right hip.  Patient needs verbal cues to go slowly and control. Patient will benefit from skilled therapy to increased strength and reduce hip pain.    Rehab Potential Good   PT Frequency 2x / week   PT Duration 8 weeks   PT Treatment/Interventions ADLs/Self Care Home Management;Cryotherapy;Electrical Stimulation;Functional mobility training;Stair training;Ultrasound;Moist Heat;Therapeutic activities;Traction;Therapeutic exercise;Neuromuscular re-education;Patient/family education;Passive range of motion;Manual techniques;Dry needling;Taping   PT Next Visit Plan Continue to progress standing therex and stairs, core strengthening and stabilization; work on HEP with theraband   PT Home Exercise Plan progress as needed   Recommended Other Services none   Consulted and Agree with Plan of Care Patient      Patient will benefit from skilled therapeutic intervention in order to improve the following deficits and impairments:  Decreased range  of motion, Pain,  Decreased mobility, Impaired flexibility, Improper body mechanics, Decreased activity tolerance, Decreased endurance  Visit Diagnosis: Pain in right hip  Chronic bilateral low back pain without sciatica  Stiffness of right hip, not elsewhere classified  Stiffness of left hip, not elsewhere classified     Problem List Patient Active Problem List   Diagnosis Date Noted  . Degenerative disc disease, lumbar 09/19/2016  . Arthritis, midfoot 04/12/2016  . Tarsal tunnel syndrome of right side 03/17/2016  . Tibialis posterior tendon tear, nontraumatic 02/25/2016  . Biceps tendinitis on left 07/14/2015  . Tremor of left hand 07/14/2015  . Mild carpal tunnel syndrome of left wrist 06/23/2014  . Greater trochanteric bursitis of right hip 06/23/2014  . Obesity (BMI 30-39.9) 09/05/2013  . Asthmatic bronchitis 08/12/2013  . TRANSAMINASES, SERUM, ELEVATED 07/08/2009  . Diabetes type 2, uncontrolled ([place]) 03/16/2009  . FATIGUE 10/21/2008  . Hyperlipemia 10/09/2007  . Obstructive sleep apnea 10/09/2007  . Essential hypertension 10/09/2007  . ALLERGIC RHINITIS 10/09/2007  . FIBROMYALGIA 10/09/2007    Earlie Counts, PT 10/12/16 5:02 PM   [facility] [address] [place], [place], [address] Phone: [phone]   Fax:  [phone]  Name: Dana Dyer MRN: [id number] Date of Birth: 01-14-1951

## 2016-10-15 NOTE — Progress Notes (Signed)
[facility] [place], [place] [address] Phone: [phone] Subjective:     CC: right hip pain f/u  EQA:[id number]  Dana Dyer is a 65 y.o. female coming in with complaint of right hip pain. Found to have greater trochanteric bursitis. Patient was given an injection 2 months ago for the greater trochanteric bursa. Patient only had mild response. Patient elected to continue conservative therapy but also including formal physical therapy. Patient at this point has gone 7 times. Feels it is made significant difference. Feeling better overall. Not having as much pain. Patient states that she still has some tightness but nothing radiating down the leg as much. Still has some difficulty when going up 6 or 7 stairs but states that 5 stairs no pain. No pain with regular daily activities. No pain at night .    Past Medical History:  Diagnosis Date  . ALLERGIC RHINITIS 10/09/2007  . Arthritis    hips, spine  . DIABETES MELLITUS, UNCONTROLLED 03/16/2009  . FATIGUE 10/21/2008  . FIBROMYALGIA 10/09/2007  . GERD (gastroesophageal reflux disease)    diet  controlled, no meds  . H/O vaginal hysterectomy   . HYPERLIPIDEMIA 10/09/2007   diet controlled, no meds  . HYPERTENSION 10/09/2007  . Morton's neuroma   . OBSTRUCTIVE SLEEP APNEA 10/09/2007  . Plantar fasciitis    History - right  . Sleep apnea    Uses CPAP  . TRANSAMINASES, SERUM, ELEVATED 07/08/2009   Past Surgical History:  Procedure Laterality Date  . ABDOMINAL HYSTERECTOMY  1979   prolapsed uterus  . BASAL CELL CARCINOMA EXCISION    . COLONOSCOPY  2006   Wiseman  . mamoplasty     reduction  . TONSILLECTOMY  1959  . TUBAL LIGATION  1976   Social History   Social History  . Marital status: Married    Spouse name: N/A  . Number of children: N/A  . Years of education: N/A   Social History Main Topics  . Smoking status: Never Smoker  . Smokeless tobacco: Never Used  . Alcohol use No    . Drug use: No  . Sexual activity: Yes    Birth control/ protection: Other-see comments, Post-menopausal     Comment: Hysterectomy   Other Topics Concern  . None   Social History Narrative  . None   Allergies  Allergen Reactions  . Amoxicillin-Pot Clavulanate     Severe diarrhea  . Codeine Sulfate     REACTION: "hyper"  . Crestor [Rosuvastatin Calcium] Other (See Comments)    myalgia  . Erythromycin Base   . Invokana [Canagliflozin] Other (See Comments)    Recurrent yeast infections  . Irbesartan-Hydrochlorothiazide   . Latex   . Victoza [Liraglutide] Nausea Only   Family History  Problem Relation Age of Onset  . Diabetes Mother   . Arthritis Mother   . Cancer Mother     breast  . Alcohol abuse Father   . Arthritis Father   . Hypertension Maternal Grandfather   . Miscarriages / Stillbirths Paternal Grandfather   . Colon polyps Neg Hx   . Colon cancer Neg Hx   . Esophageal cancer Neg Hx   . Rectal cancer Neg Hx   . Stomach cancer Neg Hx     Past medical history, social, surgical and family history all reviewed in electronic medical record.  No pertanent information unless stated regarding to the chief complaint.   Review of Systems: No headache, visual  changes, nausea, vomiting, diarrhea, constipation, dizziness, abdominal pain, skin rash, fevers, chills, night sweats, weight loss, swollen lymph nodes, body aches, joint swelling, muscle aches, chest pain, shortness of breath, mood changes.   Objective  Blood pressure 134/78, pulse 91, height 5' 3"  (1.6 m), weight 176 lb (79.8 kg), SpO2 95 %.  General: No apparent distress alert and oriented x3 mood and affect normal, dressed appropriately.  HEENT: Pupils equal, extraocular movements intact  Respiratory: Patient's speak in full sentences and does not appear short of breath  Cardiovascular: No lower extremity edema, non tender, no erythema  Skin: Warm dry intact with no signs of infection or rash on extremities or  on axial skeleton.  Abdomen: Soft nontender  Neuro: Cranial nerves II through XII are intact, neurovascularly intact in all extremities with 2+ DTRs and 2+ pulses.  Lymph: No lymphadenopathy of posterior or anterior cervical chain or axillae bilaterally.  Gait normal with good balance and coordination.  MSK:  Non tender with full range of motion and good stability and symmetric strength and tone of shoulders, elbows, wrist, hip, knee bilaterally. Arthritic changes of multiple joints  Back exam shows the patient does have some tightness but improved range of motion. Negative straight leg test patient still has some mild positive Faber test. Mild pain over the piriformis itself. No tenderness over the greater trochanteric bursae.      Impression and Recommendations:     This case required medical decision making of moderate complexity.      Note: This dictation was prepared with Dragon dictation along with smaller phrase technology. Any transcriptional errors that result from this process are unintentional.

## 2016-10-17 ENCOUNTER — Encounter: Payer: Self-pay | Admitting: Family Medicine

## 2016-10-17 ENCOUNTER — Ambulatory Visit (INDEPENDENT_AMBULATORY_CARE_PROVIDER_SITE_OTHER): Payer: Medicare Other | Admitting: Family Medicine

## 2016-10-17 ENCOUNTER — Encounter: Payer: Self-pay | Admitting: Physical Therapy

## 2016-10-17 ENCOUNTER — Ambulatory Visit: Payer: Medicare Other | Admitting: Physical Therapy

## 2016-10-17 DIAGNOSIS — G8929 Other chronic pain: Secondary | ICD-10-CM

## 2016-10-17 DIAGNOSIS — M7061 Trochanteric bursitis, right hip: Secondary | ICD-10-CM | POA: Diagnosis not present

## 2016-10-17 DIAGNOSIS — M25651 Stiffness of right hip, not elsewhere classified: Secondary | ICD-10-CM

## 2016-10-17 DIAGNOSIS — M25551 Pain in right hip: Secondary | ICD-10-CM | POA: Diagnosis not present

## 2016-10-17 DIAGNOSIS — M545 Low back pain, unspecified: Secondary | ICD-10-CM

## 2016-10-17 DIAGNOSIS — M25652 Stiffness of left hip, not elsewhere classified: Secondary | ICD-10-CM

## 2016-10-17 MED ORDER — MELOXICAM 15 MG PO TABS: 15.0000 mg | ORAL_TABLET | Freq: Every day | ORAL | 3 refills | Status: DC

## 2016-10-17 NOTE — Patient Instructions (Signed)
God to se eyou  Keep it up  I am proud of you for the blood sugars See me again in 6-8 weeks if not perfect

## 2016-10-17 NOTE — Therapy (Signed)
[facility] [address], [place], [place], [address] Phone: [phone]   Fax:  [phone]  Physical Therapy Treatment  Patient Details  Name: Dana Dyer MRN: [id number] Date of Birth: December 18, 1950 Referring Provider: Hulan Saas, MD  Encounter Date: 10/17/2016      PT End of Session - 10/17/16 1020    Visit Number 8   Number of Visits 10   Date for PT Re-Evaluation 11/16/16   PT Start Time 8127   PT Stop Time 1058   PT Time Calculation (min) 43 min   Activity Tolerance Patient tolerated treatment well   Behavior During Therapy [facility] for tasks assessed/performed      Past Medical History:  Diagnosis Date  . ALLERGIC RHINITIS 10/09/2007  . Arthritis    hips, spine  . DIABETES MELLITUS, UNCONTROLLED 03/16/2009  . FATIGUE 10/21/2008  . FIBROMYALGIA 10/09/2007  . GERD (gastroesophageal reflux disease)    diet  controlled, no meds  . H/O vaginal hysterectomy   . HYPERLIPIDEMIA 10/09/2007   diet controlled, no meds  . HYPERTENSION 10/09/2007  . Morton's neuroma   . OBSTRUCTIVE SLEEP APNEA 10/09/2007  . Plantar fasciitis    History - right  . Sleep apnea    Uses CPAP  . TRANSAMINASES, SERUM, ELEVATED 07/08/2009    Past Surgical History:  Procedure Laterality Date  . ABDOMINAL HYSTERECTOMY  1979   prolapsed uterus  . BASAL CELL CARCINOMA EXCISION    . COLONOSCOPY  2006   Wiseman  . mamoplasty     reduction  . TONSILLECTOMY  1959  . TUBAL LIGATION  1976    There were no vitals filed for this visit.      Subjective Assessment - 10/17/16 1018    Subjective Pt reports MD being pleased with PT progress. Pt reports continues to struggle some with stair climbing.   Currently in Pain? No/denies   Pain Score 0-No pain                         [facility] Adult PT Treatment/Exercise - 10/17/16 0001      Lumbar Exercises: Stretches   Quad Stretch 2 reps;10 seconds  prone with strap     Lumbar Exercises: Machines for Strengthening   Leg Press bil. 2x10 50# seat 6     Lumbar Exercises: Supine   Ab Set --   Clam 10 reps   Clam Limitations red band going slowly   Bent Knee Raise 10 reps  focus on core   Bridge 10 reps;5 seconds  VC to go slowly     Knee/Hip Exercises: Standing   Hip Flexion Stengthening;Both;2 sets;10 reps  yellow   Hip Abduction Stengthening;Both;2 sets;10 reps  yellow   Hip Extension Stengthening;Both;2 sets;10 reps  yellow   Lateral Step Up Both;2 sets;10 reps;Hand Hold: 1;Step Height: 6"   Forward Step Up Hand Hold: 1;Both;Step Height: 8"  VC for lifting/ not hopping   Forward Step Up Limitations --  on set on Bosu ball   Other Standing Knee Exercises Sit to stand 2x10                  PT Short Term Goals - 10/17/16 1020      PT SHORT TERM GOAL #1   Title be independent in initial HEP   Time 4   Period Weeks   Status Achieved           PT Long  Term Goals - 10/17/16 1020      PT LONG TERM GOAL #1   Title be independent in advanced HEP   Time 8   Period Weeks   Status On-going     PT LONG TERM GOAL #2   Title reduce FOTO to < or = to 37% limitation   Time 8   Period Weeks   Status On-going     PT LONG TERM GOAL #4   Title walk dog for exercise without increased Rt hip pain   Time 8   Period Weeks   Status On-going  Able to stretch when having pain     PT LONG TERM GOAL #5   Title demonstrate and verbalize correct body mechanics for lumbar protection   Time 8   Period Weeks   Status On-going               Plan - 10/17/16 1053    Clinical Impression Statement Pt reports being pleased with her progress so far. Had some difficulty with ascending 8' step needing verbal cues for lifting with Rt leg. Pt will continue to beenfit from skilled therapy for LE strengthening and stability to return to prior level of function without pain.    Rehab Potential Good   PT Frequency 2x / week   PT Duration 8  weeks   PT Treatment/Interventions ADLs/Self Care Home Management;Cryotherapy;Electrical Stimulation;Functional mobility training;Stair training;Ultrasound;Moist Heat;Therapeutic activities;Traction;Therapeutic exercise;Neuromuscular re-education;Patient/family education;Passive range of motion;Manual techniques;Dry needling;Taping   PT Next Visit Plan Continue to progress standing therex and stairs, core strengthening and stabilization; work on HEP with theraband   Consulted and Agree with Plan of Care Patient      Patient will benefit from skilled therapeutic intervention in order to improve the following deficits and impairments:  Decreased range of motion, Pain, Decreased mobility, Impaired flexibility, Improper body mechanics, Decreased activity tolerance, Decreased endurance  Visit Diagnosis: Pain in right hip  Chronic bilateral low back pain without sciatica  Stiffness of right hip, not elsewhere classified  Stiffness of left hip, not elsewhere classified     Problem List Patient Active Problem List   Diagnosis Date Noted  . Degenerative disc disease, lumbar 09/19/2016  . Arthritis, midfoot 04/12/2016  . Tarsal tunnel syndrome of right side 03/17/2016  . Tibialis posterior tendon tear, nontraumatic 02/25/2016  . Biceps tendinitis on left 07/14/2015  . Tremor of left hand 07/14/2015  . Mild carpal tunnel syndrome of left wrist 06/23/2014  . Greater trochanteric bursitis of right hip 06/23/2014  . Obesity (BMI 30-39.9) 09/05/2013  . Asthmatic bronchitis 08/12/2013  . TRANSAMINASES, SERUM, ELEVATED 07/08/2009  . Diabetes type 2, uncontrolled ([place]) 03/16/2009  . FATIGUE 10/21/2008  . Hyperlipemia 10/09/2007  . Obstructive sleep apnea 10/09/2007  . Essential hypertension 10/09/2007  . ALLERGIC RHINITIS 10/09/2007  . FIBROMYALGIA 10/09/2007    Mikle Bosworth PTA 10/17/2016, 11:12 AM  [facility] [address], [place], [place], [address] Phone: [phone]   Fax:  [phone]  Name: Dana Dyer MRN: [id number] Date of Birth: 09/27/1951

## 2016-10-17 NOTE — Assessment & Plan Note (Signed)
Patient seems to be doing well.   Continue conservative therapy at this time. We discussed the patient is done with physical therapy. Follow-up with me again on an as-needed basis as long as patient does well.

## 2016-10-19 ENCOUNTER — Encounter: Payer: Medicare Other | Admitting: Physical Therapy

## 2016-10-24 ENCOUNTER — Encounter: Payer: Self-pay | Admitting: Family Medicine

## 2016-10-26 ENCOUNTER — Telehealth: Payer: Self-pay | Admitting: Family Medicine

## 2016-10-26 NOTE — Telephone Encounter (Signed)
Unable to release information due to not having consent.

## 2016-10-26 NOTE — Telephone Encounter (Signed)
° ° °  Crystal with [employer] diabetes case manager call to ask for pt last AC1 and bp along with the dates they were done   [phone] ext [phone]   Can fax info to  [phone]

## 2016-10-31 ENCOUNTER — Ambulatory Visit: Payer: Medicare Other | Admitting: Physical Therapy

## 2016-11-02 ENCOUNTER — Ambulatory Visit: Payer: Medicare Other | Admitting: Physical Therapy

## 2016-11-02 ENCOUNTER — Encounter: Payer: Self-pay | Admitting: Physical Therapy

## 2016-11-02 ENCOUNTER — Telehealth: Payer: Self-pay | Admitting: Physical Therapy

## 2016-11-02 DIAGNOSIS — M25551 Pain in right hip: Secondary | ICD-10-CM

## 2016-11-02 DIAGNOSIS — M545 Low back pain: Secondary | ICD-10-CM

## 2016-11-02 DIAGNOSIS — M25651 Stiffness of right hip, not elsewhere classified: Secondary | ICD-10-CM

## 2016-11-02 DIAGNOSIS — M25652 Stiffness of left hip, not elsewhere classified: Secondary | ICD-10-CM

## 2016-11-02 DIAGNOSIS — G8929 Other chronic pain: Secondary | ICD-10-CM

## 2016-11-02 NOTE — Therapy (Addendum)
[facility] [address], [place], [place], [address] Phone: [phone]   Fax:  [phone]  Physical Therapy Treatment  Patient Details  Name: Dana Dyer MRN: [id number] Date of Birth: Apr 19, 1951 Referring Provider: Hulan Saas, MD  Encounter Date: 11/02/2016      PT End of Session - 11/02/16 0941    Visit Number 9   Number of Visits 10   Date for PT Re-Evaluation 11/16/16   PT Start Time 0937   PT Stop Time 1010   PT Time Calculation (min) 33 min   Activity Tolerance Patient tolerated treatment well   Behavior During Therapy [facility] for tasks assessed/performed      Past Medical History:  Diagnosis Date  . ALLERGIC RHINITIS 10/09/2007  . Arthritis    hips, spine  . DIABETES MELLITUS, UNCONTROLLED 03/16/2009  . FATIGUE 10/21/2008  . FIBROMYALGIA 10/09/2007  . GERD (gastroesophageal reflux disease)    diet  controlled, no meds  . H/O vaginal hysterectomy   . HYPERLIPIDEMIA 10/09/2007   diet controlled, no meds  . HYPERTENSION 10/09/2007  . Morton's neuroma   . OBSTRUCTIVE SLEEP APNEA 10/09/2007  . Plantar fasciitis    History - right  . Sleep apnea    Uses CPAP  . TRANSAMINASES, SERUM, ELEVATED 07/08/2009    Past Surgical History:  Procedure Laterality Date  . ABDOMINAL HYSTERECTOMY  1979   prolapsed uterus  . BASAL CELL CARCINOMA EXCISION    . COLONOSCOPY  2006   Wiseman  . mamoplasty     reduction  . TONSILLECTOMY  1959  . TUBAL LIGATION  1976    There were no vitals filed for this visit.      Subjective Assessment - 11/02/16 0939    Subjective Pt reports hip doing much better. Traveled to [place] for holiday and did very well.    Pertinent History injection into Rt trochanteric bursa, fibromyalgia   Limitations Sitting;Walking   How long can you sit comfortably? in the car is limited   How long can you walk comfortably? climbing steps and incline is limited due to pain.  Able to walk  a mile today and had pain afterwards.   Diagnostic tests [place] of Rt hip  in MD office: Rt hip bursitis   Patient Stated Goals ascend/descend steps or incline with less pain, play golf with less pain, improve flexiblity and core strength   Currently in Pain? No/denies            [facility] PT Assessment - 11/02/16 0001      Assessment   Medical Diagnosis spinal stenosis of lumbar region, unspecified whenther neurogenic claudication present   Referring Provider Hulan Saas, MD   Onset Date/Surgical Date 06/21/16   Next MD Visit 10/2016   Prior Therapy none     Precautions   Precautions None     Restrictions   Weight Bearing Restrictions No     Balance Screen   Has the patient fallen in the past 6 months No   Has the patient had a decrease in activity level because of a fear of falling?  No   Is the patient reluctant to leave their home because of a fear of falling?  No     Home Environment   Living Environment Private residence   Type of [place] to enter   Entrance Stairs-Number of Steps 3   [place] to live on main level with bedroom/bathroom;Two  level     Prior Function   Level of Independence Independent   Vocation Retired   Leisure walking, Building control surveyor   Overall Cognitive Status Within Functional Limits for tasks assessed     Observation/Other Assessments   Focus on Therapeutic Outcomes (FOTO)  28%     AROM   Overall AROM  Within functional limits for tasks performed   Overall AROM Comments No limitations     Strength   Overall Strength Comments right hip 4+/5, left 5/5, right hip ER 4+/5 and left 5/5; right hip abduction 4+/5 and left abduction 5/5                     [facility] Adult PT Treatment/Exercise - 11/02/16 0001      Lumbar Exercises: Stretches   Quad Stretch 2 reps;10 seconds  prone with strap   Piriformis Stretch 2 reps;10 seconds     Lumbar Exercises: Aerobic   Stationary Bike Nustep L3 x8  minutes  Therapist present to disucss treatment     Lumbar Exercises: Machines for Strengthening   Leg Press bil. 2x10 50# seat 6     Lumbar Exercises: Supine   Bridge 5 seconds;20 reps  VC to go slowly     Knee/Hip Exercises: Standing   Forward Step Up Hand Hold: 1;Both;Step Height: 8"  VC for lifting/ not hopping   Forward Step Up Limitations --  on set on Bosu ball   Other Standing Knee Exercises Sit to stand 2x10                  PT Short Term Goals - 11/02/16 0941      PT SHORT TERM GOAL #1   Title be independent in initial HEP   Time 4   Period Weeks   Status Achieved     PT SHORT TERM GOAL #2   Title walk dog for exercise with < or = to 3/10 Rt hip pain   Time 4   Period Weeks   Status Achieved     PT SHORT TERM GOAL #3   Title report a 30% reduction in Rt hip pain with ascending steps or inclines   Time 4   Period Weeks   Status Achieved           PT Long Term Goals - 11/02/16 0941      PT LONG TERM GOAL #1   Title be independent in advanced HEP   Time 8   Period Weeks   Status Achieved     PT LONG TERM GOAL #2   Title reduce FOTO to < or = to 37% limitation   Time 8   Period Weeks   Status Achieved  28%     PT LONG TERM GOAL #3   Title report a 60% reduction in Rt hip pain with ascending steps or inclines   Time 8   Period Weeks   Status Achieved     PT LONG TERM GOAL #4   Title walk dog for exercise without increased Rt hip pain   Time 8   Period Weeks   Status Achieved     PT LONG TERM GOAL #5   Title demonstrate and verbalize correct body mechanics for lumbar protection   Time 8   Period Weeks   Status Achieved               Plan - 11/02/16 1002    Clinical Impression Statement  Pt reports hips feeling much better. Pt states she is ready to discontinue therapy and begin exercising independently at home. Pt has met all short term and long term goals.  Will discharge patient from therapy.      Patient will  benefit from skilled therapeutic intervention in order to improve the following deficits and impairments:     Visit Diagnosis: Pain in right hip  Chronic bilateral low back pain without sciatica  Stiffness of right hip, not elsewhere classified  Stiffness of left hip, not elsewhere classified       G-Codes - 11/14/2016 1006    Functional Assessment Tool Used FOTO: 28% limitation   Functional Limitation Other PT primary   Other PT Primary Goal Status (V4862) At least 20 percent but less than 40 percent impaired, limited or restricted   Other PT Primary Discharge Status (Y2417) At least 20 percent but less than 40 percent impaired, limited or restricted      Problem List Patient Active Problem List   Diagnosis Date Noted  . Degenerative disc disease, lumbar 09/19/2016  . Arthritis, midfoot 04/12/2016  . Tarsal tunnel syndrome of right side 03/17/2016  . Tibialis posterior tendon tear, nontraumatic 02/25/2016  . Biceps tendinitis on left 07/14/2015  . Tremor of left hand 07/14/2015  . Mild carpal tunnel syndrome of left wrist 06/23/2014  . Greater trochanteric bursitis of right hip 06/23/2014  . Obesity (BMI 30-39.9) 09/05/2013  . Asthmatic bronchitis 08/12/2013  . TRANSAMINASES, SERUM, ELEVATED 07/08/2009  . Diabetes type 2, uncontrolled ([place]) 03/16/2009  . FATIGUE 10/21/2008  . Hyperlipemia 10/09/2007  . Obstructive sleep apnea 10/09/2007  . Essential hypertension 10/09/2007  . ALLERGIC RHINITIS 10/09/2007  . FIBROMYALGIA 10/09/2007    Mikle Bosworth, PTA 11-14-16 10:13 AM  PHYSICAL THERAPY DISCHARGE SUMMARY  Visits from Start of Care: 9  Current functional level related to goals / functional outcomes: See above for current status.  Pt will discharge to HEP secondary to improvement of symptoms.     Remaining deficits: No significant deficits remain.     Education / Equipment: HEP, Economist  Plan: Patient agrees to discharge.  Patient goals were met.  Patient is being discharged due to meeting the stated rehab goals.  ?????  Sigurd Sos, PT 11/14/16 10:18 AM       [facility] [address], [place], [place], [address] Phone: [phone]   Fax:  [phone]  Name: SHANIRA TINE MRN: [id number] Date of Birth: 02-24-1951

## 2016-11-12 ENCOUNTER — Other Ambulatory Visit: Payer: Self-pay | Admitting: Family Medicine

## 2016-12-06 ENCOUNTER — Other Ambulatory Visit: Payer: Self-pay | Admitting: Emergency Medicine

## 2016-12-06 ENCOUNTER — Other Ambulatory Visit: Payer: Self-pay | Admitting: Family Medicine

## 2016-12-06 MED ORDER — INSULIN GLARGINE 100 UNIT/ML SOLOSTAR PEN: PEN_INJECTOR | SUBCUTANEOUS | 5 refills | Status: DC

## 2016-12-06 NOTE — Telephone Encounter (Signed)
Spoke with pt to confirm the units supposed to be taking and pt said 70 units. I looked back in the last note and she is correct. Sent new prescription in to pharmacy.

## 2016-12-06 NOTE — Telephone Encounter (Signed)
Pt request refill  LANTUS SOLOSTAR 100 UNIT/ML Solostar Pen  Old rx is for 56 units, however pt states new rx is supposed to be 72 units.  Needs today if possible  [facility] [address]

## 2016-12-07 ENCOUNTER — Ambulatory Visit: Payer: Medicare Other | Admitting: Family Medicine

## 2016-12-27 ENCOUNTER — Other Ambulatory Visit: Payer: Self-pay | Admitting: Family Medicine

## 2016-12-29 ENCOUNTER — Other Ambulatory Visit (INDEPENDENT_AMBULATORY_CARE_PROVIDER_SITE_OTHER): Payer: Medicare Other

## 2016-12-29 DIAGNOSIS — E1165 Type 2 diabetes mellitus with hyperglycemia: Secondary | ICD-10-CM

## 2016-12-29 DIAGNOSIS — Z794 Long term (current) use of insulin: Secondary | ICD-10-CM | POA: Diagnosis not present

## 2016-12-29 DIAGNOSIS — IMO0002 Reserved for concepts with insufficient information to code with codable children: Secondary | ICD-10-CM

## 2016-12-29 DIAGNOSIS — E1142 Type 2 diabetes mellitus with diabetic polyneuropathy: Secondary | ICD-10-CM

## 2016-12-29 DIAGNOSIS — E785 Hyperlipidemia, unspecified: Secondary | ICD-10-CM | POA: Diagnosis not present

## 2016-12-29 LAB — LIPID PANEL
CHOL/HDL RATIO: 4
Cholesterol: 252 mg/dL — ABNORMAL HIGH (ref 0–200)
HDL: 62.4 mg/dL (ref >39.00)
LDL Cholesterol: 161 mg/dL — ABNORMAL HIGH (ref 0–99)
NONHDL: 190.01
Triglycerides: 145 mg/dL (ref 0.0–149.0)
VLDL: 29 mg/dL (ref 0.0–40.0)

## 2016-12-29 LAB — HEMOGLOBIN A1C: Hgb A1c MFr Bld: 10.9 % — ABNORMAL HIGH (ref 4.6–6.5)

## 2016-12-30 ENCOUNTER — Encounter: Payer: Self-pay | Admitting: Family Medicine

## 2016-12-30 NOTE — Progress Notes (Unsigned)
lab

## 2017-01-03 ENCOUNTER — Ambulatory Visit (INDEPENDENT_AMBULATORY_CARE_PROVIDER_SITE_OTHER): Payer: Medicare Other | Admitting: Family Medicine

## 2017-01-03 VITALS — BP 138/80 | HR 93 | Ht 63.0 in | Wt 185.0 lb

## 2017-01-03 DIAGNOSIS — I10 Essential (primary) hypertension: Secondary | ICD-10-CM

## 2017-01-03 DIAGNOSIS — E1142 Type 2 diabetes mellitus with diabetic polyneuropathy: Secondary | ICD-10-CM

## 2017-01-03 DIAGNOSIS — IMO0002 Reserved for concepts with insufficient information to code with codable children: Secondary | ICD-10-CM

## 2017-01-03 DIAGNOSIS — E1165 Type 2 diabetes mellitus with hyperglycemia: Secondary | ICD-10-CM

## 2017-01-03 DIAGNOSIS — E785 Hyperlipidemia, unspecified: Secondary | ICD-10-CM | POA: Diagnosis not present

## 2017-01-03 DIAGNOSIS — Z794 Long term (current) use of insulin: Secondary | ICD-10-CM | POA: Diagnosis not present

## 2017-01-03 MED ORDER — DULAGLUTIDE 0.75 MG/0.5ML ~~LOC~~ SOAJ: 0.7500 mg | SUBCUTANEOUS | 11 refills | Status: DC

## 2017-01-03 NOTE — Progress Notes (Signed)
Pre visit review using our clinic review tool, if applicable. No additional management support is needed unless otherwise documented below in the visit note. 

## 2017-01-03 NOTE — Progress Notes (Signed)
Subjective:     Patient ID: Dana Dyer, female   DOB: August 14, 1951, 66 y.o.   MRN: [id number]  HPI Follow-up type 2 diabetes. She has history of poor compliance- especially over the holidays. She's not been exercising and very poor compliance with diet. She is currently on metformin and takes Lantus 70 units daily. She has had intolerance with invokana with yeast infections. She tried Victoza but had severe nausea. She tried Trulicity but only took this for 1 week and had some mild nausea and discontinued. She was on Humalog at one point, but apparently did not see much improvement in sugars -though she never titrated. She is interested in giving another trial with trulicity if possible  Recent lipids very poorly controlled. She's been intolerant of Crestor and Lipitor secondary to myalgias. We had prescribed Livalo previously but she never took this  Past Medical History:  Diagnosis Date  . ALLERGIC RHINITIS 10/09/2007  . Arthritis    hips, spine  . DIABETES MELLITUS, UNCONTROLLED 03/16/2009  . FATIGUE 10/21/2008  . FIBROMYALGIA 10/09/2007  . GERD (gastroesophageal reflux disease)    diet  controlled, no meds  . H/O vaginal hysterectomy   . HYPERLIPIDEMIA 10/09/2007   diet controlled, no meds  . HYPERTENSION 10/09/2007  . Morton's neuroma   . OBSTRUCTIVE SLEEP APNEA 10/09/2007  . Plantar fasciitis    History - right  . Sleep apnea    Uses CPAP  . TRANSAMINASES, SERUM, ELEVATED 07/08/2009   Past Surgical History:  Procedure Laterality Date  . ABDOMINAL HYSTERECTOMY  1979   prolapsed uterus  . BASAL CELL CARCINOMA EXCISION    . COLONOSCOPY  2006   Wiseman  . mamoplasty     reduction  . TONSILLECTOMY  1959  . TUBAL LIGATION  1976    reports that she has never smoked. She has never used smokeless tobacco. She reports that she does not drink alcohol or use drugs. family history includes Alcohol abuse in her father; Arthritis in her father and mother; Cancer in her mother; Diabetes in  her mother; Hypertension in her maternal grandfather; Miscarriages / [place] in her paternal grandfather. Allergies  Allergen Reactions  . Amoxicillin-Pot Clavulanate     Severe diarrhea  . Codeine Sulfate     REACTION: "hyper"  . Crestor [Rosuvastatin Calcium] Other (See Comments)    myalgia  . Erythromycin Base   . Invokana [Canagliflozin] Other (See Comments)    Recurrent yeast infections  . Irbesartan-Hydrochlorothiazide   . Latex   . Victoza [Liraglutide] Nausea Only     Review of Systems  Constitutional: Negative for fatigue.  Eyes: Negative for visual disturbance.  Respiratory: Negative for cough, chest tightness, shortness of breath and wheezing.   Cardiovascular: Negative for chest pain, palpitations and leg swelling.  Neurological: Negative for dizziness, seizures, syncope, weakness, light-headedness and headaches.       Objective:   Physical Exam  Constitutional: She appears well-developed and well-nourished.  Eyes: Pupils are equal, round, and reactive to light.  Neck: Neck supple. No JVD present. No thyromegaly present.  Cardiovascular: Normal rate and regular rhythm.  Exam reveals no gallop.   Pulmonary/Chest: Effort normal and breath sounds normal. No respiratory distress. She has no wheezes. She has no rales.  Musculoskeletal: She exhibits no edema.  Neurological: She is alert.       Assessment:     # 1 type 2 diabetes poorly controlled. Previous intolerance with invokana and GLP-1 (Victoza)  #2 hypertension which is stable and  fairly well controlled  #3 dyslipidemia with previous intolerance to Lipitor and Crestor  #4 poor compliance with diet and exercise.    Plan:     -Try to get back on Livalo 2 mg once daily. She has bottle at home -Repeat lipids in about 8-12 weeks -Start trulicity 9.38 mg once weekly -Continue Lantus and metformin -Step up compliance with diet and exercise -Repeat A1c in 3 months -may need addition of mealtime  Novolog or Humalog soon.  Eulas Post MD [facility]

## 2017-01-03 NOTE — Patient Instructions (Signed)
Diabetes Mellitus and Exercise Exercising regularly is important for your overall health, especially when you have diabetes (diabetes mellitus). Exercising is not only about losing weight. It has many health benefits, such as increasing muscle strength and bone density and reducing body fat and stress. This leads to improved fitness, flexibility, and endurance, all of which result in better overall health. Exercise has additional benefits for people with diabetes, including:  Reducing appetite.  Helping to lower and control blood glucose.  Lowering blood pressure.  Helping to control amounts of fatty substances (lipids) in the blood, such as cholesterol and triglycerides.  Helping the body to respond better to insulin (improving insulin sensitivity).  Reducing how much insulin the body needs.  Decreasing the risk for heart disease by:  Lowering cholesterol and triglyceride levels.  Increasing the levels of good cholesterol.  Lowering blood glucose levels. What is my activity plan? Your health care provider or certified diabetes educator can help you make a plan for the type and frequency of exercise (activity plan) that works for you. Make sure that you:  Do at least 150 minutes of moderate-intensity or vigorous-intensity exercise each week. This could be brisk walking, biking, or water aerobics.  Do stretching and strength exercises, such as yoga or weightlifting, at least 2 times a week.  Spread out your activity over at least 3 days of the week.  Get some form of physical activity every day.  Do not go more than 2 days in a row without some kind of physical activity.  Avoid being inactive for more than 90 minutes at a time. Take frequent breaks to walk or stretch.  Choose a type of exercise or activity that you enjoy, and set realistic goals.  Start slowly, and gradually increase the intensity of your exercise over time. What do I need to know about managing my  diabetes?  Check your blood glucose before and after exercising.  If your blood glucose is higher than 240 mg/dL (13.3 mmol/L) before you exercise, check your urine for ketones. If you have ketones in your urine, do not exercise until your blood glucose returns to normal.  Know the symptoms of low blood glucose (hypoglycemia) and how to treat it. Your risk for hypoglycemia increases during and after exercise. Common symptoms of hypoglycemia can include:  Hunger.  Anxiety.  Sweating and feeling clammy.  Confusion.  Dizziness or feeling light-headed.  Increased heart rate or palpitations.  Blurry vision.  Tingling or numbness around the mouth, lips, or tongue.  Tremors or shakes.  Irritability.  Keep a rapid-acting carbohydrate snack available before, during, and after exercise to help prevent or treat hypoglycemia.  Avoid injecting insulin into areas of the body that are going to be exercised. For example, avoid injecting insulin into:  The arms, when playing tennis.  The legs, when jogging.  Keep records of your exercise habits. Doing this can help you and your health care provider adjust your diabetes management plan as needed. Write down:  Food that you eat before and after you exercise.  Blood glucose levels before and after you exercise.  The type and amount of exercise you have done.  When your insulin is expected to peak, if you use insulin. Avoid exercising at times when your insulin is peaking.  When you start a new exercise or activity, work with your health care provider to make sure the activity is safe for you, and to adjust your insulin, medicines, or food intake as needed.  Drink plenty   of water while you exercise to prevent dehydration or heat stroke. Drink enough fluid to keep your urine clear or pale yellow. This information is not intended to replace advice given to you by your health care provider. Make sure you discuss any questions you have with  your health care provider. Document Released: 02/11/2004 Document Revised: 06/10/2016 Document Reviewed: 05/02/2016 Elsevier Interactive Patient Education  2017 Elsevier Inc.  

## 2017-01-31 ENCOUNTER — Telehealth: Payer: Self-pay | Admitting: Family Medicine

## 2017-01-31 NOTE — Telephone Encounter (Signed)
Pt would like to increase her dosage of Dulaglutide (TRULICITY) 5.59 RC/1.6LA SOPN  Pt states blood sugars are better, but not where they should be.   [id number] [place], [place] [address]

## 2017-02-01 NOTE — Telephone Encounter (Signed)
Left message for patient to call back  

## 2017-02-02 NOTE — Telephone Encounter (Signed)
Last OV 01/03/2017 Need to ask to a few questions:  1: what are her BS readings?  2: is she taking all her medications correctly?  3: if she is concerned and her BS readings are  Elevated then the best option for her would be to schedule office visit to discuss.

## 2017-02-02 NOTE — Telephone Encounter (Signed)
FYI--tried calling pt back again with NA.

## 2017-02-03 MED ORDER — DULAGLUTIDE 1.5 MG/0.5ML ~~LOC~~ SOAJ: 1.5000 mg | SUBCUTANEOUS | 12 refills | Status: DC

## 2017-02-03 NOTE — Telephone Encounter (Signed)
Spoke with patient. 1. Her fasting AM glucose readings have ranged from 170 - 140. 2. She is taking all of her medication as prescribed. 3.  She would like to know if she can increase her Trulicity?  If her glucose readings then do not decrease, she will make an appointment.

## 2017-02-03 NOTE — Telephone Encounter (Signed)
If no nausea, go ahead and increase Trulicity to 1.5 mg once weely.

## 2017-02-03 NOTE — Telephone Encounter (Signed)
Patient states that she is able to control the nausea and that it has improved.  New Rx sent.

## 2017-02-09 ENCOUNTER — Other Ambulatory Visit: Payer: Self-pay | Admitting: Family Medicine

## 2017-03-12 ENCOUNTER — Other Ambulatory Visit: Payer: Self-pay | Admitting: Family Medicine

## 2017-03-21 ENCOUNTER — Ambulatory Visit (INDEPENDENT_AMBULATORY_CARE_PROVIDER_SITE_OTHER): Payer: Medicare Other | Admitting: Family Medicine

## 2017-03-21 ENCOUNTER — Encounter: Payer: Self-pay | Admitting: Family Medicine

## 2017-03-21 VITALS — BP 130/80 | HR 85 | Temp 98.4°F | Ht 63.0 in | Wt 181.5 lb

## 2017-03-21 DIAGNOSIS — IMO0002 Reserved for concepts with insufficient information to code with codable children: Secondary | ICD-10-CM

## 2017-03-21 DIAGNOSIS — E785 Hyperlipidemia, unspecified: Secondary | ICD-10-CM | POA: Diagnosis not present

## 2017-03-21 DIAGNOSIS — Z794 Long term (current) use of insulin: Secondary | ICD-10-CM | POA: Diagnosis not present

## 2017-03-21 DIAGNOSIS — E1165 Type 2 diabetes mellitus with hyperglycemia: Secondary | ICD-10-CM | POA: Diagnosis not present

## 2017-03-21 DIAGNOSIS — E1142 Type 2 diabetes mellitus with diabetic polyneuropathy: Secondary | ICD-10-CM | POA: Diagnosis not present

## 2017-03-21 DIAGNOSIS — I10 Essential (primary) hypertension: Secondary | ICD-10-CM | POA: Diagnosis not present

## 2017-03-21 LAB — POCT GLYCOSYLATED HEMOGLOBIN (HGB A1C): Hemoglobin A1C: 10

## 2017-03-21 LAB — HEPATIC FUNCTION PANEL
ALK PHOS: 114 U/L (ref 39–117)
ALT: 48 U/L — AB (ref 0–35)
AST: 50 U/L — ABNORMAL HIGH (ref 0–37)
Albumin: 3.9 g/dL (ref 3.5–5.2)
BILIRUBIN DIRECT: 0.3 mg/dL (ref 0.0–0.3)
BILIRUBIN TOTAL: 1.4 mg/dL — AB (ref 0.2–1.2)
Total Protein: 6.9 g/dL (ref 6.0–8.3)

## 2017-03-21 LAB — LIPID PANEL
CHOL/HDL RATIO: 4
CHOLESTEROL: 199 mg/dL (ref 0–200)
HDL: 51.5 mg/dL (ref >39.00)
LDL CALC: 121 mg/dL — AB (ref 0–99)
NonHDL: 147.62
Triglycerides: 132 mg/dL (ref 0.0–149.0)
VLDL: 26.4 mg/dL (ref 0.0–40.0)

## 2017-03-21 NOTE — Progress Notes (Signed)
Subjective:     Patient ID: Dana Dyer, female   DOB: 1951-08-01, 66 y.o.   MRN: [id number]  HPI Here for medical follow-up. She is also scheduled for Medicare Subsequent annual wellness visit with our health coach.  She has history of obesity, type 2 diabetes, dyslipidemia, obstructive sleep apnea, hypertension. History of poor compliance with exercise. She recently started doing some exercise classes 2 days per week. She walks her dog some for exercise.  Long history of poorly controlled diabetes. Last A1c was 10.9%. We increased her trulicity to 1.5 mg but she has not actually started that yet. She also remains on metformin and also on Lantus 72 units daily and takes Humalog 10 units 3 times daily with meals. She has been somewhat inconsistent with her use of Humalog. She cannot tolerate invokana secondary to yeast infections. Recent fasting blood sugars have been ranging between 122 and 160 range  Hypertension has been well controlled. Denies any dizziness or headaches. No chest pains.  She is currently taking Livalo for hyperlipidemia. She cannot tolerate other statins. She has been able to take the statin without any myalgias. She has been very consistent with use. Her lipids last fall were elevated with cholesterol 252 and LDL 161.  Past Medical History:  Diagnosis Date  . ALLERGIC RHINITIS 10/09/2007  . Arthritis    hips, spine  . DIABETES MELLITUS, UNCONTROLLED 03/16/2009  . FATIGUE 10/21/2008  . FIBROMYALGIA 10/09/2007  . GERD (gastroesophageal reflux disease)    diet  controlled, no meds  . H/O vaginal hysterectomy   . HYPERLIPIDEMIA 10/09/2007   diet controlled, no meds  . HYPERTENSION 10/09/2007  . Morton's neuroma   . OBSTRUCTIVE SLEEP APNEA 10/09/2007  . Plantar fasciitis    History - right  . Sleep apnea    Uses CPAP  . TRANSAMINASES, SERUM, ELEVATED 07/08/2009   Past Surgical History:  Procedure Laterality Date  . ABDOMINAL HYSTERECTOMY  1979   prolapsed uterus  .  BASAL CELL CARCINOMA EXCISION    . COLONOSCOPY  2006   [facility]  . mamoplasty     reduction  . TONSILLECTOMY  1959  . TUBAL LIGATION  1976    reports that she has never smoked. She has never used smokeless tobacco. She reports that she does not drink alcohol or use drugs. family history includes Alcohol abuse in her father; Arthritis in her father and mother; Cancer in her mother; Diabetes in her mother; Hypertension in her maternal grandfather; Miscarriages / [place] in her paternal grandfather. Allergies  Allergen Reactions  . Amoxicillin-Pot Clavulanate     Severe diarrhea  . Codeine Sulfate     REACTION: "hyper"  . Crestor [Rosuvastatin Calcium] Other (See Comments)    myalgia  . Erythromycin Base   . Invokana [Canagliflozin] Other (See Comments)    Recurrent yeast infections  . Irbesartan-Hydrochlorothiazide   . Latex   . Victoza [Liraglutide] Nausea Only     Review of Systems  Constitutional: Negative for fatigue.  Eyes: Negative for visual disturbance.  Respiratory: Negative for cough, chest tightness, shortness of breath and wheezing.   Cardiovascular: Negative for chest pain, palpitations and leg swelling.  Endocrine: Negative for polydipsia and polyuria.  Genitourinary: Negative for dysuria.  Neurological: Negative for dizziness, seizures, syncope, weakness, light-headedness and headaches.       Objective:   Physical Exam  Constitutional: She is oriented to person, place, and time. She appears well-developed and well-nourished.  HENT:  Head: Normocephalic and atraumatic.  Eyes:  EOM are normal. Pupils are equal, round, and reactive to light.  Neck: Normal range of motion. Neck supple. No thyromegaly present.  Cardiovascular: Normal rate, regular rhythm and normal heart sounds.   No murmur heard. Pulmonary/Chest: Breath sounds normal. No respiratory distress. She has no wheezes. She has no rales.  Abdominal: Soft. Bowel sounds are normal.  Musculoskeletal:  Normal range of motion. She exhibits no edema.  Lymphadenopathy:    She has no cervical adenopathy.  Neurological: She is alert and oriented to person, place, and time.  Skin:  Feet reveal no skin lesions. Good distal foot pulses. Good capillary refill. No calluses. Normal sensation with monofilament testing   Psychiatric: She has a normal mood and affect. Her behavior is normal. Judgment and thought content normal.       Assessment:     #1 hypertension stable and at goal  #2 dyslipidemia. Patient's had prior intolerance to multiple statins but has been able to tolerate Livalo without any difficulty  #3 type 2 diabetes with history of poor control. A1c today 10.0%    Plan:     -Increased trulicity to 1.5 mg once weekly and be in touch if nausea becomes a problem -We have encouraged her to check some 2 hour postprandial blood sugars. If she is consistently getting over 180 increase her Humalog to 15 units 3 times a day with meals -She is encouraged to lose weight and step up her exercise. We have suggested calorie tracking application to assist with this -Routine follow-up in 3 months -check lipids and hepatic today.  Eulas Post MD [facility] at [facility]

## 2017-03-21 NOTE — Progress Notes (Signed)
Subjective:   Dana Dyer is a 66 y.o. female who presents for an Initial Medicare Annual Wellness Visit.  HRA assessment completed during this visit with Dana Dyer  The Patient was informed that the wellness visit is to identify future health risk and educate and initiate measures that can reduce risk for increased disease through the lifespan.    NO ROS; Medicare Wellness Visit Last OV:  completed today  Labs completed: and reviewed with information on her numbers   Describes health as fair, good or great? Good Has Diabetes but not as controlled Teaches classes in essential oils Good relationships    Social History   Social History  . Marital status: Married    Spouse name: N/A  . Number of children: N/A  . Years of education: N/A   Occupational History  . Not on file.   Social History Main Topics  . Smoking status: Never Smoker  . Smokeless tobacco: Never Used  . Alcohol use No  . Drug use: No  . Sexual activity: Yes    Birth control/ protection: Other-see comments, Post-menopausal     Comment: Hysterectomy   Other Topics Concern  . Not on file   Social History Narrative  . No narrative on file    Update:  Tobacco: no or smoking  How many drinks do you have per week? None  Medications reviewed insulin and med  Discussed and given a log to start tracking her BS am and a few times post meals; as well as tracking diet. Agreed to go the [facility] for more education.   BMI: 32  Discussed weight loss and the benefit of losing 10lbs. Exercise is relatively good; stress is much better and feeling much better  Diet;  A1c was 10.9  BS A1c is 10  Chol 252; LDL 161 Has a lot of gum surgery   Nutritional counseling given: Discussed and referred  Teeth or Denture issues? Have just had dental worked done and feeling much better   Exercise;   Took up golf; plays once a week last summer  Plans to start playing golf again  Goes to  [employer]; has fun doing this Works on [employer] with 2 dogs; walk 3 miles a week and walks at least 1 mile with her dogs every day    [place];  Fall hx; no  Noticed a slight change in balance   Given education on "Fall Prevention in the Home" for more safety tips the patient can apply as appropriate.    Diabetic eye exam 09/29/2016 Hearing Screening Comments: Appears normal  Vision Screening Comments: Goes one time a year for glaucoma check Dr. Cena Benton in with Dr. 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  Any emotional problems? Anxious, depressed, irritable, sad or blue? no Denies feeling depressed or hopeless; voices pleasure in daily life  Pain: no pain at present; does some stretches which keeps her bursitis at bay   Cognitive;  Manages checkbook, medications; no failures of task Ad8 score reviewed for issues;  Issues making decisions; no  Less interest in hobbies / activities" no  Repeats questions, stories; family complaining: NO  Trouble  using ordinary gadgets; microwave; computer: no  Forgets the month or year: no  Mismanaging finances: team management   Missing apt: no but does write them down  Daily problems with thinking of memory NO Ad8 score is 0   Any dizziness when standing up?  no Sleep pattern changes; states she is sleeping well currently. Has learned tools that help her    Advanced Directive addressed; Completed   Counseling Health Maintenance Gaps: Hep C; not a candidate per Dr. Elease Hashimoto; education given on medicare screen.  Foot exam  Dr Elease Hashimoto checked foot  Colonoscopy; 11/2015 - due 11/2018 Mammogram: 05/2016 will plan to schedule later this year  Bone Density  02/2013 Normal   Immunizations Due: (Vaccines reviewed and educated regarding any overdue)    Individual Goal: to stay mentally happy and enjoying life.    Health Recommendations and Referrals to the Diabetes Nutritional center     Cardiac Risk Factors include: advanced age (>25mn, >>49women);diabetes mellitus;dyslipidemia;hypertension;obesity (BMI >30kg/m2)     Objective:    Today's Vitals   03/21/17 0826  BP: 130/80  Pulse: 85  Temp: 98.4 F (36.9 C)  TempSrc: Oral  SpO2: 98%  Weight: 181 lb 8 oz (82.3 kg)  Height: 5' 3"  (1.6 m)   Body mass index is 32.15 kg/m.   Current Medications (verified) Outpatient Encounter Prescriptions as of 03/21/2017  Medication Sig  . amLODipine (NORVASC) 5 MG tablet TAKE 1 TABLET BY MOUTH EVERY DAY  . BD ULTRA-FINE PEN NEEDLES 29G X 12.7MM MISC USE AS DIRECTED 3 TIMES DAILY.  . Dulaglutide (TRULICITY) 1.5 MIR/4.4RXSOPN Inject 1.5 mg into the skin once a week.  . escitalopram (LEXAPRO) 10 MG tablet TAKE ONE TABLET BY MOUTH ONE TIME DAILY  . HUMALOG KWIKPEN 100 UNIT/ML KiwkPen INJECT 5 UNITS WITH LUNCH AND 5 UNITS WITH SUPPER AS DIRECTED  . Insulin Glargine (LANTUS SOLOSTAR) 100 UNIT/ML Solostar Pen INJECT 70 UNITS DAILY AS DIRECTED  . losartan-hydrochlorothiazide (HYZAAR) 100-25 MG tablet TAKE 1 TABLET BY MOUTH ONCE DAILY  . meloxicam (MOBIC) 15 MG tablet Take 1 tablet (15 mg total) by mouth daily. (Patient taking differently: Take 15 mg by mouth as needed. )  . metFORMIN (GLUCOPHAGE) 1000 MG tablet TAKE 1 TABLET BY MOUTH TWICE A DAY  . ONETOUCH DELICA LANCETS 354MMISC CHECK 3 TIMES DAILY.  .Marland KitchenONETOUCH VERIO test strip TEST 3 TIMES DAILY  . Pitavastatin Calcium (LIVALO) 2 MG TABS Take 1 tablet by mouth.  . tretinoin (RETIN-A) 0.025 % cream APPLY PEA SIZED AMOUNT NIGHTLY   Facility-Administered Encounter Medications as of 03/21/2017  Medication  . TDaP (BOOSTRIX) injection 0.5 mL    Allergies (verified) Amoxicillin-pot clavulanate;  Codeine sulfate; Crestor [rosuvastatin calcium]; Erythromycin base; Invokana [canagliflozin]; Irbesartan-hydrochlorothiazide; Latex; and Victoza [liraglutide]   History: Past Medical History:  Diagnosis Date  . ALLERGIC RHINITIS 10/09/2007  . Arthritis    hips, spine  . DIABETES MELLITUS, UNCONTROLLED 03/16/2009  . FATIGUE 10/21/2008  . FIBROMYALGIA 10/09/2007  . GERD (gastroesophageal reflux disease)    diet  controlled, no meds  . H/O vaginal hysterectomy   . HYPERLIPIDEMIA 10/09/2007   diet controlled, no meds  . HYPERTENSION 10/09/2007  . Morton's neuroma   . OBSTRUCTIVE SLEEP APNEA 10/09/2007  . Plantar fasciitis    History - right  . Sleep apnea    Uses CPAP  . TRANSAMINASES, SERUM, ELEVATED 07/08/2009   Past Surgical History:  Procedure Laterality Date  . ABDOMINAL HYSTERECTOMY  1979   prolapsed uterus  .  BASAL CELL CARCINOMA EXCISION    . COLONOSCOPY  2006   [facility]  . mamoplasty     reduction  . TONSILLECTOMY  1959  . TUBAL LIGATION  1976   Family History  Problem Relation Age of Onset  . Diabetes Mother   . Arthritis Mother   . Cancer Mother     breast  . Alcohol abuse Father   . Arthritis Father   . Hypertension Maternal Grandfather   . Miscarriages / Stillbirths Paternal Grandfather   . Colon polyps Neg Hx   . Colon cancer Neg Hx   . Esophageal cancer Neg Hx   . Rectal cancer Neg Hx   . Stomach cancer Neg Hx    Social History   Occupational History  . Not on file.   Social History Main Topics  . Smoking status: Never Smoker  . Smokeless tobacco: Never Used  . Alcohol use No  . Drug use: No  . Sexual activity: Yes    Birth control/ protection: Other-see comments, Post-menopausal     Comment: Hysterectomy    Tobacco Counseling Counseling given: Yes   Activities of Daily Living In your present state of health, do you have any difficulty performing the following activities: 03/21/2017  Hearing? N  Vision? N  Difficulty concentrating or making  decisions? N  Walking or climbing stairs? N  Dressing or bathing? N  Doing errands, shopping? N  Preparing Food and eating ? N  Using the Toilet? N  In the past six months, have you accidently leaked urine? N  Do you have problems with loss of bowel control? N  Managing your Medications? N  Managing your Finances? N  Housekeeping or managing your Housekeeping? N  Some recent data might be hidden    Immunizations and Health Maintenance Immunization History  Administered Date(s) Administered  . Influenza Split 12/15/2011, 09/25/2012  . Influenza Whole 08/30/2010  . Influenza, High Dose Seasonal PF 09/01/2015, 09/19/2016  . Influenza,inj,Quad PF,36+ Mos 08/06/2013, 09/22/2014  . Pneumococcal Conjugate-13 12/08/2011, 09/01/2015  . Pneumococcal Polysaccharide-23 10/04/2016  . Td 12/05/1998  . Tdap 12/15/2011  . Zoster 01/24/2013   Health Maintenance Due  Topic Date Due  . FOOT EXAM  08/31/2016    Patient Care Team: Eulas Post, MD as PCP - General  Indicate any recent Medical Services you may have received from other than [facility] providers in the past year (date may be approximate).     Assessment:   This is a routine wellness examination for Zahraa.   Hearing/Vision screen Hearing Screening Comments: Appears normal  Vision Screening Comments: Goes one time a year for glaucoma check Dr. Cena Benton in with Dr. Katy Fitch No diabetic retinopathy  Due again in the fall   Dietary issues and exercise activities discussed: Current Exercise Habits: Home exercise routine;Structured exercise class, Type of exercise: strength training/weights;walking, Time (Minutes): 60, Frequency (Times/Week): 4, Weekly Exercise (Minutes/Week): 240, Intensity: Moderate  Goals    . patient          To be mentally happy and stay active in explore different choices     . Reduce calorie intake to 2000 calories per day          Check out  online nutrition programs as GumSearch.nl and  http://vang.com/; fit65m;  Look for foods with "whole" wheat; bran; oatmeal etc Shot at the farmer's markets in season for fresher choices  Watch for "hydrogenated" on the label of oils which are trans-fats.  Watch for "high fructose corn  syrup" in snacks, yogurt or ketchup  Meats have less marbling; bright colored fruits and vegetables;  Canned; dump out liquid and wash vegetables. Be mindful of what we are eating  Portion control is essential to a health weight! Sit down; take a break and enjoy your meal; take smaller bites; put the fork down between bites;  It takes 20 minutes to get full; so check in with your fullness cues and stop eating when you start to fill full             Depression Screen PHQ 2/9 Scores 03/21/2017 03/10/2016 01/12/2015  PHQ - 2 Score 0 0 0    Fall Risk Fall Risk  03/21/2017 03/10/2016 01/12/2015  Falls in the past year? No No No    Cognitive Function: MMSE - Mini Mental State Exam 03/21/2017  Not completed: (No Data)    Ad8 score 0; waived Cognitive screen Engaged; social; active; no issues with independent living     Screening Tests Health Maintenance  Topic Date Due  . FOOT EXAM  08/31/2016  . HEMOGLOBIN A1C  06/28/2017  . INFLUENZA VACCINE  07/05/2017  . OPHTHALMOLOGY EXAM  09/29/2017  . MAMMOGRAM  05/11/2018  . COLONOSCOPY  11/15/2018  . TETANUS/TDAP  12/14/2021  . DEXA SCAN  Completed  . PNA vac Low Risk Adult  Completed      Plan:    PCP Notes  Health Maintenance Up to date Dr. Elease Hashimoto did foot exam per Ms. [place]  Is not an candidate for Hep c and took this out or her labs; per Dr. Elease Hashimoto   Abnormal Screens A1c elevated   Referrals to the diabetes and nutritional center   Patient concerns; to stay healthy mentally and physically   Nurse Concerns; Getting a1c under control and discussed options. Agreed the consultation with the Diabetes and Nutrition center would be helpful; also given tracking log for BS ac and pc  with meal tracking. Will increase exercise;  Also mentioned the Type 2 DM class on Monday at 6pm for walk in's    Next PCP apt in 3 months    During the course of the visit, Kayani was educated and counseled about the following appropriate screening and preventive services:   Vaccines to include Pneumoccal, Influenza, Hepatitis B, Td, Zostavax, HCV  Electrocardiogram  Cardiovascular disease screening  Colorectal cancer screening  Bone density screening - normal   Diabetes screening ongoing   Glaucoma screening ongoing  Mammography/ annual   Nutrition counseling  Smoking cessation counseling n/a  Patient Instructions (the written plan) were given to the patient.    SKAJG,OTLXB, RN   03/21/2017   Agree with assessment as above.  Eulas Post MD [facility] at [facility]

## 2017-03-21 NOTE — Patient Instructions (Addendum)
Go ahead and increase Trulicity to 1.5 mg dose Check some 2 hour postprandial blood sugars If consistently > 180 consider increasing Humalog up to 15 units.   Dana Dyer , Thank you for taking time to come for your Medicare Wellness Visit. I appreciate your ongoing commitment to your health goals. Please review the following plan we discussed and let me know if I can assist you in the future.   I took your hepatitis c out of the system   These are the goals we discussed: Goals    . patient          To be mentally happy and stay active in explore different choices     . Reduce calorie intake to 2000 calories per day          Check out  online nutrition programs as GumSearch.nl and http://vang.com/; fit48m;  Look for foods with "whole" wheat; bran; oatmeal etc Shot at the farmer's markets in season for fresher choices  Watch for "hydrogenated" on the label of oils which are trans-fats.  Watch for "high fructose corn syrup" in snacks, yogurt or ketchup  Meats have less marbling; bright colored fruits and vegetables;  Canned; dump out liquid and wash vegetables. Be mindful of what we are eating  Portion control is essential to a health weight! Sit down; take a break and enjoy your meal; take smaller bites; put the fork down between bites;  It takes 20 minutes to get full; so check in with your fullness cues and stop eating when you start to fill full              This is a list of the screening recommended for you and due dates:  Health Maintenance  Topic Date Due  . Complete foot exam   08/31/2016  . Hemoglobin A1C  06/28/2017  . Flu Shot  07/05/2017  . Eye exam for diabetics  09/29/2017  . Mammogram  05/11/2018  . Colon Cancer Screening  11/15/2018  . Tetanus Vaccine  12/14/2021  . DEXA scan (bone density measurement)  Completed  . Pneumonia vaccines  Completed   Health Maintenance, Female Adopting a healthy lifestyle and getting preventive care can go a long  way to promote health and wellness. Talk with your health care provider about what schedule of regular examinations is right for you. This is a good chance for you to check in with your provider about disease prevention and staying healthy. In between checkups, there are plenty of things you can do on your own. Experts have done a lot of research about which lifestyle changes and preventive measures are most likely to keep you healthy. Ask your health care provider for more information. Weight and diet Eat a healthy diet  Be sure to include plenty of vegetables, fruits, low-fat dairy products, and lean protein.  Do not eat a lot of foods high in solid fats, added sugars, or salt.  Get regular exercise. This is one of the most important things you can do for your health.  Most adults should exercise for at least 150 minutes each week. The exercise should increase your heart rate and make you sweat (moderate-intensity exercise).  Most adults should also do strengthening exercises at least twice a week. This is in addition to the moderate-intensity exercise. Maintain a healthy weight  Body mass index (BMI) is a measurement that can be used to identify possible weight problems. It estimates body fat based on height and weight. Your health  care provider can help determine your BMI and help you achieve or maintain a healthy weight.  For females 70 years of age and older:  A BMI below 18.5 is considered underweight.  A BMI of 18.5 to 24.9 is normal.  A BMI of 25 to 29.9 is considered overweight.  A BMI of 30 and above is considered obese. Watch levels of cholesterol and blood lipids  You should start having your blood tested for lipids and cholesterol at 68 years of age, then have this test every 5 years.  You may need to have your cholesterol levels checked more often if:  Your lipid or cholesterol levels are high.  You are older than 68 years of age.  You are at high risk for heart  disease. Cancer screening Lung Cancer  Lung cancer screening is recommended for adults 39-1 years old who are at high risk for lung cancer because of a history of smoking.  A yearly low-dose CT scan of the lungs is recommended for people who:  Currently smoke.  Have quit within the past 15 years.  Have at least a 30-pack-year history of smoking. A pack year is smoking an average of one pack of cigarettes a day for 1 year.  Yearly screening should continue until it has been 15 years since you quit.  Yearly screening should stop if you develop a health problem that would prevent you from having lung cancer treatment. Breast Cancer  Practice breast self-awareness. This means understanding how your breasts normally appear and feel.  It also means doing regular breast self-exams. Let your health care provider know about any changes, no matter how small.  If you are in your 20s or 30s, you should have a clinical breast exam (CBE) by a health care provider every 1-3 years as part of a regular health exam.  If you are 63 or older, have a CBE every year. Also consider having a breast X-ray (mammogram) every year.  If you have a family history of breast cancer, talk to your health care provider about genetic screening.  If you are at high risk for breast cancer, talk to your health care provider about having an MRI and a mammogram every year.  Breast cancer gene (BRCA) assessment is recommended for women who have family members with BRCA-related cancers. BRCA-related cancers include:  Breast.  Ovarian.  Tubal.  Peritoneal cancers.  Results of the assessment will determine the need for genetic counseling and BRCA1 and BRCA2 testing. Cervical Cancer  Your health care provider may recommend that you be screened regularly for cancer of the pelvic organs (ovaries, uterus, and vagina). This screening involves a pelvic examination, including checking for microscopic changes to the surface  of your cervix (Pap test). You may be encouraged to have this screening done every 3 years, beginning at age 76.  For women ages 75-65, health care providers may recommend pelvic exams and Pap testing every 3 years, or they may recommend the Pap and pelvic exam, combined with testing for human papilloma virus (HPV), every 5 years. Some types of HPV increase your risk of cervical cancer. Testing for HPV may also be done on women of any age with unclear Pap test results.  Other health care providers may not recommend any screening for nonpregnant women who are considered low risk for pelvic cancer and who do not have symptoms. Ask your health care provider if a screening pelvic exam is right for you.  If you have had past treatment  for cervical cancer or a condition that could lead to cancer, you need Pap tests and screening for cancer for at least 20 years after your treatment. If Pap tests have been discontinued, your risk factors (such as having a new sexual partner) need to be reassessed to determine if screening should resume. Some women have medical problems that increase the chance of getting cervical cancer. In these cases, your health care provider may recommend more frequent screening and Pap tests. Colorectal Cancer  This type of cancer can be detected and often prevented.  Routine colorectal cancer screening usually begins at 68 years of age and continues through 68 years of age.  Your health care provider may recommend screening at an earlier age if you have risk factors for colon cancer.  Your health care provider may also recommend using home test kits to check for hidden blood in the stool.  A small camera at the end of a tube can be used to examine your colon directly (sigmoidoscopy or colonoscopy). This is done to check for the earliest forms of colorectal cancer.  Routine screening usually begins at age 20.  Direct examination of the colon should be repeated every 5-10 years  through 68 years of age. However, you may need to be screened more often if early forms of precancerous polyps or small growths are found. Skin Cancer  Check your skin from head to toe regularly.  Tell your health care provider about any new moles or changes in moles, especially if there is a change in a mole's shape or color.  Also tell your health care provider if you have a mole that is larger than the size of a pencil eraser.  Always use sunscreen. Apply sunscreen liberally and repeatedly throughout the day.  Protect yourself by wearing long sleeves, pants, a wide-brimmed hat, and sunglasses whenever you are outside. Heart disease, diabetes, and high blood pressure  High blood pressure causes heart disease and increases the risk of stroke. High blood pressure is more likely to develop in:  People who have blood pressure in the high end of the normal range (130-139/85-89 mm Hg).  People who are overweight or obese.  People who are African American.  If you are 20-79 years of age, have your blood pressure checked every 3-5 years. If you are 70 years of age or older, have your blood pressure checked every year. You should have your blood pressure measured twice-once when you are at a hospital or clinic, and once when you are not at a hospital or clinic. Record the average of the two measurements. To check your blood pressure when you are not at a hospital or clinic, you can use:  An automated blood pressure machine at a pharmacy.  A home blood pressure monitor.  If you are between 54 years and 52 years old, ask your health care provider if you should take aspirin to prevent strokes.  Have regular diabetes screenings. This involves taking a blood sample to check your fasting blood sugar level.  If you are at a normal weight and have a low risk for diabetes, have this test once every three years after 68 years of age.  If you are overweight and have a high risk for diabetes, consider  being tested at a younger age or more often. Preventing infection Hepatitis B  If you have a higher risk for hepatitis B, you should be screened for this virus. You are considered at high risk for hepatitis B if:  You were born in a country where hepatitis B is common. Ask your health care provider which countries are considered high risk.  Your parents were born in a high-risk country, and you have not been immunized against hepatitis B (hepatitis B vaccine).  You have HIV or AIDS.  You use needles to inject street drugs.  You live with someone who has hepatitis B.  You have had sex with someone who has hepatitis B.  You get hemodialysis treatment.  You take certain medicines for conditions, including cancer, organ transplantation, and autoimmune conditions. Hepatitis C  Blood testing is recommended for:  Everyone born from [birth year] through 1967.  Anyone with known risk factors for hepatitis C. Sexually transmitted infections (STIs)  You should be screened for sexually transmitted infections (STIs) including gonorrhea and chlamydia if:  You are sexually active and are younger than 68 years of age.  You are older than 68 years of age and your health care provider tells you that you are at risk for this type of infection.  Your sexual activity has changed since you were last screened and you are at an increased risk for chlamydia or gonorrhea. Ask your health care provider if you are at risk.  If you do not have HIV, but are at risk, it may be recommended that you take a prescription medicine daily to prevent HIV infection. This is called pre-exposure prophylaxis (PrEP). You are considered at risk if:  You are sexually active and do not regularly use condoms or know the HIV status of your partner(s).  You take drugs by injection.  You are sexually active with a partner who has HIV. Talk with your health care provider about whether you are at high risk of being infected with  HIV. If you choose to begin PrEP, you should first be tested for HIV. You should then be tested every 3 months for as long as you are taking PrEP. Pregnancy  If you are premenopausal and you may become pregnant, ask your health care provider about preconception counseling.  If you may become pregnant, take 400 to 800 micrograms (mcg) of folic acid every day.  If you want to prevent pregnancy, talk to your health care provider about birth control (contraception). Osteoporosis and menopause  Osteoporosis is a disease in which the bones lose minerals and strength with aging. This can result in serious bone fractures. Your risk for osteoporosis can be identified using a bone density scan.  If you are 4 years of age or older, or if you are at risk for osteoporosis and fractures, ask your health care provider if you should be screened.  Ask your health care provider whether you should take a calcium or vitamin D supplement to lower your risk for osteoporosis.  Menopause may have certain physical symptoms and risks.  Hormone replacement therapy may reduce some of these symptoms and risks. Talk to your health care provider about whether hormone replacement therapy is right for you. Follow these instructions at home:  Schedule regular health, dental, and eye exams.  Stay current with your immunizations.  Do not use any tobacco products including cigarettes, chewing tobacco, or electronic cigarettes.  If you are pregnant, do not drink alcohol.  If you are breastfeeding, limit how much and how often you drink alcohol.  Limit alcohol intake to no more than 1 drink per day for nonpregnant women. One drink equals 12 ounces of beer, 5 ounces of wine, or 1 ounces of hard liquor.  Do  not use street drugs.  Do not share needles.  Ask your health care provider for help if you need support or information about quitting drugs.  Tell your health care provider if you often feel depressed.  Tell  your health care provider if you have ever been abused or do not feel safe at home. This information is not intended to replace advice given to you by your health care provider. Make sure you discuss any questions you have with your health care provider. Document Released: 06/06/2011 Document Revised: 04/28/2016 Document Reviewed: 08/25/2015 Elsevier Interactive Patient Education  2017 [place] Prevention in the Home Falls can cause injuries and can affect people from all age groups. There are many simple things that you can do to make your home safe and to help prevent falls. What can I do on the outside of my home?  Regularly repair the edges of walkways and driveways and fix any cracks.  Remove high doorway thresholds.  Trim any shrubbery on the main path into your home.  Use bright outdoor lighting.  Clear walkways of debris and clutter, including tools and rocks.  Regularly check that handrails are securely fastened and in good repair. Both sides of any steps should have handrails.  Install guardrails along the edges of any raised decks or porches.  Have leaves, snow, and ice cleared regularly.  Use sand or salt on walkways during winter months.  In the garage, clean up any spills right away, including grease or oil spills. What can I do in the bathroom?  Use night lights.  Install grab bars by the toilet and in the tub and shower. Do not use towel bars as grab bars.  Use non-skid mats or decals on the floor of the tub or shower.  If you need to sit down while you are in the shower, use a plastic, non-slip stool.  Keep the floor dry. Immediately clean up any water that spills on the floor.  Remove soap buildup in the tub or shower on a regular basis.  Attach bath mats securely with double-sided non-slip rug tape.  Remove throw rugs and other tripping hazards from the floor. What can I do in the bedroom?  Use night lights.  Make sure that a bedside  light is easy to reach.  Do not use oversized bedding that drapes onto the floor.  Have a firm chair that has side arms to use for getting dressed.  Remove throw rugs and other tripping hazards from the floor. What can I do in the kitchen?  Clean up any spills right away.  Avoid walking on wet floors.  Place frequently used items in easy-to-reach places.  If you need to reach for something above you, use a sturdy step stool that has a grab bar.  Keep electrical cables out of the way.  Do not use floor polish or wax that makes floors slippery. If you have to use wax, make sure that it is non-skid floor wax.  Remove throw rugs and other tripping hazards from the floor. What can I do in the stairways?  Do not leave any items on the stairs.  Make sure that there are handrails on both sides of the stairs. Fix handrails that are broken or loose. Make sure that handrails are as long as the stairways.  Check any carpeting to make sure that it is firmly attached to the stairs. Fix any carpet that is loose or worn.  Avoid having throw rugs  at the top or bottom of stairways, or secure the rugs with carpet tape to prevent them from moving.  Make sure that you have a light switch at the top of the stairs and the bottom of the stairs. If you do not have them, have them installed. What are some other fall prevention tips?  Wear closed-toe shoes that fit well and support your feet. Wear shoes that have rubber soles or low heels.  When you use a stepladder, make sure that it is completely opened and that the sides are firmly locked. Have someone hold the ladder while you are using it. Do not climb a closed stepladder.  Add color or contrast paint or tape to grab bars and handrails in your home. Place contrasting color strips on the first and last steps.  Use mobility aids as needed, such as canes, walkers, scooters, and crutches.  Turn on lights if it is dark. Replace any light bulbs that  burn out.  Set up furniture so that there are clear paths. Keep the furniture in the same spot.  Fix any uneven floor surfaces.  Choose a carpet design that does not hide the edge of steps of a stairway.  Be aware of any and all pets.  Review your medicines with your healthcare provider. Some medicines can cause dizziness or changes in blood pressure, which increase your risk of falling. Talk with your health care provider about other ways that you can decrease your risk of falls. This may include working with a physical therapist or trainer to improve your strength, balance, and endurance. This information is not intended to replace advice given to you by your health care provider. Make sure you discuss any questions you have with your health care provider. Document Released: 11/11/2002 Document Revised: 04/19/2016 Document Reviewed: 12/26/2014 Elsevier Interactive Patient Education  2017 [employer].

## 2017-03-21 NOTE — Progress Notes (Signed)
Pre visit review using our clinic review tool, if applicable. No additional management support is needed unless otherwise documented below in the visit note. 

## 2017-03-31 NOTE — Telephone Encounter (Signed)
Encounter made in error.  Mikle Bosworth, PTA 03/31/17 8:15 AM

## 2017-04-01 ENCOUNTER — Other Ambulatory Visit: Payer: Self-pay | Admitting: Family Medicine

## 2017-04-10 ENCOUNTER — Other Ambulatory Visit: Payer: Self-pay | Admitting: Family Medicine

## 2017-04-10 DIAGNOSIS — Z1231 Encounter for screening mammogram for malignant neoplasm of breast: Secondary | ICD-10-CM

## 2017-04-24 ENCOUNTER — Ambulatory Visit: Payer: Medicare Other | Admitting: Registered"

## 2017-05-11 ENCOUNTER — Encounter: Payer: Self-pay | Admitting: Registered"

## 2017-05-11 ENCOUNTER — Encounter: Payer: Medicare Other | Attending: Family Medicine | Admitting: Registered"

## 2017-05-11 DIAGNOSIS — Z794 Long term (current) use of insulin: Secondary | ICD-10-CM | POA: Diagnosis not present

## 2017-05-11 DIAGNOSIS — E1142 Type 2 diabetes mellitus with diabetic polyneuropathy: Secondary | ICD-10-CM | POA: Insufficient documentation

## 2017-05-11 DIAGNOSIS — Z713 Dietary counseling and surveillance: Secondary | ICD-10-CM | POA: Insufficient documentation

## 2017-05-11 DIAGNOSIS — E118 Type 2 diabetes mellitus with unspecified complications: Secondary | ICD-10-CM

## 2017-05-11 DIAGNOSIS — E1165 Type 2 diabetes mellitus with hyperglycemia: Secondary | ICD-10-CM | POA: Diagnosis not present

## 2017-05-11 NOTE — Progress Notes (Signed)
Diabetes Self-Management Education  Visit Type: First/Initial  Appt. Start Time: 0920 Appt. End Time: 8309  05/11/2017  Dana Dyer, identified by name and date of birth, is a 66 y.o. female with a diagnosis of Diabetes: Type 2.   ASSESSMENT Taking flaxseed because of constipation, thinks trulicity causes constipation. Tries to eat a healthy diet. Likes fruit, not enough greens, cuts back on portion sizes. Doesn't like milk boiled eggs, gets pains from lettuce diverticulitis, salmon causes upset stomach.  Gets in a cycle of having highs for several days at a time     Diabetes Self-Management Education - 05/11/17 0932      Visit Information   Visit Type First/Initial     Initial Visit   Diabetes Type Type 2   Are you currently following a meal plan? No  tries to eat health   Are you taking your medications as prescribed? Yes   Date Diagnosed 2006     Health Coping   How would you rate your overall health? Excellent     Psychosocial Assessment   Patient Belief/Attitude about Diabetes Defeat/Burnout   How often do you need to have someone help you when you read instructions, pamphlets, or other written materials from your doctor or pharmacy? 1 - Never   What is the last grade level you completed in school? BS Human Development     Complications   Last HgB A1C per patient/outside source 10 %  per pt   How often do you check your blood sugar? 1-2 times/day   Fasting Blood glucose range (mg/dL) --  170-200s   Number of hypoglycemic episodes per month 0   Have you had a dilated eye exam in the past 12 months? Yes   Have you had a dental exam in the past 12 months? Yes   Are you checking your feet? Yes   How many days per week are you checking your feet? 7     Dietary Intake   Breakfast cereal raisin bran, quaker squares, banana with 1% or skim milk, sometimes 3 slices bacon   Snack (morning) quacker bars   Lunch PB, a little jelly on low sugar bread, diet soda OR 1/2  apple with PB OR [place], cheese sandwich   Snack (afternoon) nuts (be careful) OR small cup of ice cream   Dinner 1/2 sandwich or cup of soup (cramps from milk products) OR cereal if haven't had for breakfast OR mama tacos grilled trout fish tacos OR chili with [place],   Snack (evening) none OR peaches (has high readings next morning)   Beverage(s) occassionally coffee, water, diet coke,     Exercise   Exercise Type Moderate (swimming / aerobic walking)  1 day golf, chair aerobics 2 days   How many days per week to you exercise? 3   How many minutes per day do you exercise? 45   Total minutes per week of exercise 135     Patient Education   Previous Diabetes Education Yes (please comment)  12 yrs ago   Disease state  Definition of diabetes, type 1 and 2, and the diagnosis of diabetes;Factors that contribute to the development of diabetes   Nutrition management  Role of diet in the treatment of diabetes and the relationship between the three main macronutrients and blood glucose level;Food label reading, portion sizes and measuring food.;Carbohydrate counting;Reviewed blood glucose goals for pre and post meals and how to evaluate the patients' food intake on their blood glucose level.  Physical activity and exercise  Role of exercise on diabetes management, blood pressure control and cardiac health.   Monitoring Identified appropriate SMBG and/or A1C goals.   Acute complications Taught treatment of hypoglycemia - the 15 rule.   Chronic complications Relationship between chronic complications and blood glucose control;Assessed and discussed foot care and prevention of foot problems     Individualized Goals (developed by patient)   Nutrition General guidelines for healthy choices and portions discussed   Physical Activity Exercise 5-7 days per week     Outcomes   Expected Outcomes Demonstrated interest in learning. Expect positive outcomes   Future DMSE PRN   Program Status Completed      Individualized Plan for Diabetes Self-Management Training:   Learning Objective:  Patient will have a greater understanding of diabetes self-management. Patient education plan is to attend individual and/or group sessions per assessed needs and concerns.  Patient Instructions  Plan:  Aim for 2-3 Carb Choices per meal (30-45 grams)   Aim for 0-15 Carbs per snack if hungry  Include protein in moderation with your meals and snacks Consider reading food labels for Total Carbohydrate  Consider increasing your activity daily as tolerated Consider checking BG at alternate times per day as directed by MD Continue taking medication as directed by MD When having the quaker bar snack consider including a protein source.   Expected Outcomes:  Demonstrated interest in learning. Expect positive outcomes  Education material provided: Living Well with Diabetes, Meal plan card, My Plate, Snack sheet and Carbohydrate counting sheet, freestyle libre brochure  If problems or questions, patient to contact team via:  Phone  Future DSME appointment: PRN

## 2017-05-11 NOTE — Patient Instructions (Addendum)
Plan:  Aim for 2-3 Carb Choices per meal (30-45 grams)   Aim for 0-15 Carbs per snack if hungry  Include protein in moderation with your meals and snacks Consider reading food labels for Total Carbohydrate  Consider increasing your activity daily as tolerated Consider checking BG at alternate times per day as directed by MD Continue taking medication as directed by MD When having the quaker bar snack consider including a protein source.

## 2017-05-22 ENCOUNTER — Ambulatory Visit (INDEPENDENT_AMBULATORY_CARE_PROVIDER_SITE_OTHER): Payer: Medicare Other | Admitting: Family Medicine

## 2017-05-22 ENCOUNTER — Encounter: Payer: Self-pay | Admitting: Family Medicine

## 2017-05-22 ENCOUNTER — Encounter: Payer: Medicare Other | Admitting: Registered"

## 2017-05-22 VITALS — BP 110/68 | HR 100 | Temp 98.3°F | Wt 184.0 lb

## 2017-05-22 DIAGNOSIS — Z794 Long term (current) use of insulin: Secondary | ICD-10-CM | POA: Diagnosis not present

## 2017-05-22 DIAGNOSIS — E1142 Type 2 diabetes mellitus with diabetic polyneuropathy: Secondary | ICD-10-CM

## 2017-05-22 DIAGNOSIS — IMO0002 Reserved for concepts with insufficient information to code with codable children: Secondary | ICD-10-CM

## 2017-05-22 DIAGNOSIS — E1165 Type 2 diabetes mellitus with hyperglycemia: Secondary | ICD-10-CM | POA: Diagnosis not present

## 2017-05-22 DIAGNOSIS — E118 Type 2 diabetes mellitus with unspecified complications: Secondary | ICD-10-CM

## 2017-05-22 NOTE — Patient Instructions (Signed)
Alternate times of day with checking blood sugars Bring back log of blood sugars to review. Leave off the Trulicity.

## 2017-05-22 NOTE — Progress Notes (Signed)
Diabetes Self-Management Education  Visit Type: Follow-up  Appt. Start Time: 1535 Appt. End Time: 2924  05/22/2017  [name], identified by name and date of birth, is a 66 y.o. female with a diagnosis of Diabetes:  .   ASSESSMENT Feels the need to get out on a walk after dinner. Past couple of mornings her readings are going down, 143-150. Pt states ~2 hrs after a meal they are 140-160. Pt reports that her PCP, Dr. Elease Hashimoto, wants her A1c to get down to 5.  Pt states she is concerned about bloating and stomach pain which may be intolerances and/or diverticulitis. Pt reports eliminating some fruit, such as pineapple and strawberries, due to stomach issues.  Pt states she is not depressed, but does feel some anxiety about managing DM.  Pt feels like she hasn't been eating much, but is diligent in get something to eat 3 meals per day.    Breakfast: Small bowl of cereal Lunch: 1/2 sandwich Dinner: Cereal if not in breakfast  (Notes from last visit:) Doesn't like milk boiled eggs, gets pains from lettuce diverticulitis, salmon causes upset stomach.    Diabetes Self-Management Education - 05/22/17 1629      Visit Information   Visit Type Follow-up     Initial Visit   Are you taking your medications as prescribed? --     Dietary Intake   Breakfast Sm bowl of cereal   Lunch 1/2 sandwich   Dinner cereal if didn't have for breakfast     Exercise   Exercise Type --  walks after dinner     Individualized Goals (developed by patient)   Monitoring  test my blood glucose as discussed     Outcomes   Expected Outcomes Demonstrated interest in learning. Expect positive outcomes   Future DMSE PRN     Subsequent Visit   Since your last visit have you continued or begun to take your medications as prescribed? Yes  stopped taking Tulicity/working with Dr. Elease Hashimoto   Since your last visit, are you checking your blood glucose at least once a day? No     Individualized Plan  for Diabetes Self-Management Training:   Learning Objective:  Patient will have a greater understanding of diabetes self-management. Patient education plan is to attend individual and/or group sessions per assessed needs and concerns.  Patient Instructions  Consider trying Fairlife milk to help with the lactose intolerance. Try using creamy peanut butter instead of whole nuts Aim to check out a Librarian, academic for some fresh veggies  Expected Outcomes:  Demonstrated interest in learning. Expect positive outcomes  Education material provided: none  If problems or questions, patient to contact team via:  Phone  Future DSME appointment: PRN

## 2017-05-22 NOTE — Progress Notes (Signed)
Subjective:     Patient ID: Dana Dyer, female   DOB: Apr 16, 1951, 66 y.o.   MRN: [id number]  HPI Patient here for follow-up type 2 diabetes. History of very poor control. Poor compliance with diet. She has seen registered dietitian and has made some changes since last visit. She stopped taking trulicity week ago. She had some progressive nausea and constipation. Her nausea is already improving somewhat. No vomiting. No localizing pain.  Patient is on metformin and also takes Lantus 72 units once daily and is on Humalog and usually takes 10 units with breakfast and 15 units with lunch and usually about 15 units with supper. She does not bring any blood sugars in today for review. No consistent hypoglycemia symptoms.  Past Medical History:  Diagnosis Date  . ALLERGIC RHINITIS 10/09/2007  . Arthritis    hips, spine  . DIABETES MELLITUS, UNCONTROLLED 03/16/2009  . FATIGUE 10/21/2008  . FIBROMYALGIA 10/09/2007  . GERD (gastroesophageal reflux disease)    diet  controlled, no meds  . H/O vaginal hysterectomy   . HYPERLIPIDEMIA 10/09/2007   diet controlled, no meds  . HYPERTENSION 10/09/2007  . Morton's neuroma   . OBSTRUCTIVE SLEEP APNEA 10/09/2007  . Plantar fasciitis    History - right  . Sleep apnea    Uses CPAP  . TRANSAMINASES, SERUM, ELEVATED 07/08/2009   Past Surgical History:  Procedure Laterality Date  . ABDOMINAL HYSTERECTOMY  1979   prolapsed uterus  . BASAL CELL CARCINOMA EXCISION    . COLONOSCOPY  2006   [facility]  . mamoplasty     reduction  . TONSILLECTOMY  1959  . TUBAL LIGATION  1976    reports that she has never smoked. She has never used smokeless tobacco. She reports that she does not drink alcohol or use drugs. family history includes Alcohol abuse in her father; Arthritis in her father and mother; Cancer in her mother; Diabetes in her mother; Hypertension in her maternal grandfather; Miscarriages / [place] in her paternal grandfather. Allergies  Allergen  Reactions  . Amoxicillin-Pot Clavulanate     Severe diarrhea  . Codeine Sulfate     REACTION: "hyper"  . Crestor [Rosuvastatin Calcium] Other (See Comments)    myalgia  . Erythromycin Base   . Invokana [Canagliflozin] Other (See Comments)    Recurrent yeast infections  . Irbesartan-Hydrochlorothiazide   . Latex   . Victoza [Liraglutide] Nausea Only     Review of Systems  Constitutional: Negative for fatigue.  Eyes: Negative for visual disturbance.  Respiratory: Negative for cough, chest tightness, shortness of breath and wheezing.   Cardiovascular: Negative for chest pain, palpitations and leg swelling.  Neurological: Negative for dizziness, seizures, syncope, weakness, light-headedness and headaches.       Objective:   Physical Exam  Constitutional: She appears well-developed and well-nourished.  Eyes: Pupils are equal, round, and reactive to light.  Neck: Neck supple. No JVD present. No thyromegaly present.  Cardiovascular: Normal rate and regular rhythm.  Exam reveals no gallop.   Pulmonary/Chest: Effort normal and breath sounds normal. No respiratory distress. She has no wheezes. She has no rales.  Musculoskeletal: She exhibits no edema.  Neurological: She is alert.       Assessment:     Type 2 diabetes poorly controlled. She's had intolerance with SGLT 2 class and GLP-1 class    Plan:     -Discontinue trulicity -We've advised that she alternate times of day checking blood sugars and stressed importance of bringing and  readings to review in order for [place] to be able to help regulate her Humalog. -Continue close follow-up with dietitian -Stressed importance of regular exercise -Return in one month and bring log of readings in to review  Eulas Post MD [facility] at [facility]

## 2017-05-22 NOTE — Patient Instructions (Addendum)
Consider trying Fairlife milk to help with the lactose intolerance. Try using creamy peanut butter instead of whole nuts Aim to check out a Librarian, academic for some fresh veggies

## 2017-05-24 ENCOUNTER — Ambulatory Visit
Admission: RE | Admit: 2017-05-24 | Discharge: 2017-05-24 | Disposition: A | Discharge status: Home / Self Care | Payer: Medicare Other | Source: Ambulatory Visit | Attending: Family Medicine | Admitting: Family Medicine

## 2017-05-24 DIAGNOSIS — Z1231 Encounter for screening mammogram for malignant neoplasm of breast: Secondary | ICD-10-CM

## 2017-06-01 ENCOUNTER — Other Ambulatory Visit: Payer: Self-pay | Admitting: Family Medicine

## 2017-06-16 ENCOUNTER — Ambulatory Visit: Payer: Medicare Other | Admitting: Registered"

## 2017-06-18 ENCOUNTER — Other Ambulatory Visit: Payer: Self-pay | Admitting: Family Medicine

## 2017-06-21 ENCOUNTER — Encounter: Payer: Self-pay | Admitting: Family Medicine

## 2017-06-21 ENCOUNTER — Ambulatory Visit (INDEPENDENT_AMBULATORY_CARE_PROVIDER_SITE_OTHER): Payer: Medicare Other | Admitting: Family Medicine

## 2017-06-21 VITALS — BP 130/62 | HR 100 | Temp 97.7°F | Ht 63.0 in | Wt 184.6 lb

## 2017-06-21 DIAGNOSIS — R5383 Other fatigue: Secondary | ICD-10-CM

## 2017-06-21 DIAGNOSIS — Z794 Long term (current) use of insulin: Secondary | ICD-10-CM | POA: Diagnosis not present

## 2017-06-21 DIAGNOSIS — R740 Nonspecific elevation of levels of transaminase and lactic acid dehydrogenase [LDH]: Secondary | ICD-10-CM

## 2017-06-21 DIAGNOSIS — R7402 Elevation of levels of lactic acid dehydrogenase (LDH): Secondary | ICD-10-CM

## 2017-06-21 DIAGNOSIS — E1165 Type 2 diabetes mellitus with hyperglycemia: Secondary | ICD-10-CM

## 2017-06-21 DIAGNOSIS — I10 Essential (primary) hypertension: Secondary | ICD-10-CM

## 2017-06-21 DIAGNOSIS — E1142 Type 2 diabetes mellitus with diabetic polyneuropathy: Secondary | ICD-10-CM | POA: Diagnosis not present

## 2017-06-21 DIAGNOSIS — R7401 Elevation of levels of liver transaminase levels: Secondary | ICD-10-CM

## 2017-06-21 DIAGNOSIS — IMO0002 Reserved for concepts with insufficient information to code with codable children: Secondary | ICD-10-CM

## 2017-06-21 LAB — HEMOGLOBIN A1C: Hgb A1c MFr Bld: 10.4 % — ABNORMAL HIGH (ref 4.6–6.5)

## 2017-06-21 LAB — VITAMIN B12: Vitamin B-12: 228 pg/mL (ref 211–911)

## 2017-06-21 NOTE — Patient Instructions (Addendum)
WE NOW OFFER   [facility] FAST TRACK!!!  SAME DAY Appointments for ACUTE CARE  Such as: Sprains, Injuries, cuts, abrasions, rashes, muscle pain, joint pain, back pain Colds, flu, sore throats, headache, allergies, cough, fever  Ear pain, sinus and eye infections Abdominal pain, nausea, vomiting, diarrhea, upset stomach Animal/insect bites  3 Easy Ways to Schedule: Walk-In Scheduling Call in scheduling Mychart Sign-up: [email or web address]        Increase Lantus to 75 units once daily Increase the humalog to 18 units with meals. Please check some 2 hour post-prandial sugars.

## 2017-06-21 NOTE — Progress Notes (Signed)
Subjective:     Patient ID: Dana Dyer, female   DOB: March 19, 1951, 66 y.o.   MRN: [id number]  HPI Patient seen for medical follow-up  Type 2 diabetes. History of poor control. Last A1c 10%. Poor compliance with diet. She is currently seeing registered dietitian. Does bring in some readings but she did not check any postprandials and basically brings in only some fasting readings. These vary from around [place]. Currently on Lantus 70 units once daily and Humalog usually 10 units with breakfast and 15 units with lunch and supper. Only one possible hypoglycemic episode with playing golf recently. Did not check blood sugar during episode. Her nausea fully resolved after stopping trulicity.  Complains of some general fatigue. She has obstructive sleep apnea. Frequent daytime somnolence. She said she has not had any sleep studies recently. Compliant with using CPAP each night. Long history of metformin use. No chronic PPI use  Hypertension which has been stable.  Past Medical History:  Diagnosis Date  . ALLERGIC RHINITIS 10/09/2007  . Arthritis    hips, spine  . DIABETES MELLITUS, UNCONTROLLED 03/16/2009  . FATIGUE 10/21/2008  . FIBROMYALGIA 10/09/2007  . GERD (gastroesophageal reflux disease)    diet  controlled, no meds  . H/O vaginal hysterectomy   . HYPERLIPIDEMIA 10/09/2007   diet controlled, no meds  . HYPERTENSION 10/09/2007  . Morton's neuroma   . OBSTRUCTIVE SLEEP APNEA 10/09/2007  . Plantar fasciitis    History - right  . Sleep apnea    Uses CPAP  . TRANSAMINASES, SERUM, ELEVATED 07/08/2009   Past Surgical History:  Procedure Laterality Date  . ABDOMINAL HYSTERECTOMY  1979   prolapsed uterus  . BASAL CELL CARCINOMA EXCISION    . BREAST BIOPSY     left 2012 stereo  . BREAST REDUCTION SURGERY Bilateral 1992  . COLONOSCOPY  2006   [facility]  . mamoplasty     reduction  . TONSILLECTOMY  1959  . TUBAL LIGATION  1976    reports that she has never smoked. She has never used  smokeless tobacco. She reports that she does not drink alcohol or use drugs. family history includes Alcohol abuse in her father; Arthritis in her father and mother; Cancer in her mother; Diabetes in her mother; Hypertension in her maternal grandfather; Miscarriages / [place] in her paternal grandfather. Allergies  Allergen Reactions  . Amoxicillin-Pot Clavulanate     Severe diarrhea  . Codeine Sulfate     REACTION: "hyper"  . Crestor [Rosuvastatin Calcium] Other (See Comments)    myalgia  . Erythromycin Base   . Invokana [Canagliflozin] Other (See Comments)    Recurrent yeast infections  . Irbesartan-Hydrochlorothiazide   . Latex   . Victoza [Liraglutide] Nausea Only     Review of Systems  Constitutional: Positive for fatigue. Negative for appetite change and unexpected weight change.  Eyes: Negative for visual disturbance.  Respiratory: Negative for cough, chest tightness, shortness of breath and wheezing.   Cardiovascular: Negative for chest pain, palpitations and leg swelling.  Gastrointestinal: Negative for abdominal pain.  Neurological: Negative for dizziness, seizures, syncope, weakness, light-headedness and headaches.       Objective:   Physical Exam  Constitutional: She appears well-developed and well-nourished.  Eyes: Pupils are equal, round, and reactive to light.  Neck: Neck supple. No JVD present. No thyromegaly present.  Cardiovascular: Normal rate and regular rhythm.  Exam reveals no gallop.   Pulmonary/Chest: Effort normal and breath sounds normal. No respiratory distress. She has no wheezes.  She has no rales.  Musculoskeletal: She exhibits no edema.  Neurological: She is alert.       Assessment:     #1 type 2 diabetes poorly controlled with history of poor compliance  #2 hypertension stable and at goal  #3 history of mild elevated liver transaminases probably secondary to fatty liver changes  #4 fatigue. Probably multifactorial. History of  obstructive sleep apnea. Rule out B12 deficiency with chronic metformin use. Recent TSH last fall normal    Plan:     -Repeat hemoglobin A1c and B12 level -She is encouraged to schedule follow-up with pulmonary regarding her obstructive sleep apnea. May need repeat sleep study -Continue follow-up with registered dietitian -Suggested calorie tracking application which we have suggested the past -Increase Lantus to 75 units daily and increase Humalog 18 units with meals -Follow up one month and bring readings and have again asked that she alternate between fasting and other times of day such as postprandial -We discussed diet in some detail. We discussed low glycemic snacks such as almonds, walnuts,peanut butter. Low sugar yogurt.  Eulas Post MD [facility] at [facility]

## 2017-06-23 ENCOUNTER — Other Ambulatory Visit: Payer: Self-pay | Admitting: Family Medicine

## 2017-06-26 ENCOUNTER — Other Ambulatory Visit: Payer: Self-pay | Admitting: Family Medicine

## 2017-07-11 ENCOUNTER — Encounter: Payer: Self-pay | Admitting: Family Medicine

## 2017-07-11 LAB — HM DIABETES EYE EXAM

## 2017-07-18 ENCOUNTER — Encounter: Payer: Self-pay | Admitting: Family Medicine

## 2017-07-18 ENCOUNTER — Ambulatory Visit (INDEPENDENT_AMBULATORY_CARE_PROVIDER_SITE_OTHER): Payer: Medicare Other | Admitting: Family Medicine

## 2017-07-18 VITALS — BP 140/80 | HR 118 | Temp 98.3°F | Wt 185.8 lb

## 2017-07-18 DIAGNOSIS — F332 Major depressive disorder, recurrent severe without psychotic features: Secondary | ICD-10-CM | POA: Diagnosis not present

## 2017-07-18 MED ORDER — BUPROPION HCL ER (XL) 150 MG PO TB24: 150.0000 mg | ORAL_TABLET | Freq: Every day | ORAL | 5 refills | Status: DC

## 2017-07-18 NOTE — Progress Notes (Signed)
Subjective:     Patient ID: Dana Dyer, female   DOB: [birth year], 66 y.o.   MRN: [id number]  HPI Patient here accompanied by husband with increased depression. She states she's had long hx of recurrent depression previously but she's had some fleeting suicidal thoughts but no imminent suicidal ideation. She just recently discussed with her daughter and husband. She feels safe at this time. She's been on Lexapro for several years  She denies other antidepressants previously. She's not had any recent counseling. Denies any recent new stressors. She continues to stay engaged with things like golf but states that she has to push herself to get out of bed and get involved with activities. She has low motivation and low energy and low initiative. Difficulty concentrating at times.  Past Medical History:  Diagnosis Date  . ALLERGIC RHINITIS 10/09/2007  . Arthritis    hips, spine  . DIABETES MELLITUS, UNCONTROLLED 03/16/2009  . FATIGUE 10/21/2008  . FIBROMYALGIA 10/09/2007  . GERD (gastroesophageal reflux disease)    diet  controlled, no meds  . H/O vaginal hysterectomy   . HYPERLIPIDEMIA 10/09/2007   diet controlled, no meds  . HYPERTENSION 10/09/2007  . Morton's neuroma   . OBSTRUCTIVE SLEEP APNEA 10/09/2007  . Plantar fasciitis    History - right  . Sleep apnea    Uses CPAP  . TRANSAMINASES, SERUM, ELEVATED 07/08/2009   Past Surgical History:  Procedure Laterality Date  . ABDOMINAL HYSTERECTOMY  1979   prolapsed uterus  . BASAL CELL CARCINOMA EXCISION    . BREAST BIOPSY     left 2012 stereo  . BREAST REDUCTION SURGERY Bilateral 1992  . COLONOSCOPY  2006   [facility]  . mamoplasty     reduction  . TONSILLECTOMY  1959  . TUBAL LIGATION  1976    reports that she has never smoked. She has never used smokeless tobacco. She reports that she does not drink alcohol or use drugs. family history includes Alcohol abuse in her father; Arthritis in her father and mother; Cancer in her mother;  Diabetes in her mother; Hypertension in her maternal grandfather; Miscarriages / [place] in her paternal grandfather. Allergies  Allergen Reactions  . Amoxicillin-Pot Clavulanate     Severe diarrhea  . Codeine Sulfate     REACTION: "hyper"  . Crestor [Rosuvastatin Calcium] Other (See Comments)    myalgia  . Erythromycin Base   . Invokana [Canagliflozin] Other (See Comments)    Recurrent yeast infections  . Irbesartan-Hydrochlorothiazide   . Latex   . Victoza [Liraglutide] Nausea Only     Review of Systems  Constitutional: Positive for fatigue. Negative for appetite change and unexpected weight change.  Eyes: Negative for visual disturbance.  Respiratory: Negative for cough, chest tightness, shortness of breath and wheezing.   Cardiovascular: Negative for chest pain, palpitations and leg swelling.  Neurological: Negative for dizziness, seizures, syncope, weakness, light-headedness and headaches.  Psychiatric/Behavioral: Positive for dysphoric mood. Negative for agitation, confusion and sleep disturbance.       Objective:   Physical Exam  Constitutional: She appears well-developed and well-nourished.  Cardiovascular: Normal rate and regular rhythm.   Pulmonary/Chest: Effort normal and breath sounds normal. No respiratory distress. She has no wheezes. She has no rales.  Psychiatric:  PH Q-9 score of 19       Assessment:     Long-standing history of recurrent depression. Major depressive episode with moderate to severe exacerbation. No active suicidal intent  PHQ-9 score of 19  Plan:     -We recommended counseling with names given -Add Wellbutrin XL 150 mg daily in addition to her Lexapro. Hopefully this will help with low energy and low initiative -Reassess in 3 weeks and sooner as needed -We spent over 20 minutes of which greater than 50% in direct counseling and face-to-face interaction-discussing depression, signs and symptoms of worsening depression, medication  options, potential medication side effects, and counseling options  Eulas Post MD [facility] at [facility]

## 2017-07-18 NOTE — Patient Instructions (Signed)
Set up counseling Start the Wellbutrin and let's follow up in 3 weeks Take the Wellbutrin in the morning.

## 2017-07-31 ENCOUNTER — Encounter: Payer: Self-pay | Admitting: Family Medicine

## 2017-07-31 ENCOUNTER — Telehealth: Payer: Self-pay | Admitting: *Deleted

## 2017-07-31 ENCOUNTER — Ambulatory Visit (INDEPENDENT_AMBULATORY_CARE_PROVIDER_SITE_OTHER): Payer: Medicare Other | Admitting: Family Medicine

## 2017-07-31 VITALS — BP 118/70 | HR 89 | Temp 98.0°F | Wt 179.6 lb

## 2017-07-31 DIAGNOSIS — E1142 Type 2 diabetes mellitus with diabetic polyneuropathy: Secondary | ICD-10-CM

## 2017-07-31 DIAGNOSIS — Z23 Encounter for immunization: Secondary | ICD-10-CM | POA: Diagnosis not present

## 2017-07-31 DIAGNOSIS — IMO0002 Reserved for concepts with insufficient information to code with codable children: Secondary | ICD-10-CM

## 2017-07-31 DIAGNOSIS — F322 Major depressive disorder, single episode, severe without psychotic features: Secondary | ICD-10-CM

## 2017-07-31 DIAGNOSIS — E1165 Type 2 diabetes mellitus with hyperglycemia: Secondary | ICD-10-CM

## 2017-07-31 DIAGNOSIS — R7989 Other specified abnormal findings of blood chemistry: Secondary | ICD-10-CM | POA: Diagnosis not present

## 2017-07-31 DIAGNOSIS — Z794 Long term (current) use of insulin: Secondary | ICD-10-CM

## 2017-07-31 DIAGNOSIS — E538 Deficiency of other specified B group vitamins: Secondary | ICD-10-CM | POA: Insufficient documentation

## 2017-07-31 LAB — POCT GLYCOSYLATED HEMOGLOBIN (HGB A1C): Hemoglobin A1C: 9.4

## 2017-07-31 MED ORDER — BUPROPION HCL ER (XL) 150 MG PO TB24: 150.0000 mg | ORAL_TABLET | Freq: Every day | ORAL | 5 refills | Status: DC

## 2017-07-31 NOTE — Progress Notes (Signed)
Subjective:     Patient ID: Dana Dyer, female   DOB: Jan 27, 1951, 66 y.o.   MRN: [id number]  HPI Patient seen for follow-up regarding her depression. She was seen here couple weeks ago with severe depression symptoms and PHQ-9 score of 19. She had some fleeting suicidal faults but no imminent ideation. She was already taking Lexapro and she had very low motivation and fatigue and we added low-dose Wellbutrin XL 150 mg once daily. She states that she is "95% better ". She feels much more initiative and has been on a couple short trips already. She's not seeing active suicidal ideation as she did previously. Concentration somewhat improved. Outlook is definitely improved.  Type 2 diabetes. History of poor control. Has stepped up compliance somewhat recently. A1c today 9.4% which is down a full point from previously  History of low B12. Level couple months ago was 228. She is now taking daily oral replacement.  Past Medical History:  Diagnosis Date  . ALLERGIC RHINITIS 10/09/2007  . Arthritis    hips, spine  . DIABETES MELLITUS, UNCONTROLLED 03/16/2009  . FATIGUE 10/21/2008  . FIBROMYALGIA 10/09/2007  . GERD (gastroesophageal reflux disease)    diet  controlled, no meds  . H/O vaginal hysterectomy   . HYPERLIPIDEMIA 10/09/2007   diet controlled, no meds  . HYPERTENSION 10/09/2007  . Morton's neuroma   . OBSTRUCTIVE SLEEP APNEA 10/09/2007  . Plantar fasciitis    History - right  . Sleep apnea    Uses CPAP  . TRANSAMINASES, SERUM, ELEVATED 07/08/2009   Past Surgical History:  Procedure Laterality Date  . ABDOMINAL HYSTERECTOMY  1979   prolapsed uterus  . BASAL CELL CARCINOMA EXCISION    . BREAST BIOPSY     left 2012 stereo  . BREAST REDUCTION SURGERY Bilateral 1992  . COLONOSCOPY  2006   [facility]  . mamoplasty     reduction  . TONSILLECTOMY  1959  . TUBAL LIGATION  1976    reports that she has never smoked. She has never used smokeless tobacco. She reports that she does not drink  alcohol or use drugs. family history includes Alcohol abuse in her father; Arthritis in her father and mother; Cancer in her mother; Diabetes in her mother; Hypertension in her maternal grandfather; Miscarriages / [place] in her paternal grandfather. Allergies  Allergen Reactions  . Amoxicillin-Pot Clavulanate     Severe diarrhea  . Codeine Sulfate     REACTION: "hyper"  . Crestor [Rosuvastatin Calcium] Other (See Comments)    myalgia  . Erythromycin Base   . Invokana [Canagliflozin] Other (See Comments)    Recurrent yeast infections  . Irbesartan-Hydrochlorothiazide   . Latex   . Victoza [Liraglutide] Nausea Only     Review of Systems  Constitutional: Negative for fatigue.  Eyes: Negative for visual disturbance.  Respiratory: Negative for cough, chest tightness, shortness of breath and wheezing.   Cardiovascular: Negative for chest pain, palpitations and leg swelling.  Neurological: Negative for dizziness, seizures, syncope, weakness, light-headedness and headaches.  Psychiatric/Behavioral: Negative for agitation, confusion and suicidal ideas.       Objective:   Physical Exam  Constitutional: She is oriented to person, place, and time. She appears well-developed and well-nourished.  Neck: Neck supple. No thyromegaly present.  Cardiovascular: Normal rate and regular rhythm.   Pulmonary/Chest: Effort normal and breath sounds normal. No respiratory distress. She has no wheezes. She has no rales.  Musculoskeletal: She exhibits no edema.  Lymphadenopathy:    She has  no cervical adenopathy.  Neurological: She is alert and oriented to person, place, and time.  Psychiatric: She has a normal mood and affect. Her behavior is normal.  PHQ-2=1       Assessment:     #1 major depressive episode severe-improved with recent addition of Wellbutrin to Lexapro. She still has counseling pending which she plans to start tomorrow  #2 type 2 diabetes with history of poor control though  improved point over the past few months with improvement today to 9.4%  #3 history of low B12    Plan:     -Continue combination therapy with Lexapro and Wellbutrin -Reassess in one month and perform full PHQ-9 at that time -Continue with counseling as above -She is encouraged to continue with exercise and weight control. Hopefully can see her A1c continue to improve as her initiative and activity levels improve  Eulas Post MD [facility] at [facility]

## 2017-08-01 NOTE — Telephone Encounter (Signed)
Opened in error

## 2017-08-08 ENCOUNTER — Ambulatory Visit: Payer: Self-pay | Admitting: Family Medicine

## 2017-08-22 ENCOUNTER — Ambulatory Visit (INDEPENDENT_AMBULATORY_CARE_PROVIDER_SITE_OTHER): Payer: Medicare Other | Admitting: Family Medicine

## 2017-08-22 ENCOUNTER — Encounter: Payer: Self-pay | Admitting: Family Medicine

## 2017-08-22 VITALS — BP 130/70 | HR 120 | Temp 99.2°F | Wt 179.6 lb

## 2017-08-22 DIAGNOSIS — E559 Vitamin D deficiency, unspecified: Secondary | ICD-10-CM

## 2017-08-22 DIAGNOSIS — F322 Major depressive disorder, single episode, severe without psychotic features: Secondary | ICD-10-CM | POA: Diagnosis not present

## 2017-08-22 DIAGNOSIS — E538 Deficiency of other specified B group vitamins: Secondary | ICD-10-CM | POA: Diagnosis not present

## 2017-08-22 DIAGNOSIS — M722 Plantar fascial fibromatosis: Secondary | ICD-10-CM | POA: Diagnosis not present

## 2017-08-22 MED ORDER — DICLOFENAC SODIUM 1 % TD GEL: 2.0000 g | Freq: Four times a day (QID) | TRANSDERMAL | 0 refills | Status: DC

## 2017-08-22 NOTE — Patient Instructions (Signed)

## 2017-08-22 NOTE — Progress Notes (Signed)
Subjective:     Patient ID: Dana Dyer, female   DOB: [birth year], 66 y.o.   MRN: [id number]  HPI Patient here to discuss several items as follows  New problem of left heel pain. She has pain left plantar fascial region. Recently got a new pair of shoes with inserts but has not helped much. She's tried icing but just a couple of occasions. She's tried some stretches. Pain is worse first thing in the morning and when she gets up after prolonged periods of sitting. No Achilles tenderness. Pain usually improves after walking for some time  Patient has recent low B12 of 228. She elected for some oral replacement. She started this couple months ago and feels that her cognitive abilities have improved somewhat. She is able to focus better. Seems more alert.  History of vitamin D deficiency. She is taking replacement and requesting follow-up levels today.  History of depression. Recent addition of Wellbutrin. Depression is deathly improved. No suicidal ideation. Compliant with therapy.  Past Medical History:  Diagnosis Date  . ALLERGIC RHINITIS 10/09/2007  . Arthritis    hips, spine  . DIABETES MELLITUS, UNCONTROLLED 03/16/2009  . FATIGUE 10/21/2008  . FIBROMYALGIA 10/09/2007  . GERD (gastroesophageal reflux disease)    diet  controlled, no meds  . H/O vaginal hysterectomy   . HYPERLIPIDEMIA 10/09/2007   diet controlled, no meds  . HYPERTENSION 10/09/2007  . Morton's neuroma   . OBSTRUCTIVE SLEEP APNEA 10/09/2007  . Plantar fasciitis    History - right  . Sleep apnea    Uses CPAP  . TRANSAMINASES, SERUM, ELEVATED 07/08/2009   Past Surgical History:  Procedure Laterality Date  . ABDOMINAL HYSTERECTOMY  1979   prolapsed uterus  . BASAL CELL CARCINOMA EXCISION    . BREAST BIOPSY     left 2012 stereo  . BREAST REDUCTION SURGERY Bilateral 1992  . COLONOSCOPY  2006   [facility]  . mamoplasty     reduction  . TONSILLECTOMY  1959  . TUBAL LIGATION  1976    reports that she has never  smoked. She has never used smokeless tobacco. She reports that she does not drink alcohol or use drugs. family history includes Alcohol abuse in her father; Arthritis in her father and mother; Cancer in her mother; Diabetes in her mother; Hypertension in her maternal grandfather; Miscarriages / [place] in her paternal grandfather. Allergies  Allergen Reactions  . Amoxicillin-Pot Clavulanate     Severe diarrhea  . Codeine Sulfate     REACTION: "hyper"  . Crestor [Rosuvastatin Calcium] Other (See Comments)    myalgia  . Erythromycin Base   . Invokana [Canagliflozin] Other (See Comments)    Recurrent yeast infections  . Irbesartan-Hydrochlorothiazide   . Latex   . Victoza [Liraglutide] Nausea Only     Review of Systems  Constitutional: Negative for fatigue.  Eyes: Negative for visual disturbance.  Respiratory: Negative for cough, chest tightness, shortness of breath and wheezing.   Cardiovascular: Negative for chest pain, palpitations and leg swelling.  Endocrine: Negative for polydipsia and polyuria.  Neurological: Negative for dizziness, seizures, syncope, weakness, light-headedness and headaches.       Objective:   Physical Exam  Constitutional: She appears well-developed and well-nourished.  Eyes: Pupils are equal, round, and reactive to light.  Neck: Neck supple. No JVD present. No thyromegaly present.  Cardiovascular: Normal rate and regular rhythm.  Exam reveals no gallop.   Pulmonary/Chest: Effort normal and breath sounds normal. No respiratory distress. She has  no wheezes. She has no rales.  Musculoskeletal: She exhibits no edema.  Patient has tenderness over the left plantar fascia region-near attachment to calcaneus. No Achilles tenderness. Full range of motion ankle.  Neurological: She is alert.       Assessment:     #1 left plantar fasciitis  #2 history of borderline low B12 deficiency  #3 history of vitamin D deficiency  #4 recent depression improved  with combination therapy of serotonin reuptake inhibitor and Wellbutrin    Plan:     -Discussed treatment of plantar fasciitis. She'll try some icing and stretching and we reviewed some strengthening exercises. -Discussed possible steroid injection but she would like to try first the above items -Diclofenac gel 1% 3 times daily as needed -Hopefully she can pick up her exercise soon with more walking. She has follow-up in a couple months and repeat A1c then -Repeat B12 and 25 hydroxy vitamin D levels today  Eulas Post MD [facility] at [facility]

## 2017-08-23 ENCOUNTER — Telehealth: Payer: Self-pay | Admitting: Family Medicine

## 2017-08-23 LAB — VITAMIN B12: VITAMIN B 12: 334 pg/mL (ref 211–911)

## 2017-08-23 LAB — VITAMIN D 25 HYDROXY (VIT D DEFICIENCY, FRACTURES): VITD: 36.6 ng/mL (ref 30.00–100.00)

## 2017-08-23 NOTE — Telephone Encounter (Signed)
Received PA request for Diclofenac. PA submitted & approved. Form faxed back to pharmacy.

## 2017-08-28 ENCOUNTER — Ambulatory Visit: Payer: Self-pay | Admitting: Family Medicine

## 2017-09-13 ENCOUNTER — Telehealth: Payer: Self-pay | Admitting: Emergency Medicine

## 2017-09-13 NOTE — Telephone Encounter (Signed)
Pt called and wants to know if there is a way you can fit her in for an injection. She is going out of town next week and would like to get in tomorrow if possible. Thanks.

## 2017-09-14 ENCOUNTER — Ambulatory Visit: Payer: Self-pay

## 2017-09-14 ENCOUNTER — Encounter: Payer: Self-pay | Admitting: Family Medicine

## 2017-09-14 ENCOUNTER — Ambulatory Visit (INDEPENDENT_AMBULATORY_CARE_PROVIDER_SITE_OTHER): Payer: Medicare Other | Admitting: Family Medicine

## 2017-09-14 VITALS — BP 150/70 | HR 95 | Ht 63.0 in | Wt 181.0 lb

## 2017-09-14 DIAGNOSIS — M25551 Pain in right hip: Secondary | ICD-10-CM

## 2017-09-14 DIAGNOSIS — M7061 Trochanteric bursitis, right hip: Secondary | ICD-10-CM

## 2017-09-14 NOTE — Assessment & Plan Note (Signed)
Worsening symptoms. Injected again. We discussed icing regimen, which activities to do in which ones to avoid. Patient given topical anti-inflammatories again. We discussed worsening symptoms we need to consider further x-rays of patient's hip. Follow-up again in 4 weeks.

## 2017-09-14 NOTE — Telephone Encounter (Signed)
lmovm for pt to return call.  

## 2017-09-14 NOTE — Progress Notes (Signed)
[name] [address] [place], [place] [address] Phone: [phone] Subjective:    I'm seeing this patient by the request  of:    CC: Right-sided hip pain  [id number]  Dana Dyer is a 66 y.o. female coming in with complaint of right-sided hip pain. Has had this previously. Was diagnosed previously with a greater trochanteric bursitis. Was seen one year ago and responded well. Patient did do well with injections with no side effects. Patient was trying to be active and unfortunate had a left-sided foot injury and was compensating which seem to exacerbate this again.     Past Medical History:  Diagnosis Date  . ALLERGIC RHINITIS 10/09/2007  . Arthritis    hips, spine  . DIABETES MELLITUS, UNCONTROLLED 03/16/2009  . FATIGUE 10/21/2008  . FIBROMYALGIA 10/09/2007  . GERD (gastroesophageal reflux disease)    diet  controlled, no meds  . H/O vaginal hysterectomy   . HYPERLIPIDEMIA 10/09/2007   diet controlled, no meds  . HYPERTENSION 10/09/2007  . Morton's neuroma   . OBSTRUCTIVE SLEEP APNEA 10/09/2007  . Plantar fasciitis    History - right  . Sleep apnea    Uses CPAP  . TRANSAMINASES, SERUM, ELEVATED 07/08/2009   Past Surgical History:  Procedure Laterality Date  . ABDOMINAL HYSTERECTOMY  1979   prolapsed uterus  . BASAL CELL CARCINOMA EXCISION    . BREAST BIOPSY     left 2012 stereo  . BREAST REDUCTION SURGERY Bilateral 1992  . COLONOSCOPY  2006   [facility]  . mamoplasty     reduction  . TONSILLECTOMY  1959  . TUBAL LIGATION  1976   Social History   Social History  . Marital status: Married    Spouse name: N/A  . Number of children: N/A  . Years of education: N/A   Social History Main Topics  . Smoking status: Never Smoker  . Smokeless tobacco: Never Used  . Alcohol use No  . Drug use: No  . Sexual activity: Yes    Birth control/ protection: Other-see comments, Post-menopausal     Comment: Hysterectomy   Other Topics  Concern  . Not on file   Social History Narrative  . No narrative on file   Allergies  Allergen Reactions  . Amoxicillin-Pot Clavulanate     Severe diarrhea  . Codeine Sulfate     REACTION: "hyper"  . Crestor [Rosuvastatin Calcium] Other (See Comments)    myalgia  . Erythromycin Base   . Invokana [Canagliflozin] Other (See Comments)    Recurrent yeast infections  . Irbesartan-Hydrochlorothiazide   . Latex   . Victoza [Liraglutide] Nausea Only   Family History  Problem Relation Age of Onset  . Diabetes Mother   . Arthritis Mother   . Cancer Mother        breast  . Alcohol abuse Father   . Arthritis Father   . Hypertension Maternal Grandfather   . Miscarriages / Stillbirths Paternal Grandfather   . Colon polyps Neg Hx   . Colon cancer Neg Hx   . Esophageal cancer Neg Hx   . Rectal cancer Neg Hx   . Stomach cancer Neg Hx      Past medical history, social, surgical and family history all reviewed in electronic medical record.  No pertanent information unless stated regarding to the chief complaint.   Review of Systems:Review of systems updated and as accurate as of 09/14/17  No headache, visual changes, nausea, vomiting, diarrhea,  constipation, dizziness, abdominal pain, skin rash, fevers, chills, night sweats, weight loss, swollen lymph nodes, body aches, joint swelling, muscle aches, chest pain, shortness of breath, mood changes.   Objective  Blood pressure (!) 123/45. Systems examined below as of 09/14/17   General: No apparent distress alert and oriented x3 mood and affect normal, dressed appropriately.  HEENT: Pupils equal, extraocular movements intact  Respiratory: Patient's speak in full sentences and does not appear short of breath  Cardiovascular: No lower extremity edema, non tender, no erythema  Skin: Warm dry intact with no signs of infection or rash on extremities or on axial skeleton.  Abdomen: Soft nontender  Neuro: Cranial nerves II through XII are  intact, neurovascularly intact in all extremities with 2+ DTRs and 2+ pulses.  Lymph: No lymphadenopathy of posterior or anterior cervical chain or axillae bilaterally.  Gait normal with good balance and coordination.  MSK:  Non tender with full range of motion and good stability and symmetric strength and tone of shoulders, elbows, wrist,  knee and ankles bilaterally.  Hip exam shows some mild decrease in internal and external range of motion. Patient does have some very minimal groin pain with internal rotation. Severe tenderness over the greater trochanteric area on the right hip. Positive Faber test. Negative straight leg test.   Procedure: Real-time Ultrasound Guided Injection of right greater trochanteric bursitis secondary to patient's body habitus Device: GE Logiq Q7 Ultrasound guided injection is preferred based studies that show increased duration, increased effect, greater accuracy, decreased procedural pain, increased response rate, and decreased cost with ultrasound guided versus blind injection.  Verbal informed consent obtained.  Time-out conducted.  Noted no overlying erythema, induration, or other signs of local infection.  Skin prepped in a sterile fashion.  Local anesthesia: Topical Ethyl chloride.  With sterile technique and under real time ultrasound guidance:  Greater trochanteric area was visualized and patient's bursa was noted. A 22-gauge 3 inch needle was inserted and 4 cc of 0.5% Marcaine and 1 cc of Kenalog 40 mg/dL was injected. Pictures taken Completed without difficulty  Pain immediately resolved suggesting accurate placement of the medication.  Advised to call if fevers/chills, erythema, induration, drainage, or persistent bleeding.  Images permanently stored and available for review in the ultrasound unit.  Impression: Technically successful ultrasound guided injection.   Impression and Recommendations:     This case required medical decision making of  moderate complexity.      Note: This dictation was prepared with Dragon dictation along with smaller phrase technology. Any transcriptional errors that result from this process are unintentional.

## 2017-09-14 NOTE — Patient Instructions (Signed)
Good to see you  Alvera Singh is your friend.  Ice 20 minutes 2 times daily. Usually after activity and before bed. pennsaid pinkie amount topically 2 times daily as needed.   Stay active.  Exercises 3 times a week.  Continue the vitamin D If worsening pain we may need to look at the hip joint See me again in 4 weeks.

## 2017-09-14 NOTE — Telephone Encounter (Signed)
Pt scheduled today at 130pm

## 2017-09-18 ENCOUNTER — Other Ambulatory Visit: Payer: Self-pay | Admitting: Family Medicine

## 2017-09-30 ENCOUNTER — Other Ambulatory Visit: Payer: Self-pay | Admitting: Family Medicine

## 2017-10-10 ENCOUNTER — Other Ambulatory Visit: Payer: Self-pay | Admitting: *Deleted

## 2017-10-10 MED ORDER — BUPROPION HCL ER (XL) 150 MG PO TB24: 150.0000 mg | ORAL_TABLET | Freq: Every day | ORAL | 1 refills | Status: DC

## 2017-10-12 ENCOUNTER — Ambulatory Visit: Payer: Medicare Other | Admitting: Family Medicine

## 2017-10-15 NOTE — Progress Notes (Deleted)
[facility] [address] [place], [place] [address] Phone: [phone] Subjective:    I'm seeing this patient by the request  of:    CC: Right hip  [id number]  Dana Dyer is a 66 y.o. female coming in with complaint of patient was seen previously he did have more of a greater trochanteric bursitis.  Given an injection one month ago.  Was to do conservative therapy.  Concern for potentially worsening hip arthritis.  Patient was to do conservative therapy including home exercises.  Patient states     Past Medical History:  Diagnosis Date  . ALLERGIC RHINITIS 10/09/2007  . Arthritis    hips, spine  . DIABETES MELLITUS, UNCONTROLLED 03/16/2009  . FATIGUE 10/21/2008  . FIBROMYALGIA 10/09/2007  . GERD (gastroesophageal reflux disease)    diet  controlled, no meds  . H/O vaginal hysterectomy   . HYPERLIPIDEMIA 10/09/2007   diet controlled, no meds  . HYPERTENSION 10/09/2007  . Morton's neuroma   . OBSTRUCTIVE SLEEP APNEA 10/09/2007  . Plantar fasciitis    History - right  . Sleep apnea    Uses CPAP  . TRANSAMINASES, SERUM, ELEVATED 07/08/2009   Past Surgical History:  Procedure Laterality Date  . ABDOMINAL HYSTERECTOMY  1979   prolapsed uterus  . BASAL CELL CARCINOMA EXCISION    . BREAST BIOPSY     left 2012 stereo  . BREAST REDUCTION SURGERY Bilateral 1992  . COLONOSCOPY  2006   [facility]  . mamoplasty     reduction  . TONSILLECTOMY  1959  . TUBAL LIGATION  1976   Social History   Socioeconomic History  . Marital status: Married    Spouse name: Not on file  . Number of children: Not on file  . Years of education: Not on file  . Highest education level: Not on file  Social Needs  . Financial resource strain: Not on file  . Food insecurity - worry: Not on file  . Food insecurity - inability: Not on file  . Transportation needs - medical: Not on file  . Transportation needs - non-medical: Not on file  Occupational History  . Not  on file  Tobacco Use  . Smoking status: Never Smoker  . Smokeless tobacco: Never Used  Substance and Sexual Activity  . Alcohol use: No    Alcohol/week: 0.0 oz  . Drug use: No  . Sexual activity: Yes    Birth control/protection: Other-see comments, Post-menopausal    Comment: Hysterectomy  Other Topics Concern  . Not on file  Social History Narrative  . Not on file   Allergies  Allergen Reactions  . Amoxicillin-Pot Clavulanate     Severe diarrhea  . Codeine Sulfate     REACTION: "hyper"  . Crestor [Rosuvastatin Calcium] Other (See Comments)    myalgia  . Erythromycin Base   . Invokana [Canagliflozin] Other (See Comments)    Recurrent yeast infections  . Irbesartan-Hydrochlorothiazide   . Latex   . Victoza [Liraglutide] Nausea Only   Family History  Problem Relation Age of Onset  . Diabetes Mother   . Arthritis Mother   . Cancer Mother        breast  . Alcohol abuse Father   . Arthritis Father   . Hypertension Maternal Grandfather   . Miscarriages / Stillbirths Paternal Grandfather   . Colon polyps Neg Hx   . Colon cancer Neg Hx   . Esophageal cancer Neg Hx   . Rectal  cancer Neg Hx   . Stomach cancer Neg Hx      Past medical history, social, surgical and family history all reviewed in electronic medical record.  No pertanent information unless stated regarding to the chief complaint.   Review of Systems:Review of systems updated and as accurate as of 10/15/17  No headache, visual changes, nausea, vomiting, diarrhea, constipation, dizziness, abdominal pain, skin rash, fevers, chills, night sweats, weight loss, swollen lymph nodes, body aches, joint swelling, muscle aches, chest pain, shortness of breath, mood changes.   Objective  There were no vitals taken for this visit. Systems examined below as of 10/15/17   General: No apparent distress alert and oriented x3 mood and affect normal, dressed appropriately.  HEENT: Pupils equal, extraocular movements intact    Respiratory: Patient's speak in full sentences and does not appear short of breath  Cardiovascular: No lower extremity edema, non tender, no erythema  Skin: Warm dry intact with no signs of infection or rash on extremities or on axial skeleton.  Abdomen: Soft nontender  Neuro: Cranial nerves II through XII are intact, neurovascularly intact in all extremities with 2+ DTRs and 2+ pulses.  Lymph: No lymphadenopathy of posterior or anterior cervical chain or axillae bilaterally.  Gait normal with good balance and coordination.  MSK:  Non tender with full range of motion and good stability and symmetric strength and tone of shoulders, elbows, wrist, knee and ankles bilaterally.     Impression and Recommendations:     This case required medical decision making of moderate complexity.      Note: This dictation was prepared with Dragon dictation along with smaller phrase technology. Any transcriptional errors that result from this process are unintentional.

## 2017-10-16 ENCOUNTER — Ambulatory Visit: Payer: Medicare Other | Admitting: Family Medicine

## 2017-10-24 ENCOUNTER — Ambulatory Visit: Payer: Medicare Other | Admitting: Family Medicine

## 2017-10-24 ENCOUNTER — Encounter: Payer: Self-pay | Admitting: Family Medicine

## 2017-10-24 VITALS — BP 120/70 | HR 107 | Temp 98.8°F | Wt 180.8 lb

## 2017-10-24 DIAGNOSIS — E11649 Type 2 diabetes mellitus with hypoglycemia without coma: Secondary | ICD-10-CM | POA: Diagnosis not present

## 2017-10-24 LAB — POCT GLYCOSYLATED HEMOGLOBIN (HGB A1C): Hemoglobin A1C: 8.4

## 2017-10-24 NOTE — Patient Instructions (Signed)
Let's plan on 3 month follow up Check blood sugars at various times of day- eg alternate fasting with pre-lunch, pre-supper, and before bedtime BRING IN YOUR BLOOD SUGARS TO REVIEW AT FOLLOW UP.

## 2017-10-24 NOTE — Progress Notes (Signed)
Subjective:     Patient ID: Dana Dyer, female   DOB: Apr 03, 1951, 66 y.o.   MRN: [id number]  HPI Follow-up type 2 diabetes. History of poor control. Unfortunately, patient did not bring any log of sugars today. She currently is on Lantus 56 units once daily and Humalog usually taking 5-8 units before meals. She had occasional hypoglycemic symptoms and scaled her Lantus back. She states her fasting blood sugars currently are fairly consistent between 80 and 100.  She remains on metformin. She could not tolerate either GLP-1 or S GLT-2 class.  She feels much better overall after starting medications for depression and has been walking more and plans to continue with that.  Past Medical History:  Diagnosis Date  . ALLERGIC RHINITIS 10/09/2007  . Arthritis    hips, spine  . DIABETES MELLITUS, UNCONTROLLED 03/16/2009  . FATIGUE 10/21/2008  . FIBROMYALGIA 10/09/2007  . GERD (gastroesophageal reflux disease)    diet  controlled, no meds  . H/O vaginal hysterectomy   . HYPERLIPIDEMIA 10/09/2007   diet controlled, no meds  . HYPERTENSION 10/09/2007  . Morton's neuroma   . OBSTRUCTIVE SLEEP APNEA 10/09/2007  . Plantar fasciitis    History - right  . Sleep apnea    Uses CPAP  . TRANSAMINASES, SERUM, ELEVATED 07/08/2009   Past Surgical History:  Procedure Laterality Date  . ABDOMINAL HYSTERECTOMY  1979   prolapsed uterus  . BASAL CELL CARCINOMA EXCISION    . BREAST BIOPSY     left 2012 stereo  . BREAST REDUCTION SURGERY Bilateral 1992  . COLONOSCOPY  2006   [facility]  . mamoplasty     reduction  . TONSILLECTOMY  1959  . TUBAL LIGATION  1976    reports that  has never smoked. she has never used smokeless tobacco. She reports that she does not drink alcohol or use drugs. family history includes Alcohol abuse in her father; Arthritis in her father and mother; Cancer in her mother; Diabetes in her mother; Hypertension in her maternal grandfather; Miscarriages / [place] in her paternal  grandfather. Allergies  Allergen Reactions  . Amoxicillin-Pot Clavulanate     Severe diarrhea  . Codeine Sulfate     REACTION: "hyper"  . Crestor [Rosuvastatin Calcium] Other (See Comments)    myalgia  . Erythromycin Base   . Invokana [Canagliflozin] Other (See Comments)    Recurrent yeast infections  . Irbesartan-Hydrochlorothiazide   . Latex   . Victoza [Liraglutide] Nausea Only     Review of Systems  Constitutional: Negative for fatigue.  Eyes: Negative for visual disturbance.  Respiratory: Negative for cough, chest tightness, shortness of breath and wheezing.   Cardiovascular: Negative for chest pain, palpitations and leg swelling.  Neurological: Negative for dizziness, seizures, syncope, weakness, light-headedness and headaches.       Objective:   Physical Exam  Constitutional: She appears well-developed and well-nourished.  Eyes: Pupils are equal, round, and reactive to light.  Neck: Neck supple. No JVD present. No thyromegaly present.  Cardiovascular: Normal rate and regular rhythm. Exam reveals no gallop.  Pulmonary/Chest: Effort normal and breath sounds normal. No respiratory distress. She has no wheezes. She has no rales.  Musculoskeletal: She exhibits no edema.  Neurological: She is alert.       Assessment:     Type 2 diabetes. Improving but suboptimal control with A1c today 8.4%    Plan:     -We have reiterated importance of bringing in home blood sugar log for [place] to review  to help with titration of her insulin. -We've advised that she check blood sugar several times a day and alternate between fasting, prelunch, presupper, daily at bedtime and bring in for review at follow-up in 3 months. -We have challenged her to lose some weight and establish her walking and exercise and hopefully we can continue to see this improving.  Eulas Post MD [facility] at [facility]

## 2017-11-01 ENCOUNTER — Other Ambulatory Visit: Payer: Self-pay | Admitting: *Deleted

## 2017-11-01 MED ORDER — INSULIN GLARGINE 100 UNIT/ML SOLOSTAR PEN: 72.0000 [IU] | PEN_INJECTOR | Freq: Every day | SUBCUTANEOUS | 3 refills | Status: DC

## 2017-11-23 ENCOUNTER — Other Ambulatory Visit: Payer: Self-pay | Admitting: Family Medicine

## 2017-11-24 ENCOUNTER — Other Ambulatory Visit: Payer: Self-pay | Admitting: *Deleted

## 2017-11-24 MED ORDER — GLUCOSE BLOOD VI STRP: ORAL_STRIP | 1 refills | Status: DC

## 2017-12-06 ENCOUNTER — Other Ambulatory Visit: Payer: Self-pay | Admitting: Family Medicine

## 2017-12-25 ENCOUNTER — Other Ambulatory Visit: Payer: Self-pay | Admitting: *Deleted

## 2017-12-25 MED ORDER — INSULIN GLARGINE 100 UNIT/ML SOLOSTAR PEN: 72.0000 [IU] | PEN_INJECTOR | Freq: Every day | SUBCUTANEOUS | 3 refills | Status: DC

## 2018-01-24 ENCOUNTER — Ambulatory Visit: Payer: Medicare Other | Admitting: Family Medicine

## 2018-02-06 ENCOUNTER — Ambulatory Visit: Payer: Medicare Other | Admitting: Family Medicine

## 2018-02-06 ENCOUNTER — Encounter: Payer: Self-pay | Admitting: Family Medicine

## 2018-02-06 VITALS — BP 120/80 | HR 94 | Temp 97.9°F | Wt 181.4 lb

## 2018-02-06 DIAGNOSIS — E785 Hyperlipidemia, unspecified: Secondary | ICD-10-CM

## 2018-02-06 DIAGNOSIS — B354 Tinea corporis: Secondary | ICD-10-CM | POA: Diagnosis not present

## 2018-02-06 DIAGNOSIS — E559 Vitamin D deficiency, unspecified: Secondary | ICD-10-CM | POA: Diagnosis not present

## 2018-02-06 DIAGNOSIS — E11649 Type 2 diabetes mellitus with hypoglycemia without coma: Secondary | ICD-10-CM | POA: Diagnosis not present

## 2018-02-06 DIAGNOSIS — E538 Deficiency of other specified B group vitamins: Secondary | ICD-10-CM

## 2018-02-06 DIAGNOSIS — I10 Essential (primary) hypertension: Secondary | ICD-10-CM

## 2018-02-06 LAB — LIPID PANEL
Cholesterol: 188 mg/dL (ref 0–200)
HDL: 66.3 mg/dL (ref >39.00)
LDL Cholesterol: 104 mg/dL — ABNORMAL HIGH (ref 0–99)
NONHDL: 121.3
Total CHOL/HDL Ratio: 3
Triglycerides: 89 mg/dL (ref 0.0–149.0)
VLDL: 17.8 mg/dL (ref 0.0–40.0)

## 2018-02-06 LAB — HEPATIC FUNCTION PANEL
ALT: 37 U/L — ABNORMAL HIGH (ref 0–35)
AST: 36 U/L (ref 0–37)
Albumin: 3.7 g/dL (ref 3.5–5.2)
Alkaline Phosphatase: 85 U/L (ref 39–117)
BILIRUBIN DIRECT: 0.2 mg/dL (ref 0.0–0.3)
BILIRUBIN TOTAL: 1 mg/dL (ref 0.2–1.2)
Total Protein: 6.7 g/dL (ref 6.0–8.3)

## 2018-02-06 LAB — VITAMIN B12: VITAMIN B 12: 260 pg/mL (ref 211–911)

## 2018-02-06 LAB — BASIC METABOLIC PANEL
BUN: 13 mg/dL (ref 6–23)
CALCIUM: 9.4 mg/dL (ref 8.4–10.5)
CO2: 32 mEq/L (ref 19–32)
CREATININE: 0.6 mg/dL (ref 0.40–1.20)
Chloride: 98 mEq/L (ref 96–112)
GFR: 105.52 mL/min (ref >60.00)
Glucose, Bld: 136 mg/dL — ABNORMAL HIGH (ref 70–99)
Potassium: 3.8 mEq/L (ref 3.5–5.1)
Sodium: 138 mEq/L (ref 135–145)

## 2018-02-06 LAB — VITAMIN D 25 HYDROXY (VIT D DEFICIENCY, FRACTURES): VITD: 40.5 ng/mL (ref 30.00–100.00)

## 2018-02-06 LAB — POCT GLYCOSYLATED HEMOGLOBIN (HGB A1C): HEMOGLOBIN A1C: 8.8

## 2018-02-06 MED ORDER — CICLOPIROX OLAMINE 0.77 % EX CREA
TOPICAL_CREAM | Freq: Two times a day (BID) | CUTANEOUS | 1 refills | Status: DC
Start: 2018-02-06 — End: 2018-06-25

## 2018-02-06 NOTE — Patient Instructions (Signed)
Check some blood sugars 2 hours AFTER meals with goal < 180. Slowly titrate up the Humalog- as discussed - for post-meal sugars > 180

## 2018-02-06 NOTE — Progress Notes (Signed)
Subjective:     Patient ID: Dana Dyer, female   DOB: [birth year], 67 y.o.   MRN: [id number]  HPI Patient seen for medical follow-up  Type 2 diabetes. History of poor control and poor compliance. Has recently been checking some postprandial blood sugars and has had to our three-hour postprandials consistently over 200. She is on Lantus 70 units once daily and takes Humalog but inconsistent dosing.  Hypertension. Medications reviewed. Blood pressure well controlled. No headaches. No dizziness. No chest pains.  Hyperlipidemia treated with Livalo. Every other day. Had myalgias and taking daily  New issue of pruritic skin rash lower abdominal area. Pruritic. She tried some type of essential old without resolution. Present for couple months.  Past Medical History:  Diagnosis Date  . ALLERGIC RHINITIS 10/09/2007  . Arthritis    hips, spine  . DIABETES MELLITUS, UNCONTROLLED 03/16/2009  . FATIGUE 10/21/2008  . FIBROMYALGIA 10/09/2007  . GERD (gastroesophageal reflux disease)    diet  controlled, no meds  . H/O vaginal hysterectomy   . HYPERLIPIDEMIA 10/09/2007   diet controlled, no meds  . HYPERTENSION 10/09/2007  . Morton's neuroma   . OBSTRUCTIVE SLEEP APNEA 10/09/2007  . Plantar fasciitis    History - right  . Sleep apnea    Uses CPAP  . TRANSAMINASES, SERUM, ELEVATED 07/08/2009   Past Surgical History:  Procedure Laterality Date  . ABDOMINAL HYSTERECTOMY  1979   prolapsed uterus  . BASAL CELL CARCINOMA EXCISION    . BREAST BIOPSY     left 2012 stereo  . BREAST REDUCTION SURGERY Bilateral 1992  . COLONOSCOPY  2006   [facility]  . mamoplasty     reduction  . TONSILLECTOMY  1959  . TUBAL LIGATION  1976    reports that  has never smoked. she has never used smokeless tobacco. She reports that she does not drink alcohol or use drugs. family history includes Alcohol abuse in her father; Arthritis in her father and mother; Cancer in her mother; Diabetes in her mother; Hypertension in  her maternal grandfather; Miscarriages / [place] in her paternal grandfather. Allergies  Allergen Reactions  . Amoxicillin-Pot Clavulanate     Severe diarrhea  . Codeine Sulfate     REACTION: "hyper"  . Crestor [Rosuvastatin Calcium] Other (See Comments)    myalgia  . Erythromycin Base   . Invokana [Canagliflozin] Other (See Comments)    Recurrent yeast infections  . Irbesartan-Hydrochlorothiazide   . Latex   . Victoza [Liraglutide] Nausea Only     Review of Systems  Constitutional: Negative for fatigue and unexpected weight change.  Eyes: Negative for visual disturbance.  Respiratory: Negative for cough, chest tightness, shortness of breath and wheezing.   Cardiovascular: Negative for chest pain, palpitations and leg swelling.  Endocrine: Negative for polydipsia and polyuria.  Skin: Positive for rash.  Neurological: Negative for dizziness, seizures, syncope, weakness, light-headedness and headaches.       Objective:   Physical Exam  Constitutional: She appears well-developed and well-nourished.  Eyes: Pupils are equal, round, and reactive to light.  Neck: Neck supple. No JVD present. No thyromegaly present.  Cardiovascular: Normal rate and regular rhythm. Exam reveals no gallop.  Pulmonary/Chest: Effort normal and breath sounds normal. No respiratory distress. She has no wheezes. She has no rales.  Musculoskeletal: She exhibits no edema.  Neurological: She is alert.  Skin: Rash noted.  Approximately 1.5 cm rash lower mid abdomen. Scaling near the center and slightly elevated fairly well demarcated ring around the  border       Assessment:     #1 type 2 diabetes. Poorly controlled with history of poor compliance  #2 hypertension stable and at goal  #3 hyperlipidemia  #4 tinea corporis    Plan:     -Loprox cream twice daily and touch base if rash not resolving in 3-4 weeks -We recommended checking some frequent postprandial blood sugars and if blood sugars  consistently greater than 180 slowly titrate up her Humalog -Check further labs today with lipid panel, hepatic, basic metabolic panel, Y92 level -Engage in more consistent exercise and routine follow-up in 3 months  Eulas Post MD [facility] at [facility]

## 2018-02-26 ENCOUNTER — Other Ambulatory Visit: Payer: Self-pay | Admitting: *Deleted

## 2018-02-26 MED ORDER — INSULIN GLARGINE 100 UNIT/ML SOLOSTAR PEN: 72.0000 [IU] | PEN_INJECTOR | Freq: Every day | SUBCUTANEOUS | 3 refills | Status: DC

## 2018-02-27 ENCOUNTER — Other Ambulatory Visit: Payer: Self-pay | Admitting: *Deleted

## 2018-02-27 MED ORDER — INSULIN GLARGINE 100 UNIT/ML SOLOSTAR PEN: PEN_INJECTOR | SUBCUTANEOUS | 3 refills | Status: DC

## 2018-03-01 ENCOUNTER — Other Ambulatory Visit: Payer: Self-pay | Admitting: *Deleted

## 2018-03-01 MED ORDER — INSULIN GLARGINE 100 UNIT/ML SOLOSTAR PEN: PEN_INJECTOR | SUBCUTANEOUS | 1 refills | Status: DC

## 2018-03-01 NOTE — Telephone Encounter (Signed)
Rx done. 

## 2018-03-03 ENCOUNTER — Other Ambulatory Visit: Payer: Self-pay | Admitting: Family Medicine

## 2018-03-09 ENCOUNTER — Telehealth: Payer: Self-pay | Admitting: Family Medicine

## 2018-03-09 DIAGNOSIS — E1165 Type 2 diabetes mellitus with hyperglycemia: Secondary | ICD-10-CM

## 2018-03-09 NOTE — Telephone Encounter (Signed)
Ok for referral?

## 2018-03-09 NOTE — Telephone Encounter (Signed)
Spoke with patient and she thought she was going to get a referral for a diabetes educator through [facility].  Okay to refer?

## 2018-03-09 NOTE — Telephone Encounter (Signed)
Referral placed.

## 2018-03-09 NOTE — Telephone Encounter (Signed)
Copied from [place]. Topic: Quick Communication - See Telephone Encounter >> Mar 09, 2018 11:34 AM Cleaster Corin, NT wrote: CRM for notification. See Telephone encounter for: 03/09/18.  Terry(nurse) from [facility] is needing pts. Last blood pressure reading and A1C needed Coralyn Mark can be reached at [phone] (when leaving vm select confidential setting)

## 2018-03-14 ENCOUNTER — Other Ambulatory Visit: Payer: Self-pay | Admitting: Family Medicine

## 2018-03-17 ENCOUNTER — Other Ambulatory Visit: Payer: Self-pay | Admitting: Family Medicine

## 2018-04-02 ENCOUNTER — Telehealth: Payer: Self-pay | Admitting: Family Medicine

## 2018-04-02 NOTE — Telephone Encounter (Signed)
Copied from [place] [id number]. Topic: Quick Communication - See Telephone Encounter >> Apr 02, 2018  2:26 PM Cleaster Corin, NT wrote: CRM for notification. See Telephone encounter for: 04/02/18.  Pt. Calling to let Dr. Elease Hashimoto know that the med. ciclopirox (LOPROX) 0.77 % cream [[id number]] isn't working for her and would like something else called in  [id number] [place] [id number]: [phone]: [phone]

## 2018-04-02 NOTE — Telephone Encounter (Signed)
Let's try Fluconazole 100 mg po daily for 7 days.  If not clearing with that, recommend follow up and consider skin biopsy.

## 2018-04-04 MED ORDER — FLUCONAZOLE 100 MG PO TABS: 100.0000 mg | ORAL_TABLET | Freq: Every day | ORAL | 0 refills | Status: DC

## 2018-04-04 NOTE — Telephone Encounter (Signed)
Rx sent and Left message on machine for patient. 

## 2018-04-25 ENCOUNTER — Other Ambulatory Visit: Payer: Self-pay | Admitting: Family Medicine

## 2018-04-25 DIAGNOSIS — Z1231 Encounter for screening mammogram for malignant neoplasm of breast: Secondary | ICD-10-CM

## 2018-04-27 ENCOUNTER — Telehealth: Payer: Self-pay | Admitting: Family Medicine

## 2018-04-27 ENCOUNTER — Encounter: Payer: Self-pay | Admitting: Family Medicine

## 2018-04-27 NOTE — Telephone Encounter (Signed)
Copied from [place] [id number]. Topic: Quick Communication - See Telephone Encounter >> Apr 27, 2018  4:22 PM Antonieta Iba C wrote: CRM for notification. See Telephone encounter for: 04/27/18.   Pt sent provider a My-Chart message, pt would like to make sure that provider see message and advise.

## 2018-05-15 ENCOUNTER — Encounter: Payer: Self-pay | Admitting: Family Medicine

## 2018-05-23 ENCOUNTER — Other Ambulatory Visit: Payer: Self-pay | Admitting: Family Medicine

## 2018-05-29 ENCOUNTER — Ambulatory Visit
Admission: RE | Admit: 2018-05-29 | Discharge: 2018-05-29 | Disposition: A | Discharge status: Home / Self Care | Payer: Medicare Other | Source: Ambulatory Visit | Attending: Family Medicine | Admitting: Family Medicine

## 2018-05-29 DIAGNOSIS — Z1231 Encounter for screening mammogram for malignant neoplasm of breast: Secondary | ICD-10-CM

## 2018-05-30 ENCOUNTER — Other Ambulatory Visit: Payer: Self-pay | Admitting: Family Medicine

## 2018-06-25 ENCOUNTER — Ambulatory Visit (INDEPENDENT_AMBULATORY_CARE_PROVIDER_SITE_OTHER): Payer: Medicare Other | Admitting: Family Medicine

## 2018-06-25 ENCOUNTER — Encounter: Payer: Self-pay | Admitting: Family Medicine

## 2018-06-25 VITALS — BP 120/70 | HR 92 | Temp 98.0°F | Ht 62.25 in | Wt 184.3 lb

## 2018-06-25 DIAGNOSIS — I10 Essential (primary) hypertension: Secondary | ICD-10-CM

## 2018-06-25 DIAGNOSIS — R5383 Other fatigue: Secondary | ICD-10-CM | POA: Diagnosis not present

## 2018-06-25 DIAGNOSIS — E785 Hyperlipidemia, unspecified: Secondary | ICD-10-CM

## 2018-06-25 DIAGNOSIS — E11649 Type 2 diabetes mellitus with hypoglycemia without coma: Secondary | ICD-10-CM | POA: Diagnosis not present

## 2018-06-25 LAB — CBC WITH DIFFERENTIAL/PLATELET
Basophils Absolute: 0 10*3/uL (ref 0.0–0.1)
Basophils Relative: 0.6 % (ref 0.0–3.0)
Eosinophils Absolute: 0.1 10*3/uL (ref 0.0–0.7)
Eosinophils Relative: 3.1 % (ref 0.0–5.0)
HCT: 35.9 % — ABNORMAL LOW (ref 36.0–46.0)
Hemoglobin: 12 g/dL (ref 12.0–15.0)
LYMPHS ABS: 1.4 10*3/uL (ref 0.7–4.0)
Lymphocytes Relative: 30.3 % (ref 12.0–46.0)
MCHC: 33.6 g/dL (ref 30.0–36.0)
MCV: 84.5 fl (ref 78.0–100.0)
MONO ABS: 0.5 10*3/uL (ref 0.1–1.0)
MONOS PCT: 9.7 % (ref 3.0–12.0)
NEUTROS ABS: 2.7 10*3/uL (ref 1.4–7.7)
Neutrophils Relative %: 56.3 % (ref 43.0–77.0)
Platelets: 102 10*3/uL — ABNORMAL LOW (ref 150.0–400.0)
RBC: 4.24 Mil/uL (ref 3.87–5.11)
RDW: 14.5 % (ref 11.5–15.5)
WBC: 4.8 10*3/uL (ref 4.0–10.5)

## 2018-06-25 LAB — HEMOGLOBIN A1C: Hgb A1c MFr Bld: 8.8 % — ABNORMAL HIGH (ref 4.6–6.5)

## 2018-06-25 LAB — TSH: TSH: 1.72 u[IU]/mL (ref 0.35–4.50)

## 2018-06-25 NOTE — Progress Notes (Signed)
Subjective:     Patient ID: Dana Dyer, female   DOB: 1951/02/08, 67 y.o.   MRN: [id number]  HPI Patient here for medical follow-up and also with complaints of several months of some progressive fatigue. Chronic problems include history of obesity, depression, obstructive sleep apnea, hyperlipidemia, type 2 diabetes, hypertension, essential tremor, and fibromyalgia.  Obstructive sleep apnea history. On CPAP. Compliant with use. Frequent wakes up feeling sleepy. She plans to set up follow-up with her pulmonologist regarding her CPAP.  Bout with major depression last year. Is currently on Lexapro 10 mg daily and Wellbutrin. She feels her depression is stable. She has questions whether her medication could be causing some fatigue.  Type 2 diabetes. History of poor control. Poor compliance. Last A1c 8.8%. She has increased her Humalog since last visit and is currently taking 15 units twice daily along with Lantus 70 units once daily. No recent hypoglycemia.  No recent chest pains. No appetite or weight changes. No dyspnea with exertion  Past Medical History:  Diagnosis Date  . ALLERGIC RHINITIS 10/09/2007  . Arthritis    hips, spine  . DIABETES MELLITUS, UNCONTROLLED 03/16/2009  . FATIGUE 10/21/2008  . FIBROMYALGIA 10/09/2007  . GERD (gastroesophageal reflux disease)    diet  controlled, no meds  . H/O vaginal hysterectomy   . HYPERLIPIDEMIA 10/09/2007   diet controlled, no meds  . HYPERTENSION 10/09/2007  . Morton's neuroma   . OBSTRUCTIVE SLEEP APNEA 10/09/2007  . Plantar fasciitis    History - right  . Sleep apnea    Uses CPAP  . TRANSAMINASES, SERUM, ELEVATED 07/08/2009   Past Surgical History:  Procedure Laterality Date  . ABDOMINAL HYSTERECTOMY  1979   prolapsed uterus  . BASAL CELL CARCINOMA EXCISION    . BREAST BIOPSY     left 2012 stereo  . BREAST REDUCTION SURGERY Bilateral 1992  . COLONOSCOPY  2006   [facility]  . mamoplasty     reduction  . REDUCTION MAMMAPLASTY  Bilateral   . TONSILLECTOMY  1959  . TUBAL LIGATION  1976    reports that she has never smoked. She has never used smokeless tobacco. She reports that she does not drink alcohol or use drugs. family history includes Alcohol abuse in her father; Arthritis in her father and mother; Breast cancer in her maternal aunt; Breast cancer (age of onset: 22) in her mother; Cancer in her mother; Diabetes in her mother; Hypertension in her maternal grandfather; Miscarriages / [place] in her paternal grandfather. Allergies  Allergen Reactions  . Amoxicillin-Pot Clavulanate     Severe diarrhea  . Codeine Sulfate     REACTION: "hyper"  . Crestor [Rosuvastatin Calcium] Other (See Comments)    myalgia  . Erythromycin Base   . Invokana [Canagliflozin] Other (See Comments)    Recurrent yeast infections  . Irbesartan-Hydrochlorothiazide   . Latex   . Victoza [Liraglutide] Nausea Only     Review of Systems  Constitutional: Positive for fatigue. Negative for appetite change, chills, fever and unexpected weight change.  Eyes: Negative for visual disturbance.  Respiratory: Negative for cough, chest tightness, shortness of breath and wheezing.   Cardiovascular: Negative for chest pain, palpitations and leg swelling.  Genitourinary: Negative for dysuria.  Neurological: Negative for dizziness, seizures, syncope, weakness, light-headedness and headaches.       Objective:   Physical Exam  Constitutional: She appears well-developed and well-nourished.  Eyes: Pupils are equal, round, and reactive to light.  Neck: Neck supple. No JVD present. No  thyromegaly present.  Cardiovascular: Normal rate and regular rhythm. Exam reveals no gallop.  Pulmonary/Chest: Effort normal and breath sounds normal. No respiratory distress. She has no wheezes. She has no rales.  Musculoskeletal: She exhibits no edema.  Neurological: She is alert.       Assessment:     #1 fatigue. Suspect multifactorial. Known history of  obstructive sleep apnea but patient is already on CPAP. Her depression seems to be stable.  #2 type 2 diabetes with history of poor control and poor compliance  #3 hypertension stable and at goal  #4 history of major depressive episode currently stable on combination therapy as above    Plan:     -Check further labs with TSH, CBC, and repeat hemoglobin A1c -She is encouraged to continue follow-up with pulmonary regarding her obstructive sleep apnea -Consider reducing Lexapro from 10 to 5 mg daily if above labs normal -She has history of B12 deficiency with recent B12 level 260 and normal vitamin D level  Eulas Post MD [facility] at [facility]

## 2018-06-25 NOTE — Patient Instructions (Signed)
Consider reducing the Lexapro to 5 mg once daily to see if that helps any with the fatigue- if labs all OK.

## 2018-06-28 ENCOUNTER — Ambulatory Visit: Payer: Medicare Other | Admitting: Pulmonary Disease

## 2018-06-28 ENCOUNTER — Encounter: Payer: Self-pay | Admitting: Pulmonary Disease

## 2018-06-28 DIAGNOSIS — G4710 Hypersomnia, unspecified: Secondary | ICD-10-CM | POA: Diagnosis not present

## 2018-06-28 DIAGNOSIS — G4733 Obstructive sleep apnea (adult) (pediatric): Secondary | ICD-10-CM

## 2018-06-28 NOTE — Patient Instructions (Signed)
CPAP is working well Consider anti depressant change of no other cause found

## 2018-06-28 NOTE — Progress Notes (Signed)
   Subjective:    Patient ID: Dana Dyer, female    DOB: January 29, 1951, 67 y.o.   MRN: [id number]  HPI  67 year old for follow-up of OSA and allergic rhinitis  She has been using essential oils on her symptoms of allergic rhinitis have been controlled. CPAP is working well with nasal pillows, compliance is excellent. Download reviewed which shows more than 8 hours every night and good control of events with no leak. However since the spring she has been having increased somnolence including problems driving.  She is very active and plays golf and spends time outdoors. She had depressive symptoms in October of last year and Wellbutrin was added.  Denies residual snoring or daytime naps  Significant tests/ events reviewed  PSG 04/2005 showed mild OSA with AHI 12/hr, severe in REM with AHI 32/h --> corrected with CPAP +10 cm , pillows with chin strap ( confirmed by download on auto )  HST 06/2015 >>mild OSA 13/hr - new CPAP Rx   Review of Systems Patient denies significant dyspnea,cough, hemoptysis,  chest pain, palpitations, pedal edema, orthopnea, paroxysmal nocturnal dyspnea, lightheadedness, nausea, vomiting, abdominal or  leg pains      Objective:   Physical Exam  Gen. Pleasant, well-nourished, in no distress ENT - no thrush, no post nasal drip Neck: No JVD, no thyromegaly, no carotid bruits Lungs: no use of accessory muscles, no dullness to percussion, clear without rales or rhonchi  Cardiovascular: Rhythm regular, heart sounds  normal, no murmurs or gallops, no peripheral edema Musculoskeletal: No deformities, no cyanosis or clubbing        Assessment & Plan:

## 2018-06-28 NOTE — Assessment & Plan Note (Signed)
Does not seem to be related to OSA.  TSH normal. Seems to have started after antidepressant Wellbutrin was added, but this is supposed to be nonsedating.  Low platelets is being investigated.  Not a good candidate for stimulants.  Doubt narcolepsy.  No history of head injury  Depressive symptoms seem to be controlled now.  Would consider changing antidepressant if somnolence persists

## 2018-06-28 NOTE — Assessment & Plan Note (Signed)
CPAP is working well Consider anti depressant change of no other cause found  Weight loss encouraged, compliance with goal of at least 4-6 hrs every night is the expectation. Advised against medications with sedative side effects Cautioned against driving when sleepy - understanding that sleepiness will vary on a day to day basis

## 2018-07-10 ENCOUNTER — Telehealth: Payer: Self-pay | Admitting: *Deleted

## 2018-07-10 ENCOUNTER — Other Ambulatory Visit: Payer: Self-pay | Admitting: Family Medicine

## 2018-07-10 LAB — HM DIABETES EYE EXAM

## 2018-07-10 NOTE — Telephone Encounter (Signed)
Entered in error

## 2018-07-10 NOTE — Telephone Encounter (Deleted)
Left message on machine for patient to reschedule appointment 08/03/18 because Dr Elease Hashimoto will not be in the office.  Okay to schedule 08/02/18 if patient would like.  CRM created

## 2018-07-11 ENCOUNTER — Encounter: Payer: Self-pay | Admitting: Family Medicine

## 2018-07-11 ENCOUNTER — Other Ambulatory Visit (INDEPENDENT_AMBULATORY_CARE_PROVIDER_SITE_OTHER): Payer: Medicare Other

## 2018-07-11 DIAGNOSIS — D696 Thrombocytopenia, unspecified: Secondary | ICD-10-CM | POA: Diagnosis not present

## 2018-07-11 LAB — CBC WITH DIFFERENTIAL/PLATELET
Basophils Absolute: 0 10*3/uL (ref 0.0–0.1)
Basophils Relative: 0.4 % (ref 0.0–3.0)
EOS PCT: 2.7 % (ref 0.0–5.0)
Eosinophils Absolute: 0.1 10*3/uL (ref 0.0–0.7)
HEMATOCRIT: 34.4 % — AB (ref 36.0–46.0)
HEMOGLOBIN: 11.5 g/dL — AB (ref 12.0–15.0)
LYMPHS PCT: 32 % (ref 12.0–46.0)
Lymphs Abs: 1.3 10*3/uL (ref 0.7–4.0)
MCHC: 33.3 g/dL (ref 30.0–36.0)
MCV: 84.8 fl (ref 78.0–100.0)
Monocytes Absolute: 0.4 10*3/uL (ref 0.1–1.0)
Monocytes Relative: 9.6 % (ref 3.0–12.0)
Neutro Abs: 2.2 10*3/uL (ref 1.4–7.7)
Neutrophils Relative %: 55.3 % (ref 43.0–77.0)
Platelets: 64 10*3/uL — ABNORMAL LOW (ref 150.0–400.0)
RBC: 4.06 Mil/uL (ref 3.87–5.11)
RDW: 14.9 % (ref 11.5–15.5)
WBC: 4 10*3/uL (ref 4.0–10.5)

## 2018-07-12 NOTE — Progress Notes (Signed)
amb hem

## 2018-07-12 NOTE — Addendum Note (Signed)
Addended by: Agnes Overall on: 07/12/2018 11:23 AM   Modules accepted: Orders

## 2018-07-13 ENCOUNTER — Encounter: Payer: Self-pay | Admitting: Internal Medicine

## 2018-07-13 ENCOUNTER — Telehealth: Payer: Self-pay | Admitting: Internal Medicine

## 2018-07-13 ENCOUNTER — Other Ambulatory Visit: Payer: Medicare Other

## 2018-07-13 NOTE — Telephone Encounter (Signed)
New hematology referral received from Dr. Elsworth Soho. Pt has been scheduled to see Dr. Julien Nordmann on 8/21 at 1130am. Pt aware to arrive 30 minutes early. Letter mailed.

## 2018-07-25 ENCOUNTER — Inpatient Hospital Stay: Payer: Medicare Other

## 2018-07-25 ENCOUNTER — Inpatient Hospital Stay: Payer: Medicare Other | Attending: Internal Medicine | Admitting: Internal Medicine

## 2018-07-25 ENCOUNTER — Telehealth: Payer: Self-pay | Admitting: Internal Medicine

## 2018-07-25 ENCOUNTER — Encounter: Payer: Self-pay | Admitting: Internal Medicine

## 2018-07-25 VITALS — BP 156/77 | HR 105 | Temp 97.8°F | Resp 18 | Ht 62.25 in | Wt 186.0 lb

## 2018-07-25 DIAGNOSIS — Z794 Long term (current) use of insulin: Secondary | ICD-10-CM

## 2018-07-25 DIAGNOSIS — E119 Type 2 diabetes mellitus without complications: Secondary | ICD-10-CM

## 2018-07-25 DIAGNOSIS — D696 Thrombocytopenia, unspecified: Secondary | ICD-10-CM | POA: Diagnosis present

## 2018-07-25 DIAGNOSIS — I10 Essential (primary) hypertension: Secondary | ICD-10-CM | POA: Diagnosis not present

## 2018-07-25 DIAGNOSIS — M797 Fibromyalgia: Secondary | ICD-10-CM

## 2018-07-25 DIAGNOSIS — Z803 Family history of malignant neoplasm of breast: Secondary | ICD-10-CM | POA: Diagnosis not present

## 2018-07-25 DIAGNOSIS — K219 Gastro-esophageal reflux disease without esophagitis: Secondary | ICD-10-CM | POA: Diagnosis not present

## 2018-07-25 DIAGNOSIS — R5383 Other fatigue: Secondary | ICD-10-CM

## 2018-07-25 DIAGNOSIS — Z79899 Other long term (current) drug therapy: Secondary | ICD-10-CM | POA: Diagnosis not present

## 2018-07-25 DIAGNOSIS — Z85828 Personal history of other malignant neoplasm of skin: Secondary | ICD-10-CM

## 2018-07-25 DIAGNOSIS — E785 Hyperlipidemia, unspecified: Secondary | ICD-10-CM

## 2018-07-25 LAB — CBC WITH DIFFERENTIAL (CANCER CENTER ONLY)
BASOS ABS: 0 10*3/uL (ref 0.0–0.1)
Basophils Relative: 0 %
Eosinophils Absolute: 0.1 10*3/uL (ref 0.0–0.5)
Eosinophils Relative: 2 %
HEMATOCRIT: 35.4 % (ref 34.8–46.6)
HEMOGLOBIN: 11.7 g/dL (ref 11.6–15.9)
Lymphocytes Relative: 28 %
Lymphs Abs: 1.3 10*3/uL (ref 0.9–3.3)
MCH: 28.1 pg (ref 25.1–34.0)
MCHC: 33.1 g/dL (ref 31.5–36.0)
MCV: 85.1 fL (ref 79.5–101.0)
MONO ABS: 0.5 10*3/uL (ref 0.1–0.9)
Monocytes Relative: 11 %
NEUTROS ABS: 2.6 10*3/uL (ref 1.5–6.5)
Neutrophils Relative %: 59 %
Platelet Count: 119 10*3/uL — ABNORMAL LOW (ref 145–400)
RBC: 4.16 MIL/uL (ref 3.70–5.45)
RDW: 14.3 % (ref 11.2–14.5)
WBC: 4.5 10*3/uL (ref 3.9–10.3)

## 2018-07-25 LAB — FERRITIN: Ferritin: 11 ng/mL (ref 11–307)

## 2018-07-25 LAB — IRON AND TIBC
Iron: 60 ug/dL (ref 41–142)
SATURATION RATIOS: 12 % — AB (ref 21–57)
TIBC: 489 ug/dL — ABNORMAL HIGH (ref 236–444)
UIBC: 429 ug/dL

## 2018-07-25 LAB — PLATELET BY CITRATE

## 2018-07-25 LAB — VITAMIN B12: Vitamin B-12: 257 pg/mL (ref 180–914)

## 2018-07-25 LAB — FOLATE: FOLATE: 38.5 ng/mL (ref >5.9)

## 2018-07-25 NOTE — Telephone Encounter (Signed)
Appts scheduled AVS/Calendar printed per 8/21 los

## 2018-07-25 NOTE — Progress Notes (Signed)
[place] Telephone:([phone]   [phone]  CONSULT NOTE  REFERRING PHYSICIAN: Dr. Carolann Littler  REASON FOR CONSULTATION:  67 years old white female with low platelets.  HPI Dana Dyer is a 67 y.o. female with past medical history significant for hypertension, diabetes mellitus, fibromyalgia, sleep apnea, GERD, dyslipidemia, and basal cell carcinoma of the skin.  The patient has been complaining of fatigue for several years.  She has a history of fibromyalgia and she was managed by Dr. Estanislado Pandy in the past.  The patient was seen recently by Dr. Elease Hashimoto for routine evaluation and CBC was performed on 06/25/2018 and that showed platelets count down to 102,000.  It has been normal for the last few years.  Repeat CBC on 07/11/2018 showed further decrease in her platelets count down to 64,000.  The patient was referred to me today for evaluation and recommendation regarding her condition.  She has not started any new medication last year except for Wellbutrin.  She has been on losartan for several years.  When seen today the patient is feeling fine with no concerning complaints except for fatigue.  She is not taking any NSAIDs.  She denied having any bleeding, bruises or ecchymosis.  The patient has no chest pain, shortness of breath, cough or hemoptysis.  She denied having any weight loss or night sweats.  She has no nausea, vomiting, diarrhea or constipation. Family history significant for mother with breast cancer.  Father has osteoarthritis, alcohol abuse and pulmonary hypertension. The patient is married and has 2 children.  She was accompanied today by her husband Deidre Ala.  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  She has no history for smoking but drinks alcohol occasionally and no history of drug abuse.  HPI  Past Medical History:  Diagnosis Date  . ALLERGIC RHINITIS 10/09/2007  . Arthritis    hips, spine  . DIABETES  MELLITUS, UNCONTROLLED 03/16/2009  . FATIGUE 10/21/2008  . FIBROMYALGIA 10/09/2007  . GERD (gastroesophageal reflux disease)    diet  controlled, no meds  . H/O vaginal hysterectomy   . HYPERLIPIDEMIA 10/09/2007   diet controlled, no meds  . HYPERTENSION 10/09/2007  . Morton's neuroma   . OBSTRUCTIVE SLEEP APNEA 10/09/2007  . Plantar fasciitis    History - right  . Sleep apnea    Uses CPAP  . TRANSAMINASES, SERUM, ELEVATED 07/08/2009    Past Surgical History:  Procedure Laterality Date  . ABDOMINAL HYSTERECTOMY  1979   prolapsed uterus  . BASAL CELL CARCINOMA EXCISION    . BREAST BIOPSY     left 2012 stereo  . BREAST REDUCTION SURGERY Bilateral 1992  . COLONOSCOPY  2006   [facility]  . mamoplasty     reduction  . REDUCTION MAMMAPLASTY Bilateral   . TONSILLECTOMY  1959  . TUBAL LIGATION  1976    Family History  Problem Relation Age of Onset  . Diabetes Mother   . Arthritis Mother   . Cancer Mother        breast  . Breast cancer Mother 65  . Alcohol abuse Father   . Arthritis Father   . Hypertension Maternal Grandfather   . Miscarriages / Stillbirths Paternal Grandfather   . Breast cancer Maternal Aunt        diagnosed in her 40's  . Colon polyps Neg Hx   . Colon cancer Neg Hx   . Esophageal cancer Neg Hx   .  Rectal cancer Neg Hx   . Stomach cancer Neg Hx     Social History Social History   Tobacco Use  . Smoking status: Never Smoker  . Smokeless tobacco: Never Used  Substance Use Topics  . Alcohol use: No    Alcohol/week: 0.0 standard drinks  . Drug use: No    Allergies  Allergen Reactions  . Amoxicillin-Pot Clavulanate     Severe diarrhea  . Codeine Sulfate     REACTION: "hyper"  . Crestor [Rosuvastatin Calcium] Other (See Comments)    myalgia  . Erythromycin Base   . Invokana [Canagliflozin] Other (See Comments)    Recurrent yeast infections  . Irbesartan-Hydrochlorothiazide   . Latex   . Victoza [Liraglutide] Nausea Only    Current Outpatient  Medications  Medication Sig Dispense Refill  . amLODipine (NORVASC) 5 MG tablet TAKE 1 TABLET BY MOUTH EVERY DAY 90 tablet 2  . BD ULTRA-FINE PEN NEEDLES 29G X 12.7MM MISC USE AS DIRECTED 3 TIMES DAILY. 300 each 2  . buPROPion (WELLBUTRIN XL) 150 MG 24 hr tablet Take 1 tablet (150 mg total) daily by mouth. 90 tablet 1  . escitalopram (LEXAPRO) 10 MG tablet TAKE ONE TABLET BY MOUTH ONE TIME DAILY 90 tablet 2  . HUMALOG KWIKPEN 100 UNIT/ML KiwkPen INJECT 5 UNITS WITH LUNCH AND 5 UNITS WITH SUPPER AS DIRECTED 3 pen 5  . Insulin Glargine (LANTUS SOLOSTAR) 100 UNIT/ML Solostar Pen INJECT 70 UNITS DAILY AS DIRECTED 45 pen 2  . losartan-hydrochlorothiazide (HYZAAR) 100-25 MG tablet TAKE 1 TABLET BY MOUTH ONCE DAILY 90 tablet 1  . metFORMIN (GLUCOPHAGE) 1000 MG tablet TAKE 1 TABLET BY MOUTH TWICE A DAY 180 tablet 2  . ONETOUCH DELICA LANCETS 79K MISC CHECK 3 TIMES DAILY. 300 each 1  . ONETOUCH VERIO test strip TEST 3 TIMES DAILY DX E11.9 300 each 1   Current Facility-Administered Medications  Medication Dose Route Frequency Provider Last Rate Last Dose  . TDaP (BOOSTRIX) injection 0.5 mL  0.5 mL Intramuscular Once Burchette, Alinda Sierras, MD        Review of Systems  Constitutional: positive for fatigue Eyes: negative Ears, nose, mouth, throat, and face: negative Respiratory: negative Cardiovascular: negative Gastrointestinal: negative Genitourinary:negative Integument/breast: negative Hematologic/lymphatic: negative Musculoskeletal:negative Neurological: negative Behavioral/Psych: negative Endocrine: negative Allergic/Immunologic: negative  Physical Exam  WIO:XBDZH, healthy, no distress, well nourished and well developed SKIN: skin color, texture, turgor are normal, no rashes or significant lesions HEAD: Normocephalic, No masses, lesions, tenderness or abnormalities EYES: normal, PERRLA, Conjunctiva are pink and non-injected EARS: External ears normal, Canals clear OROPHARYNX:no  exudate, no erythema and lips, buccal mucosa, and tongue normal  NECK: supple, no adenopathy, no JVD LYMPH:  no palpable lymphadenopathy, no hepatosplenomegaly BREAST:not examined LUNGS: clear to auscultation , and palpation HEART: regular rate & rhythm, no murmurs and no gallops ABDOMEN:abdomen soft, non-tender, normal bowel sounds and no masses or organomegaly BACK: Back symmetric, no curvature., No CVA tenderness EXTREMITIES:no joint deformities, effusion, or inflammation, no edema  NEURO: alert & oriented x 3 with fluent speech, no focal motor/sensory deficits  PERFORMANCE STATUS: ECOG 1  LABORATORY DATA: Lab Results  Component Value Date   WBC 4.5 07/25/2018   HGB 11.7 07/25/2018   HCT 35.4 07/25/2018   MCV 85.1 07/25/2018   PLT 119 (L) 07/25/2018      Chemistry      Component Value Date/Time   NA 138 02/06/2018 0906   K 3.8 02/06/2018 0906   CL 98 02/06/2018 0906  CO2 32 02/06/2018 0906   BUN 13 02/06/2018 0906   CREATININE 0.60 02/06/2018 0906      Component Value Date/Time   CALCIUM 9.4 02/06/2018 0906   ALKPHOS 85 02/06/2018 0906   AST 36 02/06/2018 0906   ALT 37 (H) 02/06/2018 0906   BILITOT 1.0 02/06/2018 0906       RADIOGRAPHIC STUDIES: No results found.  ASSESSMENT: This is a very pleasant 67 years old white female presented for evaluation of thrombocytopenia.  The patient has no finding concerning for TTP.  Her thrombocytopenia is likely drug-induced from Wellbutrin or losartan versus immune mediated thrombocytopenia. Repeat CBC today showed improvement of her platelets count up to 119,000.  The patient is currently asymptomatic.  PLAN: I had a lengthy discussion with the patient and her husband about her condition. I will order several studies to rule out any other etiology for her thrombocytopenia including repeat CBC, comprehensive metabolic panel, iron study, ferritin, vitamin B12, serum folate, hepatitis panel, ANA and HIV test. I will also order  ultrasound of the abdomen to rule out splenomegaly. I will see the patient back for follow-up visit in 1 months for evaluation and repeat CBC. She was advised to call if she has any concerning symptoms or bleeding issues in the interval. The patient voices understanding of current disease status and treatment options and is in agreement with the current care plan.  All questions were answered. The patient knows to call the clinic with any problems, questions or concerns. We can certainly see the patient much sooner if necessary.  Thank you so much for allowing me to participate in the care of MIREILLE LACOMBE. I will continue to follow up the patient with you and assist in her care.  I spent 40 minutes counseling the patient face to face. The total time spent in the appointment was 60 minutes.  Disclaimer: This note was dictated with voice recognition software. Similar sounding words can inadvertently be transcribed and may not be corrected upon review.   Eilleen Kempf July 25, 2018, 12:05 PM

## 2018-07-26 LAB — HEPATITIS PANEL, ACUTE
HCV Ab: <0.1 s/co ratio (ref 0.0–0.9)
Hep A IgM: NEGATIVE
Hep B C IgM: NEGATIVE
Hepatitis B Surface Ag: NEGATIVE

## 2018-07-26 LAB — HIV ANTIBODY (ROUTINE TESTING W REFLEX): HIV Screen 4th Generation wRfx: NONREACTIVE

## 2018-07-26 LAB — ANTINUCLEAR ANTIBODIES, IFA: ANA Ab, IFA: NEGATIVE

## 2018-08-02 ENCOUNTER — Ambulatory Visit (HOSPITAL_COMMUNITY)
Admission: RE | Admit: 2018-08-02 | Discharge: 2018-08-02 | Disposition: A | Discharge status: Home / Self Care | Payer: Medicare Other | Source: Ambulatory Visit | Attending: Internal Medicine | Admitting: Internal Medicine

## 2018-08-02 DIAGNOSIS — R161 Splenomegaly, not elsewhere classified: Secondary | ICD-10-CM | POA: Diagnosis not present

## 2018-08-02 DIAGNOSIS — D696 Thrombocytopenia, unspecified: Secondary | ICD-10-CM | POA: Diagnosis present

## 2018-08-14 ENCOUNTER — Telehealth: Payer: Self-pay | Admitting: Oncology

## 2018-08-14 NOTE — Telephone Encounter (Signed)
Tried to reach regarding voicemail

## 2018-08-23 ENCOUNTER — Inpatient Hospital Stay: Payer: Medicare Other | Attending: Internal Medicine | Admitting: Oncology

## 2018-08-23 ENCOUNTER — Encounter: Payer: Self-pay | Admitting: Oncology

## 2018-08-23 ENCOUNTER — Inpatient Hospital Stay: Payer: Medicare Other

## 2018-08-23 ENCOUNTER — Other Ambulatory Visit: Payer: Self-pay

## 2018-08-23 DIAGNOSIS — K219 Gastro-esophageal reflux disease without esophagitis: Secondary | ICD-10-CM | POA: Diagnosis not present

## 2018-08-23 DIAGNOSIS — E119 Type 2 diabetes mellitus without complications: Secondary | ICD-10-CM | POA: Diagnosis not present

## 2018-08-23 DIAGNOSIS — D696 Thrombocytopenia, unspecified: Secondary | ICD-10-CM | POA: Diagnosis present

## 2018-08-23 DIAGNOSIS — Z79899 Other long term (current) drug therapy: Secondary | ICD-10-CM | POA: Diagnosis not present

## 2018-08-23 DIAGNOSIS — I10 Essential (primary) hypertension: Secondary | ICD-10-CM | POA: Insufficient documentation

## 2018-08-23 DIAGNOSIS — R161 Splenomegaly, not elsewhere classified: Secondary | ICD-10-CM | POA: Insufficient documentation

## 2018-08-23 DIAGNOSIS — M1990 Unspecified osteoarthritis, unspecified site: Secondary | ICD-10-CM | POA: Diagnosis not present

## 2018-08-23 DIAGNOSIS — K7460 Unspecified cirrhosis of liver: Secondary | ICD-10-CM | POA: Diagnosis not present

## 2018-08-23 DIAGNOSIS — E785 Hyperlipidemia, unspecified: Secondary | ICD-10-CM | POA: Diagnosis not present

## 2018-08-23 DIAGNOSIS — R5383 Other fatigue: Secondary | ICD-10-CM

## 2018-08-23 DIAGNOSIS — Z794 Long term (current) use of insulin: Secondary | ICD-10-CM | POA: Diagnosis not present

## 2018-08-23 DIAGNOSIS — M797 Fibromyalgia: Secondary | ICD-10-CM | POA: Insufficient documentation

## 2018-08-23 DIAGNOSIS — G4733 Obstructive sleep apnea (adult) (pediatric): Secondary | ICD-10-CM | POA: Diagnosis not present

## 2018-08-23 DIAGNOSIS — Z9989 Dependence on other enabling machines and devices: Secondary | ICD-10-CM | POA: Insufficient documentation

## 2018-08-23 LAB — CBC WITH DIFFERENTIAL (CANCER CENTER ONLY)
BASOS PCT: 0 %
Basophils Absolute: 0 10*3/uL (ref 0.0–0.1)
Eosinophils Absolute: 0.1 10*3/uL (ref 0.0–0.5)
Eosinophils Relative: 3 %
HEMATOCRIT: 35.6 % (ref 34.8–46.6)
HEMOGLOBIN: 11.9 g/dL (ref 11.6–15.9)
LYMPHS PCT: 32 %
Lymphs Abs: 1.3 10*3/uL (ref 0.9–3.3)
MCH: 28 pg (ref 25.1–34.0)
MCHC: 33.5 g/dL (ref 31.5–36.0)
MCV: 83.6 fL (ref 79.5–101.0)
MONOS PCT: 13 %
Monocytes Absolute: 0.5 10*3/uL (ref 0.1–0.9)
NEUTROS ABS: 2.1 10*3/uL (ref 1.5–6.5)
Neutrophils Relative %: 52 %
Platelet Count: 130 10*3/uL — ABNORMAL LOW (ref 145–400)
RBC: 4.26 MIL/uL (ref 3.70–5.45)
RDW: 14.8 % — AB (ref 11.2–14.5)
WBC Count: 4 10*3/uL (ref 3.9–10.3)

## 2018-08-23 NOTE — Patient Instructions (Signed)
Iron-Rich Diet Iron is a mineral that helps your body to produce hemoglobin. Hemoglobin is a protein in your red blood cells that carries oxygen to your body's tissues. Eating too little iron may cause you to feel weak and tired, and it can increase your risk for infection. Eating enough iron is necessary for your body's metabolism, muscle function, and nervous system. Iron is naturally found in many foods. It can also be added to foods or fortified in foods. There are two types of dietary iron:  Heme iron. Heme iron is absorbed by the body more easily than nonheme iron. Heme iron is found in meat, poultry, and fish.  Nonheme iron. Nonheme iron is found in dietary supplements, iron-fortified grains, beans, and vegetables.  You may need to follow an iron-rich diet if:  You have been diagnosed with iron deficiency or iron-deficiency anemia.  You have a condition that prevents you from absorbing dietary iron, such as: ? Infection in your intestines. ? Celiac disease. This involves long-lasting (chronic) inflammation of your intestines.  You do not eat enough iron.  You eat a diet that is high in foods that impair iron absorption.  You have lost a lot of blood.  You have heavy bleeding during your menstrual cycle.  You are pregnant.  What is my plan? Your health care provider may help you to determine how much iron you need per day based on your condition. Generally, when a person consumes sufficient amounts of iron in the diet, the following iron needs are met:  Men. ? 14-18 years old: 11 mg per day. ? 19-50 years old: 8 mg per day.  Women. ? 14-18 years old: 15 mg per day. ? 19-50 years old: 18 mg per day. ? Over 50 years old: 8 mg per day. ? Pregnant women: 27 mg per day. ? Breastfeeding women: 9 mg per day.  What do I need to know about an iron-rich diet?  Eat fresh fruits and vegetables that are high in vitamin C along with foods that are high in iron. This will help  increase the amount of iron that your body absorbs from food, especially with foods containing nonheme iron. Foods that are high in vitamin C include oranges, peppers, tomatoes, and mango.  Take iron supplements only as directed by your health care provider. Overdose of iron can be life-threatening. If you were prescribed iron supplements, take them with orange juice or a vitamin C supplement.  Cook foods in pots and pans that are made from iron.  Eat nonheme iron-containing foods alongside foods that are high in heme iron. This helps to improve your iron absorption.  Certain foods and drinks contain compounds that impair iron absorption. Avoid eating these foods in the same meal as iron-rich foods or with iron supplements. These include: ? Coffee, black tea, and red wine. ? Milk, dairy products, and foods that are high in calcium. ? Beans, soybeans, and peas. ? Whole grains.  When eating foods that contain both nonheme iron and compounds that impair iron absorption, follow these tips to absorb iron better. ? Soak beans overnight before cooking. ? Soak whole grains overnight and drain them before using. ? Ferment flours before baking, such as using yeast in bread dough. What foods can I eat? Grains Iron-fortified breakfast cereal. Iron-fortified whole-wheat bread. Enriched rice. Sprouted grains. Vegetables Spinach. Potatoes with skin. Green peas. Broccoli. Red and green bell peppers. Fermented vegetables. Fruits Prunes. Raisins. Oranges. Strawberries. Mango. Grapefruit. Meats and Other Protein Sources   Beef liver. Oysters. Beef. Shrimp. [place]. Chicken. [place]. Sardines. Chickpeas. Nuts. Tofu. Beverages Tomato juice. Fresh orange juice. Prune juice. Hibiscus tea. Fortified instant breakfast shakes. Condiments Tahini. Fermented soy sauce. Sweets and Desserts Black-strap molasses. Other Wheat germ. The items listed above may not be a complete list of recommended foods or beverages.  Contact your dietitian for more options. What foods are not recommended? Grains Whole grains. Bran cereal. Bran flour. Oats. Vegetables Artichokes. Brussels sprouts. Kale. Fruits Blueberries. Raspberries. Strawberries. Figs. Meats and Other Protein Sources Soybeans. Products made from soy protein. Dairy Milk. Cream. Cheese. Yogurt. Cottage cheese. Beverages Coffee. Black tea. Red wine. Sweets and Desserts Cocoa. Chocolate. Ice cream. Other Basil. Oregano. Parsley. The items listed above may not be a complete list of foods and beverages to avoid. Contact your dietitian for more information. This information is not intended to replace advice given to you by your health care provider. Make sure you discuss any questions you have with your health care provider. Document Released: 07/05/2005 Document Revised: 06/10/2016 Document Reviewed: 06/18/2014 Elsevier Interactive Patient Education  [employer].

## 2018-08-23 NOTE — Assessment & Plan Note (Signed)
This is a very pleasant 67 year old white female presented for evaluation of thrombocytopenia.  The patient has no finding concerning for TTP.  Her thrombocytopenia is likely drug-induced from Wellbutrin or losartan versus immune mediated thrombocytopenia. Repeat CBC today showed improvement of her platelets count up to 130,000.  The patient is currently asymptomatic.  The patient was seen with Dr. Julien Nordmann.  Lab results reviewed with the patient and her husband.  Her platelet count has improved to 130,000.  Her prior labs did not show any concerning findings.  Her ferritin is low normal.  She had an ultrasound of the abdomen performed which showed mild splenomegaly and cirrhosis.  Suspect that her low platelets could be related to her liver disease.  We discussed this with the patient.  Recommend for her to begin taking ferrous sulfate 325 mg 1-2 times a day.  She should continue to follow-up with her primary care provider for monitoring of her liver enzymes and consideration of referral to gastroenterology if appropriate.  We will see the patient back on as-needed basis.  She was advised to call us if she develops any bleeding or increase in bruising.  The patient voices understanding of current disease status and treatment options and is in agreement with the current care plan.  All questions were answered. The patient knows to call the clinic with any problems, questions or concerns. We can certainly see the patient much sooner if necessary.

## 2018-08-23 NOTE — Progress Notes (Signed)
[facility] OFFICE PROGRESS NOTE  [name], MD [address] [place] [address]  DIAGNOSIS: Thrombocytopenia likely drug induced versus due to liver disease.  PRIOR THERAPY: None  CURRENT THERAPY: None  INTERVAL HISTORY: Dana Dyer 67 y.o. female returns for a routine follow-up visit accompanied by her husband.  The patient is feeling fine today and has no specific complaints except for fatigue.  Since her last visit, she reports that she decrease her Wellbutrin down to every other day.  She denies fevers and chills.  Denies chest pain, shortness of breath, cough, hemoptysis.  Denies nausea, vomiting, constipation, diarrhea.  Denies bleeding, bruising, ecchymosis.  The patient is here for evaluation and repeat lab work.  MEDICAL HISTORY: Past Medical History:  Diagnosis Date  . ALLERGIC RHINITIS 10/09/2007  . Arthritis    hips, spine  . DIABETES MELLITUS, UNCONTROLLED 03/16/2009  . FATIGUE 10/21/2008  . FIBROMYALGIA 10/09/2007  . GERD (gastroesophageal reflux disease)    diet  controlled, no meds  . H/O vaginal hysterectomy   . HYPERLIPIDEMIA 10/09/2007   diet controlled, no meds  . HYPERTENSION 10/09/2007  . Morton's neuroma   . OBSTRUCTIVE SLEEP APNEA 10/09/2007  . Plantar fasciitis    History - right  . Sleep apnea    Uses CPAP  . TRANSAMINASES, SERUM, ELEVATED 07/08/2009    ALLERGIES:  is allergic to amoxicillin-pot clavulanate; codeine sulfate; crestor [rosuvastatin calcium]; erythromycin base; invokana [canagliflozin]; irbesartan-hydrochlorothiazide; latex; and victoza [liraglutide].  MEDICATIONS:  Current Outpatient Medications  Medication Sig Dispense Refill  . amLODipine (NORVASC) 5 MG tablet TAKE 1 TABLET BY MOUTH EVERY DAY 90 tablet 2  . BD ULTRA-FINE PEN NEEDLES 29G X 12.7MM MISC USE AS DIRECTED 3 TIMES DAILY. 300 each 2  . buPROPion (WELLBUTRIN XL) 150 MG 24 hr tablet Take 1 tablet (150 mg total) daily by mouth. (Patient  taking differently: Take 150 mg by mouth every other day. ) 90 tablet 1  . escitalopram (LEXAPRO) 10 MG tablet TAKE ONE TABLET BY MOUTH ONE TIME DAILY 90 tablet 2  . HUMALOG KWIKPEN 100 UNIT/ML KiwkPen INJECT 5 UNITS WITH LUNCH AND 5 UNITS WITH SUPPER AS DIRECTED 3 pen 5  . Insulin Glargine (LANTUS SOLOSTAR) 100 UNIT/ML Solostar Pen INJECT 70 UNITS DAILY AS DIRECTED 45 pen 2  . losartan-hydrochlorothiazide (HYZAAR) 100-25 MG tablet TAKE 1 TABLET BY MOUTH ONCE DAILY 90 tablet 1  . metFORMIN (GLUCOPHAGE) 1000 MG tablet TAKE 1 TABLET BY MOUTH TWICE A DAY 180 tablet 2  . ONETOUCH DELICA LANCETS 02V MISC CHECK 3 TIMES DAILY. 300 each 1  . ONETOUCH VERIO test strip TEST 3 TIMES DAILY DX E11.9 300 each 1   Current Facility-Administered Medications  Medication Dose Route Frequency Provider Last Rate Last Dose  . TDaP (BOOSTRIX) injection 0.5 mL  0.5 mL Intramuscular Once Eulas Post, MD        SURGICAL HISTORY:  Past Surgical History:  Procedure Laterality Date  . ABDOMINAL HYSTERECTOMY  1979   prolapsed uterus  . BASAL CELL CARCINOMA EXCISION    . BREAST BIOPSY     left 2012 stereo  . BREAST REDUCTION SURGERY Bilateral 1992  . COLONOSCOPY  2006   [facility]  . mamoplasty     reduction  . REDUCTION MAMMAPLASTY Bilateral   . TONSILLECTOMY  1959  . TUBAL LIGATION  1976    REVIEW OF SYSTEMS:   Review of Systems  Constitutional: Negative for appetite change, chills, fever and unexpected weight  change.  Positive for fatigue. HENT:   Negative for mouth sores, nosebleeds, sore throat and trouble swallowing.   Eyes: Negative for eye problems and icterus.  Respiratory: Negative for cough, hemoptysis, shortness of breath and wheezing.   Cardiovascular: Negative for chest pain and leg swelling.  Gastrointestinal: Negative for abdominal pain, constipation, diarrhea, nausea and vomiting.  Genitourinary: Negative for bladder incontinence, difficulty urinating, dysuria, frequency and hematuria.    Musculoskeletal: Negative for back pain, gait problem, neck pain and neck stiffness.  Skin: Negative for itching and rash.  Neurological: Negative for dizziness, extremity weakness, gait problem, headaches, light-headedness and seizures.  Hematological: Negative for adenopathy. Does not bruise/bleed easily.  Psychiatric/Behavioral: Negative for confusion, depression and sleep disturbance. The patient is not nervous/anxious.     PHYSICAL EXAMINATION:  Blood pressure (!) 149/78, pulse (!) 103, temperature 98.1 F (36.7 C), temperature source Oral, resp. rate 16, height 5' 2.5" (1.588 m), weight 181 lb 12.8 oz (82.5 kg), SpO2 96 %.  ECOG PERFORMANCE STATUS: 1 - Symptomatic but completely ambulatory  Physical Exam  Constitutional: Oriented to person, place, and time and well-developed, well-nourished, and in no distress. No distress.  HENT:  Head: Normocephalic and atraumatic.  Mouth/Throat: Oropharynx is clear and moist. No oropharyngeal exudate.  Eyes: Conjunctivae are normal. Right eye exhibits no discharge. Left eye exhibits no discharge. No scleral icterus.  Neck: Normal range of motion. Neck supple.  Cardiovascular: Normal rate, regular rhythm, normal heart sounds and intact distal pulses.   Pulmonary/Chest: Effort normal and breath sounds normal. No respiratory distress. No wheezes. No rales.  Abdominal: Soft. Bowel sounds are normal. Exhibits no distension and no mass. There is no tenderness.  Musculoskeletal: Normal range of motion. Exhibits no edema.  Lymphadenopathy:    No cervical adenopathy.  Neurological: Alert and oriented to person, place, and time. Exhibits normal muscle tone. Gait normal. Coordination normal.  Skin: Skin is warm and dry. No rash noted. Not diaphoretic. No erythema. No pallor.  Psychiatric: Mood, memory and judgment normal.  Vitals reviewed.  LABORATORY DATA: Lab Results  Component Value Date   WBC 4.0 08/23/2018   HGB 11.9 08/23/2018   HCT 35.6  08/23/2018   MCV 83.6 08/23/2018   PLT 130 (L) 08/23/2018      Chemistry      Component Value Date/Time   NA 138 02/06/2018 0906   K 3.8 02/06/2018 0906   CL 98 02/06/2018 0906   CO2 32 02/06/2018 0906   BUN 13 02/06/2018 0906   CREATININE 0.60 02/06/2018 0906      Component Value Date/Time   CALCIUM 9.4 02/06/2018 0906   ALKPHOS 85 02/06/2018 0906   AST 36 02/06/2018 0906   ALT 37 (H) 02/06/2018 0906   BILITOT 1.0 02/06/2018 0906       RADIOGRAPHIC STUDIES:  US Abdomen Complete  Result Date: 08/02/2018 CLINICAL DATA:  Thrombocytopenia question splenomegaly; history hypertension, diabetes mellitus EXAM: ABDOMEN ULTRASOUND COMPLETE COMPARISON:  None FINDINGS: Gallbladder: Normally distended without stones or wall thickening. No pericholecystic fluid or sonographic Murphy sign. Common bile duct: Diameter: 5 mm diameter, normal Liver: Heterogeneous echogenicity. Hepatic contours appear slightly nodular suspicious for cirrhosis. No focal hepatic lesion. Portal vein is patent on color Doppler imaging with normal direction of blood flow towards the liver. IVC: Normal appearance Pancreas: Incomplete visualization of pancreatic head due to bowel gas. Remainder of pancreas normal appearance. Spleen: Normal echogenicity. 13.5 cm length with calculated volume of 442 mL, enlarged. Right Kidney: Length: 10.7 cm. Normal  morphology without mass or hydronephrosis. Left Kidney: Length: 11.2 cm. Normal cortical thickness and echogenicity. Small cyst inferior pole 14 x 12 x 13 mm. Abdominal aorta: Majority obscured by bowel gas. Visualized portions normal caliber. Other findings: No free fluid IMPRESSION: Mild splenomegaly. Suspected cirrhotic liver. Electronically Signed   By: Lavonia Dana M.D.   On: 08/02/2018 17:11     ASSESSMENT/PLAN:  Thrombocytopenia [facility]) This is a very pleasant 67 year old white female presented for evaluation of thrombocytopenia.  The patient has no finding concerning for  TTP.  Her thrombocytopenia is likely drug-induced from Wellbutrin or losartan versus immune mediated thrombocytopenia. Repeat CBC today showed improvement of her platelets count up to 130,000.  The patient is currently asymptomatic.  The patient was seen with Dr. Julien Nordmann.  Lab results reviewed with the patient and her husband.  Her platelet count has improved to 130,000.  Her prior labs did not show any concerning findings.  Her ferritin is low normal.  She had an ultrasound of the abdomen performed which showed mild splenomegaly and cirrhosis.  Suspect that her low platelets could be related to her liver disease.  We discussed this with the patient.  Recommend for her to begin taking ferrous sulfate 325 mg 1-2 times a day.  She should continue to follow-up with her primary care provider for monitoring of her liver enzymes and consideration of referral to gastroenterology if appropriate.  We will see the patient back on as-needed basis.  She was advised to call us if she develops any bleeding or increase in bruising.  The patient voices understanding of current disease status and treatment options and is in agreement with the current care plan.  All questions were answered. The patient knows to call the clinic with any problems, questions or concerns. We can certainly see the patient much sooner if necessary.   No orders of the defined types were placed in this encounter.    Mikey Bussing, DNP, AGPCNP-BC, AOCNP 08/23/18  ADDENDUM: Hematology/Oncology Attending: I had a face-to-face encounter with the patient today.  I recommended her care plan.  This is a very pleasant 67 years old white female presented for evaluation of thrombocytopenia.  This was felt to be drug-induced secondary to treatment with Wellbutrin versus secondary to liver cirrhosis and splenomegaly.  The patient is feeling fine and repeat platelets count showed significant improvement of her platelets count. She continues to  have mild anemia. I recommended for the patient to continue on observation for now with repeat follow-up visit by her primary care physician. For the suspicious iron deficiency anemia, she was advised to take over-the-counter oral iron tablets. We will see the patient on as-needed basis at this point. She was advised to call immediately if she has any concerning symptoms in the interval.  Disclaimer: This note was dictated with voice recognition software. Similar sounding words can inadvertently be transcribed and may be missed upon review. Eilleen Kempf, MD 08/26/18

## 2018-09-02 ENCOUNTER — Other Ambulatory Visit: Payer: Self-pay | Admitting: Family Medicine

## 2018-09-03 ENCOUNTER — Other Ambulatory Visit: Payer: Self-pay

## 2018-09-03 MED ORDER — INSULIN LISPRO 100 UNIT/ML (KWIKPEN): PEN_INJECTOR | SUBCUTANEOUS | 1 refills | Status: DC

## 2018-09-03 MED ORDER — AMLODIPINE BESYLATE 5 MG PO TABS: 5.0000 mg | ORAL_TABLET | Freq: Every day | ORAL | 2 refills | Status: DC

## 2018-09-09 ENCOUNTER — Other Ambulatory Visit: Payer: Self-pay | Admitting: Family Medicine

## 2018-09-10 NOTE — Telephone Encounter (Signed)
Refill for 6 months. 

## 2018-09-10 NOTE — Telephone Encounter (Signed)
Last OV 06/25/18, No future OV  Last filled 10/10/17, # 90 with 1 refill

## 2018-09-16 ENCOUNTER — Other Ambulatory Visit: Payer: Self-pay | Admitting: Family Medicine

## 2018-09-27 ENCOUNTER — Ambulatory Visit (INDEPENDENT_AMBULATORY_CARE_PROVIDER_SITE_OTHER): Payer: Medicare Other | Admitting: *Deleted

## 2018-09-27 DIAGNOSIS — Z23 Encounter for immunization: Secondary | ICD-10-CM | POA: Diagnosis not present

## 2018-10-12 ENCOUNTER — Encounter: Payer: Self-pay | Admitting: Family Medicine

## 2018-10-12 ENCOUNTER — Other Ambulatory Visit: Payer: Self-pay

## 2018-10-12 ENCOUNTER — Ambulatory Visit: Payer: Medicare Other | Admitting: Family Medicine

## 2018-10-12 VITALS — BP 116/68 | HR 89 | Temp 98.7°F | Ht 62.25 in | Wt 184.2 lb

## 2018-10-12 DIAGNOSIS — K7460 Unspecified cirrhosis of liver: Secondary | ICD-10-CM | POA: Diagnosis not present

## 2018-10-12 DIAGNOSIS — D696 Thrombocytopenia, unspecified: Secondary | ICD-10-CM

## 2018-10-12 DIAGNOSIS — E11649 Type 2 diabetes mellitus with hypoglycemia without coma: Secondary | ICD-10-CM

## 2018-10-12 LAB — POCT GLYCOSYLATED HEMOGLOBIN (HGB A1C): HEMOGLOBIN A1C: 10.2 % — AB (ref 4.0–5.6)

## 2018-10-12 LAB — POCT INR: INR: 1 — AB (ref 2.0–3.0)

## 2018-10-12 MED ORDER — LOSARTAN POTASSIUM 100 MG PO TABS: 100.0000 mg | ORAL_TABLET | Freq: Every day | ORAL | 3 refills | Status: DC

## 2018-10-12 MED ORDER — HYDROCHLOROTHIAZIDE 25 MG PO TABS: 25.0000 mg | ORAL_TABLET | Freq: Every day | ORAL | 3 refills | Status: DC

## 2018-10-12 NOTE — Progress Notes (Signed)
Subjective:     Patient ID: Dana Dyer, female   DOB: 1951/07/27, 67 y.o.   MRN: [id number]  HPI Patient has chronic problems including obesity, hypertension, obstructive sleep apnea, poorly controlled type 2 diabetes, fibromyalgia, hyperlipidemia, history of depression.  She had low platelets and was referred to hematology.  No evidence for TTP.  Concern was whether this was drug-induced from Wellbutrin or losartan versus immune mediated thrombocytopenia.  She stopped Wellbutrin and platelet count went from 64,000 to most recent count of 130,000.  She had ultrasound abdomen which showed mild splenomegaly and probable cirrhosis changes.  Recent albumin 3.7%.  In looking back over records.  She had ultrasound way back and 2000 which showed probable fatty infiltration of the liver-with comment that this was unchanged since 03/10/1993.  Very mildly elevated liver transaminases in the past.  Hematology obtained several labs including antinuclear antibody, HIV, acute hepatitis panel and these were unremarkable.  Recent hepatitis C antibody negative.  She does not use any alcohol regularly and no history of alcohol abuse.  Type 2 diabetes which has been poorly controlled.  Poor compliance with diet.  She remains on Lantus 70 units daily and Humalog 15 to 18 units with meals.  She sometimes skips the Humalog but has been taking her Lantus consistently.  Previous intolerance with GLP-1 medications and also had severe yeast infections with SGLT2 medication.  Past Medical History:  Diagnosis Date  . ALLERGIC RHINITIS 10/09/2007  . Arthritis    hips, spine  . DIABETES MELLITUS, UNCONTROLLED 03/16/2009  . FATIGUE 10/21/2008  . FIBROMYALGIA 10/09/2007  . GERD (gastroesophageal reflux disease)    diet  controlled, no meds  . H/O vaginal hysterectomy   . HYPERLIPIDEMIA 10/09/2007   diet controlled, no meds  . HYPERTENSION 10/09/2007  . Morton's neuroma   . OBSTRUCTIVE SLEEP APNEA 10/09/2007  . Plantar  fasciitis    History - right  . Sleep apnea    Uses CPAP  . TRANSAMINASES, SERUM, ELEVATED 07/08/2009   Past Surgical History:  Procedure Laterality Date  . ABDOMINAL HYSTERECTOMY  1979   prolapsed uterus  . BASAL CELL CARCINOMA EXCISION    . BREAST BIOPSY     left 2012 stereo  . BREAST REDUCTION SURGERY Bilateral 1992  . COLONOSCOPY  2006   [facility]  . mamoplasty     reduction  . REDUCTION MAMMAPLASTY Bilateral   . TONSILLECTOMY  1959  . TUBAL LIGATION  1976    reports that she has never smoked. She has never used smokeless tobacco. She reports that she does not drink alcohol or use drugs. family history includes Alcohol abuse in her father; Arthritis in her father and mother; Breast cancer in her maternal aunt; Breast cancer (age of onset: 108) in her mother; Cancer in her mother; Diabetes in her mother; Hypertension in her maternal grandfather; Miscarriages / [place] in her paternal grandfather. Allergies  Allergen Reactions  . Amoxicillin-Pot Clavulanate     Severe diarrhea  . Codeine Sulfate     REACTION: "hyper"  . Crestor [Rosuvastatin Calcium] Other (See Comments)    myalgia  . Erythromycin Base   . Invokana [Canagliflozin] Other (See Comments)    Recurrent yeast infections  . Irbesartan-Hydrochlorothiazide   . Latex   . Victoza [Liraglutide] Nausea Only     Review of Systems  Constitutional: Negative for fatigue and unexpected weight change.  Eyes: Negative for visual disturbance.  Respiratory: Negative for cough, chest tightness, shortness of breath and wheezing.  Cardiovascular: Negative for chest pain, palpitations and leg swelling.  Gastrointestinal: Negative for abdominal pain.  Endocrine: Negative for polydipsia and polyuria.  Genitourinary: Negative for dysuria.  Neurological: Negative for dizziness, seizures, syncope, weakness, light-headedness and headaches.  Hematological: Does not bruise/bleed easily.       Objective:   Physical Exam   Constitutional: She appears well-developed and well-nourished.  Cardiovascular: Normal rate and regular rhythm.  Pulmonary/Chest: Effort normal and breath sounds normal.  Abdominal: Soft. She exhibits no mass. There is no tenderness.  Musculoskeletal: She exhibits no edema.       Assessment:     #1 thrombocytopenia.  Question is whether this was drug-induced versus related to nonalcoholic cirrhosis changes.  This did apparently come up a fair amount after discontinuing Wellbutrin  #2 concern for possible cirrhosis changes on recent ultrasound.  Suspect probably on the basis of long-standing fatty liver issues.  Recent hepatitis studies negative.  #3 poorly controlled type 2 diabetes.  A1c today back up to 10.2%.  Poor compliance with diet  #4 hyperlipidemia  #5 hypertension stable and at goal    Plan:     -Check INR to further assess liver function.  Recent albumin was normal. -Consider referral to gastroenterology regarding her cirrhotic changes on ultrasound as above -Recommend referral to endocrinology to get their assessment regarding her diabetes and suggestions for change.  She has been unfortunately intolerant of SGLT2 class and GLP-1 class -discussed importance of weight loss- especially with regard to fatty liver- but also her diabetes.  Unfortunately, she has had limited success with this in past.  Eulas Post MD [facility]

## 2018-10-23 ENCOUNTER — Encounter: Payer: Self-pay | Admitting: Internal Medicine

## 2018-10-23 ENCOUNTER — Ambulatory Visit: Payer: Medicare Other | Admitting: Internal Medicine

## 2018-10-23 VITALS — BP 118/60 | HR 92 | Ht 62.5 in | Wt 185.4 lb

## 2018-10-23 DIAGNOSIS — Z794 Long term (current) use of insulin: Secondary | ICD-10-CM

## 2018-10-23 DIAGNOSIS — E1165 Type 2 diabetes mellitus with hyperglycemia: Secondary | ICD-10-CM | POA: Diagnosis not present

## 2018-10-23 NOTE — Progress Notes (Signed)
Name: Dana Dyer  MRN/ DOB: [id number], [birth year]   Age/ Sex: 67 y.o., female    PCP: Eulas Post, MD   Reason for Endocrinology Evaluation: Type 2 Diabetes Mellitus     Date of Initial Endocrinology Visit: 10/23/2018     PATIENT IDENTIFIER: Dana Dyer is a 67 y.o. female with a past medical history of HTN, T2DM, Cirrhosis, Fibromyalgia and dyslipidemia. The patient presented for initial endocrinology clinic visit on 10/23/2018 for consultative assistance with her diabetes management.    HPI: Dana Dyer was    Diagnosed with T2DM  Since 2005 Prior Medications tried/Intolerance: SU - can not recall the side effects . She is intolerant to victoza and invokana. She has been on insulin for ~ 10 yrs.  Currently checking blood sugars 1 x / day,  before breakfast.   Hypoglycemia episodes : No                 Hemoglobin A1c has ranged from 7.8% in 2012, peaking at 10.9%  In 2018. Patient required assistance for hypoglycemia: No Patient has required hospitalization within the last 1 year from hyper or hypoglycemia: No   In terms of diet, the patient eats typically 2 meals a day, snacks 3 times a day .    HOME DIABETES REGIMEN: Lantus 70 units daily  Humalog 10 units with breakfast , 18 units with lunch and supper  Metformin 1000 mg bid  Statin: No- intolerant ACE-I/ARB: Yes    METER DOWNLOAD SUMMARY: Did not bring meter      DIABETIC COMPLICATIONS: Microvascular complications:   Neuroathy  Denies: retinopathy, nephropathy  Last eye exam: Completed 2019   Macrovascular complications:    Denies: CAD, PVD, CVA   PAST HISTORY: Past Medical History:  Past Medical History:  Diagnosis Date  . ALLERGIC RHINITIS 10/09/2007  . Arthritis    hips, spine  . DIABETES MELLITUS, UNCONTROLLED 03/16/2009  . FATIGUE 10/21/2008  . FIBROMYALGIA 10/09/2007  . GERD (gastroesophageal reflux disease)    diet  controlled, no meds  . H/O vaginal hysterectomy    . HYPERLIPIDEMIA 10/09/2007   diet controlled, no meds  . HYPERTENSION 10/09/2007  . Morton's neuroma   . OBSTRUCTIVE SLEEP APNEA 10/09/2007  . Plantar fasciitis    History - right  . Sleep apnea    Uses CPAP  . TRANSAMINASES, SERUM, ELEVATED 07/08/2009   Past Surgical History:  Past Surgical History:  Procedure Laterality Date  . ABDOMINAL HYSTERECTOMY  1979   prolapsed uterus  . BASAL CELL CARCINOMA EXCISION    . BREAST BIOPSY     left 2012 stereo  . BREAST REDUCTION SURGERY Bilateral 1992  . COLONOSCOPY  2006   [facility]  . mamoplasty     reduction  . REDUCTION MAMMAPLASTY Bilateral   . TONSILLECTOMY  1959  . TUBAL LIGATION  1976      Social History:  reports that she has never smoked. She has never used smokeless tobacco. She reports that she does not drink alcohol or use drugs. Family History:  Family History  Problem Relation Age of Onset  . Diabetes Mother   . Arthritis Mother   . Cancer Mother        breast  . Breast cancer Mother 60  . Alcohol abuse Father   . Arthritis Father   . Hypertension Maternal Grandfather   . Miscarriages / Stillbirths Paternal Grandfather   . Breast cancer Maternal Aunt  diagnosed in her 62's  . Colon polyps Neg Hx   . Colon cancer Neg Hx   . Esophageal cancer Neg Hx   . Rectal cancer Neg Hx   . Stomach cancer Neg Hx      HOME MEDICATIONS: Allergies as of 10/23/2018      Reactions   Amoxicillin-pot Clavulanate    Severe diarrhea   Codeine Sulfate    REACTION: "hyper"   Crestor [rosuvastatin Calcium] Other (See Comments)   myalgia   Erythromycin Base    Invokana [canagliflozin] Other (See Comments)   Recurrent yeast infections   Irbesartan-hydrochlorothiazide    Latex    Victoza [liraglutide] Nausea Only      Medication List        Accurate as of 10/23/18  1:42 PM. Always use your most recent med list.          amLODipine 5 MG tablet Commonly known as:  NORVASC Take 1 tablet (5 mg total) by mouth  daily.   b complex vitamins capsule Take 1 capsule by mouth daily.   BD ULTRA-FINE PEN NEEDLES 29G X 12.7MM Misc Generic drug:  Insulin Pen Needle USE AS DIRECTED 3 TIMES DAILY.   escitalopram 10 MG tablet Commonly known as:  LEXAPRO TAKE ONE TABLET BY MOUTH ONE TIME DAILY   hydrochlorothiazide 25 MG tablet Commonly known as:  HYDRODIURIL Take 1 tablet (25 mg total) by mouth daily.   Insulin Glargine 100 UNIT/ML Solostar Pen Commonly known as:  LANTUS INJECT 70 UNITS DAILY AS DIRECTED   insulin lispro 100 UNIT/ML KiwkPen Commonly known as:  HUMALOG INJECT 15 UNITS TWICE DAILY AS DIRECTED   losartan 100 MG tablet Commonly known as:  COZAAR Take 1 tablet (100 mg total) by mouth daily.   metFORMIN 1000 MG tablet Commonly known as:  GLUCOPHAGE TAKE 1 TABLET BY MOUTH TWICE A DAY   MISC NATURAL PRODUCTS PO Take by mouth.   ONETOUCH DELICA LANCETS 16L Misc CHECK 3 TIMES DAILY.   ONETOUCH VERIO test strip Generic drug:  glucose blood TEST 3 TIMES DAILY DX E11.9        ALLERGIES: Allergies  Allergen Reactions  . Amoxicillin-Pot Clavulanate     Severe diarrhea  . Codeine Sulfate     REACTION: "hyper"  . Crestor [Rosuvastatin Calcium] Other (See Comments)    myalgia  . Erythromycin Base   . Invokana [Canagliflozin] Other (See Comments)    Recurrent yeast infections  . Irbesartan-Hydrochlorothiazide   . Latex   . Victoza [Liraglutide] Nausea Only     REVIEW OF SYSTEMS: A comprehensive ROS was conducted with the patient and is negative except as per HPI and below:  Review of Systems  Constitutional: Negative for malaise/fatigue and weight loss.  HENT: Negative for congestion and sore throat.   Eyes: Negative for blurred vision and pain.  Respiratory: Negative for cough and shortness of breath.   Cardiovascular: Negative for chest pain and palpitations.  Gastrointestinal: Positive for nausea. Negative for diarrhea.  Genitourinary: Negative for frequency.    Musculoskeletal: Negative for joint pain and neck pain.  Skin: Negative for rash.  Neurological: Positive for tingling. Negative for tremors.  Endo/Heme/Allergies: Positive for polydipsia.  Psychiatric/Behavioral: Negative for depression. The patient is not nervous/anxious.       OBJECTIVE:   VITAL SIGNS: BP 118/60 (BP Location: Left Arm, Patient Position: Sitting, Cuff Size: Normal)   Pulse 92   Ht 5' 2.5" (1.588 m)   Wt 185 lb 6 oz (84.1 kg)  SpO2 97%   BMI 33.37 kg/m    PHYSICAL EXAM:  General: Pt appears well and is in NAD  Hydration: Well-hydrated with moist mucous membranes and good skin turgor  HEENT: Head: Unremarkable with good dentition. Oropharynx clear without exudate.  Eyes: External eye exam normal without stare, lid lag or exophthalmos.  EOM intact.  PERRL.  Neck: General: Supple without adenopathy or carotid bruits. Thyroid: Thyroid size normal.  No goiter or nodules appreciated. No thyroid bruit.  Lungs: Clear with good BS bilat with no rales, rhonchi, or wheezes  Heart: RRR with normal S1 and S2, + systolic murmur; no rub  Abdomen: Normoactive bowel sounds, soft, nontender, without masses or organomegaly palpable  Extremities:  Lower extremities - Trace pretibial edema. No lesions.  Skin: Normal texture and temperature to palpation. No rash noted.   Neuro: MS is good with appropriate affect, pt is alert and Ox3    DM foot exam: DM foot exam: The skin of the feet is intact without sores or ulcerations. Plantar callous noted B/L The pedal pulses are 2+ on right and 2+ on left. The sensation is intact to a screening 5.07, 10 gram monofilament bilaterally    DATA REVIEWED:  Lab Results  Component Value Date   HGBA1C 10.2 (A) 10/12/2018   HGBA1C 8.8 (H) 06/25/2018   HGBA1C 8.8 02/06/2018   Lab Results  Component Value Date   MICROALBUR <0.7 09/21/2016   LDLCALC 104 (H) 02/06/2018   CREATININE 0.60 02/06/2018   Lab Results  Component Value Date    MICRALBCREAT 0.8 09/21/2016    Lab Results  Component Value Date   CHOL 188 02/06/2018   HDL 66.30 02/06/2018   LDLCALC 104 (H) 02/06/2018   LDLDIRECT 124.9 10/14/2010   TRIG 89.0 02/06/2018   CHOLHDL 3 02/06/2018        ASSESSMENT / PLAN / RECOMMENDATIONS:   1) Type 2 Diabetes Mellitus, poorly controlled, With microvascular complications - Most recent A1c of 10.2 %. Goal A1c < 7.0 %.    Plan: GENERAL: I have discussed with the patient the pathophysiology of diabetes. We went over the natural progression of the disease. We talked about both insulin resistance and insulin deficiency. We stressed the importance of lifestyle changes including diet and exercise. I explained the complications associated with diabetes including retinopathy, nephropathy, neuropathy as well as increased risk of cardiovascular disease. We went over the benefit seen with glycemic control.  Discussed pharmacokinetics of basal/bolus insulin and its relation to eating and the effects of post-prandial hyperglycemia on A1c.  I am unable to make any changes to her medication regimen, as I do not have any glucose data.  We discussed medicare criteria for [facility] , pt advised to check glucose 4x a day to qualify.  Her insulin requirements are too high at this time, she assured me she takes insulin as prescribed except for once a week , she may miss a prandial dose, which means her diet is very poor.  I have advised her to take Humalog prior to her meals and not after   MEDICATIONS:  Continue Lantus 70 units daily   Continue Humalog 10 units with Breakfast, 18 units with Lunch and Supper  EDUCATION / INSTRUCTIONS:  BG monitoring instructions: Patient is instructed to check her blood sugars 4 times a day, before meals and bedtime.  Call [place] [facility] if: BG persistently < 70 or > 300. . I reviewed the Rule of 15 for the treatment of hypoglycemia  in detail with the patient. Literature  supplied.   2) Diabetic complications:   Eye: Does not have known diabetic retinopathy. Last eye exam was   Neuro/ Feet: Does  have known diabetic peripheral neuropathy.  Renal: Patient does not have known baseline CKD. She is on an ACEI/ARB at present.   3) Lipids: Patient is intolerant to statin.    4) Hypertension: She is  at goal of < 140/90 mmHg.    F/u in 2 weeks with glucose log   Signed electronically by: Mack Guise, MD  [facility]  [facility] [address] [place],  [address] Phone: [phone] FAX: [phone]   CC: Eulas Post, [place] [address] Phone: [phone]  Fax: [phone]    Return to Endocrinology clinic as below: Future Appointments  Date Time Provider [place]  11/23/2018  9:45 AM Milus Banister, MD [facility]

## 2018-10-23 NOTE — Patient Instructions (Addendum)
-   Check sugars before meals and at bedtime - Continue Lantus 70 units daily - Continue Humalog 10 units with Breakfast, 18 units with lunch and supper - Continue Metformin 1000 mg twice a day - HOW TO TREAT LOW BLOOD SUGARS (Blood sugar LESS THAN 70 MG/DL)  Please follow the RULE OF 15 for the treatment of hypoglycemia treatment (when your (blood sugars are less than 70 mg/dL)    STEP 1: Take 15 grams of carbohydrates when your blood sugar is low, which includes:   3-4 GLUCOSE TABS  OR  3-4 OZ OF JUICE OR REGULAR SODA OR  ONE TUBE OF GLUCOSE GEL     STEP 2: RECHECK blood sugar in 15 MINUTES STEP 3: If your blood sugar is still low at the 15 minute recheck --> then, go back to STEP 1 and treat AGAIN with another 15 grams of carbohydrates.

## 2018-10-31 NOTE — Progress Notes (Deleted)
Name: Dana Dyer  Age/ Sex: 67 y.o., female   MRN/ DOB: [id number], Sep 15, 1951     PCP: Eulas Post, MD   Reason for Endocrinology Evaluation: Type 2 Diabetes Mellitus  Initial Endocrine Consultative Visit: 10/23/2018    PATIENT IDENTIFIER: Dana Dyer is a 67 y.o. female with a past medical history of HTN, T2DM, Cirrhosis, Fibromyalgia and dyslipidemia . The patient has followed with Endocrinology clinic since 10/23/18 for consultative assistance with management of her diabetes.  DIABETIC HISTORY:  Ms. Deland was diagnosed with T2DM in 2005, she is intolerant to victoza and invokana, she has also been on SU at some point, but does not recall the cause of discontinuation, and started insulin therapy approximately 5 years after diagnosis. Her hemoglobin A1c has ranged from 7.8% in 2012, peaking at 10.9%  In 2018.    SUBJECTIVE:   During the last visit (10/23/18): Her A1c was 10.2%. She did not bring glucose meter. We continued insulin regimen and dicussed lifestyle changes.   Today (10/31/2018): [name]  She checks her blood sugars *** times daily, preprandial to breakfast and ***. The patient has *** had hypoglycemic episodes since the last clinic visit, which typically occur *** x / - most often occuring ***. The patient is *** symptomatic with these episodes, with symptoms of {symptoms; hypoglycemia:9084048}. Otherwise, the patient {HAS/HAS NOT:522402} required any recent emergency interventions for hypoglycemia and {HAS/HAS NOT:522402} had recent hospitalizations secondary to hyper or hypoglycemic episodes.    ROS: As per HPI and as detailed below: ROS    HOME DIABETES REGIMEN:  Lantus 70 units daily Humalog 10 with Breakfast, 18 units with lunch and supper    METER DOWNLOAD SUMMARY: Date range evaluated: *** Fingerstick Blood Glucose Tests = *** Average Number Tests/Day  = *** Overall Mean FS Glucose = *** Standard Deviation = ***  BG Ranges: Low = *** High = ***   Hypoglycemic Events/30 Days: BG < 50 = *** Episodes of symptomatic severe hypoglycemia = ***    HISTORY:  Past Medical History:  Past Medical History:  Diagnosis Date  . ALLERGIC RHINITIS 10/09/2007  . Arthritis    hips, spine  . DIABETES MELLITUS, UNCONTROLLED 03/16/2009  . FATIGUE 10/21/2008  . FIBROMYALGIA 10/09/2007  . GERD (gastroesophageal reflux disease)    diet  controlled, no meds  . H/O vaginal hysterectomy   . HYPERLIPIDEMIA 10/09/2007   diet controlled, no meds  . HYPERTENSION 10/09/2007  . Morton's neuroma   . OBSTRUCTIVE SLEEP APNEA 10/09/2007  . Plantar fasciitis    History - right  . Sleep apnea    Uses CPAP  . TRANSAMINASES, SERUM, ELEVATED 07/08/2009    Past Surgical History:  Past Surgical History:  Procedure Laterality Date  . ABDOMINAL HYSTERECTOMY  1979   prolapsed uterus  . BASAL CELL CARCINOMA EXCISION    . BREAST BIOPSY     left 2012 stereo  . BREAST REDUCTION SURGERY Bilateral 1992  . COLONOSCOPY  2006   [facility]  . mamoplasty     reduction  . REDUCTION MAMMAPLASTY Bilateral   . TONSILLECTOMY  1959  . TUBAL LIGATION  1976     Social History:  reports that she has never smoked. She has never used smokeless tobacco. She reports that she does not drink alcohol or use drugs. Family History:  Family History  Problem Relation Age of Onset  . Diabetes Mother   . Arthritis Mother   . Cancer Mother  breast  . Breast cancer Mother 69  . Alcohol abuse Father   . Arthritis Father   . Hypertension Maternal Grandfather   . Miscarriages / Stillbirths Paternal Grandfather   . Breast cancer Maternal Aunt        diagnosed in her 6's  . Colon polyps Neg Hx   . Colon cancer Neg Hx   . Esophageal cancer Neg Hx   . Rectal cancer Neg Hx   . Stomach cancer Neg Hx       HOME MEDICATIONS: Allergies as of 11/06/2018      Reactions    Amoxicillin-pot Clavulanate    Severe diarrhea   Codeine Sulfate    REACTION: "hyper"   Crestor [rosuvastatin Calcium] Other (See Comments)   myalgia   Erythromycin Base    Invokana [canagliflozin] Other (See Comments)   Recurrent yeast infections   Irbesartan-hydrochlorothiazide    Latex    Victoza [liraglutide] Nausea Only      Medication List        Accurate as of 10/31/18  7:57 AM. Always use your most recent med list.          amLODipine 5 MG tablet Commonly known as:  NORVASC Take 1 tablet (5 mg total) by mouth daily.   b complex vitamins capsule Take 1 capsule by mouth daily.   BD ULTRA-FINE PEN NEEDLES 29G X 12.7MM Misc Generic drug:  Insulin Pen Needle USE AS DIRECTED 3 TIMES DAILY.   escitalopram 10 MG tablet Commonly known as:  LEXAPRO TAKE ONE TABLET BY MOUTH ONE TIME DAILY   hydrochlorothiazide 25 MG tablet Commonly known as:  HYDRODIURIL Take 1 tablet (25 mg total) by mouth daily.   Insulin Glargine 100 UNIT/ML Solostar Pen Commonly known as:  LANTUS INJECT 70 UNITS DAILY AS DIRECTED   insulin lispro 100 UNIT/ML KiwkPen Commonly known as:  HUMALOG INJECT 15 UNITS TWICE DAILY AS DIRECTED   losartan 100 MG tablet Commonly known as:  COZAAR Take 1 tablet (100 mg total) by mouth daily.   metFORMIN 1000 MG tablet Commonly known as:  GLUCOPHAGE TAKE 1 TABLET BY MOUTH TWICE A DAY   MISC NATURAL PRODUCTS PO Take by mouth.   ONETOUCH DELICA LANCETS 55V Misc CHECK 3 TIMES DAILY.   ONETOUCH VERIO test strip Generic drug:  glucose blood TEST 3 TIMES DAILY DX E11.9        OBJECTIVE:   Vital Signs: There were no vitals taken for this visit.  Wt Readings from Last 3 Encounters:  10/23/18 185 lb 6 oz (84.1 kg)  10/12/18 184 lb 3.2 oz (83.6 kg)  08/23/18 181 lb 12.8 oz (82.5 kg)     Exam: General: Pt appears well and is in NAD  Hydration: Well-hydrated with moist mucous membranes and good skin turgor  HEENT: Head: Unremarkable with good  dentition. Oropharynx clear without exudate.  Eyes: External eye exam normal without stare, lid lag or exophthalmos.  EOM intact.  PERRL.  Neck: General: Supple without adenopathy. Thyroid: Thyroid size normal.  No goiter or nodules appreciated. No thyroid bruit.  Lungs: Clear with good BS bilat with no rales, rhonchi, or wheezes  Heart: RRR with normal S1 and S2 and no gallops; no murmurs; no rub  Abdomen: Normoactive bowel sounds, soft, nontender, without masses or organomegaly palpable  Extremities: No pretibial edema. No tremor. Normal strength and motion throughout. See detailed diabetic foot exam below.  Skin: Normal texture and temperature to palpation. No rash noted. No Acanthosis nigricans/skin tags.  No lipohypertrophy.  Neuro: MS is good with appropriate affect, pt is alert and Ox3    DM foot exam: 10/23/18   The skin of the feet is intact without sores or ulcerations. Plantar callous noted B/L The pedal pulses are 2+ on right and 2+ on left. The sensation is intact to a screening 5.07, 10 gram monofilament bilaterally       DATA REVIEWED:  Lab Results  Component Value Date   HGBA1C 10.2 (A) 10/12/2018   HGBA1C 8.8 (H) 06/25/2018   HGBA1C 8.8 02/06/2018   Lab Results  Component Value Date   MICROALBUR <0.7 09/21/2016   LDLCALC 104 (H) 02/06/2018   CREATININE 0.60 02/06/2018   Lab Results  Component Value Date   MICRALBCREAT 0.8 09/21/2016          ASSESSMENT / PLAN / RECOMMENDATIONS:   1) Type 2 Diabetes Mellitus, poorly controlled, With neuropathic complications - Most recent A1c of 10.2 %. Goal A1c < 7.0 %.   Plan: MEDICATIONS:  ***  EDUCATION / INSTRUCTIONS:  BG monitoring instructions: Patient is instructed to check her blood sugars *** times a day, ***.  Call [place] [facility] clinic if: BG persistently < 70 or > 300. . I reviewed the Rule of 15 for the treatment of hypoglycemia in detail with the patient. Literature supplied.     F/U in  ***    Signed electronically by: Mack Guise, MD  [facility]  [facility] [address] [place], [place] [address] Phone: [phone] FAX: [phone]   CC: [address], [place] [address] Phone: [phone]  Fax: [phone]  Return to Endocrinology clinic as below: Future Appointments  Date Time Provider [place]  11/06/2018  9:10 AM , Melanie Crazier, MD [facility] None  11/23/2018  9:45 AM Milus Banister, MD [facility]

## 2018-11-06 ENCOUNTER — Ambulatory Visit: Payer: Medicare Other | Admitting: Internal Medicine

## 2018-11-13 ENCOUNTER — Ambulatory Visit: Payer: Medicare Other | Admitting: Internal Medicine

## 2018-11-15 ENCOUNTER — Ambulatory Visit: Payer: Medicare Other | Admitting: Internal Medicine

## 2018-11-15 ENCOUNTER — Encounter: Payer: Self-pay | Admitting: Internal Medicine

## 2018-11-15 VITALS — BP 132/62 | HR 96 | Ht 62.0 in | Wt 187.6 lb

## 2018-11-15 DIAGNOSIS — Z794 Long term (current) use of insulin: Secondary | ICD-10-CM | POA: Diagnosis not present

## 2018-11-15 DIAGNOSIS — E1165 Type 2 diabetes mellitus with hyperglycemia: Secondary | ICD-10-CM | POA: Diagnosis not present

## 2018-11-15 DIAGNOSIS — E1142 Type 2 diabetes mellitus with diabetic polyneuropathy: Secondary | ICD-10-CM

## 2018-11-15 MED ORDER — INSULIN GLARGINE 100 UNIT/ML SOLOSTAR PEN: 60.0000 [IU] | PEN_INJECTOR | Freq: Every day | SUBCUTANEOUS | 2 refills | Status: DC

## 2018-11-15 MED ORDER — SEMAGLUTIDE(0.25 OR 0.5MG/DOS) 2 MG/1.5ML ~~LOC~~ SOPN: 0.2500 mg | PEN_INJECTOR | SUBCUTANEOUS | 11 refills | Status: DC

## 2018-11-15 NOTE — Progress Notes (Signed)
Name: Dana Dyer  Age/ Sex: 67 y.o., female   MRN/ DOB: [id number], Feb 05, 1951     PCP: Eulas Post, MD   Reason for Endocrinology Evaluation: Type 2 Diabetes Mellitus  Initial Endocrine Consultative Visit: 10/23/18    PATIENT IDENTIFIER: [name] is a 67 y.o. female with a past medical history of HTN, T2DM, Cirrhosis, Fibromyalgia and dyslipidemia . The patient has followed with Endocrinology clinic since 10/23/18 for consultative assistance with management of her diabetes.  DIABETIC HISTORY:  Ms. Julio was diagnosed with T2DM in 2005. She used to be on SU but does not recall intolerance. She is intolerant to victoza and invokana. She was started on insulin therapy in 2009. Her hemoglobin A1c has ranged from 7.8 % in 2012, peaking at 10.9% in 2018.   SUBJECTIVE:   During the last visit (10/23/18): Her A1c was 10.2%. She did not bring any glucose data and we were unable to make any changes to her insulin regimen. She was continued on Lantus 70 units daily, Humalog 10 units with breakfast and 18 with lunch and supper   Today (11/15/2018): [name]  She checks her blood sugars 2 times daily. The patient has not had hypoglycemic episodes since the last clinic visit, but she did have tight BG's in the mornings.  Otherwise, the patient has not required any recent emergency interventions for hypoglycemia and has not had recent hospitalizations secondary to hyper or hypoglycemic episodes.    ROS: As per HPI and as detailed below: Review of Systems  HENT: Negative for congestion and sore throat.   Eyes: Negative for blurred vision and pain.  Respiratory: Negative for cough and shortness of breath.   Cardiovascular: Negative for chest pain and palpitations.  Gastrointestinal: Negative for abdominal pain, diarrhea and nausea.      HOME DIABETES REGIMEN:  Lantus 70 units  daily Humalog 10 units with Breakfast  Humalog 18 units with lunch and supper  Metformin 1000 mg BID   METER DOWNLOAD SUMMARY: Date range evaluated: 11/13-12/12/19 Fingerstick Blood Glucose Tests = 39 Overall Mean FS Glucose = 180.3 Standard Deviation = 77.4  BG Ranges: Low = 77 High = 424   Hypoglycemic Events/30 Days: BG < 50 = 0 Episodes of symptomatic severe hypoglycemia = 0    HISTORY:  Past Medical History:  Past Medical History:  Diagnosis Date  . ALLERGIC RHINITIS 10/09/2007  . Arthritis    hips, spine  . DIABETES MELLITUS, UNCONTROLLED 03/16/2009  . FATIGUE 10/21/2008  . FIBROMYALGIA 10/09/2007  . GERD (gastroesophageal reflux disease)    diet  controlled, no meds  . H/O vaginal hysterectomy   . HYPERLIPIDEMIA 10/09/2007   diet controlled, no meds  . HYPERTENSION 10/09/2007  . Morton's neuroma   . OBSTRUCTIVE SLEEP APNEA 10/09/2007  . Plantar fasciitis    History - right  . Sleep apnea    Uses CPAP  . TRANSAMINASES, SERUM, ELEVATED 07/08/2009   Past Surgical History:  Past Surgical History:  Procedure Laterality Date  . ABDOMINAL HYSTERECTOMY  1979   prolapsed uterus  . BASAL CELL CARCINOMA EXCISION    . BREAST BIOPSY     left 2012 stereo  . BREAST REDUCTION SURGERY Bilateral 1992  . COLONOSCOPY  2006   [facility]  . mamoplasty     reduction  . REDUCTION MAMMAPLASTY Bilateral   . TONSILLECTOMY  1959  . TUBAL LIGATION  1976    Social History:  reports that she has never smoked. She  has never used smokeless tobacco. She reports that she does not drink alcohol or use drugs. Family History:  Family History  Problem Relation Age of Onset  . Diabetes Mother   . Arthritis Mother   . Cancer Mother        breast  . Breast cancer Mother 55  . Alcohol abuse Father   . Arthritis Father   . Hypertension Maternal Grandfather   . Miscarriages / Stillbirths Paternal Grandfather   . Breast cancer Maternal Aunt        diagnosed in her 34's  . Colon polyps Neg  Hx   . Colon cancer Neg Hx   . Esophageal cancer Neg Hx   . Rectal cancer Neg Hx   . Stomach cancer Neg Hx      HOME MEDICATIONS: Allergies as of 11/15/2018      Reactions   Amoxicillin-pot Clavulanate    Severe diarrhea   Codeine Sulfate    REACTION: "hyper"   Crestor [rosuvastatin Calcium] Other (See Comments)   myalgia   Erythromycin Base    Invokana [canagliflozin] Other (See Comments)   Recurrent yeast infections   Irbesartan-hydrochlorothiazide    Latex    Victoza [liraglutide] Nausea Only      Medication List       Accurate as of November 15, 2018 12:57 PM. Always use your most recent med list.        amLODipine 5 MG tablet Commonly known as:  NORVASC Take 1 tablet (5 mg total) by mouth daily.   b complex vitamins capsule Take 1 capsule by mouth daily.   BD ULTRA-FINE PEN NEEDLES 29G X 12.7MM Misc Generic drug:  Insulin Pen Needle USE AS DIRECTED 3 TIMES DAILY.   escitalopram 10 MG tablet Commonly known as:  LEXAPRO TAKE ONE TABLET BY MOUTH ONE TIME DAILY   hydrochlorothiazide 25 MG tablet Commonly known as:  HYDRODIURIL Take 1 tablet (25 mg total) by mouth daily.   Insulin Glargine 100 UNIT/ML Solostar Pen Commonly known as:  LANTUS SOLOSTAR INJECT 70 UNITS DAILY AS DIRECTED   insulin lispro 100 UNIT/ML KiwkPen Commonly known as:  HUMALOG KWIKPEN INJECT 15 UNITS TWICE DAILY AS DIRECTED   losartan 100 MG tablet Commonly known as:  COZAAR Take 1 tablet (100 mg total) by mouth daily.   metFORMIN 1000 MG tablet Commonly known as:  GLUCOPHAGE TAKE 1 TABLET BY MOUTH TWICE A DAY   MISC NATURAL PRODUCTS PO Take by mouth.   ONETOUCH DELICA LANCETS 15Q Misc CHECK 3 TIMES DAILY.   ONETOUCH VERIO test strip Generic drug:  glucose blood TEST 3 TIMES DAILY DX E11.9        OBJECTIVE:   Vital Signs: BP 132/62   Pulse 96   Ht 5' 2"  (1.575 m)   Wt 187 lb 9.6 oz (85.1 kg)   SpO2 96%   BMI 34.31 kg/m   Wt Readings from Last 3 Encounters:   11/15/18 187 lb 9.6 oz (85.1 kg)  10/23/18 185 lb 6 oz (84.1 kg)  10/12/18 184 lb 3.2 oz (83.6 kg)     Exam: General: Pt appears well and is in NAD  Lungs: Clear with good BS bilat with no rales, rhonchi, or wheezes  Heart: RRR with normal S1 and S2 and no gallops; no murmurs; no rub  Abdomen: Normoactive bowel sounds, soft, nontender, without masses or organomegaly palpable  Extremities: No pretibial edema. No tremor. Normal strength and motion throughout. See detailed diabetic foot exam below.  Neuro:  MS is good with appropriate affect, pt is alert and Ox3    DATA REVIEWED:  Lab Results  Component Value Date   HGBA1C 10.2 (A) 10/12/2018   HGBA1C 8.8 (H) 06/25/2018   HGBA1C 8.8 02/06/2018   Lab Results  Component Value Date   MICROALBUR <0.7 09/21/2016   LDLCALC 104 (H) 02/06/2018   CREATININE 0.60 02/06/2018     ASSESSMENT / PLAN / RECOMMENDATIONS:   1) Type 2 Diabetes Mellitus, poorly controlled, With neuropathic complications - Most recent A1c of 10.2 %. Goal A1c < 7.0 %.   Plan:  - Praised patient on glucose checks and lifestyle changes.  - She continues to improve her glucose readings, she has stopped snacking and avoids sugar-sweetened beverages.  - We discussed trying another GLP-1 agonist, despite reported intolerance to Victoza in the past. She is agreeable to start Ozempic.  - Discussed  GI side effects, pt encouraged to call us with any issues.  - Based on fasting tight BG's , will reduce Lantus dose.  - I wil also give her a correction scale, to avoid her from guessing on a humalog dose with severe hyperglycemia  At times. As she has been taking 15 units with BG's > 400 mg/dL.  - Discussed risk of hypoglycemia with insulin   MEDICATIONS: Decrease Lantus to 60 units daily - Continue Humalog 10 units with breakfast, 18 units with Lunch and Supper  - Ozempic 0.25 mg weekly   Humalog correctional insulin: ADD extra units on insulin to your meal-time  dose  if your blood sugars are higher than 175. Use the scale below to help guide you:   Blood sugar before meal Number of units to inject  Less than 175 0 unit  176 -  200 1 units  201 -  225 2 units  226-  250 3 units  251 -  275 4 units  276 -  300 5 units  301 -  325 6 units  326 -  350 7 units  351 -  375 8 units    EDUCATION / INSTRUCTIONS:  BG monitoring instructions: Patient is instructed to check her blood sugars 4 times a day, before meals and bedtime.  Call [facility] if: BG persistently < 70 or > 300. . I reviewed the Rule of 15 for the treatment of hypoglycemia in detail with the patient. Literature supplied.    F/U in 8 weeks    Signed electronically by: Mack Guise, MD  [facility]  [facility] [address] [place], [place] [address] Phone: [phone] FAX: [phone]   CC: [address], [place] [address] Phone: [phone]  Fax: [phone]  Return to Endocrinology clinic as below: Future Appointments  Date Time Provider [place]  11/23/2018  9:45 AM Milus Banister, MD [facility]  01/01/2019  8:30 AM , Melanie Crazier, MD [facility] None

## 2018-11-15 NOTE — Patient Instructions (Addendum)
-   Decrease Lantus to 60 units daily - Continue Humalog 10 units with breakfast, 18 units with Lunch and Supper  - Ozempic 0.25 mg weekly   Humalog correctional insulin: ADD extra units on insulin to your meal-time  dose if your blood sugars are higher than 175. Use the scale below to help guide you:   Blood sugar before meal Number of units to inject  Less than 175 0 unit  176 -  200 1 units  201 -  225 2 units  226-  250 3 units  251 -  275 4 units  276 -  300 5 units  301 -  325 6 units  326 -  350 7 units  351 -  375 8 units     HOW TO TREAT LOW BLOOD SUGARS (Blood sugar LESS THAN 70 MG/DL)  Please follow the RULE OF 15 for the treatment of hypoglycemia treatment (when your (blood sugars are less than 70 mg/dL)    STEP 1: Take 15 grams of carbohydrates when your blood sugar is low, which includes:   3-4 GLUCOSE TABS  OR  3-4 OZ OF JUICE OR REGULAR SODA OR  ONE TUBE OF GLUCOSE GEL     STEP 2: RECHECK blood sugar in 15 MINUTES STEP 3: If your blood sugar is still low at the 15 minute recheck --> then, go back to STEP 1 and treat AGAIN with another 15 grams of carbohydrates.

## 2018-11-21 ENCOUNTER — Other Ambulatory Visit: Payer: Self-pay | Admitting: Family Medicine

## 2018-11-23 ENCOUNTER — Encounter: Payer: Self-pay | Admitting: Gastroenterology

## 2018-11-23 ENCOUNTER — Other Ambulatory Visit (INDEPENDENT_AMBULATORY_CARE_PROVIDER_SITE_OTHER): Payer: Medicare Other

## 2018-11-23 ENCOUNTER — Ambulatory Visit: Payer: Medicare Other | Admitting: Gastroenterology

## 2018-11-23 VITALS — BP 130/68 | HR 76 | Ht 62.5 in | Wt 183.0 lb

## 2018-11-23 DIAGNOSIS — Z860100 Personal history of colon polyps, unspecified: Secondary | ICD-10-CM

## 2018-11-23 DIAGNOSIS — I8500 Esophageal varices without bleeding: Secondary | ICD-10-CM

## 2018-11-23 LAB — COMPREHENSIVE METABOLIC PANEL
ALT: 56 U/L — ABNORMAL HIGH (ref 0–35)
AST: 60 U/L — ABNORMAL HIGH (ref 0–37)
Albumin: 4 g/dL (ref 3.5–5.2)
Alkaline Phosphatase: 100 U/L (ref 39–117)
BUN: 10 mg/dL (ref 6–23)
CO2: 31 mEq/L (ref 19–32)
Calcium: 9.7 mg/dL (ref 8.4–10.5)
Chloride: 97 mEq/L (ref 96–112)
Creatinine, Ser: 0.6 mg/dL (ref 0.40–1.20)
GFR: 105.27 mL/min (ref >60.00)
Glucose, Bld: 169 mg/dL — ABNORMAL HIGH (ref 70–99)
Potassium: 3.3 mEq/L — ABNORMAL LOW (ref 3.5–5.1)
Sodium: 137 mEq/L (ref 135–145)
Total Bilirubin: 1.1 mg/dL (ref 0.2–1.2)
Total Protein: 7.6 g/dL (ref 6.0–8.3)

## 2018-11-23 LAB — CBC WITH DIFFERENTIAL/PLATELET
Basophils Absolute: 0 10*3/uL (ref 0.0–0.1)
Basophils Relative: 0.5 % (ref 0.0–3.0)
EOS PCT: 3.5 % (ref 0.0–5.0)
Eosinophils Absolute: 0.2 10*3/uL (ref 0.0–0.7)
HCT: 36.5 % (ref 36.0–46.0)
Hemoglobin: 12.3 g/dL (ref 12.0–15.0)
Lymphocytes Relative: 31.1 % (ref 12.0–46.0)
Lymphs Abs: 1.5 10*3/uL (ref 0.7–4.0)
MCHC: 33.8 g/dL (ref 30.0–36.0)
MCV: 83.8 fl (ref 78.0–100.0)
MONOS PCT: 10 % (ref 3.0–12.0)
Monocytes Absolute: 0.5 10*3/uL (ref 0.1–1.0)
Neutro Abs: 2.6 10*3/uL (ref 1.4–7.7)
Neutrophils Relative %: 54.9 % (ref 43.0–77.0)
Platelets: 142 10*3/uL — ABNORMAL LOW (ref 150.0–400.0)
RBC: 4.35 Mil/uL (ref 3.87–5.11)
RDW: 14.8 % (ref 11.5–15.5)
WBC: 4.7 10*3/uL (ref 4.0–10.5)

## 2018-11-23 LAB — PROTIME-INR
INR: 1 ratio (ref 0.8–1.0)
Prothrombin Time: 12.2 s (ref 9.6–13.1)

## 2018-11-23 MED ORDER — PEG 3350-KCL-NA BICARB-NACL 420 G PO SOLR: 4000.0000 mL | ORAL | 0 refills | Status: DC

## 2018-11-23 NOTE — Progress Notes (Signed)
Review of pertinent gastrointestinal problems: 1.  History of adenomatous polyps in her colon.  Colonoscopy 2006 Dr. Timmothy Euler found no polyps.  Colonoscopy Dr. Ardis Hughs December 2016 found 7 subcentimeter polyps.  These were all adenomatous polyps on pathology.  Also documented left-sided diverticulosis.  I recommend she have a repeat colonoscopy at 3-year interval.   HPI: [name]   who was referred to me by Eulas Post, MD  to evaluate elevated liver tests, history of polyps, bloating.    Chief complaint is elevated liver test, history of polyps, bloating   CBC September 2019 was normal except for platelet count of 130,000.   Hemoglobin A1c November 2019 was 10.2.  Very elevated.  LFTS normal except slightly elevated ast/alt for many many years.  [place] 07/2018: slightly nodular, duspsicious for cirrhosis.  Hep C Ab negative, Hep B Surface Ag negative, Hep B Core Igm negative, Hep A IgM negative. Ferritin normal, HIV negative, ANA negative  Always had digestive issues.  Feels very slow moving GI tract.  Tends to be constipated.  Was told she had Schatzki's ring in 1980s.  Very rare solid food dyaphagia.  Has been gaining  Weight 5 pounds, 6 months.  Takes colon clenser for constipation. She has also found an enzyme that helps her constipation.  Fiber supplement causes worse constipation.  Was exhausted, for many years.  Found to have low Vit B.  Plts a bit low.  Never a bit etoh drinker.  Never hepatitis that she is aware.  Father was an alcoholic.   Review of systems: Pertinent positive and negative review of systems were noted in the above HPI section. All other review negative.   Past Medical History:  Diagnosis Date  . ALLERGIC RHINITIS 10/09/2007  . Arthritis    hips, spine  . DIABETES MELLITUS, UNCONTROLLED 03/16/2009  . FATIGUE 10/21/2008  . FIBROMYALGIA 10/09/2007  . GERD (gastroesophageal reflux disease)    diet  controlled, no  meds  . H/O vaginal hysterectomy   . HYPERLIPIDEMIA 10/09/2007   diet controlled, no meds  . HYPERTENSION 10/09/2007  . Morton's neuroma   . OBSTRUCTIVE SLEEP APNEA 10/09/2007  . Plantar fasciitis    History - right  . Sleep apnea    Uses CPAP  . TRANSAMINASES, SERUM, ELEVATED 07/08/2009    Past Surgical History:  Procedure Laterality Date  . ABDOMINAL HYSTERECTOMY  1979   prolapsed uterus  . BASAL CELL CARCINOMA EXCISION    . BREAST BIOPSY     left 2012 stereo  . BREAST REDUCTION SURGERY Bilateral 1992  . COLONOSCOPY  2006   [facility]  . mamoplasty     reduction  . REDUCTION MAMMAPLASTY Bilateral   . TONSILLECTOMY  1959  . TUBAL LIGATION  1976    Current Outpatient Medications  Medication Sig Dispense Refill  . amLODipine (NORVASC) 5 MG tablet Take 1 tablet (5 mg total) by mouth daily. 90 tablet 2  . b complex vitamins capsule Take 1 capsule by mouth daily.    . BD ULTRA-FINE PEN NEEDLES 29G X 12.7MM MISC USE AS DIRECTED 3 TIMES DAILY. 300 each 2  . escitalopram (LEXAPRO) 10 MG tablet TAKE ONE TABLET BY MOUTH ONE TIME DAILY 90 tablet 0  . hydrochlorothiazide (HYDRODIURIL) 25 MG tablet Take 1 tablet (25 mg total) by mouth daily. 90 tablet 3  . Insulin Glargine (LANTUS SOLOSTAR) 100 UNIT/ML Solostar Pen Inject 60 Units into the skin at bedtime. 45 pen 2  .  insulin lispro (HUMALOG KWIKPEN) 100 UNIT/ML KiwkPen INJECT 15 UNITS TWICE DAILY AS DIRECTED 9 pen 0  . losartan (COZAAR) 100 MG tablet Take 1 tablet (100 mg total) by mouth daily. 90 tablet 3  . metFORMIN (GLUCOPHAGE) 1000 MG tablet TAKE 1 TABLET BY MOUTH TWICE A DAY 180 tablet 2  . MISC NATURAL PRODUCTS PO Take by mouth.    Glory Rosebush DELICA LANCETS 34V MISC CHECK 3 TIMES DAILY. 300 each 1  . ONETOUCH VERIO test strip TEST 3 TIMES DAILY DX E11.9 300 each 1  . Semaglutide,0.25 or 0.5MG/DOS, (OZEMPIC, 0.25 OR 0.5 MG/DOSE,) 2 MG/1.5ML SOPN Inject 0.25 mg into the skin once a week. 1 pen 11   Current Facility-Administered  Medications  Medication Dose Route Frequency Provider Last Rate Last Dose  . TDaP (BOOSTRIX) injection 0.5 mL  0.5 mL Intramuscular Once Eulas Post, MD        Allergies as of 11/23/2018 - Review Complete 11/23/2018  Allergen Reaction Noted  . Amoxicillin-pot clavulanate  03/09/2012  . Codeine sulfate  03/16/2009  . Crestor [rosuvastatin calcium] Other (See Comments) 03/10/2016  . Erythromycin base  04/07/2009  . Invokana [canagliflozin] Other (See Comments) 05/18/2015  . Irbesartan-hydrochlorothiazide  04/07/2009  . Latex    . Victoza [liraglutide] Nausea Only 03/10/2016    Family History  Problem Relation Age of Onset  . Diabetes Mother   . Arthritis Mother   . Cancer Mother        breast  . Breast cancer Mother 53  . Alcohol abuse Father   . Arthritis Father   . Hypertension Maternal Grandfather   . Miscarriages / Stillbirths Paternal Grandfather   . Breast cancer Maternal Aunt        diagnosed in her 60's  . Colon polyps Neg Hx   . Colon cancer Neg Hx   . Esophageal cancer Neg Hx   . Rectal cancer Neg Hx   . Stomach cancer Neg Hx     Social History   Socioeconomic History  . Marital status: Married    Spouse name: Not on file  . Number of children: Not on file  . Years of education: Not on file  . Highest education level: Not on file  Occupational History  . Not on file  Social Needs  . Financial resource strain: Not on file  . Food insecurity:    Worry: Not on file    Inability: Not on file  . Transportation needs:    Medical: Not on file    Non-medical: Not on file  Tobacco Use  . Smoking status: Never Smoker  . Smokeless tobacco: Never Used  Substance and Sexual Activity  . Alcohol use: No    Alcohol/week: 0.0 standard drinks  . Drug use: No  . Sexual activity: Yes    Birth control/protection: Other-see comments, Post-menopausal    Comment: Hysterectomy  Lifestyle  . Physical activity:    Days per week: Not on file    Minutes per  session: Not on file  . Stress: Not on file  Relationships  . Social connections:    Talks on phone: Not on file    Gets together: Not on file    Attends religious service: Not on file    Active member of club or organization: Not on file    Attends meetings of clubs or organizations: Not on file    Relationship status: Not on file  . Intimate partner violence:    Fear of current  or ex partner: Not on file    Emotionally abused: Not on file    Physically abused: Not on file    Forced sexual activity: Not on file  Other Topics Concern  . Not on file  Social History Narrative  . Not on file     Physical Exam: BP 130/68   Pulse 76   Ht 5' 2.5" (1.588 m)   Wt 183 lb (83 kg)   BMI 32.94 kg/m  Constitutional: generally well-appearing Psychiatric: alert and oriented x3 Eyes: extraocular movements intact Mouth: oral pharynx moist, no lesions Neck: supple no lymphadenopathy Cardiovascular: heart regular rate and rhythm Lungs: clear to auscultation bilaterally Abdomen: soft, nontender, nondistended, no obvious ascites, no peritoneal signs, normal bowel sounds Extremities: no lower extremity edema bilaterally Skin: no lesions on visible extremities   Assessment and plan: 67 y.o. female with mild chronic constipation, probable cirrhosis, elevated liver tests, bloating, history of polyps  First she has a history of precancerous polyps and was due for surveillance around now and so we will arrange for that colonoscopy.  Second I think there is a good chance that she has underlying cirrhosis.  If she does it is probably fatty liver related as she has been obese for 15 to 20 years now.  She had some viral testing but she needs further testing.  See the labs below to check for etiologies of her chronic liver disease.  I recommended we proceed with an EGD at her soonest convenience as well, at the same time as her colonoscopy to screen her for portal hypertensive issues, also to check for  other possible causes of her intermittent abdominal pains.  She has out-of-control diabetes with a recent hemoglobin A1c over 10 and that could certainly be causing a lot of her digestive, dyspeptic issues.  She has mild chronic constipation I recommend she try MiraLAX on a daily basis for that.    Please see the "Patient Instructions" section for addition details about the plan.   Dana Loffler, MD [facility] 11/23/2018, 10:12 AM  Cc: Eulas Post, MD

## 2018-11-23 NOTE — Patient Instructions (Addendum)
You will be set up for a colonoscopy for polyp surveillance. You will be set up for an upper endoscopy for variceal screening.  Start once daily miralax.  You will get labs drawn today:  Hepatitis A (total), Hepatitis B surface antibody, AMA, alphafeto protein (AFP), anti smooth muscle antibody, CBC, CMET, INR.  It is important that you have a relatively low salt diet.  High salt diet can cause fluid to accumulate in your legs, abdomen and even around your lungs. You should try to avoid NSAID type over the counter pain medicines as best as possible. Tylenol is safe to take for 'routine' aches and pains, but never take more than 1/2 the dose suggested on the package instructions (never more than 2 grams per day). Avoid alcohol.  Thank you for entrusting me with your care and choosing [facility].  Dr Ardis Hughs

## 2018-11-27 ENCOUNTER — Other Ambulatory Visit: Payer: Self-pay | Admitting: Family Medicine

## 2018-11-29 LAB — HEPATITIS A ANTIBODY, TOTAL: Hepatitis A AB,Total: NONREACTIVE

## 2018-11-29 LAB — AFP TUMOR MARKER: AFP-Tumor Marker: 4.4 ng/mL

## 2018-11-29 LAB — ANTI-SMOOTH MUSCLE ANTIBODY, IGG: Actin (Smooth Muscle) Antibody (IGG): <20 U (ref <20)

## 2018-11-29 LAB — MITOCHONDRIAL ANTIBODIES: Mitochondrial M2 Ab, IgG: <=20 U

## 2018-11-29 LAB — HEPATITIS B SURFACE ANTIBODY,QUALITATIVE: Hep B S Ab: NONREACTIVE

## 2018-11-30 ENCOUNTER — Other Ambulatory Visit: Payer: Self-pay | Admitting: Gastroenterology

## 2018-12-07 ENCOUNTER — Telehealth: Payer: Self-pay | Admitting: Internal Medicine

## 2018-12-07 ENCOUNTER — Other Ambulatory Visit: Payer: Self-pay

## 2018-12-07 DIAGNOSIS — E1165 Type 2 diabetes mellitus with hyperglycemia: Secondary | ICD-10-CM

## 2018-12-07 DIAGNOSIS — Z794 Long term (current) use of insulin: Secondary | ICD-10-CM

## 2018-12-07 MED ORDER — INSULIN PEN NEEDLE 29G X 12.7MM MISC: 1.0000 | Freq: Three times a day (TID) | 11 refills | Status: DC

## 2018-12-07 NOTE — Telephone Encounter (Signed)
MEDICATION: BD ULTRA-FINE PEN NEEDLES 29G X 12.7MM MISC  PHARMACY:  [id number]- [place], [place] - [address] [place] 90 DAY SUPPLY :   IS PATIENT OUT OF MEDICATION:   IF NOT; HOW MUCH IS LEFT: 2   LAST APPOINTMENT DATE: @12 /11/2018  NEXT APPOINTMENT DATE:@1 /28/2020  DO WE HAVE YOUR PERMISSION TO LEAVE A DETAILED MESSAGE:  OTHER COMMENTS:    **Let patient know to contact pharmacy at the end of the day to make sure medication is ready. **  ** Please notify patient to allow 48-72 hours to process**  **Encourage patient to contact the pharmacy for refills or [facility] can [facility]*

## 2018-12-07 NOTE — Telephone Encounter (Signed)
Medication refill sent to pharmacy  

## 2018-12-10 ENCOUNTER — Ambulatory Visit (INDEPENDENT_AMBULATORY_CARE_PROVIDER_SITE_OTHER): Payer: Medicare Other | Admitting: Gastroenterology

## 2018-12-10 DIAGNOSIS — Z23 Encounter for immunization: Secondary | ICD-10-CM | POA: Diagnosis not present

## 2018-12-17 ENCOUNTER — Other Ambulatory Visit: Payer: Self-pay | Admitting: Family Medicine

## 2018-12-18 ENCOUNTER — Ambulatory Visit (INDEPENDENT_AMBULATORY_CARE_PROVIDER_SITE_OTHER): Payer: Medicare Other | Admitting: Gastroenterology

## 2018-12-18 DIAGNOSIS — Z23 Encounter for immunization: Secondary | ICD-10-CM

## 2018-12-19 ENCOUNTER — Encounter: Payer: Self-pay | Admitting: Gastroenterology

## 2018-12-26 ENCOUNTER — Encounter: Payer: Self-pay | Admitting: Gastroenterology

## 2018-12-26 ENCOUNTER — Ambulatory Visit (AMBULATORY_SURGERY_CENTER): Payer: Medicare Other | Admitting: Gastroenterology

## 2018-12-26 VITALS — BP 122/70 | HR 84 | Temp 98.9°F | Resp 11 | Ht 62.5 in | Wt 183.0 lb

## 2018-12-26 DIAGNOSIS — K635 Polyp of colon: Secondary | ICD-10-CM

## 2018-12-26 DIAGNOSIS — D122 Benign neoplasm of ascending colon: Secondary | ICD-10-CM

## 2018-12-26 DIAGNOSIS — I8500 Esophageal varices without bleeding: Secondary | ICD-10-CM

## 2018-12-26 DIAGNOSIS — K766 Portal hypertension: Secondary | ICD-10-CM

## 2018-12-26 DIAGNOSIS — K3189 Other diseases of stomach and duodenum: Secondary | ICD-10-CM

## 2018-12-26 DIAGNOSIS — D120 Benign neoplasm of cecum: Secondary | ICD-10-CM

## 2018-12-26 DIAGNOSIS — D125 Benign neoplasm of sigmoid colon: Secondary | ICD-10-CM

## 2018-12-26 DIAGNOSIS — Z86010 Personal history of colonic polyps: Secondary | ICD-10-CM | POA: Diagnosis not present

## 2018-12-26 MED ORDER — SODIUM CHLORIDE 0.9 % IV SOLN: 500.0000 mL | Freq: Once | INTRAVENOUS | Status: DC

## 2018-12-26 MED ORDER — NADOLOL 20 MG PO TABS: 20.0000 mg | ORAL_TABLET | Freq: Every day | ORAL | 11 refills | Status: DC

## 2018-12-26 NOTE — Op Note (Signed)
[facility] Patient Name: Dana Dyer Procedure Date: 12/26/2018 2:16 PM MRN: [id number] Endoscopist: Milus Banister , MD Age: 67 Referring MD:  Date of Birth: October 18, 1951 Gender: Female Account #: [id number] Procedure:                Colonoscopy Indications:              High risk colon cancer surveillance: Personal                            history of colonic polyps; colonoscoppy 2016 seven                            adenomatous polyps removed Medicines:                Monitored Anesthesia Care Procedure:                Pre-Anesthesia Assessment:                           - Prior to the procedure, a History and Physical                            was performed, and patient medications and                            allergies were reviewed. The patient's tolerance of                            previous anesthesia was also reviewed. The risks                            and benefits of the procedure and the sedation                            options and risks were discussed with the patient.                            All questions were answered, and informed consent                            was obtained. Prior Anticoagulants: The patient has                            taken no previous anticoagulant or antiplatelet                            agents. ASA Grade Assessment: III - A patient with                            severe systemic disease. After reviewing the risks                            and benefits, the patient was deemed in  satisfactory condition to undergo the procedure.                           After obtaining informed consent, the colonoscope                            was passed under direct vision. Throughout the                            procedure, the patient's blood pressure, pulse, and                            oxygen saturations were monitored continuously. The                            Colonoscope was introduced  through the anus and                            advanced to the the cecum, identified by                            appendiceal orifice and ileocecal valve. The                            colonoscopy was performed without difficulty. The                            patient tolerated the procedure well. The quality                            of the bowel preparation was good. The ileocecal                            valve, appendiceal orifice, and rectum were                            photographed. Scope In: 2:25:59 PM Scope Out: 2:47:57 PM Scope Withdrawal Time: 0 hours 18 minutes 13 seconds  Total Procedure Duration: 0 hours 21 minutes 58 seconds  Findings:                 Three sessile polyps were found in the proximal                            sigmoid colon, ascending colon and cecum. The                            polyps were 3 to 5 mm in size. These polyps were                            removed with a cold snare. Resection and retrieval                            were complete.  A 10 mm polyp was found in the mid sigmoid colon.                            The polyp was semi-pedunculated and slightly firm.                            The polyp was removed with a hot snare. Resection                            and retrieval were complete. Area was successfully                            injected with Spot (carbon black) for tattooing                            (two injections, adjacent to the polypectomy site).                           The exam was otherwise without abnormality on                            direct and retroflexion views. Complications:            No immediate complications. Estimated blood loss:                            None. Estimated Blood Loss:     Estimated blood loss: none. Impression:               - Three 3 to 5 mm polyps in the proximal sigmoid                            colon, in the ascending colon and in the cecum,                             removed with a cold snare. Resected and retrieved.                            jar 1.                           - One, somewhat firm 10 mm polyp in the mid sigmoid                            colon, removed with a hot snare. Resected and                            retrieved. jar 2. The polypectomy site was injected                            with Spot for future localization.                           - The examination was  otherwise normal on direct                            and retroflexion views. Recommendation:           - Patient has a contact number available for                            emergencies. The signs and symptoms of potential                            delayed complications were discussed with the                            patient. Return to normal activities tomorrow.                            Written discharge instructions were provided to the                            patient.                           - Resume previous diet.                           - Continue present medications.                           - Await pathology results.                           - EGD now to screen for varices. Milus Banister, MD 12/26/2018 3:01:07 PM This report has been signed electronically.

## 2018-12-26 NOTE — Progress Notes (Signed)
Called to room to assist during endoscopic procedure.  Patient ID and intended procedure confirmed with present staff. Received instructions for my participation in the procedure from the performing physician.  

## 2018-12-26 NOTE — Patient Instructions (Signed)
Discharge instructions given. Handout on polyps. Biopsies taken. Resume previous medications. Prescription sent to pharmacy. Office will call to schedule appointments. YOU HAD AN ENDOSCOPIC PROCEDURE TODAY AT [facility]:   Refer to the procedure report that was given to you for any specific questions about what was found during the examination.  If the procedure report does not answer your questions, please call your gastroenterologist to clarify.  If you requested that your care partner not be given the details of your procedure findings, then the procedure report has been included in a sealed envelope for you to review at your convenience later.  YOU SHOULD EXPECT: Some feelings of bloating in the abdomen. Passage of more gas than usual.  Walking can help get rid of the air that was put into your GI tract during the procedure and reduce the bloating. If you had a lower endoscopy (such as a colonoscopy or flexible sigmoidoscopy) you may notice spotting of blood in your stool or on the toilet paper. If you underwent a bowel prep for your procedure, you may not have a normal bowel movement for a few days.  Please Note:  You might notice some irritation and congestion in your nose or some drainage.  This is from the oxygen used during your procedure.  There is no need for concern and it should clear up in a day or so.  SYMPTOMS TO REPORT IMMEDIATELY:   Following lower endoscopy (colonoscopy or flexible sigmoidoscopy):  Excessive amounts of blood in the stool  Significant tenderness or worsening of abdominal pains  Swelling of the abdomen that is new, acute  Fever of 100F or higher   Following upper endoscopy (EGD)  Vomiting of blood or coffee ground material  New chest pain or pain under the shoulder blades  Painful or persistently difficult swallowing  New shortness of breath  Fever of 100F or higher  Black, tarry-looking stools  For urgent or emergent issues, a  gastroenterologist can be reached at any hour by calling [phone].   DIET:  We do recommend a small meal at first, but then you may proceed to your regular diet.  Drink plenty of fluids but you should avoid alcoholic beverages for 24 hours.  ACTIVITY:  You should plan to take it easy for the rest of today and you should NOT DRIVE or use heavy machinery until tomorrow (because of the sedation medicines used during the test).    FOLLOW UP: Our staff will call the number listed on your records the next business day following your procedure to check on you and address any questions or concerns that you may have regarding the information given to you following your procedure. If we do not reach you, we will leave a message.  However, if you are feeling well and you are not experiencing any problems, there is no need to return our call.  We will assume that you have returned to your regular daily activities without incident.  If any biopsies were taken you will be contacted by phone or by letter within the next 1-3 weeks.  Please call us at [phone] if you have not heard about the biopsies in 3 weeks.    SIGNATURES/CONFIDENTIALITY: You and/or your care partner have signed paperwork which will be entered into your electronic medical record.  These signatures attest to the fact that that the information above on your After Visit Summary has been reviewed and is understood.  Full responsibility of the confidentiality of  this discharge information lies with you and/or your care-partner.

## 2018-12-26 NOTE — Op Note (Signed)
[facility] Patient Name: Dana Dyer Procedure Date: 12/26/2018 2:16 PM MRN: [id number] Endoscopist: Milus Banister , MD Age: 68 Referring MD:  Date of Birth: 06/20/1951 Gender: Female Account #: [id number] Procedure:                Upper GI endoscopy Indications:              Cirrhosis rule out esophageal varices Medicines:                Monitored Anesthesia Care Procedure:                Pre-Anesthesia Assessment:                           - Prior to the procedure, a History and Physical                            was performed, and patient medications and                            allergies were reviewed. The patient's tolerance of                            previous anesthesia was also reviewed. The risks                            and benefits of the procedure and the sedation                            options and risks were discussed with the patient.                            All questions were answered, and informed consent                            was obtained. Prior Anticoagulants: The patient has                            taken no previous anticoagulant or antiplatelet                            agents. ASA Grade Assessment: III - A patient with                            severe systemic disease. After reviewing the risks                            and benefits, the patient was deemed in                            satisfactory condition to undergo the procedure.                           After obtaining informed consent, the endoscope was  passed under direct vision. Throughout the                            procedure, the patient's blood pressure, pulse, and                            oxygen saturations were monitored continuously. The                            Endoscope was introduced through the mouth, and                            advanced to the second part of duodenum. The upper                            GI endoscopy  was accomplished without difficulty.                            The patient tolerated the procedure well. Scope In: Scope Out: Findings:                 Small (< 5 mm) varices were found in the lower                            third of the esophagus.                           Mild portal gastropathy changes throughout the                            stomach.                           No gastric varices.                           The exam was otherwise without abnormality. Complications:            No immediate complications. Estimated blood loss:                            None. Estimated Blood Loss:     Estimated blood loss: none. Impression:               - Small (< 5 mm) esophageal varices.                           - Mild portal hypertensive gastropathy.                           - The examination was otherwise normal. No gastric                            varices. Recommendation:           - Patient has a contact number available for  emergencies. The signs and symptoms of potential                            delayed complications were discussed with the                            patient. Return to normal activities tomorrow.                            Written discharge instructions were provided to the                            patient.                           - Resume previous diet.                           - Continue present medications.                           - New start nadolol 68m pills, one pill once                            daily, disp 30 with 11 refills.                           - RN visit in the office in 2 weeks for BP, HR                            check.                           - Office visit with Dr. JArdis Hughsin 4-6 weeks to                            continue care of cirrhosis. DMilus Banister MD 12/26/2018 3:06:39 PM This report has been signed electronically.

## 2018-12-26 NOTE — Progress Notes (Signed)
PT taken to PACU. Monitors in place. VSS. Report given to RN. 

## 2018-12-27 ENCOUNTER — Telehealth: Payer: Self-pay

## 2018-12-27 NOTE — Telephone Encounter (Signed)
  Follow up Call-  Call back number 12/26/2018  Post procedure Call Back phone  # [phone]  Permission to leave phone message Yes  Some recent data might be hidden     Patient questions:  Do you have a fever, pain , or abdominal swelling? No. Pain Score  0 *  Have you tolerated food without any problems? Yes.    Have you been able to return to your normal activities? Yes.    Do you have any questions about your discharge instructions: Diet   No. Medications  No. Follow up visit  No.  Do you have questions or concerns about your Care? No.  Actions: * If pain score is 4 or above: No action needed, pain <4.  Follow up Call-  Call back number 12/26/2018  Post procedure Call Back phone  # [phone]  Permission to leave phone message Yes  Some recent data might be hidden     Patient questions:  Do you have a fever, pain , or abdominal swelling? No. Pain Score  0 *  Have you tolerated food without any problems? Yes.    Have you been able to return to your normal activities? Yes.    Do you have any questions about your discharge instructions: Diet   No. Medications  No. Follow up visit  No.  Do you have questions or concerns about your Care? No.  Actions: * If pain score is 4 or above: No action needed, pain <4.

## 2018-12-27 NOTE — Telephone Encounter (Signed)
First post procedure follow up call, no answer 

## 2018-12-28 ENCOUNTER — Telehealth: Payer: Self-pay | Admitting: Internal Medicine

## 2018-12-28 NOTE — Telephone Encounter (Signed)
At this time, we have not received any paperwork for this patient. I will be looking for any paperwork from [facility] and will be filled out, signed by MD, and faxed back.

## 2018-12-28 NOTE — Telephone Encounter (Signed)
[facility] was calling in regards to faxing over documents. They will also refax today. Please Advise, thanks

## 2018-12-31 ENCOUNTER — Ambulatory Visit (INDEPENDENT_AMBULATORY_CARE_PROVIDER_SITE_OTHER): Payer: Medicare Other | Admitting: Gastroenterology

## 2018-12-31 DIAGNOSIS — Z23 Encounter for immunization: Secondary | ICD-10-CM

## 2019-01-01 ENCOUNTER — Ambulatory Visit: Payer: Medicare Other | Admitting: Internal Medicine

## 2019-01-04 ENCOUNTER — Encounter: Payer: Self-pay | Admitting: Gastroenterology

## 2019-01-09 ENCOUNTER — Telehealth: Payer: Self-pay | Admitting: Internal Medicine

## 2019-01-09 NOTE — Telephone Encounter (Signed)
[facility] was calling in regards to status of faxed documents for this patient.  If questions or concerns please call [phone] ext [phone]  Ref # [id number]

## 2019-01-09 NOTE — Telephone Encounter (Signed)
[facility] vm for ext [phone], I have not seen any paperwork for this pt, please re-fax attn Charisse March

## 2019-01-10 ENCOUNTER — Ambulatory Visit: Payer: Medicare Other | Admitting: Gastroenterology

## 2019-01-10 ENCOUNTER — Encounter: Payer: Self-pay | Admitting: Gastroenterology

## 2019-01-10 VITALS — BP 130/80 | HR 88

## 2019-01-10 DIAGNOSIS — K7460 Unspecified cirrhosis of liver: Secondary | ICD-10-CM

## 2019-01-14 ENCOUNTER — Telehealth: Payer: Self-pay | Admitting: Internal Medicine

## 2019-01-14 NOTE — Telephone Encounter (Signed)
[facility] is calling to check the status of paper work that they refaxed on the [place].   They would like to make sure we have received this    Phone- [phone] Ex [place]- [phone]

## 2019-01-15 NOTE — Telephone Encounter (Signed)
Called [facility] and requested that they refax this paperwork.

## 2019-01-18 ENCOUNTER — Telehealth: Payer: Self-pay

## 2019-01-18 ENCOUNTER — Other Ambulatory Visit: Payer: Self-pay | Admitting: Gastroenterology

## 2019-01-18 MED ORDER — NADOLOL 40 MG PO TABS: 40.0000 mg | ORAL_TABLET | Freq: Every day | ORAL | 5 refills | Status: DC

## 2019-01-18 NOTE — Telephone Encounter (Signed)
Spoke to patient. Informed her that Dr Ardis Hughs would like her to increase her Nadolol to 40 mg daily and return to office in 2 weeks OV for 01/30/19. Patient voiced understanding and will start her new dose today.

## 2019-01-18 NOTE — Telephone Encounter (Signed)
-----   Message from Audrea Muscat, [place] sent at 01/17/2019  4:43 PM EST -----  ----- Message ----- From: Milus Banister, MD Sent: 01/17/2019   7:20 AM EST To: Audrea Muscat, CMA  Magda Paganini,  Thanks.  Can you please instruct her to increase her nadolol to 37m once daily.  She'll need a new prescription for 1 month, 5 refills. She has ROv with me in 2 weeks, will recheck her vitals then and adjust nadolol dose further if appropriate.  Thanks  ----- Message ----- From: WAudrea Muscat CMA Sent: 01/10/2019  10:26 AM EST To: DMilus Banister MD  For your information, above patient's blood pressure today was 130/80 and her pulse was 88.

## 2019-01-21 ENCOUNTER — Telehealth: Payer: Self-pay

## 2019-01-21 NOTE — Telephone Encounter (Signed)
[facility] is calling in regards to paperwork that was refaxed to our office.  They would like to make sure we have received this   PHONE- [phone] Ex [phone]  Confidential voicemail if a message needs to be left REF- [id number]

## 2019-01-30 ENCOUNTER — Other Ambulatory Visit (INDEPENDENT_AMBULATORY_CARE_PROVIDER_SITE_OTHER): Payer: Medicare Other

## 2019-01-30 ENCOUNTER — Ambulatory Visit: Payer: Medicare Other | Admitting: Gastroenterology

## 2019-01-30 ENCOUNTER — Encounter: Payer: Self-pay | Admitting: Gastroenterology

## 2019-01-30 VITALS — BP 126/62 | HR 70 | Ht 63.0 in | Wt 186.6 lb

## 2019-01-30 DIAGNOSIS — K7460 Unspecified cirrhosis of liver: Secondary | ICD-10-CM

## 2019-01-30 LAB — CBC WITH DIFFERENTIAL/PLATELET
Basophils Absolute: 0 10*3/uL (ref 0.0–0.1)
Basophils Relative: 0.4 % (ref 0.0–3.0)
Eosinophils Absolute: 0.3 10*3/uL (ref 0.0–0.7)
Eosinophils Relative: 4.2 % (ref 0.0–5.0)
HCT: 32.4 % — ABNORMAL LOW (ref 36.0–46.0)
HEMOGLOBIN: 10.7 g/dL — AB (ref 12.0–15.0)
Lymphocytes Relative: 27.7 % (ref 12.0–46.0)
Lymphs Abs: 1.7 10*3/uL (ref 0.7–4.0)
MCHC: 33 g/dL (ref 30.0–36.0)
MCV: 79.2 fl (ref 78.0–100.0)
Monocytes Absolute: 0.7 10*3/uL (ref 0.1–1.0)
Monocytes Relative: 10.9 % (ref 3.0–12.0)
Neutro Abs: 3.4 10*3/uL (ref 1.4–7.7)
Neutrophils Relative %: 56.8 % (ref 43.0–77.0)
Platelets: 155 10*3/uL (ref 150.0–400.0)
RBC: 4.08 Mil/uL (ref 3.87–5.11)
RDW: 14.4 % (ref 11.5–15.5)
WBC: 6.1 10*3/uL (ref 4.0–10.5)

## 2019-01-30 NOTE — Patient Instructions (Addendum)
You will be set up for an ultrasound for hepatoma screening.  You will have labs checked today in the basement lab.  Please head down after you check out with the front desk  (cbc).  Start pepcid (famotidine) 61m pills at bedtime.  Please return to see Dr. JArdis Hughsin 6 months.   It is important that you have a relatively low salt diet.  High salt diet can cause fluid to accumulate in your legs, abdomen and even around your lungs. You should try to avoid NSAID type over the counter pain medicines as best as possible. Tylenol is safe to take for 'routine' aches and pains, but never take more than 1/2 the dose suggested on the package instructions (never more than 2 grams per day). Avoid alcohol.   [facility] on 02/04/19 at 10am. Please arrive 15 minutes prior to your appointment for registration. Make certain not to have anything to eat or drink 6 hours prior to your appointment. Should you need to reschedule your appointment, please contact radiology at [phone] This test typically takes about 30 minutes to perform.  Thank you for [facility]usting [facility] with your [facility] and [facility]  Dr JArdis Hughs

## 2019-01-30 NOTE — Progress Notes (Signed)
Review of pertinent gastrointestinal problems: 1.  History of adenomatous polyps in her colon.  Colonoscopy 2006 Dr. Timmothy Euler found no polyps.  Colonoscopy Dr. Ardis Hughs December 2016 found 7 subcentimeter polyps.  These were all adenomatous polyps on pathology.  Also documented left-sided diverticulosis.  I recommend she have a repeat colonoscopy at 3-year interval.   Colonoscopy January 2024 polyps were removed from her colon, all were adenomatous on pathology.  She was recommended to have repeat colonoscopy at 3-year interval 2. Cirrhosis, likely from longstanding fatty liver disease, out-of-control diabetes.  Diagnosed 2019.  Lab workup 2019: Hep C Ab negative, Hep B Surface Ag negative, hepatitis B surface antibody negative, hep B Core Igm negative, Hep A IgM negative. Ferritin normal, HIV negative, ANA negative, antimitochondrial antibody negative, hepatitis a antibody total negative; slightly low platelets, transaminases slightly elevated.  No edema.  [place] 07/2018: slightly nodular, suspicious for cirrhosis.  EGD 12/2018 small distal esophagus varices, mild portal gastropathy, no gastric varices.  She was started on nadolol  AFP 11/2018 4.4  MELD 7 (11/2018 labs)  Hepatitis A, B vaccination series started 11/2018    HPI: This is a very pleasant 68 year old woman whom I last saw at the time of her upper endoscopy January 2020.  She has recently diagnosed cirrhosis likely from longstanding fatty liver disease.  She had a single episode of epistaxis since then but no overt hematemesis, no obvious red rectal bleeding.  No signs of encephalopathy, no difficulty with swelling in her legs or her abdomen.  She has had some mild pyrosis  Since increasing nadolol from 20 mg daily to 40 mg daily she has been a bit fatigued.  Never short of breath or dizzy when standing   ROS: complete GI ROS as described in HPI, all other review negative.  Constitutional:  No unintentional weight loss   Past  Medical History:  Diagnosis Date  . ALLERGIC RHINITIS 10/09/2007  . Allergy   . Arthritis    hips, spine  . DIABETES MELLITUS, UNCONTROLLED 03/16/2009  . FATIGUE 10/21/2008  . FIBROMYALGIA 10/09/2007  . GERD (gastroesophageal reflux disease)    diet  controlled, no meds  . H/O vaginal hysterectomy   . HYPERLIPIDEMIA 10/09/2007   diet controlled, no meds  . HYPERTENSION 10/09/2007  . Morton's neuroma   . OBSTRUCTIVE SLEEP APNEA 10/09/2007  . Plantar fasciitis    History - right  . Sleep apnea    Uses CPAP  . TRANSAMINASES, SERUM, ELEVATED 07/08/2009    Past Surgical History:  Procedure Laterality Date  . ABDOMINAL HYSTERECTOMY  1979   prolapsed uterus  . BASAL CELL CARCINOMA EXCISION    . BREAST BIOPSY     left 2012 stereo  . BREAST REDUCTION SURGERY Bilateral 1992  . COLONOSCOPY  2006   [facility]  . mamoplasty     reduction  . REDUCTION MAMMAPLASTY Bilateral   . TONSILLECTOMY  1959  . TUBAL LIGATION  1976    Current Outpatient Medications  Medication Sig Dispense Refill  . amLODipine (NORVASC) 5 MG tablet Take 1 tablet (5 mg total) by mouth daily. 90 tablet 2  . b complex vitamins capsule Take 1 capsule by mouth daily.    Marland Kitchen escitalopram (LEXAPRO) 10 MG tablet TAKE ONE TABLET BY MOUTH ONE TIME DAILY 90 tablet 0  . hydrochlorothiazide (HYDRODIURIL) 25 MG tablet Take 1 tablet (25 mg total) by mouth daily. 90 tablet 3  . Insulin Glargine (LANTUS SOLOSTAR) 100 UNIT/ML Solostar Pen Inject 60 Units into the  skin at bedtime. 45 pen 2  . insulin lispro (HUMALOG KWIKPEN) 100 UNIT/ML KiwkPen INJECT 15 UNITS TWICE DAILY AS DIRECTED 9 pen 0  . Insulin Pen Needle (BD ULTRA-FINE PEN NEEDLES) 29G X 12.7MM MISC Inject 1 each into the skin 3 (three) times daily. 100 each 11  . losartan (COZAAR) 100 MG tablet Take 1 tablet (100 mg total) by mouth daily. 90 tablet 3  . metFORMIN (GLUCOPHAGE) 1000 MG tablet TAKE 1 TABLET BY MOUTH TWICE A DAY 180 tablet 2  . MISC NATURAL PRODUCTS PO Take by  mouth.    . nadolol (CORGARD) 40 MG tablet Take 1 tablet (40 mg total) by mouth daily. 30 tablet 5  . ONETOUCH DELICA LANCETS 37J MISC CHECK 3 TIMES DAILY. 300 each 0  . ONETOUCH VERIO test strip TEST 3 TIMES DAILY DX E11.9 300 each 1  . Semaglutide,0.25 or 0.5MG/DOS, (OZEMPIC, 0.25 OR 0.5 MG/DOSE,) 2 MG/1.5ML SOPN Inject 0.25 mg into the skin once a week. 1 pen 11   Current Facility-Administered Medications  Medication Dose Route Frequency Provider Last Rate Last Dose  . TDaP (BOOSTRIX) injection 0.5 mL  0.5 mL Intramuscular Once Eulas Post, MD        Allergies as of 01/30/2019 - Review Complete 01/30/2019  Allergen Reaction Noted  . Amoxicillin-pot clavulanate  03/09/2012  . Codeine sulfate  03/16/2009  . Crestor [rosuvastatin calcium] Other (See Comments) 03/10/2016  . Erythromycin base  04/07/2009  . Invokana [canagliflozin] Other (See Comments) 05/18/2015  . Irbesartan-hydrochlorothiazide  04/07/2009  . Latex    . Victoza [liraglutide] Nausea Only 03/10/2016    Family History  Problem Relation Age of Onset  . Diabetes Mother   . Arthritis Mother   . Cancer Mother        breast  . Breast cancer Mother 36  . Alcohol abuse Father   . Arthritis Father   . Hypertension Maternal Grandfather   . Miscarriages / Stillbirths Paternal Grandfather   . Breast cancer Maternal Aunt        diagnosed in her 5's  . Colon polyps Neg Hx   . Colon cancer Neg Hx   . Esophageal cancer Neg Hx   . Rectal cancer Neg Hx   . Stomach cancer Neg Hx     Social History   Socioeconomic History  . Marital status: Married    Spouse name: Not on file  . Number of children: Not on file  . Years of education: Not on file  . Highest education level: Not on file  Occupational History  . Not on file  Social Needs  . Financial resource strain: Not on file  . Food insecurity:    Worry: Not on file    Inability: Not on file  . Transportation needs:    Medical: Not on file     Non-medical: Not on file  Tobacco Use  . Smoking status: Never Smoker  . Smokeless tobacco: Never Used  Substance and Sexual Activity  . Alcohol use: No    Alcohol/week: 0.0 standard drinks  . Drug use: No  . Sexual activity: Yes    Birth control/protection: Other-see comments, Post-menopausal    Comment: Hysterectomy  Lifestyle  . Physical activity:    Days per week: Not on file    Minutes per session: Not on file  . Stress: Not on file  Relationships  . Social connections:    Talks on phone: Not on file    Gets together: Not on  file    Attends religious service: Not on file    Active member of club or organization: Not on file    Attends meetings of clubs or organizations: Not on file    Relationship status: Not on file  . Intimate partner violence:    Fear of current or ex partner: Not on file    Emotionally abused: Not on file    Physically abused: Not on file    Forced sexual activity: Not on file  Other Topics Concern  . Not on file  Social History Narrative  . Not on file     Physical Exam: BP 126/62   Pulse 70   Ht 5' 3"  (1.6 m)   Wt 186 lb 9.6 oz (84.6 kg)   BMI 33.05 kg/m  Constitutional: generally well-appearing Psychiatric: alert and oriented x3 Abdomen: soft, nontender, nondistended, no obvious ascites, no peritoneal signs, normal bowel sounds No peripheral edema noted in lower extremities  Assessment and plan: 68 y.o. female with   She has small esophageal varices currently on 40 mg daily nadolol for primary prophylaxis.  She has been feeling a bit fatigued since that dose was increased from 20-40 and so I do not want to make any adjustments to it currently.  She has had some epistaxis lately, twice in the past week.  Probably related to her CPAP and dry air but I am ordering a CBC to check her platelet count and to see if she is anemic which I doubt will be the case.  She has some mild nocturnal GERD symptoms and I recommended famotidine 20 mg once  daily.  She needs hepatoma screening with an ultrasound now, we will order that.  She understands she will need hepatoma screening with ultrasound and alpha-fetoprotein about every 6 months.  I would like to see her again in 6 months and sooner if any issues.  Please see the "Patient Instructions" section for addition details about the plan.  Owens Loffler, MD [facility] 01/30/2019, 8:45 AM

## 2019-02-05 ENCOUNTER — Ambulatory Visit: Payer: Medicare Other | Admitting: Internal Medicine

## 2019-02-05 ENCOUNTER — Ambulatory Visit (HOSPITAL_COMMUNITY): Payer: Medicare Other

## 2019-02-06 ENCOUNTER — Ambulatory Visit: Payer: Medicare Other | Admitting: Internal Medicine

## 2019-02-06 ENCOUNTER — Encounter: Payer: Self-pay | Admitting: Internal Medicine

## 2019-02-06 ENCOUNTER — Other Ambulatory Visit: Payer: Self-pay

## 2019-02-06 VITALS — BP 112/70 | HR 90 | Temp 98.2°F | Ht 63.0 in | Wt 187.2 lb

## 2019-02-06 DIAGNOSIS — K7460 Unspecified cirrhosis of liver: Secondary | ICD-10-CM

## 2019-02-06 DIAGNOSIS — E1165 Type 2 diabetes mellitus with hyperglycemia: Secondary | ICD-10-CM | POA: Diagnosis not present

## 2019-02-06 DIAGNOSIS — Z794 Long term (current) use of insulin: Secondary | ICD-10-CM | POA: Diagnosis not present

## 2019-02-06 LAB — POCT GLYCOSYLATED HEMOGLOBIN (HGB A1C): Hemoglobin A1C: 8.4 % — AB (ref 4.0–5.6)

## 2019-02-06 MED ORDER — INSULIN PEN NEEDLE 32G X 4 MM MISC: 3 refills | Status: DC

## 2019-02-06 MED ORDER — METFORMIN HCL 1000 MG PO TABS: 1000.0000 mg | ORAL_TABLET | Freq: Two times a day (BID) | ORAL | 3 refills | Status: DC

## 2019-02-06 MED ORDER — INSULIN GLARGINE 100 UNIT/ML SOLOSTAR PEN: 54.0000 [IU] | PEN_INJECTOR | Freq: Every day | SUBCUTANEOUS | 3 refills | Status: DC

## 2019-02-06 MED ORDER — INSULIN LISPRO (1 UNIT DIAL) 100 UNIT/ML (KWIKPEN): PEN_INJECTOR | SUBCUTANEOUS | 3 refills | Status: DC

## 2019-02-06 MED ORDER — SEMAGLUTIDE(0.25 OR 0.5MG/DOS) 2 MG/1.5ML ~~LOC~~ SOPN: 0.2500 mg | PEN_INJECTOR | SUBCUTANEOUS | 3 refills | Status: DC

## 2019-02-06 MED ORDER — INSULIN GLARGINE 100 UNIT/ML SOLOSTAR PEN: 60.0000 [IU] | PEN_INJECTOR | Freq: Every day | SUBCUTANEOUS | 3 refills | Status: DC

## 2019-02-06 NOTE — Addendum Note (Signed)
Addended by: Dorita Sciara on: 02/06/2019 12:32 PM   Modules accepted: Orders

## 2019-02-06 NOTE — Telephone Encounter (Signed)
Spoke with Denzil Hughes and they will be refaxing paperwork to both fax machines located in this office.

## 2019-02-06 NOTE — Progress Notes (Signed)
Name: Dana Dyer  Age/ Sex: 68 y.o., female   MRN/ DOB: [id number], [birth year]     PCP: Dana Post, MD   Reason for Endocrinology Evaluation: Type 2 Diabetes Mellitus  Initial Endocrine Consultative Visit: 10/23/18    PATIENT IDENTIFIER: Dana Dyer is a 68 y.o. female with a past medical history of HTN, T2DM, Cirrhosis, Fibromyalgia and dyslipidemia . The patient has followed with Endocrinology clinic since 10/23/18 for consultative assistance with management of her diabetes.  DIABETIC HISTORY:  Dana Dyer was diagnosed with T2DM in 2005. She used to be on SU but does not recall intolerance. She is intolerant to victoza and invokana. She was started on insulin therapy in 2009. Her hemoglobin A1c has ranged from 7.8 % in 2012, peaking at 10.9% in 2018.   SUBJECTIVE:   During the last visit (11/15/18): Her A1c was 10.2%.we started Ozempic, we reduced lantus and increased Humalog dose for lunch and supper.   Today (02/06/2019): Dana Dyer is here for a 8 week follow up on diabetes management.  She checks her blood sugars 1 times daily. The patient has not had hypoglycemic episodes since the last clinic visit, but she did have tight BG's in the mornings.  Otherwise, the patient has not required any recent emergency interventions for hypoglycemia and has not had recent hospitalizations secondary to hyper or hypoglycemic episodes.   She has been doing well on the Ozempic with minor nausea that has been improving. She has seen GI recently for follow-up on liver cirrhosis.    ROS: As per HPI and as detailed below: Review of Systems  HENT: Negative for congestion and sore throat.   Eyes: Negative for blurred vision and pain.  Respiratory: Negative for cough and shortness of breath.   Cardiovascular: Negative for chest pain and palpitations.  Gastrointestinal: Negative for abdominal pain,  diarrhea and nausea.      HOME DIABETES REGIMEN:  Lantus 60 units daily Humalog 10 units with Breakfast  Humalog 18 units with lunch and supper  Metformin 1000 mg BID  Ozempic 0.25 mg weekly  Correctional Humalog : BG- 150/25     METER DOWNLOAD SUMMARY: Unable to download   Fasting 111- 169 mg/dL     HISTORY:  Past Medical History:  Past Medical History:  Diagnosis Date  . ALLERGIC RHINITIS 10/09/2007  . Allergy   . Arthritis    hips, spine  . DIABETES MELLITUS, UNCONTROLLED 03/16/2009  . FATIGUE 10/21/2008  . FIBROMYALGIA 10/09/2007  . GERD (gastroesophageal reflux disease)    diet  controlled, no meds  . H/O vaginal hysterectomy   . HYPERLIPIDEMIA 10/09/2007   diet controlled, no meds  . HYPERTENSION 10/09/2007  . Morton's neuroma   . OBSTRUCTIVE SLEEP APNEA 10/09/2007  . Plantar fasciitis    History - right  . Sleep apnea    Uses CPAP  . TRANSAMINASES, SERUM, ELEVATED 07/08/2009   Past Surgical History:  Past Surgical History:  Procedure Laterality Date  . ABDOMINAL HYSTERECTOMY  1979   prolapsed uterus  . BASAL CELL CARCINOMA EXCISION    . BREAST BIOPSY     left 2012 stereo  . BREAST REDUCTION SURGERY Bilateral 1992  . COLONOSCOPY  2006   [facility]  . mamoplasty     reduction  . REDUCTION MAMMAPLASTY Bilateral   . TONSILLECTOMY  1959  . TUBAL LIGATION  1976    Social History:  reports that she has never smoked. She has never used smokeless tobacco. She  reports that she does not drink alcohol or use drugs. Family History:  Family History  Problem Relation Age of Onset  . Diabetes Mother   . Arthritis Mother   . Cancer Mother        breast  . Breast cancer Mother 10  . Alcohol abuse Father   . Arthritis Father   . Hypertension Maternal Grandfather   . Miscarriages / Stillbirths Paternal Grandfather   . Breast cancer Maternal Aunt        diagnosed in her 12's  . Colon polyps Neg Hx   . Colon cancer Neg Hx   . Esophageal cancer Neg Hx   . Rectal  cancer Neg Hx   . Stomach cancer Neg Hx      HOME MEDICATIONS: Allergies as of 02/06/2019      Reactions   Amoxicillin-pot Clavulanate    Severe diarrhea   Codeine Sulfate    REACTION: "hyper"   Crestor [rosuvastatin Calcium] Other (See Comments)   myalgia   Erythromycin Base    Invokana [canagliflozin] Other (See Comments)   Recurrent yeast infections   Irbesartan-hydrochlorothiazide    Latex    Victoza [liraglutide] Nausea Only      Medication List       Accurate as of February 06, 2019 12:19 PM. Always use your most recent med list.        amLODipine 5 MG tablet Commonly known as:  NORVASC Take 1 tablet (5 mg total) by mouth daily.   b complex vitamins capsule Take 1 capsule by mouth daily.   escitalopram 10 MG tablet Commonly known as:  LEXAPRO TAKE ONE TABLET BY MOUTH ONE TIME DAILY   hydrochlorothiazide 25 MG tablet Commonly known as:  HYDRODIURIL Take 1 tablet (25 mg total) by mouth daily.   Insulin Glargine 100 UNIT/ML Solostar Pen Commonly known as:  LANTUS SOLOSTAR Inject 60 Units into the skin at bedtime.   insulin lispro 100 UNIT/ML KiwkPen Commonly known as:  HUMALOG KWIKPEN INJECT 15 UNITS TWICE DAILY AS DIRECTED   Insulin Pen Needle 29G X 12.7MM Misc Commonly known as:  BD ULTRA-FINE PEN NEEDLES Inject 1 each into the skin 3 (three) times daily.   losartan 100 MG tablet Commonly known as:  COZAAR Take 1 tablet (100 mg total) by mouth daily.   metFORMIN 1000 MG tablet Commonly known as:  GLUCOPHAGE TAKE 1 TABLET BY MOUTH TWICE A DAY   MISC NATURAL PRODUCTS PO Take by mouth.   nadolol 40 MG tablet Commonly known as:  CORGARD Take 1 tablet (40 mg total) by mouth daily.   ONETOUCH DELICA LANCETS 63K Misc CHECK 3 TIMES DAILY.   ONETOUCH VERIO test strip Generic drug:  glucose blood TEST 3 TIMES DAILY DX E11.9   Semaglutide(0.25 or 0.5MG/DOS) 2 MG/1.5ML Sopn Commonly known as:  OZEMPIC (0.25 OR 0.5 MG/DOSE) Inject 0.25 mg into the  skin once a week.        OBJECTIVE:   Vital Signs: BP 112/70 (BP Location: Right Arm, Patient Position: Sitting, Cuff Size: Large)   Pulse 90   Temp 98.2 F (36.8 C) (Oral)   Ht 5' 3"  (1.6 m)   Wt 187 lb 3.2 oz (84.9 kg)   SpO2 98%   BMI 33.16 kg/m   Wt Readings from Last 3 Encounters:  02/06/19 187 lb 3.2 oz (84.9 kg)  01/30/19 186 lb 9.6 oz (84.6 kg)  12/26/18 183 lb (83 kg)     Exam: General: Pt appears well and  is in NAD  Lungs: Clear with good BS bilat with no rales, rhonchi, or wheezes  Heart: RRR with normal S1 and S2 and no gallops; no murmurs; no rub  Abdomen: Normoactive bowel sounds, soft, nontender, without masses or organomegaly palpable  Extremities: No pretibial edema. No tremor. Normal strength and motion throughout.  Neuro: MS is good with appropriate affect, pt is alert and Ox3    DATA REVIEWED:  Lab Results  Component Value Date   HGBA1C 8.4 (A) 02/06/2019   HGBA1C 10.2 (A) 10/12/2018   HGBA1C 8.8 (H) 06/25/2018   Lab Results  Component Value Date   MICROALBUR <0.7 09/21/2016   LDLCALC 104 (H) 02/06/2018   CREATININE 0.60 11/23/2018     ASSESSMENT / PLAN / RECOMMENDATIONS:   1) Type 2 Diabetes Mellitus, poorly controlled, With neuropathic complications - Most recent A1c of 8.4 %. Goal A1c < 7.0 %.   Plan:  -Her A1c has trended down from 10.2% to 8.4%, which is a great improvement in her glucose control. -Patient has been checking glucose once a day, during fasting hours, despite being on 4 injections of insulin a day.  I have encouraged her to continue glucose checks as much as she can, we discussed the risk of hypoglycemia with insulin especially when she is not checking right before she eats.  We also discussed the last opportunity of correcting high sugars right before a meal when she does not she does not check her sugars. -Interview of her meter today, her fasting glucose has been variable between 80s milligrams per deciliter and to as  high as 169 mg/dL, patient does admit to binge eating at night at times.  Which could explain her high fasting glucose. -Based on tight BG's in the mornings, I will reduce her Lantus to prevent hypoglycemia.  We have discussed that if she continues to wake up with her sugars in the morning she may need to have her Humalog dose increased with supper. -I will not increase her Ozempic, due to continued intermittent nausea.  MEDICATIONS: -Decrease Lantus to 54 units daily - Continue Humalog 10 units with breakfast, 18 units with Lunch and Supper  - Continue Ozempic 0.25 mg weekly   Humalog correctional insulin: ADD extra units on insulin to your meal-time  dose if your blood sugars are higher than 175. Use the scale below to help guide you:   Blood sugar before meal Number of units to inject  Less than 175 0 unit  176 -  200 1 units  201 -  225 2 units  226-  250 3 units  251 -  275 4 units  276 -  300 5 units  301 -  325 6 units  326 -  350 7 units  351 -  375 8 units    EDUCATION / INSTRUCTIONS:  BG monitoring instructions: Patient is instructed to check her blood sugars 4 times a day, before meals and bedtime.  Call [facility] if: BG persistently < 70 or > 300. . I reviewed the Rule of 15 for the treatment of hypoglycemia in detail with the patient. Literature supplied.    F/U in 12 weeks    Signed electronically by: Mack Guise, MD  [facility]  [facility] [address] [place], [place] [address] Phone: [phone] FAX: [phone]   CC: Dana Dyer, [place] [address] Phone: [phone]  Fax: [phone]  Return to Endocrinology  clinic as below: Future Appointments  Date Time Provider [place]  02/08/2019  9:00 AM [facility] 1 [facility] [place]  05/09/2019  9:10 AM , Melanie Crazier, MD [facility] None

## 2019-02-06 NOTE — Patient Instructions (Signed)
-   Decrease Lantus to 54 units daily - Continue Humalog 10 units with breakfast, 18 units with Lunch and Supper  - Continue Ozempic 0.25 mg weekly   Humalog correctional insulin: ADD extra units on insulin to your meal-time  dose if your blood sugars are higher than 175. Use the scale below to help guide you:   Blood sugar before meal Number of units to inject  Less than 175 0 unit  176 -  200 1 units  201 -  225 2 units  226-  250 3 units  251 -  275 4 units  276 -  300 5 units  301 -  325 6 units  326 -  350 7 units  351 -  375 8 units     HOW TO TREAT LOW BLOOD SUGARS (Blood sugar LESS THAN 70 MG/DL)  Please follow the RULE OF 15 for the treatment of hypoglycemia treatment (when your (blood sugars are less than 70 mg/dL)    STEP 1: Take 15 grams of carbohydrates when your blood sugar is low, which includes:   3-4 GLUCOSE TABS  OR  3-4 OZ OF JUICE OR REGULAR SODA OR  ONE TUBE OF GLUCOSE GEL     STEP 2: RECHECK blood sugar in 15 MINUTES STEP 3: If your blood sugar is still low at the 15 minute recheck --> then, go back to STEP 1 and treat AGAIN with another 15 grams of carbohydrates.

## 2019-02-08 ENCOUNTER — Ambulatory Visit (HOSPITAL_COMMUNITY)
Admission: RE | Admit: 2019-02-08 | Discharge: 2019-02-08 | Disposition: A | Discharge status: Home / Self Care | Payer: Medicare Other | Source: Ambulatory Visit | Attending: Gastroenterology | Admitting: Gastroenterology

## 2019-02-08 DIAGNOSIS — K7460 Unspecified cirrhosis of liver: Secondary | ICD-10-CM | POA: Diagnosis present

## 2019-02-18 ENCOUNTER — Other Ambulatory Visit: Payer: Self-pay | Admitting: Family Medicine

## 2019-02-24 ENCOUNTER — Other Ambulatory Visit: Payer: Self-pay | Admitting: Family Medicine

## 2019-04-09 ENCOUNTER — Encounter: Payer: Self-pay | Admitting: Family Medicine

## 2019-04-11 ENCOUNTER — Other Ambulatory Visit: Payer: Self-pay

## 2019-04-11 ENCOUNTER — Ambulatory Visit (INDEPENDENT_AMBULATORY_CARE_PROVIDER_SITE_OTHER): Payer: Medicare Other | Admitting: Family Medicine

## 2019-04-11 DIAGNOSIS — K7460 Unspecified cirrhosis of liver: Secondary | ICD-10-CM | POA: Diagnosis not present

## 2019-04-11 DIAGNOSIS — F322 Major depressive disorder, single episode, severe without psychotic features: Secondary | ICD-10-CM | POA: Diagnosis not present

## 2019-04-11 DIAGNOSIS — I10 Essential (primary) hypertension: Secondary | ICD-10-CM

## 2019-04-11 NOTE — Progress Notes (Signed)
Patient ID: Dana Dyer, female   DOB: 10-11-1951, 68 y.o.   MRN: [id number]  This visit type was conducted due to national recommendations for restrictions regarding the COVID-19 pandemic in an effort to limit this patient's exposure and mitigate transmission in our community.   Virtual Visit via Video Note  I connected with Brooklynn Brandenburg on 04/11/19 at  9:30 AM EDT by a video enabled telemedicine application and verified that I am speaking with the correct person using two identifiers.  Location patient: home Location provider:work or home office Persons participating in the virtual visit: patient, provider  I discussed the limitations of evaluation and management by telemedicine and the availability of in person appointments. The patient expressed understanding and agreed to proceed.   HPI: Patient has multiple chronic problems including history of obesity, hypertension, obstructive sleep apnea, type 2 diabetes which is been poorly controlled, fibromyalgia, hyperlipidemia, fatty liver changes, history of depression.  She called earlier with questions about her antidepression medications.  She had severe depression couple years ago with some suicidal ideation.  We initially started SSRI and then added Wellbutrin.  Combination therapy worked great for depression and she seemed to go into remission.  She then noted some jitteriness with the Wellbutrin.  She has some underlying essential tremor.  She stopped Wellbutrin on her own and her tremor improved.  Her depression she thinks is very stable at this time.  She denies any loss of interest in activities.  No suicidal ideation.  She remains on Lexapro 10 mg daily  Followed by endocrinology now for her type 2 diabetes.  She states she has lost some weight past couple months due to her efforts.  She is also eating out less.  Her blood pressures not been monitored recently.  Her current machine is broken.  She had recent addition of nadolol by GI  because of her nonalcoholic cirrhosis.  She has had some mild fatigue from the nadolol but otherwise tolerating well.  She also remains on amlodipine and losartan as well as HCTZ.  Denies any lightheadedness or dizziness.  No chest pains.   ROS: See pertinent positives and negatives per HPI.  Past Medical History:  Diagnosis Date  . ALLERGIC RHINITIS 10/09/2007  . Allergy   . Arthritis    hips, spine  . DIABETES MELLITUS, UNCONTROLLED 03/16/2009  . FATIGUE 10/21/2008  . FIBROMYALGIA 10/09/2007  . GERD (gastroesophageal reflux disease)    diet  controlled, no meds  . H/O vaginal hysterectomy   . HYPERLIPIDEMIA 10/09/2007   diet controlled, no meds  . HYPERTENSION 10/09/2007  . Morton's neuroma   . OBSTRUCTIVE SLEEP APNEA 10/09/2007  . Plantar fasciitis    History - right  . Sleep apnea    Uses CPAP  . TRANSAMINASES, SERUM, ELEVATED 07/08/2009    Past Surgical History:  Procedure Laterality Date  . ABDOMINAL HYSTERECTOMY  1979   prolapsed uterus  . BASAL CELL CARCINOMA EXCISION    . BREAST BIOPSY     left 2012 stereo  . BREAST REDUCTION SURGERY Bilateral 1992  . COLONOSCOPY  2006   [facility]  . mamoplasty     reduction  . REDUCTION MAMMAPLASTY Bilateral   . TONSILLECTOMY  1959  . TUBAL LIGATION  1976    Family History  Problem Relation Age of Onset  . Diabetes Mother   . Arthritis Mother   . Cancer Mother        breast  . Breast cancer Mother 61  . Alcohol  abuse Father   . Arthritis Father   . Hypertension Maternal Grandfather   . Miscarriages / Stillbirths Paternal Grandfather   . Breast cancer Maternal Aunt        diagnosed in her 44's  . Colon polyps Neg Hx   . Colon cancer Neg Hx   . Esophageal cancer Neg Hx   . Rectal cancer Neg Hx   . Stomach cancer Neg Hx     SOCIAL HX: Non-smoker.  She is retired.  Lives with husband   Current Outpatient Medications:  .  amLODipine (NORVASC) 5 MG tablet, Take 1 tablet (5 mg total) by mouth daily., Disp: 90 tablet,  Rfl: 2 .  b complex vitamins capsule, Take 1 capsule by mouth daily., Disp: , Rfl:  .  escitalopram (LEXAPRO) 10 MG tablet, TAKE ONE TABLET BY MOUTH ONE TIME DAILY, Disp: 90 tablet, Rfl: 0 .  hydrochlorothiazide (HYDRODIURIL) 25 MG tablet, Take 1 tablet (25 mg total) by mouth daily., Disp: 90 tablet, Rfl: 3 .  Insulin Glargine (LANTUS SOLOSTAR) 100 UNIT/ML Solostar Pen, Inject 54 Units into the skin at bedtime. Please disregard previous prescription, Disp: 16 pen, Rfl: 3 .  insulin lispro (HUMALOG KWIKPEN) 100 UNIT/ML KwikPen, Inject 0.1 mLs (10 Units total) into the skin daily with breakfast AND 0.18 mLs (18 Units total) 2 (two) times daily before lunch and supper., Disp: 13 pen, Rfl: 3 .  Insulin Pen Needle (BD PEN NEEDLE NANO 2ND GEN) 32G X 4 MM MISC, 4x a day, Disp: 360 each, Rfl: 3 .  losartan (COZAAR) 100 MG tablet, Take 1 tablet (100 mg total) by mouth daily., Disp: 90 tablet, Rfl: 3 .  metFORMIN (GLUCOPHAGE) 1000 MG tablet, Take 1 tablet (1,000 mg total) by mouth 2 (two) times daily., Disp: 180 tablet, Rfl: 3 .  MISC NATURAL PRODUCTS PO, Take by mouth., Disp: , Rfl:  .  nadolol (CORGARD) 40 MG tablet, Take 1 tablet (40 mg total) by mouth daily., Disp: 30 tablet, Rfl: 5 .  ONETOUCH DELICA LANCETS 53I MISC, CHECK 3 TIMES DAILY., Disp: 300 each, Rfl: 0 .  ONETOUCH VERIO test strip, TEST 3 TIMES DAILY DX E11.9, Disp: 300 each, Rfl: 1 .  Semaglutide,0.25 or 0.5MG/DOS, (OZEMPIC, 0.25 OR 0.5 MG/DOSE,) 2 MG/1.5ML SOPN, Inject 0.25 mg into the skin once a week., Disp: 2 pen, Rfl: 3  Current Facility-Administered Medications:  .  TDaP (BOOSTRIX) injection 0.5 mL, 0.5 mL, Intramuscular, Once, , Alinda Sierras, MD  EXAM:  VITALS per patient if applicable:  GENERAL: alert, oriented, appears well and in no acute distress  HEENT: atraumatic, conjunttiva clear, no obvious abnormalities on inspection of external nose and ears  NECK: normal movements of the head and neck  LUNGS: on inspection no  signs of respiratory distress, breathing rate appears normal, no obvious gross SOB, gasping or wheezing  CV: no obvious cyanosis  MS: moves all visible extremities without noticeable abnormality  PSYCH/NEURO: pleasant and cooperative, no obvious depression or anxiety, speech and thought processing grossly intact  ASSESSMENT AND PLAN:  Discussed the following assessment and plan:  #1 history of depression-currently stable on Lexapro 10 mg daily.  Previous side effect with Wellbutrin with increased tremor -Be in touch for any recurrent depression symptoms  #2 hypertension -Continue to monitor at home and be in touch of consistent readings over 140/90 -We recommended office follow-up in 3 to 4 months to reassess blood pressure  #3 nonalcoholic cirrhosis secondary to fatty liver changes.  She had extensive work-up  previously by GI.  Recent addition of nadolol  #4 type 2 diabetes poorly controlled now followed by endocrinology     I discussed the assessment and treatment plan with the patient. The patient was provided an opportunity to ask questions and all were answered. The patient agreed with the plan and demonstrated an understanding of the instructions.   The patient was advised to call back or seek an in-person evaluation if the symptoms worsen or if the condition fails to improve as anticipated.     Carolann Littler, MD

## 2019-05-03 ENCOUNTER — Telehealth: Payer: Self-pay | Admitting: Gastroenterology

## 2019-05-03 ENCOUNTER — Other Ambulatory Visit: Payer: Self-pay | Admitting: Gastroenterology

## 2019-05-03 MED ORDER — NADOLOL 40 MG PO TABS: 40.0000 mg | ORAL_TABLET | Freq: Every day | ORAL | 0 refills | Status: DC

## 2019-05-03 NOTE — Telephone Encounter (Signed)
Prescription sent patient notified.

## 2019-05-09 ENCOUNTER — Ambulatory Visit: Payer: Medicare Other | Admitting: Internal Medicine

## 2019-05-09 ENCOUNTER — Telehealth: Payer: Self-pay | Admitting: Family Medicine

## 2019-05-09 NOTE — Telephone Encounter (Unsigned)
Copied from [place] [phone]. Topic: Quick Communication - Rx Refill/Question >> May 09, 2019  4:52 PM Mathis Bud wrote: Medication: escitalopram (LEXAPRO) 10 MG tablet   Has the patient contacted their pharmacy? No refills   Preferred Pharmacy (with phone number or street name): [id number] [place] [phone](Phone) [phone](Fax)    Agent: Please be advised that RX refills may take up to 3 business days. We ask that you follow-up with your pharmacy.

## 2019-05-13 ENCOUNTER — Other Ambulatory Visit: Payer: Self-pay

## 2019-05-13 ENCOUNTER — Other Ambulatory Visit: Payer: Self-pay | Admitting: Family Medicine

## 2019-05-13 DIAGNOSIS — Z1231 Encounter for screening mammogram for malignant neoplasm of breast: Secondary | ICD-10-CM

## 2019-05-13 MED ORDER — ESCITALOPRAM OXALATE 10 MG PO TABS: 10.0000 mg | ORAL_TABLET | Freq: Every day | ORAL | 0 refills | Status: DC

## 2019-05-13 NOTE — Telephone Encounter (Signed)
Medication was sent in

## 2019-05-24 ENCOUNTER — Other Ambulatory Visit: Payer: Self-pay | Admitting: Family Medicine

## 2019-05-28 NOTE — Telephone Encounter (Signed)
Relation to pt: self  Call back number:[phone] Pharmacy:  [id number] [place] [phone]Phone) [phone](Fax)    Reason for call:  Patient checking on the status of amLODipine (NORVASC) 5 MG tablet request, informed patient please allow 48 to 72 hour turn around time.

## 2019-05-29 ENCOUNTER — Telehealth: Payer: Self-pay

## 2019-05-29 NOTE — Telephone Encounter (Signed)
Called patient and LMOVM to return call  [place] [facility] to Discuss results / PCP / recommendations / Schedule patient  Left a detailed voice message to have patient call back to schedule an in office AWV with Dr. Elease Hashimoto.  CRM Created.

## 2019-05-29 NOTE — Telephone Encounter (Signed)
Copied from [place] [id number]. Topic: Appointment Scheduling - Scheduling Inquiry for Clinic >> May 24, 2019  4:49 PM Yvette Rack wrote: Reason for CRM: Pt would like to schedule a wellness visit with Dr. Elease Hashimoto. Pt requests call back >> May 28, 2019  1:09 PM Oneta Rack wrote: Relation to pt: self  Call back number: [phone]    Reason for call:   Patient checking on the status of AWV appointment, patient states she would like face to face appointment

## 2019-05-30 ENCOUNTER — Ambulatory Visit (INDEPENDENT_AMBULATORY_CARE_PROVIDER_SITE_OTHER): Payer: Medicare Other | Admitting: Podiatry

## 2019-05-30 ENCOUNTER — Ambulatory Visit (INDEPENDENT_AMBULATORY_CARE_PROVIDER_SITE_OTHER): Payer: Medicare Other

## 2019-05-30 ENCOUNTER — Ambulatory Visit: Payer: Medicare Other

## 2019-05-30 ENCOUNTER — Other Ambulatory Visit: Payer: Self-pay

## 2019-05-30 DIAGNOSIS — M779 Enthesopathy, unspecified: Secondary | ICD-10-CM | POA: Diagnosis not present

## 2019-05-30 DIAGNOSIS — E1149 Type 2 diabetes mellitus with other diabetic neurological complication: Secondary | ICD-10-CM | POA: Diagnosis not present

## 2019-05-30 DIAGNOSIS — B351 Tinea unguium: Secondary | ICD-10-CM | POA: Diagnosis not present

## 2019-05-30 DIAGNOSIS — L97519 Non-pressure chronic ulcer of other part of right foot with unspecified severity: Secondary | ICD-10-CM

## 2019-05-30 DIAGNOSIS — M21619 Bunion of unspecified foot: Secondary | ICD-10-CM | POA: Diagnosis not present

## 2019-05-30 DIAGNOSIS — L84 Corns and callosities: Secondary | ICD-10-CM | POA: Diagnosis not present

## 2019-05-30 DIAGNOSIS — D3610 Benign neoplasm of peripheral nerves and autonomic nervous system, unspecified: Secondary | ICD-10-CM

## 2019-05-30 NOTE — Patient Instructions (Signed)
Diabetes Mellitus and Foot Care Foot care is an important part of your health, especially when you have diabetes. Diabetes may cause you to have problems because of poor blood flow (circulation) to your feet and legs, which can cause your skin to:  Become thinner and drier.  Break more easily.  Heal more slowly.  Peel and crack. You may also have nerve damage (neuropathy) in your legs and feet, causing decreased feeling in them. This means that you may not notice minor injuries to your feet that could lead to more serious problems. Noticing and addressing any potential problems early is the best way to prevent future foot problems. How to care for your feet Foot hygiene  Wash your feet daily with warm water and mild soap. Do not use hot water. Then, pat your feet and the areas between your toes until they are completely dry. Do not soak your feet as this can dry your skin.  Trim your toenails straight across. Do not dig under them or around the cuticle. File the edges of your nails with an emery board or nail file.  Apply a moisturizing lotion or petroleum jelly to the skin on your feet and to dry, brittle toenails. Use lotion that does not contain alcohol and is unscented. Do not apply lotion between your toes. Shoes and socks  Wear clean socks or stockings every day. Make sure they are not too tight. Do not wear knee-high stockings since they may decrease blood flow to your legs.  Wear shoes that fit properly and have enough cushioning. Always look in your shoes before you put them on to be sure there are no objects inside.  To break in new shoes, wear them for just a few hours a day. This prevents injuries on your feet. Wounds, scrapes, corns, and calluses  Check your feet daily for blisters, cuts, bruises, sores, and redness. If you cannot see the bottom of your feet, use a mirror or ask someone for help.  Do not cut corns or calluses or try to remove them with medicine.  If you  find a minor scrape, cut, or break in the skin on your feet, keep it and the skin around it clean and dry. You may clean these areas with mild soap and water. Do not clean the area with peroxide, alcohol, or iodine.  If you have a wound, scrape, corn, or callus on your foot, look at it several times a day to make sure it is healing and not infected. Check for: ? Redness, swelling, or pain. ? Fluid or blood. ? Warmth. ? Pus or a bad smell. General instructions  Do not cross your legs. This may decrease blood flow to your feet.  Do not use heating pads or hot water bottles on your feet. They may burn your skin. If you have lost feeling in your feet or legs, you may not know this is happening until it is too late.  Protect your feet from hot and cold by wearing shoes, such as at the beach or on hot pavement.  Schedule a complete foot exam at least once a year (annually) or more often if you have foot problems. If you have foot problems, report any cuts, sores, or bruises to your health care provider immediately. Contact a health care provider if:  You have a medical condition that increases your risk of infection and you have any cuts, sores, or bruises on your feet.  You have an injury that is not  healing.  You have redness on your legs or feet.  You feel burning or tingling in your legs or feet.  You have pain or cramps in your legs and feet.  Your legs or feet are numb.  Your feet always feel cold.  You have pain around a toenail. Get help right away if:  You have a wound, scrape, corn, or callus on your foot and: ? You have pain, swelling, or redness that gets worse. ? You have fluid or blood coming from the wound, scrape, corn, or callus. ? Your wound, scrape, corn, or callus feels warm to the touch. ? You have pus or a bad smell coming from the wound, scrape, corn, or callus. ? You have a fever. ? You have a red line going up your leg. Summary  Check your feet every day  for cuts, sores, red spots, swelling, and blisters.  Moisturize feet and legs daily.  Wear shoes that fit properly and have enough cushioning.  If you have foot problems, report any cuts, sores, or bruises to your health care provider immediately.  Schedule a complete foot exam at least once a year (annually) or more often if you have foot problems. This information is not intended to replace advice given to you by your health care provider. Make sure you discuss any questions you have with your health care provider. Document Released: 11/18/2000 Document Revised: 01/03/2018 Document Reviewed: 12/23/2016 Elsevier Interactive Patient Education  2019 [employer].   Neuropathic Pain Neuropathic pain is pain caused by damage to the nerves that are responsible for certain sensations in your body (sensory nerves). The pain can be caused by:  Damage to the sensory nerves that send signals to your spinal cord and brain (peripheral nervous system).  Damage to the sensory nerves in your brain or spinal cord (central nervous system). Neuropathic pain can make you more sensitive to pain. Even a minor sensation can feel very painful. This is usually a long-term condition that can be difficult to treat. The type of pain differs from person to person. It may:  Start suddenly (acute), or it may develop slowly and last for a long time (chronic).  Come and go as damaged nerves heal, or it may stay at the same level for years.  Cause emotional distress, loss of sleep, and a lower quality of life. What are the causes? The most common cause of this condition is diabetes. Many other diseases and conditions can also cause neuropathic pain. Causes of neuropathic pain can be classified as:  Toxic. This is caused by medicines and chemicals. The most common cause of toxic neuropathic pain is damage from cancer treatments (chemotherapy).  Metabolic. This can be caused by: ? Diabetes. This is the most common  disease that damages the nerves. ? Lack of vitamin B from long-term alcohol abuse.  Traumatic. Any injury that cuts, crushes, or stretches a nerve can cause damage and pain. A common example is feeling pain after losing an arm or leg (phantom limb pain).  Compression-related. If a sensory nerve gets trapped or compressed for a long period of time, the blood supply to the nerve can be cut off.  Vascular. Many blood vessel diseases can cause neuropathic pain by decreasing blood supply and oxygen to nerves.  Autoimmune. This type of pain results from diseases in which the body's defense system (immune system) mistakenly attacks sensory nerves. Examples of autoimmune diseases that can cause neuropathic pain include lupus and multiple sclerosis.  Infectious. Many  types of viral infections can damage sensory nerves and cause pain. Shingles infection is a common cause of this type of pain.  Inherited. Neuropathic pain can be a symptom of many diseases that are passed down through families (genetic). What increases the risk? You are more likely to develop this condition if:  You have diabetes.  You smoke.  You drink too much alcohol.  You are taking certain medicines, including medicines that kill cancer cells (chemotherapy) or that treat immune system disorders. What are the signs or symptoms? The main symptom is pain. Neuropathic pain is often described as:  Burning.  Shock-like.  Stinging.  Hot or cold.  Itching. How is this diagnosed? No single test can diagnose neuropathic pain. It is diagnosed based on:  Physical exam and your symptoms. Your health care provider will ask you about your pain. You may be asked to use a pain scale to describe how bad your pain is.  Tests. These may be done to see if you have a high sensitivity to pain and to help find the cause and location of any sensory nerve damage. They include: ? Nerve conduction studies to test how well nerve signals travel  through your sensory nerves (electrodiagnostic testing). ? Stimulating your sensory nerves through electrodes on your skin and measuring the response in your spinal cord and brain (somatosensory evoked potential).  Imaging studies, such as: ? X-rays. ? CT scan. ? MRI. How is this treated? Treatment for neuropathic pain may change over time. You may need to try different treatment options or a combination of treatments. Some options include:  Treating the underlying cause of the neuropathy, such as diabetes, kidney disease, or vitamin deficiencies.  Stopping medicines that can cause neuropathy, such as chemotherapy.  Medicine to relieve pain. Medicines may include: ? Prescription or over-the-counter pain medicine. ? Anti-seizure medicine. ? Antidepressant medicines. ? Pain-relieving patches that are applied to painful areas of skin. ? A medicine to numb the area (local anesthetic), which can be injected as a nerve block.  Transcutaneous nerve stimulation. This uses electrical currents to block painful nerve signals. The treatment is painless.  Alternative treatments, such as: ? Acupuncture. ? Meditation. ? Massage. ? Physical therapy. ? Pain management programs. ? Counseling. Follow these instructions at home: Medicines   Take over-the-counter and prescription medicines only as told by your health care provider.  Do not drive or use heavy machinery while taking prescription pain medicine.  If you are taking prescription pain medicine, take actions to prevent or treat constipation. Your health care provider may recommend that you: ? Drink enough fluid to keep your urine pale yellow. ? Eat foods that are high in fiber, such as fresh fruits and vegetables, whole grains, and beans. ? Limit foods that are high in fat and processed sugars, such as fried or sweet foods. ? Take an over-the-counter or prescription medicine for constipation. Lifestyle   Have a good support system  at home.  Consider joining a chronic pain support group.  Do not use any products that contain nicotine or tobacco, such as cigarettes and e-cigarettes. If you need help quitting, ask your health care provider.  Do not drink alcohol. General instructions  Learn as much as you can about your condition.  Work closely with all your health care providers to find the treatment plan that works best for you.  Ask your health care provider what activities are safe for you.  Keep all follow-up visits as told by your health care  provider. This is important. Contact a health care provider if:  Your pain treatments are not working.  You are having side effects from your medicines.  You are struggling with tiredness (fatigue), mood changes, depression, or anxiety. Summary  Neuropathic pain is pain caused by damage to the nerves that are responsible for certain sensations in your body (sensory nerves).  Neuropathic pain may come and go as damaged nerves heal, or it may stay at the same level for years.  Neuropathic pain is usually a long-term condition that can be difficult to treat. Consider joining a chronic pain support group. This information is not intended to replace advice given to you by your health care provider. Make sure you discuss any questions you have with your health care provider. Document Released: 08/18/2004 Document Revised: 12/08/2017 Document Reviewed: 12/08/2017 Elsevier Interactive Patient Education  2019 [employer].

## 2019-05-31 ENCOUNTER — Other Ambulatory Visit: Payer: Self-pay | Admitting: Podiatry

## 2019-05-31 DIAGNOSIS — M779 Enthesopathy, unspecified: Secondary | ICD-10-CM

## 2019-06-02 NOTE — Progress Notes (Signed)
Subjective:   Patient ID: Dana Dyer, female   DOB: 68 y.o.   MRN: [id number]   HPI 68 year old female presents the office today for concerns of a possible corn to the right second toe.  She did not hurt but she says her big toe started to go over her second toe.  She states that she is not had any redness or drainage.  She also has neuropathy symptoms and having burning to her feet.  She is also treating the left big toenail.  She states that she has some fungus on the are nail.  She denies any redness or drainage or any swelling.  No other concerns.  She is diabetic and her last A1c was 8.4.   Review of Systems  All other systems reviewed and are negative.  Past Medical History:  Diagnosis Date  . ALLERGIC RHINITIS 10/09/2007  . Allergy   . Arthritis    hips, spine  . DIABETES MELLITUS, UNCONTROLLED 03/16/2009  . FATIGUE 10/21/2008  . FIBROMYALGIA 10/09/2007  . GERD (gastroesophageal reflux disease)    diet  controlled, no meds  . H/O vaginal hysterectomy   . HYPERLIPIDEMIA 10/09/2007   diet controlled, no meds  . HYPERTENSION 10/09/2007  . Morton's neuroma   . OBSTRUCTIVE SLEEP APNEA 10/09/2007  . Plantar fasciitis    History - right  . Sleep apnea    Uses CPAP  . TRANSAMINASES, SERUM, ELEVATED 07/08/2009    Past Surgical History:  Procedure Laterality Date  . ABDOMINAL HYSTERECTOMY  1979   prolapsed uterus  . BASAL CELL CARCINOMA EXCISION    . BREAST BIOPSY     left 2012 stereo  . BREAST REDUCTION SURGERY Bilateral 1992  . COLONOSCOPY  2006   [facility]  . mamoplasty     reduction  . REDUCTION MAMMAPLASTY Bilateral   . TONSILLECTOMY  1959  . TUBAL LIGATION  1976     Current Outpatient Medications:  .  NON FORMULARY, [employer]  Anti-fungal-#1, Disp: , Rfl:  .  amLODipine (NORVASC) 5 MG tablet, TAKE 1 TABLET BY MOUTH EVERY DAY, Disp: 90 tablet, Rfl: 0 .  b complex vitamins capsule, Take 1 capsule by mouth daily., Disp: , Rfl:  .  escitalopram (LEXAPRO) 10  MG tablet, Take 1 tablet (10 mg total) by mouth daily., Disp: 90 tablet, Rfl: 0 .  hydrochlorothiazide (HYDRODIURIL) 25 MG tablet, Take 1 tablet (25 mg total) by mouth daily., Disp: 90 tablet, Rfl: 3 .  Insulin Glargine (LANTUS SOLOSTAR) 100 UNIT/ML Solostar Pen, Inject 54 Units into the skin at bedtime. Please disregard previous prescription, Disp: 16 pen, Rfl: 3 .  insulin lispro (HUMALOG KWIKPEN) 100 UNIT/ML KwikPen, Inject 0.1 mLs (10 Units total) into the skin daily with breakfast AND 0.18 mLs (18 Units total) 2 (two) times daily before lunch and supper., Disp: 13 pen, Rfl: 3 .  Insulin Pen Needle (BD PEN NEEDLE NANO 2ND GEN) 32G X 4 MM MISC, 4x a day, Disp: 360 each, Rfl: 3 .  losartan (COZAAR) 100 MG tablet, Take 1 tablet (100 mg total) by mouth daily., Disp: 90 tablet, Rfl: 3 .  metFORMIN (GLUCOPHAGE) 1000 MG tablet, Take 1 tablet (1,000 mg total) by mouth 2 (two) times daily., Disp: 180 tablet, Rfl: 3 .  MISC NATURAL PRODUCTS PO, Take by mouth., Disp: , Rfl:  .  nadolol (CORGARD) 40 MG tablet, Take 1 tablet (40 mg total) by mouth daily., Disp: 90 tablet, Rfl: 0 .  ONETOUCH DELICA LANCETS 93G MISC,  CHECK 3 TIMES DAILY., Disp: 300 each, Rfl: 0 .  ONETOUCH VERIO test strip, TEST 3 TIMES DAILY DX E11.9, Disp: 300 each, Rfl: 1 .  Semaglutide,0.25 or 0.5MG/DOS, (OZEMPIC, 0.25 OR 0.5 MG/DOSE,) 2 MG/1.5ML SOPN, Inject 0.25 mg into the skin once a week., Disp: 2 pen, Rfl: 3  Current Facility-Administered Medications:  .  TDaP (BOOSTRIX) injection 0.5 mL, 0.5 mL, Intramuscular, Once, Burchette, Alinda Sierras, MD  Allergies  Allergen Reactions  . Amoxicillin-Pot Clavulanate     Severe diarrhea  . Codeine Sulfate     REACTION: "hyper"  . Crestor [Rosuvastatin Calcium] Other (See Comments)    myalgia  . Erythromycin Base   . Invokana [Canagliflozin] Other (See Comments)    Recurrent yeast infections  . Irbesartan-Hydrochlorothiazide   . Latex   . Victoza [Liraglutide] Nausea Only          Objective:  Physical Exam  General: AAO x3, NAD  Dermatological: There is a pressure point, minimal hyperkeratotic tissue on the medial aspect of the second digit IPJ from where the hallux and second toe are abutting.  There is no open lesion identified at this time.  The nails are mildly hypertrophic, dystrophic with yellow-brown discoloration.  No pain the nails there is no surrounding redness or drainage or any signs of infection.  No open lesions.  Vascular: Dorsalis Pedis artery and Posterior Tibial artery pedal pulses are 2/4 bilateral with immedate capillary fill time. There is no pain with calf compression, swelling, warmth, erythema.   Neruologic: Sensation mildly decreased with Semmes-Weinstein monofilament.  Musculoskeletal: Bony deformities present and there is abutting of the second first toes resulting in hyperkeratotic lesions, pressure points.  Gait: Unassisted, Nonantalgic.       Assessment:   Procedure the right second toe due to digital deformities, onychomycosis, neuropathy    Plan:  -Treatment options discussed including all alternatives, risks, and complications -Etiology of symptoms were discussed -X-rays were obtained and reviewed with the patient. Bunions present.  No evidence of acute fracture.   -Dispensed offloading pads in order to help with offloading.  Discussed shoe modifications as well. -Ordered a compound cream for onychomycosis -Regards to neuropathy he was to hold off any medications.  We discussed treatments.  She wants to go ahead and try neurogenix.  Prescription for physical therapy written today.  Trula Slade DPM

## 2019-06-06 ENCOUNTER — Other Ambulatory Visit: Payer: Self-pay

## 2019-06-06 ENCOUNTER — Encounter: Payer: Self-pay | Admitting: Internal Medicine

## 2019-06-06 ENCOUNTER — Ambulatory Visit (INDEPENDENT_AMBULATORY_CARE_PROVIDER_SITE_OTHER): Payer: Medicare Other | Admitting: Internal Medicine

## 2019-06-06 VITALS — BP 122/68 | HR 77 | Temp 98.4°F | Ht 63.0 in | Wt 183.0 lb

## 2019-06-06 DIAGNOSIS — E1165 Type 2 diabetes mellitus with hyperglycemia: Secondary | ICD-10-CM

## 2019-06-06 DIAGNOSIS — E1142 Type 2 diabetes mellitus with diabetic polyneuropathy: Secondary | ICD-10-CM | POA: Diagnosis not present

## 2019-06-06 DIAGNOSIS — Z794 Long term (current) use of insulin: Secondary | ICD-10-CM

## 2019-06-06 LAB — POCT GLYCOSYLATED HEMOGLOBIN (HGB A1C): Hemoglobin A1C: 8.4 % — AB (ref 4.0–5.6)

## 2019-06-06 MED ORDER — LANTUS SOLOSTAR 100 UNIT/ML ~~LOC~~ SOPN: 50.0000 [IU] | PEN_INJECTOR | Freq: Every day | SUBCUTANEOUS | 3 refills | Status: DC

## 2019-06-06 NOTE — Patient Instructions (Addendum)
-   Stop Ozempic  -Continue Lantus at 50 units daily  - Continue Humalog 10 units with Breakfast and 18 units with Lunch and supper - Correction Scale : Humalog    Blood sugar before meal Number of units to inject  Less than 175 0 unit  176 -  200 1 units  201 -  225 2 units  226-  250 3 units  251 -  275 4 units  276 -  300 5 units  301 -  325 6 units  326 -  350 7 units  351 -  375 8 units      Choose healthy, lower carb lower calorie snacks: toss salad, cooked vegetables, cottage cheese, peanut butter, low fat cheese / string cheese, lower sodium deli meat, tuna salad or chicken salad     HOW TO TREAT LOW BLOOD SUGARS (Blood sugar LESS THAN 70 MG/DL)  Please follow the RULE OF 15 for the treatment of hypoglycemia treatment (when your (blood sugars are less than 70 mg/dL)    STEP 1: Take 15 grams of carbohydrates when your blood sugar is low, which includes:   3-4 GLUCOSE TABS  OR  3-4 OZ OF JUICE OR REGULAR SODA OR  ONE TUBE OF GLUCOSE GEL     STEP 2: RECHECK blood sugar in 15 MINUTES STEP 3: If your blood sugar is still low at the 15 minute recheck --> then, go back to STEP 1 and treat AGAIN with another 15 grams of carbohydrates.

## 2019-06-06 NOTE — Progress Notes (Signed)
Name: Dana Dyer  Age/ Sex: 68 y.o., female   MRN/ DOB: [id number], 23-Feb-1951     PCP: Eulas Post, MD   Reason for Endocrinology Evaluation: Type 2 Diabetes Mellitus  Initial Endocrine Consultative Visit: 10/23/18    PATIENT IDENTIFIER: [name] is a 68 y.o. female with a past medical history of HTN, T2DM, Cirrhosis, Fibromyalgia and dyslipidemia . The patient has followed with Endocrinology clinic since 10/23/18 for consultative assistance with management of her diabetes.  DIABETIC HISTORY:  Dana Dyer was diagnosed with T2DM in 2005. She used to be on SU but does not recall intolerance. She is intolerant to victoza and invokana. She was started on insulin therapy in 2009. Her hemoglobin A1c has ranged from 7.8 % in 2012, peaking at 10.9% in 2018.   SUBJECTIVE:   During the last visit (02/06/2019): We continued Ozempic and humalog , we reduced lantus.    Today (06/06/2019): Dana Dyer is here for a 3 month follow up on diabetes management.  She checks her blood sugars 1 times daily. The patient has not had hypoglycemic episodes since the last clinic visit.  Otherwise, the patient has not required any recent emergency interventions for hypoglycemia and has not had recent hospitalizations secondary to hyper or hypoglycemic episodes.   She continues to have nausea with Ozempic and has not been taking it.     ROS: As per HPI and as detailed below: Review of Systems  HENT: Negative for congestion and sore throat.   Eyes: Negative for blurred vision and pain.  Respiratory: Negative for cough and shortness of breath.   Cardiovascular: Negative for chest pain and palpitations.  Gastrointestinal: Negative for abdominal pain, diarrhea and nausea.      HOME DIABETES REGIMEN:  Lantus 54 units daily- takes 50 units  Humalog 10 units with Breakfast  Humalog 18 units with lunch and  supper  Metformin 1000 mg BID  Ozempic 0.25 mg weekly - stopped due to nausea  Correctional Humalog : BG- 150/25     METER DOWNLOAD SUMMARY: Unable to download   Fasting <  150 mg/dL Evening 200-300 mg /dL      HISTORY:  Past Medical History:  Past Medical History:  Diagnosis Date  . ALLERGIC RHINITIS 10/09/2007  . Allergy   . Arthritis    hips, spine  . DIABETES MELLITUS, UNCONTROLLED 03/16/2009  . FATIGUE 10/21/2008  . FIBROMYALGIA 10/09/2007  . GERD (gastroesophageal reflux disease)    diet  controlled, no meds  . H/O vaginal hysterectomy   . HYPERLIPIDEMIA 10/09/2007   diet controlled, no meds  . HYPERTENSION 10/09/2007  . Morton's neuroma   . OBSTRUCTIVE SLEEP APNEA 10/09/2007  . Plantar fasciitis    History - right  . Sleep apnea    Uses CPAP  . TRANSAMINASES, SERUM, ELEVATED 07/08/2009   Past Surgical History:  Past Surgical History:  Procedure Laterality Date  . ABDOMINAL HYSTERECTOMY  1979   prolapsed uterus  . BASAL CELL CARCINOMA EXCISION    . BREAST BIOPSY     left 2012 stereo  . BREAST REDUCTION SURGERY Bilateral 1992  . COLONOSCOPY  2006   [facility]  . mamoplasty     reduction  . REDUCTION MAMMAPLASTY Bilateral   . TONSILLECTOMY  1959  . TUBAL LIGATION  1976    Social History:  reports that she has never smoked. She has never used smokeless tobacco. She reports that she does not drink alcohol or use drugs. Family History:  Family History  Problem Relation Age of Onset  . Diabetes Mother   . Arthritis Mother   . Cancer Mother        breast  . Breast cancer Mother 67  . Alcohol abuse Father   . Arthritis Father   . Hypertension Maternal Grandfather   . Miscarriages / Stillbirths Paternal Grandfather   . Breast cancer Maternal Aunt        diagnosed in her 75's  . Colon polyps Neg Hx   . Colon cancer Neg Hx   . Esophageal cancer Neg Hx   . Rectal cancer Neg Hx   . Stomach cancer Neg Hx      HOME MEDICATIONS: Allergies as of 06/06/2019       Reactions   Amoxicillin-pot Clavulanate    Severe diarrhea   Codeine Sulfate    REACTION: "hyper"   Crestor [rosuvastatin Calcium] Other (See Comments)   myalgia   Erythromycin Base    Invokana [canagliflozin] Other (See Comments)   Recurrent yeast infections   Irbesartan-hydrochlorothiazide    Latex    Victoza [liraglutide] Nausea Only      Medication List       Accurate as of June 06, 2019 12:10 PM. If you have any questions, ask your nurse or doctor.        amLODipine 5 MG tablet Commonly known as: NORVASC TAKE 1 TABLET BY MOUTH EVERY DAY   b complex vitamins capsule Take 1 capsule by mouth daily.   escitalopram 10 MG tablet Commonly known as: LEXAPRO Take 1 tablet (10 mg total) by mouth daily.   hydrochlorothiazide 25 MG tablet Commonly known as: HYDRODIURIL Take 1 tablet (25 mg total) by mouth daily.   insulin lispro 100 UNIT/ML KwikPen Commonly known as: HumaLOG KwikPen Inject 0.1 mLs (10 Units total) into the skin daily with breakfast AND 0.18 mLs (18 Units total) 2 (two) times daily before lunch and supper.   Insulin Pen Needle 32G X 4 MM Misc Commonly known as: BD Pen Needle Nano 2nd Gen 4x a day   Lantus SoloStar 100 UNIT/ML Solostar Pen Generic drug: Insulin Glargine Inject 50 Units into the skin at bedtime. Please disregard previous prescription   losartan 100 MG tablet Commonly known as: COZAAR Take 1 tablet (100 mg total) by mouth daily.   metFORMIN 1000 MG tablet Commonly known as: GLUCOPHAGE Take 1 tablet (1,000 mg total) by mouth 2 (two) times daily.   MISC NATURAL PRODUCTS PO Take by mouth.   nadolol 40 MG tablet Commonly known as: Corgard Take 1 tablet (40 mg total) by mouth daily.   NON FORMULARY [facility]  Anti-fungal-#1   OneTouch Delica Lancets 16R Misc CHECK 3 TIMES DAILY.   OneTouch Verio test strip Generic drug: glucose blood TEST 3 TIMES DAILY DX E11.9   Semaglutide(0.25 or 0.5MG/DOS) 2 MG/1.5ML Sopn  Commonly known as: Ozempic (0.25 or 0.5 MG/DOSE) Inject 0.25 mg into the skin once a week. What changed: additional instructions        OBJECTIVE:   Vital Signs: BP 122/68 (BP Location: Left Arm, Patient Position: Sitting, Cuff Size: Normal)   Pulse 77   Temp 98.4 F (36.9 C)   Ht 5' 3"  (1.6 m)   Wt 183 lb (83 kg)   SpO2 97%   BMI 32.42 kg/m   Wt Readings from Last 3 Encounters:  06/06/19 183 lb (83 kg)  02/06/19 187 lb 3.2 oz (84.9 kg)  01/30/19 186 lb 9.6 oz (84.6 kg)  Exam: General: Pt appears well and is in NAD  Lungs: Clear with good BS bilat with no rales, rhonchi, or wheezes  Heart: RRR with normal S1 and S2 and no gallops; no murmurs; no rub  Abdomen: Normoactive bowel sounds, soft, nontender, without masses or organomegaly palpable  Extremities: Trace pretibial edema.  Neuro: MS is good with appropriate affect, pt is alert and Ox3    DATA REVIEWED:  Lab Results  Component Value Date   HGBA1C 8.4 (A) 06/06/2019   HGBA1C 8.4 (A) 02/06/2019   HGBA1C 10.2 (A) 10/12/2018   Lab Results  Component Value Date   MICROALBUR <0.7 09/21/2016   LDLCALC 104 (H) 02/06/2018   CREATININE 0.60 11/23/2018     ASSESSMENT / PLAN / RECOMMENDATIONS:   1) Type 2 Diabetes Mellitus, poorly controlled, With neuropathic complications - Most recent A1c of 8.4 %. Goal A1c < 7.0 %.   Plan:  -Her A1c has been steady at 8.4% , this is due to post-prandial hyperglycemia, either because she is not always taking prandial insulin with her meals or due to the fact that she snacks outside her meals.  - Also, when she checks once a day , she is missing on multiple opportunities during the day that she is not using her correction scale.  - We again emphasized that her fasting BG have been perfect, below 150 mg/dL, no need to adjust lantus,  but the rare occasions when she checks during the day , her BG's have been noted to be > 200 mg/dL which is due to lack of prandial insulin.  - We  will stop the Ozempic since she continues with nausea.     MEDICATIONS: - Continue  Lantus at 50 units daily - Continue Humalog 10 units with breakfast, 18 units with Lunch and Supper  - Stop Ozempic    Humalog correctional insulin: ADD extra units on insulin to your meal-time  dose if your blood sugars are higher than 175. Use the scale below to help guide you:   Blood sugar before meal Number of units to inject  Less than 175 0 unit  176 -  200 1 units  201 -  225 2 units  226-  250 3 units  251 -  275 4 units  276 -  300 5 units  301 -  325 6 units  326 -  350 7 units  351 -  375 8 units    EDUCATION / INSTRUCTIONS:  BG monitoring instructions: Patient is instructed to check her blood sugars 4 times a day, before meals and bedtime.  Call [facility] if: BG persistently < 70 or > 300. . I reviewed the Rule of 15 for the treatment of hypoglycemia in detail with the patient. Literature supplied.    F/U in 12 weeks    Signed electronically by: Mack Guise, MD  [facility]  [address] [place], [place] [address] Phone: [phone] FAX: [phone]   CC: Eulas Post, [place] [address] Phone: [phone]  Fax: [phone]  Return to Endocrinology clinic as below: Future Appointments  Date Time Provider [place]  06/27/2019  1:50 PM [facility] MM 3 [facility]  07/05/2019 10:45 AM Burchette, Alinda Sierras, MD [facility]  09/06/2019  9:10 AM , Melanie Crazier, MD [facility] None  12/16/2019  8:15 AM Trula Slade, DPM [facility]

## 2019-06-18 ENCOUNTER — Other Ambulatory Visit: Payer: Self-pay | Admitting: Family Medicine

## 2019-06-18 NOTE — Telephone Encounter (Signed)
Yes  We referred to endo for diabetes management.

## 2019-06-18 NOTE — Telephone Encounter (Signed)
I see that the patient is seeing Dr. Kelton Pillar now at Endo, should I defer diabetic supplies to Endo to refill now?

## 2019-06-24 ENCOUNTER — Other Ambulatory Visit: Payer: Self-pay | Admitting: Internal Medicine

## 2019-06-25 ENCOUNTER — Other Ambulatory Visit: Payer: Self-pay

## 2019-06-25 MED ORDER — BD PEN NEEDLE NANO 2ND GEN 32G X 4 MM MISC: 3 refills | Status: DC

## 2019-06-27 ENCOUNTER — Ambulatory Visit: Payer: Medicare Other

## 2019-07-03 ENCOUNTER — Other Ambulatory Visit: Payer: Self-pay

## 2019-07-03 ENCOUNTER — Ambulatory Visit
Admission: RE | Admit: 2019-07-03 | Discharge: 2019-07-03 | Disposition: A | Discharge status: Home / Self Care | Payer: Medicare Other | Source: Ambulatory Visit | Attending: Family Medicine | Admitting: Family Medicine

## 2019-07-03 DIAGNOSIS — Z1231 Encounter for screening mammogram for malignant neoplasm of breast: Secondary | ICD-10-CM

## 2019-07-05 ENCOUNTER — Other Ambulatory Visit: Payer: Self-pay

## 2019-07-05 ENCOUNTER — Encounter: Payer: Self-pay | Admitting: Family Medicine

## 2019-07-05 ENCOUNTER — Ambulatory Visit (INDEPENDENT_AMBULATORY_CARE_PROVIDER_SITE_OTHER): Payer: Medicare Other | Admitting: Family Medicine

## 2019-07-05 VITALS — BP 118/76 | HR 71 | Temp 98.2°F | Ht 62.0 in | Wt 183.7 lb

## 2019-07-05 DIAGNOSIS — Z0000 Encounter for general adult medical examination without abnormal findings: Secondary | ICD-10-CM

## 2019-07-05 DIAGNOSIS — E538 Deficiency of other specified B group vitamins: Secondary | ICD-10-CM

## 2019-07-05 LAB — CBC WITH DIFFERENTIAL/PLATELET
Basophils Absolute: 0 10*3/uL (ref 0.0–0.1)
Basophils Relative: 0.5 % (ref 0.0–3.0)
Eosinophils Absolute: 0.1 10*3/uL (ref 0.0–0.7)
Eosinophils Relative: 3.2 % (ref 0.0–5.0)
HCT: 30.1 % — ABNORMAL LOW (ref 36.0–46.0)
Hemoglobin: 9.6 g/dL — ABNORMAL LOW (ref 12.0–15.0)
Lymphocytes Relative: 28.7 % (ref 12.0–46.0)
Lymphs Abs: 1.3 10*3/uL (ref 0.7–4.0)
MCHC: 32 g/dL (ref 30.0–36.0)
MCV: 75.9 fl — ABNORMAL LOW (ref 78.0–100.0)
Monocytes Absolute: 0.5 10*3/uL (ref 0.1–1.0)
Monocytes Relative: 10.3 % (ref 3.0–12.0)
Neutro Abs: 2.6 10*3/uL (ref 1.4–7.7)
Neutrophils Relative %: 57.3 % (ref 43.0–77.0)
Platelets: 101 10*3/uL — ABNORMAL LOW (ref 150.0–400.0)
RBC: 3.97 Mil/uL (ref 3.87–5.11)
RDW: 15.9 % — ABNORMAL HIGH (ref 11.5–15.5)
WBC: 4.6 10*3/uL (ref 4.0–10.5)

## 2019-07-05 LAB — HEPATIC FUNCTION PANEL
ALT: 37 U/L — ABNORMAL HIGH (ref 0–35)
AST: 42 U/L — ABNORMAL HIGH (ref 0–37)
Albumin: 3.7 g/dL (ref 3.5–5.2)
Alkaline Phosphatase: 85 U/L (ref 39–117)
Bilirubin, Direct: 0.3 mg/dL (ref 0.0–0.3)
Total Bilirubin: 1.3 mg/dL — ABNORMAL HIGH (ref 0.2–1.2)
Total Protein: 6.6 g/dL (ref 6.0–8.3)

## 2019-07-05 LAB — BASIC METABOLIC PANEL
BUN: 15 mg/dL (ref 6–23)
CO2: 32 mEq/L (ref 19–32)
Calcium: 9.3 mg/dL (ref 8.4–10.5)
Chloride: 96 mEq/L (ref 96–112)
Creatinine, Ser: 0.73 mg/dL (ref 0.40–1.20)
GFR: 78.84 mL/min (ref >60.00)
Glucose, Bld: 343 mg/dL — ABNORMAL HIGH (ref 70–99)
Potassium: 3.7 mEq/L (ref 3.5–5.1)
Sodium: 135 mEq/L (ref 135–145)

## 2019-07-05 LAB — LIPID PANEL
Cholesterol: 226 mg/dL — ABNORMAL HIGH (ref 0–200)
HDL: 56.5 mg/dL (ref >39.00)
LDL Cholesterol: 143 mg/dL — ABNORMAL HIGH (ref 0–99)
NonHDL: 169.59
Total CHOL/HDL Ratio: 4
Triglycerides: 133 mg/dL (ref 0.0–149.0)
VLDL: 26.6 mg/dL (ref 0.0–40.0)

## 2019-07-05 LAB — VITAMIN B12: Vitamin B-12: 325 pg/mL (ref 211–911)

## 2019-07-05 LAB — TSH: TSH: 1.48 u[IU]/mL (ref 0.35–4.50)

## 2019-07-05 NOTE — Progress Notes (Signed)
Subjective:     Patient ID: Dana Dyer, female   DOB: February 11, 1951, 68 y.o.   MRN: [id number]  HPI Patient is here today for physical exam.  Her chronic problems include history of obesity, hypertension, obstructive sleep apnea, type 2 diabetes, nonalcoholic fatty liver disease with cirrhosis, fibromyalgia, dyslipidemia, history of depression.  She states she feels very well at this time.  She has had close follow-up with gastroenterology regarding her liver.  She is followed by endocrinology regarding her type 2 diabetes.  Recently placed on Ozempic but could not tolerate apparently secondary to side effects  The following health maintenance reviewed:  -Gets yearly flu vaccine -Pneumonia vaccines complete -Last DEXA scan 2014 but was excellent -She gets yearly mammograms and just had one 2 days ago -Colonoscopy January 2020 with a recommended 3-year follow-up -Tetanus due 2023  Past Medical History:  Diagnosis Date  . ALLERGIC RHINITIS 10/09/2007  . Allergy   . Arthritis    hips, spine  . DIABETES MELLITUS, UNCONTROLLED 03/16/2009  . FATIGUE 10/21/2008  . FIBROMYALGIA 10/09/2007  . GERD (gastroesophageal reflux disease)    diet  controlled, no meds  . H/O vaginal hysterectomy   . HYPERLIPIDEMIA 10/09/2007   diet controlled, no meds  . HYPERTENSION 10/09/2007  . Morton's neuroma   . OBSTRUCTIVE SLEEP APNEA 10/09/2007  . Plantar fasciitis    History - right  . Sleep apnea    Uses CPAP  . TRANSAMINASES, SERUM, ELEVATED 07/08/2009   Past Surgical History:  Procedure Laterality Date  . ABDOMINAL HYSTERECTOMY  1979   prolapsed uterus  . BASAL CELL CARCINOMA EXCISION    . BREAST BIOPSY     left 2012 stereo  . BREAST REDUCTION SURGERY Bilateral 1992  . COLONOSCOPY  2006   [facility]  . mamoplasty     reduction  . REDUCTION MAMMAPLASTY Bilateral   . TONSILLECTOMY  1959  . TUBAL LIGATION  1976    reports that she has never smoked. She has never used smokeless tobacco. She  reports that she does not drink alcohol or use drugs. family history includes Alcohol abuse in her father; Arthritis in her father and mother; Breast cancer in her maternal aunt; Breast cancer (age of onset: 66) in her mother; Cancer in her mother; Diabetes in her mother; Hypertension in her maternal grandfather; Miscarriages / [place] in her paternal grandfather. Allergies  Allergen Reactions  . Amoxicillin-Pot Clavulanate     Severe diarrhea  . Codeine Sulfate     REACTION: "hyper"  . Crestor [Rosuvastatin Calcium] Other (See Comments)    myalgia  . Erythromycin Base   . Invokana [Canagliflozin] Other (See Comments)    Recurrent yeast infections  . Irbesartan-Hydrochlorothiazide   . Latex   . Victoza [Liraglutide] Nausea Only     Review of Systems  Constitutional: Negative for activity change, appetite change, fatigue, fever and unexpected weight change.  HENT: Negative for ear pain, hearing loss, sore throat and trouble swallowing.   Eyes: Negative for visual disturbance.  Respiratory: Negative for cough and shortness of breath.   Cardiovascular: Negative for chest pain and palpitations.  Gastrointestinal: Negative for abdominal pain, blood in stool, constipation and diarrhea.  Endocrine: Negative for polydipsia and polyuria.  Genitourinary: Negative for dysuria and hematuria.  Musculoskeletal: Negative for arthralgias, back pain and myalgias.  Skin: Negative for rash.  Neurological: Negative for dizziness, syncope, weakness and headaches.  Hematological: Negative for adenopathy.  Psychiatric/Behavioral: Negative for confusion and dysphoric mood.  Objective:   Physical Exam Constitutional:      Appearance: She is well-developed.  HENT:     Head: Normocephalic and atraumatic.  Eyes:     Pupils: Pupils are equal, round, and reactive to light.  Neck:     Musculoskeletal: Normal range of motion and neck supple.     Thyroid: No thyromegaly.  Cardiovascular:      Rate and Rhythm: Normal rate and regular rhythm.     Heart sounds: Normal heart sounds.     Comments: She does have somewhat faint murmur heard at the left sternal border.  Did not change with position change. Pulmonary:     Effort: No respiratory distress.     Breath sounds: Normal breath sounds. No wheezing or rales.  Abdominal:     General: Bowel sounds are normal. There is no distension.     Palpations: Abdomen is soft. There is no mass.     Tenderness: There is no abdominal tenderness. There is no guarding or rebound.  Musculoskeletal: Normal range of motion.  Lymphadenopathy:     Cervical: No cervical adenopathy.  Skin:    Findings: No rash.  Neurological:     Mental Status: She is alert and oriented to person, place, and time.     Cranial Nerves: No cranial nerve deficit.     Deep Tendon Reflexes: Reflexes normal.  Psychiatric:        Behavior: Behavior normal.        Thought Content: Thought content normal.        Judgment: Judgment normal.        Assessment:     Complete physical.  We reviewed the following health maintenance issues.  She does have faint heart murmur heard left sternal border.  Basically asymptomatic.  Follow-up in 6 months and re-auscultate.  Consider future echocardiogram at some point to further evaluate    Plan:     -Continue yearly flu vaccine -Discussed Shingrix vaccine.  She has had previous Zostavax.  She will consider -Obtain follow-up labs.  She has had B12 deficiency so we are checking B12 levels as well -Multiple other health maintenance studies up-to-date as above -She is encouraged to lose some weight  Eulas Post MD [facility]

## 2019-07-05 NOTE — Patient Instructions (Signed)
Preventive Care 70 Years and Older, Female Preventive care refers to lifestyle choices and visits with your health care provider that can promote health and wellness. This includes:  A yearly physical exam. This is also called an annual well check.  Regular dental and eye exams.  Immunizations.  Screening for certain conditions.  Healthy lifestyle choices, such as diet and exercise. What can I expect for my preventive care visit? Physical exam Your health care provider will check:  Height and weight. These may be used to calculate body mass index (BMI), which is a measurement that tells if you are at a healthy weight.  Heart rate and blood pressure.  Your skin for abnormal spots. Counseling Your health care provider may ask you questions about:  Alcohol, tobacco, and drug use.  Emotional well-being.  Home and relationship well-being.  Sexual activity.  Eating habits.  History of falls.  Memory and ability to understand (cognition).  Work and work Statistician.  Pregnancy and menstrual history. What immunizations do I need?  Influenza (flu) vaccine  This is recommended every year. Tetanus, diphtheria, and pertussis (Tdap) vaccine  You may need a Td booster every 10 years. Varicella (chickenpox) vaccine  You may need this vaccine if you have not already been vaccinated. Zoster (shingles) vaccine  You may need this after age 70. Pneumococcal conjugate (PCV13) vaccine  One dose is recommended after age 70. Pneumococcal polysaccharide (PPSV23) vaccine  One dose is recommended after age 70. Measles, mumps, and rubella (MMR) vaccine  You may need at least one dose of MMR if you were born in 1957 or later. You may also need a second dose. Meningococcal conjugate (MenACWY) vaccine  You may need this if you have certain conditions. Hepatitis A vaccine  You may need this if you have certain conditions or if you travel or work in places where you may be exposed  to hepatitis A. Hepatitis B vaccine  You may need this if you have certain conditions or if you travel or work in places where you may be exposed to hepatitis B. Haemophilus influenzae type b (Hib) vaccine  You may need this if you have certain conditions. You may receive vaccines as individual doses or as more than one vaccine together in one shot (combination vaccines). Talk with your health care provider about the risks and benefits of combination vaccines. What tests do I need? Blood tests  Lipid and cholesterol levels. These may be checked every 5 years, or more frequently depending on your overall health.  Hepatitis C test.  Hepatitis B test. Screening  Lung cancer screening. You may have this screening every year starting at age 70 if you have a 30-pack-year history of smoking and currently smoke or have quit within the past 15 years.  Colorectal cancer screening. All adults should have this screening starting at age 70 and continuing until age 15. Your health care provider may recommend screening at age 70 if you are at increased risk. You will have tests every 1-10 years, depending on your results and the type of screening test.  Diabetes screening. This is done by checking your blood sugar (glucose) after you have not eaten for a while (fasting). You may have this done every 1-3 years.  Mammogram. This may be done every 1-2 years. Talk with your health care provider about how often you should have regular mammograms.  BRCA-related cancer screening. This may be done if you have a family history of breast, ovarian, tubal, or peritoneal cancers.  Other tests  Sexually transmitted disease (STD) testing.  Bone density scan. This is done to screen for osteoporosis. You may have this done starting at age 76. Follow these instructions at home: Eating and drinking  Eat a diet that includes fresh fruits and vegetables, whole grains, lean protein, and low-fat dairy products. Limit  your intake of foods with high amounts of sugar, saturated fats, and salt.  Take vitamin and mineral supplements as recommended by your health care provider.  Do not drink alcohol if your health care provider tells you not to drink.  If you drink alcohol: ? Limit how much you have to 0-1 drink a day. ? Be aware of how much alcohol is in your drink. In the [place] one drink equals one 12 oz bottle of beer (355 mL), one 5 oz glass of wine (148 mL), or one 1 oz glass of hard liquor (44 mL). Lifestyle  Take daily care of your teeth and gums.  Stay active. Exercise for at least 30 minutes on 5 or more days each week.  Do not use any products that contain nicotine or tobacco, such as cigarettes, e-cigarettes, and chewing tobacco. If you need help quitting, ask your health care provider.  If you are sexually active, practice safe sex. Use a condom or other form of protection in order to prevent STIs (sexually transmitted infections).  Talk with your health care provider about taking a low-dose aspirin or statin. What's next?  Go to your health care provider once a year for a well check visit.  Ask your health care provider how often you should have your eyes and teeth checked.  Stay up to date on all vaccines. This information is not intended to replace advice given to you by your health care provider. Make sure you discuss any questions you have with your health care provider. Document Released: 12/18/2015 Document Revised: 11/15/2018 Document Reviewed: 11/15/2018 Elsevier Patient Education  2020 [employer].

## 2019-07-08 ENCOUNTER — Encounter: Payer: Self-pay | Admitting: Family Medicine

## 2019-07-09 ENCOUNTER — Other Ambulatory Visit: Payer: Self-pay

## 2019-07-09 DIAGNOSIS — E785 Hyperlipidemia, unspecified: Secondary | ICD-10-CM

## 2019-07-09 MED ORDER — PRAVASTATIN SODIUM 20 MG PO TABS: 20.0000 mg | ORAL_TABLET | Freq: Every day | ORAL | 1 refills | Status: DC

## 2019-07-22 ENCOUNTER — Other Ambulatory Visit: Payer: Self-pay | Admitting: Gastroenterology

## 2019-07-24 ENCOUNTER — Encounter: Payer: Self-pay | Admitting: Gastroenterology

## 2019-07-24 ENCOUNTER — Other Ambulatory Visit (INDEPENDENT_AMBULATORY_CARE_PROVIDER_SITE_OTHER): Payer: Medicare Other

## 2019-07-24 ENCOUNTER — Ambulatory Visit (INDEPENDENT_AMBULATORY_CARE_PROVIDER_SITE_OTHER): Payer: Medicare Other | Admitting: Gastroenterology

## 2019-07-24 VITALS — BP 110/68 | HR 72 | Temp 98.4°F | Ht 62.0 in | Wt 182.4 lb

## 2019-07-24 DIAGNOSIS — K7460 Unspecified cirrhosis of liver: Secondary | ICD-10-CM

## 2019-07-24 LAB — PROTIME-INR
INR: 1.2 ratio — ABNORMAL HIGH (ref 0.8–1.0)
Prothrombin Time: 13.6 s — ABNORMAL HIGH (ref 9.6–13.1)

## 2019-07-24 NOTE — Patient Instructions (Signed)
Your provider has requested that you go to the basement level for lab work before leaving today. Press "B" on the elevator. The lab is located at the first door on the left as you exit the elevator.  You have been scheduled for an abdominal ultrasound at [facility] (1st floor of hospital) on 07/26/19 at 810am. Please arrive 15 minutes prior to your appointment for registration. Make certain not to have anything to eat or drink 6 hours prior to your appointment. Should you need to reschedule your appointment, please contact radiology at [phone]. This test typically takes about 30 minutes to perform.   Please purchase the following medications over the counter and take as directed: Iron tab daily  Your PCP will add a CBC in 6 weeks  We will contact you for a follow up appointment in 6 months   Thank you for entrusting me with your care and choosing [facility].  Dr Ardis Hughs

## 2019-07-24 NOTE — Progress Notes (Signed)
Review of pertinent gastrointestinal problems: 1.History of adenomatous polyps in her colon.Colonoscopy 2006 Dr. Timmothy Euler found no polyps. Colonoscopy Dr. Ardis Hughs December 2016 found 7 subcentimeter polyps. These were all adenomatous polyps on pathology. Also documented left-sided diverticulosis. I recommend she have a repeat colonoscopy at 3-year interval.   Colonoscopy January 2020 four polyps were removed from her colon, all were adenomatous on pathology.  She was recommended to have repeat colonoscopy at 3-year interval 2. Cirrhosis, likely from longstanding fatty liver disease, out-of-control diabetes.  Diagnosed 2019.  Lab workup 2019: Hep C Ab negative, Hep B Surface Ag negative, hepatitis B surface antibody negative, hep B Core Igm negative, Hep A IgM negative. Ferritin normal, HIV negative, ANA negative, antimitochondrial antibody negative, hepatitis a antibody total negative; slightly low platelets, transaminases slightly elevated.  No edema.  [place] 07/2018: slightly nodular, suspicious for cirrhosis.  EGD 12/2018 small distal esophagus varices, mild portal gastropathy, no gastric varices.  She was started on nadolol (eventually at 58m daily)  AFP 11/2018 4.4  MELD 7 (11/2018 labs)  Hepatitis A, B vaccination series started 11/2018   HPI: This is a very pleasant 68year old woman whom I last saw about 6 months ago.  I last saw her 6 months ago.  She was doing very well and had a low overall meld score.  She had some epistaxis around that time and her hemoglobin had dropped to 10.7 I recommended she start taking an iron supplement once daily.  She was to have a repeat CBC shortly after that.  She never started that iron supplement because she said in the past iron has caused a lot of constipation.  She has had no overt GI bleeding.  The nosebleeds stopped as well.  She is very content with a new enzyme which she is taking over-the-counter 3 times daily called "now".  This results in  very nice bowel regularity for her.  Her weight has been overall very stable  Blood work July 2020 total bilirubin 1.3, AST 42, ALT 37, creatinine 0.7, glucose 343, sodium 135, hemoglobin 9.6, MCV 76 RDW 16, platelets 101  Chief complaint is cirrhosis  ROS: complete GI ROS as described in HPI, all other review negative.  Constitutional:  No unintentional weight loss   Past Medical History:  Diagnosis Date  . ALLERGIC RHINITIS 10/09/2007  . Allergy   . Arthritis    hips, spine  . DIABETES MELLITUS, UNCONTROLLED 03/16/2009  . FATIGUE 10/21/2008  . FIBROMYALGIA 10/09/2007  . GERD (gastroesophageal reflux disease)    diet  controlled, no meds  . H/O vaginal hysterectomy   . HYPERLIPIDEMIA 10/09/2007   diet controlled, no meds  . HYPERTENSION 10/09/2007  . Morton's neuroma   . OBSTRUCTIVE SLEEP APNEA 10/09/2007  . Plantar fasciitis    History - right  . Sleep apnea    Uses CPAP  . TRANSAMINASES, SERUM, ELEVATED 07/08/2009    Past Surgical History:  Procedure Laterality Date  . ABDOMINAL HYSTERECTOMY  1979   prolapsed uterus  . BASAL CELL CARCINOMA EXCISION    . BREAST BIOPSY     left 2012 stereo  . BREAST REDUCTION SURGERY Bilateral 1992  . COLONOSCOPY  2006   [facility]  . mamoplasty     reduction  . REDUCTION MAMMAPLASTY Bilateral   . TONSILLECTOMY  1959  . TUBAL LIGATION  1976    Current Outpatient Medications  Medication Sig Dispense Refill  . amLODipine (NORVASC) 5 MG tablet TAKE 1 TABLET BY MOUTH EVERY DAY 90 tablet  0  . b complex vitamins capsule Take 1 capsule by mouth daily.    Marland Kitchen escitalopram (LEXAPRO) 10 MG tablet Take 1 tablet (10 mg total) by mouth daily. 90 tablet 0  . hydrochlorothiazide (HYDRODIURIL) 25 MG tablet Take 1 tablet (25 mg total) by mouth daily. 90 tablet 3  . Insulin Glargine (LANTUS SOLOSTAR) 100 UNIT/ML Solostar Pen Inject 50 Units into the skin at bedtime. Please disregard previous prescription 15 mL 3  . insulin lispro (HUMALOG KWIKPEN) 100  UNIT/ML KwikPen Inject 0.1 mLs (10 Units total) into the skin daily with breakfast AND 0.18 mLs (18 Units total) 2 (two) times daily before lunch and supper. 13 pen 3  . Insulin Pen Needle (BD PEN NEEDLE NANO 2ND GEN) 32G X 4 MM MISC 4x a day 360 each 3  . Lancets (ONETOUCH DELICA PLUS HALPFX90W) MISC CHECK 3 TIMES DAILY. 300 each 0  . losartan (COZAAR) 100 MG tablet Take 1 tablet (100 mg total) by mouth daily. 90 tablet 3  . metFORMIN (GLUCOPHAGE) 1000 MG tablet Take 1 tablet (1,000 mg total) by mouth 2 (two) times daily. 180 tablet 3  . MISC NATURAL PRODUCTS PO Take by mouth.    . nadolol (CORGARD) 40 MG tablet TAKE 1 TABLET BY MOUTH EVERY DAY 90 tablet 0  . neomycin-polymyxin b-dexamethasone (MAXITROL) 3.5-10000-0.1 OINT APPLY TO LOWER RIGHT EYELID MARGIN TWICE DAILY FOR 2 WEEKS    . ONETOUCH VERIO test strip TEST 3 TIMES DAILY DX E11.9 300 each 1  . pravastatin (PRAVACHOL) 20 MG tablet Take 1 tablet (20 mg total) by mouth daily. 90 tablet 1  . moxifloxacin (VIGAMOX) 0.5 % ophthalmic solution PLACE 1 DROP IN THE RIGHT EYE 3 TIMES DAILY    . [facility]  Anti-fungal-#1    . Semaglutide,0.25 or 0.5MG/DOS, (OZEMPIC, 0.25 OR 0.5 MG/DOSE,) 2 MG/1.5ML SOPN Inject 0.25 mg into the skin once a week. (Patient taking differently: Inject 0.25 mg into the skin once a week. Every 10 days due to nausea) 2 pen 3   Current Facility-Administered Medications  Medication Dose Route Frequency Provider Last Rate Last Dose  . TDaP (BOOSTRIX) injection 0.5 mL  0.5 mL Intramuscular Once Eulas Post, MD        Allergies as of 07/24/2019 - Review Complete 07/24/2019  Allergen Reaction Noted  . Amoxicillin-pot clavulanate  03/09/2012  . Codeine sulfate  03/16/2009  . Crestor [rosuvastatin calcium] Other (See Comments) 03/10/2016  . Erythromycin base  04/07/2009  . Invokana [canagliflozin] Other (See Comments) 05/18/2015  . Irbesartan-hydrochlorothiazide  04/07/2009  . Latex    .  Victoza [liraglutide] Nausea Only 03/10/2016    Family History  Problem Relation Age of Onset  . Diabetes Mother   . Arthritis Mother   . Cancer Mother        breast  . Breast cancer Mother 21  . Alcohol abuse Father   . Arthritis Father   . Hypertension Maternal Grandfather   . Miscarriages / Stillbirths Paternal Grandfather   . Breast cancer Maternal Aunt        diagnosed in her 90's  . Colon polyps Neg Hx   . Colon cancer Neg Hx   . Esophageal cancer Neg Hx   . Rectal cancer Neg Hx   . Stomach cancer Neg Hx     Social History   Socioeconomic History  . Marital status: Married    Spouse name: Not on file  . Number of children: Not on file  .  Years of education: Not on file  . Highest education level: Not on file  Occupational History  . Not on file  Social Needs  . Financial resource strain: Not on file  . Food insecurity    Worry: Not on file    Inability: Not on file  . Transportation needs    Medical: Not on file    Non-medical: Not on file  Tobacco Use  . Smoking status: Never Smoker  . Smokeless tobacco: Never Used  Substance and Sexual Activity  . Alcohol use: No    Alcohol/week: 0.0 standard drinks  . Drug use: No  . Sexual activity: Yes    Birth control/protection: Other-see comments, Post-menopausal    Comment: Hysterectomy  Lifestyle  . Physical activity    Days per week: Not on file    Minutes per session: Not on file  . Stress: Not on file  Relationships  . Social Herbalist on phone: Not on file    Gets together: Not on file    Attends religious service: Not on file    Active member of club or organization: Not on file    Attends meetings of clubs or organizations: Not on file    Relationship status: Not on file  . Intimate partner violence    Fear of current or ex partner: Not on file    Emotionally abused: Not on file    Physically abused: Not on file    Forced sexual activity: Not on file  Other Topics Concern  . Not on  file  Social History Narrative  . Not on file     Physical Exam: BP 110/68   Pulse 72   Temp 98.4 F (36.9 C) (Oral)   Ht 5' 2"  (1.575 m)   Wt 182 lb 6.4 oz (82.7 kg)   BMI 33.36 kg/m  Constitutional: generally well-appearing Psychiatric: alert and oriented x3 Abdomen: soft, nontender, nondistended, no obvious ascites, no peritoneal signs, normal bowel sounds No peripheral edema noted in lower extremities  Assessment and plan: 68 y.o. female with cirrhosis, low meld  She is doing very well overall.  No signs of encephalopathy, no overt GI bleeding, no trouble with edema or fluid overload.  She does have slightly worsening anemia, hemoglobin last month was 9.6 with a low MCV.  There is a very good chance that this is related to her portal gastropathy.  Several months ago I had recommended that she start taking iron for her low blood counts but she declined.  She and I talked about this more at length today and I tried to impress upon her the rationale of taking an iron supplement.  She will try again.  She knows that if once daily over-the-counter iron disagrees with her and she will try to take at least every other day.  Hopefully now that she is on a better bowel regimen she will not have the constipation difficulty when she restarts it.  She needs hepatoma screening with alpha-fetoprotein and ultrasound.  She is coags to round out meld score calculation.  She will have a repeat CBC in 6 or 7 weeks sent to me and if all is going well I would like to see her again in 6 months.  Please see the "Patient Instructions" section for addition details about the plan.  Owens Loffler, MD [facility] 07/24/2019, 2:03 PM

## 2019-07-25 LAB — AFP TUMOR MARKER: AFP-Tumor Marker: 4.2 ng/mL

## 2019-07-26 ENCOUNTER — Ambulatory Visit
Admission: RE | Admit: 2019-07-26 | Discharge: 2019-07-26 | Disposition: A | Discharge status: Home / Self Care | Payer: Medicare Other | Source: Ambulatory Visit | Attending: Gastroenterology | Admitting: Gastroenterology

## 2019-07-26 DIAGNOSIS — K7460 Unspecified cirrhosis of liver: Secondary | ICD-10-CM

## 2019-07-29 ENCOUNTER — Other Ambulatory Visit: Payer: Self-pay

## 2019-07-29 DIAGNOSIS — D649 Anemia, unspecified: Secondary | ICD-10-CM

## 2019-08-16 ENCOUNTER — Telehealth: Payer: Self-pay

## 2019-08-16 NOTE — Telephone Encounter (Signed)
Left message on machine to call back  

## 2019-08-16 NOTE — Telephone Encounter (Signed)
-----   Message from Timothy Lasso, RN sent at 02/13/2019 11:32 AM EDT ----- repeat ultrasound for hepatoma screening as well as alpha-fetoprotein level, CBC, complete metabolic profile and INR; all of these in 6 months

## 2019-08-19 NOTE — Telephone Encounter (Signed)
The pt has had [place] and labs completed and have been reviewed.

## 2019-08-21 ENCOUNTER — Other Ambulatory Visit: Payer: Self-pay | Admitting: Family Medicine

## 2019-08-26 LAB — HM DIABETES EYE EXAM

## 2019-08-27 ENCOUNTER — Other Ambulatory Visit: Payer: Self-pay

## 2019-08-27 ENCOUNTER — Other Ambulatory Visit (INDEPENDENT_AMBULATORY_CARE_PROVIDER_SITE_OTHER): Payer: Medicare Other

## 2019-08-27 DIAGNOSIS — Z23 Encounter for immunization: Secondary | ICD-10-CM | POA: Diagnosis not present

## 2019-08-27 DIAGNOSIS — D649 Anemia, unspecified: Secondary | ICD-10-CM | POA: Diagnosis not present

## 2019-08-27 DIAGNOSIS — E785 Hyperlipidemia, unspecified: Secondary | ICD-10-CM

## 2019-08-27 LAB — CBC WITH DIFFERENTIAL/PLATELET
Basophils Absolute: 0 10*3/uL (ref 0.0–0.1)
Basophils Relative: 0.5 % (ref 0.0–3.0)
Eosinophils Absolute: 0.4 10*3/uL (ref 0.0–0.7)
Eosinophils Relative: 6.3 % — ABNORMAL HIGH (ref 0.0–5.0)
HCT: 34.5 % — ABNORMAL LOW (ref 36.0–46.0)
Hemoglobin: 11.1 g/dL — ABNORMAL LOW (ref 12.0–15.0)
Lymphocytes Relative: 30.4 % (ref 12.0–46.0)
Lymphs Abs: 1.8 10*3/uL (ref 0.7–4.0)
MCHC: 32.2 g/dL (ref 30.0–36.0)
MCV: 77.8 fl — ABNORMAL LOW (ref 78.0–100.0)
Monocytes Absolute: 0.7 10*3/uL (ref 0.1–1.0)
Monocytes Relative: 11.5 % (ref 3.0–12.0)
Neutro Abs: 3.1 10*3/uL (ref 1.4–7.7)
Neutrophils Relative %: 51.3 % (ref 43.0–77.0)
Platelets: 114 10*3/uL — ABNORMAL LOW (ref 150.0–400.0)
RBC: 4.43 Mil/uL (ref 3.87–5.11)
RDW: 21 % — ABNORMAL HIGH (ref 11.5–15.5)
WBC: 6 10*3/uL (ref 4.0–10.5)

## 2019-08-27 LAB — HEPATIC FUNCTION PANEL
ALT: 39 U/L — ABNORMAL HIGH (ref 0–35)
AST: 46 U/L — ABNORMAL HIGH (ref 0–37)
Albumin: 3.8 g/dL (ref 3.5–5.2)
Alkaline Phosphatase: 92 U/L (ref 39–117)
Bilirubin, Direct: 0.2 mg/dL (ref 0.0–0.3)
Total Bilirubin: 1.1 mg/dL (ref 0.2–1.2)
Total Protein: 6.8 g/dL (ref 6.0–8.3)

## 2019-08-27 LAB — LIPID PANEL
Cholesterol: 204 mg/dL — ABNORMAL HIGH (ref 0–200)
HDL: 59.5 mg/dL (ref >39.00)
LDL Cholesterol: 126 mg/dL — ABNORMAL HIGH (ref 0–99)
NonHDL: 144.88
Total CHOL/HDL Ratio: 3
Triglycerides: 94 mg/dL (ref 0.0–149.0)
VLDL: 18.8 mg/dL (ref 0.0–40.0)

## 2019-08-28 ENCOUNTER — Other Ambulatory Visit: Payer: Self-pay | Admitting: Family Medicine

## 2019-08-28 MED ORDER — PRAVASTATIN SODIUM 40 MG PO TABS: 40.0000 mg | ORAL_TABLET | Freq: Every day | ORAL | 0 refills | Status: DC

## 2019-08-31 ENCOUNTER — Ambulatory Visit: Payer: Medicare Other

## 2019-09-05 ENCOUNTER — Other Ambulatory Visit: Payer: Self-pay

## 2019-09-06 ENCOUNTER — Ambulatory Visit: Payer: Medicare Other | Admitting: Internal Medicine

## 2019-09-09 ENCOUNTER — Other Ambulatory Visit: Payer: Self-pay

## 2019-09-09 ENCOUNTER — Encounter: Payer: Self-pay | Admitting: Internal Medicine

## 2019-09-09 ENCOUNTER — Ambulatory Visit (INDEPENDENT_AMBULATORY_CARE_PROVIDER_SITE_OTHER): Payer: Medicare Other | Admitting: Internal Medicine

## 2019-09-09 VITALS — BP 124/58 | HR 76 | Temp 98.3°F | Ht 62.0 in | Wt 185.0 lb

## 2019-09-09 DIAGNOSIS — E1165 Type 2 diabetes mellitus with hyperglycemia: Secondary | ICD-10-CM | POA: Diagnosis not present

## 2019-09-09 DIAGNOSIS — Z794 Long term (current) use of insulin: Secondary | ICD-10-CM | POA: Diagnosis not present

## 2019-09-09 DIAGNOSIS — E785 Hyperlipidemia, unspecified: Secondary | ICD-10-CM | POA: Diagnosis not present

## 2019-09-09 LAB — MICROALBUMIN / CREATININE URINE RATIO
Creatinine,U: 59.6 mg/dL
Microalb Creat Ratio: 1.2 mg/g (ref 0.0–30.0)
Microalb, Ur: <0.7 mg/dL (ref 0.0–1.9)

## 2019-09-09 LAB — POCT GLYCOSYLATED HEMOGLOBIN (HGB A1C): Hemoglobin A1C: 8 % — AB (ref 4.0–5.6)

## 2019-09-09 MED ORDER — ROSUVASTATIN CALCIUM 10 MG PO TABS: 10.0000 mg | ORAL_TABLET | Freq: Every day | ORAL | 6 refills | Status: DC

## 2019-09-09 NOTE — Progress Notes (Signed)
Name: Dana Dyer  Age/ Sex: 68 y.o., female   MRN/ DOB: [id number], [birth year]     PCP: Eulas Post, MD   Reason for Endocrinology Evaluation: Type 2 Diabetes Mellitus  Initial Endocrine Consultative Visit: 10/23/18    PATIENT IDENTIFIER: [name] is a 68 y.o. female with a past medical history of HTN, T2DM, Cirrhosis, Fibromyalgia and dyslipidemia . The patient has followed with Endocrinology clinic since 10/23/18 for consultative assistance with management of her diabetes.  DIABETIC HISTORY:  Dana Dyer was diagnosed with T2DM in 2005. She used to be on SU but does not recall intolerance. She is intolerant to victoza and invokana. She was started on insulin therapy in 2009. Her hemoglobin A1c has ranged from 7.8 % in 2012, peaking at 10.9% in 2018.   Ozempic was stopped in 06/2019 due to persistent nausea.  SUBJECTIVE:   During the last visit (06/06/2019): A1c was 8.4%. We stopped  Ozempic due to persistent nausea. We continued Lantus and Humalog.    Today (09/09/2019): Dana Dyer is here for a 3 month follow up on diabetes management.  She checks her blood sugars 1-2 times daily. The patient has not had hypoglycemic episodes since the last clinic visit.  Otherwise, the patient has not required any recent emergency interventions for hypoglycemia and has not had recent hospitalizations secondary to hyper or hypoglycemic episodes.      ROS: As per HPI and as detailed below: Review of Systems  HENT: Negative for congestion and sore throat.   Eyes: Negative for blurred vision and pain.  Respiratory: Negative for cough and shortness of breath.   Cardiovascular: Negative for chest pain and palpitations.  Gastrointestinal: Negative for abdominal pain, diarrhea and nausea.      HOME DIABETES REGIMEN:  Lantus 50 units daily Humalog 10 units with Breakfast  Humalog 18 units with lunch and supper  Metformin 1000 mg BID   METER DOWNLOAD SUMMARY:  9/22-10/04/2019 Fingerstick Blood Glucose Tests = 13 Average Number Tests/Day = 0.9 Overall Mean FS Glucose = 137   BG Ranges: Low = 85 High = 237   Hypoglycemic Events/30 Days: BG < 50 = 0 Episodes of symptomatic severe hypoglycemia =0       HISTORY:  Past Medical History:  Past Medical History:  Diagnosis Date  . ALLERGIC RHINITIS 10/09/2007  . Allergy   . Arthritis    hips, spine  . DIABETES MELLITUS, UNCONTROLLED 03/16/2009  . FATIGUE 10/21/2008  . FIBROMYALGIA 10/09/2007  . GERD (gastroesophageal reflux disease)    diet  controlled, no meds  . H/O vaginal hysterectomy   . HYPERLIPIDEMIA 10/09/2007   diet controlled, no meds  . HYPERTENSION 10/09/2007  . Morton's neuroma   . OBSTRUCTIVE SLEEP APNEA 10/09/2007  . Plantar fasciitis    History - right  . Sleep apnea    Uses CPAP  . TRANSAMINASES, SERUM, ELEVATED 07/08/2009   Past Surgical History:  Past Surgical History:  Procedure Laterality Date  . ABDOMINAL HYSTERECTOMY  1979   prolapsed uterus  . BASAL CELL CARCINOMA EXCISION    . BREAST BIOPSY     left 2012 stereo  . BREAST REDUCTION SURGERY Bilateral 1992  . COLONOSCOPY  2006   [facility]  . mamoplasty     reduction  . REDUCTION MAMMAPLASTY Bilateral   . TONSILLECTOMY  1959  . TUBAL LIGATION  1976    Social History:  reports that she has never smoked. She has never used smokeless tobacco. She  reports that she does not drink alcohol or use drugs. Family History:  Family History  Problem Relation Age of Onset  . Diabetes Mother   . Arthritis Mother   . Cancer Mother        breast  . Breast cancer Mother 57  . Alcohol abuse Father   . Arthritis Father   . Hypertension Maternal Grandfather   . Miscarriages / Stillbirths Paternal Grandfather   . Breast cancer Maternal Aunt        diagnosed in her 59's  . Colon polyps Neg Hx   . Colon cancer Neg Hx   . Esophageal cancer Neg Hx   . Rectal cancer Neg Hx   . Stomach cancer Neg Hx      HOME  MEDICATIONS: Allergies as of 09/09/2019      Reactions   Amoxicillin-pot Clavulanate    Severe diarrhea   Codeine Sulfate    REACTION: "hyper"   Erythromycin Base    Invokana [canagliflozin] Other (See Comments)   Recurrent yeast infections   Irbesartan-hydrochlorothiazide    Latex    Victoza [liraglutide] Nausea Only   Ozempic (0.25 Or 0.5 Mg-dose) [semaglutide(0.25 Or 0.52m-dos)] Nausea Only      Medication List       Accurate as of September 09, 2019  3:44 PM. If you have any questions, ask your nurse or doctor.        STOP taking these medications   pravastatin 40 MG tablet Commonly known as: PRAVACHOL Stopped by: IDorita Sciara MD   Semaglutide(0.25 or 0.5MG/DOS) 2 MG/1.5ML Sopn Commonly known as: Ozempic (0.25 or 0.5 MG/DOSE) Stopped by: IDorita Sciara MD     TAKE these medications   amLODipine 5 MG tablet Commonly known as: NORVASC TAKE 1 TABLET BY MOUTH EVERY DAY   b complex vitamins capsule Take 1 capsule by mouth daily.   BD Pen Needle Nano 2nd Gen 32G X 4 MM Misc Generic drug: Insulin Pen Needle 4x a day   escitalopram 10 MG tablet Commonly known as: LEXAPRO Take 1 tablet (10 mg total) by mouth daily.   hydrochlorothiazide 25 MG tablet Commonly known as: HYDRODIURIL Take 1 tablet (25 mg total) by mouth daily.   insulin lispro 100 UNIT/ML KwikPen Commonly known as: HumaLOG KwikPen Inject 0.1 mLs (10 Units total) into the skin daily with breakfast AND 0.18 mLs (18 Units total) 2 (two) times daily before lunch and supper.   Lantus SoloStar 100 UNIT/ML Solostar Pen Generic drug: Insulin Glargine Inject 50 Units into the skin at bedtime. Please disregard previous prescription   losartan 100 MG tablet Commonly known as: COZAAR Take 1 tablet (100 mg total) by mouth daily.   metFORMIN 1000 MG tablet Commonly known as: GLUCOPHAGE Take 1 tablet (1,000 mg total) by mouth 2 (two) times daily.   MISC NATURAL PRODUCTS PO Take by mouth.    moxifloxacin 0.5 % ophthalmic solution Commonly known as: VIGAMOX PLACE 1 DROP IN THE RIGHT EYE 3 TIMES DAILY   nadolol 40 MG tablet Commonly known as: CORGARD TAKE 1 TABLET BY MOUTH EVERY DAY   neomycin-polymyxin b-dexamethasone 3.5-10000-0.1 Oint Commonly known as: MAXITROL APPLY TO LOWER RIGHT EYELID MARGIN TWICE DAILY FOR 2 WEEKS   [facility]  Anti-fungal-#1   OneTouch Delica Plus [id number] CHECK 3 TIMES DAILY.   OneTouch Verio test strip Generic drug: glucose blood TEST 3 TIMES DAILY DX E11.9   rosuvastatin 10 MG tablet Commonly known as: Crestor Take 1 tablet (  10 mg total) by mouth daily. Started by: Dorita Sciara, MD        OBJECTIVE:   Vital Signs: BP (!) 124/58 (BP Location: Left Arm, Patient Position: Sitting, Cuff Size: Normal)   Pulse 76   Temp 98.3 F (36.8 C)   Ht 5' 2"  (1.575 m)   Wt 185 lb (83.9 kg)   SpO2 98%   BMI 33.84 kg/m   Wt Readings from Last 3 Encounters:  09/09/19 185 lb (83.9 kg)  07/24/19 182 lb 6.4 oz (82.7 kg)  07/05/19 183 lb 11.2 oz (83.3 kg)     Exam: General: Dana Dyer appears well and is in NAD  Lungs: Clear with good BS bilat with no rales, rhonchi, or wheezes  Heart: RRR with normal S1 and S2 and no gallops; no murmurs; no rub  Abdomen: Normoactive bowel sounds, soft, nontender, without masses or organomegaly palpable  Extremities: Trace pretibial edema.  Neuro: MS is good with appropriate affect, Dana Dyer is alert and Ox3   DM Foot 09/09/2019 The skin of the feet is intact without sores or ulcerations. The pedal pulses are 2+ on right and 2+ on left. The sensation is intact to a screening 5.07, 10 gram monofilament bilaterally     DATA REVIEWED:  Lab Results  Component Value Date   HGBA1C 8.0 (A) 09/09/2019   HGBA1C 8.4 (A) 06/06/2019   HGBA1C 8.4 (A) 02/06/2019   Lab Results  Component Value Date   MICROALBUR <0.7 09/09/2019   [place] 126 (H) 08/27/2019   CREATININE 0.73  07/05/2019   Results for NAUTICA, HOTZ (MRN [id number]) as of 09/09/2019 15:44  Ref. Range 09/09/2019 09:38  Creatinine,U Latest Units: mg/dL 59.6  Microalb, Ur Latest Ref Range: 0.0 - 1.9 mg/dL <0.7  MICROALB/CREAT RATIO Latest Ref Range: 0.0 - 30.0 mg/g 1.2    ASSESSMENT / PLAN / RECOMMENDATIONS:   1) Type 2 Diabetes Mellitus, Sub-Optimally controlled, With neuropathic complications - Most recent A1c of 8.0 %. Goal A1c < 7.0 %.   Plan:  - Her A1c continues to improve.  - We again emphasized the importance of checking glucose during the day for both safety and to use corrections if needed.  - She is intolerant to Ozempic. Will consider  - She recently has lab work, will only check microalbuminuria today which was normal.    MEDICATIONS: - Continue  Lantus at 50 units daily - Continue Humalog 10 units with breakfast, 18 units with Lunch and Supper  - Metformin 1000 mg BID  - CF : BG-150/25  Blood sugar before meal Number of units to inject  Less than 175 0 unit  176 -  200 1 units  201 -  225 2 units  226-  250 3 units  251 -  275 4 units  276 -  300 5 units  301 -  325 6 units  326 -  350 7 units  351 -  375 8 units    EDUCATION / INSTRUCTIONS:  BG monitoring instructions: Patient is instructed to check her blood sugars 4 times a day, before meals and bedtime.  Call [facility] if: BG persistently < 70 or > 300. . I reviewed the Rule of 15 for the treatment of hypoglycemia in detail with the patient. Literature supplied.  2. Dyslipidemia :  - She is on pravastatin and LDL continues to be elevated at 126 mg/dL. She was advised to increase her dose by her PCP but has not done so.  I have discussed switching her to lipitor vs crestor. She has reported intolerance in the past but would like to try crestor - She was also advised to start taking Vitamin D 1000 iu daily to help with myalgias.   MEDICATIONS: Stop Pravastatin  Start Crestor 10 mg QHS      F/U in 4 months    Signed electronically by: Mack Guise, MD  [facility]  [address] [place], [place] [address] Phone: [phone] FAX: [phone]   CC: Eulas Post, [place] [address] Phone: [phone]  Fax: [phone]  Return to Endocrinology clinic as below: Future Appointments  Date Time Provider [place]  11/25/2019 11:15 AM Eulas Post, MD [facility]  12/16/2019  8:15 AM Trula Slade, DPM [facility]  01/21/2020  9:30 AM Shamleffer, Melanie Crazier, MD [facility] None

## 2019-09-09 NOTE — Patient Instructions (Addendum)
-  Continue Lantus at 50 units daily  - Continue Humalog 10 units with Breakfast and 18 units with Lunch and supper - Correction Scale : Humalog This is to be added to your mealtime Humalog dose.    Blood sugar before meal Number of units to inject  Less than 175 0 unit  176 -  200 1 units  201 -  225 2 units  226-  250 3 units  251 -  275 4 units  276 -  300 5 units  301 -  325 6 units  326 -  350 7 units  351 -  375 8 units    - Stop Pravastatin and start crestor at 10 mg daily  - Start taking Vitamin D3 at 1000 iu daily (this is over the counter) this will help with aches and pains and strengthens your muscles.   Choose healthy, lower carb lower calorie snacks: toss salad, cooked vegetables, cottage cheese, peanut butter, low fat cheese / string cheese, lower sodium deli meat, tuna salad or chicken salad     HOW TO TREAT LOW BLOOD SUGARS (Blood sugar LESS THAN 70 MG/DL)  Please follow the RULE OF 15 for the treatment of hypoglycemia treatment (when your (blood sugars are less than 70 mg/dL)    STEP 1: Take 15 grams of carbohydrates when your blood sugar is low, which includes:   3-4 GLUCOSE TABS  OR  3-4 OZ OF JUICE OR REGULAR SODA OR  ONE TUBE OF GLUCOSE GEL     STEP 2: RECHECK blood sugar in 15 MINUTES STEP 3: If your blood sugar is still low at the 15 minute recheck --> then, go back to STEP 1 and treat AGAIN with another 15 grams of carbohydrates.

## 2019-09-23 ENCOUNTER — Other Ambulatory Visit: Payer: Self-pay | Admitting: Internal Medicine

## 2019-09-28 ENCOUNTER — Other Ambulatory Visit: Payer: Self-pay | Admitting: Family Medicine

## 2019-10-01 ENCOUNTER — Telehealth: Payer: Self-pay

## 2019-10-01 NOTE — Telephone Encounter (Signed)
Forms for [facility] DM supplies filled out, signed by Dr. Cruzita Lederer (Dr. Kelton Pillar out of clinic) and faxed to [facility] with confirmation.

## 2019-10-08 ENCOUNTER — Other Ambulatory Visit: Payer: Self-pay | Admitting: Family Medicine

## 2019-10-21 ENCOUNTER — Telehealth: Payer: Self-pay

## 2019-10-21 MED ORDER — NADOLOL 40 MG PO TABS: 40.0000 mg | ORAL_TABLET | Freq: Every day | ORAL | 11 refills | Status: DC

## 2019-10-21 NOTE — Telephone Encounter (Signed)
Yes, ok to refill at current dose, 30 days, 11 refills.  Thanks

## 2019-10-21 NOTE — Telephone Encounter (Signed)
Pharmacy requesting refill on patient's Nadolol, please advise Sir, thank you. Patient was seen in August.

## 2019-10-21 NOTE — Telephone Encounter (Signed)
Nadolol refilled as approved.

## 2019-11-19 ENCOUNTER — Encounter: Payer: Self-pay | Admitting: *Deleted

## 2019-11-21 ENCOUNTER — Telehealth: Payer: Self-pay

## 2019-11-21 NOTE — Telephone Encounter (Signed)
Spoke to pt and she stated she doesn't want to come in. Pt stated that this appt. Is only for a increase in medication. Pt articulated that her endocrinologist changed her cholesterol medication and was advised to take with Vit.D. Pt claimed that She thinks her endocrinologist will follow her cholesterol since its linked to the diabetes.

## 2019-11-21 NOTE — Telephone Encounter (Signed)
Copied from [place] [id number]. Topic: Appointment Scheduling - Scheduling Inquiry for Clinic >> Nov 21, 2019 10:04 AM Rayann Heman wrote: Reason for CRM: pt called and stated that she would like to change appointment on 11/25/19 to a virtual. Please advise

## 2019-11-22 NOTE — Telephone Encounter (Signed)
Called pt and turned into virtual

## 2019-11-25 ENCOUNTER — Telehealth (INDEPENDENT_AMBULATORY_CARE_PROVIDER_SITE_OTHER): Payer: Medicare Other | Admitting: Family Medicine

## 2019-11-25 ENCOUNTER — Other Ambulatory Visit: Payer: Self-pay

## 2019-11-25 DIAGNOSIS — G4700 Insomnia, unspecified: Secondary | ICD-10-CM | POA: Diagnosis not present

## 2019-11-25 DIAGNOSIS — E785 Hyperlipidemia, unspecified: Secondary | ICD-10-CM

## 2019-11-25 NOTE — Progress Notes (Signed)
This visit type was conducted due to national recommendations for restrictions regarding the COVID-19 pandemic in an effort to limit this patient's exposure and mitigate transmission in our community.   Virtual Visit via Telephone Note  I connected with Dana Dyer on 11/25/19 at 11:15 AM EST by telephone and verified that I am speaking with the correct person using two identifiers.   I discussed the limitations, risks, security and privacy concerns of performing an evaluation and management service by telephone and the availability of in person appointments. I also discussed with the patient that there may be a patient responsible charge related to this service. The patient expressed understanding and agreed to proceed.  Location patient: home Location provider: work or home office Participants present for the call: patient, provider Patient did not have a visit in the prior 7 days to address this/these issue(s).   History of Present Illness: Dana Dyer has type 2 diabetes with history of poor control, chronic elevated liver transaminases, nonalcoholic cirrhosis, obstructive sleep apnea, hypertension, dyslipidemia, and history of recurrent depression.  We had her on pravastatin but her lipids were poorly controlled and endocrinologist switched her over to Crestor.  She has tolerated this thus far at low dosage of 10 mg.  She has follow-up with them in January and will get repeat lipids then.  She had previous intolerance with Lipitor and Zocor  She feels her diabetes is improving.  Her last A1c was 8.0%.  Her major complaint is insomnia.  She states that she is not sleeping well at night and tends to sleep in late in the mornings.  She has also had some issues with her husband having some "night terrors" recently which have awakened her several times.  Past Medical History:  Diagnosis Date  . ALLERGIC RHINITIS 10/09/2007  . Allergy   . Arthritis    hips, spine  . DIABETES MELLITUS,  UNCONTROLLED 03/16/2009  . FATIGUE 10/21/2008  . FIBROMYALGIA 10/09/2007  . GERD (gastroesophageal reflux disease)    diet  controlled, no meds  . H/O vaginal hysterectomy   . HYPERLIPIDEMIA 10/09/2007   diet controlled, no meds  . HYPERTENSION 10/09/2007  . Morton's neuroma   . OBSTRUCTIVE SLEEP APNEA 10/09/2007  . Plantar fasciitis    History - right  . Sleep apnea    Uses CPAP  . TRANSAMINASES, SERUM, ELEVATED 07/08/2009   Past Surgical History:  Procedure Laterality Date  . ABDOMINAL HYSTERECTOMY  1979   prolapsed uterus  . BASAL CELL CARCINOMA EXCISION    . BREAST BIOPSY     left 2012 stereo  . BREAST REDUCTION SURGERY Bilateral 1992  . COLONOSCOPY  2006   [facility]  . mamoplasty     reduction  . REDUCTION MAMMAPLASTY Bilateral   . TONSILLECTOMY  1959  . TUBAL LIGATION  1976    reports that she has never smoked. She has never used smokeless tobacco. She reports that she does not drink alcohol or use drugs. family history includes Alcohol abuse in her father; Arthritis in her father and mother; Breast cancer in her maternal aunt; Breast cancer (age of onset: 12) in her mother; Cancer in her mother; Diabetes in her mother; Hypertension in her maternal grandfather; Miscarriages / [place] in her paternal grandfather. Allergies  Allergen Reactions  . Amoxicillin-Pot Clavulanate     Severe diarrhea  . Codeine Sulfate     REACTION: "hyper"  . Erythromycin Base   . Invokana [Canagliflozin] Other (See Comments)    Recurrent yeast infections  .  Irbesartan-Hydrochlorothiazide   . Latex   . Victoza [Liraglutide] Nausea Only  . Ozempic (0.25 Or 0.5 Mg-Dose) [Semaglutide(0.25 Or 0.24m-Dos)] Nausea Only      Observations/Objective: Patient sounds cheerful and well on the phone. I do not appreciate any SOB. Speech and thought processing are grossly intact. Patient reported vitals:  Assessment and Plan:  #1 dyslipidemia.  Goal LDL less than 70.  Recent transition from  pravastatin to rosuvastatin  -She has follow-up with endocrinology already scheduled for January and defer labs to endocrinology at that time for follow-up for lipids and hepatic  #2 insomnia.  -We discussed sleep hygiene.  We strongly advocated that she try to set an alarm in the mornings and avoid oversleeping -Avoid caffeine after 12 noon -Avoid stimulating lights and things at night that can cause wakefulness -Consider trial of melatonin -Avoid any regular benzos or sedative hypnotics  Follow Up Instructions:  -6 months   99441 5-10 99442 11-20 99443 21-30 I did not refer this patient for an OV in the next 24 hours for this/these issue(s).  I discussed the assessment and treatment plan with the patient. The patient was provided an opportunity to ask questions and all were answered. The patient agreed with the plan and demonstrated an understanding of the instructions.   The patient was advised to call back or seek an in-person evaluation if the symptoms worsen or if the condition fails to improve as anticipated.  I provided 15 minutes of non-face-to-face time during this encounter.   BCarolann Littler MD

## 2019-12-04 ENCOUNTER — Telehealth: Payer: Self-pay | Admitting: *Deleted

## 2019-12-04 ENCOUNTER — Other Ambulatory Visit: Payer: Self-pay

## 2019-12-04 DIAGNOSIS — K7460 Unspecified cirrhosis of liver: Secondary | ICD-10-CM

## 2019-12-04 NOTE — Telephone Encounter (Signed)
Dr Ardis Hughs- Patient came today for final twinrix injection but since it has not been 1 full year (first injection was 12/10/18), she is opting to wait for a couple weeks to make sure insurance will cover the injection.  I scheduled her for an office visit with you as your last office note indicates she needs return visit in 6 months. Patient, however, indicates that you typically like her to have ultrasound/labs completed prior to her office visits with you. Her last ultrasound was completed 07/26/19. Shall I go ahead and schedule u/s prior to visit with you? Labs? Or would you prefer to see her in office first?

## 2019-12-09 NOTE — Telephone Encounter (Signed)
She needs OV with me late February and it would be helpful for her to have [place] and labs (AFP, cmet, INR, cbc) a few days prior to that appt.  thanks

## 2019-12-09 NOTE — Telephone Encounter (Signed)
Lab order and [place] order in Epic.  The pt will be called in about 2-3 weeks to set up [place].

## 2019-12-16 ENCOUNTER — Ambulatory Visit: Payer: Medicare Other | Admitting: Podiatry

## 2019-12-19 ENCOUNTER — Other Ambulatory Visit: Payer: Self-pay | Admitting: Internal Medicine

## 2019-12-19 ENCOUNTER — Other Ambulatory Visit: Payer: Self-pay | Admitting: Endocrinology

## 2019-12-23 ENCOUNTER — Telehealth: Payer: Self-pay

## 2019-12-23 DIAGNOSIS — K7460 Unspecified cirrhosis of liver: Secondary | ICD-10-CM

## 2019-12-23 NOTE — Telephone Encounter (Signed)
You have been scheduled for an abdominal ultrasound at [facility] (1st floor of hospital) on 12/30/19  at 9 am. Please arrive 15 minutes prior to your appointment for registration. Make certain not to have anything to eat or drink 6 hours prior to your appointment. Should you need to reschedule your appointment, please contact radiology at [phone]. This test typically takes about 30 minutes to perform.  Pt has been notified via My Chart per request

## 2019-12-23 NOTE — Telephone Encounter (Signed)
-----   Message from Timothy Lasso, RN sent at 12/09/2019  3:21 PM EST ----- [place] and labs (AFP, cmet, INR, cbc) a few days prior to that appt.  thanks

## 2019-12-25 ENCOUNTER — Other Ambulatory Visit: Payer: Self-pay | Admitting: Family Medicine

## 2019-12-25 ENCOUNTER — Other Ambulatory Visit: Payer: Self-pay | Admitting: Internal Medicine

## 2019-12-28 ENCOUNTER — Other Ambulatory Visit: Payer: Self-pay | Admitting: Family Medicine

## 2019-12-30 ENCOUNTER — Other Ambulatory Visit: Payer: Self-pay

## 2019-12-30 ENCOUNTER — Ambulatory Visit (HOSPITAL_COMMUNITY)
Admission: RE | Admit: 2019-12-30 | Discharge: 2019-12-30 | Disposition: A | Discharge status: Home / Self Care | Payer: Medicare PPO | Source: Ambulatory Visit | Attending: Gastroenterology | Admitting: Gastroenterology

## 2019-12-30 ENCOUNTER — Other Ambulatory Visit (INDEPENDENT_AMBULATORY_CARE_PROVIDER_SITE_OTHER): Payer: Medicare PPO

## 2019-12-30 DIAGNOSIS — K7460 Unspecified cirrhosis of liver: Secondary | ICD-10-CM

## 2019-12-30 DIAGNOSIS — L820 Inflamed seborrheic keratosis: Secondary | ICD-10-CM | POA: Diagnosis not present

## 2019-12-30 DIAGNOSIS — L821 Other seborrheic keratosis: Secondary | ICD-10-CM | POA: Diagnosis not present

## 2019-12-30 DIAGNOSIS — Z85828 Personal history of other malignant neoplasm of skin: Secondary | ICD-10-CM | POA: Diagnosis not present

## 2019-12-30 LAB — PROTIME-INR
INR: 1.2 ratio — ABNORMAL HIGH (ref 0.8–1.0)
Prothrombin Time: 13.6 s — ABNORMAL HIGH (ref 9.6–13.1)

## 2019-12-30 LAB — COMPREHENSIVE METABOLIC PANEL
ALT: 35 U/L (ref 0–35)
AST: 43 U/L — ABNORMAL HIGH (ref 0–37)
Albumin: 3.6 g/dL (ref 3.5–5.2)
Alkaline Phosphatase: 94 U/L (ref 39–117)
BUN: 13 mg/dL (ref 6–23)
CO2: 33 mEq/L — ABNORMAL HIGH (ref 19–32)
Calcium: 9.7 mg/dL (ref 8.4–10.5)
Chloride: 98 mEq/L (ref 96–112)
Creatinine, Ser: 0.63 mg/dL (ref 0.40–1.20)
GFR: 93.32 mL/min (ref >60.00)
Glucose, Bld: 112 mg/dL — ABNORMAL HIGH (ref 70–99)
Potassium: 3.4 mEq/L — ABNORMAL LOW (ref 3.5–5.1)
Sodium: 139 mEq/L (ref 135–145)
Total Bilirubin: 1.2 mg/dL (ref 0.2–1.2)
Total Protein: 7.3 g/dL (ref 6.0–8.3)

## 2019-12-30 LAB — CBC WITH DIFFERENTIAL/PLATELET
Basophils Absolute: 0 10*3/uL (ref 0.0–0.1)
Basophils Relative: 0.7 % (ref 0.0–3.0)
Eosinophils Absolute: 0.2 10*3/uL (ref 0.0–0.7)
Eosinophils Relative: 3.4 % (ref 0.0–5.0)
HCT: 34.2 % — ABNORMAL LOW (ref 36.0–46.0)
Hemoglobin: 11.3 g/dL — ABNORMAL LOW (ref 12.0–15.0)
Lymphocytes Relative: 27.5 % (ref 12.0–46.0)
Lymphs Abs: 1.6 10*3/uL (ref 0.7–4.0)
MCHC: 33.1 g/dL (ref 30.0–36.0)
MCV: 84.7 fl (ref 78.0–100.0)
Monocytes Absolute: 0.7 10*3/uL (ref 0.1–1.0)
Monocytes Relative: 12.5 % — ABNORMAL HIGH (ref 3.0–12.0)
Neutro Abs: 3.2 10*3/uL (ref 1.4–7.7)
Neutrophils Relative %: 55.9 % (ref 43.0–77.0)
Platelets: 129 10*3/uL — ABNORMAL LOW (ref 150.0–400.0)
RBC: 4.04 Mil/uL (ref 3.87–5.11)
RDW: 14.1 % (ref 11.5–15.5)
WBC: 5.8 10*3/uL (ref 4.0–10.5)

## 2019-12-31 ENCOUNTER — Other Ambulatory Visit: Payer: Self-pay | Admitting: Family Medicine

## 2019-12-31 LAB — AFP TUMOR MARKER: AFP-Tumor Marker: 4.2 ng/mL

## 2020-01-07 ENCOUNTER — Encounter: Payer: Self-pay | Admitting: Gastroenterology

## 2020-01-07 ENCOUNTER — Ambulatory Visit: Payer: Medicare PPO | Admitting: Gastroenterology

## 2020-01-07 VITALS — BP 120/66 | HR 78 | Temp 97.6°F | Ht 62.0 in | Wt 186.0 lb

## 2020-01-07 DIAGNOSIS — K7460 Unspecified cirrhosis of liver: Secondary | ICD-10-CM | POA: Diagnosis not present

## 2020-01-07 DIAGNOSIS — Z23 Encounter for immunization: Secondary | ICD-10-CM | POA: Diagnosis not present

## 2020-01-07 NOTE — Progress Notes (Signed)
Review of pertinent gastrointestinal problems: 1.History of adenomatous polyps in her colon.Colonoscopy 2006 Dr. Timmothy Euler found no polyps. Colonoscopy Dr. Ardis Hughs December 2016 found 7 subcentimeter polyps. These were all adenomatous polyps on pathology. Also documented left-sided diverticulosis. I recommend she have a repeat colonoscopy at 3-year interval.Colonoscopy January 2020 four polyps were removed from her colon,all were adenomatous on pathology. She was recommended to have repeat colonoscopy at 3-year interval 2. Cirrhosis, likelyfrom longstanding fatty liver disease, out-of-control diabetes.Diagnosed 2019. Lab workup 2019:Hep C Ab negative, Hep B Surface Ag negative,hepatitis B surface antibody negative, hep B Core Igm negative, Hep A IgM negative. Ferritin normal, HIV negative, ANA negative,antimitochondrial antibody negative,hepatitis a antibody total negative;slightly low platelets, transaminases slightly elevated. No edema.  [place] 07/2018: slightly nodular,suspicious for cirrhosis. [place] 12/2019 cirrhosis without mass lesions  EGD1/2020 small distal esophagus varices, mild portal gastropathy, no gastric varices. She was started on nadolol (eventually at 23m daily)  AFP 12/2019 4.2  MELD 9 (12/2019 labs)  Hepatitis A, B vaccination series started 11/2018   HPI: This is a very pleasant 69year old woman whom I last saw about 6 months ago here in the office.  She is back today for routine cirrhosis follow-up.  She feels very well overall.  No overt encephalopathy.  She has noticed slight swelling in both of her ankles over the past few weeks.  She avoids salt quite well in her diet.  No abdominal pains, no abdominal swelling.  No overt GI bleeding.  Her weight is up 4 pounds since her last office visit here about 6 months ago, same scale here in our office.   ROS: complete GI ROS as described in HPI, all other review negative.  Constitutional:  No unintentional  weight loss   Past Medical History:  Diagnosis Date  . ALLERGIC RHINITIS 10/09/2007  . Allergy   . Arthritis    hips, spine  . DIABETES MELLITUS, UNCONTROLLED 03/16/2009  . FATIGUE 10/21/2008  . FIBROMYALGIA 10/09/2007  . GERD (gastroesophageal reflux disease)    diet  controlled, no meds  . H/O vaginal hysterectomy   . HYPERLIPIDEMIA 10/09/2007   diet controlled, no meds  . HYPERTENSION 10/09/2007  . Morton's neuroma   . OBSTRUCTIVE SLEEP APNEA 10/09/2007  . Plantar fasciitis    History - right  . Sleep apnea    Uses CPAP  . TRANSAMINASES, SERUM, ELEVATED 07/08/2009    Past Surgical History:  Procedure Laterality Date  . ABDOMINAL HYSTERECTOMY  1979   prolapsed uterus  . BASAL CELL CARCINOMA EXCISION    . BREAST BIOPSY     left 2012 stereo  . BREAST REDUCTION SURGERY Bilateral 1992  . COLONOSCOPY  2006   [facility]  . mamoplasty     reduction  . REDUCTION MAMMAPLASTY Bilateral   . TONSILLECTOMY  1959  . TUBAL LIGATION  1976    Current Outpatient Medications  Medication Sig Dispense Refill  . amLODipine (NORVASC) 5 MG tablet TAKE 1 TABLET BY MOUTH EVERY DAY 90 tablet 0  . escitalopram (LEXAPRO) 10 MG tablet TAKE 1 TABLET BY MOUTH EVERY DAY 90 tablet 0  . ferrous sulfate 325 (65 FE) MG tablet Take 325 mg by mouth daily with breakfast.    . HUMALOG KWIKPEN 100 UNIT/ML KwikPen INJECT 10 UNITS INTO THE SKIN EVERY DAY WITH BREAKFAST, 18 UNITS BEFORE LUNCH AND SUPPER IN ADDITION 15 mL 3  . hydrochlorothiazide (HYDRODIURIL) 25 MG tablet TAKE 1 TABLET BY MOUTH EVERY DAY 90 tablet 1  . Insulin Pen Needle (  BD PEN NEEDLE NANO 2ND GEN) 32G X 4 MM MISC 4x a day 360 each 3  . Lancets (ONETOUCH DELICA PLUS [id number]) MISC USE TO CHECK BLOOD 3 TIMES A DAY 300 each 0  . LANTUS SOLOSTAR 100 UNIT/ML Solostar Pen INJECT 50 UNITS INTO THE SKIN AT BEDTIME. 15 mL 1  . losartan (COZAAR) 100 MG tablet TAKE 1 TABLET BY MOUTH EVERY DAY 90 tablet 1  . metFORMIN (GLUCOPHAGE) 1000 MG tablet Take 1  tablet (1,000 mg total) by mouth 2 (two) times daily. 180 tablet 3  . MISC NATURAL PRODUCTS PO Take by mouth.    . moxifloxacin (VIGAMOX) 0.5 % ophthalmic solution PLACE 1 DROP IN THE RIGHT EYE 3 TIMES DAILY    . nadolol (CORGARD) 40 MG tablet Take 1 tablet (40 mg total) by mouth daily. 30 tablet 11  . neomycin-polymyxin b-dexamethasone (MAXITROL) 3.5-10000-0.1 OINT APPLY TO LOWER RIGHT EYELID MARGIN TWICE DAILY FOR 2 WEEKS    . [facility]  Anti-fungal-#1    . ONETOUCH VERIO test strip TEST 3 TIMES DAILY DX E11.9 300 each 1  . rosuvastatin (CRESTOR) 10 MG tablet Take 1 tablet (10 mg total) by mouth daily. 30 tablet 6   Current Facility-Administered Medications  Medication Dose Route Frequency Provider Last Rate Last Admin  . TDaP (BOOSTRIX) injection 0.5 mL  0.5 mL Intramuscular Once Eulas Post, MD        Allergies as of 01/07/2020 - Review Complete 01/07/2020  Allergen Reaction Noted  . Amoxicillin-pot clavulanate  03/09/2012  . Codeine sulfate  03/16/2009  . Erythromycin base  04/07/2009  . Invokana [canagliflozin] Other (See Comments) 05/18/2015  . Irbesartan-hydrochlorothiazide  04/07/2009  . Latex    . Victoza [liraglutide] Nausea Only 03/10/2016  . Ozempic (0.25 or 0.5 mg-dose) [semaglutide(0.25 or 0.21m-dos)] Nausea Only 09/09/2019    Family History  Problem Relation Age of Onset  . Diabetes Mother   . Arthritis Mother   . Cancer Mother        breast  . Breast cancer Mother 723 . Alcohol abuse Father   . Arthritis Father   . Hypertension Maternal Grandfather   . Miscarriages / Stillbirths Paternal Grandfather   . Breast cancer Maternal Aunt        diagnosed in her 768's . Colon polyps Neg Hx   . Colon cancer Neg Hx   . Esophageal cancer Neg Hx   . Rectal cancer Neg Hx   . Stomach cancer Neg Hx     Social History   Socioeconomic History  . Marital status: Married    Spouse name: Not on file  . Number of children: Not on file   . Years of education: Not on file  . Highest education level: Not on file  Occupational History  . Not on file  Tobacco Use  . Smoking status: Never Smoker  . Smokeless tobacco: Never Used  Substance and Sexual Activity  . Alcohol use: No    Alcohol/week: 0.0 standard drinks  . Drug use: No  . Sexual activity: Yes    Birth control/protection: Other-see comments, Post-menopausal    Comment: Hysterectomy  Other Topics Concern  . Not on file  Social History Narrative  . Not on file   Social Determinants of Health   Financial Resource Strain:   . Difficulty of Paying Living Expenses: Not on file  Food Insecurity:   . Worried About RCharity fundraiserin the Last Year: Not on file  .  Ran Out of Food in the Last Year: Not on file  Transportation Needs:   . Lack of Transportation (Medical): Not on file  . Lack of Transportation (Non-Medical): Not on file  Physical Activity:   . Days of Exercise per Week: Not on file  . Minutes of Exercise per Session: Not on file  Stress:   . Feeling of Stress : Not on file  Social Connections:   . Frequency of Communication with Friends and Family: Not on file  . Frequency of Social Gatherings with Friends and Family: Not on file  . Attends Religious Services: Not on file  . Active Member of Clubs or Organizations: Not on file  . Attends Archivist Meetings: Not on file  . Marital Status: Not on file  Intimate Partner Violence:   . Fear of Current or Ex-Partner: Not on file  . Emotionally Abused: Not on file  . Physically Abused: Not on file  . Sexually Abused: Not on file     Physical Exam: Temp 97.6 F (36.4 C)   Ht 5' 2"  (1.575 m)   Wt 186 lb (84.4 kg)   BMI 34.02 kg/m  Constitutional: generally well-appearing Psychiatric: alert and oriented x3 Abdomen: soft, nontender, nondistended, no obvious ascites, no peritoneal signs, normal bowel sounds Trace peripheral edema and ankles bilaterally  Assessment and plan: 69  y.o. female with cirrhosis  She is doing quite well clinically and biochemically.  Meld score 9 based on labs last month.  Her hemoglobin is up since she started iron supplements once daily.  She does have very minor swelling in her ankles but I do not think this is enough to start a diuretic pills at this point.  She has a cough that significantly worsens otherwise I like to see her back in 6 months with labs, hepatoma screening the week prior.  Please see the "Patient Instructions" section for addition details about the plan.  Owens Loffler, MD [facility] 01/07/2020, 10:25 AM   Total time on date of encounter was 30 minutes (this included time spent preparing to see the patient reviewing records; obtaining and/or reviewing separately obtained history; performing a medically appropriate exam and/or evaluation; counseling and educating the patient and family if present; ordering medications, tests or procedures if applicable; and documenting clinical information in the health record).

## 2020-01-07 NOTE — Patient Instructions (Signed)
If you are age 71 or older, your body mass index should be between 23-30. Your Body mass index is 34.02 kg/m. If this is out of the aforementioned range listed, please consider follow up with your Primary Care Provider.  If you are age 70 or younger, your body mass index should be between 19-25. Your Body mass index is 34.02 kg/m. If this is out of the aformentioned range listed, please consider follow up with your Primary Care Provider.   You will return to office for 6 month follow up with Dr Ardis Hughs. 1 week prior to appointment you will have labs and liver ultrasound.   Thank you, Dr Ardis Hughs

## 2020-01-09 ENCOUNTER — Other Ambulatory Visit: Payer: Self-pay | Admitting: Family Medicine

## 2020-01-15 ENCOUNTER — Telehealth: Payer: Self-pay | Admitting: Family Medicine

## 2020-01-15 NOTE — Telephone Encounter (Signed)
Called patient and let her know that both of these medications have already been filled and sent to the pharmacy. Patient verbalized an understanding.

## 2020-01-15 NOTE — Telephone Encounter (Signed)
Medication Refill: Amlodipine 90 supply, Hydrochlorothiazide 90 supply  Pharmacy: [facility] [phone]

## 2020-01-17 ENCOUNTER — Other Ambulatory Visit: Payer: Self-pay

## 2020-01-21 ENCOUNTER — Encounter: Payer: Self-pay | Admitting: Internal Medicine

## 2020-01-21 ENCOUNTER — Other Ambulatory Visit: Payer: Self-pay

## 2020-01-21 ENCOUNTER — Ambulatory Visit (INDEPENDENT_AMBULATORY_CARE_PROVIDER_SITE_OTHER): Payer: Medicare PPO | Admitting: Internal Medicine

## 2020-01-21 VITALS — BP 122/64 | HR 75 | Temp 98.2°F | Ht 62.0 in | Wt 186.0 lb

## 2020-01-21 DIAGNOSIS — E1165 Type 2 diabetes mellitus with hyperglycemia: Secondary | ICD-10-CM | POA: Diagnosis not present

## 2020-01-21 DIAGNOSIS — Z794 Long term (current) use of insulin: Secondary | ICD-10-CM | POA: Diagnosis not present

## 2020-01-21 LAB — POCT GLYCOSYLATED HEMOGLOBIN (HGB A1C): Hemoglobin A1C: 7.7 % — AB (ref 4.0–5.6)

## 2020-01-21 MED ORDER — INSULIN LISPRO (1 UNIT DIAL) 100 UNIT/ML (KWIKPEN): PEN_INJECTOR | SUBCUTANEOUS | 11 refills | Status: DC

## 2020-01-21 MED ORDER — LANTUS SOLOSTAR 100 UNIT/ML ~~LOC~~ SOPN: 45.0000 [IU] | PEN_INJECTOR | Freq: Every day | SUBCUTANEOUS | 3 refills | Status: DC

## 2020-01-21 NOTE — Patient Instructions (Addendum)
-   Decrease Lantus to 45 units daily  - Continue Humalog 10 units with Breakfast and 18 units with Lunch and supper      HOW TO TREAT LOW BLOOD SUGARS (Blood sugar LESS THAN 70 MG/DL)  Please follow the RULE OF 15 for the treatment of hypoglycemia treatment (when your (blood sugars are less than 70 mg/dL)    STEP 1: Take 15 grams of carbohydrates when your blood sugar is low, which includes:   3-4 GLUCOSE TABS  OR  3-4 OZ OF JUICE OR REGULAR SODA OR  ONE TUBE OF GLUCOSE GEL     STEP 2: RECHECK blood sugar in 15 MINUTES STEP 3: If your blood sugar is still low at the 15 minute recheck --> then, go back to STEP 1 and treat AGAIN with another 15 grams of carbohydrates.

## 2020-01-21 NOTE — Progress Notes (Signed)
Name: Dana Dyer  Age/ Sex: 69 y.o., female   MRN/ DOB: [id number], 16-Aug-1951     PCP: Eulas Post, MD   Reason for Endocrinology Evaluation: Type 2 Diabetes Mellitus  Initial Endocrine Consultative Visit: 10/23/18    PATIENT IDENTIFIER: [name] is a 69 y.o. female with a past medical history of HTN, T2DM, Cirrhosis, Fibromyalgia and dyslipidemia . The patient has followed with Endocrinology clinic since 10/23/18 for consultative assistance with management of her diabetes.  DIABETIC HISTORY:  Ms. Inch was diagnosed with T2DM in 2005. She used to be on SU but does not recall intolerance. She is intolerant to victoza and invokana. She was started on insulin therapy in 2009. Her hemoglobin A1c has ranged from 7.8 % in 2012, peaking at 10.9% in 2018.   Ozempic was stopped in 06/2019 due to persistent nausea.  Switched pravastatin to crestor 09/2019  SUBJECTIVE:   During the last visit (09/09/2019): A1c was 8.0%. We continued metformin and MDI regimen   Today (01/21/2020): Ms. Shropshire is here for a 4 month follow up on diabetes management.  She checks her blood sugars 2 times daily. The patient has not had hypoglycemic episodes since the last clinic visit per se.  But she does endorse episodes of sweating in the middle of the night, she also endorses tight BG's in the morning in the 70s and 80s.  Otherwise, the patient has not required any recent emergency interventions for hypoglycemia and has not had recent hospitalizations secondary to hyper or hypoglycemic episodes.   Vitamin D made her constipated  She has been tolerating Crestor without side effects  Last eye exam summer 2020    ROS: As per HPI and as detailed below: Review of Systems  HENT: Negative for congestion and sore throat.   Eyes: Negative for blurred vision and pain.  Respiratory: Negative for cough and shortness of breath.   Cardiovascular: Negative for chest pain and palpitations.    Gastrointestinal: Negative for abdominal pain, diarrhea and nausea.      HOME DIABETES REGIMEN:  Lantus 50 units daily Humalog 10 units with Breakfast  Humalog 18 units with lunch and supper  Metformin 1000 mg BID  Crestor 10 mg daily      METER DOWNLOAD SUMMARY:2/3-2/16/2021 Fingerstick Blood Glucose Tests = 17 Average Number Tests/Day = 1.2 Overall Mean FS Glucose = 145   BG Ranges: Low = 87 High = 233   Hypoglycemic Events/30 Days: BG < 50 = 0 Episodes of symptomatic severe hypoglycemia =0       HISTORY:  Past Medical History:  Past Medical History:  Diagnosis Date  . ALLERGIC RHINITIS 10/09/2007  . Allergy   . Arthritis    hips, spine  . DIABETES MELLITUS, UNCONTROLLED 03/16/2009  . FATIGUE 10/21/2008  . FIBROMYALGIA 10/09/2007  . GERD (gastroesophageal reflux disease)    diet  controlled, no meds  . H/O vaginal hysterectomy   . HYPERLIPIDEMIA 10/09/2007   diet controlled, no meds  . HYPERTENSION 10/09/2007  . Morton's neuroma   . OBSTRUCTIVE SLEEP APNEA 10/09/2007  . Plantar fasciitis    History - right  . Sleep apnea    Uses CPAP  . TRANSAMINASES, SERUM, ELEVATED 07/08/2009   Past Surgical History:  Past Surgical History:  Procedure Laterality Date  . ABDOMINAL HYSTERECTOMY  1979   prolapsed uterus  . BASAL CELL CARCINOMA EXCISION    . BREAST BIOPSY     left 2012 stereo  . BREAST REDUCTION  SURGERY Bilateral 1992  . COLONOSCOPY  2006   [facility]  . mamoplasty     reduction  . REDUCTION MAMMAPLASTY Bilateral   . TONSILLECTOMY  1959  . TUBAL LIGATION  1976    Social History:  reports that she has never smoked. She has never used smokeless tobacco. She reports that she does not drink alcohol or use drugs. Family History:  Family History  Problem Relation Age of Onset  . Diabetes Mother   . Arthritis Mother   . Cancer Mother        breast  . Breast cancer Mother 45  . Alcohol abuse Father   . Arthritis Father   . Hypertension Maternal  Grandfather   . Miscarriages / Stillbirths Paternal Grandfather   . Breast cancer Maternal Aunt        diagnosed in her 48's  . Colon polyps Neg Hx   . Colon cancer Neg Hx   . Esophageal cancer Neg Hx   . Rectal cancer Neg Hx   . Stomach cancer Neg Hx      HOME MEDICATIONS: Allergies as of 01/21/2020      Reactions   Amoxicillin-pot Clavulanate    Severe diarrhea   Codeine Sulfate    REACTION: "hyper"   Erythromycin Base    Invokana [canagliflozin] Other (See Comments)   Recurrent yeast infections   Irbesartan-hydrochlorothiazide    Latex    Victoza [liraglutide] Nausea Only   Ozempic (0.25 Or 0.5 Mg-dose) [semaglutide(0.25 Or 0.17m-dos)] Nausea Only      Medication List       Accurate as of January 21, 2020  9:30 AM. If you have any questions, ask your nurse or doctor.        amLODipine 5 MG tablet Commonly known as: NORVASC TAKE 1 TABLET BY MOUTH EVERY DAY   BD Pen Needle Nano 2nd Gen 32G X 4 MM Misc Generic drug: Insulin Pen Needle 4x a day   escitalopram 10 MG tablet Commonly known as: LEXAPRO TAKE 1 TABLET BY MOUTH EVERY DAY   ferrous sulfate 325 (65 FE) MG tablet Take 325 mg by mouth daily with breakfast.   HumaLOG KwikPen 100 UNIT/ML KwikPen Generic drug: insulin lispro INJECT 10 UNITS INTO THE SKIN EVERY DAY WITH BREAKFAST, 18 UNITS BEFORE LUNCH AND SUPPER IN ADDITION   hydrochlorothiazide 25 MG tablet Commonly known as: HYDRODIURIL TAKE 1 TABLET BY MOUTH EVERY DAY   Lantus SoloStar 100 UNIT/ML Solostar Pen Generic drug: Insulin Glargine INJECT 50 UNITS INTO THE SKIN AT BEDTIME.   losartan 100 MG tablet Commonly known as: COZAAR TAKE 1 TABLET BY MOUTH EVERY DAY   metFORMIN 1000 MG tablet Commonly known as: GLUCOPHAGE Take 1 tablet (1,000 mg total) by mouth 2 (two) times daily.   MISC NATURAL PRODUCTS PO Take by mouth.   moxifloxacin 0.5 % ophthalmic solution Commonly known as: VIGAMOX PLACE 1 DROP IN THE RIGHT EYE 3 TIMES DAILY     nadolol 40 MG tablet Commonly known as: CORGARD Take 1 tablet (40 mg total) by mouth daily.   neomycin-polymyxin b-dexamethasone 3.5-10000-0.1 Oint Commonly known as: MAXITROL APPLY TO LOWER RIGHT EYELID MARGIN TWICE DAILY FOR 2 WEEKS   [facility]  Anti-fungal-#1   OneTouch Delica Plus [id number] USE TO CHECK BLOOD 3 TIMES A DAY   OneTouch Verio test strip Generic drug: glucose blood TEST 3 TIMES DAILY DX E11.9   rosuvastatin 10 MG tablet Commonly known as: Crestor Take 1 tablet (10 mg total)  by mouth daily.        OBJECTIVE:   Vital Signs: BP 122/64 (BP Location: Left Arm, Patient Position: Sitting, Cuff Size: Normal)   Pulse 75   Temp 98.2 F (36.8 C)   Ht 5' 2"  (1.575 m)   Wt 186 lb (84.4 kg)   SpO2 98%   BMI 34.02 kg/m   Wt Readings from Last 3 Encounters:  01/21/20 186 lb (84.4 kg)  01/07/20 186 lb (84.4 kg)  09/09/19 185 lb (83.9 kg)     Exam: General: Pt appears well and is in NAD  Lungs: Clear with good BS bilat with no rales, rhonchi, or wheezes  Heart: RRR with normal S1 and S2 and no gallops; no murmurs; no rub  Abdomen: Normoactive bowel sounds, soft, nontender, without masses or organomegaly palpable  Extremities: Trace pretibial edema.  Neuro: MS is good with appropriate affect, pt is alert and Ox3   DM Foot 09/09/2019 The skin of the feet is intact without sores or ulcerations. The pedal pulses are 2+ on right and 2+ on left. The sensation is intact to a screening 5.07, 10 gram monofilament bilaterally     DATA REVIEWED:  Lab Results  Component Value Date   HGBA1C 7.7 (A) 01/21/2020   HGBA1C 8.0 (A) 09/09/2019   HGBA1C 8.4 (A) 06/06/2019   Lab Results  Component Value Date   MICROALBUR <0.7 09/09/2019   LDLCALC 126 (H) 08/27/2019   CREATININE 0.63 12/30/2019   Results for LYSHA, SCHRADE (MRN [id number]) as of 09/09/2019 15:44  Ref. Range 09/09/2019 09:38  Creatinine,U Latest Units: mg/dL 59.6   Microalb, Ur Latest Ref Range: 0.0 - 1.9 mg/dL <0.7  MICROALB/CREAT RATIO Latest Ref Range: 0.0 - 30.0 mg/g 1.2    ASSESSMENT / PLAN / RECOMMENDATIONS:   1) Type 2 Diabetes Mellitus, Sub-Optimally controlled, With neuropathic complications - Most recent A1c of 7.7 %. Goal A1c < 7.0 %.   - Her A1c continues to improve.  - She is intolerant to Ozempic.  -Given her reported episodes of sweating in the middle of the night and the presence of tight BG's in the fasting status I am going to go ahead and reduce her Lantus dose as below. -I have encouraged her to check glucose in the evenings, there is no data on the meter for this period of the day.  At this time we will continue Humalog dose the same.   MEDICATIONS: -Decrease Lantus to 45 units daily - Continue Humalog 10 units with breakfast, 18 units with Lunch and Supper  - Metformin 1000 mg BID    EDUCATION / INSTRUCTIONS:  BG monitoring instructions: Patient is instructed to check her blood sugars 4 times a day, before meals and bedtime.  Call [facility] if: BG persistently < 70 or > 300. . I reviewed the Rule of 15 for the treatment of hypoglycemia in detail with the patient. Literature supplied.  2. Dyslipidemia :  - She was on pravastatin and LDL continued to be elevated at 126 mg/dL. Started Crestor 09/2019.  She is tolerating it without side effects, will check on next visit if this has not been done already by her PCP.   MEDICATIONS: Crestor 10 mg QHS     F/U in 3 months    Signed electronically by: Mack Guise, MD  [facility]  [facility] [address] [place], [place] [address] Phone: [phone] FAX: [phone]   CC: Eulas Post, MD  [place] [address] Phone: [phone]  Fax: [phone]  Return to Endocrinology clinic as below: No future appointments.

## 2020-01-24 ENCOUNTER — Telehealth (INDEPENDENT_AMBULATORY_CARE_PROVIDER_SITE_OTHER): Payer: Medicare PPO | Admitting: Family Medicine

## 2020-01-24 ENCOUNTER — Other Ambulatory Visit: Payer: Self-pay

## 2020-01-24 DIAGNOSIS — R0600 Dyspnea, unspecified: Secondary | ICD-10-CM

## 2020-01-24 DIAGNOSIS — R0609 Other forms of dyspnea: Secondary | ICD-10-CM

## 2020-01-24 DIAGNOSIS — R011 Cardiac murmur, unspecified: Secondary | ICD-10-CM

## 2020-01-24 NOTE — Progress Notes (Signed)
This visit type was conducted due to national recommendations for restrictions regarding the COVID-19 pandemic in an effort to limit this patient's exposure and mitigate transmission in our community.   Virtual Visit via Video Note  I connected with Dana Dyer on 01/24/20 at 11:00 AM EST by a video enabled telemedicine application and verified that I am speaking with the correct person using two identifiers.  Location patient: home Location provider:work or home office Persons participating in the virtual visit: patient, provider  I discussed the limitations of evaluation and management by telemedicine and the availability of in person appointments. The patient expressed understanding and agreed to proceed.   HPI: Dana Dyer has chronic problems including history of type 2 diabetes, obstructive sleep apnea, hyperlipidemia, fibromyalgia, hypertension, nonalcoholic fatty liver disease.  She was here for physical last year we noted relatively faint murmur left sternal border.  She called because she states that since sometime in late fall she has noticed some increased exertional dyspnea.  No chest pain.  For example, going [place] in her neighborhood she has had increased shortness of breath.  She is not aware of any coughing or wheezing.  No fever.  No history of syncope or significant dizziness.  Never smoked.  She states she had a stress test back in [place] sometime back in the late 1980s but none since then.  She realizes that she has some deconditioning.  She does not exercise regularly.  No known history of CAD.  When she had physical several months ago we noted a faint murmur which we had not noted previously.  We did mention at that point consideration for echocardiogram if she had any symptoms such as dizziness, syncope, or dyspnea.  She did get her first Covid vaccine on the 13th of this month   ROS: See pertinent positives and negatives per HPI.  Past Medical History:  Diagnosis  Date  . ALLERGIC RHINITIS 10/09/2007  . Allergy   . Arthritis    hips, spine  . DIABETES MELLITUS, UNCONTROLLED 03/16/2009  . FATIGUE 10/21/2008  . FIBROMYALGIA 10/09/2007  . GERD (gastroesophageal reflux disease)    diet  controlled, no meds  . H/O vaginal hysterectomy   . HYPERLIPIDEMIA 10/09/2007   diet controlled, no meds  . HYPERTENSION 10/09/2007  . Morton's neuroma   . OBSTRUCTIVE SLEEP APNEA 10/09/2007  . Plantar fasciitis    History - right  . Sleep apnea    Uses CPAP  . TRANSAMINASES, SERUM, ELEVATED 07/08/2009    Past Surgical History:  Procedure Laterality Date  . ABDOMINAL HYSTERECTOMY  1979   prolapsed uterus  . BASAL CELL CARCINOMA EXCISION    . BREAST BIOPSY     left 2012 stereo  . BREAST REDUCTION SURGERY Bilateral 1992  . COLONOSCOPY  2006   [facility]  . mamoplasty     reduction  . REDUCTION MAMMAPLASTY Bilateral   . TONSILLECTOMY  1959  . TUBAL LIGATION  1976    Family History  Problem Relation Age of Onset  . Diabetes Mother   . Arthritis Mother   . Cancer Mother        breast  . Breast cancer Mother 78  . Alcohol abuse Father   . Arthritis Father   . Hypertension Maternal Grandfather   . Miscarriages / Stillbirths Paternal Grandfather   . Breast cancer Maternal Aunt        diagnosed in her 103's  . Colon polyps Neg Hx   . Colon cancer Neg Hx   .  Esophageal cancer Neg Hx   . Rectal cancer Neg Hx   . Stomach cancer Neg Hx     SOCIAL HX: Non-smoker   Current Outpatient Medications:  .  amLODipine (NORVASC) 5 MG tablet, TAKE 1 TABLET BY MOUTH EVERY DAY, Disp: 90 tablet, Rfl: 0 .  escitalopram (LEXAPRO) 10 MG tablet, TAKE 1 TABLET BY MOUTH EVERY DAY, Disp: 90 tablet, Rfl: 0 .  ferrous sulfate 325 (65 FE) MG tablet, Take 325 mg by mouth daily with breakfast., Disp: , Rfl:  .  hydrochlorothiazide (HYDRODIURIL) 25 MG tablet, TAKE 1 TABLET BY MOUTH EVERY DAY, Disp: 90 tablet, Rfl: 1 .  Insulin Glargine (LANTUS SOLOSTAR) 100 UNIT/ML Solostar Pen,  Inject 45 Units into the skin daily., Disp: 15 mL, Rfl: 3 .  insulin lispro (HUMALOG KWIKPEN) 100 UNIT/ML KwikPen, Inject 0.1 mLs (10 Units total) into the skin daily with breakfast AND 0.18 mLs (18 Units total) daily with lunch AND 0.18 mLs (18 Units total) daily with supper., Disp: 15 mL, Rfl: 11 .  Insulin Pen Needle (BD PEN NEEDLE NANO 2ND GEN) 32G X 4 MM MISC, 4x a day, Disp: 360 each, Rfl: 3 .  Lancets (ONETOUCH DELICA PLUS GGYIRS85I) MISC, USE TO CHECK BLOOD 3 TIMES A DAY, Disp: 300 each, Rfl: 0 .  losartan (COZAAR) 100 MG tablet, TAKE 1 TABLET BY MOUTH EVERY DAY, Disp: 90 tablet, Rfl: 1 .  metFORMIN (GLUCOPHAGE) 1000 MG tablet, Take 1 tablet (1,000 mg total) by mouth 2 (two) times daily., Disp: 180 tablet, Rfl: 3 .  MISC NATURAL PRODUCTS PO, Take by mouth., Disp: , Rfl:  .  moxifloxacin (VIGAMOX) 0.5 % ophthalmic solution, PLACE 1 DROP IN THE RIGHT EYE 3 TIMES DAILY, Disp: , Rfl:  .  nadolol (CORGARD) 40 MG tablet, Take 1 tablet (40 mg total) by mouth daily., Disp: 30 tablet, Rfl: 11 .  neomycin-polymyxin b-dexamethasone (MAXITROL) 3.5-10000-0.1 OINT, APPLY TO LOWER RIGHT EYELID MARGIN TWICE DAILY FOR 2 WEEKS, Disp: , Rfl:  .  NON FORMULARY, [employer]  Anti-fungal-#1, Disp: , Rfl:  .  ONETOUCH VERIO test strip, TEST 3 TIMES DAILY DX E11.9, Disp: 300 each, Rfl: 1 .  rosuvastatin (CRESTOR) 10 MG tablet, Take 1 tablet (10 mg total) by mouth daily., Disp: 30 tablet, Rfl: 6  Current Facility-Administered Medications:  .  TDaP (BOOSTRIX) injection 0.5 mL, 0.5 mL, Intramuscular, Once, , Alinda Sierras, MD  EXAM:  VITALS per patient if applicable:  GENERAL: alert, oriented, appears well and in no acute distress  HEENT: atraumatic, conjunttiva clear, no obvious abnormalities on inspection of external nose and ears  NECK: normal movements of the head and neck  LUNGS: on inspection no signs of respiratory distress, breathing rate appears normal, no obvious gross SOB, gasping or  wheezing  CV: no obvious cyanosis  MS: moves all visible extremities without noticeable abnormality  PSYCH/NEURO: pleasant and cooperative, no obvious depression or anxiety, speech and thought processing grossly intact  ASSESSMENT AND PLAN:  Discussed the following assessment and plan:  Exertional dyspnea now for several months.  No history of chest pain.  Patient also has murmur that was recently noted left sternal border physical exam.  She realizes she probably has some deconditioning but exertional dyspnea needs further assessment especially with her underlying comorbidities and also with recent noted murmur We did explain that with her diabetes we also have to consider things like silent angina presenting with dyspnea predominantly.  -Start with echocardiogram. -Would have low threshold to consider cardiology  assessment further if echo unrevealing -Follow-up immediately for any chest pain or other new symptoms     I discussed the assessment and treatment plan with the patient. The patient was provided an opportunity to ask questions and all were answered. The patient agreed with the plan and demonstrated an understanding of the instructions.   The patient was advised to call back or seek an in-person evaluation if the symptoms worsen or if the condition fails to improve as anticipated.     Carolann Littler, MD

## 2020-02-06 DIAGNOSIS — L821 Other seborrheic keratosis: Secondary | ICD-10-CM | POA: Diagnosis not present

## 2020-02-06 DIAGNOSIS — B078 Other viral warts: Secondary | ICD-10-CM | POA: Diagnosis not present

## 2020-02-06 DIAGNOSIS — L720 Epidermal cyst: Secondary | ICD-10-CM | POA: Diagnosis not present

## 2020-02-06 DIAGNOSIS — Z85828 Personal history of other malignant neoplasm of skin: Secondary | ICD-10-CM | POA: Diagnosis not present

## 2020-02-06 DIAGNOSIS — D485 Neoplasm of uncertain behavior of skin: Secondary | ICD-10-CM | POA: Diagnosis not present

## 2020-02-12 ENCOUNTER — Ambulatory Visit (HOSPITAL_COMMUNITY): Payer: Medicare PPO | Attending: Cardiovascular Disease

## 2020-02-12 ENCOUNTER — Other Ambulatory Visit: Payer: Self-pay

## 2020-02-12 DIAGNOSIS — R0609 Other forms of dyspnea: Secondary | ICD-10-CM

## 2020-02-12 DIAGNOSIS — R0600 Dyspnea, unspecified: Secondary | ICD-10-CM | POA: Diagnosis not present

## 2020-03-09 ENCOUNTER — Other Ambulatory Visit: Payer: Self-pay | Admitting: Internal Medicine

## 2020-03-09 ENCOUNTER — Encounter: Payer: Self-pay | Admitting: Internal Medicine

## 2020-03-09 ENCOUNTER — Encounter: Payer: Self-pay | Admitting: Family Medicine

## 2020-03-09 MED ORDER — INSULIN PEN NEEDLE 32G X 4 MM MISC: 1.0000 | 3 refills | Status: DC

## 2020-03-09 MED ORDER — LANTUS SOLOSTAR 100 UNIT/ML ~~LOC~~ SOPN: 45.0000 [IU] | PEN_INJECTOR | Freq: Every day | SUBCUTANEOUS | 3 refills | Status: DC

## 2020-03-09 MED ORDER — INSULIN LISPRO (1 UNIT DIAL) 100 UNIT/ML (KWIKPEN): PEN_INJECTOR | SUBCUTANEOUS | 3 refills | Status: DC

## 2020-03-23 ENCOUNTER — Other Ambulatory Visit: Payer: Self-pay

## 2020-03-23 ENCOUNTER — Ambulatory Visit (INDEPENDENT_AMBULATORY_CARE_PROVIDER_SITE_OTHER): Payer: Medicare PPO

## 2020-03-23 ENCOUNTER — Ambulatory Visit: Payer: Medicare PPO | Admitting: Family Medicine

## 2020-03-23 ENCOUNTER — Encounter: Payer: Self-pay | Admitting: Family Medicine

## 2020-03-23 ENCOUNTER — Other Ambulatory Visit: Payer: Self-pay | Admitting: Family Medicine

## 2020-03-23 VITALS — BP 110/64 | HR 75 | Ht 62.0 in | Wt 185.0 lb

## 2020-03-23 DIAGNOSIS — G8929 Other chronic pain: Secondary | ICD-10-CM

## 2020-03-23 DIAGNOSIS — M545 Low back pain, unspecified: Secondary | ICD-10-CM

## 2020-03-23 DIAGNOSIS — M25551 Pain in right hip: Secondary | ICD-10-CM | POA: Diagnosis not present

## 2020-03-23 DIAGNOSIS — M5136 Other intervertebral disc degeneration, lumbar region: Secondary | ICD-10-CM

## 2020-03-23 DIAGNOSIS — M25552 Pain in left hip: Secondary | ICD-10-CM | POA: Diagnosis not present

## 2020-03-23 MED ORDER — GABAPENTIN 100 MG PO CAPS: 200.0000 mg | ORAL_CAPSULE | Freq: Every day | ORAL | 3 refills | Status: DC

## 2020-03-23 NOTE — Progress Notes (Signed)
[facility] [address] [place] Phone: [phone] Subjective:   I Dana Dyer am serving as a Education administrator for Dr. Hulan Saas.  This visit occurred during the SARS-CoV-2 public health emergency.  Safety protocols were in place, including screening questions prior to the visit, additional usage of staff PPE, and extensive cleaning of exam room while observing appropriate contact time as indicated for disinfecting solutions.   I'm seeing this patient by the request  of:  Eulas Post, MD  CC:   [id number]  Dana Dyer is a 69 y.o. female coming in with complaint of low back pain. Patient states she in February she started playing golf. Left sided SI joint pain that radiates to the mid back. Left sided groin pain. States the hip gives way. Patient states she enjoys walking but can't walk as much as she would like.   Onset- last summer while playing golf  Location - Left sided SI joint pain Duration- all day  Character- burning, muscle spasm Aggravating factors- golf, walking, flexion  Reliving factors-  Therapies tried- topical, afrin  Severity- 8/10 at its worse      Past Medical History:  Diagnosis Date  . ALLERGIC RHINITIS 10/09/2007  . Allergy   . Arthritis    hips, spine  . DIABETES MELLITUS, UNCONTROLLED 03/16/2009  . FATIGUE 10/21/2008  . FIBROMYALGIA 10/09/2007  . GERD (gastroesophageal reflux disease)    diet  controlled, no meds  . H/O vaginal hysterectomy   . HYPERLIPIDEMIA 10/09/2007   diet controlled, no meds  . HYPERTENSION 10/09/2007  . Morton's neuroma   . OBSTRUCTIVE SLEEP APNEA 10/09/2007  . Plantar fasciitis    History - right  . Sleep apnea    Uses CPAP  . TRANSAMINASES, SERUM, ELEVATED 07/08/2009   Past Surgical History:  Procedure Laterality Date  . ABDOMINAL HYSTERECTOMY  1979   prolapsed uterus  . BASAL CELL CARCINOMA EXCISION    . BREAST BIOPSY     left 2012 stereo  . BREAST  REDUCTION SURGERY Bilateral 1992  . COLONOSCOPY  2006   [facility]  . mamoplasty     reduction  . REDUCTION MAMMAPLASTY Bilateral   . TONSILLECTOMY  1959  . TUBAL LIGATION  1976   Social History   Socioeconomic History  . Marital status: Married    Spouse name: Not on file  . Number of children: Not on file  . Years of education: Not on file  . Highest education level: Not on file  Occupational History  . Not on file  Tobacco Use  . Smoking status: Never Smoker  . Smokeless tobacco: Never Used  Substance and Sexual Activity  . Alcohol use: No    Alcohol/week: 0.0 standard drinks  . Drug use: No  . Sexual activity: Yes    Birth control/protection: Other-see comments, Post-menopausal    Comment: Hysterectomy  Other Topics Concern  . Not on file  Social History Narrative  . Not on file   Social Determinants of Health   Financial Resource Strain:   . Difficulty of Paying Living Expenses:   Food Insecurity:   . Worried About Charity fundraiser in the Last Year:   . Arboriculturist in the Last Year:   Transportation Needs:   . Film/video editor (Medical):   Marland Kitchen Lack of Transportation (Non-Medical):   Physical Activity:   . Days of Exercise per Week:   . Minutes of Exercise per Session:  Stress:   . Feeling of Stress :   Social Connections:   . Frequency of Communication with Friends and Family:   . Frequency of Social Gatherings with Friends and Family:   . Attends Religious Services:   . Active Member of Clubs or Organizations:   . Attends Archivist Meetings:   Marland Kitchen Marital Status:    Allergies  Allergen Reactions  . Amoxicillin-Pot Clavulanate     Severe diarrhea  . Codeine Sulfate     REACTION: "hyper"  . Erythromycin Base   . Invokana [Canagliflozin] Other (See Comments)    Recurrent yeast infections  . Irbesartan-Hydrochlorothiazide   . Latex   . Victoza [Liraglutide] Nausea Only  . Ozempic (0.25 Or 0.5 Mg-Dose) [Semaglutide(0.25 Or  0.19m-Dos)] Nausea Only   Family History  Problem Relation Age of Onset  . Diabetes Mother   . Arthritis Mother   . Cancer Mother        breast  . Breast cancer Mother 735 . Alcohol abuse Father   . Arthritis Father   . Hypertension Maternal Grandfather   . Miscarriages / Stillbirths Paternal Grandfather   . Breast cancer Maternal Aunt        diagnosed in her 726's . Colon polyps Neg Hx   . Colon cancer Neg Hx   . Esophageal cancer Neg Hx   . Rectal cancer Neg Hx   . Stomach cancer Neg Hx     Current Outpatient Medications (Endocrine & Metabolic):  .  insulin glargine (LANTUS SOLOSTAR) 100 UNIT/ML Solostar Pen, Inject 45 Units into the skin daily. .  insulin lispro (HUMALOG KWIKPEN) 100 UNIT/ML KwikPen, Inject 0.1 mLs (10 Units total) into the skin daily with breakfast AND 0.18 mLs (18 Units total) daily with lunch AND 0.18 mLs (18 Units total) daily with supper. .  metFORMIN (GLUCOPHAGE) 1000 MG tablet, Take 1 tablet (1,000 mg total) by mouth 2 (two) times daily.   Current Outpatient Medications (Cardiovascular):  .  amLODipine (NORVASC) 5 MG tablet, TAKE 1 TABLET BY MOUTH EVERY DAY .  hydrochlorothiazide (HYDRODIURIL) 25 MG tablet, TAKE 1 TABLET BY MOUTH EVERY DAY .  losartan (COZAAR) 100 MG tablet, TAKE 1 TABLET BY MOUTH EVERY DAY .  nadolol (CORGARD) 40 MG tablet, Take 1 tablet (40 mg total) by mouth daily. .  rosuvastatin (CRESTOR) 10 MG tablet, Take 1 tablet (10 mg total) by mouth daily.       Current Outpatient Medications (Hematological):  .  ferrous sulfate 325 (65 FE) MG tablet, Take 325 mg by mouth daily with breakfast.   Current Outpatient Medications (Other):  .  escitalopram (LEXAPRO) 10 MG tablet, TAKE 1 TABLET BY MOUTH EVERY DAY .  Insulin Pen Needle 32G X 4 MM MISC, 1 Device by Does not apply route as directed. .  Lancets (ONETOUCH DELICA PLUS [id number] MISC, USE TO CHECK BLOOD 3 TIMES A DAY .  MISC NATURAL PRODUCTS PO, Take by mouth. .  moxifloxacin  (VIGAMOX) 0.5 % ophthalmic solution, PLACE 1 DROP IN THE RIGHT EYE 3 TIMES DAILY .  neomycin-polymyxin b-dexamethasone (MAXITROL) 3.5-10000-0.1 OINT, APPLY TO LOWER RIGHT EYELID MARGIN TWICE DAILY FOR 2 WEEKS .  NON FORMULARY, [employer] Anti-fungal-#1 .  ONETOUCH VERIO test strip, TEST 3 TIMES DAILY DX E11.9 .  gabapentin (NEURONTIN) 100 MG capsule, Take 2 capsules (200 mg total) by mouth at bedtime.  Current Facility-Administered Medications (Other):  .Marland Kitchen TDaP (BOOSTRIX) injection 0.5 mL   Reviewed prior external information  including notes and imaging from  primary care provider As well as notes that were available from care everywhere and other healthcare systems.  Past medical history, social, surgical and family history all reviewed in electronic medical record.  No pertanent information unless stated regarding to the chief complaint.   Review of Systems:  No headache, visual changes, nausea, vomiting, diarrhea, constipation, dizziness, abdominal pain, skin rash, fevers, chills, night sweats, weight loss, swollen lymph nodes, body aches, joint swelling, chest pain, shortness of breath, mood changes. POSITIVE muscle aches  Objective  Blood pressure 110/64, pulse 75, height 5' 2"  (1.575 m), weight 185 lb (83.9 kg), SpO2 98 %.   General: No apparent distress alert and oriented x3 mood and affect normal, dressed appropriately.  HEENT: Pupils equal, extraocular movements intact  Respiratory: Patient's speak in full sentences and does not appear short of breath  Cardiovascular: No lower extremity edema, non tender, no erythema  Neuro: Cranial nerves II through XII are intact, neurovascularly intact in all extremities with 2+ DTRs and 2+ pulses.  Gait normal with good balance and coordination.  MSK:  Non tender with full range of motion and good stability and symmetric strength and tone of shoulders, elbows, wrist, hip, knee and ankles bilaterally.  Back exam does have loss of  lordosis.  Patient does have tenderness to palpation over the left sacroiliac joint.  Positive FABER test.  5 out of 5 strength of lower extremities bilaterally.  Patient is back though does have worsening pain with extension.  [id number]; 15 additional minutes spent for Therapeutic exercises as stated in above notes.  This included exercises focusing on stretching, strengthening, with significant focus on eccentric aspects.   Long term goals include an improvement in range of motion, strength, endurance as well as avoiding reinjury. Patient's frequency would include in 1-2 times a day, 3-5 times a week for a duration of 6-12 weeks. Low back exercises that included:  Pelvic tilt/bracing instruction to focus on control of the pelvic girdle and lower abdominal muscles  Glute strengthening exercises, focusing on proper firing of the glutes without engaging the low back muscles Proper stretching techniques for maximum relief for the hamstrings, hip flexors, low back and some rotation where tolerated    Proper technique shown and discussed handout in great detail with ATC.  All questions were discussed and answered.     Impression and Recommendations:     This case required medical decision making of moderate complexity. The above documentation has been reviewed and is accurate and complete Lyndal Pulley, DO       Note: This dictation was prepared with Dragon dictation along with smaller phrase technology. Any transcriptional errors that result from this process are unintentional.

## 2020-03-23 NOTE — Patient Instructions (Addendum)
Good to see you Ice 20 minutes 2 times daily. Usually after activity and before bed. Exercises 3 times a week.  Gabapentin 200 mg at night  Turmeric 550m daily  Tart cherry extract 12066mat night Vitamin D 2000 IU daily  See me again in 6 weeks

## 2020-03-23 NOTE — Assessment & Plan Note (Signed)
Degenerative disc disease likely causing some spinal stenosis.  Patient wants to try conservative therapy and given gabapentin, home exercise, icing regimen, discussed which activities to do which wants to avoid.  Patient will increase activity slowly over the course of the next several weeks.  Patient has had different other problems such as cirrhosis and will be careful with other medications.  Follow-up again in 4 to 8 weeks

## 2020-03-30 ENCOUNTER — Encounter: Payer: Self-pay | Admitting: Internal Medicine

## 2020-04-04 ENCOUNTER — Other Ambulatory Visit: Payer: Self-pay | Admitting: Family Medicine

## 2020-04-09 ENCOUNTER — Other Ambulatory Visit: Payer: Self-pay | Admitting: Internal Medicine

## 2020-04-10 ENCOUNTER — Telehealth: Payer: Self-pay | Admitting: Family Medicine

## 2020-04-10 NOTE — Telephone Encounter (Signed)
Pt would like a referral to a Gynecologist within the [facility].   Pt can be reached at [phone]

## 2020-04-13 NOTE — Telephone Encounter (Signed)
Pt has understanding and will look into gynecologist

## 2020-04-13 NOTE — Telephone Encounter (Signed)
Please advise 

## 2020-04-13 NOTE — Telephone Encounter (Signed)
We have no Gyns in [place].  I don't think she will need a referral if she is looking to someone for general gyn care.

## 2020-04-20 ENCOUNTER — Ambulatory Visit: Payer: Medicare PPO | Admitting: Internal Medicine

## 2020-04-25 ENCOUNTER — Other Ambulatory Visit: Payer: Self-pay | Admitting: Family Medicine

## 2020-04-27 ENCOUNTER — Other Ambulatory Visit: Payer: Self-pay

## 2020-04-29 ENCOUNTER — Ambulatory Visit: Payer: Medicare PPO | Admitting: Internal Medicine

## 2020-04-29 ENCOUNTER — Encounter: Payer: Self-pay | Admitting: Internal Medicine

## 2020-04-29 ENCOUNTER — Other Ambulatory Visit: Payer: Self-pay

## 2020-04-29 VITALS — BP 118/60 | HR 74 | Temp 98.7°F | Ht 62.0 in | Wt 187.2 lb

## 2020-04-29 DIAGNOSIS — E1142 Type 2 diabetes mellitus with diabetic polyneuropathy: Secondary | ICD-10-CM | POA: Diagnosis not present

## 2020-04-29 DIAGNOSIS — E1165 Type 2 diabetes mellitus with hyperglycemia: Secondary | ICD-10-CM | POA: Diagnosis not present

## 2020-04-29 DIAGNOSIS — Z794 Long term (current) use of insulin: Secondary | ICD-10-CM

## 2020-04-29 LAB — POCT GLYCOSYLATED HEMOGLOBIN (HGB A1C): Hemoglobin A1C: 6.7 % — AB (ref 4.0–5.6)

## 2020-04-29 MED ORDER — INSULIN LISPRO (1 UNIT DIAL) 100 UNIT/ML (KWIKPEN): PEN_INJECTOR | SUBCUTANEOUS | 3 refills | Status: DC

## 2020-04-29 MED ORDER — LANTUS SOLOSTAR 100 UNIT/ML ~~LOC~~ SOPN: 30.0000 [IU] | PEN_INJECTOR | Freq: Every day | SUBCUTANEOUS | 3 refills | Status: DC

## 2020-04-29 MED ORDER — LANTUS SOLOSTAR 100 UNIT/ML ~~LOC~~ SOPN: 35.0000 [IU] | PEN_INJECTOR | Freq: Every day | SUBCUTANEOUS | 3 refills | Status: DC

## 2020-04-29 NOTE — Progress Notes (Signed)
Name: Dana Dyer  Age/ Sex: 69 y.o., female   MRN/ DOB: [id number], [birth year]     PCP: Eulas Post, MD   Reason for Endocrinology Evaluation: Type 2 Diabetes Mellitus  Initial Endocrine Consultative Visit: 10/23/18    PATIENT IDENTIFIER: Dana Dyer is a 69 y.o. female with a past medical history of HTN, T2DM, Cirrhosis, Fibromyalgia and dyslipidemia . The patient has followed with Endocrinology clinic since 10/23/18 for consultative assistance with management of Dana Dyer diabetes.  DIABETIC HISTORY:  Dana Dyer was diagnosed with T2DM in 2005. Dana Dyer used to be on SU but does not recall intolerance. Dana Dyer is intolerant to victoza and invokana. Dana Dyer was started on insulin therapy in 2009. Dana Dyer hemoglobin A1c has ranged from 7.8 % in 2012, peaking at 10.9% in 2018.   Ozempic was stopped in 06/2019 due to persistent nausea.  Switched pravastatin to crestor 09/2019  SUBJECTIVE:   During the last visit (01/21/2020): A1c was 8.0%. We continued metformin and MDI regimen   Today (04/29/2020): Dana Dyer is here for a 3 month follow up on diabetes management.  Dana Dyer checks Dana Dyer blood sugars 2 times daily. The patient has not had hypoglycemic episodes since the last clinic visit per se.  But Dana Dyer does endorse episodes of sweating in the middle of the night, Dana Dyer also endorses tight BG's in the morning in the 70s and 80s.  Otherwise, the patient has not required any recent emergency interventions for hypoglycemia and has not had recent hospitalizations secondary to hyper or hypoglycemic episodes.   Last eye exam summer 2020  Dana Dyer is having back issues  LE swelling   HOME DIABETES REGIMEN:  Lantus 45units daily- taking 35 units  Humalog 10 units with Breakfast  Humalog 18 units with lunch and supper - taking 15 units with lunch and dinner Metformin 1000 mg BID  Crestor 10 mg daily - stopped taking it 2 months ago due to myalgia     METER DOWNLOAD SUMMARY:  5/13-5/26/2021 Fingerstick Blood Glucose Tests = 29 Average Number Tests/Day = 2.1 Overall Mean FS Glucose = 129   BG Ranges: Low =54 High = 272   Hypoglycemic Events/30 Days: BG < 50 = 0 Episodes of symptomatic severe hypoglycemia =0       HISTORY:  Past Medical History:  Past Medical History:  Diagnosis Date  . ALLERGIC RHINITIS 10/09/2007  . Allergy   . Arthritis    hips, spine  . DIABETES MELLITUS, UNCONTROLLED 03/16/2009  . FATIGUE 10/21/2008  . FIBROMYALGIA 10/09/2007  . GERD (gastroesophageal reflux disease)    diet  controlled, no meds  . H/O vaginal hysterectomy   . HYPERLIPIDEMIA 10/09/2007   diet controlled, no meds  . HYPERTENSION 10/09/2007  . Morton's neuroma   . OBSTRUCTIVE SLEEP APNEA 10/09/2007  . Plantar fasciitis    History - right  . Sleep apnea    Uses CPAP  . TRANSAMINASES, SERUM, ELEVATED 07/08/2009   Past Surgical History:  Past Surgical History:  Procedure Laterality Date  . ABDOMINAL HYSTERECTOMY  1979   prolapsed uterus  . BASAL CELL CARCINOMA EXCISION    . BREAST BIOPSY     left 2012 stereo  . BREAST REDUCTION SURGERY Bilateral 1992  . COLONOSCOPY  2006   [facility]  . mamoplasty     reduction  . REDUCTION MAMMAPLASTY Bilateral   . TONSILLECTOMY  1959  . TUBAL LIGATION  1976    Social History:  reports that Dana Dyer has never smoked. Dana Dyer  has never used smokeless tobacco. Dana Dyer reports that Dana Dyer does not drink alcohol or use drugs. Family History:  Family History  Problem Relation Age of Onset  . Diabetes Mother   . Arthritis Mother   . Cancer Mother        breast  . Breast cancer Mother 80  . Alcohol abuse Father   . Arthritis Father   . Hypertension Maternal Grandfather   . Miscarriages / Stillbirths Paternal Grandfather   . Breast cancer Maternal Aunt        diagnosed in Dana Dyer 72's  . Colon polyps Neg Hx   . Colon cancer Neg Hx   . Esophageal cancer Neg Hx   . Rectal cancer Neg Hx   . Stomach cancer Neg Hx      HOME  MEDICATIONS: Allergies as of 04/29/2020      Reactions   Amoxicillin-pot Clavulanate    Severe diarrhea   Codeine Sulfate    REACTION: "hyper"   Erythromycin Base    Invokana [canagliflozin] Other (See Comments)   Recurrent yeast infections   Irbesartan-hydrochlorothiazide    Latex    Victoza [liraglutide] Nausea Only   Ozempic (0.25 Or 0.5 Mg-dose) [semaglutide(0.25 Or 0.70m-dos)] Nausea Only      Medication List       Accurate as of Apr 29, 2020  1:33 PM. If you have any questions, ask your nurse or doctor.        amLODipine 5 MG tablet Commonly known as: NORVASC TAKE 1 TABLET BY MOUTH EVERY DAY   escitalopram 10 MG tablet Commonly known as: LEXAPRO TAKE 1 TABLET BY MOUTH EVERY DAY   ferrous sulfate 325 (65 FE) MG tablet Take 325 mg by mouth daily with breakfast.   gabapentin 100 MG capsule Commonly known as: NEURONTIN Take 2 capsules (200 mg total) by mouth at bedtime.   hydrochlorothiazide 25 MG tablet Commonly known as: HYDRODIURIL TAKE 1 TABLET BY MOUTH EVERY DAY   insulin lispro 100 UNIT/ML KwikPen Commonly known as: HumaLOG KwikPen Inject 0.1 mLs (10 Units total) into the skin daily with breakfast AND 0.18 mLs (18 Units total) daily with lunch AND 0.18 mLs (18 Units total) daily with supper.   Insulin Pen Needle 32G X 4 MM Misc 1 Device by Does not apply route as directed.   Lantus SoloStar 100 UNIT/ML Solostar Pen Generic drug: insulin glargine Inject 45 Units into the skin daily. What changed: how much to take   losartan 100 MG tablet Commonly known as: COZAAR TAKE 1 TABLET BY MOUTH EVERY DAY   metFORMIN 1000 MG tablet Commonly known as: GLUCOPHAGE Take 1 tablet (1,000 mg total) by mouth 2 (two) times daily.   MISC NATURAL PRODUCTS PO Take by mouth.   moxifloxacin 0.5 % ophthalmic solution Commonly known as: VIGAMOX PLACE 1 DROP IN THE RIGHT EYE 3 TIMES DAILY   nadolol 40 MG tablet Commonly known as: CORGARD Take 1 tablet (40 mg total)  by mouth daily.   neomycin-polymyxin b-dexamethasone 3.5-10000-0.1 Oint Commonly known as: MAXITROL APPLY TO LOWER RIGHT EYELID MARGIN TWICE DAILY FOR 2 WEEKS   [facility]  Anti-fungal-#1   OneTouch Delica Plus [id number] USE TO CHECK BLOOD 3 TIMES A DAY   OneTouch Verio test strip Generic drug: glucose blood TEST 3 TIMES DAILY DX E11.9   rosuvastatin 10 MG tablet Commonly known as: CRESTOR TAKE 1 TABLET BY MOUTH EVERY DAY        OBJECTIVE:   Vital Signs: Ht  5' 2"  (1.575 m)   Wt 187 lb 3.2 oz (84.9 kg)   BMI 34.24 kg/m   Wt Readings from Last 3 Encounters:  04/29/20 187 lb 3.2 oz (84.9 kg)  03/23/20 185 lb (83.9 kg)  01/21/20 186 lb (84.4 kg)     Exam: General: Pt appears well and is in NAD  Lungs: Clear with good BS bilat with no rales, rhonchi, or wheezes  Heart: RRR with normal S1 and S2 and no gallops; no murmurs; no rub  Abdomen: Normoactive bowel sounds, soft, nontender, without masses or organomegaly palpable  Extremities: 1+ pretibial edema.  Neuro: MS is good with appropriate affect, pt is alert and Ox3   DM Foot 04/29/2020 The skin of the feet is intact without sores or ulcerations. The pedal pulses are 2+ on right and 2+ on left. The sensation is decreased  to a screening 5.07, 10 gram monofilament on the left      DATA REVIEWED:  Lab Results  Component Value Date   HGBA1C 7.7 (A) 01/21/2020   HGBA1C 8.0 (A) 09/09/2019   HGBA1C 8.4 (A) 06/06/2019   Lab Results  Component Value Date   MICROALBUR <0.7 09/09/2019   LDLCALC 126 (H) 08/27/2019   CREATININE 0.63 12/30/2019   Results for CLEA, DUBACH (MRN [id number]) as of 09/09/2019 15:44  Ref. Range 09/09/2019 09:38  Creatinine,U Latest Units: mg/dL 59.6  Microalb, Ur Latest Ref Range: 0.0 - 1.9 mg/dL <0.7  MICROALB/CREAT RATIO Latest Ref Range: 0.0 - 30.0 mg/g 1.2    ASSESSMENT / PLAN / RECOMMENDATIONS:   1) Type 2 Diabetes Mellitus, Optimally controlled,  With neuropathic complications - Most recent A1c of 6.7 %. Goal A1c < 7.0 %.    - Dana Dyer A1c continues to improve.  - Unfortunately Dana Dyer was having hypoglycemic episodes requiring reduction in insulin doses.  - Dana Dyer is intolerant to Ozempic.    MEDICATIONS: - Decrease Lantus to 30 units daily - Continue Humalog 10 units with breakfast, 15 units with Lunch and Supper  - Metformin 1000 mg BID    EDUCATION / INSTRUCTIONS:  BG monitoring instructions: Patient is instructed to check Dana Dyer blood sugars 4 times a day, before meals and bedtime.  Call [facility] if: BG persistently < 70 or > 300. . I reviewed the Rule of 15 for the treatment of hypoglycemia in detail with the patient. Literature supplied.   2) Dyslipidemia:  Dana Dyer has not been able to tolerate crestor due to myalgia, Dana Dyer has tried vitamin D but that worsened Dana Dyer constipation. Dana Dyer was advised to try taking crestor  1-3x a week.   Medication Crestor 10 mg daily      F/U in 3 months    Signed electronically by: Mack Guise, MD  [facility]  [facility] [address] [place], [place] [address] Phone: [phone] FAX: [phone]   CC: Eulas Post, [place] [address] Phone: [phone]  Fax: [phone]  Return to Endocrinology clinic as below: Future Appointments  Date Time Provider [place]  05/05/2020 11:15 AM Lyndal Pulley, DO [facility] None  05/18/2020  4:00 PM Marzetta Board, DPM [facility]

## 2020-04-29 NOTE — Patient Instructions (Signed)
-   Decrease Lantus to 30 units daily  - Continue Humalog 10 units with Breakfast and 15 units with Lunch and supper     HOW TO TREAT LOW BLOOD SUGARS (Blood sugar LESS THAN 70 MG/DL)  Please follow the RULE OF 15 for the treatment of hypoglycemia treatment (when your (blood sugars are less than 70 mg/dL)    STEP 1: Take 15 grams of carbohydrates when your blood sugar is low, which includes:   3-4 GLUCOSE TABS  OR  3-4 OZ OF JUICE OR REGULAR SODA OR  ONE TUBE OF GLUCOSE GEL     STEP 2: RECHECK blood sugar in 15 MINUTES STEP 3: If your blood sugar is still low at the 15 minute recheck --> then, go back to STEP 1 and treat AGAIN with another 15 grams of carbohydrates.

## 2020-05-05 ENCOUNTER — Ambulatory Visit: Payer: Medicare PPO | Admitting: Family Medicine

## 2020-05-18 ENCOUNTER — Ambulatory Visit: Payer: Self-pay | Admitting: Podiatry

## 2020-06-15 ENCOUNTER — Other Ambulatory Visit: Payer: Self-pay | Admitting: Family Medicine

## 2020-06-15 DIAGNOSIS — Z1231 Encounter for screening mammogram for malignant neoplasm of breast: Secondary | ICD-10-CM

## 2020-06-16 ENCOUNTER — Telehealth: Payer: Self-pay

## 2020-06-16 DIAGNOSIS — K7460 Unspecified cirrhosis of liver: Secondary | ICD-10-CM

## 2020-06-16 NOTE — Telephone Encounter (Signed)
-----   Message from Stevan Born, [place] sent at 01/07/2020 11:23 AM EST ----- Schedule 6 month follow up.  1 week prior pt needs liver ultrasound with CBC, CMET, PT/INR, and AFP.

## 2020-06-16 NOTE — Telephone Encounter (Deleted)
.  h 

## 2020-06-16 NOTE — Telephone Encounter (Addendum)
Patient advised that she will need lab work and abdominal ultrasound ATTN: liver in August per Dr Ardis Hughs.  Patient states that she will be out of town several weeks in August.  Patient prefers to schedule follow up testing in September.   Patient aware that she has been scheduled for an abdominal ultrasound at [facility] (1st floor of hospital) on 08-31-20 at 9:00am. Please arrive 15 minutes prior to your appointment for registration. Make certain not to have anything to eat or drink after midnight the night prior to your appointment. Should you need to reschedule your appointment, please contact radiology at [phone]. This test typically takes about 30 minutes to perform.   Patient advised to report to [facility] on the same day for lab work.    Patient will be contacted once October schedule is open for Dr Ardis Hughs to be scheduled for follow up appointment after ultrasound and lab work.  Patient agreed to plan and verbalized understanding.  No further questions.

## 2020-06-22 ENCOUNTER — Encounter: Payer: Self-pay | Admitting: Family Medicine

## 2020-06-29 ENCOUNTER — Other Ambulatory Visit: Payer: Self-pay | Admitting: Family Medicine

## 2020-07-02 ENCOUNTER — Other Ambulatory Visit: Payer: Self-pay | Admitting: Family Medicine

## 2020-07-03 ENCOUNTER — Other Ambulatory Visit: Payer: Self-pay | Admitting: Family Medicine

## 2020-07-03 ENCOUNTER — Other Ambulatory Visit: Payer: Self-pay | Admitting: Internal Medicine

## 2020-07-05 NOTE — Telephone Encounter (Signed)
Refill for 6 months. 

## 2020-07-10 ENCOUNTER — Other Ambulatory Visit: Payer: Self-pay

## 2020-07-10 ENCOUNTER — Ambulatory Visit: Payer: Medicare PPO | Admitting: Family Medicine

## 2020-07-10 ENCOUNTER — Encounter: Payer: Self-pay | Admitting: Family Medicine

## 2020-07-10 DIAGNOSIS — M5136 Other intervertebral disc degeneration, lumbar region: Secondary | ICD-10-CM

## 2020-07-10 DIAGNOSIS — F322 Major depressive disorder, single episode, severe without psychotic features: Secondary | ICD-10-CM

## 2020-07-10 MED ORDER — DULOXETINE HCL 20 MG PO CPEP
20.0000 mg | ORAL_CAPSULE | Freq: Every day | ORAL | 0 refills | Status: DC
Start: 2020-07-10 — End: 2020-07-31

## 2020-07-10 NOTE — Assessment & Plan Note (Signed)
Discontinue Lexapro start Cymbalta for pain relief and more coverage for fibromyalgia

## 2020-07-10 NOTE — Progress Notes (Signed)
[facility] [address] [place] Phone: [phone] Subjective:   I Dana Dyer am serving as a Education administrator for Dr. Hulan Saas.  This visit occurred during the SARS-CoV-2 public health emergency.  Safety protocols were in place, including screening questions prior to the visit, additional usage of staff PPE, and extensive cleaning of exam room while observing appropriate contact time as indicated for disinfecting solutions.   I'm seeing this patient by the request  of:  Eulas Post, MD  CC: Low back pain  [id number]   03/23/2020 Degenerative disc disease likely causing some spinal stenosis.  Patient wants to try conservative therapy and given gabapentin, home exercise, icing regimen, discussed which activities to do which wants to avoid.  Patient will increase activity slowly over the course of the next several weeks.  Patient has had different other problems such as cirrhosis and will be careful with other medications.  Follow-up again in 4 to 8 weeks  Update 07/10/2020 Dana Dyer is a 69 y.o. female coming in with complaint of right hip pain. Patient states she is not doing well. Walking and other ADLs are painful. States her spin feels "casted in cement". Patient states gabapentin makes her ankles swollen.  Patient has had a lot of family issues recently.  Is having increasing discomfort and pain.  Patient has also had unfortunately a lot of other stressors in her life.  Feels that the Prozac she has been put on for some of her anxiety and fibromyalgia is not helping and is wondering what else can be done.      Past Medical History:  Diagnosis Date  . ALLERGIC RHINITIS 10/09/2007  . Allergy   . Arthritis    hips, spine  . DIABETES MELLITUS, UNCONTROLLED 03/16/2009  . FATIGUE 10/21/2008  . FIBROMYALGIA 10/09/2007  . GERD (gastroesophageal reflux disease)    diet  controlled, no meds  . H/O vaginal hysterectomy   .  HYPERLIPIDEMIA 10/09/2007   diet controlled, no meds  . HYPERTENSION 10/09/2007  . Morton's neuroma   . OBSTRUCTIVE SLEEP APNEA 10/09/2007  . Plantar fasciitis    History - right  . Sleep apnea    Uses CPAP  . TRANSAMINASES, SERUM, ELEVATED 07/08/2009   Past Surgical History:  Procedure Laterality Date  . ABDOMINAL HYSTERECTOMY  1979   prolapsed uterus  . BASAL CELL CARCINOMA EXCISION    . BREAST BIOPSY     left 2012 stereo  . BREAST REDUCTION SURGERY Bilateral 1992  . COLONOSCOPY  2006   [facility]  . mamoplasty     reduction  . REDUCTION MAMMAPLASTY Bilateral   . TONSILLECTOMY  1959  . TUBAL LIGATION  1976   Social History   Socioeconomic History  . Marital status: Married    Spouse name: Not on file  . Number of children: Not on file  . Years of education: Not on file  . Highest education level: Not on file  Occupational History  . Not on file  Tobacco Use  . Smoking status: Never Smoker  . Smokeless tobacco: Never Used  Vaping Use  . Vaping Use: Never used  Substance and Sexual Activity  . Alcohol use: No    Alcohol/week: 0.0 standard drinks  . Drug use: No  . Sexual activity: Yes    Birth control/protection: Other-see comments, Post-menopausal    Comment: Hysterectomy  Other Topics Concern  . Not on file  Social History Narrative  . Not on  file   Social Determinants of Health   Financial Resource Strain:   . Difficulty of Paying Living Expenses:   Food Insecurity:   . Worried About Charity fundraiser in the Last Year:   . Arboriculturist in the Last Year:   Transportation Needs:   . Film/video editor (Medical):   Marland Kitchen Lack of Transportation (Non-Medical):   Physical Activity:   . Days of Exercise per Week:   . Minutes of Exercise per Session:   Stress:   . Feeling of Stress :   Social Connections:   . Frequency of Communication with Friends and Family:   . Frequency of Social Gatherings with Friends and Family:   . Attends Religious Services:     . Active Member of Clubs or Organizations:   . Attends Archivist Meetings:   Marland Kitchen Marital Status:    Allergies  Allergen Reactions  . Amoxicillin-Pot Clavulanate     Severe diarrhea  . Codeine Sulfate     REACTION: "hyper"  . Erythromycin Base   . Invokana [Canagliflozin] Other (See Comments)    Recurrent yeast infections  . Irbesartan-Hydrochlorothiazide   . Latex   . Victoza [Liraglutide] Nausea Only  . Ozempic (0.25 Or 0.5 Mg-Dose) [Semaglutide(0.25 Or 0.59m-Dos)] Nausea Only   Family History  Problem Relation Age of Onset  . Diabetes Mother   . Arthritis Mother   . Cancer Mother        breast  . Breast cancer Mother 748 . Alcohol abuse Father   . Arthritis Father   . Hypertension Maternal Grandfather   . Miscarriages / Stillbirths Paternal Grandfather   . Breast cancer Maternal Aunt        diagnosed in her 74's . Colon polyps Neg Hx   . Colon cancer Neg Hx   . Esophageal cancer Neg Hx   . Rectal cancer Neg Hx   . Stomach cancer Neg Hx     Current Outpatient Medications (Endocrine & Metabolic):  .  insulin glargine (LANTUS SOLOSTAR) 100 UNIT/ML Solostar Pen, Inject 30 Units into the skin daily. .  insulin lispro (HUMALOG KWIKPEN) 100 UNIT/ML KwikPen, Inject 0.1 mLs (10 Units total) into the skin daily with breakfast AND 0.15 mLs (15 Units total) daily with lunch AND 0.15 mLs (15 Units total) daily with supper. .  metFORMIN (GLUCOPHAGE) 1000 MG tablet, TAKE 1 TABLET BY MOUTH TWICE A DAY   Current Outpatient Medications (Cardiovascular):  .  amLODipine (NORVASC) 5 MG tablet, TAKE 1 TABLET BY MOUTH EVERY DAY .  hydrochlorothiazide (HYDRODIURIL) 25 MG tablet, TAKE 1 TABLET BY MOUTH EVERY DAY .  losartan (COZAAR) 100 MG tablet, TAKE 1 TABLET BY MOUTH EVERY DAY .  nadolol (CORGARD) 40 MG tablet, Take 1 tablet (40 mg total) by mouth daily. .  rosuvastatin (CRESTOR) 10 MG tablet, TAKE 1 TABLET BY MOUTH EVERY DAY       Current Outpatient Medications  (Hematological):  .  ferrous sulfate 325 (65 FE) MG tablet, Take 325 mg by mouth daily with breakfast.   Current Outpatient Medications (Other):  .  escitalopram (LEXAPRO) 10 MG tablet, TAKE 1 TABLET BY MOUTH EVERY DAY .  gabapentin (NEURONTIN) 100 MG capsule, Take 2 capsules (200 mg total) by mouth at bedtime. .  Insulin Pen Needle 32G X 4 MM MISC, 1 Device by Does not apply route as directed. .  Lancets (ONETOUCH DELICA PLUS [id number] MISC, USE TO CHECK BLOOD 3 TIMES A DAY .  MISC NATURAL PRODUCTS PO, Take by mouth. .  moxifloxacin (VIGAMOX) 0.5 % ophthalmic solution, PLACE 1 DROP IN THE RIGHT EYE 3 TIMES DAILY .  neomycin-polymyxin b-dexamethasone (MAXITROL) 3.5-10000-0.1 OINT, APPLY TO LOWER RIGHT EYELID MARGIN TWICE DAILY FOR 2 WEEKS .  NON FORMULARY, [employer]  Anti-fungal-#1 .  ONETOUCH VERIO test strip, TEST 3 TIMES DAILY DX E11.9 .  DULoxetine (CYMBALTA) 20 MG capsule, Take 1 capsule (20 mg total) by mouth daily.  Current Facility-Administered Medications (Other):  Marland Kitchen  TDaP (BOOSTRIX) injection 0.5 mL   Reviewed prior external information including notes and imaging from  primary care provider As well as notes that were available from care everywhere and other healthcare systems.  Past medical history, social, surgical and family history all reviewed in electronic medical record.  No pertanent information unless stated regarding to the chief complaint.   Review of Systems:  No headache, visual changes, nausea, vomiting, diarrhea, constipation, dizziness, abdominal pain, skin rash, fevers, chills, night sweats, weight loss, swollen lymph nodes, , joint swelling, chest pain, shortness of breath,  POSITIVE muscle aches, body aches, mood changes with more anxiety recently  Objective  Blood pressure 130/74, pulse 78, height 5' 2"  (1.575 m), weight 181 lb (82.1 kg), SpO2 98 %.   General: No apparent distress alert and oriented x3 mood and affect normal, dressed  appropriately.  HEENT: Pupils equal, extraocular movements intact  Respiratory: Patient's speak in full sentences and does not appear short of breath  Cardiovascular: No lower extremity edema, non tender, no erythema  Neuro: Cranial nerves II through XII are intact, neurovascularly intact in all extremities with 2+ DTRs and 2+ pulses.  Gait mild antalgic MSK: Arthritic changes of multiple joints. Patient does have tenderness to palpation of the paraspinal musculature.  Decreased range of motion in all planes.  Patient does have involuntary involuntary guarding noted.  No spinous process tenderness though noted today.   Impression and Recommendations:     The above documentation has been reviewed and is accurate and complete Lyndal Pulley, DO       Note: This dictation was prepared with Dragon dictation along with smaller phrase technology. Any transcriptional errors that result from this process are unintentional.

## 2020-07-10 NOTE — Assessment & Plan Note (Signed)
Patient does have degenerative disc disease of the lumbar spine.  Moderate in nature.  Patient though is not made any improvement and actually worsening for this chronic condition.  Patient will be sent to formal physical therapy that I think will be beneficial.  This will give her time to actually get better.  I do believe there is some underlying anxiety that is playing a role and encouraged her to discontinue the Lexapro and only be on the Cymbalta.  We will check with primary care provider but I think this will be beneficial.  She has had some allergies to medications and we will monitor.  Patient will follow up with me again in 6 weeks.  Total time with patient 33 minutes after discussing her other stressors

## 2020-07-10 NOTE — Patient Instructions (Signed)
Good to see you  Ice is your friend  PT at [facility] will call you  Stop prozac Start cymbalta 20 mg and see if it helps See me again 6-8 weeks

## 2020-07-14 ENCOUNTER — Other Ambulatory Visit: Payer: Self-pay

## 2020-07-14 ENCOUNTER — Ambulatory Visit
Admission: RE | Admit: 2020-07-14 | Discharge: 2020-07-14 | Disposition: A | Discharge status: Home / Self Care | Payer: Medicare PPO | Source: Ambulatory Visit | Attending: Family Medicine | Admitting: Family Medicine

## 2020-07-14 DIAGNOSIS — Z1231 Encounter for screening mammogram for malignant neoplasm of breast: Secondary | ICD-10-CM | POA: Diagnosis not present

## 2020-07-22 ENCOUNTER — Other Ambulatory Visit: Payer: Self-pay | Admitting: Internal Medicine

## 2020-07-28 ENCOUNTER — Encounter: Payer: Self-pay | Admitting: Podiatry

## 2020-07-28 ENCOUNTER — Other Ambulatory Visit: Payer: Self-pay

## 2020-07-28 ENCOUNTER — Ambulatory Visit: Payer: Medicare PPO | Admitting: Podiatry

## 2020-07-28 DIAGNOSIS — M79675 Pain in left toe(s): Secondary | ICD-10-CM

## 2020-07-28 DIAGNOSIS — E1142 Type 2 diabetes mellitus with diabetic polyneuropathy: Secondary | ICD-10-CM | POA: Diagnosis not present

## 2020-07-28 DIAGNOSIS — B351 Tinea unguium: Secondary | ICD-10-CM | POA: Diagnosis not present

## 2020-07-28 DIAGNOSIS — M792 Neuralgia and neuritis, unspecified: Secondary | ICD-10-CM

## 2020-07-28 DIAGNOSIS — L600 Ingrowing nail: Secondary | ICD-10-CM | POA: Diagnosis not present

## 2020-07-28 DIAGNOSIS — M79674 Pain in right toe(s): Secondary | ICD-10-CM | POA: Diagnosis not present

## 2020-07-28 NOTE — Patient Instructions (Addendum)
Diabetic Neuropathy Diabetic neuropathy refers to nerve damage that is caused by diabetes (diabetes mellitus). Over time, people with diabetes can develop nerve damage throughout the body. There are several types of diabetic neuropathy:  Peripheral neuropathy. This is the most common type of diabetic neuropathy. It causes damage to nerves that carry signals between the spinal cord and other parts of the body (peripheral nerves). This usually affects nerves in the feet and legs first, and may eventually affect the hands and arms. The damage affects the ability to sense touch or temperature.  Autonomic neuropathy. This type causes damage to nerves that control involuntary functions (autonomic nerves). These nerves carry signals that control: ? Heartbeat. ? Body temperature. ? Blood pressure. ? Urination. ? Digestion. ? Sweating. ? Sexual function. ? Response to changing blood sugar (glucose) levels.  Focal neuropathy. This type of nerve damage affects one area of the body, such as an arm, a leg, or the face. The injury may involve one nerve or a small group of nerves. Focal neuropathy can be painful and unpredictable, and occurs most often in older adults with diabetes. This often develops suddenly, but usually improves over time and does not cause long-term problems.  Proximal neuropathy. This type of nerve damage affects the nerves of the thighs, hips, buttocks, or legs. It causes severe pain, weakness, and muscle death (atrophy), usually in the thigh muscles. It is more common among older men and people who have type 2 diabetes. The length of recovery time may vary. What are the causes? Peripheral, autonomic, and focal neuropathies are caused by diabetes that is not well controlled with treatment. The cause of proximal neuropathy is not known, but it may be caused by inflammation related to uncontrolled blood glucose levels. What are the signs or symptoms? Peripheral neuropathy Peripheral  neuropathy develops slowly over time. When the nerves of the feet and legs no longer work, you may experience:  Burning, stabbing, or aching pain in the legs or feet.  Pain or cramping in the legs or feet.  Loss of feeling (numbness) and inability to feel pressure or pain in the feet. This can lead to: ? Thick calluses or sores on areas of constant pressure. ? Ulcers. ? Reduced ability to feel temperature changes.  Foot deformities.  Muscle weakness.  Loss of balance or coordination. Autonomic neuropathy The symptoms of autonomic neuropathy vary depending on which nerves are affected. Symptoms may include:  Problems with digestion, such as: ? Nausea or vomiting. ? Poor appetite. ? Bloating. ? Diarrhea or constipation. ? Trouble swallowing. ? Losing weight without trying to.  Problems with the heart, blood and lungs, such as: ? Dizziness, especially when standing up. ? Fainting. ? Shortness of breath. ? Irregular heartbeat.  Bladder problems, such as: ? Trouble starting or stopping urination. ? Leaking urine. ? Trouble emptying the bladder. ? Urinary tract infections (UTIs).  Problems with other body functions, such as: ? Sweat. You may sweat too much or too little. ? Temperature. You might get hot easily. Or, you might feel cold more than usual. ? Sexual function. Men may not be able to get or maintain an erection. Women may have vaginal dryness and difficulty with arousal. Focal neuropathy Symptoms affect only one area of the body. Common symptoms include:  Numbness.  Tingling.  Burning pain.  Prickling feeling.  Very sensitive skin.  Weakness.  Inability to move (paralysis).  Muscle twitching.  Muscles getting smaller (wasting).  Poor coordination.  Double or blurred vision. Proximal  neuropathy  Sudden, severe pain in the hip, thigh, or buttocks. Pain may spread from the back into the legs (sciatica).  Pain and numbness in the arms and  legs.  Tingling.  Loss of bladder control or bowel control.  Weakness and wasting of thigh muscles.  Difficulty getting up from a seated position.  Abdominal swelling.  Unexplained weight loss. How is this diagnosed? Diagnosis usually involves reviewing your medical history and any symptoms you have. Diagnosis varies depending on the type of neuropathy your health care provider suspects. Peripheral neuropathy Your health care provider will check areas that are affected by your nervous system (neurologic exam), such as your reflexes, how you move, and what you can feel. You may have other tests, such as:  Blood tests.  Removal and examination of fluid that surrounds the spinal cord (lumbar puncture).  CT scan.  MRI.  A test to check the nerves that control muscles (electromyogram, EMG).  Tests of how quickly messages pass through your nerves (nerve conduction velocity tests).  Removal of a small piece of nerve to be examined under a microscope (biopsy). Autonomic neuropathy You may have tests, such as:  Tests to measure your blood pressure and heart rate. This may include monitoring you while you are safely secured to an exam table that moves you from a lying position to an upright position (table tilt test).  Breathing tests to check your lungs.  Tests to check how food moves through the digestive system (gastric emptying tests).  Blood, sweat, or urine tests.  Ultrasound of your bladder.  Spinal fluid tests. Focal neuropathy This condition may be diagnosed with:  A neurologic exam.  CT scan.  MRI.  EMG.  Nerve conduction velocity tests. Proximal neuropathy There is no test to diagnose this type of neuropathy. You may have tests to rule out other possible causes of this type of neuropathy. Tests may include:  X-rays of your spine and lumbar region.  Lumbar puncture.  MRI. How is this treated? The goal of treatment is to keep nerve damage from getting  worse. The most important part of treatment is keeping your blood glucose level and your A1C level within your target range by following your diabetes management plan. Over time, maintaining lower blood glucose levels helps lessen symptoms. In some cases, you may need prescription pain medicine. Follow these instructions at home:  Lifestyle   Do not use any products that contain nicotine or tobacco, such as cigarettes and e-cigarettes. If you need help quitting, ask your health care provider.  Be physically active every day. Include strength training and balance exercises.  Follow a healthy meal plan.  Work with your health care provider to manage your blood pressure. General instructions  Follow your diabetes management plan as directed. ? Check your blood glucose levels as directed by your health care provider. ? Keep your blood glucose in your target range as directed by your health care provider. ? Have your A1C level checked at least two times a year, or as often as told by your health care provider.  Take over the counter and prescription medicines only as told by your health care provider. This includes insulin and diabetes medicine.  Do not drive or use heavy machinery while taking prescription pain medicines.  Check your skin and feet every day for cuts, bruises, redness, blisters, or sores.  Keep all follow up visits as told by your health care provider. This is important. Contact a health care provider if:  You have burning, stabbing, or aching pain in your legs or feet.  You are unable to feel pressure or pain in your feet.  You develop problems with digestion, such as: ? Nausea. ? Vomiting. ? Bloating. ? Constipation. ? Diarrhea. ? Abdominal pain.  You have difficulty with urination, such as inability: ? To control when you urinate (incontinence). ? To completely empty the bladder (retention).  You have palpitations.  You feel dizzy, weak, or faint when you  stand up. Get help right away if:  You cannot urinate.  You have sudden weakness or loss of coordination.  You have trouble speaking.  You have pain or pressure in your chest.  You have an irregular heart beat.  You have sudden inability to move a part of your body. Summary  Diabetic neuropathy refers to nerve damage that is caused by diabetes. It can affect nerves throughout the entire body, causing numbness and pain in the arms, legs, digestive tract, heart, and other body systems.  Keep your blood glucose level and your blood pressure in your target range, as directed by your health care provider. This can help prevent neuropathy from getting worse.  Check your skin and feet every day for cuts, bruises, redness, blisters, or sores.  Do not use any products that contain nicotine or tobacco, such as cigarettes and e-cigarettes. If you need help quitting, ask your health care provider. This information is not intended to replace advice given to you by your health care provider. Make sure you discuss any questions you have with your health care provider. Document Revised: 01/03/2018 Document Reviewed: 12/26/2016 Elsevier Patient Education  [place].  Diabetes Mellitus and [place] care is an important part of your health, especially when you have diabetes. Diabetes may cause you to have problems because of poor blood flow (circulation) to your feet and legs, which can cause your skin to:  Become thinner and drier.  Break more easily.  Heal more slowly.  Peel and crack. You may also have nerve damage (neuropathy) in your legs and feet, causing decreased feeling in them. This means that you may not notice minor injuries to your feet that could lead to more serious problems. Noticing and addressing any potential problems early is the best way to prevent future foot problems. How to care for your feet Foot hygiene  Wash your feet daily with warm water and mild  soap. Do not use hot water. Then, pat your feet and the areas between your toes until they are completely dry. Do not soak your feet as this can dry your skin.  Trim your toenails straight across. Do not dig under them or around the cuticle. File the edges of your nails with an emery board or nail file.  Apply a moisturizing lotion or petroleum jelly to the skin on your feet and to dry, brittle toenails. Use lotion that does not contain alcohol and is unscented. Do not apply lotion between your toes. Shoes and socks  Wear clean socks or stockings every day. Make sure they are not too tight. Do not wear knee-high stockings since they may decrease blood flow to your legs.  Wear shoes that fit properly and have enough cushioning. Always look in your shoes before you put them on to be sure there are no objects inside.  To break in new shoes, wear them for just a few hours a day. This prevents injuries on your feet. Wounds, scrapes, corns, and calluses  Check your  feet daily for blisters, cuts, bruises, sores, and redness. If you cannot see the bottom of your feet, use a mirror or ask someone for help.  Do not cut corns or calluses or try to remove them with medicine.  If you find a minor scrape, cut, or break in the skin on your feet, keep it and the skin around it clean and dry. You may clean these areas with mild soap and water. Do not clean the area with peroxide, alcohol, or iodine.  If you have a wound, scrape, corn, or callus on your foot, look at it several times a day to make sure it is healing and not infected. Check for: ? Redness, swelling, or pain. ? Fluid or blood. ? Warmth. ? Pus or a bad smell. General instructions  Do not cross your legs. This may decrease blood flow to your feet.  Do not use heating pads or hot water bottles on your feet. They may burn your skin. If you have lost feeling in your feet or legs, you may not know this is happening until it is too  late.  Protect your feet from hot and cold by wearing shoes, such as at the beach or on hot pavement.  Schedule a complete foot exam at least once a year (annually) or more often if you have foot problems. If you have foot problems, report any cuts, sores, or bruises to your health care provider immediately. Contact a health care provider if:  You have a medical condition that increases your risk of infection and you have any cuts, sores, or bruises on your feet.  You have an injury that is not healing.  You have redness on your legs or feet.  You feel burning or tingling in your legs or feet.  You have pain or cramps in your legs and feet.  Your legs or feet are numb.  Your feet always feel cold.  You have pain around a toenail. Get help right away if:  You have a wound, scrape, corn, or callus on your foot and: ? You have pain, swelling, or redness that gets worse. ? You have fluid or blood coming from the wound, scrape, corn, or callus. ? Your wound, scrape, corn, or callus feels warm to the touch. ? You have pus or a bad smell coming from the wound, scrape, corn, or callus. ? You have a fever. ? You have a red line going up your leg. Summary  Check your feet every day for cuts, sores, red spots, swelling, and blisters.  Moisturize feet and legs daily.  Wear shoes that fit properly and have enough cushioning.  If you have foot problems, report any cuts, sores, or bruises to your health care provider immediately.  Schedule a complete foot exam at least once a year (annually) or more often if you have foot problems. This information is not intended to replace advice given to you by your health care provider. Make sure you discuss any questions you have with your health care provider. Document Revised: 08/14/2019 Document Reviewed: 12/23/2016 Elsevier Patient Education  [place]  A bunion is a bump on the base of the big toe that forms when the bones  of the big toe joint move out of position. Bunions may be small at first, but they often get larger over time. They can make walking painful. What are the causes? A bunion may be caused by:  Wearing narrow or pointed shoes that force the  big toe to press against the other toes.  Abnormal foot development that causes the foot to roll inward (pronate).  Changes in the foot that are caused by certain diseases, such as rheumatoid arthritis or polio.  A foot injury. What increases the risk? The following factors may make you more likely to develop this condition:  Wearing shoes that squeeze the toes together.  Having certain diseases, such as: ? Rheumatoid arthritis. ? Polio. ? Cerebral palsy.  Having family members who have bunions.  Being born with a foot deformity, such as flat feet or low arches.  Doing activities that put a lot of pressure on the feet, such as ballet dancing. What are the signs or symptoms? The main symptom of a bunion is a noticeable bump on the big toe. Other symptoms may include:  Pain.  Swelling around the big toe.  Redness and inflammation.  Thick or hardened skin on the big toe or between the toes.  Stiffness or loss of motion in the big toe.  Trouble with walking. How is this diagnosed? A bunion may be diagnosed based on your symptoms, medical history, and activities. You may have tests, such as:  X-rays. These allow your health care provider to check the position of the bones in your foot and look for damage to your joint. They also help your health care provider determine the severity of your bunion and the best way to treat it.  Joint aspiration. In this test, a sample of fluid is removed from the toe joint. This test may be done if you are in a lot of pain. It helps rule out diseases that cause painful swelling of the joints, such as arthritis. How is this treated? Treatment depends on the severity of your symptoms. The goal of treatment is  to relieve symptoms and prevent the bunion from getting worse. Your health care provider may recommend:  Wearing shoes that have a wide toe box.  Using bunion pads to cushion the affected area.  Taping your toes together to keep them in a normal position.  Placing a device inside your shoe (orthotics) to help reduce pressure on your toe joint.  Taking medicine to ease pain, inflammation, and swelling.  Applying heat or ice to the affected area.  Doing stretching exercises.  Surgery to remove scar tissue and move the toes back into their normal position. This treatment is rare. Follow these instructions at home: Managing pain, stiffness, and swelling   If directed, put ice on the painful area: ? Put ice in a plastic bag. ? Place a towel between your skin and the bag. ? Leave the ice on for 20 minutes, 2-3 times a day. Activity   If directed, apply heat to the affected area before you exercise. Use the heat source that your health care provider recommends, such as a moist heat pack or a heating pad. ? Place a towel between your skin and the heat source. ? Leave the heat on for 20-30 minutes. ? Remove the heat if your skin turns bright red. This is especially important if you are unable to feel pain, heat, or cold. You may have a greater risk of getting burned.  Do exercises as told by your health care provider. General instructions  Support your toe joint with proper footwear, shoe padding, or taping as told by your health care provider.  Take over-the-counter and prescription medicines only as told by your health care provider.  Keep all follow-up visits as told by  your health care provider. This is important. Contact a health care provider if your symptoms:  Get worse.  Do not improve in 2 weeks. Get help right away if you have:  Severe pain and trouble with walking. Summary  A bunion is a bump on the base of the big toe that forms when the bones of the big toe  joint move out of position.  Bunions can make walking painful.  Treatment depends on the severity of your symptoms.  Support your toe joint with proper footwear, shoe padding, or taping as told by your health care provider. This information is not intended to replace advice given to you by your health care provider. Make sure you discuss any questions you have with your health care provider. Document Revised: 05/28/2018 Document Reviewed: 04/03/2018 Elsevier Patient Education  [place].   Peripheral Neuropathy Peripheral neuropathy is a type of nerve damage. It affects nerves that carry signals between the spinal cord and the arms, legs, and the rest of the body (peripheral nerves). It does not affect nerves in the spinal cord or brain. In peripheral neuropathy, one nerve or a group of nerves may be damaged. Peripheral neuropathy is a broad category that includes many specific nerve disorders, like diabetic neuropathy, hereditary neuropathy, and carpal tunnel syndrome. What are the causes? This condition may be caused by:  Diabetes. This is the most common cause of peripheral neuropathy.  Nerve injury.  Pressure or stress on a nerve that lasts a long time.  Lack (deficiency) of B vitamins. This can result from alcoholism, poor diet, or a restricted diet.  Infections.  Autoimmune diseases, such as rheumatoid arthritis and systemic lupus erythematosus.  Nerve diseases that are passed from parent to child (inherited).  Some medicines, such as cancer medicines (chemotherapy).  Poisonous (toxic) substances, such as lead and mercury.  Too little blood flowing to the legs.  Kidney disease.  Thyroid disease. In some cases, the cause of this condition is not known. What are the signs or symptoms? Symptoms of this condition depend on which of your nerves is damaged. Common symptoms include:  Loss of feeling (numbness) in the feet, hands, or both.  Tingling in the feet,  hands, or both.  Burning pain.  Very sensitive skin.  Weakness.  Not being able to move a part of the body (paralysis).  Muscle twitching.  Clumsiness or poor coordination.  Loss of balance.  Not being able to control your bladder.  Feeling dizzy.  Sexual problems. How is this diagnosed? Diagnosing and finding the cause of peripheral neuropathy can be difficult. Your health care provider will take your medical history and do a physical exam. A neurological exam will also be done. This involves checking things that are affected by your brain, spinal cord, and nerves (nervous system). For example, your health care provider will check your reflexes, how you move, and what you can feel. You may have other tests, such as:  Blood tests.  Electromyogram (EMG) and nerve conduction tests. These tests check nerve function and how well the nerves are controlling the muscles.  Imaging tests, such as CT scans or MRI to rule out other causes of your symptoms.  Removing a small piece of nerve to be examined in a lab (nerve biopsy). This is rare.  Removing and examining a small amount of the fluid that surrounds the brain and spinal cord (lumbar puncture). This is rare. How is this treated? Treatment for this condition may involve:  Treating  the underlying cause of the neuropathy, such as diabetes, kidney disease, or vitamin deficiencies.  Stopping medicines that can cause neuropathy, such as chemotherapy.  Medicine to relieve pain. Medicines may include: ? Prescription or over-the-counter pain medicine. ? Antiseizure medicine. ? Antidepressants. ? Pain-relieving patches that are applied to painful areas of skin.  Surgery to relieve pressure on a nerve or to destroy a nerve that is causing pain.  Physical therapy to help improve movement and balance.  Devices to help you move around (assistive devices). Follow these instructions at home: Medicines  Take over-the-counter and  prescription medicines only as told by your health care provider. Do not take any other medicines without first asking your health care provider.  Do not drive or use heavy machinery while taking prescription pain medicine. Lifestyle   Do not use any products that contain nicotine or tobacco, such as cigarettes and e-cigarettes. Smoking keeps blood from reaching damaged nerves. If you need help quitting, ask your health care provider.  Avoid or limit alcohol. Too much alcohol can cause a vitamin B deficiency, and vitamin B is needed for healthy nerves.  Eat a healthy diet. This includes: ? Eating foods that are high in fiber, such as fresh fruits and vegetables, whole grains, and beans. ? Limiting foods that are high in fat and processed sugars, such as fried or sweet foods. General instructions   If you have diabetes, work closely with your health care provider to keep your blood sugar under control.  If you have numbness in your feet: ? Check every day for signs of injury or infection. Watch for redness, warmth, and swelling. ? Wear padded socks and comfortable shoes. These help protect your feet.  Develop a good support system. Living with peripheral neuropathy can be stressful. Consider talking with a mental health specialist or joining a support group.  Use assistive devices and attend physical therapy as told by your health care provider. This may include using a walker or a cane.  Keep all follow-up visits as told by your health care provider. This is important. Contact a health care provider if:  You have new signs or symptoms of peripheral neuropathy.  You are struggling emotionally from dealing with peripheral neuropathy.  Your pain is not well-controlled. Get help right away if:  You have an injury or infection that is not healing normally.  You develop new weakness in an arm or leg.  You fall frequently. Summary  Peripheral neuropathy is when the nerves in the  arms, or legs are damaged, resulting in numbness, weakness, or pain.  There are many causes of peripheral neuropathy, including diabetes, pinched nerves, vitamin deficiencies, autoimmune disease, and hereditary conditions.  Diagnosing and finding the cause of peripheral neuropathy can be difficult. Your health care provider will take your medical history, do a physical exam, and do tests, including blood tests and nerve function tests.  Treatment involves treating the underlying cause of the neuropathy and taking medicines to help control pain. Physical therapy and assistive devices may also help. This information is not intended to replace advice given to you by your health care provider. Make sure you discuss any questions you have with your health care provider. Document Revised: 11/03/2017 Document Reviewed: 01/30/2017 Elsevier Patient Education  2020 [employer].

## 2020-07-29 IMAGING — DX DG HIP (WITH OR WITHOUT PELVIS) INFANT 2-3V*L*
2 series · 2 of 2 positions shown · non-contrast
Comparison: None.

CLINICAL DATA: Low back and right hip pain for 6 weeks. No known
injury.

EXAM:
DG HIP (WITH OR WITHOUT PELVIS) INFANT 2-3V LEFT

[hip ap]
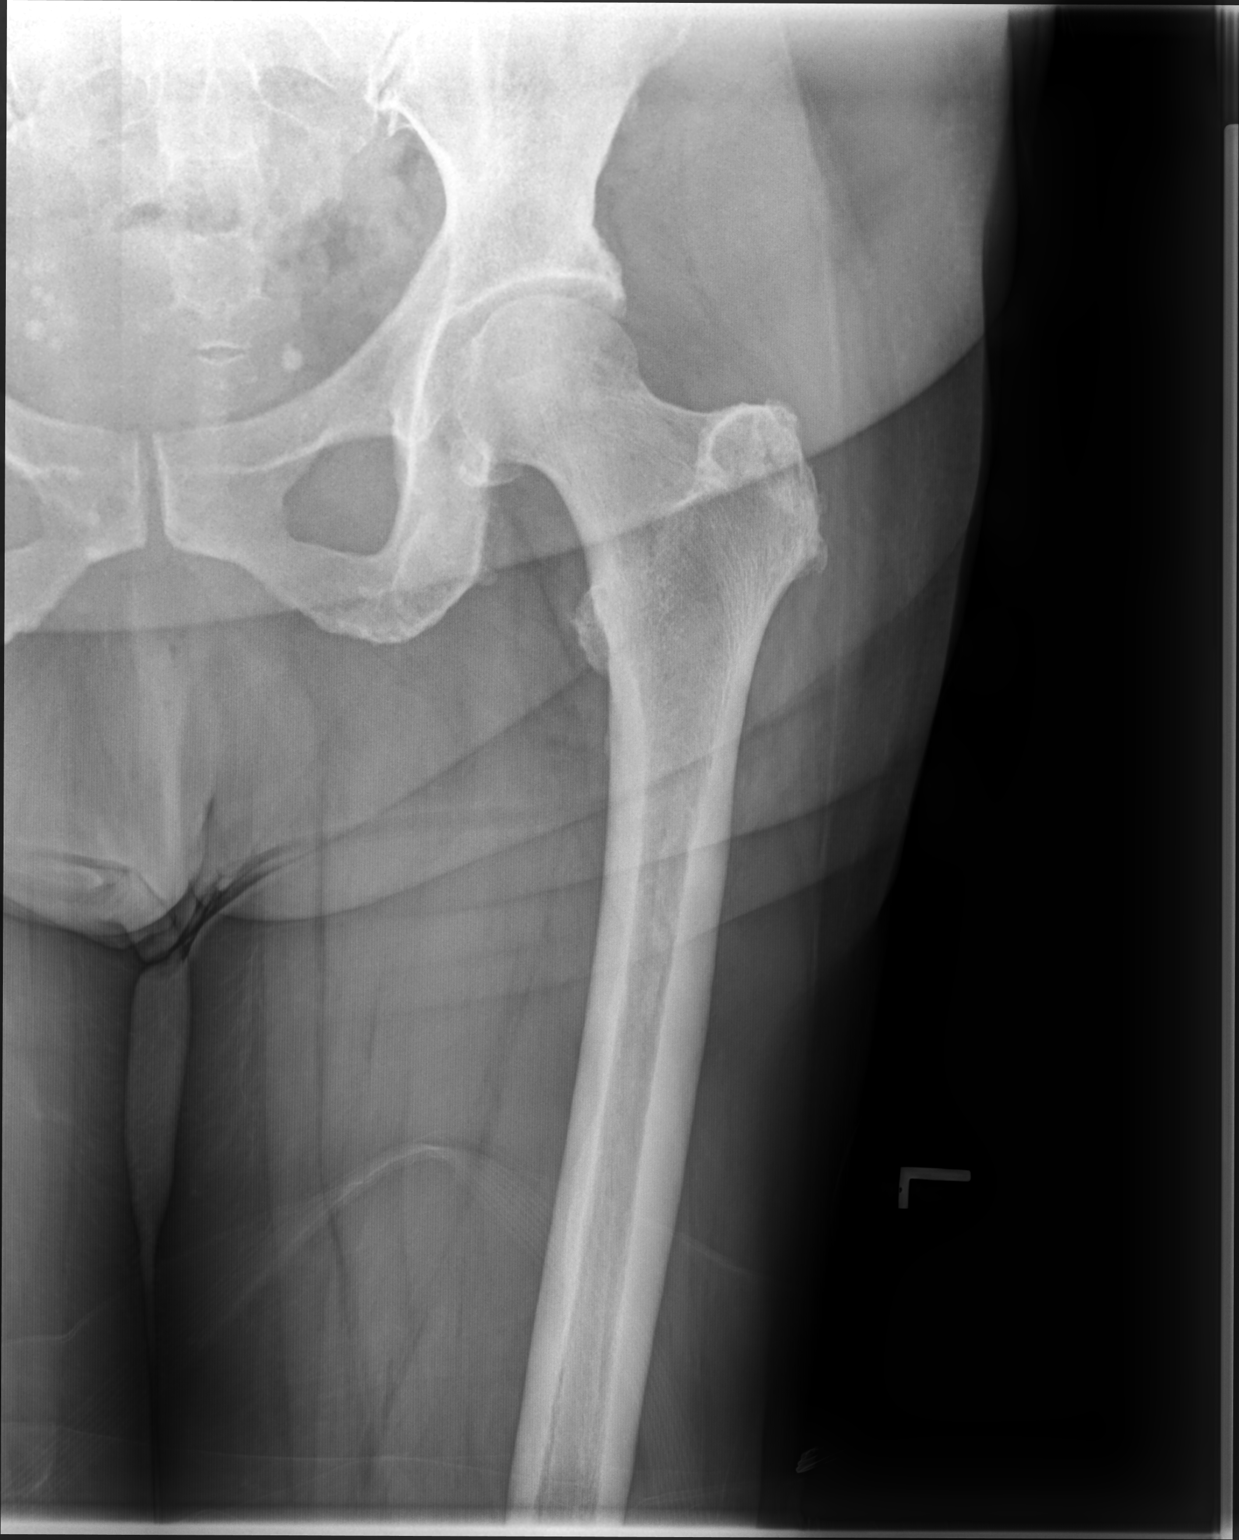

[hip (frog leg)]
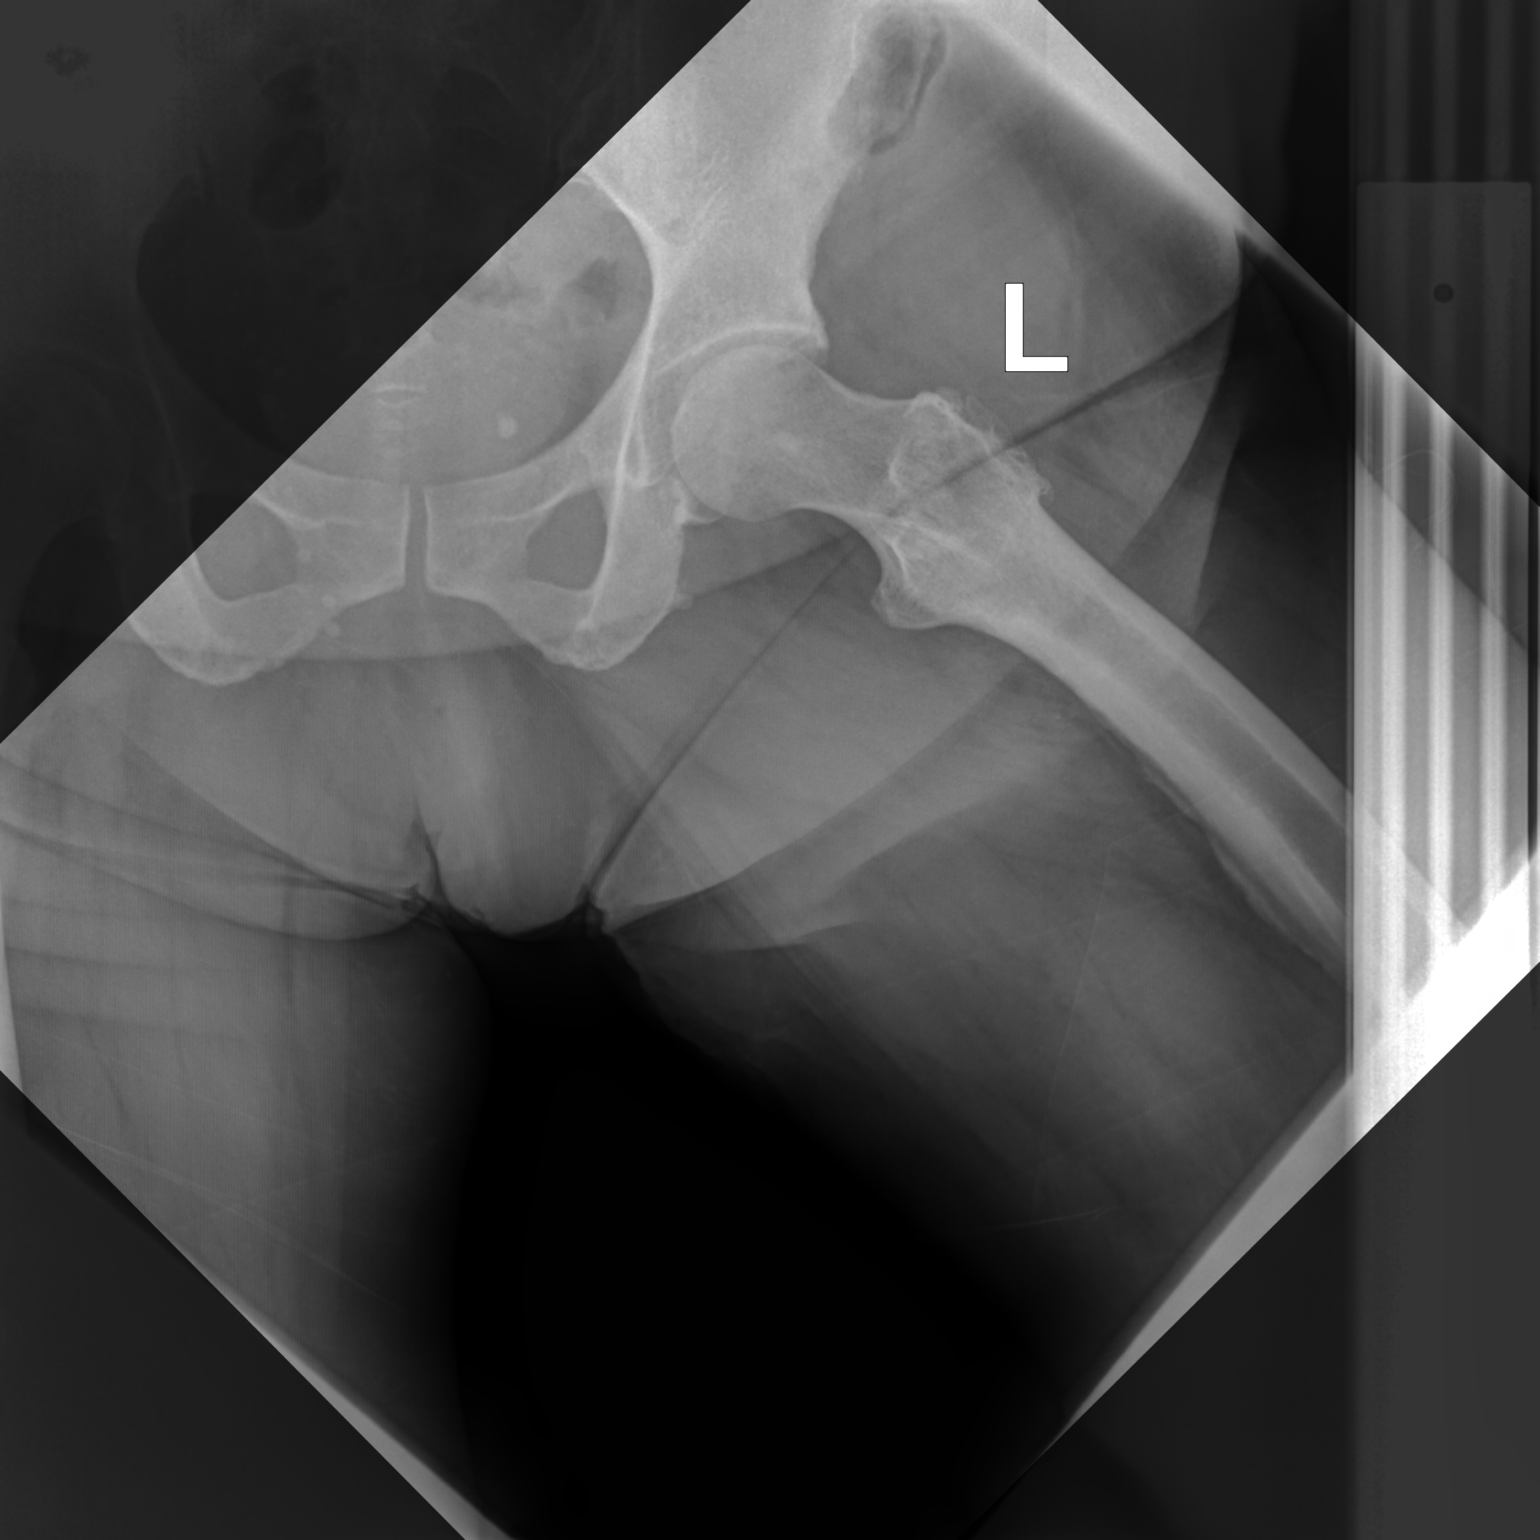

[2 of 2 positions shown; findings below may reference images not displayed]

FINDINGS: AP and frog-leg lateral views of the LEFT hip. Despite the provided
history, these appear to be views of the LEFT hip and are such
labeled.

The mineralization and alignment are normal. There is no evidence of
acute fracture or dislocation. No evidence of femoral head avascular
necrosis or joint space narrowing. Mild spurring of the greater
trochanter.
IMPRESSION: No acute osseous findings or significant arthropathic changes.

## 2020-07-29 NOTE — Progress Notes (Signed)
Subjective:  Patient ID: Dana Dyer, female    DOB: Feb 27, 1951,  MRN: [id number]  SARAHELIZABETH CONWAY presents to clinic today for with chief concern of painful neuropathy symptoms.   She has 16 year history of diabetes. Relates some numbness to both feet, but more tingling symptoms at night. Does not relate any burning.   Her blood sugar was 206 mg/dl this morning. Last A1c was 6.7%.  She has had physical therapy in the past and found it to be helpful and is requesting another course.  She takes a Cymbalta 20 mg capsule at night and has also found this to be helpful. This was prescribed for a shoulder problem.  She has also tried gabapentin and this was discontinued due to lower extremity swelling.  Review of Systems: Negative except as noted in the HPI. Past Medical History:  Diagnosis Date  . ALLERGIC RHINITIS 10/09/2007  . Allergy   . Arthritis    hips, spine  . DIABETES MELLITUS, UNCONTROLLED 03/16/2009  . FATIGUE 10/21/2008  . FIBROMYALGIA 10/09/2007  . GERD (gastroesophageal reflux disease)    diet  controlled, no meds  . H/O vaginal hysterectomy   . HYPERLIPIDEMIA 10/09/2007   diet controlled, no meds  . HYPERTENSION 10/09/2007  . Morton's neuroma   . OBSTRUCTIVE SLEEP APNEA 10/09/2007  . Plantar fasciitis    History - right  . Sleep apnea    Uses CPAP  . TRANSAMINASES, SERUM, ELEVATED 07/08/2009   Past Surgical History:  Procedure Laterality Date  . ABDOMINAL HYSTERECTOMY  1979   prolapsed uterus  . BASAL CELL CARCINOMA EXCISION    . BREAST BIOPSY     left 2012 stereo  . BREAST REDUCTION SURGERY Bilateral 1992  . COLONOSCOPY  2006   [facility]  . mamoplasty     reduction  . REDUCTION MAMMAPLASTY Bilateral   . TONSILLECTOMY  1959  . TUBAL LIGATION  1976    Current Outpatient Medications:  .  amLODipine (NORVASC) 5 MG tablet, TAKE 1 TABLET BY MOUTH EVERY DAY, Disp: 90 tablet, Rfl: 0 .  betamethasone dipropionate 0.05 % cream, , Disp: , Rfl:  .  DULoxetine  (CYMBALTA) 20 MG capsule, Take 1 capsule (20 mg total) by mouth daily., Disp: 30 capsule, Rfl: 0 .  ferrous sulfate 325 (65 FE) MG tablet, Take 325 mg by mouth daily with breakfast., Disp: , Rfl:  .  hydrochlorothiazide (HYDRODIURIL) 25 MG tablet, TAKE 1 TABLET BY MOUTH EVERY DAY, Disp: 90 tablet, Rfl: 1 .  insulin glargine (LANTUS SOLOSTAR) 100 UNIT/ML Solostar Pen, Inject 30 Units into the skin daily., Disp: 45 mL, Rfl: 3 .  insulin lispro (HUMALOG KWIKPEN) 100 UNIT/ML KwikPen, Inject 0.1 mLs (10 Units total) into the skin daily with breakfast AND 0.15 mLs (15 Units total) daily with lunch AND 0.15 mLs (15 Units total) daily with supper., Disp: 45 mL, Rfl: 3 .  Insulin Pen Needle 32G X 4 MM MISC, 1 Device by Does not apply route as directed., Disp: 400 each, Rfl: 3 .  Lancets (ONETOUCH DELICA PLUS [id number]) MISC, USE TO CHECK BLOOD GLUCOSE 3 TIMES A DAY, Disp: 300 each, Rfl: 1 .  losartan (COZAAR) 100 MG tablet, TAKE 1 TABLET BY MOUTH EVERY DAY, Disp: 90 tablet, Rfl: 1 .  metFORMIN (GLUCOPHAGE) 1000 MG tablet, TAKE 1 TABLET BY MOUTH TWICE A DAY, Disp: 180 tablet, Rfl: 3 .  MISC NATURAL PRODUCTS PO, Take by mouth., Disp: , Rfl:  .  moxifloxacin (VIGAMOX) 0.5 % ophthalmic solution,  PLACE 1 DROP IN THE RIGHT EYE 3 TIMES DAILY, Disp: , Rfl:  .  nadolol (CORGARD) 40 MG tablet, Take 1 tablet (40 mg total) by mouth daily., Disp: 30 tablet, Rfl: 11 .  neomycin-polymyxin b-dexamethasone (MAXITROL) 3.5-10000-0.1 OINT, APPLY TO LOWER RIGHT EYELID MARGIN TWICE DAILY FOR 2 WEEKS, Disp: , Rfl:  .  NON FORMULARY, [employer]  Anti-fungal-#1, Disp: , Rfl:  .  ONETOUCH VERIO test strip, TEST 3 TIMES DAILY DX E11.9, Disp: 300 each, Rfl: 1 .  rosuvastatin (CRESTOR) 10 MG tablet, TAKE 1 TABLET BY MOUTH EVERY DAY, Disp: 90 tablet, Rfl: 2  Current Facility-Administered Medications:  .  TDaP (BOOSTRIX) injection 0.5 mL, 0.5 mL, Intramuscular, Once, Burchette, Alinda Sierras, MD Allergies  Allergen Reactions  .  Amoxicillin-Pot Clavulanate     Severe diarrhea  . Codeine Sulfate     REACTION: "hyper"  . Erythromycin Base   . Invokana [Canagliflozin] Other (See Comments)    Recurrent yeast infections  . Irbesartan-Hydrochlorothiazide   . Latex   . Victoza [Liraglutide] Nausea Only  . Ozempic (0.25 Or 0.5 Mg-Dose) [Semaglutide(0.25 Or 0.92m-Dos)] Nausea Only   Social History   Occupational History  . Not on file  Tobacco Use  . Smoking status: Never Smoker  . Smokeless tobacco: Never Used  Vaping Use  . Vaping Use: Never used  Substance and Sexual Activity  . Alcohol use: No    Alcohol/week: 0.0 standard drinks  . Drug use: No  . Sexual activity: Yes    Birth control/protection: Other-see comments, Post-menopausal    Comment: Hysterectomy    Objective:   Constitutional JKATJA BLUEis a pleasant 69y.o. Caucasian female, WD, WN in NAD. AAO x 3.   Vascular Capillary refill time to digits immediate b/l. Palpable pedal pulses b/l LE. Pedal hair present. Lower extremity skin temperature gradient within normal limits.  No cyanosis or clubbing noted.  Neurologic Normal speech. Oriented to person, place, and time. Epicritic sensation to light touch grossly present bilaterally. Protective sensation intact 5/5 intact bilaterally with 10g monofilament b/l. Vibratory sensation intact b/l.  Dermatologic Pedal skin with normal turgor, texture and tone bilaterally. No open wounds bilaterally. No interdigital macerations bilaterally. Incurvated nailplate medial border(s) L hallux.  Nail border hypertrophy absent. There is tenderness to palpation. Sign(s) of infection: no clinical signs of infection noted on examination today.. Hyperkeratotic lesion(s) L hallux, R hallux, submet Dyer 1 left foot and submet Dyer 1 right foot.  No erythema, no edema, no drainage, no flocculence.  Orthopedic: Normal muscle strength 5/5 to all lower extremity muscle groups bilaterally. No pain crepitus or joint limitation  noted with ROM b/l. Hallux valgus with bunion deformity noted b/l lower extremities. Patient ambulates independent of any assistive aids.   Radiographs: None Assessment:   1. Pain due to onychomycosis of toenails of both feet   2. Pain in left toe(s)   3. Diabetic peripheral neuropathy associated with type 2 diabetes mellitus ([place]   4. Neuropathic pain   5. Ingrown toenail without infection    Plan:  Patient was evaluated and treated and all questions answered. -Nails palliatively debridement as below -Educated on self-care  Procedure: Nail Debridement Rationale: Pain Type of Debridement: manual, sharp debridement. Instrumentation: Nail nipper, currette Number of Nails: 1 -Examined patient. -Continue diabetic foot care principles. Discussed control of blood sugar in relation to development of neuropathy. She related understanding on today's visit. -Offending nail border debrided and curretaged left hallux. Border cleansed with alcohol and  triple antibiotic applied. Ms. Berhow was instructed to apply antibiotic ointment to digit once daily for one week. Call if she has any problems. -Patient to report any pedal injuries to medical professional immediately. -Discussed neuropathic pain. Patient referred to [facility] for evaluation and treatment regarding painful neuropathy symptoms. Also recommend [facility] therapy. -Patient to continue soft, supportive shoe gear daily. -Patient/POA to call should there be question/concern in the interim.  Return in about 10 weeks (around 10/06/2020) for neuropathy pain after physical therapy with [facility].  Marzetta Board, DPM

## 2020-07-30 ENCOUNTER — Telehealth: Payer: Self-pay | Admitting: Family Medicine

## 2020-07-30 NOTE — Telephone Encounter (Signed)
Pt states the Cymbalta is really helping, feeling that her energy is more "pure". She would like a 90 day supply called into [facility] on [address].

## 2020-07-31 ENCOUNTER — Other Ambulatory Visit: Payer: Self-pay

## 2020-07-31 MED ORDER — DULOXETINE HCL 20 MG PO CPEP: 20.0000 mg | ORAL_CAPSULE | Freq: Every day | ORAL | 0 refills | Status: DC

## 2020-07-31 NOTE — Telephone Encounter (Signed)
Rx refilled. Patient notified via [place].

## 2020-08-03 DIAGNOSIS — R262 Difficulty in walking, not elsewhere classified: Secondary | ICD-10-CM | POA: Diagnosis not present

## 2020-08-03 DIAGNOSIS — R531 Weakness: Secondary | ICD-10-CM | POA: Diagnosis not present

## 2020-08-03 DIAGNOSIS — M545 Low back pain: Secondary | ICD-10-CM | POA: Diagnosis not present

## 2020-08-03 DIAGNOSIS — G9009 Other idiopathic peripheral autonomic neuropathy: Secondary | ICD-10-CM | POA: Diagnosis not present

## 2020-08-05 DIAGNOSIS — G9009 Other idiopathic peripheral autonomic neuropathy: Secondary | ICD-10-CM | POA: Diagnosis not present

## 2020-08-05 DIAGNOSIS — M545 Low back pain: Secondary | ICD-10-CM | POA: Diagnosis not present

## 2020-08-05 DIAGNOSIS — R262 Difficulty in walking, not elsewhere classified: Secondary | ICD-10-CM | POA: Diagnosis not present

## 2020-08-05 DIAGNOSIS — R531 Weakness: Secondary | ICD-10-CM | POA: Diagnosis not present

## 2020-08-06 ENCOUNTER — Ambulatory Visit: Payer: Medicare PPO | Admitting: Internal Medicine

## 2020-08-07 DIAGNOSIS — R531 Weakness: Secondary | ICD-10-CM | POA: Diagnosis not present

## 2020-08-07 DIAGNOSIS — R262 Difficulty in walking, not elsewhere classified: Secondary | ICD-10-CM | POA: Diagnosis not present

## 2020-08-07 DIAGNOSIS — G9009 Other idiopathic peripheral autonomic neuropathy: Secondary | ICD-10-CM | POA: Diagnosis not present

## 2020-08-07 DIAGNOSIS — M545 Low back pain: Secondary | ICD-10-CM | POA: Diagnosis not present

## 2020-08-12 DIAGNOSIS — M545 Low back pain: Secondary | ICD-10-CM | POA: Diagnosis not present

## 2020-08-12 DIAGNOSIS — R531 Weakness: Secondary | ICD-10-CM | POA: Diagnosis not present

## 2020-08-12 DIAGNOSIS — R262 Difficulty in walking, not elsewhere classified: Secondary | ICD-10-CM | POA: Diagnosis not present

## 2020-08-12 DIAGNOSIS — G9009 Other idiopathic peripheral autonomic neuropathy: Secondary | ICD-10-CM | POA: Diagnosis not present

## 2020-08-14 DIAGNOSIS — R262 Difficulty in walking, not elsewhere classified: Secondary | ICD-10-CM | POA: Diagnosis not present

## 2020-08-14 DIAGNOSIS — R531 Weakness: Secondary | ICD-10-CM | POA: Diagnosis not present

## 2020-08-14 DIAGNOSIS — G9009 Other idiopathic peripheral autonomic neuropathy: Secondary | ICD-10-CM | POA: Diagnosis not present

## 2020-08-14 DIAGNOSIS — M545 Low back pain: Secondary | ICD-10-CM | POA: Diagnosis not present

## 2020-08-17 DIAGNOSIS — R531 Weakness: Secondary | ICD-10-CM | POA: Diagnosis not present

## 2020-08-17 DIAGNOSIS — M545 Low back pain: Secondary | ICD-10-CM | POA: Diagnosis not present

## 2020-08-17 DIAGNOSIS — R262 Difficulty in walking, not elsewhere classified: Secondary | ICD-10-CM | POA: Diagnosis not present

## 2020-08-17 DIAGNOSIS — G9009 Other idiopathic peripheral autonomic neuropathy: Secondary | ICD-10-CM | POA: Diagnosis not present

## 2020-08-19 DIAGNOSIS — G9009 Other idiopathic peripheral autonomic neuropathy: Secondary | ICD-10-CM | POA: Diagnosis not present

## 2020-08-19 DIAGNOSIS — R262 Difficulty in walking, not elsewhere classified: Secondary | ICD-10-CM | POA: Diagnosis not present

## 2020-08-19 DIAGNOSIS — R531 Weakness: Secondary | ICD-10-CM | POA: Diagnosis not present

## 2020-08-19 DIAGNOSIS — M545 Low back pain: Secondary | ICD-10-CM | POA: Diagnosis not present

## 2020-08-21 DIAGNOSIS — G9009 Other idiopathic peripheral autonomic neuropathy: Secondary | ICD-10-CM | POA: Diagnosis not present

## 2020-08-21 DIAGNOSIS — M545 Low back pain: Secondary | ICD-10-CM | POA: Diagnosis not present

## 2020-08-21 DIAGNOSIS — R531 Weakness: Secondary | ICD-10-CM | POA: Diagnosis not present

## 2020-08-21 DIAGNOSIS — R262 Difficulty in walking, not elsewhere classified: Secondary | ICD-10-CM | POA: Diagnosis not present

## 2020-08-31 ENCOUNTER — Other Ambulatory Visit (INDEPENDENT_AMBULATORY_CARE_PROVIDER_SITE_OTHER): Payer: Medicare PPO

## 2020-08-31 ENCOUNTER — Other Ambulatory Visit: Payer: Self-pay

## 2020-08-31 ENCOUNTER — Other Ambulatory Visit: Payer: Self-pay | Admitting: Family Medicine

## 2020-08-31 ENCOUNTER — Ambulatory Visit (HOSPITAL_COMMUNITY)
Admission: RE | Admit: 2020-08-31 | Discharge: 2020-08-31 | Disposition: A | Discharge status: Home / Self Care | Payer: Medicare PPO | Source: Ambulatory Visit | Attending: Gastroenterology | Admitting: Gastroenterology

## 2020-08-31 DIAGNOSIS — M545 Low back pain: Secondary | ICD-10-CM | POA: Diagnosis not present

## 2020-08-31 DIAGNOSIS — K7460 Unspecified cirrhosis of liver: Secondary | ICD-10-CM | POA: Insufficient documentation

## 2020-08-31 DIAGNOSIS — R262 Difficulty in walking, not elsewhere classified: Secondary | ICD-10-CM | POA: Diagnosis not present

## 2020-08-31 DIAGNOSIS — R531 Weakness: Secondary | ICD-10-CM | POA: Diagnosis not present

## 2020-08-31 DIAGNOSIS — R161 Splenomegaly, not elsewhere classified: Secondary | ICD-10-CM | POA: Diagnosis not present

## 2020-08-31 DIAGNOSIS — G9009 Other idiopathic peripheral autonomic neuropathy: Secondary | ICD-10-CM | POA: Diagnosis not present

## 2020-08-31 LAB — CBC WITH DIFFERENTIAL/PLATELET
Basophils Absolute: 0 10*3/uL (ref 0.0–0.1)
Basophils Relative: 0.6 % (ref 0.0–3.0)
Eosinophils Absolute: 0.2 10*3/uL (ref 0.0–0.7)
Eosinophils Relative: 6.1 % — ABNORMAL HIGH (ref 0.0–5.0)
HCT: 26.9 % — ABNORMAL LOW (ref 36.0–46.0)
Hemoglobin: 8.5 g/dL — ABNORMAL LOW (ref 12.0–15.0)
Lymphocytes Relative: 27.7 % (ref 12.0–46.0)
Lymphs Abs: 1 10*3/uL (ref 0.7–4.0)
MCHC: 31.5 g/dL (ref 30.0–36.0)
MCV: 68.3 fl — ABNORMAL LOW (ref 78.0–100.0)
Monocytes Absolute: 0.5 10*3/uL (ref 0.1–1.0)
Monocytes Relative: 15.1 % — ABNORMAL HIGH (ref 3.0–12.0)
Neutro Abs: 1.8 10*3/uL (ref 1.4–7.7)
Neutrophils Relative %: 50.5 % (ref 43.0–77.0)
Platelets: 96 10*3/uL — ABNORMAL LOW (ref 150.0–400.0)
RBC: 3.95 Mil/uL (ref 3.87–5.11)
RDW: 17.9 % — ABNORMAL HIGH (ref 11.5–15.5)
WBC: 3.5 10*3/uL — ABNORMAL LOW (ref 4.0–10.5)

## 2020-08-31 LAB — COMPREHENSIVE METABOLIC PANEL
ALT: 33 U/L (ref 0–35)
AST: 36 U/L (ref 0–37)
Albumin: 3.6 g/dL (ref 3.5–5.2)
Alkaline Phosphatase: 81 U/L (ref 39–117)
BUN: 11 mg/dL (ref 6–23)
CO2: 33 mEq/L — ABNORMAL HIGH (ref 19–32)
Calcium: 9.4 mg/dL (ref 8.4–10.5)
Chloride: 97 mEq/L (ref 96–112)
Creatinine, Ser: 0.58 mg/dL (ref 0.40–1.20)
GFR: 102.47 mL/min (ref >60.00)
Glucose, Bld: 186 mg/dL — ABNORMAL HIGH (ref 70–99)
Potassium: 3.7 mEq/L (ref 3.5–5.1)
Sodium: 139 mEq/L (ref 135–145)
Total Bilirubin: 1.2 mg/dL (ref 0.2–1.2)
Total Protein: 7.1 g/dL (ref 6.0–8.3)

## 2020-08-31 LAB — PROTIME-INR
INR: 1.2 ratio — ABNORMAL HIGH (ref 0.8–1.0)
Prothrombin Time: 13.1 s (ref 9.6–13.1)

## 2020-09-01 ENCOUNTER — Telehealth: Payer: Self-pay | Admitting: Gastroenterology

## 2020-09-01 ENCOUNTER — Ambulatory Visit (INDEPENDENT_AMBULATORY_CARE_PROVIDER_SITE_OTHER): Payer: Medicare PPO

## 2020-09-01 ENCOUNTER — Telehealth: Payer: Self-pay | Admitting: Family Medicine

## 2020-09-01 ENCOUNTER — Ambulatory Visit: Payer: Medicare PPO | Admitting: Podiatry

## 2020-09-01 DIAGNOSIS — Z0000 Encounter for general adult medical examination without abnormal findings: Secondary | ICD-10-CM | POA: Diagnosis not present

## 2020-09-01 LAB — AFP TUMOR MARKER: AFP-Tumor Marker: 3.7 ng/mL

## 2020-09-01 NOTE — Patient Instructions (Signed)
Dana Dyer , Thank you for taking time to come for your Medicare Wellness Visit. I appreciate your ongoing commitment to your health goals. Please review the following plan we discussed and let me know if I can assist you in the future.   Screening recommendations/referrals: Colonoscopy: Up to date, next due 12/26/2021 Mammogram: Up to date, next due 07/14/2021 Bone Density: No longer required  Recommended yearly ophthalmology/optometry visit for glaucoma screening and checkup Recommended yearly dental visit for hygiene and checkup  Vaccinations: Influenza vaccine: Currently due, you may receive at your local pharmacy or schedule and appointment with our office  Pneumococcal vaccine: Completed series Tdap vaccine: Up to date, next due 12/14/2021 Shingles vaccine: Currently due, for shingrix, you may receive at your pharmacy     Advanced directives: please bring copies of your advanced directives into the office so that we may scan them into your chart.  Conditions/risks identified: None   Next appointment: None    Preventive Care 65 Years and Older, Female Preventive care refers to lifestyle choices and visits with your health care provider that can promote health and wellness. What does preventive care include?  A yearly physical exam. This is also called an annual well check.  Dental exams once or twice a year.  Routine eye exams. Ask your health care provider how often you should have your eyes checked.  Personal lifestyle choices, including:  Daily care of your teeth and gums.  Regular physical activity.  Eating a healthy diet.  Avoiding tobacco and drug use.  Limiting alcohol use.  Practicing safe sex.  Taking low-dose aspirin every day.  Taking vitamin and mineral supplements as recommended by your health care provider. What happens during an annual well check? The services and screenings done by your health care provider during your annual well check will  depend on your age, overall health, lifestyle risk factors, and family history of disease. Counseling  Your health care provider may ask you questions about your:  Alcohol use.  Tobacco use.  Drug use.  Emotional well-being.  Home and relationship well-being.  Sexual activity.  Eating habits.  History of falls.  Memory and ability to understand (cognition).  Work and work Statistician.  Reproductive health. Screening  You may have the following tests or measurements:  Height, weight, and BMI.  Blood pressure.  Lipid and cholesterol levels. These may be checked every 5 years, or more frequently if you are over 72 years old.  Skin check.  Lung cancer screening. You may have this screening every year starting at age 26 if you have a 30-pack-year history of smoking and currently smoke or have quit within the past 15 years.  Fecal occult blood test (FOBT) of the stool. You may have this test every year starting at age 43.  Flexible sigmoidoscopy or colonoscopy. You may have a sigmoidoscopy every 5 years or a colonoscopy every 10 years starting at age 38.  Hepatitis C blood test.  Hepatitis B blood test.  Sexually transmitted disease (STD) testing.  Diabetes screening. This is done by checking your blood sugar (glucose) after you have not eaten for a while (fasting). You may have this done every 1-3 years.  Bone density scan. This is done to screen for osteoporosis. You may have this done starting at age 64.  Mammogram. This may be done every 1-2 years. Talk to your health care provider about how often you should have regular mammograms. Talk with your health care provider about your test results,  treatment options, and if necessary, the need for more tests. Vaccines  Your health care provider may recommend certain vaccines, such as:  Influenza vaccine. This is recommended every year.  Tetanus, diphtheria, and acellular pertussis (Tdap, Td) vaccine. You may need a  Td booster every 10 years.  Zoster vaccine. You may need this after age 52.  Pneumococcal 13-valent conjugate (PCV13) vaccine. One dose is recommended after age 68.  Pneumococcal polysaccharide (PPSV23) vaccine. One dose is recommended after age 14. Talk to your health care provider about which screenings and vaccines you need and how often you need them. This information is not intended to replace advice given to you by your health care provider. Make sure you discuss any questions you have with your health care provider. Document Released: 12/18/2015 Document Revised: 08/10/2016 Document Reviewed: 09/22/2015 Elsevier Interactive Patient Education  2017 [employer] Prevention in the Home Falls can cause injuries. They can happen to people of all ages. There are many things you can do to make your home safe and to help prevent falls. What can I do on the outside of my home?  Regularly fix the edges of walkways and driveways and fix any cracks.  Remove anything that might make you trip as you walk through a door, such as a raised step or threshold.  Trim any bushes or trees on the path to your home.  Use bright outdoor lighting.  Clear any walking paths of anything that might make someone trip, such as rocks or tools.  Regularly check to see if handrails are loose or broken. Make sure that both sides of any steps have handrails.  Any raised decks and porches should have guardrails on the edges.  Have any leaves, snow, or ice cleared regularly.  Use sand or salt on walking paths during winter.  Clean up any spills in your garage right away. This includes oil or grease spills. What can I do in the bathroom?  Use night lights.  Install grab bars by the toilet and in the tub and shower. Do not use towel bars as grab bars.  Use non-skid mats or decals in the tub or shower.  If you need to sit down in the shower, use a plastic, non-slip stool.  Keep the floor dry. Clean  up any water that spills on the floor as soon as it happens.  Remove soap buildup in the tub or shower regularly.  Attach bath mats securely with double-sided non-slip rug tape.  Do not have throw rugs and other things on the floor that can make you trip. What can I do in the bedroom?  Use night lights.  Make sure that you have a light by your bed that is easy to reach.  Do not use any sheets or blankets that are too big for your bed. They should not hang down onto the floor.  Have a firm chair that has side arms. You can use this for support while you get dressed.  Do not have throw rugs and other things on the floor that can make you trip. What can I do in the kitchen?  Clean up any spills right away.  Avoid walking on wet floors.  Keep items that you use a lot in easy-to-reach places.  If you need to reach something above you, use a strong step stool that has a grab bar.  Keep electrical cords out of the way.  Do not use floor polish or wax that makes floors  slippery. If you must use wax, use non-skid floor wax.  Do not have throw rugs and other things on the floor that can make you trip. What can I do with my stairs?  Do not leave any items on the stairs.  Make sure that there are handrails on both sides of the stairs and use them. Fix handrails that are broken or loose. Make sure that handrails are as long as the stairways.  Check any carpeting to make sure that it is firmly attached to the stairs. Fix any carpet that is loose or worn.  Avoid having throw rugs at the top or bottom of the stairs. If you do have throw rugs, attach them to the floor with carpet tape.  Make sure that you have a light switch at the top of the stairs and the bottom of the stairs. If you do not have them, ask someone to add them for you. What else can I do to help prevent falls?  Wear shoes that:  Do not have high heels.  Have rubber bottoms.  Are comfortable and fit you well.  Are  closed at the toe. Do not wear sandals.  If you use a stepladder:  Make sure that it is fully opened. Do not climb a closed stepladder.  Make sure that both sides of the stepladder are locked into place.  Ask someone to hold it for you, if possible.  Clearly mark and make sure that you can see:  Any grab bars or handrails.  First and last steps.  Where the edge of each step is.  Use tools that help you move around (mobility aids) if they are needed. These include:  Canes.  Walkers.  Scooters.  Crutches.  Turn on the lights when you go into a dark area. Replace any light bulbs as soon as they burn out.  Set up your furniture so you have a clear path. Avoid moving your furniture around.  If any of your floors are uneven, fix them.  If there are any pets around you, be aware of where they are.  Review your medicines with your doctor. Some medicines can make you feel dizzy. This can increase your chance of falling. Ask your doctor what other things that you can do to help prevent falls. This information is not intended to replace advice given to you by your health care provider. Make sure you discuss any questions you have with your health care provider. Document Released: 09/17/2009 Document Revised: 04/28/2016 Document Reviewed: 12/26/2014 Elsevier Interactive Patient Education  2017 [employer].

## 2020-09-01 NOTE — Progress Notes (Signed)
Subjective:   Dana Dyer is a 68 y.o. female who presents for Medicare Annual (Subsequent) preventive examination.  I connected with Dana Dyer  today by telephone and verified that I am speaking with the correct person using two identifiers. Location patient: home Location provider: work Persons participating in the virtual visit: patient, provider.   I discussed the limitations, risks, security and privacy concerns of performing an evaluation and management service by telephone and the availability of in person appointments. I also discussed with the patient that there may be a patient responsible charge related to this service. The patient expressed understanding and verbally consented to this telephonic visit.    Interactive audio and video telecommunications were attempted between this provider and patient, however failed, due to patient having technical difficulties OR patient did not have access to video capability.  We continued and completed visit with audio only.     Review of Systems    N/A Cardiac Risk Factors include: advanced age (>58mn, >>22women);diabetes mellitus;hypertension;dyslipidemia     Objective:    Today's Vitals   09/01/20 0948  PainSc: 6    There is no height or weight on file to calculate BMI.  Advanced Directives 09/01/2020 07/25/2018 05/11/2017 03/21/2017 09/21/2016 11/16/2015 11/02/2015  Does Patient Have a Medical Advance Directive? Yes No Yes Yes No;Yes Yes Yes  Type of AParamed[place]of [place] will - - - Living will;Healthcare Power of [place] will Living will;Healthcare Power of Attorney  Does patient want to make changes to medical advance directive? No - Patient declined - No - Patient declined - No - Patient declined - -  Copy of [place] Chart? No - copy requested - - - No - copy requested - -    Current Medications (verified) Outpatient Encounter  Medications as of 09/01/2020  Medication Sig  . amLODipine (NORVASC) 5 MG tablet TAKE 1 TABLET BY MOUTH EVERY DAY  . DULoxetine (CYMBALTA) 20 MG capsule Take 1 capsule (20 mg total) by mouth daily.  . hydrochlorothiazide (HYDRODIURIL) 25 MG tablet TAKE 1 TABLET BY MOUTH EVERY DAY  . insulin glargine (LANTUS SOLOSTAR) 100 UNIT/ML Solostar Pen Inject 30 Units into the skin daily.  . insulin lispro (HUMALOG KWIKPEN) 100 UNIT/ML KwikPen Inject 0.1 mLs (10 Units total) into the skin daily with breakfast AND 0.15 mLs (15 Units total) daily with lunch AND 0.15 mLs (15 Units total) daily with supper.  . Insulin Pen Needle 32G X 4 MM MISC 1 Device by Does not apply route as directed.  . Lancets (ONETOUCH DELICA PLUS [id number] MISC USE TO CHECK BLOOD GLUCOSE 3 TIMES A DAY  . losartan (COZAAR) 100 MG tablet TAKE 1 TABLET BY MOUTH EVERY DAY  . metFORMIN (GLUCOPHAGE) 1000 MG tablet TAKE 1 TABLET BY MOUTH TWICE A DAY  . nadolol (CORGARD) 40 MG tablet Take 1 tablet (40 mg total) by mouth daily.  .Glory RosebushVERIO test strip TEST 3 TIMES DAILY DX E11.9  . betamethasone dipropionate 0.05 % cream  (Patient not taking: Reported on 09/01/2020)  . ferrous sulfate 325 (65 FE) MG tablet Take 325 mg by mouth daily with breakfast. (Patient not taking: Reported on 09/01/2020)  . MISC NATURAL PRODUCTS PO Take by mouth. (Patient not taking: Reported on 09/01/2020)  . rosuvastatin (CRESTOR) 10 MG tablet TAKE 1 TABLET BY MOUTH EVERY DAY (Patient not taking: Reported on 09/01/2020)  . [DISCONTINUED] moxifloxacin (VIGAMOX) 0.5 % ophthalmic solution PLACE 1  DROP IN THE RIGHT EYE 3 TIMES DAILY  . [DISCONTINUED] neomycin-polymyxin b-dexamethasone (MAXITROL) 3.5-10000-0.1 OINT APPLY TO LOWER RIGHT EYELID MARGIN TWICE DAILY FOR 2 WEEKS  . [facility]  Anti-fungal-#1   Facility-Administered Encounter Medications as of 09/01/2020  Medication  . TDaP (BOOSTRIX) injection 0.5 mL    Allergies  (verified) Amoxicillin-pot clavulanate, Codeine sulfate, Erythromycin base, Invokana [canagliflozin], Irbesartan-hydrochlorothiazide, Latex, Victoza [liraglutide], and Ozempic (0.25 or 0.5 mg-dose) [semaglutide(0.25 or 0.67m-dos)]   History: Past Medical History:  Diagnosis Date  . ALLERGIC RHINITIS 10/09/2007  . Allergy   . Arthritis    hips, spine  . DIABETES MELLITUS, UNCONTROLLED 03/16/2009  . FATIGUE 10/21/2008  . FIBROMYALGIA 10/09/2007  . GERD (gastroesophageal reflux disease)    diet  controlled, no meds  . H/O vaginal hysterectomy   . HYPERLIPIDEMIA 10/09/2007   diet controlled, no meds  . HYPERTENSION 10/09/2007  . Morton's neuroma   . OBSTRUCTIVE SLEEP APNEA 10/09/2007  . Plantar fasciitis    History - right  . Sleep apnea    Uses CPAP  . TRANSAMINASES, SERUM, ELEVATED 07/08/2009   Past Surgical History:  Procedure Laterality Date  . ABDOMINAL HYSTERECTOMY  1979   prolapsed uterus  . BASAL CELL CARCINOMA EXCISION    . BREAST BIOPSY     left 2012 stereo  . BREAST REDUCTION SURGERY Bilateral 1992  . COLONOSCOPY  2006   [facility]  . mamoplasty     reduction  . REDUCTION MAMMAPLASTY Bilateral   . TONSILLECTOMY  1959  . TUBAL LIGATION  1976   Family History  Problem Relation Age of Onset  . Diabetes Mother   . Arthritis Mother   . Cancer Mother        breast  . Breast cancer Mother 740 . Alcohol abuse Father   . Arthritis Father   . Hypertension Maternal Grandfather   . Miscarriages / Stillbirths Paternal Grandfather   . Breast cancer Maternal Aunt        diagnosed in her 744's . Colon polyps Neg Hx   . Colon cancer Neg Hx   . Esophageal cancer Neg Hx   . Rectal cancer Neg Hx   . Stomach cancer Neg Hx    Social History   Socioeconomic History  . Marital status: Married    Spouse name: Not on file  . Number of children: Not on file  . Years of education: Not on file  . Highest education level: Not on file  Occupational History  . Not on file  Tobacco  Use  . Smoking status: Never Smoker  . Smokeless tobacco: Never Used  Vaping Use  . Vaping Use: Never used  Substance and Sexual Activity  . Alcohol use: No    Alcohol/week: 0.0 standard drinks  . Drug use: No  . Sexual activity: Yes    Birth control/protection: Other-see comments, Post-menopausal    Comment: Hysterectomy  Other Topics Concern  . Not on file  Social History Narrative  . Not on file   Social Determinants of Health   Financial Resource Strain: Low Risk   . Difficulty of Paying Living Expenses: Not hard at all  Food Insecurity: No Food Insecurity  . Worried About RCharity fundraiserin the Last Year: Never true  . Ran Out of Food in the Last Year: Never true  Transportation Needs: No Transportation Needs  . Lack of Transportation (Medical): No  . Lack of Transportation (Non-Medical): No  Physical Activity: Insufficiently  Active  . Days of Exercise per Week: 1 day  . Minutes of Exercise per Session: 30 min  Stress: No Stress Concern Present  . Feeling of Stress : Not at all  Social Connections: Socially Integrated  . Frequency of Communication with Friends and Family: Three times a week  . Frequency of Social Gatherings with Friends and Family: More than three times a week  . Attends Religious Services: More than 4 times per year  . Active Member of Clubs or Organizations: Yes  . Attends Archivist Meetings: More than 4 times per year  . Marital Status: Married    Tobacco Counseling Counseling given: Not Answered   Clinical Intake:  Pre-visit preparation completed: Yes  Pain : 0-10 Pain Score: 6  Pain Type: Chronic pain Pain Location: Back Pain Orientation: Lower, Left Pain Descriptors / Indicators: Aching Pain Onset: More than a month ago Pain Frequency: Intermittent Pain Relieving Factors: Advil  Pain Relieving Factors: Advil  Nutritional Risks: Nausea/ vomitting/ diarrhea (has IBS goes back and fourth with constipation and  diarrhea) Diabetes: Yes (Patient states checks glucose twice daily) CBG done?: No Did pt. bring in CBG monitor from home?: No  How often do you need to have someone help you when you read instructions, pamphlets, or other written materials from your doctor or pharmacy?: 1 - Never What is the last grade level you completed in school?: High School  Diabetic?Yes  Interpreter Needed?: No  Information entered by :: [place] of Daily Living In your present state of health, do you have any difficulty performing the following activities: 09/01/2020  Hearing? N  Vision? Y  Comment Has noticed a tiny differences with vision was told that she has cataracts  Difficulty concentrating or making decisions? N  Walking or climbing stairs? Y  Comment Has had issues since back problems started  Dressing or bathing? N  Doing errands, shopping? N  Preparing Food and eating ? N  Using the Toilet? N  In the past six months, have you accidently leaked urine? N  Do you have problems with loss of bowel control? N  Managing your Medications? N  Managing your Finances? N  Housekeeping or managing your Housekeeping? N  Some recent data might be hidden    Patient Care Team: Eulas Post, MD as PCP - General  Indicate any recent Medical Services you may have received from other than [facility] providers in the past year (date may be approximate).     Assessment:   This is a routine wellness examination for Helen.  Hearing/Vision screen  Hearing Screening   125Hz  250Hz  500Hz  1000Hz  2000Hz  3000Hz  4000Hz  6000Hz  8000Hz   Right ear:           Left ear:           Vision Screening Comments: Patient states gets eyes checked yearly   Dietary issues and exercise activities discussed: Current Exercise Habits: Home exercise routine, Type of exercise: walking, Time (Minutes): 30, Frequency (Times/Week): 1, Weekly Exercise (Minutes/Week): 30, Intensity: Mild, Exercise limited by: orthopedic  condition(s)  Goals    . Exercise 150 min/wk Moderate Activity    . patient     To be mentally happy and stay active in explore different choices     . Reduce calorie intake to 2000 calories per day     Check out  online nutrition programs as GumSearch.nl and http://vang.com/; fit67m;  Look for foods with "whole" wheat; bran; oatmeal etc Shot at  the farmer's markets in season for fresher choices  Watch for "hydrogenated" on the label of oils which are trans-fats.  Watch for "high fructose corn syrup" in snacks, yogurt or ketchup  Meats have less marbling; bright colored fruits and vegetables;  Canned; dump out liquid and wash vegetables. Be mindful of what we are eating  Portion control is essential to a health weight! Sit down; take a break and enjoy your meal; take smaller bites; put the fork down between bites;  It takes 20 minutes to get full; so check in with your fullness cues and stop eating when you start to fill full             Depression Screen PHQ 2/9 Scores 09/01/2020 07/05/2019 06/25/2018 07/31/2017 07/18/2017 05/22/2017 05/11/2017  PHQ - 2 Score 0 0 0 1 5 0 0  PHQ- 9 Score 0 4 - - 19 - -    Fall Risk Fall Risk  09/01/2020 07/05/2019 06/25/2018 05/22/2017 05/11/2017  Falls in the past year? 0 0 No No No  Number falls in past yr: 0 - - - -  Injury with Fall? 0 - - - -  Risk for fall due to : No Fall Risks - - - -  Follow up Falls evaluation completed;Falls prevention discussed - - - -    Any stairs in or around the home? Yes  If so, are there any without handrails? No  Home free of loose throw rugs in walkways, pet beds, electrical cords, etc? Yes  Adequate lighting in your home to reduce risk of falls? Yes   ASSISTIVE DEVICES UTILIZED TO PREVENT FALLS:  Life alert? No  Use of a cane, walker or w/c? No  Grab bars in the bathroom? No  Shower chair or bench in shower? Yes  Elevated toilet seat or a handicapped toilet? Yes     Cognitive Function: Cognitive  screening not indicated based on direct observation MMSE - Mini Mental State Exam 03/21/2017  Not completed: (No Data)        Immunizations Immunization History  Administered Date(s) Administered  . Fluad Quad(high Dose 65+) 08/27/2019  . Hep A / Hep B 12/10/2018, 12/10/2018, 12/18/2018, 12/18/2018, 12/31/2018, 12/31/2018, 01/07/2020  . Influenza Split 12/15/2011, 09/25/2012  . Influenza Whole 08/30/2010  . Influenza, High Dose Seasonal PF 09/01/2015, 09/19/2016, 07/31/2017, 09/27/2018  . Influenza,inj,Quad PF,6+ Mos 08/06/2013, 09/22/2014  . Moderna SARS-COVID-2 Vaccination 01/18/2020  . Pneumococcal Conjugate-13 12/08/2011, 09/01/2015  . Pneumococcal Polysaccharide-23 10/04/2016  . Td 12/05/1998  . Tdap 12/15/2011  . Zoster 01/24/2013    TDAP status: Up to date Flu Vaccine status: Up to date Pneumococcal vaccine status: Completed during today's visit. Covid-19 vaccine status: Completed vaccines  Qualifies for Shingles Vaccine? Yes   Zostavax completed No   Shingrix Completed?: No.    Education has been provided regarding the importance of this vaccine. Patient has been advised to call insurance company to determine out of pocket expense if they have not yet received this vaccine. Advised may also receive vaccine at local pharmacy or Health Dept. Verbalized acceptance and understanding.  Screening Tests Health Maintenance  Topic Date Due  . COVID-19 Vaccine (2 - Moderna 2-dose series) 02/15/2020  . FOOT EXAM  05/29/2020  . INFLUENZA VACCINE  07/05/2020  . OPHTHALMOLOGY EXAM  08/25/2020  . HEMOGLOBIN A1C  10/30/2020  . TETANUS/TDAP  12/14/2021  . COLONOSCOPY  12/26/2021  . MAMMOGRAM  07/14/2022  . DEXA SCAN  Completed  . PNA vac Low Risk Adult  Completed    Health Maintenance  Health Maintenance Due  Topic Date Due  . COVID-19 Vaccine (2 - Moderna 2-dose series) 02/15/2020  . FOOT EXAM  05/29/2020  . INFLUENZA VACCINE  07/05/2020  . OPHTHALMOLOGY EXAM  08/25/2020     Colorectal cancer screening: Completed 12/26/2018. Repeat every 3 years Mammogram status: Completed 07/14/2020. Repeat every year Bone Density status: Completed 03/01/2013. Results reflect: Bone density results: NORMAL. Repeat every 0 years.  Lung Cancer Screening: (Low Dose CT Chest recommended if Age 59-80 years, 30 pack-year currently smoking OR have quit w/in 15years.) does not qualify.   Lung Cancer Screening Referral: N/A  Additional Screening:  Hepatitis C Screening: does qualify;   Vision Screening: Recommended annual ophthalmology exams for early detection of glaucoma and other disorders of the eye. Is the patient up to date with their annual eye exam?  Yes  Who is the provider or what is the name of the office in which the patient attends annual eye exams? [facility]  If pt is not established with a provider, would they like to be referred to a provider to establish care? No .   Dental Screening: Recommended annual dental exams for proper oral hygiene  Community Resource Referral / Chronic Care Management: CRR required this visit?  No   CCM required this visit?  No      Plan:     I have personally reviewed and noted the following in the patient's chart:   . Medical and social history . Use of alcohol, tobacco or illicit drugs  . Current medications and supplements . Functional ability and status . Nutritional status . Physical activity . Advanced directives . List of other physicians . Hospitalizations, surgeries, and ER visits in previous 12 months . Vitals . Screenings to include cognitive, depression, and falls . Referrals and appointments  In addition, I have reviewed and discussed with patient certain preventive protocols, quality metrics, and best practice recommendations. A written personalized care plan for preventive services as well as general preventive health recommendations were provided to patient.     Ofilia Neas,  LPN   4/40/1027   Nurse Notes: None

## 2020-09-01 NOTE — Telephone Encounter (Signed)
Patient states she wants Dr Elease Hashimoto to know that she had a Nurse Advisor visit for an AWV.  No one told her it was a telephone call so she came into the office.

## 2020-09-01 NOTE — Progress Notes (Signed)
[place] Phone: [phone] Subjective:   Dana Dyer, am serving as a scribe for Dr. Hulan Saas. This visit occurred during the SARS-CoV-2 public health emergency.  Safety protocols were in place, including screening questions prior to the visit, additional usage of staff PPE, and extensive cleaning of exam room while observing appropriate contact time as indicated for disinfecting solutions.   I'm seeing this patient by the request  of:  Eulas Post, MD  CC: Low back pain follow-up  EVO:[id number]   07/10/2020 Patient does have degenerative disc disease of the lumbar spine.  Moderate in nature.  Patient though is not made any improvement and actually worsening for this chronic condition.  Patient will be sent to formal physical therapy that I think will be beneficial.  This will give her time to actually get better.  I do believe there is some underlying anxiety that is playing a role and encouraged her to discontinue the Lexapro and only be on the Cymbalta.  We will check with primary care provider but I think this will be beneficial.  She has had some allergies to medications and we will monitor.  Patient will follow up with me again in 6 weeks.  Total time with patient 33 minutes after discussing her other stressors  Update 09/02/2020 Dana Dyer is a 69 y.o. female coming in with complaint of DDD, lumbar spine. Patient states that flexion and rotation increase her pain. Pain is getting worse with muscle spasms occurring in thoracic spine. Discontinued gabapentin after leg swelling. Advil helps but patient has cirrhosis and is unable to take NSAIDs.  Patient states that the Cymbalta that she did not take changes helped her treatment medically and has helped her with some increasing in energy.  Patient states that it has not really helped with the back pain.  Patient states that it is continuing to be  unrelenting and giving her difficulty.     Past Medical History:  Diagnosis Date  . ALLERGIC RHINITIS 10/09/2007  . Allergy   . Arthritis    hips, spine  . DIABETES MELLITUS, UNCONTROLLED 03/16/2009  . FATIGUE 10/21/2008  . FIBROMYALGIA 10/09/2007  . GERD (gastroesophageal reflux disease)    diet  controlled, Dyer meds  . H/O vaginal hysterectomy   . HYPERLIPIDEMIA 10/09/2007   diet controlled, Dyer meds  . HYPERTENSION 10/09/2007  . Morton's neuroma   . OBSTRUCTIVE SLEEP APNEA 10/09/2007  . Plantar fasciitis    History - right  . Sleep apnea    Uses CPAP  . TRANSAMINASES, SERUM, ELEVATED 07/08/2009   Past Surgical History:  Procedure Laterality Date  . ABDOMINAL HYSTERECTOMY  1979   prolapsed uterus  . BASAL CELL CARCINOMA EXCISION    . BREAST BIOPSY     left 2012 stereo  . BREAST REDUCTION SURGERY Bilateral 1992  . COLONOSCOPY  2006   [facility]  . mamoplasty     reduction  . REDUCTION MAMMAPLASTY Bilateral   . TONSILLECTOMY  1959  . TUBAL LIGATION  1976   Social History   Socioeconomic History  . Marital status: Married    Spouse name: Not on file  . Number of children: Not on file  . Years of education: Not on file  . Highest education level: Not on file  Occupational History  . Not on file  Tobacco Use  . Smoking status: Never Smoker  . Smokeless tobacco: Never Used  Vaping Use  .  Vaping Use: Never used  Substance and Sexual Activity  . Alcohol use: Dyer    Alcohol/week: 0.0 standard drinks  . Drug use: Dyer  . Sexual activity: Yes    Birth control/protection: Other-see comments, Post-menopausal    Comment: Hysterectomy  Other Topics Concern  . Not on file  Social History Narrative  . Not on file   Social Determinants of Health   Financial Resource Strain: Low Risk   . Difficulty of Paying Living Expenses: Not hard at all  Food Insecurity: Dyer Food Insecurity  . Worried About Charity fundraiser in the Last Year: Never true  . Ran Out of Food in the Last  Year: Never true  Transportation Needs: Dyer Transportation Needs  . Lack of Transportation (Medical): Dyer  . Lack of Transportation (Non-Medical): Dyer  Physical Activity: Insufficiently Active  . Days of Exercise per Week: 1 day  . Minutes of Exercise per Session: 30 min  Stress: Dyer Stress Concern Present  . Feeling of Stress : Not at all  Social Connections: Socially Integrated  . Frequency of Communication with Friends and Family: Three times a week  . Frequency of Social Gatherings with Friends and Family: More than three times a week  . Attends Religious Services: More than 4 times per year  . Active Member of Clubs or Organizations: Yes  . Attends Archivist Meetings: More than 4 times per year  . Marital Status: Married   Allergies  Allergen Reactions  . Amoxicillin-Pot Clavulanate     Severe diarrhea  . Codeine Sulfate     REACTION: "hyper"  . Erythromycin Base   . Invokana [Canagliflozin] Other (See Comments)    Recurrent yeast infections  . Irbesartan-Hydrochlorothiazide   . Latex   . Victoza [Liraglutide] Nausea Only  . Ozempic (0.25 Or 0.5 Mg-Dose) [Semaglutide(0.25 Or 0.43m-Dos)] Nausea Only   Family History  Problem Relation Age of Onset  . Diabetes Mother   . Arthritis Mother   . Cancer Mother        breast  . Breast cancer Mother 769 . Alcohol abuse Father   . Arthritis Father   . Hypertension Maternal Grandfather   . Miscarriages / Stillbirths Paternal Grandfather   . Breast cancer Maternal Aunt        diagnosed in her 78's . Colon polyps Neg Hx   . Colon cancer Neg Hx   . Esophageal cancer Neg Hx   . Rectal cancer Neg Hx   . Stomach cancer Neg Hx     Current Outpatient Medications (Endocrine & Metabolic):  .  insulin glargine (LANTUS SOLOSTAR) 100 UNIT/ML Solostar Pen, Inject 30 Units into the skin daily. .  insulin lispro (HUMALOG KWIKPEN) 100 UNIT/ML KwikPen, Inject 0.1 mLs (10 Units total) into the skin daily with breakfast AND 0.15 mLs  (15 Units total) daily with lunch AND 0.15 mLs (15 Units total) daily with supper. .  metFORMIN (GLUCOPHAGE) 1000 MG tablet, TAKE 1 TABLET BY MOUTH TWICE A DAY   Current Outpatient Medications (Cardiovascular):  .  amLODipine (NORVASC) 5 MG tablet, TAKE 1 TABLET BY MOUTH EVERY DAY .  hydrochlorothiazide (HYDRODIURIL) 25 MG tablet, TAKE 1 TABLET BY MOUTH EVERY DAY .  losartan (COZAAR) 100 MG tablet, TAKE 1 TABLET BY MOUTH EVERY DAY .  nadolol (CORGARD) 40 MG tablet, Take 1 tablet (40 mg total) by mouth daily. .  rosuvastatin (CRESTOR) 10 MG tablet, TAKE 1 TABLET BY MOUTH EVERY DAY  Current Outpatient Medications (Hematological):  .  ferrous sulfate 325 (65 FE) MG tablet, Take 325 mg by mouth daily with breakfast.    Current Outpatient Medications (Other):  .  betamethasone dipropionate 0.05 % cream,  .  DULoxetine (CYMBALTA) 20 MG capsule, TAKE 1 CAPSULE BY MOUTH EVERY DAY .  Insulin Pen Needle 32G X 4 MM MISC, 1 Device by Does not apply route as directed. .  Lancets (ONETOUCH DELICA PLUS [id number]) MISC, USE TO CHECK BLOOD GLUCOSE 3 TIMES A DAY .  MISC NATURAL PRODUCTS PO, Take by mouth.  Glory Rosebush VERIO test strip, TEST 3 TIMES DAILY DX E11.9  Current Facility-Administered Medications (Other):  Marland Kitchen  TDaP (BOOSTRIX) injection 0.5 mL   Reviewed prior external information including notes and imaging from  primary care provider As well as notes that were available from care everywhere and other healthcare systems.  Past medical history, social, surgical and family history all reviewed in electronic medical record.  Dyer pertanent information unless stated regarding to the chief complaint.   Review of Systems:  Dyer headache, visual changes, nausea, vomiting, diarrhea, constipation, dizziness, abdominal pain, skin rash, fevers, chills, night sweats, weight loss, swollen lymph nodes, body aches, joint swelling, chest pain, shortness of breath, mood changes. POSITIVE muscle  aches  Objective  Blood pressure 124/66, pulse 85, height 5' 2"  (1.575 m), weight 182 lb (82.6 kg), SpO2 98 %.   General: Dyer apparent distress alert and oriented x3 mood and affect normal, dressed appropriately.  HEENT: Pupils equal, extraocular movements intact  Gait normal with good balance and coordination.  MSK: Arthritic changes of multiple joints Low back exam does have loss of lordosis, poor core strength.  Worsening pain with extension greater than 5 to 10 degrees.  Tightness with straight leg test but Dyer radicular symptoms.  Patient though has limited side bending laterally secondary to discomfort as well as some voluntary guarding.    Impression and Recommendations:     The above documentation has been reviewed and is accurate and complete Lyndal Pulley, DO       Note: This dictation was prepared with Dragon dictation along with smaller phrase technology. Any transcriptional errors that result from this process are unintentional.

## 2020-09-02 ENCOUNTER — Ambulatory Visit: Payer: Medicare PPO | Admitting: Family Medicine

## 2020-09-02 ENCOUNTER — Other Ambulatory Visit: Payer: Self-pay

## 2020-09-02 ENCOUNTER — Encounter: Payer: Self-pay | Admitting: Family Medicine

## 2020-09-02 VITALS — BP 124/66 | HR 85 | Ht 62.0 in | Wt 182.0 lb

## 2020-09-02 DIAGNOSIS — R262 Difficulty in walking, not elsewhere classified: Secondary | ICD-10-CM | POA: Diagnosis not present

## 2020-09-02 DIAGNOSIS — M5136 Other intervertebral disc degeneration, lumbar region: Secondary | ICD-10-CM

## 2020-09-02 DIAGNOSIS — M545 Low back pain, unspecified: Secondary | ICD-10-CM

## 2020-09-02 DIAGNOSIS — R531 Weakness: Secondary | ICD-10-CM | POA: Diagnosis not present

## 2020-09-02 DIAGNOSIS — G9009 Other idiopathic peripheral autonomic neuropathy: Secondary | ICD-10-CM | POA: Diagnosis not present

## 2020-09-02 NOTE — Telephone Encounter (Signed)
Dr Ardis Hughs is it safe for her to take 1/2 the recommended dose of advil for back pain?

## 2020-09-02 NOTE — Telephone Encounter (Signed)
Yes, that should be safe

## 2020-09-02 NOTE — Telephone Encounter (Signed)
The patient has been notified of this information and all questions answered.

## 2020-09-02 NOTE — Patient Instructions (Signed)
Glad Cymbalta is helping MRI lumbar spine [phone] Will write you in MyChart with next steps

## 2020-09-02 NOTE — Assessment & Plan Note (Signed)
Patient does have degenerative disc disease.  Has failed all conservative therapy including different medications.  The Cymbalta has helped her with some of the underlying anxiety but continues to have back pain that can stop her from activity.  Patient's x-rays did show some facet arthropathy and I do have some that are consistent with some mild spinal stenosis.  At this time I do feel advanced imaging with an MRI would be warranted.  Patient would be a candidate for an epidural.  Patient will increase activities where she can tolerate it we will discuss after imaging about follow-up.  Total time with patient with reviewing previous imaging and discectomy patient 31 minutes.

## 2020-09-03 DIAGNOSIS — H524 Presbyopia: Secondary | ICD-10-CM | POA: Diagnosis not present

## 2020-09-03 DIAGNOSIS — H2513 Age-related nuclear cataract, bilateral: Secondary | ICD-10-CM | POA: Diagnosis not present

## 2020-09-03 DIAGNOSIS — E119 Type 2 diabetes mellitus without complications: Secondary | ICD-10-CM | POA: Diagnosis not present

## 2020-09-03 LAB — HM DIABETES EYE EXAM

## 2020-09-09 ENCOUNTER — Ambulatory Visit
Admission: RE | Admit: 2020-09-09 | Discharge: 2020-09-09 | Disposition: A | Discharge status: Home / Self Care | Payer: Medicare PPO | Source: Ambulatory Visit | Attending: Family Medicine | Admitting: Family Medicine

## 2020-09-09 DIAGNOSIS — M5126 Other intervertebral disc displacement, lumbar region: Secondary | ICD-10-CM | POA: Diagnosis not present

## 2020-09-09 DIAGNOSIS — M545 Low back pain, unspecified: Secondary | ICD-10-CM

## 2020-09-10 DIAGNOSIS — L570 Actinic keratosis: Secondary | ICD-10-CM | POA: Diagnosis not present

## 2020-09-10 DIAGNOSIS — Z85828 Personal history of other malignant neoplasm of skin: Secondary | ICD-10-CM | POA: Diagnosis not present

## 2020-09-10 DIAGNOSIS — L309 Dermatitis, unspecified: Secondary | ICD-10-CM | POA: Diagnosis not present

## 2020-09-10 DIAGNOSIS — L820 Inflamed seborrheic keratosis: Secondary | ICD-10-CM | POA: Diagnosis not present

## 2020-09-14 ENCOUNTER — Other Ambulatory Visit: Payer: Self-pay

## 2020-09-14 ENCOUNTER — Telehealth: Payer: Self-pay | Admitting: Family Medicine

## 2020-09-14 DIAGNOSIS — M5136 Other intervertebral disc degeneration, lumbar region: Secondary | ICD-10-CM

## 2020-09-14 NOTE — Telephone Encounter (Signed)
The cyst looks quite benign to me. I would watch it  Will defer to patients great PCP if he feels otherwise.

## 2020-09-14 NOTE — Telephone Encounter (Signed)
Patient called responding to the MRI results in MyChart. She would like to proceed with the injection if this can be ordered for her.

## 2020-09-14 NOTE — Telephone Encounter (Signed)
Spoke with patient who will follow up with PCP.

## 2020-09-16 ENCOUNTER — Encounter: Payer: Self-pay | Admitting: Internal Medicine

## 2020-09-16 ENCOUNTER — Ambulatory Visit: Payer: Medicare PPO | Admitting: Internal Medicine

## 2020-09-16 ENCOUNTER — Other Ambulatory Visit: Payer: Self-pay

## 2020-09-16 VITALS — BP 136/84 | HR 80 | Ht 62.0 in | Wt 178.6 lb

## 2020-09-16 DIAGNOSIS — Z794 Long term (current) use of insulin: Secondary | ICD-10-CM | POA: Diagnosis not present

## 2020-09-16 DIAGNOSIS — E1142 Type 2 diabetes mellitus with diabetic polyneuropathy: Secondary | ICD-10-CM

## 2020-09-16 DIAGNOSIS — E1165 Type 2 diabetes mellitus with hyperglycemia: Secondary | ICD-10-CM | POA: Diagnosis not present

## 2020-09-16 DIAGNOSIS — E785 Hyperlipidemia, unspecified: Secondary | ICD-10-CM | POA: Diagnosis not present

## 2020-09-16 LAB — POCT GLYCOSYLATED HEMOGLOBIN (HGB A1C): Hemoglobin A1C: 8.4 % — AB (ref 4.0–5.6)

## 2020-09-16 MED ORDER — DEXCOM G6 SENSOR MISC: 1.0000 | 11 refills | Status: DC

## 2020-09-16 MED ORDER — DEXCOM G6 TRANSMITTER MISC: 1.0000 | 3 refills | Status: DC

## 2020-09-16 MED ORDER — DEXCOM G6 RECEIVER DEVI: 1.0000 | 0 refills | Status: DC

## 2020-09-16 NOTE — Progress Notes (Signed)
Name: Dana Dyer  Age/ Sex: 69 y.o., female   MRN/ DOB: [id number], October 05, 1951     PCP: Eulas Post, MD   Reason for Endocrinology Evaluation: Type 2 Diabetes Mellitus  Initial Endocrine Consultative Visit: 10/23/18    PATIENT IDENTIFIER: Dana Dyer is a 69 y.o. female with a past medical history of HTN, T2DM, Cirrhosis, Fibromyalgia and dyslipidemia . The patient has followed with Endocrinology clinic since 10/23/18 for consultative assistance with management of her diabetes.  DIABETIC HISTORY:  Ms. Puryear was diagnosed with T2DM in 2005. She used to be on SU but does not recall intolerance. She is intolerant to victoza and invokana. She was started on insulin therapy in 2009. Her hemoglobin A1c has ranged from 7.8 % in 2012, peaking at 10.9% in 2018.   Ozempic was stopped in 06/2019 due to persistent nausea.   Switched pravastatin to crestor 09/2019  SUBJECTIVE:   During the last visit (04/29/2020): A1c was 6.7 %. We continued metformin and adjusted MDI regimen   Today (09/16/2020): [name] is here for a 4 month follow up on diabetes management.  She checks her blood sugars 2 times daily. The patient has not had hypoglycemic episodes since the last clinic visit per se.  But she does endorse episodes of sweating in the middle of the night, she also endorses tight BG's in the morning in the 70s and 80s.   Has nausea , takes Famotidine follows with   ( Dr. Ardis Hughs)    Last eye exam summer 2021   [place]:  Lantus 30 units daily-  Taking 34 units  Humalog 10 units with Breakfast  Humalog 15 units with lunch and supper - 16-18 units  Metformin 1000 mg BID  Crestor 10 mg daily - 1-3 x a week - not taking      METER DOWNLOAD SUMMARY: 9/30-10/13/2021 Average Number Tests=  12 Overall Mean FS Glucose = 149 SD= 23   BG Ranges: Low = 102 High = 186   Hypoglycemic Events/30 Days: BG < 50 = 0 Episodes of symptomatic severe  hypoglycemia =0     HISTORY:  Past Medical History:  Past Medical History:  Diagnosis Date   ALLERGIC RHINITIS 10/09/2007   Allergy    Arthritis    hips, spine   DIABETES MELLITUS, UNCONTROLLED 03/16/2009   FATIGUE 10/21/2008   FIBROMYALGIA 10/09/2007   GERD (gastroesophageal reflux disease)    diet  controlled, no meds   H/O vaginal hysterectomy    HYPERLIPIDEMIA 10/09/2007   diet controlled, no meds   HYPERTENSION 10/09/2007   Morton's neuroma    OBSTRUCTIVE SLEEP APNEA 10/09/2007   Plantar fasciitis    History - right   Sleep apnea    Uses CPAP   TRANSAMINASES, SERUM, ELEVATED 07/08/2009   Past Surgical History:  Past Surgical History:  Procedure Laterality Date   ABDOMINAL HYSTERECTOMY  1979   prolapsed uterus   BASAL CELL CARCINOMA EXCISION     BREAST BIOPSY     left 2012 stereo   BREAST REDUCTION SURGERY Bilateral 1992   COLONOSCOPY  2006   [place]   mamoplasty     reduction   REDUCTION MAMMAPLASTY Bilateral    TONSILLECTOMY  1959   TUBAL LIGATION  1976    Social History:  reports that she has never smoked. She has never used smokeless tobacco. She reports that she does not drink alcohol and does not use drugs. Family History:  Family History  Problem Relation Age of Onset   Diabetes Mother    Arthritis Mother    Cancer Mother        breast   Breast cancer Mother 41   Alcohol abuse Father    Arthritis Father    Hypertension Maternal Grandfather    Miscarriages / Stillbirths Paternal Grandfather    Breast cancer Maternal Aunt        diagnosed in her 12's   Colon polyps Neg Hx    Colon cancer Neg Hx    Esophageal cancer Neg Hx    Rectal cancer Neg Hx    Stomach cancer Neg Hx      HOME MEDICATIONS: Allergies as of 09/16/2020      Reactions   Amoxicillin-pot Clavulanate    Severe diarrhea   Codeine Sulfate    REACTION: "hyper"   Erythromycin Base    Invokana [canagliflozin] Other (See Comments)   Recurrent  yeast infections   Irbesartan-hydrochlorothiazide    Latex    Victoza [liraglutide] Nausea Only   Ozempic (0.25 Or 0.5 Mg-dose) [semaglutide(0.25 Or 0.28m-dos)] Nausea Only      Medication List       Accurate as of September 16, 2020 12:23 PM. If you have any questions, ask your nurse or doctor.        amLODipine 5 MG tablet Commonly known as: NORVASC TAKE 1 TABLET BY MOUTH EVERY DAY   betamethasone dipropionate 0.05 % cream   Dexcom G6 Receiver Devi 1 Device by Does not apply route as directed. Started by: IDorita Sciara MD   Dexcom G6 Sensor Misc 1 Device by Does not apply route as directed. Started by: IDorita Sciara MD   Dexcom G6 Transmitter Misc 1 Device by Does not apply route as directed. Started by: IDorita Sciara MD   DULoxetine 20 MG capsule Commonly known as: CYMBALTA TAKE 1 CAPSULE BY MOUTH EVERY DAY   ferrous sulfate 325 (65 FE) MG tablet Take 325 mg by mouth daily with breakfast.   hydrochlorothiazide 25 MG tablet Commonly known as: HYDRODIURIL TAKE 1 TABLET BY MOUTH EVERY DAY   insulin lispro 100 UNIT/ML KwikPen Commonly known as: HumaLOG KwikPen Inject 0.1 mLs (10 Units total) into the skin daily with breakfast AND 0.15 mLs (15 Units total) daily with lunch AND 0.15 mLs (15 Units total) daily with supper.   Insulin Pen Needle 32G X 4 MM Misc 1 Device by Does not apply route as directed.   Lantus SoloStar 100 UNIT/ML Solostar Pen Generic drug: insulin glargine Inject 30 Units into the skin daily.   losartan 100 MG tablet Commonly known as: COZAAR TAKE 1 TABLET BY MOUTH EVERY DAY   metFORMIN 1000 MG tablet Commonly known as: GLUCOPHAGE TAKE 1 TABLET BY MOUTH TWICE A DAY   MISC NATURAL PRODUCTS PO Take by mouth.   nadolol 40 MG tablet Commonly known as: CORGARD Take 1 tablet (40 mg total) by mouth daily.   OneTouch Delica Plus [id number] USE TO CHECK BLOOD GLUCOSE 3 TIMES A DAY   OneTouch Verio test  strip Generic drug: glucose blood TEST 3 TIMES DAILY DX E11.9   rosuvastatin 10 MG tablet Commonly known as: CRESTOR TAKE 1 TABLET BY MOUTH EVERY DAY        OBJECTIVE:   Vital Signs: BP 136/84 (BP Location: Left Arm, Patient Position: Sitting, Cuff Size: Normal)    Pulse 80    Ht 5' 2"  (1.575 m)    Wt 178 lb 9.6 oz (  81 kg)    SpO2 94%    BMI 32.67 kg/m   Wt Readings from Last 3 Encounters:  09/16/20 178 lb 9.6 oz (81 kg)  09/02/20 182 lb (82.6 kg)  07/10/20 181 lb (82.1 kg)     Exam: General: Pt appears well and is in NAD  Lungs: Clear with good BS bilat with no rales, rhonchi, or wheezes  Heart: RRR with normal S1 and S2 and no gallops; no murmurs; no rub  Abdomen: Normoactive bowel sounds, soft, nontender, without masses or organomegaly palpable  Extremities: 1+ pretibial edema.  Neuro: MS is good with appropriate affect, pt is alert and Ox3   DM Foot 04/29/2020 The skin of the feet is intact without sores or ulcerations. The pedal pulses are 2+ on right and 2+ on left. The sensation is decreased  to a screening 5.07, 10 gram monofilament on the left      DATA REVIEWED:  Lab Results  Component Value Date   HGBA1C 8.4 (A) 09/16/2020   HGBA1C 6.7 (A) 04/29/2020   HGBA1C 7.7 (A) 01/21/2020   Lab Results  Component Value Date   MICROALBUR <0.7 09/09/2019   LDLCALC 126 (H) 08/27/2019   CREATININE 0.58 08/31/2020   Results for HAYDN, CUSH (MRN [id number]) as of 09/09/2019 15:44  Ref. Range 09/09/2019 09:38  Creatinine,U Latest Units: mg/dL 59.6  Microalb, Ur Latest Ref Range: 0.0 - 1.9 mg/dL <0.7  MICROALB/CREAT RATIO Latest Ref Range: 0.0 - 30.0 mg/g 1.2    ASSESSMENT / PLAN / RECOMMENDATIONS:   1) Type 2 Diabetes Mellitus, Poorly controlled, With neuropathic complications - Most recent A1c of 8.4 %. Goal A1c < 7.0 %.    - Her A1c trending up  - She has been stressed out and having some dietary indiscretions, she is motivated to work on this - I am  going to prescribed a CGM as I think that will help her.  - She is intolerant to GLP-1 agonists and SGLT-2 inhibitors.  - No changes today due to lack of sufficient glucose data but I suspect is insulin to carb mismatch    MEDICATIONS: - Continue Lantus 34 units daily - Continue Humalog 10 units with breakfast, 16-18 units with Lunch and Supper  - Metformin 1000 mg BID    EDUCATION / INSTRUCTIONS:  BG monitoring instructions: Patient is instructed to check her blood sugars 4 times a day, before meals and bedtime.  Call [facility] if: BG persistently < 70   I reviewed the Rule of 15 for the treatment of hypoglycemia in detail with the patient. Literature supplied.   2) Dyslipidemia:  She has not been able to tolerate crestor due to myalgia, she has tried vitamin D but that worsened her constipation. She was advised to try taking crestor  1-3x a week, as she has not done that yet   Medication Crestor 10 mg daily      F/U in 3 months    Signed electronically by: Mack Guise, MD  [facility]  [facility] [address] [place], [place] [address] Phone: [phone] FAX: [phone]   CC: Eulas [address], [place] [address] Phone: [phone]  Fax: [phone]  Return to Endocrinology clinic as below: Future Appointments  Date Time Provider [place]  09/24/2020  8:00 AM [facility] DG C-ARM RM 3 [facility]  09/30/2020  2:10 PM Milus Banister, MD [facility]  10/07/2020  1:15 PM Marzetta Board, DPM [facility]

## 2020-09-16 NOTE — Patient Instructions (Addendum)
-   Continue  Lantus 34 units daily  - Continue Humalog 10 units with Breakfast and 16-18  units with Lunch and supper - Continue Metformin 1000 mg, 1 tablet twice daily      HOW TO TREAT LOW BLOOD SUGARS (Blood sugar LESS THAN 70 MG/DL)  Please follow the RULE OF 15 for the treatment of hypoglycemia treatment (when your (blood sugars are less than 70 mg/dL)    STEP 1: Take 15 grams of carbohydrates when your blood sugar is low, which includes:   3-4 GLUCOSE TABS  OR  3-4 OZ OF JUICE OR REGULAR SODA OR  ONE TUBE OF GLUCOSE GEL     STEP 2: RECHECK blood sugar in 15 MINUTES STEP 3: If your blood sugar is still low at the 15 minute recheck --> then, go back to STEP 1 and treat AGAIN with another 15 grams of carbohydrates.

## 2020-09-24 ENCOUNTER — Telehealth: Payer: Self-pay | Admitting: Internal Medicine

## 2020-09-24 ENCOUNTER — Other Ambulatory Visit: Payer: Self-pay

## 2020-09-24 ENCOUNTER — Ambulatory Visit
Admission: RE | Admit: 2020-09-24 | Discharge: 2020-09-24 | Disposition: A | Discharge status: Home / Self Care | Payer: Medicare PPO | Source: Ambulatory Visit | Attending: Family Medicine | Admitting: Family Medicine

## 2020-09-24 DIAGNOSIS — M4696 Unspecified inflammatory spondylopathy, lumbar region: Secondary | ICD-10-CM | POA: Diagnosis not present

## 2020-09-24 DIAGNOSIS — M5136 Other intervertebral disc degeneration, lumbar region: Secondary | ICD-10-CM

## 2020-09-24 MED ORDER — IOPAMIDOL (ISOVUE-M 200) INJECTION 41%
1.0000 mL | Freq: Once | INTRAMUSCULAR | Status: AC
  Administered 2020-09-24: 1 mL via EPIDURAL

## 2020-09-24 MED ORDER — METHYLPREDNISOLONE ACETATE 40 MG/ML INJ SUSP (RADIOLOG
120.0000 mg | Freq: Once | INTRAMUSCULAR | Status: AC
  Administered 2020-09-24: 120 mg via EPIDURAL

## 2020-09-24 NOTE — Telephone Encounter (Signed)
Pt called to report elevated blood sugar 363. Pt states had steroid injection this morning in L4, L5 spine & wants to know if that caused the spike and what should she do.

## 2020-09-24 NOTE — Discharge Instructions (Signed)
Joint Injection Discharge Instructions  1. After your joint injection, use ice to the affected area for the next 24 hours as a temporary increase in pain is not uncommon for a day or two after your procedure.  2. Resume all medications unless otherwise instructed.  3. Common side effects of steroids include facial flushing or redness, restlessness or inability to sleep and an increase in your blood sugar if you are a diabetic.    4. Follow up with the ordering physician for post care.  5. If you have any of the following please call [phone]:      Temperature greater than 101     Pain, redness or swelling at the injection site  Thank you for visiting our office today.

## 2020-09-25 ENCOUNTER — Ambulatory Visit: Payer: Medicare PPO | Admitting: Internal Medicine

## 2020-09-29 NOTE — Telephone Encounter (Signed)
Called pt stated since having the steroid shot pt sugar reading fluctuate ---after steroid shot 363, 390 yesterday 390 after meal, today 133 am before meals. Pt stated --talk to [facility] front desk but could not find Dr. Kelton Pillar assistant and very disappointed for not getting a call back. I apologized to pt multiple times and pt requested to make sure the Dr. Kelton Pillar get this message.

## 2020-09-30 ENCOUNTER — Ambulatory Visit: Payer: Medicare PPO | Admitting: Gastroenterology

## 2020-10-07 ENCOUNTER — Ambulatory Visit: Payer: Medicare PPO | Admitting: Podiatry

## 2020-10-23 ENCOUNTER — Encounter: Payer: Self-pay | Admitting: Gastroenterology

## 2020-10-23 ENCOUNTER — Ambulatory Visit: Payer: Medicare PPO | Admitting: Gastroenterology

## 2020-10-23 ENCOUNTER — Other Ambulatory Visit (INDEPENDENT_AMBULATORY_CARE_PROVIDER_SITE_OTHER): Payer: Medicare PPO

## 2020-10-23 VITALS — BP 110/60 | HR 80 | Ht 62.0 in | Wt 182.0 lb

## 2020-10-23 DIAGNOSIS — K7460 Unspecified cirrhosis of liver: Secondary | ICD-10-CM

## 2020-10-23 LAB — CBC
HCT: 32.4 % — ABNORMAL LOW (ref 36.0–46.0)
Hemoglobin: 10.2 g/dL — ABNORMAL LOW (ref 12.0–15.0)
MCHC: 31.5 g/dL (ref 30.0–36.0)
MCV: 69.1 fl — ABNORMAL LOW (ref 78.0–100.0)
Platelets: 130 10*3/uL — ABNORMAL LOW (ref 150.0–400.0)
RBC: 4.7 Mil/uL (ref 3.87–5.11)
RDW: 19.4 % — ABNORMAL HIGH (ref 11.5–15.5)
WBC: 5.4 10*3/uL (ref 4.0–10.5)

## 2020-10-23 LAB — HEMOGLOBIN A1C: Hgb A1c MFr Bld: 8.2 % — ABNORMAL HIGH (ref 4.6–6.5)

## 2020-10-23 MED ORDER — POLYSACCHARIDE IRON COMPLEX 150 MG PO CAPS: 150.0000 mg | ORAL_CAPSULE | Freq: Every day | ORAL | 11 refills | Status: DC

## 2020-10-23 MED ORDER — ONDANSETRON HCL 4 MG PO TABS: 4.0000 mg | ORAL_TABLET | Freq: Two times a day (BID) | ORAL | 3 refills | Status: DC | PRN

## 2020-10-23 NOTE — Patient Instructions (Addendum)
If you are age 71 or older, your body mass index should be between 23-30. Your Body mass index is 33.29 kg/m. If this is out of the aforementioned range listed, please consider follow up with your Primary Care Provider.  If you are age 64 or younger, your body mass index should be between 19-25. Your Body mass index is 33.29 kg/m. If this is out of the aformentioned range listed, please consider follow up with your Primary Care Provider.   Your provider has requested that you go to the basement level for lab work before leaving today. Press "B" on the elevator. The lab is located at the first door on the left as you exit the elevator.  Due to recent changes in healthcare laws, you may see the results of your imaging and laboratory studies on MyChart before your provider has had a chance to review them.  We understand that in some cases there may be results that are confusing or concerning to you. Not all laboratory results come back in the same time frame and the provider may be waiting for multiple results in order to interpret others.  Please give [place] 48 hours in order for your provider to thoroughly review all the results before contacting the office for clarification of your results.   We have sent the following medications to your pharmacy for you to pick up at your convenience:  START: Nu Iron 137m take one tablet once daily  START: zofran 421mtake one tablet twice daily as needed for nausea.  You will need a follow up appointment in 6 months (May 2022).  One week prior to that appointment you will need lab work and liver ultrasound.  We will contact you to get this scheduled as the time gets closer.  Thank you for entrusting me with your care and choosing [facility] JaArdis Hughs

## 2020-10-23 NOTE — Progress Notes (Signed)
Review of pertinent gastrointestinal problems: 1.History of adenomatous polyps in her colon.Colonoscopy 2006 Dr. Timmothy Euler found no polyps. Colonoscopy Dr. Ardis Hughs December 2016 found 7 subcentimeter polyps. These were all adenomatous polyps on pathology. Also documented left-sided diverticulosis. I recommend she have a repeat colonoscopy at 3-year interval.Colonoscopy January 2020 fourpolyps were removed from her colon,all were adenomatous on pathology. She was recommended to have repeat colonoscopy at 3-year interval 2. Cirrhosis, likelyfrom longstanding fatty liver disease, out-of-control diabetes.Diagnosed 2019. Lab workup 2019:Hep C Ab negative, Hep B Surface Ag negative,hepatitis B surface antibody negative, hep B Core Igm negative, Hep A IgM negative. Ferritin normal, HIV negative, ANA negative,antimitochondrial antibody negative,hepatitis a antibody total negative;slightly low platelets, transaminases slightly elevated. No edema.  [place] 07/2018: slightly nodular,suspicious for cirrhosis. [place] 12/2019 cirrhosis without mass lesions.  Ultrasound 08/2020 showed cirrhosis without focal mass lesions.  Gallbladder thick but without Murphy sign.  TUU8/2800 small distal esophagus varices, mild portal gastropathy, no gastric varices. She was started on nadolol(eventually at 87m daily)  AFP 08/2020 3.7  MELD 9 (08/2020 labs)  Hepatitis A, B vaccination series started 11/2018   HPI: This is a pleasant 69year old woman whom I last saw 6 or 8 months ago  Hemoglobin A1c last month was 8.4  She had routine labs drawn 2 months ago.  Meld score still at 9.  It was interesting however because her hemoglobin was quite a bit lower than it was usually.  Her hemoglobin is 8.5 and in January it was 11.3.  Her MCV was low.  She admitted to stopping her iron supplements.  And we discussed this with her and she restarted once daily over-the-counter iron supplement.   She is not very good about  taking the iron every day because it causes a lot of upset stomach, dyspepsia, some constipation.  She is also bothered by some diarrhea and some nausea that she does not think is necessarily related to the iron.  She has had no overt GI bleeding.  She overall feels generally pretty well.  Her hemoglobin A1c last month was 8.5.  It sounds like she ha has had a lot of health problems in her family brain cancers.  No problems with edema, no problems with encephalopathy  ROS: complete GI ROS as described in HPI, all other review negative.  Constitutional:  No unintentional weight loss   Past Medical History:  Diagnosis Date   ALLERGIC RHINITIS 10/09/2007   Allergy    Arthritis    hips, spine   DIABETES MELLITUS, UNCONTROLLED 03/16/2009   FATIGUE 10/21/2008   FIBROMYALGIA 10/09/2007   GERD (gastroesophageal reflux disease)    diet  controlled, no meds   H/O vaginal hysterectomy    HYPERLIPIDEMIA 10/09/2007   diet controlled, no meds   HYPERTENSION 10/09/2007   Morton's neuroma    OBSTRUCTIVE SLEEP APNEA 10/09/2007   Plantar fasciitis    History - right   Sleep apnea    Uses CPAP   TRANSAMINASES, SERUM, ELEVATED 07/08/2009    Past Surgical History:  Procedure Laterality Date   ABDOMINAL HYSTERECTOMY  1979   prolapsed uterus   BASAL CELL CARCINOMA EXCISION     BREAST BIOPSY     left 2012 stereo   BREAST REDUCTION SURGERY Bilateral 1992   [place]     reduction   REDUCTION MAMMAPLASTY Bilateral    TONSILLECTOMY  1959   TUBAL LIGATION  1976    Current Outpatient Medications  Medication Sig Dispense  Refill   amLODipine (NORVASC) 5 MG tablet TAKE 1 TABLET BY MOUTH EVERY DAY 90 tablet 0   betamethasone dipropionate 0.05 % cream      Continuous Blood Gluc Receiver (DEXCOM G6 RECEIVER) DEVI 1 Device by Does not apply route as directed. 1 each 0   Continuous Blood Gluc Sensor (DEXCOM G6 SENSOR) MISC 1 Device by Does not  apply route as directed. 3 each 11   Continuous Blood Gluc Transmit (DEXCOM G6 TRANSMITTER) MISC 1 Device by Does not apply route as directed. 1 each 3   DULoxetine (CYMBALTA) 20 MG capsule TAKE 1 CAPSULE BY MOUTH EVERY DAY 90 capsule 0   ferrous sulfate 325 (65 FE) MG tablet Take 325 mg by mouth daily with breakfast.      hydrochlorothiazide (HYDRODIURIL) 25 MG tablet TAKE 1 TABLET BY MOUTH EVERY DAY 90 tablet 1   insulin glargine (LANTUS SOLOSTAR) 100 UNIT/ML Solostar Pen Inject 30 Units into the skin daily. 45 mL 3   insulin lispro (HUMALOG KWIKPEN) 100 UNIT/ML KwikPen Inject 0.1 mLs (10 Units total) into the skin daily with breakfast AND 0.15 mLs (15 Units total) daily with lunch AND 0.15 mLs (15 Units total) daily with supper. 45 mL 3   Insulin Pen Needle 32G X 4 MM MISC 1 Device by Does not apply route as directed. 400 each 3   Lancets (ONETOUCH DELICA PLUS [id number]) MISC USE TO CHECK BLOOD GLUCOSE 3 TIMES A DAY 300 each 1   losartan (COZAAR) 100 MG tablet TAKE 1 TABLET BY MOUTH EVERY DAY 90 tablet 1   metFORMIN (GLUCOPHAGE) 1000 MG tablet TAKE 1 TABLET BY MOUTH TWICE A DAY 180 tablet 3   MISC NATURAL PRODUCTS PO Take by mouth.      nadolol (CORGARD) 40 MG tablet Take 1 tablet (40 mg total) by mouth daily. 30 tablet 11   ONETOUCH VERIO test strip TEST 3 TIMES DAILY DX E11.9 300 each 1   rosuvastatin (CRESTOR) 10 MG tablet TAKE 1 TABLET BY MOUTH EVERY DAY 90 tablet 2   Current Facility-Administered Medications  Medication Dose Route Frequency Provider Last Rate Last Admin   TDaP (BOOSTRIX) injection 0.5 mL  0.5 mL Intramuscular Once Eulas Post, MD        Allergies as of 10/23/2020 - Review Complete 10/23/2020  Allergen Reaction Noted   Amoxicillin-pot clavulanate  03/09/2012   Codeine sulfate  03/16/2009   Erythromycin base  04/07/2009   Invokana [canagliflozin] Other (See Comments) 05/18/2015   Irbesartan-hydrochlorothiazide  04/07/2009   Latex      Victoza [liraglutide] Nausea Only 03/10/2016   Ozempic (0.25 or 0.5 mg-dose) [semaglutide(0.25 or 0.95m-dos)] Nausea Only 09/09/2019    Family History  Problem Relation Age of Onset   Diabetes Mother    Arthritis Mother    Cancer Mother        breast   Breast cancer Mother 713  Alcohol abuse Father    Arthritis Father    Hypertension Maternal Grandfather    Miscarriages / Stillbirths Paternal Grandfather    Breast cancer Maternal Aunt        diagnosed in her 790's  Colon polyps Neg Hx    Colon cancer Neg Hx    Esophageal cancer Neg Hx    Rectal cancer Neg Hx    Stomach cancer Neg Hx     Social History   Socioeconomic History   Marital status: Married    Spouse name: Not on file   Number of  children: Not on file   Years of education: Not on file   Highest education level: Not on file  Occupational History   Not on file  Tobacco Use   Smoking status: Never Smoker   Smokeless tobacco: Never Used  Vaping Use   Vaping Use: Never used  Substance and Sexual Activity   Alcohol use: No    Alcohol/week: 0.0 standard drinks   Drug use: No   Sexual activity: Yes    Birth control/protection: Other-see comments, Post-menopausal    Comment: Hysterectomy  Other Topics Concern   Not on file  Social History Narrative   Not on file   Social Determinants of Health   Financial Resource Strain: Low Risk    Difficulty of Paying Living Expenses: Not hard at all  Food Insecurity: No Food Insecurity   Worried About Charity fundraiser in the Last Year: Never true   [place] in the Last Year: Never true  Transportation Needs: No Transportation Needs   Lack of Transportation (Medical): No   Lack of Transportation (Non-Medical): No  Physical Activity: Insufficiently Active   Days of Exercise per Week: 1 day   Minutes of Exercise per Session: 30 min  Stress: No Stress Concern Present   Feeling of Stress : Not at all  Social Connections:  Socially Integrated   Frequency of Communication with Friends and Family: Three times a week   Frequency of Social Gatherings with Friends and Family: More than three times a week   Attends Religious Services: More than 4 times per year   Active Member of Clubs or Organizations: Yes   Attends Music therapist: More than 4 times per year   Marital Status: Married  Human resources officer Violence: Not At Risk   Fear of Current or Ex-Partner: No   Emotionally Abused: No   Physically Abused: No   Sexually Abused: No     Physical Exam: BP 110/60    Pulse 80    Ht 5' 2"  (1.575 m)    Wt 182 lb (82.6 kg)    SpO2 96%    BMI 33.29 kg/m  Constitutional: generally well-appearing Psychiatric: alert and oriented x3 Abdomen: soft, nontender, nondistended, no obvious ascites, no peritoneal signs, normal bowel sounds No peripheral edema noted in lower extremities  Assessment and plan: 69 y.o. female with cirrhosis, fairly well compensated  Meld score 2 months ago was 9 which is nice and low and we discussed that.  I am calling in a prescription of iron that has better side effect profile for her, nu iron 150 mg pills 1 pill once daily indefinitely.  I am also calling her in Zofran 4 mg pills 1 pill twice daily as needed for nausea.  She will get repeat set of labs including a repeat hemoglobin A1c and CBC to see if her blood counts are still low even though she has been taking iron supplement a bit more regularly since her last blood count 2 months ago.  She with the very least return to see me in 6 months and have routine cirrhosis labs and ultrasound done shortly prior to that visit.  Please see the "Patient Instructions" section for addition details about the plan.  Owens Loffler, MD [facility] 10/23/2020, 2:06 PM   Total time on date of encounter was 62mnutes (this included time spent preparing to see the patient reviewing records; obtaining and/or reviewing  separately obtained history; performing a medically appropriate exam and/or evaluation; counseling  and educating the patient and family if present; ordering medications, tests or procedures if applicable; and documenting clinical information in the health record).

## 2020-10-26 ENCOUNTER — Ambulatory Visit: Payer: Medicare PPO | Admitting: Family Medicine

## 2020-10-26 ENCOUNTER — Other Ambulatory Visit: Payer: Self-pay

## 2020-10-26 ENCOUNTER — Encounter: Payer: Self-pay | Admitting: Family Medicine

## 2020-10-26 VITALS — BP 130/64 | HR 85 | Temp 96.0°F | Ht 62.0 in | Wt 182.9 lb

## 2020-10-26 DIAGNOSIS — B369 Superficial mycosis, unspecified: Secondary | ICD-10-CM

## 2020-10-26 MED ORDER — FLUCONAZOLE 100 MG PO TABS: 100.0000 mg | ORAL_TABLET | Freq: Every day | ORAL | 0 refills | Status: DC

## 2020-10-26 NOTE — Patient Instructions (Signed)
Skin Yeast Infection  A skin yeast infection is a condition in which there is an overgrowth of yeast (candida) that normally lives on the skin. This condition usually occurs in areas of the skin that are constantly warm and moist, such as the armpits or the groin. What are the causes? This condition is caused by a change in the normal balance of the yeast and bacteria that live on the skin. What increases the risk? You are more likely to develop this condition if you:  Are obese.  Are pregnant.  Take birth control pills.  Have diabetes.  Take antibiotic medicines.  Take steroid medicines.  Are malnourished.  Have a weak body defense system (immune system).  Are 76 years of age or older.  Wear tight clothing. What are the signs or symptoms? The most common symptom of this condition is itchiness in the affected area. Other symptoms include:  Red, swollen area of the skin.  Bumps on the skin. How is this diagnosed?  This condition is diagnosed with a medical history and physical exam.  Your health care provider may check for yeast by taking light scrapings of the skin to be viewed under a microscope. How is this treated? This condition is treated with medicine. Medicines may be prescribed or be available over the counter. The medicines may be:  Taken by mouth (orally).  Applied as a cream or powder to your skin. Follow these instructions at home:   Take or apply over-the-counter and prescription medicines only as told by your health care provider.  Maintain a healthy weight. If you need help losing weight, talk with your health care provider.  Keep your skin clean and dry.  If you have diabetes, keep your blood sugar under control.  Keep all follow-up visits as told by your health care provider. This is important. Contact a health care provider if:  Your symptoms go away and then return.  Your symptoms do not get better with treatment.  Your symptoms get  worse.  Your rash spreads.  You have a fever or chills.  You have new symptoms.  You have new warmth or redness of your skin. Summary  A skin yeast infection is a condition in which there is an overgrowth of yeast (candida) that normally lives on the skin. This condition is caused by a change in the normal balance of the yeast and bacteria that live on the skin.  Take or apply over-the-counter and prescription medicines only as told by your health care provider.  Keep your skin clean and dry.  Contact a health care provider if your symptoms do not get better with treatment. This information is not intended to replace advice given to you by your health care provider. Make sure you discuss any questions you have with your health care provider. Document Revised: 04/10/2018 Document Reviewed: 04/10/2018 Elsevier Patient Education  [place].

## 2020-10-26 NOTE — Progress Notes (Signed)
Established Patient Office Visit  Subjective:  Patient ID: Dana Dyer, female    DOB: November 16, 1951  Age: 69 y.o. MRN: [id number]  CC:  Chief Complaint  Patient presents with   Rash    C/O yeast in fold at stomach x 3 weeks becoming worse.     HPI Dana Dyer presents for mildly pruritic rash lower abdominal region and a skin fold.  She has had similar infections in the past which required fluconazole to clear up.  She is tried over-the-counter Monistat without much improvement.  She had some leftover betamethasone steroid cream which also has not helped.  She does have type 2 diabetes followed by endocrinology with recent A1c 8.2%.  She tries to keep this area as dry as possible.  Past Medical History:  Diagnosis Date   ALLERGIC RHINITIS 10/09/2007   Allergy    Arthritis    hips, spine   DIABETES MELLITUS, UNCONTROLLED 03/16/2009   FATIGUE 10/21/2008   FIBROMYALGIA 10/09/2007   GERD (gastroesophageal reflux disease)    diet  controlled, no meds   H/O vaginal hysterectomy    HYPERLIPIDEMIA 10/09/2007   diet controlled, no meds   HYPERTENSION 10/09/2007   Morton's neuroma    OBSTRUCTIVE SLEEP APNEA 10/09/2007   Plantar fasciitis    History - right   Sleep apnea    Uses CPAP   TRANSAMINASES, SERUM, ELEVATED 07/08/2009    Past Surgical History:  Procedure Laterality Date   ABDOMINAL HYSTERECTOMY  1979   prolapsed uterus   BASAL CELL CARCINOMA EXCISION     BREAST BIOPSY     left 2012 stereo   BREAST REDUCTION SURGERY Bilateral 1992   COLONOSCOPY  2006   [place]   mamoplasty     reduction   REDUCTION MAMMAPLASTY Bilateral    TONSILLECTOMY  1959   TUBAL LIGATION  1976    Family History  Problem Relation Age of Onset   Diabetes Mother    Arthritis Mother    Cancer Mother        breast   Breast cancer Mother 79   Alcohol abuse Father    Arthritis Father    Hypertension Maternal Grandfather    Miscarriages / Stillbirths  Paternal Grandfather    Breast cancer Maternal Aunt        diagnosed in her 50's   Colon polyps Neg Hx    Colon cancer Neg Hx    Esophageal cancer Neg Hx    Rectal cancer Neg Hx    Stomach cancer Neg Hx     Social History   Socioeconomic History   Marital status: Married    Spouse name: Not on file   Number of children: Not on file   Years of education: Not on file   Highest education level: Not on file  Occupational History   Not on file  Tobacco Use   Smoking status: Never Smoker   Smokeless tobacco: Never Used  Vaping Use   Vaping Use: Never used  Substance and Sexual Activity   Alcohol use: No    Alcohol/week: 0.0 standard drinks   Drug use: No   Sexual activity: Yes    Birth control/protection: Other-see comments, Post-menopausal    Comment: Hysterectomy  Other Topics Concern   Not on file  Social History Narrative   Not on file   Social Determinants of Health   Financial Resource Strain: Low Risk    Difficulty of Paying Living Expenses: Not hard at all  Food  Insecurity: No Food Insecurity   Worried About Charity fundraiser in the Last Year: Never true   Ran Out of Food in the Last Year: Never true  Transportation Needs: No Transportation Needs   Lack of Transportation (Medical): No   Lack of Transportation (Non-Medical): No  Physical Activity: Insufficiently Active   Days of Exercise per Week: 1 day   Minutes of Exercise per Session: 30 min  Stress: No Stress Concern Present   Feeling of Stress : Not at all  Social Connections: Socially Integrated   Frequency of Communication with Friends and Family: Three times a week   Frequency of Social Gatherings with Friends and Family: More than three times a week   Attends Religious Services: More than 4 times per year   Active Member of Clubs or Organizations: Yes   Attends Music therapist: More than 4 times per year   Marital Status: Married  Human resources officer  Violence: Not At Risk   Fear of Current or Ex-Partner: No   Emotionally Abused: No   Physically Abused: No   Sexually Abused: No    Outpatient Medications Prior to Visit  Medication Sig Dispense Refill   amLODipine (NORVASC) 5 MG tablet TAKE 1 TABLET BY MOUTH EVERY DAY 90 tablet 0   betamethasone dipropionate 0.05 % cream      Continuous Blood Gluc Receiver (DEXCOM G6 RECEIVER) DEVI 1 Device by Does not apply route as directed. 1 each 0   Continuous Blood Gluc Sensor (DEXCOM G6 SENSOR) MISC 1 Device by Does not apply route as directed. 3 each 11   Continuous Blood Gluc Transmit (DEXCOM G6 TRANSMITTER) MISC 1 Device by Does not apply route as directed. 1 each 3   DULoxetine (CYMBALTA) 20 MG capsule TAKE 1 CAPSULE BY MOUTH EVERY DAY 90 capsule 0   hydrochlorothiazide (HYDRODIURIL) 25 MG tablet TAKE 1 TABLET BY MOUTH EVERY DAY 90 tablet 1   insulin glargine (LANTUS SOLOSTAR) 100 UNIT/ML Solostar Pen Inject 30 Units into the skin daily. 45 mL 3   insulin lispro (HUMALOG KWIKPEN) 100 UNIT/ML KwikPen Inject 0.1 mLs (10 Units total) into the skin daily with breakfast AND 0.15 mLs (15 Units total) daily with lunch AND 0.15 mLs (15 Units total) daily with supper. 45 mL 3   Insulin Pen Needle 32G X 4 MM MISC 1 Device by Does not apply route as directed. 400 each 3   Lancets (ONETOUCH DELICA PLUS [id number]) MISC USE TO CHECK BLOOD GLUCOSE 3 TIMES A DAY 300 each 1   losartan (COZAAR) 100 MG tablet TAKE 1 TABLET BY MOUTH EVERY DAY 90 tablet 1   metFORMIN (GLUCOPHAGE) 1000 MG tablet TAKE 1 TABLET BY MOUTH TWICE A DAY 180 tablet 3   MISC NATURAL PRODUCTS PO Take by mouth.      nadolol (CORGARD) 40 MG tablet Take 1 tablet (40 mg total) by mouth daily. 30 tablet 11   ONETOUCH VERIO test strip TEST 3 TIMES DAILY DX E11.9 300 each 1   iron polysaccharides (NU-IRON) 150 MG capsule Take 1 capsule (150 mg total) by mouth daily. (Patient not taking: Reported on 10/26/2020) 30 capsule 11    ondansetron (ZOFRAN) 4 MG tablet Take 1 tablet (4 mg total) by mouth 2 (two) times daily as needed for nausea. (Patient not taking: Reported on 10/26/2020) 50 tablet 3   rosuvastatin (CRESTOR) 10 MG tablet TAKE 1 TABLET BY MOUTH EVERY DAY (Patient not taking: Reported on 10/26/2020) 90 tablet 2  Facility-Administered Medications Prior to Visit  Medication Dose Route Frequency Provider Last Rate Last Admin   TDaP (BOOSTRIX) injection 0.5 mL  0.5 mL Intramuscular Once , Alinda Sierras, MD        Allergies  Allergen Reactions   Amoxicillin-Pot Clavulanate     Severe diarrhea   Codeine Sulfate     REACTION: "hyper"   Erythromycin Base    Invokana [Canagliflozin] Other (See Comments)    Recurrent yeast infections   Irbesartan-Hydrochlorothiazide    Latex    Victoza [Liraglutide] Nausea Only   Ozempic (0.25 Or 0.5 Mg-Dose) [Semaglutide(0.25 Or 0.84m-Dos)] Nausea Only    ROS Review of Systems  Constitutional: Negative for chills and fever.  Skin: Positive for rash.      Objective:    Physical Exam Vitals reviewed.  Constitutional:      Appearance: Normal appearance.  Cardiovascular:     Rate and Rhythm: Normal rate and regular rhythm.  Pulmonary:     Effort: Pulmonary effort is normal.     Breath sounds: Normal breath sounds.  Skin:    Findings: Rash present.     Comments: She has faintly erythematous macular rash lower abdominal region just underneath the skin fold.  This spreads bilaterally.  No pustules.  Neurological:     Mental Status: She is alert.     BP 130/64    Pulse 85    Temp (!) 96 F (35.6 C) (Tympanic)    Ht 5' 2"  (1.575 m)    Wt 182 lb 14.4 oz (83 kg)    SpO2 96%    BMI 33.45 kg/m  Wt Readings from Last 3 Encounters:  10/26/20 182 lb 14.4 oz (83 kg)  10/23/20 182 lb (82.6 kg)  09/16/20 178 lb 9.6 oz (81 kg)     Health Maintenance Due  Topic Date Due   COVID-19 Vaccine (2 - Moderna 2-dose series) 02/15/2020   FOOT EXAM  05/29/2020     INFLUENZA VACCINE  07/05/2020    There are no preventive care reminders to display for this patient.  Lab Results  Component Value Date   TSH 1.48 07/05/2019   Lab Results  Component Value Date   WBC 5.4 10/23/2020   HGB 10.2 (L) 10/23/2020   HCT 32.4 (L) 10/23/2020   MCV 69.1 Repeated and verified X2. (L) 10/23/2020   PLT 130.0 (L) 10/23/2020   Lab Results  Component Value Date   NA 139 08/31/2020   K 3.7 08/31/2020   CO2 33 (H) 08/31/2020   GLUCOSE 186 (H) 08/31/2020   BUN 11 08/31/2020   CREATININE 0.58 08/31/2020   BILITOT 1.2 08/31/2020   ALKPHOS 81 08/31/2020   AST 36 08/31/2020   ALT 33 08/31/2020   PROT 7.1 08/31/2020   ALBUMIN 3.6 08/31/2020   CALCIUM 9.4 08/31/2020   GFR 102.47 08/31/2020   Lab Results  Component Value Date   CHOL 204 (H) 08/27/2019   Lab Results  Component Value Date   HDL 59.50 08/27/2019   Lab Results  Component Value Date   LDLCALC 126 (H) 08/27/2019   Lab Results  Component Value Date   TRIG 94.0 08/27/2019   Lab Results  Component Value Date   CHOLHDL 3 08/27/2019   Lab Results  Component Value Date   HGBA1C 8.2 (H) 10/23/2020      Assessment & Plan:   Problem List Items Addressed This Visit    None    Visit Diagnoses    Fungal rash of  trunk    -  Primary   Relevant Medications   fluconazole (DIFLUCAN) 100 MG tablet    -keep area dry as possible -we went ahead and sent in fluconazole 100 mg daily for 7 days which she is used in the past with good success with this has been resistant to topical antifungals. -Be in touch if this is not fully clearing in the next couple of weeks  Meds ordered this encounter  Medications   fluconazole (DIFLUCAN) 100 MG tablet    Sig: Take 1 tablet (100 mg total) by mouth daily.    Dispense:  7 tablet    Refill:  0    Follow-up: No follow-ups on file.    Carolann Littler, MD

## 2020-11-02 ENCOUNTER — Other Ambulatory Visit: Payer: Self-pay | Admitting: Family Medicine

## 2020-11-02 ENCOUNTER — Other Ambulatory Visit: Payer: Self-pay | Admitting: Gastroenterology

## 2020-11-02 ENCOUNTER — Other Ambulatory Visit: Payer: Self-pay | Admitting: Internal Medicine

## 2020-11-11 ENCOUNTER — Other Ambulatory Visit: Payer: Self-pay | Admitting: Family Medicine

## 2020-11-11 NOTE — Telephone Encounter (Signed)
Patient said the yeast seemed to clear up for a couple days but is back.

## 2020-11-11 NOTE — Telephone Encounter (Signed)
Refill once 

## 2020-12-15 DIAGNOSIS — D649 Anemia, unspecified: Secondary | ICD-10-CM | POA: Diagnosis not present

## 2020-12-15 DIAGNOSIS — Z124 Encounter for screening for malignant neoplasm of cervix: Secondary | ICD-10-CM | POA: Diagnosis not present

## 2020-12-15 DIAGNOSIS — Z01419 Encounter for gynecological examination (general) (routine) without abnormal findings: Secondary | ICD-10-CM | POA: Diagnosis not present

## 2020-12-15 DIAGNOSIS — N819 Female genital prolapse, unspecified: Secondary | ICD-10-CM | POA: Diagnosis not present

## 2020-12-15 DIAGNOSIS — Z6833 Body mass index (BMI) 33.0-33.9, adult: Secondary | ICD-10-CM | POA: Diagnosis not present

## 2021-01-01 ENCOUNTER — Other Ambulatory Visit: Payer: Self-pay | Admitting: Family Medicine

## 2021-02-16 ENCOUNTER — Telehealth: Payer: Self-pay

## 2021-02-16 DIAGNOSIS — K7460 Unspecified cirrhosis of liver: Secondary | ICD-10-CM

## 2021-02-16 NOTE — Telephone Encounter (Signed)
Phone call to patient to make her aware of scheduled appointmens and lab work in May 2022.  Message also sent to patient in [place].  You have been scheduled for an abdominal ultrasound at [facility] (1st floor of hospital) on 04-28-2021 at [place]. Please arrive 30 minutes prior to your appointment for registration. Make certain not to have anything to eat or drink after midnight the night prior to your appointment. Should you need to reschedule your appointment, please contact radiology at [phone]. This test typically takes about 30 minutes to perform.  Your provider has requested that you go to the basement level of our building for lab work after having ultrasound at [facility]. Press "B" on the elevator. The lab is located at the first door on the left as you exit the elevator.  You are scheduled to follow up with Dr Ardis Hughs after ultrasound and lab work on 05-05-2021 at 2:10pm.  Please arrive at 2:00pm to complete registration process at front desk.

## 2021-02-16 NOTE — Telephone Encounter (Signed)
-----   Message from Stevan Born, [place] sent at 10/23/2020  2:21 PM EST ----- Regarding: liver ultrasound, labs, and follow up appt Patient needs liver ultrasound cbc cmet afp and PT/INR then 6 mon flup with DJ in May 2022 dx cirrohsis

## 2021-02-25 ENCOUNTER — Other Ambulatory Visit: Payer: Self-pay

## 2021-03-01 ENCOUNTER — Encounter: Payer: Self-pay | Admitting: Internal Medicine

## 2021-03-01 ENCOUNTER — Ambulatory Visit: Payer: Medicare PPO | Admitting: Internal Medicine

## 2021-03-01 ENCOUNTER — Other Ambulatory Visit: Payer: Self-pay

## 2021-03-01 VITALS — BP 136/86 | HR 88 | Ht 62.0 in | Wt 184.5 lb

## 2021-03-01 DIAGNOSIS — E1165 Type 2 diabetes mellitus with hyperglycemia: Secondary | ICD-10-CM | POA: Diagnosis not present

## 2021-03-01 DIAGNOSIS — Z794 Long term (current) use of insulin: Secondary | ICD-10-CM | POA: Diagnosis not present

## 2021-03-01 LAB — POCT GLUCOSE (DEVICE FOR HOME USE): POC Glucose: 164 mg/dl — AB (ref 70–99)

## 2021-03-01 LAB — POCT GLYCOSYLATED HEMOGLOBIN (HGB A1C): Hemoglobin A1C: 8.5 % — AB (ref 4.0–5.6)

## 2021-03-01 MED ORDER — DEXCOM G6 TRANSMITTER MISC
1.0000 | 3 refills | Status: DC
Start: 2021-03-01 — End: 2021-10-06

## 2021-03-01 MED ORDER — DEXCOM G6 SENSOR MISC
1.0000 | 11 refills | Status: DC
Start: 2021-03-01 — End: 2022-05-09

## 2021-03-01 NOTE — Patient Instructions (Addendum)
-   Continue Lantus 34 units daily  - Continue Humalog 10 units with Breakfast and 18  units  Lunch and supper - Continue Metformin 1000 mg, 1 tablet twice daily  - Humalog correctional insulin: ADD extra units on insulin to your meal-time Humalog  dose if your blood sugars are higher than 165. Use the scale below to help guide you:   Blood sugar before meal Number of units to inject  Less than 165 0 unit  166 -  190 1 units  191 -  215 2 units  216 -  240 3 units  241 -  265 4 units  266 -  290 5 units  291 -  315 6 units  316 -  340 7 units  341 -  365 8 units  365 - 390 9 units   391- 415 10 units  416- 440 11 units  441- 465 12 units        HOW TO TREAT LOW BLOOD SUGARS (Blood sugar LESS THAN 70 MG/DL)  Please follow the RULE OF 15 for the treatment of hypoglycemia treatment (when your (blood sugars are less than 70 mg/dL)    STEP 1: Take 15 grams of carbohydrates when your blood sugar is low, which includes:   3-4 GLUCOSE TABS  OR  3-4 OZ OF JUICE OR REGULAR SODA OR  ONE TUBE OF GLUCOSE GEL     STEP 2: RECHECK blood sugar in 15 MINUTES STEP 3: If your blood sugar is still low at the 15 minute recheck --> then, go back to STEP 1 and treat AGAIN with another 15 grams of carbohydrates.

## 2021-03-01 NOTE — Progress Notes (Signed)
Name: Dana Dyer  Age/ Sex: 70 y.o., female   MRN/ DOB: [id number], 24-Mar-1951     PCP: Eulas Post, MD   Reason for Endocrinology Evaluation: Type 2 Diabetes Mellitus  Initial Endocrine Consultative Visit: 10/23/18    PATIENT IDENTIFIER: Dana Dyer is a 70 y.o. female with a past medical history of HTN, T2DM, Cirrhosis, Fibromyalgia and dyslipidemia . The patient has followed with Endocrinology clinic since 10/23/18 for consultative assistance with management of her diabetes.  DIABETIC HISTORY:  Dana Dyer was diagnosed with T2DM in 2005. She used to be on SU but does not recall intolerance. She is intolerant to victoza and invokana. She was started on insulin therapy in 2009. Her hemoglobin A1c has ranged from 7.8 % in 2012, peaking at 10.9% in 2018.   Ozempic was stopped in 06/2019 due to persistent nausea.   Switched pravastatin to crestor 09/2019  SUBJECTIVE:   During the last visit (04/29/2020): A1c was 6.7 %. We continued metformin and adjusted MDI regimen   Today (03/01/2021): Dana Dyer is here for a 4 month follow up on diabetes management.  She checks her blood sugars 1 times daily. The patient has not had hypoglycemic episodes since the last clinic visit per  Nausea has improved dramatically, uses famotidine    Has occasional LE edema   Last eye exam summer 2021   HOME DIABETES REGIMEN:  Lantus 34 units daily Humalog 10 units with Breakfast  Humalog 15 units with lunch and supper - 16-18 units  Metformin 1000 mg BID  Crestor 10 mg daily - 1-3 x a week - restarted a couple of weeks ago      METER DOWNLOAD SUMMARY: 9/30-10/13/2021 Average Number Tests=  12 Overall Mean FS Glucose = 149 SD= 23   BG Ranges: Low = 102 High = 186   Hypoglycemic Events/30 Days: BG < 50 = 0 Episodes of symptomatic severe hypoglycemia =0     HISTORY:  Past Medical History:  Past Medical History:  Diagnosis Date  . ALLERGIC RHINITIS  10/09/2007  . Allergy   . Arthritis    hips, spine  . DIABETES MELLITUS, UNCONTROLLED 03/16/2009  . FATIGUE 10/21/2008  . FIBROMYALGIA 10/09/2007  . GERD (gastroesophageal reflux disease)    diet  controlled, no meds  . H/O vaginal hysterectomy   . HYPERLIPIDEMIA 10/09/2007   diet controlled, no meds  . HYPERTENSION 10/09/2007  . Morton's neuroma   . OBSTRUCTIVE SLEEP APNEA 10/09/2007  . Plantar fasciitis    History - right  . Sleep apnea    Uses CPAP  . TRANSAMINASES, SERUM, ELEVATED 07/08/2009   Past Surgical History:  Past Surgical History:  Procedure Laterality Date  . ABDOMINAL HYSTERECTOMY  1979   prolapsed uterus  . BASAL CELL CARCINOMA EXCISION    . BREAST BIOPSY     left 2012 stereo  . BREAST REDUCTION SURGERY Bilateral 1992  . COLONOSCOPY  2006   [facility]  . mamoplasty     reduction  . REDUCTION MAMMAPLASTY Bilateral   . TONSILLECTOMY  1959  . TUBAL LIGATION  1976    Social History:  reports that she has never smoked. She has never used smokeless tobacco. She reports that she does not drink alcohol and does not use drugs. Family History:  Family History  Problem Relation Age of Onset  . Diabetes Mother   . Arthritis Mother   . Cancer Mother        breast  . Breast  cancer Mother 32  . Alcohol abuse Father   . Arthritis Father   . Hypertension Maternal Grandfather   . Miscarriages / Stillbirths Paternal Grandfather   . Breast cancer Maternal Aunt        diagnosed in her 8's  . Colon polyps Neg Hx   . Colon cancer Neg Hx   . Esophageal cancer Neg Hx   . Rectal cancer Neg Hx   . Stomach cancer Neg Hx      HOME MEDICATIONS: Allergies as of 03/01/2021      Reactions   Amoxicillin-pot Clavulanate    Severe diarrhea   Codeine Sulfate    REACTION: "hyper"   Erythromycin Base    Invokana [canagliflozin] Other (See Comments)   Recurrent yeast infections   Irbesartan-hydrochlorothiazide    Latex    Victoza [liraglutide] Nausea Only   Ozempic (0.25 Or  0.5 Mg-dose) [semaglutide(0.25 Or 0.60m-dos)] Nausea Only      Medication List       Accurate as of March 01, 2021  8:54 AM. If you have any questions, ask your nurse or doctor.        STOP taking these medications   fluconazole 100 MG tablet Commonly known as: DIFLUCAN Stopped by: IDorita Sciara MD     TAKE these medications   amLODipine 5 MG tablet Commonly known as: NORVASC TAKE 1 TABLET BY MOUTH EVERY DAY   betamethasone dipropionate 0.05 % cream   Dexcom G6 Receiver Devi 1 Device by Does not apply route as directed.   Dexcom G6 Sensor Misc 1 Device by Does not apply route as directed.   Dexcom G6 Transmitter Misc 1 Device by Does not apply route as directed.   DULoxetine 20 MG capsule Commonly known as: CYMBALTA TAKE 1 CAPSULE BY MOUTH EVERY DAY   hydrochlorothiazide 25 MG tablet Commonly known as: HYDRODIURIL TAKE 1 TABLET BY MOUTH EVERY DAY   insulin lispro 100 UNIT/ML KwikPen Commonly known as: HumaLOG KwikPen Inject 0.1 mLs (10 Units total) into the skin daily with breakfast AND 0.15 mLs (15 Units total) daily with lunch AND 0.15 mLs (15 Units total) daily with supper.   Insulin Pen Needle 32G X 4 MM Misc 1 Device by Does not apply route as directed.   iron polysaccharides 150 MG capsule Commonly known as: Nu-Iron Take 1 capsule (150 mg total) by mouth daily.   Lantus SoloStar 100 UNIT/ML Solostar Pen Generic drug: insulin glargine Inject 30 Units into the skin daily.   losartan 100 MG tablet Commonly known as: COZAAR TAKE 1 TABLET BY MOUTH EVERY DAY   metFORMIN 1000 MG tablet Commonly known as: GLUCOPHAGE TAKE 1 TABLET BY MOUTH TWICE A DAY   MISC NATURAL PRODUCTS PO Take by mouth.   nadolol 40 MG tablet Commonly known as: CORGARD TAKE 1 TABLET BY MOUTH EVERY DAY   ondansetron 4 MG tablet Commonly known as: Zofran Take 1 tablet (4 mg total) by mouth 2 (two) times daily as needed for nausea.   OneTouch Delica Plus [id number] Misc USE TO CHECK BLOOD GLUCOSE 3 TIMES A DAY   OneTouch Verio test strip Generic drug: glucose blood TEST 3 TIMES DAILY DX E11.9   rosuvastatin 10 MG tablet Commonly known as: CRESTOR TAKE 1 TABLET BY MOUTH EVERY DAY        OBJECTIVE:   Vital Signs: BP 136/86   Pulse 88   Ht 5' 2"  (1.575 m)   Wt 184 lb 8 oz (83.7 kg)  SpO2 98%   BMI 33.75 kg/m   Wt Readings from Last 3 Encounters:  03/01/21 184 lb 8 oz (83.7 kg)  10/26/20 182 lb 14.4 oz (83 kg)  10/23/20 182 lb (82.6 kg)     Exam: General: Pt appears well and is in NAD  Lungs: Clear with good BS bilat with no rales, rhonchi, or wheezes  Heart: RRR   Abdomen: Normoactive bowel sounds, soft, nontender, without masses or organomegaly palpable  Extremities: Trace  pretibial edema.  Neuro: MS is good with appropriate affect, pt is alert and Ox3   DM Foot 03/01/2021 The skin of the feet is intact without sores or ulcerations. The pedal pulses are 2+ on right and 2+ on left. The sensation is decreased  to a screening 5.07, 10 gram monofilament on the left      DATA REVIEWED:  Lab Results  Component Value Date   HGBA1C 8.5 (A) 03/01/2021   HGBA1C 8.2 (H) 10/23/2020   HGBA1C 8.4 (A) 09/16/2020   Lab Results  Component Value Date   MICROALBUR <0.7 09/09/2019   LDLCALC 126 (H) 08/27/2019   CREATININE 0.58 08/31/2020   Results for Dana Dyer, Dana Dyer (MRN [id number]) as of 03/01/2021 08:58  Ref. Range 08/31/2020 10:24  Sodium Latest Ref Range: 135 - 145 mEq/L 139  Potassium Latest Ref Range: 3.5 - 5.1 mEq/L 3.7  Chloride Latest Ref Range: 96 - 112 mEq/L 97  CO2 Latest Ref Range: 19 - 32 mEq/L 33 (H)  Glucose Latest Ref Range: 70 - 99 mg/dL 186 (H)  BUN Latest Ref Range: 6 - 23 mg/dL 11  Creatinine Latest Ref Range: 0.40 - 1.20 mg/dL 0.58  Calcium Latest Ref Range: 8.4 - 10.5 mg/dL 9.4  Alkaline Phosphatase Latest Ref Range: 39 - 117 U/L 81  Albumin Latest Ref Range: 3.5 - 5.2 g/dL 3.6  AST Latest Ref Range: 0 -  37 U/L 36  ALT Latest Ref Range: 0 - 35 U/L 33  Total Protein Latest Ref Range: 6.0 - 8.3 g/dL 7.1  Total Bilirubin Latest Ref Range: 0.2 - 1.2 mg/dL 1.2  GFR Latest Ref Range: >60.00 mL/min 102.47   In Office Bg 164 mg/dL    ASSESSMENT / PLAN / RECOMMENDATIONS:   1) Type 2 Diabetes Mellitus, Poorly controlled, With neuropathic complications - Most recent A1c of 8.5%. Goal A1c < 7.0 %.    - A1c stable  - I am going to prescribed a CGM again to [facility] this time, as she checks once daily , hence limited data.   - She is intolerant to GLP-1 agonists and SGLT-2 inhibitors.  - No changes today due to lack of sufficient glucose data but I suspect is insulin to carb mismatch  - IN the meantime she was encouraged to check glucose before meals and use the correction scale as needed for humalog adjustments.  - She will let me know if she receives her Dexcom and I will  Refer her to our RD   MEDICATIONS: - Continue Lantus 34 units daily - Continue Humalog 10 units with breakfast, 18 units with Lunch and Supper  - Metformin 1000 mg BID  - CF : HUmalog ( BG -140/25)    EDUCATION / INSTRUCTIONS:  BG monitoring instructions: Patient is instructed to check her blood sugars 4 times a day, before meals and bedtime.  Call [facility] if: BG persistently < 70  . I reviewed the Rule of 15 for the treatment of hypoglycemia in detail with the  patient. Literature supplied.   2) Dyslipidemia:  She has not been able to tolerate daily crestor due to myalgia . She was advised to try taking crestor  1-3x a week, she restarted recently.  I have advised her to try Vitamin D 1000 iu daily   Medication Crestor 10 mg daily      F/U in 3 months    Signed electronically by: Mack Guise, MD  [facility]  [facility], [place] [address] Phone: [phone] FAX: [phone]   CC: Eulas Post, [address]  [place] [address] Phone: [phone]  Fax: [phone]  Return to Endocrinology clinic as below: Future Appointments  Date Time Provider [place]  04/28/2021  9:00 AM [facility]-US 1 [facility]US [place]  05/05/2021  2:10 PM Milus Banister, MD [facility]

## 2021-03-10 NOTE — Telephone Encounter (Addendum)
Patient is looking to get a sooner appointment in the office there is an availability on 04/21/21 but would also need her [place] changed as well.

## 2021-03-10 NOTE — Telephone Encounter (Signed)
Patient are that ultrasound has been changed to 04-13-2021 at 8:00am and visit with Dr Ardis Hughs was rescheduled to 04-21-2021 at 8:50am.  Patient agreed to plan and verbalized understanding. No further questions.

## 2021-03-11 ENCOUNTER — Other Ambulatory Visit: Payer: Self-pay | Admitting: Family Medicine

## 2021-03-11 ENCOUNTER — Telehealth: Payer: Self-pay | Admitting: Internal Medicine

## 2021-03-11 MED ORDER — ONETOUCH VERIO VI STRP: ORAL_STRIP | 1 refills | Status: DC

## 2021-03-11 NOTE — Telephone Encounter (Signed)
MEDICATION: One Touch Test Strips  PHARMACY:  [facility] [address] CONTACTED THEIR PHARMACY?  yes  IS THIS A 90 DAY SUPPLY :   IS PATIENT OUT OF MEDICATION: yes  IF NOT; HOW MUCH IS LEFT:   LAST APPOINTMENT DATE: @3 /28/2022  NEXT APPOINTMENT DATE:@6 /29/2022  DO WE HAVE YOUR PERMISSION TO LEAVE A DETAILED MESSAGE?: yes  OTHER COMMENTS:    **Let patient know to contact pharmacy at the end of the day to make sure medication is ready. **  ** Please notify patient to allow 48-72 hours to process**  **Encourage patient to contact the pharmacy for refills or they can request refills through [facility]**

## 2021-03-11 NOTE — Telephone Encounter (Signed)
Sent MyChart message to see if she was using medication.

## 2021-03-11 NOTE — Telephone Encounter (Signed)
Refill sent.

## 2021-03-12 DIAGNOSIS — E1165 Type 2 diabetes mellitus with hyperglycemia: Secondary | ICD-10-CM | POA: Diagnosis not present

## 2021-03-16 ENCOUNTER — Telehealth: Payer: Self-pay | Admitting: Internal Medicine

## 2021-03-16 DIAGNOSIS — E1165 Type 2 diabetes mellitus with hyperglycemia: Secondary | ICD-10-CM

## 2021-03-16 DIAGNOSIS — Z794 Long term (current) use of insulin: Secondary | ICD-10-CM

## 2021-03-16 NOTE — Telephone Encounter (Signed)
Patient called to advise that she received the [facility] G6 and was told to call when received.  Is ready to set up training - [phone]

## 2021-03-17 NOTE — Telephone Encounter (Signed)
RD referral placed

## 2021-04-12 NOTE — Telephone Encounter (Signed)
Patient called requesting to speak with someone prior to her [place] scheduled for tomorrow.

## 2021-04-12 NOTE — Telephone Encounter (Signed)
Left message on machine to call back  

## 2021-04-12 NOTE — Telephone Encounter (Signed)
Returned call to patient regarding RUQ U/S scheduled for tomorrow morning.  Patient states at last office visit with Dr Ardis Hughs, she was advised to contact our office if she had 3 days of consecutive abdominal pain.  Patient states since visit she has had intermittent RLQ and LLQ abdominal pain. She complains this usually occurs when patient eats chocolate.  Patient concerned that she may need further testing to rule out diverticulosis.

## 2021-04-13 ENCOUNTER — Ambulatory Visit (HOSPITAL_COMMUNITY)
Admission: RE | Admit: 2021-04-13 | Discharge: 2021-04-13 | Disposition: A | Discharge status: Home / Self Care | Payer: Medicare PPO | Source: Ambulatory Visit | Attending: Gastroenterology | Admitting: Gastroenterology

## 2021-04-13 ENCOUNTER — Other Ambulatory Visit (INDEPENDENT_AMBULATORY_CARE_PROVIDER_SITE_OTHER): Payer: Medicare PPO

## 2021-04-13 ENCOUNTER — Other Ambulatory Visit: Payer: Self-pay

## 2021-04-13 DIAGNOSIS — K7460 Unspecified cirrhosis of liver: Secondary | ICD-10-CM

## 2021-04-13 DIAGNOSIS — R188 Other ascites: Secondary | ICD-10-CM | POA: Diagnosis not present

## 2021-04-13 LAB — COMPREHENSIVE METABOLIC PANEL
ALT: 35 U/L (ref 0–35)
AST: 44 U/L — ABNORMAL HIGH (ref 0–37)
Albumin: 3.7 g/dL (ref 3.5–5.2)
Alkaline Phosphatase: 97 U/L (ref 39–117)
BUN: 12 mg/dL (ref 6–23)
CO2: 31 mEq/L (ref 19–32)
Calcium: 9.5 mg/dL (ref 8.4–10.5)
Chloride: 96 mEq/L (ref 96–112)
Creatinine, Ser: 0.58 mg/dL (ref 0.40–1.20)
GFR: 90.9 mL/min (ref >60.00)
Glucose, Bld: 146 mg/dL — ABNORMAL HIGH (ref 70–99)
Potassium: 3.3 mEq/L — ABNORMAL LOW (ref 3.5–5.1)
Sodium: 136 mEq/L (ref 135–145)
Total Bilirubin: 2 mg/dL — ABNORMAL HIGH (ref 0.2–1.2)
Total Protein: 7.3 g/dL (ref 6.0–8.3)

## 2021-04-13 LAB — PROTIME-INR
INR: 1.2 ratio — ABNORMAL HIGH (ref 0.8–1.0)
Prothrombin Time: 13.7 s — ABNORMAL HIGH (ref 9.6–13.1)

## 2021-04-13 LAB — CBC
HCT: 32.1 % — ABNORMAL LOW (ref 36.0–46.0)
Hemoglobin: 10.4 g/dL — ABNORMAL LOW (ref 12.0–15.0)
MCHC: 32.4 g/dL (ref 30.0–36.0)
MCV: 73 fl — ABNORMAL LOW (ref 78.0–100.0)
Platelets: 107 10*3/uL — ABNORMAL LOW (ref 150.0–400.0)
RBC: 4.4 Mil/uL (ref 3.87–5.11)
RDW: 17.6 % — ABNORMAL HIGH (ref 11.5–15.5)
WBC: 5.4 10*3/uL (ref 4.0–10.5)

## 2021-04-13 NOTE — Telephone Encounter (Signed)
Unable to reach the pt prior to the [place].  The pt did arrive at for the [place]. She will discuss further concerns with Dr Ardis Hughs at her upcoming office visit.

## 2021-04-14 LAB — AFP TUMOR MARKER: AFP-Tumor Marker: 4.8 ng/mL

## 2021-04-19 ENCOUNTER — Other Ambulatory Visit: Payer: Self-pay | Admitting: Family Medicine

## 2021-04-19 ENCOUNTER — Other Ambulatory Visit: Payer: Self-pay | Admitting: Internal Medicine

## 2021-04-21 ENCOUNTER — Ambulatory Visit: Payer: Medicare PPO | Admitting: Gastroenterology

## 2021-04-21 ENCOUNTER — Encounter: Payer: Self-pay | Admitting: Gastroenterology

## 2021-04-21 VITALS — BP 108/60 | HR 78 | Ht 62.0 in | Wt 181.0 lb

## 2021-04-21 DIAGNOSIS — K7460 Unspecified cirrhosis of liver: Secondary | ICD-10-CM | POA: Diagnosis not present

## 2021-04-21 MED ORDER — FUROSEMIDE 20 MG PO TABS: 20.0000 mg | ORAL_TABLET | Freq: Every day | ORAL | 11 refills | Status: DC

## 2021-04-21 NOTE — Patient Instructions (Signed)
We have sent the following medications to your pharmacy for you to pick up at your convenience: furosemide 20 mg daily.  Please remember to take your iron at bedtime.   Your provider has requested that you go to the basement level for lab work in 2 weeks (around 05/05/21). Press "B" on the elevator. The lab is located at the first door on the left as you exit the elevator.  Please remain on a low salt diet.

## 2021-04-21 NOTE — Progress Notes (Signed)
Review of pertinent gastrointestinal problems: 1.History of adenomatous polyps in her colon.Colonoscopy 2006 Dr. Timmothy Euler found no polyps. Colonoscopy Dr. Ardis Hughs December 2016 found 7 subcentimeter polyps. These were all adenomatous polyps on pathology. Also documented left-sided diverticulosis. I recommend she have a repeat colonoscopy at 3-year interval.Colonoscopy January 2020 fourpolyps were removed from her colon,all were adenomatous on pathology. She was recommended to have repeat colonoscopy at 3-year interval 2. Cirrhosis, likelyfrom longstanding fatty liver disease, out-of-control diabetes.Diagnosed 2019. Lab workup 2019:Hep C Ab negative, Hep B Surface Ag negative,hepatitis B surface antibody negative, hep B Core Igm negative, Hep A IgM negative. Ferritin normal, HIV negative, ANA negative,antimitochondrial antibody negative,hepatitis a antibody total negative;slightly low platelets, transaminases slightly elevated. No edema.  [place] 07/2018: slightly nodular,suspicious for cirrhosis.[place] 12/2019 cirrhosis without mass lesions.  Ultrasound 08/2020 showed cirrhosis without focal mass lesions.  Gallbladder thick but without Murphy sign.  Ultrasound May 2022 showed cirrhosis, trace perihepatic ascites, no focal lesions in the liver.  AYT0/1601 small distal esophagus varices, mild portal gastropathy, no gastric varices. She was started on nadolol(eventually at 97m daily)  AFP5/2022 4.8 (normal)  MELD11 (04/2021 labs)  Hepatitis A, B vaccination series started 11/2018   HPI: This is a very pleasant 70year old woman  I saw her 5 months ago for liver follow-up.  She was not taking her iron routinely I called her iron prescription new iron 150 mg pills 1 pill once daily indefinitely.  I also called her in Zofran to help with as needed nausea.  Blood work last week shows her hemoglobin is 10.4, MCV 73.,  Platelets 107, meld score 11, INR at 1.2  She has felt well except  for some swelling in her ankles that gets worse throughout the day and fatigue.  She is not taking her iron.  She is having no trouble breathing.  She has a crampy lower abdominal discomforts that improved.  She moves her bowels.  She says this is clearly diet related.  Certain foods will cause it and she avoids those foods she is not bothered.  Her weight is down 1 pound since her last office visit here 6 months ago  ROS: complete GI ROS as described in HPI, all other review negative.  Constitutional:  No unintentional weight loss   Past Medical History:  Diagnosis Date  . ALLERGIC RHINITIS 10/09/2007  . Allergy   . Arthritis    hips, spine  . DIABETES MELLITUS, UNCONTROLLED 03/16/2009  . FATIGUE 10/21/2008  . FIBROMYALGIA 10/09/2007  . GERD (gastroesophageal reflux disease)    diet  controlled, no meds  . H/O vaginal hysterectomy   . HYPERLIPIDEMIA 10/09/2007   diet controlled, no meds  . HYPERTENSION 10/09/2007  . Morton's neuroma   . OBSTRUCTIVE SLEEP APNEA 10/09/2007  . Plantar fasciitis    History - right  . Sleep apnea    Uses CPAP  . TRANSAMINASES, SERUM, ELEVATED 07/08/2009    Past Surgical History:  Procedure Laterality Date  . ABDOMINAL HYSTERECTOMY  1979   prolapsed uterus  . BASAL CELL CARCINOMA EXCISION    . BREAST BIOPSY     left 2012 stereo  . BREAST REDUCTION SURGERY Bilateral 1992  . COLONOSCOPY  2006   [facility]  . mamoplasty     reduction  . REDUCTION MAMMAPLASTY Bilateral   . TONSILLECTOMY  1959  . TUBAL LIGATION  1976    Current Outpatient Medications  Medication Sig Dispense Refill  . amLODipine (NORVASC) 5 MG tablet TAKE 1 TABLET BY MOUTH EVERY  DAY 90 tablet 0  . BD PEN NEEDLE NANO 2ND GEN 32G X 4 MM MISC USE AS DIRECTED 400 each 3  . betamethasone dipropionate 0.05 % cream     . Continuous Blood Gluc Sensor (DEXCOM G6 SENSOR) MISC 1 Device by Does not apply route as directed. 3 each 11  . Continuous Blood Gluc Transmit (DEXCOM G6 TRANSMITTER)  MISC 1 Device by Does not apply route as directed. 1 each 3  . DULoxetine (CYMBALTA) 20 MG capsule TAKE 1 CAPSULE BY MOUTH EVERY DAY 90 capsule 0  . glucose blood (ONETOUCH VERIO) test strip USE TO CHECK BLOOD SUGAR 3 TIMES DAILY DX E11.9 300 each 1  . hydrochlorothiazide (HYDRODIURIL) 25 MG tablet TAKE 1 TABLET BY MOUTH EVERY DAY 90 tablet 0  . insulin glargine (LANTUS SOLOSTAR) 100 UNIT/ML Solostar Pen Inject 30 Units into the skin daily. 45 mL 3  . insulin lispro (HUMALOG KWIKPEN) 100 UNIT/ML KwikPen Inject 0.1 mLs (10 Units total) into the skin daily with breakfast AND 0.15 mLs (15 Units total) daily with lunch AND 0.15 mLs (15 Units total) daily with supper. 45 mL 3  . Lancets (ONETOUCH DELICA PLUS [id number]) MISC USE TO CHECK BLOOD GLUCOSE 3 TIMES A DAY 300 each 1  . losartan (COZAAR) 100 MG tablet TAKE 1 TABLET BY MOUTH EVERY DAY 90 tablet 1  . metFORMIN (GLUCOPHAGE) 1000 MG tablet TAKE 1 TABLET BY MOUTH TWICE A DAY 180 tablet 3  . MISC NATURAL PRODUCTS PO Take by mouth.     . nadolol (CORGARD) 40 MG tablet TAKE 1 TABLET BY MOUTH EVERY DAY 90 tablet 2  . ondansetron (ZOFRAN) 4 MG tablet Take 1 tablet (4 mg total) by mouth 2 (two) times daily as needed for nausea. 50 tablet 3  . rosuvastatin (CRESTOR) 10 MG tablet TAKE 1 TABLET BY MOUTH EVERY DAY 90 tablet 1  . iron polysaccharides (NU-IRON) 150 MG capsule Take 1 capsule (150 mg total) by mouth daily. (Patient not taking: Reported on 04/21/2021) 30 capsule 11   Current Facility-Administered Medications  Medication Dose Route Frequency Provider Last Rate Last Admin  . TDaP (BOOSTRIX) injection 0.5 mL  0.5 mL Intramuscular Once Eulas Post, MD        Allergies as of 04/21/2021 - Review Complete 04/21/2021  Allergen Reaction Noted  . Amoxicillin-pot clavulanate  03/09/2012  . Codeine sulfate  03/16/2009  . Erythromycin base  04/07/2009  . Invokana [canagliflozin] Other (See Comments) 05/18/2015  . Irbesartan-hydrochlorothiazide   04/07/2009  . Latex    . Victoza [liraglutide] Nausea Only 03/10/2016  . Ozempic (0.25 or 0.5 mg-dose) [semaglutide(0.25 or 0.98m-dos)] Nausea Only 09/09/2019    Family History  Problem Relation Age of Onset  . Diabetes Mother   . Arthritis Mother   . Cancer Mother        breast  . Breast cancer Mother 764 . Alcohol abuse Father   . Arthritis Father   . Hypertension Maternal Grandfather   . Miscarriages / Stillbirths Paternal Grandfather   . Breast cancer Maternal Aunt        diagnosed in her 740's . Colon polyps Neg Hx   . Colon cancer Neg Hx   . Esophageal cancer Neg Hx   . Rectal cancer Neg Hx   . Stomach cancer Neg Hx     Social History   Socioeconomic History  . Marital status: Married    Spouse name: Not on file  . Number of children: Not on  file  . Years of education: Not on file  . Highest education level: Not on file  Occupational History  . Not on file  Tobacco Use  . Smoking status: Never Smoker  . Smokeless tobacco: Never Used  Vaping Use  . Vaping Use: Never used  Substance and Sexual Activity  . Alcohol use: No    Alcohol/week: 0.0 standard drinks  . Drug use: No  . Sexual activity: Yes    Birth control/protection: Other-see comments, Post-menopausal    Comment: Hysterectomy  Other Topics Concern  . Not on file  Social History Narrative  . Not on file   Social Determinants of Health   Financial Resource Strain: Low Risk   . Difficulty of Paying Living Expenses: Not hard at all  Food Insecurity: No Food Insecurity  . Worried About Charity fundraiser in the Last Year: Never true  . Ran Out of Food in the Last Year: Never true  Transportation Needs: No Transportation Needs  . Lack of Transportation (Medical): No  . Lack of Transportation (Non-Medical): No  Physical Activity: Insufficiently Active  . Days of Exercise per Week: 1 day  . Minutes of Exercise per Session: 30 min  Stress: No Stress Concern Present  . Feeling of Stress : Not at  all  Social Connections: Socially Integrated  . Frequency of Communication with Friends and Family: Three times a week  . Frequency of Social Gatherings with Friends and Family: More than three times a week  . Attends Religious Services: More than 4 times per year  . Active Member of Clubs or Organizations: Yes  . Attends Archivist Meetings: More than 4 times per year  . Marital Status: Married  Human resources officer Violence: Not At Risk  . Fear of Current or Ex-Partner: No  . Emotionally Abused: No  . Physically Abused: No  . Sexually Abused: No     Physical Exam: BP 108/60   Pulse 78   Ht 5' 2"  (1.575 m)   Wt 181 lb (82.1 kg)   SpO2 97%   BMI 33.11 kg/m  Constitutional: generally well-appearing Psychiatric: alert and oriented x3 Abdomen: soft, nontender, nondistended, no obvious ascites, no peritoneal signs, normal bowel sounds Trace peripheral edema noted in lower extremities  Assessment and plan: 70 y.o. female with cirrhosis, fairly well compensated  She has noticed some swelling, edema in her ankles and recent ultrasound shows trace ascites.  I am going to start her on a low-dose single diuretic, Lasix 20 mg 1 pill once daily to see if this helps.  Because of the diuretic start she will need it basic metabolic profile in 2 weeks.  She knows to continue low-salt diet which she is usually very good about.  Her biggest complaint is fatigue.  She is still not taking iron on a daily basis.  I recommended that she really needs to start taking it every day, perhaps she should try taking it at bedtime.  I think that this will probably improve her blood counts and possibly her fatigue as well.  She will return to see me in 3 months, sooner if needed.  Please see the "Patient Instructions" section for addition details about the plan.  Owens Loffler, MD [facility] 04/21/2021, 9:11 AM   Total time on date of encounter was 30 minutes (this included time spent  preparing to see the patient reviewing records; obtaining and/or reviewing separately obtained history; performing a medically appropriate exam and/or evaluation; counseling and educating  the patient and family if present; ordering medications, tests or procedures if applicable; and documenting clinical information in the health record).

## 2021-04-28 ENCOUNTER — Ambulatory Visit (HOSPITAL_COMMUNITY): Payer: Medicare PPO

## 2021-04-29 ENCOUNTER — Encounter: Payer: Self-pay | Admitting: Internal Medicine

## 2021-05-05 ENCOUNTER — Ambulatory Visit: Payer: Medicare PPO | Admitting: Gastroenterology

## 2021-05-06 ENCOUNTER — Other Ambulatory Visit: Payer: Self-pay | Admitting: Family Medicine

## 2021-05-06 ENCOUNTER — Other Ambulatory Visit: Payer: Self-pay | Admitting: Internal Medicine

## 2021-05-06 ENCOUNTER — Telehealth (INDEPENDENT_AMBULATORY_CARE_PROVIDER_SITE_OTHER): Payer: Medicare PPO | Admitting: Family Medicine

## 2021-05-06 ENCOUNTER — Other Ambulatory Visit: Payer: Self-pay

## 2021-05-06 ENCOUNTER — Encounter: Payer: Self-pay | Admitting: Family Medicine

## 2021-05-06 DIAGNOSIS — J029 Acute pharyngitis, unspecified: Secondary | ICD-10-CM

## 2021-05-06 DIAGNOSIS — R0981 Nasal congestion: Secondary | ICD-10-CM

## 2021-05-06 DIAGNOSIS — J302 Other seasonal allergic rhinitis: Secondary | ICD-10-CM

## 2021-05-06 NOTE — Progress Notes (Signed)
Virtual Visit via Video Note  I connected with Dana Dyer  on 05/06/21 at 11:20 AM EDT by a video enabled telemedicine application and verified that I am speaking with the correct person using two identifiers.  Location patient: home, [place] Location provider:work or home office Persons participating in the virtual visit: patient, provider  I discussed the limitations of evaluation and management by telemedicine and the availability of in person appointments. The patient expressed understanding and agreed to proceed.   HPI:  Acute telemedicine visit for a sore throat: -Onset: started feeling poorly yesterday -Symptoms include: sore throat, low grade fever, chills, nasal congestion, cough, pnd -has some chronic sinus issues with allergies and she did not know what to take -feels a little better today -Denies: CP, SOB, NVD, inability to eat/drink/get out of bed -Pertinent past medical history: see below  -Pertinent medication allergies:  Allergies  Allergen Reactions  . Amoxicillin-Pot Clavulanate     Severe diarrhea  . Codeine Sulfate     REACTION: "hyper"  . Erythromycin Base   . Invokana [Canagliflozin] Other (See Comments)    Recurrent yeast infections  . Irbesartan-Hydrochlorothiazide   . Latex   . Victoza [Liraglutide] Nausea Only  . Ozempic (0.25 Or 0.5 Mg-Dose) [Semaglutide(0.25 Or 0.104m-Dos)] Nausea Only  -COVID-19 vaccine status: has had 4 doses of moderna vaccine  ROS: See pertinent positives and negatives per HPI.  Past Medical History:  Diagnosis Date  . ALLERGIC RHINITIS 10/09/2007  . Allergy   . Arthritis    hips, spine  . DIABETES MELLITUS, UNCONTROLLED 03/16/2009  . FATIGUE 10/21/2008  . FIBROMYALGIA 10/09/2007  . GERD (gastroesophageal reflux disease)    diet  controlled, no meds  . H/O vaginal hysterectomy   . HYPERLIPIDEMIA 10/09/2007   diet controlled, no meds  . HYPERTENSION 10/09/2007  . Morton's neuroma   . OBSTRUCTIVE SLEEP APNEA 10/09/2007  . Plantar  fasciitis    History - right  . Sleep apnea    Uses CPAP  . TRANSAMINASES, SERUM, ELEVATED 07/08/2009    Past Surgical History:  Procedure Laterality Date  . ABDOMINAL HYSTERECTOMY  1979   prolapsed uterus  . BASAL CELL CARCINOMA EXCISION    . BREAST BIOPSY     left 2012 stereo  . BREAST REDUCTION SURGERY Bilateral 1992  . COLONOSCOPY  2006   [facility]  . mamoplasty     reduction  . REDUCTION MAMMAPLASTY Bilateral   . TONSILLECTOMY  1959  . TUBAL LIGATION  1976     Current Outpatient Medications:  .  amLODipine (NORVASC) 5 MG tablet, TAKE 1 TABLET BY MOUTH EVERY DAY, Disp: 90 tablet, Rfl: 0 .  BD PEN NEEDLE NANO 2ND GEN 32G X 4 MM MISC, USE AS DIRECTED, Disp: 400 each, Rfl: 3 .  betamethasone dipropionate 0.05 % cream, , Disp: , Rfl:  .  Continuous Blood Gluc Sensor (DEXCOM G6 SENSOR) MISC, 1 Device by Does not apply route as directed., Disp: 3 each, Rfl: 11 .  Continuous Blood Gluc Transmit (DEXCOM G6 TRANSMITTER) MISC, 1 Device by Does not apply route as directed., Disp: 1 each, Rfl: 3 .  DULoxetine (CYMBALTA) 20 MG capsule, TAKE 1 CAPSULE BY MOUTH EVERY DAY, Disp: 90 capsule, Rfl: 0 .  furosemide (LASIX) 20 MG tablet, Take 1 tablet (20 mg total) by mouth daily., Disp: 30 tablet, Rfl: 11 .  glucose blood (ONETOUCH VERIO) test strip, USE TO CHECK BLOOD SUGAR 3 TIMES DAILY DX E11.9, Disp: 300 each, Rfl: 1 .  hydrochlorothiazide (HYDRODIURIL)  25 MG tablet, TAKE 1 TABLET BY MOUTH EVERY DAY, Disp: 90 tablet, Rfl: 0 .  insulin glargine (LANTUS SOLOSTAR) 100 UNIT/ML Solostar Pen, Inject 30 Units into the skin daily. (Patient taking differently: Inject 35 Units into the skin daily.), Disp: 45 mL, Rfl: 3 .  insulin lispro (HUMALOG KWIKPEN) 100 UNIT/ML KwikPen, Inject 0.1 mLs (10 Units total) into the skin daily with breakfast AND 0.15 mLs (15 Units total) daily with lunch AND 0.15 mLs (15 Units total) daily with supper., Disp: 45 mL, Rfl: 3 .  iron polysaccharides (NU-IRON) 150 MG capsule,  Take 1 capsule (150 mg total) by mouth daily., Disp: 30 capsule, Rfl: 11 .  Lancets (ONETOUCH DELICA PLUS [id number]) MISC, USE TO CHECK BLOOD GLUCOSE 3 TIMES A DAY, Disp: 300 each, Rfl: 1 .  losartan (COZAAR) 100 MG tablet, TAKE 1 TABLET BY MOUTH EVERY DAY, Disp: 90 tablet, Rfl: 1 .  metFORMIN (GLUCOPHAGE) 1000 MG tablet, TAKE 1 TABLET BY MOUTH TWICE A DAY, Disp: 180 tablet, Rfl: 3 .  MISC NATURAL PRODUCTS PO, Take by mouth. , Disp: , Rfl:  .  nadolol (CORGARD) 40 MG tablet, TAKE 1 TABLET BY MOUTH EVERY DAY, Disp: 90 tablet, Rfl: 2 .  ondansetron (ZOFRAN) 4 MG tablet, Take 1 tablet (4 mg total) by mouth 2 (two) times daily as needed for nausea., Disp: 50 tablet, Rfl: 3 .  rosuvastatin (CRESTOR) 10 MG tablet, TAKE 1 TABLET BY MOUTH EVERY DAY, Disp: 90 tablet, Rfl: 1  Current Facility-Administered Medications:  .  TDaP (BOOSTRIX) injection 0.5 mL, 0.5 mL, Intramuscular, Once, Burchette, Alinda Sierras, MD  EXAM:  VITALS per patient if applicable:  GENERAL: alert, oriented, appears well and in no acute distress  HEENT: atraumatic, conjunttiva clear, no obvious abnormalities on inspection of external nose and ears  NECK: normal movements of the head and neck  LUNGS: on inspection no signs of respiratory distress, breathing rate appears normal, no obvious gross SOB, gasping or wheezing  CV: no obvious cyanosis  MS: moves all visible extremities without noticeable abnormality  PSYCH/NEURO: pleasant and cooperative, no obvious depression or anxiety, speech and thought processing grossly intact  ASSESSMENT AND PLAN:  Discussed the following assessment and plan:  Nasal congestion  Sore throat  Seasonal allergies  -we discussed possible serious and likely etiologies, options for evaluation and workup, limitations of telemedicine visit vs in person visit, treatment, treatment risks and precautions. Pt prefers to treat via telemedicine empirically rather than in person at this moment. Query  VRUI, covid19 vs other. She plans to do a PCR test on day 3. Symptomatic care with small doses of nsaid if needed, salt water gargles, starting allegra for her allergies. She agrees to follow up promptly with PCP office if positive covid test via video visit to discuss treatment.  Work/School/Travel slipped offered: provided in patient instructions  Scheduled follow up with PCP offered: she agrees to follow up as needed.  Advised to seek prompt in person care if worsening, new symptoms arise, or if is not improving with treatment. Discussed options for inperson care if PCP office not available. Did let this patient know that I only do telemedicine on Tuesdays and Thursdays for [place]. Advised to schedule follow up visit with PCP or [facility] if any further questions or concerns to avoid delays in care.   I discussed the assessment and treatment plan with the patient. The patient was provided an opportunity to ask questions and all were answered. The patient agreed with the plan  and demonstrated an understanding of the instructions.     Lucretia Kern, DO

## 2021-05-06 NOTE — Patient Instructions (Addendum)
   ---------------------------------------------------------------------------------------------------------------------------      TRAVEL/WORK SLIP:  Patient Dana Dyer,  January 20, 1951, was seen for a medical visit today, 05/06/21 . Please excuse from work for a COVID like illness. We advised repeat covid testing on day 3 of symptoms and if positive we advise 10 days of isolation minimum from the onset of symptoms (05/05/21) PLUS 1 day of no fever and improved symptoms. Would advise against travel at this time given possible covid illness.   Sincerely: E-signature: Dr. Colin Benton, DO [place] Ph: [phone]   ------------------------------------------------------------------------------------------------------------------------------   HOME CARE TIPS:  -[place] testing information: [email or web address]/ OR [phone] Most pharmacies also offer testing and home test kits. If the Covid19 test is positive, please make a prompt follow up visit with your primary care office or with [facility] to discuss treatment options. Treatments for Covid19 are best given early in the course of the illness.   -can use small amounts of ibuprofen if needed for fevers, aches and pains per instructions  -can use nasal saline a few times per day if you have nasal congestion; sometimes  a short course of Afrin nasal spray for 3 days can help with symptoms as well  -stay hydrated, drink plenty of fluids and eat small healthy meals - avoid dairy  -If the Covid test is positive, check out the [facility] website for more information on home care, transmission and treatment for COVID19  -follow up with your doctor in 2-3 days unless improving and feeling better  -stay home while sick, except to seek medical care. If you have COVID19, ideally it would be best to stay home for a full 10 days since the onset of symptoms PLUS one day of no  fever and feeling better. Wear a good mask that fits snugly (such as N95 or KN95) if around others to reduce the risk of transmission.  It was nice to meet you today, and I really hope you are feeling better soon. I help [facility] out with telemedicine visits on Tuesdays and Thursdays and am available for visits on those days. If you have any concerns or questions following this visit please schedule a follow up visit with your Primary Care doctor or seek care at a local urgent care clinic to avoid delays in care.    Seek in person care or schedule a follow up video visit promptly if your symptoms worsen, new concerns arise or you are not improving with treatment. Call 911 and/or seek emergency care if your symptoms are severe or life threatening.

## 2021-05-07 ENCOUNTER — Other Ambulatory Visit: Payer: Self-pay | Admitting: Internal Medicine

## 2021-05-11 ENCOUNTER — Other Ambulatory Visit (INDEPENDENT_AMBULATORY_CARE_PROVIDER_SITE_OTHER): Payer: Medicare PPO

## 2021-05-11 DIAGNOSIS — Z03818 Encounter for observation for suspected exposure to other biological agents ruled out: Secondary | ICD-10-CM | POA: Diagnosis not present

## 2021-05-11 DIAGNOSIS — K7460 Unspecified cirrhosis of liver: Secondary | ICD-10-CM | POA: Diagnosis not present

## 2021-05-11 DIAGNOSIS — J00 Acute nasopharyngitis [common cold]: Secondary | ICD-10-CM | POA: Diagnosis not present

## 2021-05-11 DIAGNOSIS — Z20822 Contact with and (suspected) exposure to covid-19: Secondary | ICD-10-CM | POA: Diagnosis not present

## 2021-05-11 LAB — BASIC METABOLIC PANEL
BUN: 10 mg/dL (ref 6–23)
CO2: 30 mEq/L (ref 19–32)
Calcium: 9.7 mg/dL (ref 8.4–10.5)
Chloride: 90 mEq/L — ABNORMAL LOW (ref 96–112)
Creatinine, Ser: 0.65 mg/dL (ref 0.40–1.20)
GFR: 88.39 mL/min (ref >60.00)
Glucose, Bld: 303 mg/dL — ABNORMAL HIGH (ref 70–99)
Potassium: 3.3 mEq/L — ABNORMAL LOW (ref 3.5–5.1)
Sodium: 130 mEq/L — ABNORMAL LOW (ref 135–145)

## 2021-05-14 ENCOUNTER — Ambulatory Visit: Payer: Medicare PPO | Admitting: Dietician

## 2021-05-14 ENCOUNTER — Other Ambulatory Visit: Payer: Self-pay

## 2021-05-14 DIAGNOSIS — K7460 Unspecified cirrhosis of liver: Secondary | ICD-10-CM

## 2021-05-14 DIAGNOSIS — E876 Hypokalemia: Secondary | ICD-10-CM

## 2021-05-14 MED ORDER — POTASSIUM CHLORIDE CRYS ER 20 MEQ PO TBCR: 20.0000 meq | EXTENDED_RELEASE_TABLET | Freq: Every day | ORAL | 3 refills | Status: DC

## 2021-05-25 DIAGNOSIS — K219 Gastro-esophageal reflux disease without esophagitis: Secondary | ICD-10-CM | POA: Diagnosis not present

## 2021-05-25 DIAGNOSIS — J328 Other chronic sinusitis: Secondary | ICD-10-CM | POA: Diagnosis not present

## 2021-06-01 ENCOUNTER — Other Ambulatory Visit: Payer: Self-pay

## 2021-06-01 ENCOUNTER — Other Ambulatory Visit: Payer: Self-pay | Admitting: Family Medicine

## 2021-06-01 MED ORDER — DULOXETINE HCL 20 MG PO CPEP
ORAL_CAPSULE | ORAL | 0 refills | Status: DC
Start: 2021-06-01 — End: 2021-07-26

## 2021-06-02 ENCOUNTER — Encounter: Payer: Self-pay | Admitting: Internal Medicine

## 2021-06-02 ENCOUNTER — Ambulatory Visit: Payer: Medicare PPO | Admitting: Internal Medicine

## 2021-06-02 ENCOUNTER — Other Ambulatory Visit: Payer: Self-pay

## 2021-06-02 VITALS — BP 110/60 | HR 86 | Ht 62.0 in | Wt 175.0 lb

## 2021-06-02 DIAGNOSIS — E1165 Type 2 diabetes mellitus with hyperglycemia: Secondary | ICD-10-CM | POA: Diagnosis not present

## 2021-06-02 DIAGNOSIS — Z794 Long term (current) use of insulin: Secondary | ICD-10-CM

## 2021-06-02 DIAGNOSIS — E785 Hyperlipidemia, unspecified: Secondary | ICD-10-CM | POA: Diagnosis not present

## 2021-06-02 LAB — POCT GLYCOSYLATED HEMOGLOBIN (HGB A1C): Hemoglobin A1C: 10.3 % — AB (ref 4.0–5.6)

## 2021-06-02 NOTE — Progress Notes (Signed)
Name: Dana Dyer  Age/ Sex: 70 y.o., female   MRN/ DOB: [id number], February 19, 1951     PCP: Eulas Post, MD   Reason for Endocrinology Evaluation: Type 2 Diabetes Mellitus  Initial Endocrine Consultative Visit: 10/23/18    PATIENT IDENTIFIER: Dana Dyer is a 70 y.o. female with a past medical history of HTN, T2DM, Cirrhosis, Fibromyalgia and dyslipidemia . The patient has followed with Endocrinology clinic since 10/23/18 for consultative assistance with management of her diabetes.  DIABETIC HISTORY:  Dana Dyer was diagnosed with T2DM in 2005. She used to be on SU but does not recall intolerance. She is intolerant to victoza - nauea and invokana- yeast infection. She was started on insulin therapy in 2009. Her hemoglobin A1c has ranged from 7.8 % in 2012, peaking at 10.9% in 2018.   Ozempic was stopped in 06/2019 due to persistent nausea.   Switched pravastatin to crestor 09/2019  SUBJECTIVE:   During the last visit (03/01/2021): A1c was 6.7 %. We continued metformin and adjusted MDI regimen   Today (06/02/2021): Dana Dyer is here for a follow up on diabetes management.  She checks her blood sugars 2 times daily. The patient has not had hypoglycemic episodes since the last clinic visit per    LE edema has improved since adjusting diuretic through GI for cirrhosis  She had an appointment with Dana Dyer 6/10 for dexcom training but pt cancelled     [place]:  Lantus 34 units daily Humalog 10 units with Breakfast  Humalog 18 units with lunch and supper Metformin 1000 mg BID  Crestor 10 mg daily      METER DOWNLOAD SUMMARY: 9/30-10/13/2021 Average Number Tests=  12 Overall Mean FS Glucose = 149 SD= 23   BG Ranges: Low = 102 High = 186   Hypoglycemic Events/30 Days: BG < 50 = 0 Episodes of symptomatic severe hypoglycemia =0   DIABETIC COMPLICATIONS: Microvascular complications:  Neuroathy Denies: retinopathy, nephropathy Last  eye exam: Completed 2021     Macrovascular complications:   Denies: CAD, PVD, CVA    HISTORY:  Past Medical History:  Past Medical History:  Diagnosis Date   ALLERGIC RHINITIS 10/09/2007   Allergy    Arthritis    hips, spine   DIABETES MELLITUS, UNCONTROLLED 03/16/2009   FATIGUE 10/21/2008   FIBROMYALGIA 10/09/2007   GERD (gastroesophageal reflux disease)    diet  controlled, no meds   H/O vaginal hysterectomy    HYPERLIPIDEMIA 10/09/2007   diet controlled, no meds   HYPERTENSION 10/09/2007   Morton's neuroma    OBSTRUCTIVE SLEEP APNEA 10/09/2007   Plantar fasciitis    History - right   Sleep apnea    Uses CPAP   TRANSAMINASES, SERUM, ELEVATED 07/08/2009   Past Surgical History:  Past Surgical History:  Procedure Laterality Date   ABDOMINAL HYSTERECTOMY  1979   prolapsed uterus   BASAL CELL CARCINOMA EXCISION     BREAST BIOPSY     left 2012 stereo   BREAST REDUCTION SURGERY Bilateral 1992   COLONOSCOPY  2006   [facility]   mamoplasty     reduction   REDUCTION MAMMAPLASTY Bilateral    TONSILLECTOMY  1959   TUBAL LIGATION  1976   Social History:  reports that she has never smoked. She has never used smokeless tobacco. She reports that she does not drink alcohol and does not use drugs. Family History:  Family History  Problem Relation Age of Onset   Diabetes Mother  Arthritis Mother    Cancer Mother        breast   Breast cancer Mother 80   Alcohol abuse Father    Arthritis Father    Hypertension Maternal Grandfather    Miscarriages / Stillbirths Paternal Grandfather    Breast cancer Maternal Aunt        diagnosed in her 11's   Colon polyps Neg Hx    Colon cancer Neg Hx    Esophageal cancer Neg Hx    Rectal cancer Neg Hx    Stomach cancer Neg Hx      HOME MEDICATIONS: Allergies as of 06/02/2021       Reactions   Amoxicillin-pot Clavulanate    Severe diarrhea   Codeine Sulfate    REACTION: "hyper"   Erythromycin Base    Invokana [canagliflozin]  Other (See Comments)   Recurrent yeast infections   Irbesartan-hydrochlorothiazide    Latex    Victoza [liraglutide] Nausea Only   Ozempic (0.25 Or 0.5 Mg-dose) [semaglutide(0.25 Or 0.53m-dos)] Nausea Only        Medication List        Accurate as of June 02, 2021  9:43 AM. If you have any questions, ask your nurse or doctor.          amLODipine 5 MG tablet Commonly known as: NORVASC TAKE 1 TABLET BY MOUTH EVERY DAY   BD Pen Needle Nano 2nd Gen 32G X 4 MM Misc Generic drug: Insulin Pen Needle USE AS DIRECTED   betamethasone dipropionate 0.05 % cream   Dexcom G6 Sensor Misc 1 Device by Does not apply route as directed.   Dexcom G6 Transmitter Misc 1 Device by Does not apply route as directed.   DULoxetine 20 MG capsule Commonly known as: CYMBALTA TAKE 1 CAPSULE BY MOUTH EVERY DAY   furosemide 20 MG tablet Commonly known as: Lasix Take 1 tablet (20 mg total) by mouth daily.   hydrochlorothiazide 25 MG tablet Commonly known as: HYDRODIURIL TAKE 1 TABLET BY MOUTH EVERY DAY   insulin lispro 100 UNIT/ML KwikPen Commonly known as: HumaLOG KwikPen Inject 0.1 mLs (10 Units total) into the skin daily with breakfast AND 0.15 mLs (15 Units total) daily with lunch AND 0.15 mLs (15 Units total) daily with supper.   iron polysaccharides 150 MG capsule Commonly known as: Nu-Iron Take 1 capsule (150 mg total) by mouth daily.   Lantus SoloStar 100 UNIT/ML Solostar Pen Generic drug: insulin glargine INJECT 30 UNITS INTO THE SKIN DAILY.   losartan 100 MG tablet Commonly known as: COZAAR TAKE 1 TABLET BY MOUTH EVERY DAY   metFORMIN 1000 MG tablet Commonly known as: GLUCOPHAGE TAKE 1 TABLET BY MOUTH TWICE A DAY   MISC NATURAL PRODUCTS PO Take by mouth.   nadolol 40 MG tablet Commonly known as: CORGARD TAKE 1 TABLET BY MOUTH EVERY DAY   ondansetron 4 MG tablet Commonly known as: Zofran Take 1 tablet (4 mg total) by mouth 2 (two) times daily as needed for  nausea.   OneTouch Delica Plus [id number] USE TO CHECK BLOOD GLUCOSE 3 TIMES A DAY   OneTouch Verio test strip Generic drug: glucose blood USE TO CHECK BLOOD SUGAR 3 TIMES DAILY DX E11.9   potassium chloride SA 20 MEQ tablet Commonly known as: KLOR-CON Take 1 tablet (20 mEq total) by mouth daily.   rosuvastatin 10 MG tablet Commonly known as: CRESTOR TAKE 1 TABLET BY MOUTH EVERY DAY         OBJECTIVE:  Vital Signs: BP 110/60   Pulse 86   Ht 5' 2"  (1.575 m)   Wt 175 lb (79.4 kg)   SpO2 97%   BMI 32.01 kg/m   Wt Readings from Last 3 Encounters:  06/02/21 175 lb (79.4 kg)  04/21/21 181 lb (82.1 kg)  03/01/21 184 lb 8 oz (83.7 kg)     Exam: General: Pt appears well and is in NAD  Lungs: Clear with good BS bilat with no rales, rhonchi, or wheezes  Heart: RRR   Abdomen: Normoactive bowel sounds, soft, nontender, without masses or organomegaly palpable  Extremities: Trace  pretibial edema.  Neuro: MS is good with appropriate affect, pt is alert and Ox3   DM Foot 03/01/2021 The skin of the feet is intact without sores or ulcerations. The pedal pulses are 2+ on right and 2+ on left. The sensation is decreased  to a screening 5.07, 10 gram monofilament on the left      DATA REVIEWED:  Lab Results  Component Value Date   HGBA1C 8.5 (A) 03/01/2021   HGBA1C 8.2 (H) 10/23/2020   HGBA1C 8.4 (A) 09/16/2020   Lab Results  Component Value Date   MICROALBUR <0.7 09/09/2019   LDLCALC 126 (H) 08/27/2019   CREATININE 0.65 05/11/2021   Results for ADONNA, HORSLEY (MRN [id number]) as of 03/01/2021 08:58  Ref. Range 08/31/2020 10:24  Sodium Latest Ref Range: 135 - 145 mEq/L 139  Potassium Latest Ref Range: 3.5 - 5.1 mEq/L 3.7  Chloride Latest Ref Range: 96 - 112 mEq/L 97  CO2 Latest Ref Range: 19 - 32 mEq/L 33 (H)  Glucose Latest Ref Range: 70 - 99 mg/dL 186 (H)  BUN Latest Ref Range: 6 - 23 mg/dL 11  Creatinine Latest Ref Range: 0.40 - 1.20 mg/dL 0.58  Calcium  Latest Ref Range: 8.4 - 10.5 mg/dL 9.4  Alkaline Phosphatase Latest Ref Range: 39 - 117 U/L 81  Albumin Latest Ref Range: 3.5 - 5.2 g/dL 3.6  AST Latest Ref Range: 0 - 37 U/L 36  ALT Latest Ref Range: 0 - 35 U/L 33  Total Protein Latest Ref Range: 6.0 - 8.3 g/dL 7.1  Total Bilirubin Latest Ref Range: 0.2 - 1.2 mg/dL 1.2  GFR Latest Ref Range: >60.00 mL/min 102.47   In Office Bg 164 mg/dL    ASSESSMENT / PLAN / RECOMMENDATIONS:   1) Type 2 Diabetes Mellitus, Poorly controlled, With neuropathic complications - Most recent A1c of 10.3%. Goal A1c < 7.0 %.    -Patient with hyperglycemia, A1c increased from 8.5% to 10.3% -Patient admits to hyperglycemia, and is not surprised by this elevation in A1c, I was not able to figure out what is the cause of this drastic increase, as she does not believe she is has made drastic changes in her diet nor she believes that she has missed a lot of insulin injections. -Unfortunately without glucose data I am unable to make drastic changes, I do suspect imperfect adherence with insulin intake -She does have the Dexcom with her she was not able to make it on June 10th to our RD for training, and her appointment has been pushed further out - She is intolerant to GLP-1 agonists and SGLT-2 inhibitors.  - No changes today due to lack of sufficient glucose data  -Patient was advised to check glucose before meals for the next 2 weeks and bring that data to the office or send me a portal message with that information so I can make the  proper adjustments    MEDICATIONS: - Continue Lantus 34 units daily - Continue Humalog 10 units with breakfast, 18 units with Lunch and Supper  -Continue metformin 1000 mg BID  - CF : HUmalog ( BG -140/25)    EDUCATION / INSTRUCTIONS: BG monitoring instructions: Patient is instructed to check her blood sugars 4 times a day, before meals and bedtime. Call [facility] if: BG persistently < 70  I reviewed the Rule  of 15 for the treatment of hypoglycemia in detail with the patient. Literature supplied.   2) Dyslipidemia:   -Historically she has stated that she is unable to tolerate Crestor more than 3 times a week, but today she assures me she has been taking it daily without any side effects.   Medication Continue Crestor 10 mg daily      F/U in 4 months    Signed electronically by: Mack Guise, MD  [facility]  [facility] [address], [place] [address] Phone: [phone] FAX: [phone]   CC: [address], [place] [address] Phone: [phone]  Fax: [phone]  Return to Endocrinology clinic as below: Future Appointments  Date Time Provider [place]  07/12/2021  2:00 PM Valora Piccolo Barnabas Lister, RD [place] NDM

## 2021-06-02 NOTE — Patient Instructions (Signed)
-   Continue Lantus 34 units daily  - Continue Humalog 10 units with Breakfast and 18  units  Lunch and supper - Continue Metformin 1000 mg, 1 tablet twice daily   - Humalog correctional insulin: ADD extra units on insulin to your meal-time Humalog  dose if your blood sugars are higher than 165. Use the scale below to help guide you:   Blood sugar before meal Number of units to inject  Less than 165 0 unit  166 -  190 1 units  191 -  215 2 units  216 -  240 3 units  241 -  265 4 units  266 -  290 5 units  291 -  315 6 units  316 -  340 7 units  341 -  365 8 units  365 - 390 9 units   391- 415 10 units  416- 440 11 units  441- 465 12 units       HOW TO TREAT LOW BLOOD SUGARS (Blood sugar LESS THAN 70 MG/DL) Please follow the RULE OF 15 for the treatment of hypoglycemia treatment (when your (blood sugars are less than 70 mg/dL)   STEP 1: Take 15 grams of carbohydrates when your blood sugar is low, which includes:  3-4 GLUCOSE TABS  OR 3-4 OZ OF JUICE OR REGULAR SODA OR ONE TUBE OF GLUCOSE GEL    STEP 2: RECHECK blood sugar in 15 MINUTES STEP 3: If your blood sugar is still low at the 15 minute recheck --> then, go back to STEP 1 and treat AGAIN with another 15 grams of carbohydrates.

## 2021-06-05 ENCOUNTER — Other Ambulatory Visit: Payer: Self-pay | Admitting: Gastroenterology

## 2021-06-09 ENCOUNTER — Other Ambulatory Visit: Payer: Self-pay

## 2021-06-09 ENCOUNTER — Telehealth: Payer: Medicare PPO | Admitting: Family

## 2021-06-09 ENCOUNTER — Telehealth (INDEPENDENT_AMBULATORY_CARE_PROVIDER_SITE_OTHER): Payer: Medicare PPO | Admitting: Family Medicine

## 2021-06-09 DIAGNOSIS — B373 Candidiasis of vulva and vagina: Secondary | ICD-10-CM | POA: Diagnosis not present

## 2021-06-09 DIAGNOSIS — B3731 Acute candidiasis of vulva and vagina: Secondary | ICD-10-CM

## 2021-06-09 MED ORDER — FLUCONAZOLE 100 MG PO TABS: 100.0000 mg | ORAL_TABLET | Freq: Every day | ORAL | 0 refills | Status: DC

## 2021-06-09 NOTE — Progress Notes (Signed)
Patient ID: Dana Dyer, female   DOB: 05/29/1951, 70 y.o.   MRN: [id number]  This visit type was conducted due to national recommendations for restrictions regarding the COVID-19 pandemic in an effort to limit this patient's exposure and mitigate transmission in our community.   Virtual Visit via Telephone Note  I connected with Dana Dyer on 06/09/21 at  5:15 PM EDT by telephone and verified that I am speaking with the correct person using two identifiers.   I discussed the limitations, risks, security and privacy concerns of performing an evaluation and management service by telephone and the availability of in person appointments. I also discussed with the patient that there may be a patient responsible charge related to this service. The patient expressed understanding and agreed to proceed.  Location patient: home Location provider: work or home office Participants present for the call: patient, provider Patient did not have a visit in the prior 7 days to address this/these issue(s).   History of Present Illness:  Dana Dyer relates 2-day history of some vaginal itching and diffuse burning sensation and some whitish discharge typical of previous yeast vaginitis infections she has had frequently in the past.  She has type 2 diabetes and cannot tolerate SGLT2 medications.  Denies any actual burning with urination.  No fevers or chills.  Has tried over-the-counter topicals in the past without much success.  She has done well with regimen of fluconazole 100 mg daily for 7 days and with shorter regimen has not always cleared.  She is requesting refill.  Diabetes is poorly controlled with recent A1c 10.3%.  Followed by endocrinology.  No recent antibiotic use.  Past Medical History:  Diagnosis Date   ALLERGIC RHINITIS 10/09/2007   Allergy    Arthritis    hips, spine   DIABETES MELLITUS, UNCONTROLLED 03/16/2009   FATIGUE 10/21/2008   FIBROMYALGIA 10/09/2007   GERD (gastroesophageal  reflux disease)    diet  controlled, no meds   H/O vaginal hysterectomy    HYPERLIPIDEMIA 10/09/2007   diet controlled, no meds   HYPERTENSION 10/09/2007   Morton's neuroma    OBSTRUCTIVE SLEEP APNEA 10/09/2007   Plantar fasciitis    History - right   Sleep apnea    Uses CPAP   TRANSAMINASES, SERUM, ELEVATED 07/08/2009   Past Surgical History:  Procedure Laterality Date   ABDOMINAL HYSTERECTOMY  1979   prolapsed uterus   BASAL CELL CARCINOMA EXCISION     BREAST BIOPSY     left 2012 stereo   BREAST REDUCTION SURGERY Bilateral 1992   COLONOSCOPY  2006   [place]   mamoplasty     reduction   REDUCTION MAMMAPLASTY Bilateral    [name]    reports that she has never smoked. She has never used smokeless tobacco. She reports that she does not drink alcohol and does not use drugs. family history includes Alcohol abuse in her father; Arthritis in her father and mother; Breast cancer in her maternal aunt; Breast cancer (age of onset: 56) in her mother; Cancer in her mother; Diabetes in her mother; Hypertension in her maternal grandfather; Miscarriages / [place] in her paternal grandfather. Allergies  Allergen Reactions   Amoxicillin-Pot Clavulanate     Severe diarrhea   Codeine Sulfate     REACTION: "hyper"   Erythromycin Base    Invokana [Canagliflozin] Other (See Comments)    Recurrent yeast infections   Irbesartan-Hydrochlorothiazide    Latex    Victoza [  Liraglutide] Nausea Only   Ozempic (0.25 Or 0.5 Mg-Dose) [Semaglutide(0.25 Or 0.30m-Dos)] Nausea Only      Observations/Objective: Patient sounds cheerful and well on the phone. I do not appreciate any SOB. Speech and thought processing are grossly intact. Patient reported vitals:  Assessment and Plan:  Probable yeast vaginitis.  Patient has had identical symptoms in the past with yeast infections.  She has type 2 diabetes as a major risk factor  -Agree to fluconazole 100 mg daily for  7 days -Recommend in office follow-up to further assess if not clearing with the above  Follow Up Instruction  As needed for any persistent symptoms   99441 5-10 [id number] 11-20 99443 21-30 I did not refer this patient for an OV in the next 24 hours for this/these issue(s).  I discussed the assessment and treatment plan with the patient. The patient was provided an opportunity to ask questions and all were answered. The patient agreed with the plan and demonstrated an understanding of the instructions.   The patient was advised to call back or seek an in-person evaluation if the symptoms worsen or if the condition fails to improve as anticipated.  I provided 12 minutes of non-face-to-face time during this encounter.   BCarolann Littler MD

## 2021-06-21 ENCOUNTER — Other Ambulatory Visit: Payer: Self-pay | Admitting: Internal Medicine

## 2021-07-01 ENCOUNTER — Encounter: Payer: Self-pay | Admitting: Family Medicine

## 2021-07-01 DIAGNOSIS — D485 Neoplasm of uncertain behavior of skin: Secondary | ICD-10-CM | POA: Diagnosis not present

## 2021-07-01 DIAGNOSIS — L309 Dermatitis, unspecified: Secondary | ICD-10-CM | POA: Diagnosis not present

## 2021-07-01 DIAGNOSIS — L821 Other seborrheic keratosis: Secondary | ICD-10-CM | POA: Diagnosis not present

## 2021-07-01 DIAGNOSIS — L820 Inflamed seborrheic keratosis: Secondary | ICD-10-CM | POA: Diagnosis not present

## 2021-07-01 DIAGNOSIS — C44529 Squamous cell carcinoma of skin of other part of trunk: Secondary | ICD-10-CM | POA: Diagnosis not present

## 2021-07-01 DIAGNOSIS — Z85828 Personal history of other malignant neoplasm of skin: Secondary | ICD-10-CM | POA: Diagnosis not present

## 2021-07-12 ENCOUNTER — Other Ambulatory Visit: Payer: Self-pay

## 2021-07-12 ENCOUNTER — Encounter: Payer: Medicare PPO | Attending: Internal Medicine | Admitting: Dietician

## 2021-07-12 DIAGNOSIS — E11649 Type 2 diabetes mellitus with hypoglycemia without coma: Secondary | ICD-10-CM | POA: Diagnosis not present

## 2021-07-12 NOTE — Progress Notes (Signed)
Diabetes Self-Management Education  Visit Type: First/Initial  Appt. Start Time: 1435 Appt. End Time: 1275 (Patient arrived late as she came to the wrong office.) 07/13/2021  [name], identified by name and date of birth, is a 70 y.o. female with a diagnosis of Diabetes: Type 2.   ASSESSMENT Patient is here today alone. She has brought her Dexcom with her to be trained. She was referred for type 2 Diabetes.  History includes Type 2 Diabetes, HTN, Cirrhosis, Fibromyalgia, and dyslipidemia.  She also reports problems with constipation. A1C 10.3% 06/02/2021 increased from 8.5% 03/01/2021 Medications Include:  Lantus 35 units q HS, Humalog 10 units before breakfast and 18-20 units before lunch and dinner plus a correction factor of 1 unit for every 25 points above 140, Metformin She forgets the meal time insulin at times. Weight 175 lbs 06/02/2021  Dexcom G6 Personal CGM Training -Getting to know device    (Phone programmed ) -Setting up device (high alert  250  , low alert 70 ) -Inserting sensor (left mid abdomen WNL) -Calibrating- none required for G6 -Ending sensor session -Trouble shooting -Tape guide, clarity information  -Reviewed insulin dosing from dexcom.   Patient has [facility] tech support and my contact information.  Patient lives with her husband.  She does the shopping and cooking.   She is retired from [employer], production). Had a back injury and gave up golf for 2 years and is slowly getting back.  Weight 177 lb (80.3 kg). Body mass index is 32.37 kg/m.   Diabetes Self-Management Education - 07/12/21 1457       Visit Information   Visit Type First/Initial      Initial Visit   Diabetes Type Type 2    Are you currently following a meal plan? No    Are you taking your medications as prescribed? Yes      Psychosocial Assessment   Patient Belief/Attitude about Diabetes Motivated to manage diabetes    Self-care barriers None     Self-management support Doctor's office    Other persons present Patient    Patient Concerns Nutrition/Meal planning;Glycemic Control;Monitoring    Special Needs None    Preferred Learning Style No preference indicated    Learning Readiness Ready    How often do you need to have someone help you when you read instructions, pamphlets, or other written materials from your doctor or pharmacy? 1 - Never    What is the last grade level you completed in school? advanced college      Pre-Education Assessment   Patient understands the diabetes disease and treatment process. Needs Review    Patient understands incorporating nutritional management into lifestyle. Needs Review    Patient undertands incorporating physical activity into lifestyle. Needs Review    Patient understands using medications safely. Needs Review    Patient understands monitoring blood glucose, interpreting and using results Needs Review    Patient understands prevention, detection, and treatment of acute complications. Needs Review    Patient understands prevention, detection, and treatment of chronic complications. Needs Review    Patient understands how to develop strategies to address psychosocial issues. Needs Review    Patient understands how to develop strategies to promote health/change behavior. Needs Review      Complications   Last HgB A1C per patient/outside source 10.3 %   06/02/2021 increased from 8.5% 03/01/2021   How often do you check your blood sugar? 1-2 times/day    Fasting Blood glucose range (mg/dL)  70-129    Postprandial Blood glucose range (mg/dL) >200      Dietary Intake   Breakfast raising bran, plain cheerios, 2%A2 milk    Lunch 1/2 sandwich (chicken salad,1 slice bread) OR 1/2 salad AND fruit    Dinner cereal OR rice-a-roni with hamburger AND fruit   problems with nausea frequently at dinner   Snack (evening) tries to avoid, occasional cheese    Beverage(s) water ("not enough"), decreasing diet  coke, sugar free gatorade      Exercise   Exercise Type ADL's      Patient Education   Previous Diabetes Education Yes (please comment)   RD 2018   Nutrition management  Other (comment)   mindfulness, agreed no or limitted snacking   Medications Reviewed patients medication for diabetes, action, purpose, timing of dose and side effects.    Monitoring Identified appropriate SMBG and/or A1C goals.   Dexcom training   Psychosocial adjustment Worked with patient to identify barriers to care and solutions      Individualized Goals (developed by patient)   Nutrition General guidelines for healthy choices and portions discussed    Medications take my medication as prescribed    Monitoring  test my blood glucose as discussed    Reducing Risk examine blood glucose patterns      Post-Education Assessment   Patient understands the diabetes disease and treatment process. Demonstrates understanding / competency    Patient understands incorporating nutritional management into lifestyle. Needs Review    Patient undertands incorporating physical activity into lifestyle. Needs Review    Patient understands using medications safely. Needs Review    Patient understands monitoring blood glucose, interpreting and using results Needs Review    Patient understands prevention, detection, and treatment of acute complications. Demonstrates understanding / competency    Patient understands prevention, detection, and treatment of chronic complications. Demonstrates understanding / competency    Patient understands how to develop strategies to address psychosocial issues. Demonstrates understanding / competency    Patient understands how to develop strategies to promote health/change behavior. Needs Review      Outcomes   Expected Outcomes Demonstrated interest in learning. Expect positive outcomes    Future DMSE 4-6 wks    Program Status Not Completed             Individualized Plan for Diabetes  Self-Management Training:   Learning Objective:  Patient will have a greater understanding of diabetes self-management. Patient education plan is to attend individual and/or group sessions per assessed needs and concerns.   Plan:   Patient Instructions  Download the Dexcom Clarity app and enter the share code that was sent to you today via email.  Blood glucose goals: 80-130 fasting Less than 180 2 hours after you eat a meal  Remember to take your insulin.  Take the meal time insulin (humalog) before you eat  Take the lantus consistently each evening at approximately the same time.  Mindful meal habits      Expected Outcomes:  Demonstrated interest in learning. Expect positive outcomes  Education material provided:   If problems or questions, patient to contact team via:  Phone  Future DSME appointment: 4-6 wks

## 2021-07-12 NOTE — Patient Instructions (Addendum)
Download the [employer] app and enter the share code that was sent to you today via email.  Blood glucose goals: 80-130 fasting Less than 180 2 hours after you eat a meal  Remember to take your insulin.  Take the meal time insulin (humalog) before you eat  Take the lantus consistently each evening at approximately the same time.  Mindful meal habits

## 2021-07-13 ENCOUNTER — Ambulatory Visit: Payer: Medicare PPO | Admitting: Dietician

## 2021-07-13 DIAGNOSIS — E11649 Type 2 diabetes mellitus with hypoglycemia without coma: Secondary | ICD-10-CM

## 2021-07-13 NOTE — Progress Notes (Signed)
Patient is here today alone. Dexcom placed yesterday but the transmitter never paired.  The transmitter did not work despite her call to [place].  Provided patient with a new transmitter. Patient was able to remove the old sensor and transmitter.  She saved the transmitter to return to [facility] for a replacement.  Patient applied a new sensor to her left upper abdomen and transmitter was able to pair.  Questions answered.  Patient is to call for further concerns.  Will see for follow up in about 1 month.  Antonieta Iba, RD, LDN, CDCES

## 2021-07-14 ENCOUNTER — Other Ambulatory Visit: Payer: Self-pay | Admitting: Obstetrics and Gynecology

## 2021-07-14 ENCOUNTER — Other Ambulatory Visit: Payer: Self-pay | Admitting: Family Medicine

## 2021-07-14 DIAGNOSIS — Z1231 Encounter for screening mammogram for malignant neoplasm of breast: Secondary | ICD-10-CM

## 2021-07-22 ENCOUNTER — Telehealth: Payer: Self-pay | Admitting: Dietician

## 2021-07-22 ENCOUNTER — Telehealth: Payer: Self-pay | Admitting: Family Medicine

## 2021-07-22 NOTE — Telephone Encounter (Signed)
Left message for patient to call back and schedule Medicare Annual Wellness Visit (AWV) either virtually or in office. Left  my Herbie Drape number [phone]   Last AWV 09/01/20 please schedule at anytime with [facility] or 2   This should be a 45 minute visit.

## 2021-07-22 NOTE — Telephone Encounter (Signed)
Patient called stating that she is having problems restarting her Dexcom.  Appointment made for 4:00 tomorrow to help her further with this problem.  Antonieta Iba, RD, LDN, CDCES

## 2021-07-23 ENCOUNTER — Other Ambulatory Visit: Payer: Self-pay | Admitting: Gastroenterology

## 2021-07-23 ENCOUNTER — Other Ambulatory Visit: Payer: Self-pay | Admitting: Family Medicine

## 2021-07-23 ENCOUNTER — Other Ambulatory Visit: Payer: Self-pay

## 2021-07-23 ENCOUNTER — Encounter: Payer: Medicare PPO | Admitting: Dietician

## 2021-07-23 DIAGNOSIS — E11649 Type 2 diabetes mellitus with hypoglycemia without coma: Secondary | ICD-10-CM

## 2021-07-23 NOTE — Progress Notes (Signed)
Patient is here today alone. She was last seen by myself 07/13/2021 for [facility] training. The sensor stopped working 1 hour after insertion and when she removed it, it was full of blood.    She arrived today for further training. Patient removed the transmitter from the sensor.  Transmitter was cleaned.  New sensor applied to patient's abdomen.  Transmitter put into place and sensor was started.  Archivist.  Patient to call for further problems.  She has the toll free 24 hour Dexcom line.  She will follow up with myself in September re:  nutrition follow up.  Patient to reach out with any questions via My Chart or phone.  Antonieta Iba, RD, LDN, CDCES

## 2021-07-25 ENCOUNTER — Other Ambulatory Visit: Payer: Self-pay | Admitting: Family Medicine

## 2021-07-26 ENCOUNTER — Ambulatory Visit: Payer: Medicare PPO | Admitting: Podiatrist

## 2021-07-26 ENCOUNTER — Telehealth: Payer: Self-pay | Admitting: *Deleted

## 2021-07-26 ENCOUNTER — Other Ambulatory Visit: Payer: Self-pay

## 2021-07-26 ENCOUNTER — Encounter: Payer: Self-pay | Admitting: Podiatrist

## 2021-07-26 DIAGNOSIS — E1142 Type 2 diabetes mellitus with diabetic polyneuropathy: Secondary | ICD-10-CM

## 2021-07-26 DIAGNOSIS — M79609 Pain in unspecified limb: Secondary | ICD-10-CM | POA: Diagnosis not present

## 2021-07-26 DIAGNOSIS — B351 Tinea unguium: Secondary | ICD-10-CM

## 2021-07-26 MED ORDER — DULOXETINE HCL 20 MG PO CPEP: ORAL_CAPSULE | ORAL | 0 refills | Status: DC

## 2021-07-26 MED ORDER — NONFORMULARY OR COMPOUNDED ITEM
0 refills | Status: DC
Start: 2021-07-26 — End: 2022-05-10

## 2021-07-26 NOTE — Progress Notes (Signed)
Chief Complaint  Patient presents with   Nail Problem    Nail fungus thickened nails      HPI: Patient is 70 y.o. female who presents today for thickened left hallux nail of which she states is getting thicker over time. She relates pain at times in the toe due to the thickness. Remainder of nails also bothersome as well. She has neuropathy and relates she does well with the cymbalta.  She is also diabetic   Allergies  Allergen Reactions   Amoxicillin-Pot Clavulanate     Severe diarrhea   Codeine Sulfate     REACTION: "hyper"   Erythromycin Base    Invokana [Canagliflozin] Other (See Comments)    Recurrent yeast infections   Irbesartan-Hydrochlorothiazide    Latex    Victoza [Liraglutide] Nausea Only   Ozempic (0.25 Or 0.5 Mg-Dose) [Semaglutide(0.25 Or 0.56m-Dos)] Nausea Only    Review of systems is reviewed and negative.   Physical Exam  Patient is awake, alert, and oriented x 3.  In no acute distress.    Vascular status is intact with palpable pedal pulses DP and PT bilateral and capillary refill time less than 3 seconds bilateral.  No edema or erythema noted.   Neurological exam reveals decreased sensation via SWMF 5.07 at 4/5 sites bilateral.  Vibratory sensation decreased as well.   Dermatological exam reveals skin is supple and dry to bilateral feet.  No open lesions present.  Left hallux nail has become thickened with subungual debris present and yellow discoloration of the nail noted.   Lesser digits are elongated and brittle with some discoloration present.   Musculoskeletal exam: Musculature intact with dorsiflexion, plantarflexion, inversion, eversion. Ankle and First MPJ joint range of motion normal.    Assessment:   ICD-10-CM   1. Pain due to onychomycosis of nail  B35.1    M79.609     2. Diabetic peripheral neuropathy associated with type 2 diabetes mellitus ([place]  E11.42         Plan: Discussed exam findings with the patient and discussed treating the  symptomatic toenail.  After further discussion she related that she has had liver problems in the past and therefore we ruled out using an oral antifungal.  I recommended she try the topical medication [facility] [facility] written and faxed over for her. Today, I debrided all toenails in length and thickness with a nail nipper and power burr. Especially the left hallux nail. I recommended routine debridements for her. She will be seen back at 3 months for continued care and will call if any problems or concerns arise.

## 2021-07-26 NOTE — Patient Instructions (Signed)
I have ordered a medication for you that will come from [facility] in [place]. They should be calling you to verify insurance and will mail the medication to you. If you live close by then you can go by their pharmacy to pick up the medication. Their phone number is [phone]. If you do not hear from them in the next few days, please give us a call at [phone].   

## 2021-07-26 NOTE — Telephone Encounter (Signed)
Faxed prescription to [facility]

## 2021-07-28 DIAGNOSIS — L820 Inflamed seborrheic keratosis: Secondary | ICD-10-CM | POA: Diagnosis not present

## 2021-07-28 DIAGNOSIS — L304 Erythema intertrigo: Secondary | ICD-10-CM | POA: Diagnosis not present

## 2021-07-28 DIAGNOSIS — Z85828 Personal history of other malignant neoplasm of skin: Secondary | ICD-10-CM | POA: Diagnosis not present

## 2021-07-28 DIAGNOSIS — L720 Epidermal cyst: Secondary | ICD-10-CM | POA: Diagnosis not present

## 2021-08-02 ENCOUNTER — Telehealth: Payer: Self-pay | Admitting: Dietician

## 2021-08-02 NOTE — Telephone Encounter (Signed)
Returned patient call. Patient is having problems after starting her new sensor.   It sounds like she did not end the last sensor session and may need to reapply a new sensor.  She did not go through the complete process of starting the sensor.  Discussed that she could try disabling the sensor session now and pushing start new sensor and enter the sensor code to see if she can get it to pair.  She will try and call tomorrow if needed.  Patient to call for questions.  Antonieta Iba, RD, LDN, CDCES

## 2021-08-04 ENCOUNTER — Ambulatory Visit (INDEPENDENT_AMBULATORY_CARE_PROVIDER_SITE_OTHER): Payer: Medicare PPO

## 2021-08-04 DIAGNOSIS — Z0000 Encounter for general adult medical examination without abnormal findings: Secondary | ICD-10-CM | POA: Diagnosis not present

## 2021-08-04 NOTE — Patient Instructions (Signed)
Dana Dyer , Thank you for taking time to come for your Medicare Wellness Visit. I appreciate your ongoing commitment to your health goals. Please review the following plan we discussed and let me know if I can assist you in the future.   Screening recommendations/referrals: Colonoscopy: 12/26/2018  due 2023 Mammogram: 09/02/2021 appointment  Bone Density: pt to schedule  Recommended yearly ophthalmology/optometry visit for glaucoma screening and checkup Recommended yearly dental visit for hygiene and checkup  Vaccinations: Influenza vaccine: due in fall 2022  Pneumococcal vaccine: completed series  Tdap vaccine: 12/15/2011 Shingles vaccine: will obtain local pharmacy   Advanced directives: will provide copies   Conditions/risks identified: none   Next appointment: none    Preventive Care 8 Years and Older, Female Preventive care refers to lifestyle choices and visits with your health care provider that can promote health and wellness. What does preventive care include? A yearly physical exam. This is also called an annual well check. Dental exams once or twice a year. Routine eye exams. Ask your health care provider how often you should have your eyes checked. Personal lifestyle choices, including: Daily care of your teeth and gums. Regular physical activity. Eating a healthy diet. Avoiding tobacco and drug use. Limiting alcohol use. Practicing safe sex. Taking low-dose aspirin every day. Taking vitamin and mineral supplements as recommended by your health care provider. What happens during an annual well check? The services and screenings done by your health care provider during your annual well check will depend on your age, overall health, lifestyle risk factors, and family history of disease. Counseling  Your health care provider may ask you questions about your: Alcohol use. Tobacco use. Drug use. Emotional well-being. Home and relationship well-being. Sexual  activity. Eating habits. History of falls. Memory and ability to understand (cognition). Work and work Statistician. Reproductive health. Screening  You may have the following tests or measurements: Height, weight, and BMI. Blood pressure. Lipid and cholesterol levels. These may be checked every 5 years, or more frequently if you are over 73 years old. Skin check. Lung cancer screening. You may have this screening every year starting at age 58 if you have a 30-pack-year history of smoking and currently smoke or have quit within the past 15 years. Fecal occult blood test (FOBT) of the stool. You may have this test every year starting at age 96. Flexible sigmoidoscopy or colonoscopy. You may have a sigmoidoscopy every 5 years or a colonoscopy every 10 years starting at age 70. Hepatitis C blood test. Hepatitis B blood test. Sexually transmitted disease (STD) testing. Diabetes screening. This is done by checking your blood sugar (glucose) after you have not eaten for a while (fasting). You may have this done every 1-3 years. Bone density scan. This is done to screen for osteoporosis. You may have this done starting at age 20. Mammogram. This may be done every 1-2 years. Talk to your health care provider about how often you should have regular mammograms. Talk with your health care provider about your test results, treatment options, and if necessary, the need for more tests. Vaccines  Your health care provider may recommend certain vaccines, such as: Influenza vaccine. This is recommended every year. Tetanus, diphtheria, and acellular pertussis (Tdap, Td) vaccine. You may need a Td booster every 10 years. Zoster vaccine. You may need this after age 67. Pneumococcal 13-valent conjugate (PCV13) vaccine. One dose is recommended after age 38. Pneumococcal polysaccharide (PPSV23) vaccine. One dose is recommended after age 65. Talk to  your health care provider about which screenings and vaccines  you need and how often you need them. This information is not intended to replace advice given to you by your health care provider. Make sure you discuss any questions you have with your health care provider. Document Released: 12/18/2015 Document Revised: 08/10/2016 Document Reviewed: 09/22/2015 Elsevier Interactive Patient Education  2017 [place] Prevention in the Home Falls can cause injuries. They can happen to people of all ages. There are many things you can do to make your home safe and to help prevent falls. What can I do on the outside of my home? Regularly fix the edges of walkways and driveways and fix any cracks. Remove anything that might make you trip as you walk through a door, such as a raised step or threshold. Trim any bushes or trees on the path to your home. Use bright outdoor lighting. Clear any walking paths of anything that might make someone trip, such as rocks or tools. Regularly check to see if handrails are loose or broken. Make sure that both sides of any steps have handrails. Any raised decks and porches should have guardrails on the edges. Have any leaves, snow, or ice cleared regularly. Use sand or salt on walking paths during winter. Clean up any spills in your garage right away. This includes oil or grease spills. What can I do in the bathroom? Use night lights. Install grab bars by the toilet and in the tub and shower. Do not use towel bars as grab bars. Use non-skid mats or decals in the tub or shower. If you need to sit down in the shower, use a plastic, non-slip stool. Keep the floor dry. Clean up any water that spills on the floor as soon as it happens. Remove soap buildup in the tub or shower regularly. Attach bath mats securely with double-sided non-slip rug tape. Do not have throw rugs and other things on the floor that can make you trip. What can I do in the bedroom? Use night lights. Make sure that you have a light by your bed that  is easy to reach. Do not use any sheets or blankets that are too big for your bed. They should not hang down onto the floor. Have a firm chair that has side arms. You can use this for support while you get dressed. Do not have throw rugs and other things on the floor that can make you trip. What can I do in the kitchen? Clean up any spills right away. Avoid walking on wet floors. Keep items that you use a lot in easy-to-reach places. If you need to reach something above you, use a strong step stool that has a grab bar. Keep electrical cords out of the way. Do not use floor polish or wax that makes floors slippery. If you must use wax, use non-skid floor wax. Do not have throw rugs and other things on the floor that can make you trip. What can I do with my stairs? Do not leave any items on the stairs. Make sure that there are handrails on both sides of the stairs and use them. Fix handrails that are broken or loose. Make sure that handrails are as long as the stairways. Check any carpeting to make sure that it is firmly attached to the stairs. Fix any carpet that is loose or worn. Avoid having throw rugs at the top or bottom of the stairs. If you do have throw rugs, attach  them to the floor with carpet tape. Make sure that you have a light switch at the top of the stairs and the bottom of the stairs. If you do not have them, ask someone to add them for you. What else can I do to help prevent falls? Wear shoes that: Do not have high heels. Have rubber bottoms. Are comfortable and fit you well. Are closed at the toe. Do not wear sandals. If you use a stepladder: Make sure that it is fully opened. Do not climb a closed stepladder. Make sure that both sides of the stepladder are locked into place. Ask someone to hold it for you, if possible. Clearly mark and make sure that you can see: Any grab bars or handrails. First and last steps. Where the edge of each step is. Use tools that help you  move around (mobility aids) if they are needed. These include: Canes. Walkers. Scooters. Crutches. Turn on the lights when you go into a dark area. Replace any light bulbs as soon as they burn out. Set up your furniture so you have a clear path. Avoid moving your furniture around. If any of your floors are uneven, fix them. If there are any pets around you, be aware of where they are. Review your medicines with your doctor. Some medicines can make you feel dizzy. This can increase your chance of falling. Ask your doctor what other things that you can do to help prevent falls. This information is not intended to replace advice given to you by your health care provider. Make sure you discuss any questions you have with your health care provider. Document Released: 09/17/2009 Document Revised: 04/28/2016 Document Reviewed: 12/26/2014 Elsevier Interactive Patient Education  2017 [employer].

## 2021-08-04 NOTE — Progress Notes (Signed)
Subjective:   Dana Dyer is a 70 y.o. female who presents for Medicare Annual (Subsequent) preventive examination.   Virtual Visit via Video Note  I connected with Dana Dyer by a video enabled telemedicine application and verified that I am speaking with the correct person using two identifiers.  Location: Patient: Home Provider: Office Persons participating in the virtual visit: patient, provider   I discussed the limitations of evaluation and management by telemedicine and the availability of in person appointments. The patient expressed understanding and agreed to proceed.     [place]  Review of Systems    N/a       Objective:    There were no vitals filed for this visit. There is no height or weight on file to calculate BMI.  Advanced Directives 07/12/2021 09/01/2020 07/25/2018 05/11/2017 03/21/2017 09/21/2016 11/16/2015  Does Patient Have a Medical Advance Directive? Yes Yes No Yes Yes No;Yes Yes  Type of Advance Directive - [place];Living will - - - Living will;Healthcare Power of [place];Living will  Does patient want to make changes to medical advance directive? - No - Patient declined - No - Patient declined - No - Patient declined -  Copy of Healthcare Power of Attorney in Chart? - No - copy requested - - - No - copy requested -    Current Medications (verified) Outpatient Encounter Medications as of 08/04/2021  Medication Sig   amLODipine (NORVASC) 5 MG tablet TAKE 1 TABLET BY MOUTH EVERY DAY   BD PEN NEEDLE NANO 2ND GEN 32G X 4 MM MISC USE AS DIRECTED   betamethasone dipropionate 0.05 % cream    Continuous Blood Gluc Sensor (DEXCOM G6 SENSOR) MISC 1 Device by Does not apply route as directed.   Continuous Blood Gluc Transmit (DEXCOM G6 TRANSMITTER) MISC 1 Device by Does not apply route as directed.   DULoxetine (CYMBALTA) 20 MG capsule TAKE 1 CAPSULE BY MOUTH EVERY DAY   fluconazole (DIFLUCAN)  100 MG tablet Take 1 tablet (100 mg total) by mouth daily.   furosemide (LASIX) 20 MG tablet Take 1 tablet (20 mg total) by mouth daily.   glucose blood (ACCU-CHEK AVIVA PLUS) test strip Use to check BS 4x a day   hydrochlorothiazide (HYDRODIURIL) 25 MG tablet TAKE 1 TABLET BY MOUTH EVERY DAY   insulin lispro (HUMALOG KWIKPEN) 100 UNIT/ML KwikPen Inject 0.1 mLs (10 Units total) into the skin daily with breakfast AND 0.15 mLs (15 Units total) daily with lunch AND 0.15 mLs (15 Units total) daily with supper.   iron polysaccharides (NU-IRON) 150 MG capsule Take 1 capsule (150 mg total) by mouth daily.   KLOR-CON M20 20 MEQ tablet TAKE 1 TABLET BY MOUTH EVERY DAY   Lancets (ONETOUCH DELICA PLUS VELFYB01B) MISC USE TO CHECK BLOOD GLUCOSE 3 TIMES A DAY   LANTUS SOLOSTAR 100 UNIT/ML Solostar Pen INJECT 30 UNITS INTO THE SKIN DAILY.   losartan (COZAAR) 100 MG tablet TAKE 1 TABLET BY MOUTH EVERY DAY   metFORMIN (GLUCOPHAGE) 1000 MG tablet TAKE 1 TABLET BY MOUTH TWICE A DAY   MISC NATURAL PRODUCTS PO Take by mouth.    nadolol (CORGARD) 40 MG tablet TAKE 1 TABLET BY MOUTH EVERY DAY   NONFORMULARY OR COMPOUNDED ITEM Antifungal-terbinafine-3%, fluconazole -2%, tea tree oil-5%,urea- 10%,in DMSO suspension # 26m, apply to the affected nail(s) once (at bedtime) or twice daily.   ondansetron (ZOFRAN) 4 MG tablet Take 1 tablet (4 mg total) by mouth  2 (two) times daily as needed for nausea.   rosuvastatin (CRESTOR) 10 MG tablet TAKE 1 TABLET BY MOUTH EVERY DAY   Facility-Administered Encounter Medications as of 08/04/2021  Medication   TDaP (BOOSTRIX) injection 0.5 mL    Allergies (verified) Amoxicillin-pot clavulanate, Codeine sulfate, Erythromycin base, Invokana [canagliflozin], Irbesartan-hydrochlorothiazide, Latex, Victoza [liraglutide], and Ozempic (0.25 or 0.5 mg-dose) [semaglutide(0.25 or 0.76m-dos)]   History: Past Medical History:  Diagnosis Date   ALLERGIC RHINITIS 10/09/2007   Allergy     Arthritis    hips, spine   DIABETES MELLITUS, UNCONTROLLED 03/16/2009   FATIGUE 10/21/2008   FIBROMYALGIA 10/09/2007   GERD (gastroesophageal reflux disease)    diet  controlled, no meds   H/O vaginal hysterectomy    HYPERLIPIDEMIA 10/09/2007   diet controlled, no meds   HYPERTENSION 10/09/2007   Morton's neuroma    OBSTRUCTIVE SLEEP APNEA 10/09/2007   Plantar fasciitis    History - right   Sleep apnea    Uses CPAP   TRANSAMINASES, SERUM, ELEVATED 07/08/2009   Past Surgical History:  Procedure Laterality Date   ABDOMINAL HYSTERECTOMY  1979   prolapsed uterus   BASAL CELL CARCINOMA EXCISION     BREAST BIOPSY     left 2012 stereo   BREAST REDUCTION SURGERY Bilateral 1992   COLONOSCOPY  2006   [place]   mamoplasty     reduction   REDUCTION MAMMAPLASTY Bilateral    TONSILLECTOMY  1959   TUBAL LIGATION  1976   Family History  Problem Relation Age of Onset   Diabetes Mother    Arthritis Mother    Cancer Mother        breast   Breast cancer Mother 779  Alcohol abuse Father    Arthritis Father    Hypertension Maternal Grandfather    Miscarriages / Stillbirths Paternal Grandfather    Breast cancer Maternal Aunt        diagnosed in her 731's  Colon polyps Neg Hx    Colon cancer Neg Hx    Esophageal cancer Neg Hx    Rectal cancer Neg Hx    Stomach cancer Neg Hx    Social History   Socioeconomic History   Marital status: Married    Spouse name: Not on file   Number of children: Not on file   Years of education: Not on file   Highest education level: Not on file  Occupational History   Not on file  Tobacco Use   Smoking status: Never   Smokeless tobacco: Never  Vaping Use   Vaping Use: Never used  Substance and Sexual Activity   Alcohol use: No    Alcohol/week: 0.0 standard drinks   Drug use: No   Sexual activity: Yes    Birth control/protection: Other-see comments, Post-menopausal    Comment: Hysterectomy  Other Topics Concern   Not on file  Social History  Narrative   Not on file   Social Determinants of Health   Financial Resource Strain: Low Risk    Difficulty of Paying Living Expenses: Not hard at all  Food Insecurity: No Food Insecurity   Worried About RCharity fundraiserin the Last Year: Never true   RArboriculturistin the Last Year: Never true  Transportation Needs: No Transportation Needs   Lack of Transportation (Medical): No   Lack of Transportation (Non-Medical): No  Physical Activity: Insufficiently Active   Days of Exercise per Week: 1 day   Minutes of Exercise per  Session: 30 min  Stress: No Stress Concern Present   Feeling of Stress : Not at all  Social Connections: Socially Integrated   Frequency of Communication with Friends and Family: Three times a week   Frequency of Social Gatherings with Friends and Family: More than three times a week   Attends Religious Services: More than 4 times per year   Active Member of [employer] or Organizations: Yes   Attends Music therapist: More than 4 times per year   Marital Status: Married    Tobacco Counseling Counseling given: Not Answered   Clinical Intake:                 Diabetic?yes  Nutrition Risk Assessment:  Has the patient had any N/V/D within the last 2 months?  No  Does the patient have any non-healing wounds?  No  Has the patient had any unintentional weight loss or weight gain?  No   Diabetes:  Is the patient diabetic?  Yes  If diabetic, was a CBG obtained today?  No  Did the patient bring in their glucometer from home?  No  How often do you monitor your CBG's? Dexcon .   Financial Strains and Diabetes Management:  Are you having any financial strains with the device, your supplies or your medication? No .  Does the patient want to be seen by Chronic Care Management for management of their diabetes?  No  Would the patient like to be referred to a Nutritionist or for Diabetic Management?  No   Diabetic Exams:  Diabetic Eye Exam:  Completed 08/2020 Diabetic Foot Exam: Overdue, Pt has been advised about the importance in completing this exam. Pt is scheduled for diabetic foot exam on next office visit .          Activities of Daily Living In your present state of health, do you have any difficulty performing the following activities: 09/01/2020  Hearing? N  Vision? Y  Comment Has noticed a tiny differences with vision was told that she has cataracts  Difficulty concentrating or making decisions? N  Walking or climbing stairs? Y  Comment Has had issues since back problems started  Dressing or bathing? N  Doing errands, shopping? N  Preparing Food and eating ? N  Using the Toilet? N  In the past six months, have you accidently leaked urine? N  Do you have problems with loss of bowel control? N  Managing your Medications? N  Managing your Finances? N  Housekeeping or managing your Housekeeping? N  Some recent data might be hidden    Patient Care Team: Eulas Post, MD as PCP - General  Indicate any recent Medical Services you may have received from other than [facility] providers in the past year (date may be approximate).     Assessment:   This is a routine wellness examination for Dana Dyer.  Hearing/Vision screen No results found.  Dietary issues and exercise activities discussed:     Goals Addressed   None    Depression Screen PHQ 2/9 Scores 07/12/2021 10/26/2020 09/01/2020 07/05/2019 06/25/2018 07/31/2017 07/18/2017  PHQ - 2 Score 0 0 0 0 0 1 5  PHQ- 9 Score - - 0 4 - - 19    Fall Risk Fall Risk  07/12/2021 10/26/2020 09/01/2020 07/05/2019 06/25/2018  Falls in the past year? 0 0 0 0 No  Number falls in past yr: - - 0 - -  Injury with Fall? - - 0 - -  Risk for fall due to : - - No Fall Risks - -  Follow up - - Falls evaluation completed;Falls prevention discussed - -    FALL RISK PREVENTION PERTAINING TO THE HOME:  Any stairs in or around the home? Yes  If so, are there any without handrails? No   Home free of loose throw rugs in walkways, pet beds, electrical cords, etc? Yes  Adequate lighting in your home to reduce risk of falls? No   ASSISTIVE DEVICES UTILIZED TO PREVENT FALLS:  Life alert? No  Use of a cane, walker or w/c? No  Grab bars in the bathroom? Yes  Shower chair or bench in shower? Yes  Elevated toilet seat or a handicapped toilet? Yes    Cognitive Function: Normal cognitive status assessed by direct observation by this Nurse Health Advisor. No abnormalities found.   MMSE - Mini Mental State Exam 03/21/2017  Not completed: (No Data)        Immunizations Immunization History  Administered Date(s) Administered   Fluad Quad(high Dose 65+) 08/27/2019   Hep A / Hep B 12/10/2018, 12/10/2018, 12/18/2018, 12/18/2018, 12/31/2018, 12/31/2018, 01/07/2020   Influenza Split 12/15/2011, 09/25/2012   Influenza Whole 08/30/2010   Influenza, High Dose Seasonal PF 09/01/2015, 09/19/2016, 07/31/2017, 09/27/2018   Influenza,inj,Quad PF,6+ Mos 08/06/2013, 09/22/2014   Moderna Sars-Covid-2 Vaccination 01/18/2020, 02/15/2020, 08/05/2020, 04/12/2021   Pneumococcal Conjugate-13 12/08/2011, 09/01/2015   Pneumococcal Polysaccharide-23 10/04/2016   Td 12/05/1998   Tdap 12/15/2011   Zoster, Live 01/24/2013    TDAP status: Up to date  Flu Vaccine status: Up to date  Pneumococcal vaccine status: Up to date  Covid-19 vaccine status: Completed vaccines  Qualifies for Shingles Vaccine? Yes   Zostavax completed No   Shingrix Completed?: No.    Education has been provided regarding the importance of this vaccine. Patient has been advised to call insurance company to determine out of pocket expense if they have not yet received this vaccine. Advised may also receive vaccine at local pharmacy or Health Dept. Verbalized acceptance and understanding.  Screening Tests Health Maintenance  Topic Date Due   Zoster Vaccines- Shingrix (1 of 2) Never done   FOOT EXAM  05/29/2020    INFLUENZA VACCINE  07/05/2021   OPHTHALMOLOGY EXAM  09/03/2021   HEMOGLOBIN A1C  12/02/2021   TETANUS/TDAP  12/14/2021   COLONOSCOPY (Pts 45-31yr Insurance coverage will need to be confirmed)  12/26/2021   MAMMOGRAM  07/14/2022   DEXA SCAN  Completed   COVID-19 Vaccine  Completed   PNA vac Low Risk Adult  Completed   HPV VACCINES  Aged Out    Health Maintenance  Health Maintenance Due  Topic Date Due   Zoster Vaccines- Shingrix (1 of 2) Never done   FOOT EXAM  05/29/2020   INFLUENZA VACCINE  07/05/2021    Colorectal cancer screening: Type of screening: Colonoscopy. Completed 12/26/2018. Repeat every 3 years  Mammogram status: Completed Scheduled 09/02/2021. Repeat every year  Bone Density status: Completed pt to schedule appt . Results reflect: Bone density results: OSTEOPENIA. Repeat every 5 years.  Lung Cancer Screening: (Low Dose CT Chest recommended if Age 666-80years, 30 pack-year currently smoking OR have quit w/in 15years.) does not qualify.   Lung Cancer Screening Referral: n/a  Additional Screening:  Hepatitis C Screening: does qualify;   Vision Screening: Recommended annual ophthalmology exams for early detection of glaucoma and other disorders of the eye. Is the patient up to date with their annual eye exam?  Yes  Who is the provider or what is the name of the office in which the patient attends annual eye exams? [facility]  If pt is not established with a provider, would they like to be referred to a provider to establish care? No .   Dental Screening: Recommended annual dental exams for proper oral hygiene  Community Resource Referral / Chronic Care Management: CRR required this visit?  No   CCM required this visit?  No      Plan:     I have personally reviewed and noted the following in the patient's chart:   Medical and social history Use of alcohol, tobacco or illicit drugs  Current medications and supplements including opioid  prescriptions.  Functional ability and status Nutritional status Physical activity Advanced directives List of other physicians Hospitalizations, surgeries, and ER visits in previous 12 months Vitals Screenings to include cognitive, depression, and falls Referrals and appointments  In addition, I have reviewed and discussed with patient certain preventive protocols, quality metrics, and best practice recommendations. A written personalized care plan for preventive services as well as general preventive health recommendations were provided to patient.     Randel Pigg, LPN   [id number]   Nurse Notes: none

## 2021-08-12 ENCOUNTER — Telehealth: Payer: Self-pay | Admitting: Dietician

## 2021-08-12 NOTE — Telephone Encounter (Signed)
Returned patient call.  She is scheduled for a follow up on 9/16 but will be out of town for her birthday and would like to reschedule.   Patient agreeable to changing the appointment to 08/27/21. She states that the Dexcom is working quite well.  She realizes that she is frequently 80 in the night.  This is low for her.  She has started eating a cracker and small piece of cheese and this has helped stabilize her blood glucose. She is now more consistent about taking her insulin before her meal. She has been more motivated to exercise.  Patient to call for questions.  Antonieta Iba, RD, LDN, CDCES

## 2021-08-16 ENCOUNTER — Other Ambulatory Visit: Payer: Self-pay

## 2021-08-16 MED ORDER — INSULIN LISPRO (1 UNIT DIAL) 100 UNIT/ML (KWIKPEN): PEN_INJECTOR | SUBCUTANEOUS | 1 refills | Status: DC

## 2021-08-16 NOTE — Telephone Encounter (Signed)
New script sent

## 2021-08-17 DIAGNOSIS — E1165 Type 2 diabetes mellitus with hyperglycemia: Secondary | ICD-10-CM | POA: Diagnosis not present

## 2021-08-20 ENCOUNTER — Ambulatory Visit: Payer: Medicare PPO | Admitting: Dietician

## 2021-08-27 ENCOUNTER — Encounter: Payer: Medicare PPO | Attending: Internal Medicine | Admitting: Dietician

## 2021-08-27 DIAGNOSIS — E11649 Type 2 diabetes mellitus with hypoglycemia without coma: Secondary | ICD-10-CM | POA: Insufficient documentation

## 2021-09-02 ENCOUNTER — Other Ambulatory Visit: Payer: Self-pay

## 2021-09-02 ENCOUNTER — Ambulatory Visit
Admission: RE | Admit: 2021-09-02 | Discharge: 2021-09-02 | Disposition: A | Discharge status: Home / Self Care | Payer: Medicare PPO | Source: Ambulatory Visit | Attending: Obstetrics and Gynecology | Admitting: Obstetrics and Gynecology

## 2021-09-02 DIAGNOSIS — Z1231 Encounter for screening mammogram for malignant neoplasm of breast: Secondary | ICD-10-CM

## 2021-09-06 DIAGNOSIS — H2513 Age-related nuclear cataract, bilateral: Secondary | ICD-10-CM | POA: Diagnosis not present

## 2021-09-06 DIAGNOSIS — H524 Presbyopia: Secondary | ICD-10-CM | POA: Diagnosis not present

## 2021-09-06 DIAGNOSIS — E119 Type 2 diabetes mellitus without complications: Secondary | ICD-10-CM | POA: Diagnosis not present

## 2021-09-09 ENCOUNTER — Ambulatory Visit: Payer: Medicare PPO | Admitting: Family Medicine

## 2021-09-14 ENCOUNTER — Other Ambulatory Visit: Payer: Self-pay

## 2021-09-14 ENCOUNTER — Encounter: Payer: Medicare PPO | Attending: Internal Medicine | Admitting: Dietician

## 2021-09-14 ENCOUNTER — Encounter: Payer: Self-pay | Admitting: Dietician

## 2021-09-14 DIAGNOSIS — E11649 Type 2 diabetes mellitus with hypoglycemia without coma: Secondary | ICD-10-CM | POA: Insufficient documentation

## 2021-09-14 NOTE — Patient Instructions (Signed)
Consistent meals and meal times Consistent use of humalog

## 2021-09-14 NOTE — [facility] Notes (Signed)
Patient was seen today alone.  She was last seen by myself 07/23/21 and followed up by phone 08/02/2021 and 08/12/21.  She reports much more confidence using the Dexcom and  has been noting her blood glucose more frequently and making decisions accordingly.  Set patient up for Dexcom sharing with the [place] office and printed her 14 day report today.   Her average blood glucose is 221. 32% time in range with 0 lows.  Medications include:  Lantus 35 units q HS, Humalog 18-20 units before each meal plus sliding scale (1 unit for every 25 points above 140, Metformin  She reports now being more consistent with her medication. Her glucose is consistently high with meals. She has an inconsistent meal schedule.  At times just nibbles and expect she is not taking insulin for this.  Discussed importance of consistent meal schedule so she can be more consistent with the Humalog.   Discussed that she is missing opportunity to take a correction dose of insulin.  Follow up for nutrition scheduled for 11/18/2021.  Patient to call for any questions or concerns.  Antonieta Iba, RD, LDN, CDCES

## 2021-09-20 DIAGNOSIS — Z85828 Personal history of other malignant neoplasm of skin: Secondary | ICD-10-CM | POA: Diagnosis not present

## 2021-09-20 DIAGNOSIS — L820 Inflamed seborrheic keratosis: Secondary | ICD-10-CM | POA: Diagnosis not present

## 2021-09-20 DIAGNOSIS — D0461 Carcinoma in situ of skin of right upper limb, including shoulder: Secondary | ICD-10-CM | POA: Diagnosis not present

## 2021-09-20 DIAGNOSIS — D485 Neoplasm of uncertain behavior of skin: Secondary | ICD-10-CM | POA: Diagnosis not present

## 2021-09-20 DIAGNOSIS — L738 Other specified follicular disorders: Secondary | ICD-10-CM | POA: Diagnosis not present

## 2021-09-20 DIAGNOSIS — C44622 Squamous cell carcinoma of skin of right upper limb, including shoulder: Secondary | ICD-10-CM | POA: Diagnosis not present

## 2021-09-20 DIAGNOSIS — L918 Other hypertrophic disorders of the skin: Secondary | ICD-10-CM | POA: Diagnosis not present

## 2021-09-20 DIAGNOSIS — L821 Other seborrheic keratosis: Secondary | ICD-10-CM | POA: Diagnosis not present

## 2021-09-21 ENCOUNTER — Ambulatory Visit (INDEPENDENT_AMBULATORY_CARE_PROVIDER_SITE_OTHER): Payer: Medicare PPO

## 2021-09-21 ENCOUNTER — Other Ambulatory Visit: Payer: Self-pay

## 2021-09-21 DIAGNOSIS — Z23 Encounter for immunization: Secondary | ICD-10-CM

## 2021-09-30 ENCOUNTER — Ambulatory Visit: Payer: Medicare PPO | Admitting: Internal Medicine

## 2021-09-30 ENCOUNTER — Other Ambulatory Visit: Payer: Self-pay | Admitting: Family Medicine

## 2021-09-30 ENCOUNTER — Other Ambulatory Visit: Payer: Self-pay

## 2021-09-30 ENCOUNTER — Encounter: Payer: Self-pay | Admitting: Internal Medicine

## 2021-09-30 VITALS — BP 130/82 | HR 88 | Ht 62.0 in | Wt 174.0 lb

## 2021-09-30 DIAGNOSIS — Z794 Long term (current) use of insulin: Secondary | ICD-10-CM | POA: Diagnosis not present

## 2021-09-30 DIAGNOSIS — E1165 Type 2 diabetes mellitus with hyperglycemia: Secondary | ICD-10-CM

## 2021-09-30 DIAGNOSIS — E785 Hyperlipidemia, unspecified: Secondary | ICD-10-CM

## 2021-09-30 DIAGNOSIS — E876 Hypokalemia: Secondary | ICD-10-CM

## 2021-09-30 DIAGNOSIS — K7460 Unspecified cirrhosis of liver: Secondary | ICD-10-CM

## 2021-09-30 LAB — LIPID PANEL
Cholesterol: 154 mg/dL (ref 0–200)
HDL: 59.3 mg/dL (ref >39.00)
LDL Cholesterol: 69 mg/dL (ref 0–99)
NonHDL: 94.29
Total CHOL/HDL Ratio: 3
Triglycerides: 128 mg/dL (ref 0.0–149.0)
VLDL: 25.6 mg/dL (ref 0.0–40.0)

## 2021-09-30 LAB — POCT GLYCOSYLATED HEMOGLOBIN (HGB A1C): Hemoglobin A1C: 7.8 % — AB (ref 4.0–5.6)

## 2021-09-30 LAB — MICROALBUMIN / CREATININE URINE RATIO
Creatinine,U: 13.1 mg/dL
Microalb Creat Ratio: 5.4 mg/g (ref 0.0–30.0)
Microalb, Ur: <0.7 mg/dL (ref 0.0–1.9)

## 2021-09-30 MED ORDER — LANTUS SOLOSTAR 100 UNIT/ML ~~LOC~~ SOPN: 30.0000 [IU] | PEN_INJECTOR | Freq: Every day | SUBCUTANEOUS | 3 refills | Status: DC

## 2021-09-30 MED ORDER — HUMALOG KWIKPEN 200 UNIT/ML ~~LOC~~ SOPN: PEN_INJECTOR | SUBCUTANEOUS | 3 refills | Status: DC

## 2021-09-30 NOTE — Patient Instructions (Addendum)
-   Keep Up the Good Work ! - Continue Lantus 34 units daily  - Change Humalog 10 units with Breakfast,  18 units Lunch and 22 units supper - Continue Metformin 1000 mg, 1 tablet twice daily   - Humalog correctional insulin: ADD extra units on insulin to your meal-time Humalog  dose if your blood sugars are higher than 165. Use the scale below to help guide you:   Blood sugar before meal Number of units to inject  Less than 165 0 unit  166 -  190 1 units  191 -  215 2 units  216 -  240 3 units  241 -  265 4 units  266 -  290 5 units  291 -  315 6 units  316 -  340 7 units  341 -  365 8 units  365 - 390 9 units   391-  415 10 units  416- 440 11 units  441- 465 12 units       HOW TO TREAT LOW BLOOD SUGARS (Blood sugar LESS THAN 70 MG/DL) Please follow the RULE OF 15 for the treatment of hypoglycemia treatment (when your (blood sugars are less than 70 mg/dL)   STEP 1: Take 15 grams of carbohydrates when your blood sugar is low, which includes:  3-4 GLUCOSE TABS  OR 3-4 OZ OF JUICE OR REGULAR SODA OR ONE TUBE OF GLUCOSE GEL    STEP 2: RECHECK blood sugar in 15 MINUTES STEP 3: If your blood sugar is still low at the 15 minute recheck --> then, go back to STEP 1 and treat AGAIN with another 15 grams of carbohydrates

## 2021-09-30 NOTE — [facility] Notes (Signed)
Name: Dana Dyer  Age/ Sex: 70 y.o., female   MRN/ DOB: [id number], Apr 30, 1951     PCP: Eulas Post, MD   Reason for Endocrinology Evaluation: Type 2 Diabetes Mellitus  Initial Endocrine Consultative Visit: 10/23/18    PATIENT IDENTIFIER: Dana Dyer is a 70 y.o. female with a past medical history of HTN, T2DM, Cirrhosis, Fibromyalgia and dyslipidemia . The patient has followed with Endocrinology clinic since 10/23/18 for consultative assistance with management of her diabetes.  DIABETIC HISTORY:  Dana Dyer was diagnosed with T2DM in 2005. She used to be on SU but does not recall intolerance. She is intolerant to victoza - nauea and invokana- yeast infection. She was started on insulin therapy in 2009. Her hemoglobin A1c has ranged from 7.8 % in 2012, peaking at 10.9% in 2018.   Ozempic was stopped in 06/2019 due to persistent nausea.   Switched pravastatin to crestor 09/2019  SUBJECTIVE:   During the last visit (06/02/2021): A1c was 10.3%. We continued metformin and adjusted MDI regimen   Today (09/30/2021): Dana Dyer is here for a follow up on diabetes management.  She checks her blood sugars multiple times a day through CGM.  The patient has not had hypoglycemic episodes since the last clinic visit per   Continues to follow up with Gi for cirrhosis   Denies nausea, vomiting or diarrhea   HOME DIABETES REGIMEN:  Lantus 35 units daily Humalog 10 units with Breakfast  Humalog 18 units with lunch and supper Metformin 1000 mg BID  Crestor 10 mg daily     CONTINUOUS GLUCOSE MONITORING RECORD INTERPRETATION    Dates of Recording: 10/14-10/27/2022  Sensor description:dexcom  Results statistics:   CGM use % of time 93  Average and SD 207/56  Time in range  34      %  % Time Above 180 43  % Time above 250 22  % Time Below target 0      Glycemic patterns summary: BG's optimal overnight, high during the day   Hyperglycemic episodes   postprandial   Hypoglycemic episodes occurred  n/a  Overnight periods: optimal    DIABETIC COMPLICATIONS: Microvascular complications:  Neuroathy Denies: retinopathy, nephropathy Last eye exam: Completed 09/06/2021     Macrovascular complications:   Denies: CAD, PVD, CVA    HISTORY:  Past Medical History:  Past Medical History:  Diagnosis Date   ALLERGIC RHINITIS 10/09/2007   Allergy    Arthritis    hips, spine   DIABETES MELLITUS, UNCONTROLLED 03/16/2009   FATIGUE 10/21/2008   FIBROMYALGIA 10/09/2007   GERD (gastroesophageal reflux disease)    diet  controlled, no meds   H/O vaginal hysterectomy    HYPERLIPIDEMIA 10/09/2007   diet controlled, no meds   HYPERTENSION 10/09/2007   Morton's neuroma    OBSTRUCTIVE SLEEP APNEA 10/09/2007   Plantar fasciitis    History - right   Sleep apnea    Uses CPAP   TRANSAMINASES, SERUM, ELEVATED 07/08/2009   Past Surgical History:  Past Surgical History:  Procedure Laterality Date   ABDOMINAL HYSTERECTOMY  1979   prolapsed uterus   BASAL CELL CARCINOMA EXCISION     BREAST BIOPSY Left 2012   left 2012 stereo   BREAST REDUCTION SURGERY Bilateral 1992   COLONOSCOPY  2006   [place]   mamoplasty     reduction   REDUCTION MAMMAPLASTY Bilateral [place]   Social History:  reports that she has never smoked. She has never used smokeless tobacco. She reports that she does not drink alcohol and does not use drugs. Family History:  Family History  Problem Relation Age of Onset   Diabetes Mother    Arthritis Mother    Cancer Mother        breast   Breast cancer Mother 32   Alcohol abuse Father    Arthritis Father    Hypertension Maternal Grandfather    Miscarriages / Stillbirths Paternal Grandfather    Breast cancer Maternal Aunt        diagnosed in her 19's   Colon polyps Neg Hx    Colon cancer Neg Hx    Esophageal cancer Neg Hx    Rectal cancer Neg Hx    Stomach cancer Neg Hx       HOME MEDICATIONS: Allergies as of 09/30/2021       Reactions   Amoxicillin-pot Clavulanate    Severe diarrhea   Codeine Sulfate    REACTION: "hyper"   Erythromycin Base    Invokana [canagliflozin] Other (See Comments)   Recurrent yeast infections   Irbesartan-hydrochlorothiazide    Latex    Victoza [liraglutide] Nausea Only   Ozempic (0.25 Or 0.5 Mg-dose) [semaglutide(0.25 Or 0.63m-dos)] Nausea Only        Medication List        Accurate as of September 30, 2021  9:25 AM. If you have any questions, ask your nurse or doctor.          STOP taking these medications    Accu-Chek Aviva Plus test strip Generic drug: glucose blood Stopped by: IDorita Sciara MD   fluconazole 100 MG tablet Commonly known as: Diflucan Stopped by: IDorita Sciara MD   OneTouch Delica Plus [id number] Stopped by: IDorita Sciara MD       TAKE these medications    amLODipine 5 MG tablet Commonly known as: NORVASC TAKE 1 TABLET BY MOUTH EVERY DAY   BD Pen Needle Nano 2nd Gen 32G X 4 MM Misc Generic drug: Insulin Pen Needle USE AS DIRECTED   betamethasone dipropionate 0.05 % cream   Dexcom G6 Sensor Misc 1 Device by Does not apply route as directed.   Dexcom G6 Transmitter Misc 1 Device by Does not apply route as directed.   DULoxetine 20 MG capsule Commonly known as: CYMBALTA TAKE 1 CAPSULE BY MOUTH EVERY DAY   furosemide 20 MG tablet Commonly known as: Lasix Take 1 tablet (20 mg total) by mouth daily.   hydrochlorothiazide 25 MG tablet Commonly known as: HYDRODIURIL TAKE 1 TABLET BY MOUTH EVERY DAY   insulin lispro 100 UNIT/ML KwikPen Commonly known as: HumaLOG KwikPen Inject 10 Units into the skin daily with breakfast AND 18 Units daily with lunch AND 18 Units daily with supper.   iron polysaccharides 150 MG capsule Commonly known as: Nu-Iron Take 1 capsule (150 mg total) by mouth daily.   Klor-Con M20 20 MEQ tablet Generic drug:  potassium chloride SA TAKE 1 TABLET BY MOUTH EVERY DAY   Lantus SoloStar 100 UNIT/ML Solostar Pen Generic drug: insulin glargine INJECT 30 UNITS INTO THE SKIN DAILY.   losartan 100 MG tablet Commonly known as: COZAAR TAKE 1 TABLET BY MOUTH EVERY DAY   metFORMIN 1000 MG tablet Commonly known as: GLUCOPHAGE TAKE 1 TABLET BY MOUTH TWICE A DAY   MISC NATURAL PRODUCTS PO Take by mouth.   nadolol 40 MG tablet Commonly known as: CORGARD TAKE  1 TABLET BY MOUTH EVERY DAY   NONFORMULARY OR COMPOUNDED ITEM Antifungal-terbinafine-3%, fluconazole -2%, tea tree oil-5%,urea- 10%,in DMSO suspension # 29m, apply to the affected nail(s) once (at bedtime) or twice daily.   ondansetron 4 MG tablet Commonly known as: Zofran Take 1 tablet (4 mg total) by mouth 2 (two) times daily as needed for nausea.   rosuvastatin 10 MG tablet Commonly known as: CRESTOR TAKE 1 TABLET BY MOUTH EVERY DAY         OBJECTIVE:   Vital Signs: BP 130/82 (BP Location: Left Arm, Patient Position: Sitting, Cuff Size: Small)   Pulse 88   Ht 5' 2"  (1.575 m)   Wt 174 lb (78.9 kg)   SpO2 99%   BMI 31.83 kg/m   Wt Readings from Last 3 Encounters:  09/30/21 174 lb (78.9 kg)  07/12/21 177 lb (80.3 kg)  06/02/21 175 lb (79.4 kg)     Exam: General: Pt appears well and is in NAD  Lungs: Clear with good BS bilat with no rales, rhonchi, or wheezes  Heart: RRR   Abdomen: Normoactive bowel sounds, soft, nontender, without masses or organomegaly palpable  Extremities: No  pretibial edema.  Neuro: MS is good with appropriate affect, pt is alert and Ox3   DM Foot 03/01/2021 The skin of the feet is intact without sores or ulcerations. The pedal pulses are 2+ on right and 2+ on left. The sensation is decreased  to a screening 5.07, 10 gram monofilament on the left      DATA REVIEWED:  Lab Results  Component Value Date   HGBA1C 7.8 (A) 09/30/2021   HGBA1C 10.3 (A) 06/02/2021   HGBA1C 8.5 (A) 03/01/2021    Results for LSAIA, DEROSSETT(MRN [id number] as of 10/01/2021 07:58  Ref. Range 09/30/2021 10:48  Total CHOL/HDL Ratio Unknown 3  Cholesterol Latest Ref Range: 0 - 200 mg/dL 154  HDL Cholesterol Latest Ref Range: >39.00 mg/dL 59.30  LDL (calc) Latest Ref Range: 0 - 99 mg/dL 69  NonHDL Unknown 94.29  Triglycerides Latest Ref Range: 0.0 - 149.0 mg/dL 128.0  VLDL Latest Ref Range: 0.0 - 40.0 mg/dL 25.6  Results for LSTEFANNIE, DEFEO(MRN [id number] as of 10/01/2021 07:58  Ref. Range 09/30/2021 10:43  Creatinine,U Latest Units: mg/dL 13.1  Microalb, Ur Latest Ref Range: 0.0 - 1.9 mg/dL <0.7  MICROALB/CREAT RATIO Latest Ref Range: 0.0 - 30.0 mg/g 5.4    ASSESSMENT / PLAN / RECOMMENDATIONS:   1) Type 2 Diabetes Mellitus, Sub-OPtimally  controlled, With neuropathic complications - Most recent A1c of 7.8 %. Goal A1c < 7.0 %.   -I have praised the patient on improved glycemic control, her A1c has decreased from 10.3% to 7.8% in just a 365-monthpan -She had concerns about metformin, she is not having any side effects to it, we have discussed the risk and the benefits, her GFR is normal, reassurance provided - She is intolerant to GLP-1 agonists and SGLT-2 inhibitors.  -She has been using the Dexcom , she is getting used to the application process seems to have some challenges early on, but overall satisfied with the technology -I am going to increase her prandial insulin with supper   MEDICATIONS: - Continue Lantus 34 units daily - Change Humalog 10 units with breakfast, 18 units with Lunch and 22 units with supper  - Continue metformin 1000 mg BID  - CF : HUmalog ( BG -140/25)    EDUCATION / INSTRUCTIONS: BG monitoring instructions: Patient is instructed  to check her blood sugars 4 times a day, before meals and bedtime. Call [facility] if: BG persistently < 70  I reviewed the Rule of 15 for the treatment of hypoglycemia in detail with the patient. Literature  supplied.   2) Dyslipidemia:   -Lipid panel today is at goal    Medication Continue Crestor 10 mg daily      F/U in 4 months    Signed electronically by: Mack Guise, MD  [facility]  [facility] [address] [place],  [address] Phone: [phone] FAX: [phone]   CC: Eulas Post, [place] [address] Phone: [phone]  Fax: [phone]  Return to Endocrinology clinic as below: Future Appointments  Date Time Provider [place]  09/30/2021  9:30 AM , Melanie Crazier, MD [facility] None  10/11/2021  9:15 AM Lyndal Pulley, DO [facility] None  11/02/2021 11:15 AM Marzetta Board, DPM [facility]  11/18/2021 11:15 AM Valora Piccolo, Barnabas Lister, RD [facility]

## 2021-10-01 MED ORDER — ROSUVASTATIN CALCIUM 10 MG PO TABS: 10.0000 mg | ORAL_TABLET | Freq: Every day | ORAL | 3 refills | Status: DC

## 2021-10-05 ENCOUNTER — Encounter: Payer: Self-pay | Admitting: Family Medicine

## 2021-10-05 ENCOUNTER — Ambulatory Visit: Payer: Medicare PPO | Admitting: Family Medicine

## 2021-10-05 ENCOUNTER — Ambulatory Visit (INDEPENDENT_AMBULATORY_CARE_PROVIDER_SITE_OTHER): Payer: Medicare PPO

## 2021-10-05 ENCOUNTER — Other Ambulatory Visit: Payer: Self-pay

## 2021-10-05 ENCOUNTER — Telehealth: Payer: Self-pay | Admitting: Internal Medicine

## 2021-10-05 VITALS — BP 112/60 | HR 83 | Ht 62.0 in | Wt 175.0 lb

## 2021-10-05 DIAGNOSIS — M542 Cervicalgia: Secondary | ICD-10-CM

## 2021-10-05 DIAGNOSIS — M5136 Other intervertebral disc degeneration, lumbar region: Secondary | ICD-10-CM

## 2021-10-05 DIAGNOSIS — M5030 Other cervical disc degeneration, unspecified cervical region: Secondary | ICD-10-CM | POA: Diagnosis not present

## 2021-10-05 MED ORDER — DULOXETINE HCL 30 MG PO CPEP: ORAL_CAPSULE | ORAL | 0 refills | Status: DC

## 2021-10-05 NOTE — Assessment & Plan Note (Signed)
Likely some degenerative disc disease and likely some facet arthropathy.  X-rays are pending.  Given exercises for more scapular strengthening that I think will be beneficial.  Discussed icing regimen and home exercises.  Increase activity slowly.  Worsening pain will need to consider formal physical therapy.  Follow-up again in 6 to 8 weeks

## 2021-10-05 NOTE — [facility] Notes (Signed)
Dana  Dyer [address] [place] Phone: [phone] Subjective:   Dana Dyer, am serving as a scribe for Dr. Hulan Saas. This visit occurred during the SARS-CoV-2 public health emergency.  Safety protocols were in place, including screening questions prior to the visit, additional usage of staff PPE, and extensive cleaning of exam room while observing appropriate contact time as indicated for disinfecting solutions.   I'm seeing this patient by the request  of:  Eulas Post, MD  CC: Low back pain follow-up  [id number]  Dana Dyer is a 70 y.o. female coming in with complaint of low back pain.  Last time I saw patient was greater than a year ago.  Patient has been on Cymbalta.  Been doing home exercises.  Patient states LBP did well for about 6-9 months. Fitness walking started the pain. Can't do the hills. Neck pain when turning head left and right. Flexion and extension also hurts. Sharp catching type pains, while back is more throbbing. Ibuprofen takes the edge off. Only takes it when she has to because of previous medical problems.     Past Medical History:  Diagnosis Date   ALLERGIC RHINITIS 10/09/2007   Allergy    Arthritis    hips, spine   DIABETES MELLITUS, UNCONTROLLED 03/16/2009   FATIGUE 10/21/2008   FIBROMYALGIA 10/09/2007   GERD (gastroesophageal reflux disease)    diet  controlled, no meds   H/O vaginal hysterectomy    HYPERLIPIDEMIA 10/09/2007   diet controlled, no meds   HYPERTENSION 10/09/2007   Morton's neuroma    OBSTRUCTIVE SLEEP APNEA 10/09/2007   Plantar fasciitis    History - right   Sleep apnea    Uses CPAP   TRANSAMINASES, SERUM, ELEVATED 07/08/2009   Past Surgical History:  Procedure Laterality Date   ABDOMINAL HYSTERECTOMY  1979   prolapsed uterus   BASAL CELL CARCINOMA EXCISION     BREAST BIOPSY Left 2012   left 2012 stereo   BREAST REDUCTION SURGERY Bilateral 1992   COLONOSCOPY   2006   [place]   mamoplasty     reduction   REDUCTION MAMMAPLASTY Bilateral 1992   TONSILLECTOMY  1959   TUBAL LIGATION  1976   Social History   Socioeconomic History   Marital status: Married    Spouse name: Not on file   Number of children: Not on file   Years of education: Not on file   Highest education level: Not on file  Occupational History   Not on file  Tobacco Use   Smoking status: Never   Smokeless tobacco: Never  Vaping Use   Vaping Use: Never used  Substance and Sexual Activity   Alcohol use: No    Alcohol/week: 0.0 standard drinks   Drug use: No   Sexual activity: Yes    Birth control/protection: Other-see comments, Post-menopausal    Comment: Hysterectomy  Other Topics Concern   Not on file  Social History Narrative   Not on file   Social Determinants of Health   Financial Resource Strain: Low Risk    Difficulty of Paying Living Expenses: Not hard at all  Food Insecurity: No Food Insecurity   Worried About Charity fundraiser in the Last Year: Never true   Arboriculturist in the Last Year: Never true  Transportation Needs: No Transportation Needs   Lack of Transportation (Medical): No   Lack of Transportation (Non-Medical): No  Physical Activity: Insufficiently Active  Days of Exercise per Week: 3 days   Minutes of Exercise per Session: 30 min  Stress: No Stress Concern Present   Feeling of Stress : Not at all  Social Connections: Unknown   Frequency of Communication with Friends and Family: Three times a week   Frequency of Social Gatherings with Friends and Family: Not on file   Attends Religious Services: Not on file   Active Member of Clubs or Organizations: No   Attends Archivist Meetings: Never   Marital Status: Married   Allergies  Allergen Reactions   Amoxicillin-Pot Clavulanate     Severe diarrhea   Codeine Sulfate     REACTION: "hyper"   Erythromycin Base    Invokana [Canagliflozin] Other (See Comments)     Recurrent yeast infections   Irbesartan-Hydrochlorothiazide    Latex    Victoza [Liraglutide] Nausea Only   Ozempic (0.25 Or 0.5 Mg-Dose) [Semaglutide(0.25 Or 0.37m-Dos)] Nausea Only   Family History  Problem Relation Age of Onset   Diabetes Mother    Arthritis Mother    Cancer Mother        breast   Breast cancer Mother 77  Alcohol abuse Father    Arthritis Father    Hypertension Maternal Grandfather    Miscarriages / Stillbirths Paternal Grandfather    Breast cancer Maternal Aunt        diagnosed in her 746's  Colon polyps Neg Hx    Colon cancer Neg Hx    Esophageal cancer Neg Hx    Rectal cancer Neg Hx    Stomach cancer Neg Hx     Current Outpatient Medications (Endocrine & Metabolic):    insulin glargine (LANTUS SOLOSTAR) 100 UNIT/ML Solostar Pen, Inject 30 Units into the skin daily.   insulin lispro (HUMALOG KWIKPEN) 200 UNIT/ML KwikPen, Max Daily 120   metFORMIN (GLUCOPHAGE) 1000 MG tablet, TAKE 1 TABLET BY MOUTH TWICE A DAY   Current Outpatient Medications (Cardiovascular):    amLODipine (NORVASC) 5 MG tablet, TAKE 1 TABLET BY MOUTH EVERY DAY   furosemide (LASIX) 20 MG tablet, Take 1 tablet (20 mg total) by mouth daily.   hydrochlorothiazide (HYDRODIURIL) 25 MG tablet, TAKE 1 TABLET BY MOUTH EVERY DAY   losartan (COZAAR) 100 MG tablet, TAKE 1 TABLET BY MOUTH EVERY DAY   nadolol (CORGARD) 40 MG tablet, TAKE 1 TABLET BY MOUTH EVERY DAY   rosuvastatin (CRESTOR) 10 MG tablet, Take 1 tablet (10 mg total) by mouth daily.       Current Outpatient Medications (Hematological):    iron polysaccharides (NU-IRON) 150 MG capsule, Take 1 capsule (150 mg total) by mouth daily.   Current Outpatient Medications (Other):    BD PEN NEEDLE NANO 2ND GEN 32G X 4 MM MISC, USE AS DIRECTED   betamethasone dipropionate 0.05 % cream,    Continuous Blood Gluc Sensor (DEXCOM G6 SENSOR) MISC, 1 Device by Does not apply route as directed.   Continuous Blood Gluc Transmit (DEXCOM G6  TRANSMITTER) MISC, 1 Device by Does not apply route as directed.   DULoxetine (CYMBALTA) 30 MG capsule, TAKE 1 CAPSULE BY MOUTH EVERY DAY   KLOR-CON M20 20 MEQ tablet, TAKE 1 TABLET BY MOUTH EVERY DAY   MISC NATURAL PRODUCTS PO, Take by mouth.    NONFORMULARY OR COMPOUNDED ITEM, Antifungal-terbinafine-3%, fluconazole -2%, tea tree oil-5%,urea- 10%,in DMSO suspension # 337m apply to the affected nail(s) once (at bedtime) or twice daily.   ondansetron (ZOFRAN) 4 MG tablet, Take 1 tablet (  4 mg total) by mouth 2 (two) times daily as needed for nausea.  Current Facility-Administered Medications (Other):    TDaP (BOOSTRIX) injection 0.5 mL   Reviewed prior external information including notes and imaging from  primary care provider this includes the last 1 on July 22, 2021 As well as notes that were available from care everywhere and other healthcare systems.  This includes patient's most recent visits to podiatry for on August 22 as well as multiple visits with endocrinology for her diabetes.  Past medical history, social, surgical and family history all reviewed in electronic medical record.  No pertanent information unless stated regarding to the chief complaint.   Review of Systems:  No headache, visual changes, nausea, vomiting, diarrhea, constipation, dizziness, abdominal pain, skin rash, fevers, chills, night sweats, weight loss, swollen lymph nodes, body aches, joint swelling, chest pain, shortness of breath, mood changes. POSITIVE muscle aches  Objective  Blood pressure 112/60, pulse 83, height 5' 2"  (1.575 m), weight 175 lb (79.4 kg), SpO2 95 %.   General: No apparent distress alert and oriented x3 mood and affect normal, dressed appropriately.  HEENT: Pupils equal, extraocular movements intact  Mild antalgic gait noted. Regarding patient's back seems to have some loss of lordosis.  He does have some degenerative scoliosis also noted.  Patient does have tightness with straight leg  test and FABER test. Neck exam does have some limited range of motion.  Patient has only 5 degrees of extension.  Patient has negative Spurling's.  5 out of 5 strength of the upper extremities.  Patient does have increasing kyphosis of the upper thoracic spine noted.   [id number]; 15 additional minutes spent for Therapeutic exercises as stated in above notes.  This included exercises focusing on stretching, strengthening, with significant focus on eccentric aspects.   Long term goals include an improvement in range of motion, strength, endurance as well as avoiding reinjury. Patient's frequency would include in 1-2 times a day, 3-5 times a week for a duration of 6-12 weeks. Exercises that included:  Basic scapular stabilization to include adduction and depression of scapula Scaption, focusing on proper movement and good control Internal and External rotation utilizing a theraband, with elbow tucked at side entire time Rows with theraband   Proper technique shown and discussed handout in great detail with ATC.  All questions were discussed and answered.     Impression and Recommendations:    The above documentation has been reviewed and is accurate and complete Lyndal Pulley, DO

## 2021-10-05 NOTE — Telephone Encounter (Signed)
PT requesting refill Dexcom G6 transmitter be sent to [facility]. Also, in the future all prescription should be sent to [place] location.

## 2021-10-05 NOTE — Patient Instructions (Addendum)
Xray today Do prescribed exercises at least 3x a week Increase Cymbalta to 45m See you again in 6-8 weeks we can discuss PT

## 2021-10-05 NOTE — Assessment & Plan Note (Signed)
Patient is doing relatively well overall.  Discussed with patient though that it may be some increasing in discomfort recently.  Discussed the possibility of doing another facet injection but patient would like to hold at the moment.  We will increase patient's duloxetine to 30 mg.  I do believe that patient should do relatively well with it and has made some improvement at this point.  Patient continues to avoid the anti-inflammatories long-term secondary to patient's underlying cirrhosis.  Follow-up with me again 6 weeks to see how patient is doing.

## 2021-10-06 ENCOUNTER — Other Ambulatory Visit: Payer: Self-pay | Admitting: Internal Medicine

## 2021-10-06 MED ORDER — DEXCOM G6 TRANSMITTER MISC: 1.0000 | 3 refills | Status: DC

## 2021-10-06 NOTE — Telephone Encounter (Signed)
Script sent  

## 2021-10-08 ENCOUNTER — Other Ambulatory Visit: Payer: Self-pay | Admitting: Internal Medicine

## 2021-10-08 ENCOUNTER — Other Ambulatory Visit: Payer: Self-pay | Admitting: Family Medicine

## 2021-10-08 ENCOUNTER — Other Ambulatory Visit (HOSPITAL_COMMUNITY): Payer: Self-pay

## 2021-10-08 NOTE — Telephone Encounter (Signed)
Patient states that she will call when she needs refill sent to [facility]

## 2021-10-08 NOTE — Telephone Encounter (Signed)
Ran test claim. PA not needed. Product/Service Not Covered. Not covered under Part D Law. Plan prefers Accu-chek or True Metrix. Please send in new prescription for pt or provider choice of meter.

## 2021-10-11 ENCOUNTER — Ambulatory Visit: Payer: Medicare PPO | Admitting: Family Medicine

## 2021-10-20 ENCOUNTER — Ambulatory Visit: Payer: Medicare PPO | Admitting: Family Medicine

## 2021-10-20 VITALS — BP 140/80 | HR 110 | Temp 98.4°F | Wt 173.8 lb

## 2021-10-20 DIAGNOSIS — R5383 Other fatigue: Secondary | ICD-10-CM | POA: Diagnosis not present

## 2021-10-20 DIAGNOSIS — R400 Somnolence: Secondary | ICD-10-CM

## 2021-10-20 LAB — BASIC METABOLIC PANEL
BUN: 14 mg/dL (ref 6–23)
CO2: 25 mEq/L (ref 19–32)
Calcium: 9.8 mg/dL (ref 8.4–10.5)
Chloride: 87 mEq/L — ABNORMAL LOW (ref 96–112)
Creatinine, Ser: 0.7 mg/dL (ref 0.40–1.20)
GFR: 86.56 mL/min (ref >60.00)
Glucose, Bld: 148 mg/dL — ABNORMAL HIGH (ref 70–99)
Potassium: 3 mEq/L — ABNORMAL LOW (ref 3.5–5.1)
Sodium: 125 mEq/L — ABNORMAL LOW (ref 135–145)

## 2021-10-20 LAB — TSH: TSH: 2.5 u[IU]/mL (ref 0.35–5.50)

## 2021-10-20 LAB — VITAMIN B12: Vitamin B-12: 265 pg/mL (ref 211–911)

## 2021-10-20 NOTE — Progress Notes (Signed)
Established Patient Office Visit  Subjective:  Patient ID: Dana Dyer, female    DOB: August 25, 1951  Age: 70 y.o. MRN: [id number]  CC:  Chief Complaint  Patient presents with   Fatigue    Fatigue, sleeping often during the day, moving slowly, slurring words/unable to think of the right words, dizzy spells that come and go, thinks this may be a side effect of Nadol, dose was increased roughly 10-12 months ago, stopped taking nadolol this Monday to see if there was any change, family has notice a very slight improvment    HPI Dana Dyer presents accompanied by husband and daughter with some recent increased fatigue and daytime somnolence symptoms.  Her past medical history is significant for hypertension, obstructive sleep apnea, nonalcoholic cirrhosis, type 2 diabetes, degenerative arthritis, hyperlipidemia, recurrent depression.  She is followed by endocrinology regarding her diabetes and also followed by gastroenterology (for cirrhosis).  They relate at least 20-monthhistory of increased fatigue and daytime sleepiness.  She has had some difficulty with word finding recently.  No true slurred speech.  Occasional dizziness with standing.  Husband did some research and became concerned that these were side effects from nadolol.  They actually stopped her nadolol few days ago and they think that her symptoms have improved some.  She does have history of obstructive sleep apnea and has not been on CPAP in several years.  No recent observed apnea episodes.  No recent B12 or thyroid levels.  She is on chronic metformin.  Denies any recent progressive headache, focal weakness, chest pains, confusion, slurred speech  Past Medical History:  Diagnosis Date   ALLERGIC RHINITIS 10/09/2007   Allergy    Arthritis    hips, spine   DIABETES MELLITUS, UNCONTROLLED 03/16/2009   FATIGUE 10/21/2008   FIBROMYALGIA 10/09/2007   GERD (gastroesophageal reflux disease)    diet  controlled, no meds   H/O  vaginal hysterectomy    HYPERLIPIDEMIA 10/09/2007   diet controlled, no meds   HYPERTENSION 10/09/2007   Morton's neuroma    OBSTRUCTIVE SLEEP APNEA 10/09/2007   Plantar fasciitis    History - right   Sleep apnea    Uses CPAP   TRANSAMINASES, SERUM, ELEVATED 07/08/2009    Past Surgical History:  Procedure Laterality Date   ABDOMINAL HYSTERECTOMY  1979   prolapsed uterus   BASAL CELL CARCINOMA EXCISION     BREAST BIOPSY Left 2012   left 2012 stereo   BREAST REDUCTION SURGERY Bilateral 1992   COLONOSCOPY  2006   [facility]   mamoplasty     reduction   REDUCTION MAMMAPLASTY Bilateral 1992   TONSILLECTOMY  1959   TUBAL LIGATION  1976    Family History  Problem Relation Age of Onset   Diabetes Mother    Arthritis Mother    Cancer Mother        breast   Breast cancer Mother 751  Alcohol abuse Father    Arthritis Father    Hypertension Maternal Grandfather    Miscarriages / Stillbirths Paternal Grandfather    Breast cancer Maternal Aunt        diagnosed in her 763's  Colon polyps Neg Hx    Colon cancer Neg Hx    Esophageal cancer Neg Hx    Rectal cancer Neg Hx    Stomach cancer Neg Hx     Social History   Socioeconomic History   Marital status: Married    Spouse name: Not on file  Number of children: Not on file   Years of education: Not on file   Highest education level: Not on file  Occupational History   Not on file  Tobacco Use   Smoking status: Never   Smokeless tobacco: Never  Vaping Use   Vaping Use: Never used  Substance and Sexual Activity   Alcohol use: No    Alcohol/week: 0.0 standard drinks   Drug use: No   Sexual activity: Yes    Birth control/protection: Other-see comments, Post-menopausal    Comment: Hysterectomy  Other Topics Concern   Not on file  Social History Narrative   Not on file   Social Determinants of Health   Financial Resource Strain: Low Risk    Difficulty of Paying Living Expenses: Not hard at all  Food Insecurity: No  Food Insecurity   Worried About Charity fundraiser in the Last Year: Never true   Ran Out of Food in the Last Year: Never true  Transportation Needs: No Transportation Needs   Lack of Transportation (Medical): No   Lack of Transportation (Non-Medical): No  Physical Activity: Insufficiently Active   Days of Exercise per Week: 3 days   Minutes of Exercise per Session: 30 min  Stress: No Stress Concern Present   Feeling of Stress : Not at all  Social Connections: Unknown   Frequency of Communication with Friends and Family: Three times a week   Frequency of Social Gatherings with Friends and Family: Not on file   Attends Religious Services: Not on file   Active Member of Clubs or Organizations: No   Attends Archivist Meetings: Never   Marital Status: Married  Human resources officer Violence: Not At Risk   Fear of Current or Ex-Partner: No   Emotionally Abused: No   Physically Abused: No   Sexually Abused: No    Outpatient Medications Prior to Visit  Medication Sig Dispense Refill   amLODipine (NORVASC) 5 MG tablet TAKE 1 TABLET BY MOUTH EVERY DAY 90 tablet 0   BD PEN NEEDLE NANO 2ND GEN 32G X 4 MM MISC USE AS DIRECTED 400 each 3   betamethasone dipropionate 0.05 % cream      Continuous Blood Gluc Sensor (DEXCOM G6 SENSOR) MISC 1 Device by Does not apply route as directed. 3 each 11   Continuous Blood Gluc Transmit (DEXCOM G6 TRANSMITTER) MISC USE AS DIRECTED 1 each 3   DULoxetine (CYMBALTA) 30 MG capsule TAKE 1 CAPSULE BY MOUTH EVERY DAY 90 capsule 0   furosemide (LASIX) 20 MG tablet Take 1 tablet (20 mg total) by mouth daily. 30 tablet 11   hydrochlorothiazide (HYDRODIURIL) 25 MG tablet TAKE 1 TABLET BY MOUTH EVERY DAY 90 tablet 0   insulin glargine (LANTUS SOLOSTAR) 100 UNIT/ML Solostar Pen Inject 30 Units into the skin daily. 45 mL 3   insulin lispro (HUMALOG KWIKPEN) 200 UNIT/ML KwikPen Max Daily 120 30 mL 3   iron polysaccharides (NU-IRON) 150 MG capsule Take 1 capsule  (150 mg total) by mouth daily. 30 capsule 11   KLOR-CON M20 20 MEQ tablet TAKE 1 TABLET BY MOUTH EVERY DAY 90 tablet 1   losartan (COZAAR) 100 MG tablet TAKE 1 TABLET BY MOUTH EVERY DAY 90 tablet 1   metFORMIN (GLUCOPHAGE) 1000 MG tablet TAKE 1 TABLET BY MOUTH TWICE A DAY 180 tablet 3   MISC NATURAL PRODUCTS PO Take by mouth.      nadolol (CORGARD) 40 MG tablet TAKE 1 TABLET BY MOUTH EVERY DAY  90 tablet 1   NONFORMULARY OR COMPOUNDED ITEM Antifungal-terbinafine-3%, fluconazole -2%, tea tree oil-5%,urea- 10%,in DMSO suspension # 48m, apply to the affected nail(s) once (at bedtime) or twice daily. 1 each 0   ondansetron (ZOFRAN) 4 MG tablet Take 1 tablet (4 mg total) by mouth 2 (two) times daily as needed for nausea. 50 tablet 3   rosuvastatin (CRESTOR) 10 MG tablet Take 1 tablet (10 mg total) by mouth daily. 90 tablet 3   Facility-Administered Medications Prior to Visit  Medication Dose Route Frequency Provider Last Rate Last Admin   TDaP (BOOSTRIX) injection 0.5 mL  0.5 mL Intramuscular Once , BAlinda Sierras MD        Allergies  Allergen Reactions   Amoxicillin-Pot Clavulanate     Severe diarrhea   Codeine Sulfate     REACTION: "hyper"   Erythromycin Base    Invokana [Canagliflozin] Other (See Comments)    Recurrent yeast infections   Irbesartan-Hydrochlorothiazide    Latex    Victoza [Liraglutide] Nausea Only   Ozempic (0.25 Or 0.5 Mg-Dose) [Semaglutide(0.25 Or 0.531mDos)] Nausea Only    ROS Review of Systems  Constitutional:  Positive for fatigue. Negative for chills and unexpected weight change.  Respiratory:  Negative for cough and shortness of breath.   Cardiovascular:  Negative for chest pain.  Gastrointestinal:  Negative for abdominal pain.  Genitourinary:  Negative for dysuria.  Neurological:  Negative for dizziness, seizures and headaches.     Objective:    Physical Exam Constitutional:      Appearance: She is well-developed.  Eyes:     Extraocular  Movements: Extraocular movements intact.     Pupils: Pupils are equal, round, and reactive to light.  Neck:     Thyroid: No thyromegaly.     Vascular: No JVD.  Cardiovascular:     Comments: Regular rhythm but rate is slightly up around 100 Pulmonary:     Effort: Pulmonary effort is normal. No respiratory distress.     Breath sounds: Normal breath sounds. No wheezing or rales.  Musculoskeletal:     Cervical back: Neck supple.     Right lower leg: No edema.     Left lower leg: No edema.  Skin:    Comments: Normal sensory function with monofilament testing in both feet  Neurological:     General: No focal deficit present.     Mental Status: She is alert.     Cranial Nerves: No cranial nerve deficit.     Gait: Gait normal.    BP 140/80 (BP Location: Left Arm, Patient Position: Sitting, Cuff Size: Normal)   Pulse (!) 110   Temp 98.4 F (36.9 C) (Oral)   Wt 173 lb 12.8 oz (78.8 kg)   SpO2 98%   BMI 31.79 kg/m  Wt Readings from Last 3 Encounters:  10/20/21 173 lb 12.8 oz (78.8 kg)  10/05/21 175 lb (79.4 kg)  09/30/21 174 lb (78.9 kg)     Health Maintenance Due  Topic Date Due   Zoster Vaccines- Shingrix (1 of 2) Never done   FOOT EXAM  05/29/2020   OPHTHALMOLOGY EXAM  09/03/2021    There are no preventive care reminders to display for this patient.  Lab Results  Component Value Date   TSH 1.48 07/05/2019   Lab Results  Component Value Date   WBC 5.4 04/13/2021   HGB 10.4 (L) 04/13/2021   HCT 32.1 (L) 04/13/2021   MCV 73.0 (L) 04/13/2021   PLT 107.0 (L) 04/13/2021  Lab Results  Component Value Date   NA 130 (L) 05/11/2021   K 3.3 (L) 05/11/2021   CO2 30 05/11/2021   GLUCOSE 303 (H) 05/11/2021   BUN 10 05/11/2021   CREATININE 0.65 05/11/2021   BILITOT 2.0 (H) 04/13/2021   ALKPHOS 97 04/13/2021   AST 44 (H) 04/13/2021   ALT 35 04/13/2021   PROT 7.3 04/13/2021   ALBUMIN 3.7 04/13/2021   CALCIUM 9.7 05/11/2021   GFR 88.39 05/11/2021   Lab Results   Component Value Date   CHOL 154 09/30/2021   Lab Results  Component Value Date   HDL 59.30 09/30/2021   Lab Results  Component Value Date   LDLCALC 69 09/30/2021   Lab Results  Component Value Date   TRIG 128.0 09/30/2021   Lab Results  Component Value Date   CHOLHDL 3 09/30/2021   Lab Results  Component Value Date   HGBA1C 7.8 (A) 09/30/2021      Assessment & Plan:   Patient presents with several month history of progressive fatigue and daytime somnolence.  She has known obstructive sleep apnea and currently not on CPAP and that certainly could be contributing.  Husband is especially concerned whether her symptoms are related to nadolol and they do seem to have improved in terms of fatigue issues after they stopped her nadolol few days ago.  We suggested the following  -Check further labs including basic metabolic panel, TSH, J09 -We recommended they follow-up with GI regarding nadolol therapy. -Would consider follow-up with pulmonary regarding her obstructive sleep apnea to see if she can tolerate going back on CPAP.   No orders of the defined types were placed in this encounter.   Follow-up: No follow-ups on file.    Carolann Littler, MD

## 2021-10-21 ENCOUNTER — Other Ambulatory Visit: Payer: Self-pay

## 2021-10-21 DIAGNOSIS — E871 Hypo-osmolality and hyponatremia: Secondary | ICD-10-CM

## 2021-10-21 MED ORDER — POTASSIUM CHLORIDE ER 10 MEQ PO TBCR: 10.0000 meq | EXTENDED_RELEASE_TABLET | Freq: Every day | ORAL | 0 refills | Status: DC

## 2021-11-02 ENCOUNTER — Ambulatory Visit: Payer: Medicare PPO | Admitting: Podiatry

## 2021-11-05 DIAGNOSIS — E1165 Type 2 diabetes mellitus with hyperglycemia: Secondary | ICD-10-CM | POA: Diagnosis not present

## 2021-11-06 ENCOUNTER — Other Ambulatory Visit: Payer: Self-pay | Admitting: Family Medicine

## 2021-11-12 ENCOUNTER — Telehealth (INDEPENDENT_AMBULATORY_CARE_PROVIDER_SITE_OTHER): Payer: Medicare PPO | Admitting: Family Medicine

## 2021-11-12 ENCOUNTER — Other Ambulatory Visit: Payer: Self-pay

## 2021-11-12 DIAGNOSIS — E876 Hypokalemia: Secondary | ICD-10-CM | POA: Diagnosis not present

## 2021-11-12 DIAGNOSIS — R251 Tremor, unspecified: Secondary | ICD-10-CM | POA: Diagnosis not present

## 2021-11-12 DIAGNOSIS — I10 Essential (primary) hypertension: Secondary | ICD-10-CM | POA: Diagnosis not present

## 2021-11-12 DIAGNOSIS — R5383 Other fatigue: Secondary | ICD-10-CM | POA: Diagnosis not present

## 2021-11-12 DIAGNOSIS — E871 Hypo-osmolality and hyponatremia: Secondary | ICD-10-CM

## 2021-11-12 NOTE — Progress Notes (Signed)
Patient ID: Dana Dyer, female   DOB: [birth year], 70 y.o.   MRN: [id number]   This visit type was conducted due to national recommendations for restrictions regarding the COVID-19 pandemic in an effort to limit this patient's exposure and mitigate transmission in our community.   Virtual Visit via Video Note  I connected with Dana Dyer on 11/12/21 at 10:30 AM EST by a video enabled telemedicine application and verified that I am speaking with the correct person using two identifiers.  Location patient: home Location provider:work or home office Persons participating in the virtual visit: patient, provider  I discussed the limitations of evaluation and management by telemedicine and the availability of in person appointments. The patient expressed understanding and agreed to proceed.   HPI: Dana Dyer had visit on 16 November accompanied by daughter and husband.  She had complained of extreme fatigue and daytime somnolence.  Question of difficulty word finding.  No true slurred speech.  Husband had done some research and became concerned that her symptoms were related to nadolol.  They held the nadolol on their own and patient states she feels about 95% better.  We had also obtained some screening labs and her thyroid came back normal.  B12 265.  Sodium 125 and potassium 3.0.  We had her hold her HCTZ and started oral potassium replacement  She states she is feeling much better and basically back to baseline.  She has not been regularly monitoring her blood pressure.  She does have history of essential tremor and states that her tremor is worse currently and she wonders if this is related to recent increased dose of Cymbalta.  As above though she also did recently discontinue nadolol.  Her GI physician is aware and they have follow-up with her later in January to reassess.  She is on the nadolol because of her nonalcoholic cirrhosis   ROS: See pertinent positives and negatives per  HPI.  Past Medical History:  Diagnosis Date   ALLERGIC RHINITIS 10/09/2007   Allergy    Arthritis    hips, spine   DIABETES MELLITUS, UNCONTROLLED 03/16/2009   FATIGUE 10/21/2008   FIBROMYALGIA 10/09/2007   GERD (gastroesophageal reflux disease)    diet  controlled, no meds   H/O vaginal hysterectomy    HYPERLIPIDEMIA 10/09/2007   diet controlled, no meds   HYPERTENSION 10/09/2007   Morton's neuroma    OBSTRUCTIVE SLEEP APNEA 10/09/2007   Plantar fasciitis    History - right   Sleep apnea    Uses CPAP   TRANSAMINASES, SERUM, ELEVATED 07/08/2009    Past Surgical History:  Procedure Laterality Date   ABDOMINAL HYSTERECTOMY  1979   prolapsed uterus   BASAL CELL CARCINOMA EXCISION     BREAST BIOPSY Left 2012   left 2012 stereo   BREAST REDUCTION SURGERY Bilateral 1992   COLONOSCOPY  2006   [facility]   mamoplasty     reduction   REDUCTION MAMMAPLASTY Bilateral 1992   TONSILLECTOMY  1959   TUBAL LIGATION  1976    Family History  Problem Relation Age of Onset   Diabetes Mother    Arthritis Mother    Cancer Mother        breast   Breast cancer Mother 47   Alcohol abuse Father    Arthritis Father    Hypertension Maternal Grandfather    Miscarriages / Stillbirths Paternal Grandfather    Breast cancer Maternal Aunt        diagnosed in her 61's  Colon polyps Neg Hx    Colon cancer Neg Hx    Esophageal cancer Neg Hx    Rectal cancer Neg Hx    Stomach cancer Neg Hx     SOCIAL HX: Non-smoker   Current Outpatient Medications:    amLODipine (NORVASC) 5 MG tablet, TAKE 1 TABLET BY MOUTH EVERY DAY, Disp: 90 tablet, Rfl: 0   BD PEN NEEDLE NANO 2ND GEN 32G X 4 MM MISC, USE AS DIRECTED, Disp: 400 each, Rfl: 3   betamethasone dipropionate 0.05 % cream, , Disp: , Rfl:    Continuous Blood Gluc Sensor (DEXCOM G6 SENSOR) MISC, 1 Device by Does not apply route as directed., Disp: 3 each, Rfl: 11   Continuous Blood Gluc Transmit (DEXCOM G6 TRANSMITTER) MISC, USE AS DIRECTED, Disp: 1  each, Rfl: 3   DULoxetine (CYMBALTA) 30 MG capsule, TAKE 1 CAPSULE BY MOUTH EVERY DAY, Disp: 90 capsule, Rfl: 0   furosemide (LASIX) 20 MG tablet, Take 1 tablet (20 mg total) by mouth daily., Disp: 30 tablet, Rfl: 11   insulin glargine (LANTUS SOLOSTAR) 100 UNIT/ML Solostar Pen, Inject 30 Units into the skin daily., Disp: 45 mL, Rfl: 3   insulin lispro (HUMALOG KWIKPEN) 200 UNIT/ML KwikPen, Max Daily 120, Disp: 30 mL, Rfl: 3   iron polysaccharides (NU-IRON) 150 MG capsule, Take 1 capsule (150 mg total) by mouth daily., Disp: 30 capsule, Rfl: 11   KLOR-CON M20 20 MEQ tablet, TAKE 1 TABLET BY MOUTH EVERY DAY, Disp: 90 tablet, Rfl: 1   losartan (COZAAR) 100 MG tablet, TAKE 1 TABLET BY MOUTH EVERY DAY, Disp: 90 tablet, Rfl: 1   metFORMIN (GLUCOPHAGE) 1000 MG tablet, TAKE 1 TABLET BY MOUTH TWICE A DAY, Disp: 180 tablet, Rfl: 3   MISC NATURAL PRODUCTS PO, Take by mouth. , Disp: , Rfl:    nadolol (CORGARD) 40 MG tablet, TAKE 1 TABLET BY MOUTH EVERY DAY, Disp: 90 tablet, Rfl: 1   NONFORMULARY OR COMPOUNDED ITEM, Antifungal-terbinafine-3%, fluconazole -2%, tea tree oil-5%,urea- 10%,in DMSO suspension # 47m, apply to the affected nail(s) once (at bedtime) or twice daily., Disp: 1 each, Rfl: 0   ondansetron (ZOFRAN) 4 MG tablet, Take 1 tablet (4 mg total) by mouth 2 (two) times daily as needed for nausea., Disp: 50 tablet, Rfl: 3   rosuvastatin (CRESTOR) 10 MG tablet, Take 1 tablet (10 mg total) by mouth daily., Disp: 90 tablet, Rfl: 3  Current Facility-Administered Medications:    TDaP (BOOSTRIX) injection 0.5 mL, 0.5 mL, Intramuscular, Once, , BAlinda Sierras MD  EXAM:  VITALS per patient if applicable:  GENERAL: alert, oriented, appears well and in no acute distress  HEENT: atraumatic, conjunttiva clear, no obvious abnormalities on inspection of external nose and ears  NECK: normal movements of the head and neck  LUNGS: on inspection no signs of respiratory distress, breathing rate appears  normal, no obvious gross SOB, gasping or wheezing  CV: no obvious cyanosis  MS: moves all visible extremities without noticeable abnormality  PSYCH/NEURO: pleasant and cooperative, no obvious depression or anxiety, speech and thought processing grossly intact  ASSESSMENT AND PLAN:  Discussed the following assessment and plan:  #1 recent extreme fatigue currently improved.  May have been related to beta-blocker but also certainly could have been contributed to by her low potassium and sodium as well.  In any event, she is dramatically improved at this time.  She will discuss with GI potential alternatives to nadolol  #2 electrolyte disturbance with hyponatremia and hypokalemia.  Probably  related to HCTZ.  She is now off HCTZ and on potassium replacement.  Future order for basic metabolic panel is already been placed and she will schedule this for Monday or Tuesday next week  #3 hypertension.  We will need to monitor this closely with her coming off nadolol and HCTZ.  We have asked that she check this several times per week at home for now.  Also recommend office check for blood pressure within the next week or 2  #4 essential tremor.  Patient concerned this is worse.  We explained this may be partly related to coming off nadolol but also potentially worse from increased Cymbalta because of the norepinephrine component     I discussed the assessment and treatment plan with the patient. The patient was provided an opportunity to ask questions and all were answered. The patient agreed with the plan and demonstrated an understanding of the instructions.   The patient was advised to call back or seek an in-person evaluation if the symptoms worsen or if the condition fails to improve as anticipated.     Carolann Littler, MD

## 2021-11-15 ENCOUNTER — Telehealth: Payer: Self-pay | Admitting: Family Medicine

## 2021-11-15 MED ORDER — DULOXETINE HCL 20 MG PO CPEP
ORAL_CAPSULE | ORAL | 0 refills | Status: DC
Start: 2021-11-15 — End: 2021-12-24

## 2021-11-15 NOTE — Telephone Encounter (Signed)
Thank you, Dr Georgina Snell! Spoke to patient and informed.  Just FYI for Dr Tamala Julian.

## 2021-11-15 NOTE — Telephone Encounter (Signed)
Patient called in regards to her recently increased Cymbalta.  She said that since the increase her "non essential tremors" have gotten worse. It has traveled up her arm and is now causing her mouth to tremor and getting worse. She spoke to her PCP and he thinks the increased tremors could be coming from the norepinephrine. She asked if she should decrease the dose or what would be best.  Could you advise in Dr Thompson Caul absence?

## 2021-11-15 NOTE — Telephone Encounter (Signed)
I think it is reasonable to decrease the Cymbalta to 20 mg daily.  New prescription sent to [facility]

## 2021-11-16 ENCOUNTER — Other Ambulatory Visit (INDEPENDENT_AMBULATORY_CARE_PROVIDER_SITE_OTHER): Payer: Medicare PPO

## 2021-11-16 DIAGNOSIS — E871 Hypo-osmolality and hyponatremia: Secondary | ICD-10-CM

## 2021-11-17 ENCOUNTER — Other Ambulatory Visit: Payer: Self-pay | Admitting: Family Medicine

## 2021-11-17 LAB — BASIC METABOLIC PANEL
BUN: 11 mg/dL (ref 6–23)
CO2: 26 mEq/L (ref 19–32)
Calcium: 9.5 mg/dL (ref 8.4–10.5)
Chloride: 101 mEq/L (ref 96–112)
Creatinine, Ser: 0.66 mg/dL (ref 0.40–1.20)
GFR: 87.74 mL/min (ref >60.00)
Glucose, Bld: 132 mg/dL — ABNORMAL HIGH (ref 70–99)
Potassium: 4.1 mEq/L (ref 3.5–5.1)
Sodium: 136 mEq/L (ref 135–145)

## 2021-11-18 ENCOUNTER — Other Ambulatory Visit: Payer: Self-pay | Admitting: Family Medicine

## 2021-11-18 ENCOUNTER — Ambulatory Visit: Payer: Medicare PPO | Admitting: Dietician

## 2021-11-18 ENCOUNTER — Other Ambulatory Visit: Payer: Self-pay | Admitting: Gastroenterology

## 2021-11-19 NOTE — Telephone Encounter (Signed)
Please advise. Last filled by another provider.

## 2021-11-23 ENCOUNTER — Ambulatory Visit: Payer: Medicare PPO | Admitting: Gastroenterology

## 2021-11-23 ENCOUNTER — Encounter: Payer: Self-pay | Admitting: Gastroenterology

## 2021-11-23 ENCOUNTER — Other Ambulatory Visit (INDEPENDENT_AMBULATORY_CARE_PROVIDER_SITE_OTHER): Payer: Medicare PPO

## 2021-11-23 VITALS — BP 140/74 | HR 105 | Ht 62.0 in | Wt 181.0 lb

## 2021-11-23 DIAGNOSIS — K7460 Unspecified cirrhosis of liver: Secondary | ICD-10-CM | POA: Diagnosis not present

## 2021-11-23 LAB — BASIC METABOLIC PANEL
BUN: 9 mg/dL (ref 6–23)
CO2: 26 mEq/L (ref 19–32)
Calcium: 9.9 mg/dL (ref 8.4–10.5)
Chloride: 102 mEq/L (ref 96–112)
Creatinine, Ser: 0.57 mg/dL (ref 0.40–1.20)
GFR: 90.89 mL/min (ref >60.00)
Glucose, Bld: 76 mg/dL (ref 70–99)
Potassium: 4 mEq/L (ref 3.5–5.1)
Sodium: 138 mEq/L (ref 135–145)

## 2021-11-23 LAB — COMPREHENSIVE METABOLIC PANEL
ALT: 32 U/L (ref 0–35)
AST: 41 U/L — ABNORMAL HIGH (ref 0–37)
Albumin: 3.7 g/dL (ref 3.5–5.2)
Alkaline Phosphatase: 75 U/L (ref 39–117)
BUN: 9 mg/dL (ref 6–23)
CO2: 26 mEq/L (ref 19–32)
Calcium: 9.9 mg/dL (ref 8.4–10.5)
Chloride: 102 mEq/L (ref 96–112)
Creatinine, Ser: 0.57 mg/dL (ref 0.40–1.20)
GFR: 90.89 mL/min (ref >60.00)
Glucose, Bld: 76 mg/dL (ref 70–99)
Potassium: 4 mEq/L (ref 3.5–5.1)
Sodium: 138 mEq/L (ref 135–145)
Total Bilirubin: 2.1 mg/dL — ABNORMAL HIGH (ref 0.2–1.2)
Total Protein: 7.3 g/dL (ref 6.0–8.3)

## 2021-11-23 LAB — CBC
HCT: 31.4 % — ABNORMAL LOW (ref 36.0–46.0)
Hemoglobin: 9.9 g/dL — ABNORMAL LOW (ref 12.0–15.0)
MCHC: 31.5 g/dL (ref 30.0–36.0)
MCV: 75.1 fl — ABNORMAL LOW (ref 78.0–100.0)
Platelets: 89 10*3/uL — ABNORMAL LOW (ref 150.0–400.0)
RBC: 4.18 Mil/uL (ref 3.87–5.11)
RDW: 17.6 % — ABNORMAL HIGH (ref 11.5–15.5)
WBC: 4.3 10*3/uL (ref 4.0–10.5)

## 2021-11-23 LAB — PROTIME-INR
INR: 1.3 ratio — ABNORMAL HIGH (ref 0.8–1.0)
Prothrombin Time: 13.7 s — ABNORMAL HIGH (ref 9.6–13.1)

## 2021-11-23 LAB — AMMONIA: Ammonia: 65 umol/L — ABNORMAL HIGH (ref 11–35)

## 2021-11-23 LAB — IRON: Iron: 34 ug/dL — ABNORMAL LOW (ref 42–145)

## 2021-11-23 MED ORDER — SPIRONOLACTONE 50 MG PO TABS: 50.0000 mg | ORAL_TABLET | Freq: Every day | ORAL | 11 refills | Status: DC

## 2021-11-23 MED ORDER — LACTULOSE 10 GM/15ML PO SOLN
20.0000 g | Freq: Every day | ORAL | 11 refills | Status: AC
Start: 2021-11-23 — End: 2021-12-23

## 2021-11-23 NOTE — Patient Instructions (Addendum)
If you are age 72 or older, your body mass index should be between 23-30. Your Body mass index is 33.11 kg/m. If this is out of the aforementioned range listed, please consider follow up with your Primary Care Provider. ________________________________________________________  The [facility] providers would like to encourage you to use [facility] to communicate with providers for non-urgent requests or questions.  Due to long hold times on the telephone, sending your provider a message by [facility] may be a faster and more efficient way to get a response.  Please allow 48 business hours for a response.  Please remember that this is for non-urgent requests.  _______________________________________________________  Your provider has requested that you go to the basement level for lab work before leaving today. Press "B" on the elevator. The lab is located at the first door on the left as you exit the elevator.  You have been scheduled for an abdominal ultrasound at [facility] (1st floor of hospital) on 12-09-21 at 8:30am. Please arrive 26mnutes prior to your appointment for registration. Make certain not to have anything to eat or drink 6 hours prior to your appointment. Should you need to reschedule your appointment, please contact radiology at [phone] This test typically takes about 30 minutes to perform.  Your provider has requested that you go to the basement level for lab work in 2 weeks (12-07-21). Press "B" on the elevator. The lab is located at the first door on the left as you exit the elevator.  You are scheduled to follow up in our office on 12-24-21 at 11:10am.  We have sent the following medications to your pharmacy for you to pick up at your convenience:  START: Aldactone 571mone tablet daily  START: Lactulose 30 cc daily  DISCONTINUE: over-the-counter stool softener   Please call our office back with the correct dosage of potassium that you are currently taking.  Thank  you for entrusting me with your care and choosing [facility] Dr JaArdis Hughs

## 2021-11-23 NOTE — Progress Notes (Signed)
Review of pertinent gastrointestinal problems: 1.  History of adenomatous polyps in her colon.  Colonoscopy 2006 Dr. Timmothy Euler found no polyps.  Colonoscopy Dr. Ardis Hughs December 2016 found 7 subcentimeter polyps.  These were all adenomatous polyps on pathology.  Also documented left-sided diverticulosis.  I recommend she have a repeat colonoscopy at 3-year interval.   Colonoscopy January 2020 four polyps were removed from her colon, all were adenomatous on pathology.  She was recommended to have repeat colonoscopy at 3-year interval 2. Cirrhosis, likely from longstanding fatty liver disease, out-of-control diabetes.  Diagnosed 2019.  Lab workup 2019: Hep C Ab negative, Hep B Surface Ag negative, hepatitis B surface antibody negative, hep B Core Igm negative, Hep A IgM negative. Ferritin normal, HIV negative, ANA negative, antimitochondrial antibody negative, hepatitis a antibody total negative; slightly low platelets, transaminases slightly elevated.  No edema. [place] 07/2018: slightly nodular, suspicious for cirrhosis. [place] 12/2019 cirrhosis without mass lesions.  Ultrasound 08/2020 showed cirrhosis without focal mass lesions.  Gallbladder thick but without Murphy sign.  Ultrasound May 2022 showed cirrhosis, trace perihepatic ascites, no focal lesions in the liver. EGD 12/2018 small distal esophagus varices, mild portal gastropathy, no gastric varices.  She was started on nadolol (eventually at 65m daily) AFP 04/2021 4.8 (normal) MELD 11 (04/2021 labs) Hepatitis A, B vaccination series started 11/2018    HPI: This is a very pleasant 70year old woman   I last saw her here in the office about 7 months ago for cirrhosis general follow-up.  She had some swelling in her ankles and a recent ultrasound suggested trace ascites and so I started her on a low-dose diuretic 20 mg pills Lasix once daily.  Repeat basic metabolic profile shortly after starting showed that her creatinine was fine, her ankle swelling was much  better.  Her potassium was slightly low and so I started her on a potassium supplement K. Dur 20 mEq once daily.  Her weight today is exactly the same as it was 7 months ago.  Basic metabolic profile last week showed normal creatinine, normal potassium.  Today she tells me she is not sure if she is on 1 potassium supplement or 2.  Currently on her medicine list is Klor-Con 20 mEq 1 pill once daily.  She has been having some difficulty with her memory, some confusion, stumbling a bit.  Sounds like a tremor might be worse.  She moves her bowels once daily only if she takes a stool softener.  She is having no overt GI bleeding.  Swelling in her ankles got better and then it is worse again now.  ROS: complete GI ROS as described in HPI, all other review negative.  Constitutional:  No unintentional weight loss   Past Medical History:  Diagnosis Date   ALLERGIC RHINITIS 10/09/2007   Allergy    Arthritis    hips, spine   DIABETES MELLITUS, UNCONTROLLED 03/16/2009   FATIGUE 10/21/2008   FIBROMYALGIA 10/09/2007   GERD (gastroesophageal reflux disease)    diet  controlled, no meds   H/O vaginal hysterectomy    HYPERLIPIDEMIA 10/09/2007   diet controlled, no meds   HYPERTENSION 10/09/2007   Morton's neuroma    OBSTRUCTIVE SLEEP APNEA 10/09/2007   Plantar fasciitis    History - right   Sleep apnea    Uses CPAP   TRANSAMINASES, SERUM, ELEVATED 07/08/2009    Past Surgical History:  Procedure Laterality Date   ABDOMINAL HYSTERECTOMY  1979   prolapsed uterus   BASAL CELL CARCINOMA EXCISION  BREAST BIOPSY Left 2012   left 2012 stereo   BREAST REDUCTION SURGERY Bilateral 1992   COLONOSCOPY  2006   [facility]   mamoplasty     reduction   REDUCTION MAMMAPLASTY Bilateral 1992   TONSILLECTOMY  1959   TUBAL LIGATION  1976    Current Outpatient Medications  Medication Sig Dispense Refill   amLODipine (NORVASC) 5 MG tablet TAKE 1 TABLET BY MOUTH EVERY DAY 90 tablet 0   BD PEN NEEDLE  NANO 2ND GEN 32G X 4 MM MISC USE AS DIRECTED 400 each 3   betamethasone dipropionate 0.05 % cream      Continuous Blood Gluc Sensor (DEXCOM G6 SENSOR) MISC 1 Device by Does not apply route as directed. 3 each 11   Continuous Blood Gluc Transmit (DEXCOM G6 TRANSMITTER) MISC USE AS DIRECTED 1 each 3   DULoxetine (CYMBALTA) 20 MG capsule TAKE 1 CAPSULE BY MOUTH EVERY DAY (Patient taking differently: 20 mg every other day. TAKE 1 CAPSULE BY MOUTH EVERY DAY) 90 capsule 0   furosemide (LASIX) 20 MG tablet Take 1 tablet (20 mg total) by mouth daily. 30 tablet 11   insulin glargine (LANTUS SOLOSTAR) 100 UNIT/ML Solostar Pen Inject 30 Units into the skin daily. 45 mL 3   insulin lispro (HUMALOG KWIKPEN) 200 UNIT/ML KwikPen Max Daily 120 30 mL 3   KLOR-CON M20 20 MEQ tablet TAKE 1 TABLET BY MOUTH EVERY DAY 90 tablet 1   losartan (COZAAR) 100 MG tablet TAKE 1 TABLET BY MOUTH EVERY DAY 90 tablet 1   metFORMIN (GLUCOPHAGE) 1000 MG tablet TAKE 1 TABLET BY MOUTH TWICE A DAY 180 tablet 3   NONFORMULARY OR COMPOUNDED ITEM Antifungal-terbinafine-3%, fluconazole -2%, tea tree oil-5%,urea- 10%,in DMSO suspension # 55m, apply to the affected nail(s) once (at bedtime) or twice daily. 1 each 0   ondansetron (ZOFRAN) 4 MG tablet Take 1 tablet (4 mg total) by mouth 2 (two) times daily as needed for nausea. 50 tablet 3   rosuvastatin (CRESTOR) 10 MG tablet Take 1 tablet (10 mg total) by mouth daily. 90 tablet 3   MISC NATURAL PRODUCTS PO Take by mouth.  (Patient not taking: Reported on 11/23/2021)     Current Facility-Administered Medications  Medication Dose Route Frequency Provider Last Rate Last Admin   TDaP (BOOSTRIX) injection 0.5 mL  0.5 mL Intramuscular Once BEulas Post MD        Allergies as of 11/23/2021 - Review Complete 11/23/2021  Allergen Reaction Noted   Amoxicillin-pot clavulanate  03/09/2012   Codeine sulfate  03/16/2009   Erythromycin base  04/07/2009   Invokana [canagliflozin] Other (See  Comments) 05/18/2015   Irbesartan-hydrochlorothiazide  04/07/2009   Latex     Victoza [liraglutide] Nausea Only 03/10/2016   Ozempic (0.25 or 0.5 mg-dose) [semaglutide(0.25 or 0.528mdos)] Nausea Only 09/09/2019    Family History  Problem Relation Age of Onset   Diabetes Mother    Arthritis Mother    Cancer Mother        breast   Breast cancer Mother [id number] Alcohol abuse Father    Arthritis Father    Hypertension Maternal Grandfather    Miscarriages / Stillbirths Paternal Grandfather    Breast cancer Maternal Aunt        diagnosed in her 7052's Colon polyps Neg Hx    Colon cancer Neg Hx    Esophageal cancer Neg Hx    Rectal cancer Neg Hx    Stomach cancer Neg Hx  Social History   Socioeconomic History   Marital status: Married    Spouse name: Not on file   Number of children: Not on file   Years of education: Not on file   Highest education level: Not on file  Occupational History   Not on file  Tobacco Use   Smoking status: Never   Smokeless tobacco: Never  Vaping Use   Vaping Use: Never used  Substance and Sexual Activity   Alcohol use: No    Alcohol/week: 0.0 standard drinks   Drug use: No   Sexual activity: Yes    Birth control/protection: Other-see comments, Post-menopausal    Comment: Hysterectomy  Other Topics Concern   Not on file  Social History Narrative   Not on file   Social Determinants of Health   Financial Resource Strain: Low Risk    Difficulty of Paying Living Expenses: Not hard at all  Food Insecurity: No Food Insecurity   Worried About Charity fundraiser in the Last Year: Never true   [place] in the Last Year: Never true  Transportation Needs: No Transportation Needs   Lack of Transportation (Medical): No   Lack of Transportation (Non-Medical): No  Physical Activity: Insufficiently Active   Days of Exercise per Week: 3 days   Minutes of Exercise per Session: 30 min  Stress: No Stress Concern Present   Feeling of Stress :  Not at all  Social Connections: Unknown   Frequency of Communication with Friends and Family: Three times a week   Frequency of Social Gatherings with Friends and Family: Not on file   Attends Religious Services: Not on file   Active Member of Clubs or Organizations: No   Attends Archivist Meetings: Never   Marital Status: Married  Human resources officer Violence: Not At Risk   Fear of Current or Ex-Partner: No   Emotionally Abused: No   Physically Abused: No   Sexually Abused: No     Physical Exam: BP 140/74    Pulse (!) 105    Ht 5' 2"  (1.575 m)    Wt 181 lb (82.1 kg)    BMI 33.11 kg/m  Constitutional: generally well-appearing Psychiatric: alert and oriented x3 Abdomen: soft, nontender, nondistended, no obvious ascites, no peritoneal signs, normal bowel sounds Trace to 1+ peripheral edema noted in lower extremities bilaterally  Assessment and plan: 70 y.o. female with Karlene Lineman related cirrhosis  I am concerned that she is having some mild decompensation with possible mild encephalopathy.  Her fluid status is getting a bit difficult to control with her potassium levels.  She thinks she might be on 2 potassium supplements and we only have one listed here on her med list.  I told her when she gets home she needs to call us back to let us know exactly what she is taking in terms of potassium.  She will continue Lasix 20 mg once daily.  I am going to start Aldactone for her as well 50 mg once daily.  Because of this she will need a repeat basic metabolic profile in 2 weeks  We are going to get a baseline set of liver labs to restage her disease and get alpha-fetoprotein for hepatoma screening as well as a ultrasound.  She will get a CBC, complete metabolic profile and coags.  I am starting her on lactulose 30 cc once daily for what might be mild encephalopathy symptoms.  She was also get an ammonia level today.  I  asked her to stop taking the stool softener which she was taking  over-the-counter once daily basis.  She will return to see me in 6 weeks.  Please see the "Patient Instructions" section for addition details about the plan.  Owens Loffler, MD [facility] 11/23/2021, 10:26 AM   Total time on date of encounter was 40 minutes (this included time spent preparing to see the patient reviewing records; obtaining and/or reviewing separately obtained history; performing a medically appropriate exam and/or evaluation; counseling and educating the patient and family if present; ordering medications, tests or procedures if applicable; and documenting clinical information in the health record).

## 2021-11-24 ENCOUNTER — Other Ambulatory Visit: Payer: Self-pay

## 2021-11-24 ENCOUNTER — Encounter: Payer: Self-pay | Admitting: Family Medicine

## 2021-11-24 DIAGNOSIS — E876 Hypokalemia: Secondary | ICD-10-CM

## 2021-11-24 DIAGNOSIS — K7460 Unspecified cirrhosis of liver: Secondary | ICD-10-CM

## 2021-11-24 LAB — AFP TUMOR MARKER: AFP-Tumor Marker: 5.7 ng/mL

## 2021-11-24 NOTE — Progress Notes (Signed)
Entered in error

## 2021-12-07 ENCOUNTER — Other Ambulatory Visit (INDEPENDENT_AMBULATORY_CARE_PROVIDER_SITE_OTHER): Payer: Medicare PPO

## 2021-12-07 DIAGNOSIS — K7460 Unspecified cirrhosis of liver: Secondary | ICD-10-CM

## 2021-12-07 DIAGNOSIS — E876 Hypokalemia: Secondary | ICD-10-CM | POA: Diagnosis not present

## 2021-12-07 LAB — BASIC METABOLIC PANEL
BUN: 10 mg/dL (ref 6–23)
CO2: 25 mEq/L (ref 19–32)
Calcium: 9 mg/dL (ref 8.4–10.5)
Chloride: 100 mEq/L (ref 96–112)
Creatinine, Ser: 0.84 mg/dL (ref 0.40–1.20)
GFR: 69.48 mL/min (ref >60.00)
Glucose, Bld: 276 mg/dL — ABNORMAL HIGH (ref 70–99)
Potassium: 3.3 mEq/L — ABNORMAL LOW (ref 3.5–5.1)
Sodium: 135 mEq/L (ref 135–145)

## 2021-12-09 ENCOUNTER — Other Ambulatory Visit: Payer: Self-pay

## 2021-12-09 ENCOUNTER — Ambulatory Visit (HOSPITAL_COMMUNITY)
Admission: RE | Admit: 2021-12-09 | Discharge: 2021-12-09 | Disposition: A | Discharge status: Home / Self Care | Payer: Medicare PPO | Source: Ambulatory Visit | Attending: Gastroenterology | Admitting: Gastroenterology

## 2021-12-09 ENCOUNTER — Ambulatory Visit: Payer: Medicare PPO | Admitting: Family Medicine

## 2021-12-09 DIAGNOSIS — K7460 Unspecified cirrhosis of liver: Secondary | ICD-10-CM | POA: Insufficient documentation

## 2021-12-09 DIAGNOSIS — R188 Other ascites: Secondary | ICD-10-CM | POA: Diagnosis not present

## 2021-12-10 ENCOUNTER — Encounter: Payer: Self-pay | Admitting: Family Medicine

## 2021-12-10 ENCOUNTER — Telehealth (INDEPENDENT_AMBULATORY_CARE_PROVIDER_SITE_OTHER): Payer: Medicare PPO | Admitting: Family Medicine

## 2021-12-10 DIAGNOSIS — K7460 Unspecified cirrhosis of liver: Secondary | ICD-10-CM | POA: Diagnosis not present

## 2021-12-10 DIAGNOSIS — R040 Epistaxis: Secondary | ICD-10-CM | POA: Diagnosis not present

## 2021-12-10 DIAGNOSIS — J0100 Acute maxillary sinusitis, unspecified: Secondary | ICD-10-CM | POA: Diagnosis not present

## 2021-12-10 MED ORDER — DOXYCYCLINE HYCLATE 100 MG PO TABS: 100.0000 mg | ORAL_TABLET | Freq: Two times a day (BID) | ORAL | 0 refills | Status: AC

## 2021-12-10 MED ORDER — BENZONATATE 100 MG PO CAPS: 100.0000 mg | ORAL_CAPSULE | Freq: Three times a day (TID) | ORAL | 0 refills | Status: DC | PRN

## 2021-12-10 NOTE — Progress Notes (Signed)
MyChart Video Visit    Virtual Visit via Video Note   This visit type was conducted due to national recommendations for restrictions regarding the COVID-19 Pandemic (e.g. social distancing) in an effort to limit this patient's exposure and mitigate transmission in our community. This patient is at least at moderate risk for complications without adequate follow up. This format is felt to be most appropriate for this patient at this time. Physical exam was limited by quality of the video and audio technology used for the visit. CMA was able to get the patient set up on a video visit.  Patient location: Home. Patient and provider in visit Provider location: Office  I discussed the limitations of evaluation and management by telemedicine and the availability of in person appointments. The patient expressed understanding and agreed to proceed.  Visit Date: 12/10/2021  Today's healthcare provider: Wellington Hampshire, MD     Subjective:    Patient ID: Dana Dyer, female    DOB: 07/20/1951, 71 y.o.   MRN: [id number]  Chief Complaint  Patient presents with   Epistaxis    Frequent nose bleeds x 3 weeks Started off as sinus issues, but has progressed    Cough    HPI 3 wks congestion, nose bleeds.  Had similar at Thanksgiving.  A lot of mucus in nose and then bleeds when blows.  Coughing a lot.-used husb old codeine cough syrup and helped. No f/c. No sob. No inc swelling Coughs so much can vomit.  Past Medical History:  Diagnosis Date   ALLERGIC RHINITIS 10/09/2007   Allergy    Arthritis    hips, spine   DIABETES MELLITUS, UNCONTROLLED 03/16/2009   FATIGUE 10/21/2008   FIBROMYALGIA 10/09/2007   GERD (gastroesophageal reflux disease)    diet  controlled, no meds   H/O vaginal hysterectomy    HYPERLIPIDEMIA 10/09/2007   diet controlled, no meds   HYPERTENSION 10/09/2007   Morton's neuroma    OBSTRUCTIVE SLEEP APNEA 10/09/2007   Plantar fasciitis    History - right   Sleep  apnea    Uses CPAP   TRANSAMINASES, SERUM, ELEVATED 07/08/2009    Past Surgical History:  Procedure Laterality Date   ABDOMINAL HYSTERECTOMY  1979   prolapsed uterus   BASAL CELL CARCINOMA EXCISION     BREAST BIOPSY Left 2012   left 2012 stereo   BREAST REDUCTION SURGERY Bilateral 1992   COLONOSCOPY  2006   [facility]   mamoplasty     reduction   REDUCTION MAMMAPLASTY Bilateral [place]    Outpatient Medications Prior to Visit  Medication Sig Dispense Refill   amLODipine (NORVASC) 5 MG tablet TAKE 1 TABLET BY MOUTH EVERY DAY 90 tablet 0   BD PEN NEEDLE NANO 2ND GEN 32G X 4 MM MISC USE AS DIRECTED 400 each 3   betamethasone dipropionate 0.05 % cream      Continuous Blood Gluc Sensor (DEXCOM G6 SENSOR) MISC 1 Device by Does not apply route as directed. 3 each 11   Continuous Blood Gluc Transmit (DEXCOM G6 TRANSMITTER) MISC USE AS DIRECTED 1 each 3   DULoxetine (CYMBALTA) 20 MG capsule TAKE 1 CAPSULE BY MOUTH EVERY DAY (Patient taking differently: 20 mg every other day. TAKE 1 CAPSULE BY MOUTH EVERY DAY) 90 capsule 0   furosemide (LASIX) 20 MG tablet Take 1 tablet (20 mg total) by mouth daily. 30 tablet 11   insulin glargine (LANTUS  SOLOSTAR) 100 UNIT/ML Solostar Pen Inject 30 Units into the skin daily. 45 mL 3   insulin lispro (HUMALOG KWIKPEN) 200 UNIT/ML KwikPen Max Daily 120 30 mL 3   KLOR-CON M20 20 MEQ tablet TAKE 1 TABLET BY MOUTH EVERY DAY 90 tablet 1   lactulose (CHRONULAC) 10 GM/15ML solution Take 30 mLs (20 g total) by mouth daily. 900 mL 11   metFORMIN (GLUCOPHAGE) 1000 MG tablet TAKE 1 TABLET BY MOUTH TWICE A DAY 180 tablet 3   MISC NATURAL PRODUCTS PO Take by mouth.     NONFORMULARY OR COMPOUNDED ITEM Antifungal-terbinafine-3%, fluconazole -2%, tea tree oil-5%,urea- 10%,in DMSO suspension # 23m, apply to the affected nail(s) once (at bedtime) or twice daily. 1 each 0   ondansetron (ZOFRAN) 4 MG tablet Take 1 tablet (4 mg total) by  mouth 2 (two) times daily as needed for nausea. 50 tablet 3   rosuvastatin (CRESTOR) 10 MG tablet Take 1 tablet (10 mg total) by mouth daily. 90 tablet 3   spironolactone (ALDACTONE) 50 MG tablet Take 1 tablet (50 mg total) by mouth daily. 30 tablet 11   losartan (COZAAR) 100 MG tablet TAKE 1 TABLET BY MOUTH EVERY DAY (Patient not taking: Reported on 12/10/2021) 90 tablet 1   Facility-Administered Medications Prior to Visit  Medication Dose Route Frequency Provider Last Rate Last Admin   TDaP (BOOSTRIX) injection 0.5 mL  0.5 mL Intramuscular Once Burchette, BAlinda Sierras MD        Allergies  Allergen Reactions   Amoxicillin-Pot Clavulanate     Severe diarrhea   Codeine Sulfate     REACTION: "hyper"   Erythromycin Base    Invokana [Canagliflozin] Other (See Comments)    Recurrent yeast infections   Irbesartan-Hydrochlorothiazide    Latex    Victoza [Liraglutide] Nausea Only   Ozempic (0.25 Or 0.5 Mg-Dose) [Semaglutide(0.25 Or 0.537mDos)] Nausea Only        Objective:     Physical Exam Vitals and nursing note reviewed.  Constitutional:      General:  is not in acute distress.    Appearance: Normal appearance.  HENT:     Head: Normocephalic.  Pulmonary:     Effort: No respiratory distress.  Skin:    General: Skin is dry.     Coloration: Skin is not pale.  Neurological:     Mental Status: Pt is alert and oriented to person, place, and time.  Psychiatric:        Mood and Affect: Mood normal.   There were no vitals taken for this visit.  Wt Readings from Last 3 Encounters:  11/23/21 181 lb (82.1 kg)  10/20/21 173 lb 12.8 oz (78.8 kg)  10/05/21 175 lb (79.4 kg)   Reviewed labs-plt 80's    Assessment & Plan:   Problem List Items Addressed This Visit       Digestive   Cirrhosis (HCTravis  Other Visit Diagnoses     Acute non-recurrent maxillary sinusitis    -  Primary   Relevant Medications   benzonatate (TESSALON PERLES) 100 MG capsule   doxycycline (VIBRA-TABS) 100  MG tablet   Epistaxis          Sinusitis w/epistaxis(may be from lower plts, sinusitis,dry air).  Will tx w/abx.  Use nasal saline Cough-tessalon perles Cirrhosis-NAFLD-complicates above-reviewed labs  Meds ordered this encounter  Medications   benzonatate (TESSALON PERLES) 100 MG capsule    Sig: Take 1 capsule (100 mg total) by mouth 3 (three) times daily as  needed for cough.    Dispense:  20 capsule    Refill:  0   doxycycline (VIBRA-TABS) 100 MG tablet    Sig: Take 1 tablet (100 mg total) by mouth 2 (two) times daily for 7 days.    Dispense:  14 tablet    Refill:  0    I discussed the assessment and treatment plan with the patient. The patient was provided an opportunity to ask questions and all were answered. The patient agreed with the plan and demonstrated an understanding of the instructions.   The patient was advised to call back or seek an in-person evaluation if the symptoms worsen or if the condition fails to improve as anticipated.  I provided 20 minutes of face-to-face time during this encounter.   Wellington Hampshire, MD [place] [phone] (phone) [phone] (fax)  [place]

## 2021-12-12 ENCOUNTER — Encounter: Payer: Self-pay | Admitting: Gastroenterology

## 2021-12-13 ENCOUNTER — Telehealth: Payer: Self-pay | Admitting: Family Medicine

## 2021-12-13 MED ORDER — HYDROCODONE BIT-HOMATROP MBR 5-1.5 MG/5ML PO SOLN: 5.0000 mL | Freq: Three times a day (TID) | ORAL | 0 refills | Status: DC | PRN

## 2021-12-13 NOTE — Telephone Encounter (Signed)
Patient is aware 

## 2021-12-13 NOTE — Telephone Encounter (Signed)
Pt spouse call and stated pt had a mychart visit but want still need something call in for her cough because she is coughing so much that she can't get any sleep.want it call in to  [id number] [place] [address]  [phone] Fax:  [phone]   Pt want a call back when sent.

## 2021-12-13 NOTE — Telephone Encounter (Signed)
I sent in some Hycodan cough syrup.

## 2021-12-13 NOTE — Telephone Encounter (Signed)
Spoke with the patient. She stated she is not allergic to codeine . The chart is incorrect and her only allergy is epinephrine.

## 2021-12-15 ENCOUNTER — Other Ambulatory Visit: Payer: Self-pay

## 2021-12-15 ENCOUNTER — Encounter: Payer: Self-pay | Admitting: Internal Medicine

## 2021-12-15 ENCOUNTER — Ambulatory Visit: Payer: Medicare PPO | Admitting: Internal Medicine

## 2021-12-15 VITALS — BP 140/82 | HR 100 | Ht 62.0 in | Wt 173.0 lb

## 2021-12-15 DIAGNOSIS — E1165 Type 2 diabetes mellitus with hyperglycemia: Secondary | ICD-10-CM | POA: Diagnosis not present

## 2021-12-15 DIAGNOSIS — Z794 Long term (current) use of insulin: Secondary | ICD-10-CM

## 2021-12-15 DIAGNOSIS — E1142 Type 2 diabetes mellitus with diabetic polyneuropathy: Secondary | ICD-10-CM

## 2021-12-15 LAB — POCT GLYCOSYLATED HEMOGLOBIN (HGB A1C): Hemoglobin A1C: 7.3 % — AB (ref 4.0–5.6)

## 2021-12-15 NOTE — Progress Notes (Signed)
Name: Dana Dyer  Age/ Sex: 71 y.o., female   MRN/ DOB: [id number], [birth year]     PCP: Eulas Post, MD   Reason for Endocrinology Evaluation: Type 2 Diabetes Mellitus  Initial Endocrine Consultative Visit: 10/23/18    PATIENT IDENTIFIER: Dana Dyer is a 71 y.o. female with a past medical history of HTN, T2DM, Cirrhosis (Dx 2019), Fibromyalgia and dyslipidemia . The patient has followed with Endocrinology clinic since 10/23/18 for consultative assistance with management of her diabetes.  DIABETIC HISTORY:  Dana Dyer was diagnosed with T2DM in 2005. She used to be on SU but does not recall intolerance. She is intolerant to victoza - nauea and invokana- yeast infection. She was started on insulin therapy in 2009. Her hemoglobin A1c has ranged from 7.8 % in 2012, peaking at 10.9% in 2018.   Ozempic was stopped in 06/2019 due to persistent nausea.   Switched pravastatin to crestor 09/2019  SUBJECTIVE:   During the last visit (09/30/2021): A1c was 7.8%. We continued metformin and adjusted MDI regimen   Today (12/15/2021): Dana Dyer is here for a follow up on diabetes management.  She checks her blood sugars multiple times a day through CGM.  The patient has not had hypoglycemic episodes since the last clinic visit per   Continues to follow up with Gi for cirrhosis  Had recent sinus infection , has lingering cough  Denies nausea, vomiting but has been alternating between  diarrhea and constipation  She is on Lactulose started 12/2021  HOME DIABETES REGIMEN:  Lantus 34 units daily Humalog 10 units with Breakfast  Humalog 18 units with lunch and supper Metformin 1000 mg BID  Crestor 10 mg daily     CONTINUOUS GLUCOSE MONITORING RECORD INTERPRETATION    Dates of Recording: 12/29-1/10/2022  Sensor description:dexcom  Results statistics:   CGM use % of time 93  Average and SD 229/73  Time in range  31 %  % Time Above 180 33  % Time above 250 36   % Time Below target 0      Glycemic patterns summary: Overnight, her BG's are at the upper limit of range, they get worse during the day   Hyperglycemic episodes  postprandial   Hypoglycemic episodes occurred  n/a  Overnight periods: stable but upper limit of range     DIABETIC COMPLICATIONS: Microvascular complications:  Neuroathy Denies: retinopathy, nephropathy Last eye exam: Completed 09/06/2021     Macrovascular complications:   Denies: CAD, PVD, CVA    HISTORY:  Past Medical History:  Past Medical History:  Diagnosis Date   ALLERGIC RHINITIS 10/09/2007   Allergy    Arthritis    hips, spine   DIABETES MELLITUS, UNCONTROLLED 03/16/2009   FATIGUE 10/21/2008   FIBROMYALGIA 10/09/2007   GERD (gastroesophageal reflux disease)    diet  controlled, no meds   H/O vaginal hysterectomy    HYPERLIPIDEMIA 10/09/2007   diet controlled, no meds   HYPERTENSION 10/09/2007   Morton's neuroma    OBSTRUCTIVE SLEEP APNEA 10/09/2007   Plantar fasciitis    History - right   Sleep apnea    Uses CPAP   TRANSAMINASES, SERUM, ELEVATED 07/08/2009   Past Surgical History:  Past Surgical History:  Procedure Laterality Date   ABDOMINAL HYSTERECTOMY  1979   prolapsed uterus   BASAL CELL CARCINOMA EXCISION     BREAST BIOPSY Left 2012   left 2012 stereo   BREAST REDUCTION SURGERY Bilateral 1992   COLONOSCOPY  2006  [facility]   mamoplasty     reduction   REDUCTION MAMMAPLASTY Bilateral 1992   TONSILLECTOMY  1959   TUBAL LIGATION  1976   Social History:  reports that she has never smoked. She has never used smokeless tobacco. She reports that she does not drink alcohol and does not use drugs. Family History:  Family History  Problem Relation Age of Onset   Diabetes Mother    Arthritis Mother    Cancer Mother        breast   Breast cancer Mother 65   Alcohol abuse Father    Arthritis Father    Hypertension Maternal Grandfather    Miscarriages / Stillbirths Paternal  Grandfather    Breast cancer Maternal Aunt        diagnosed in her 36's   Colon polyps Neg Hx    Colon cancer Neg Hx    Esophageal cancer Neg Hx    Rectal cancer Neg Hx    Stomach cancer Neg Hx      HOME MEDICATIONS: Allergies as of 12/15/2021       Reactions   Amoxicillin-pot Clavulanate    Severe diarrhea   Codeine Sulfate    REACTION: "hyper"   Erythromycin Base    Invokana [canagliflozin] Other (See Comments)   Recurrent yeast infections   Irbesartan-hydrochlorothiazide    Latex    Victoza [liraglutide] Nausea Only   Ozempic (0.25 Or 0.5 Mg-dose) [semaglutide(0.25 Or 0.34m-dos)] Nausea Only        Medication List        Accurate as of December 15, 2021  2:28 PM. If you have any questions, ask your nurse or doctor.          amLODipine 5 MG tablet Commonly known as: NORVASC TAKE 1 TABLET BY MOUTH EVERY DAY   BD Pen Needle Nano 2nd Gen 32G X 4 MM Misc Generic drug: Insulin Pen Needle USE AS DIRECTED   benzonatate 100 MG capsule Commonly known as: Tessalon Perles Take 1 capsule (100 mg total) by mouth 3 (three) times daily as needed for cough.   betamethasone dipropionate 0.05 % cream   Dexcom G6 Sensor Misc 1 Device by Does not apply route as directed.   Dexcom G6 Transmitter Misc USE AS DIRECTED   doxycycline 100 MG tablet Commonly known as: VIBRA-TABS Take 1 tablet (100 mg total) by mouth 2 (two) times daily for 7 days.   DULoxetine 20 MG capsule Commonly known as: CYMBALTA TAKE 1 CAPSULE BY MOUTH EVERY DAY What changed:  how much to take when to take this   furosemide 20 MG tablet Commonly known as: Lasix Take 1 tablet (20 mg total) by mouth daily.   HumaLOG KwikPen 200 UNIT/ML KwikPen Generic drug: insulin lispro Max Daily 120   HYDROcodone bit-homatropine 5-1.5 MG/5ML syrup Commonly known as: HYCODAN Take 5 mLs by mouth every 8 (eight) hours as needed for cough.   Klor-Con M20 20 MEQ tablet Generic drug: potassium chloride  SA TAKE 1 TABLET BY MOUTH EVERY DAY   lactulose 10 GM/15ML solution Commonly known as: CHRONULAC Take 30 mLs (20 g total) by mouth daily.   Lantus SoloStar 100 UNIT/ML Solostar Pen Generic drug: insulin glargine Inject 30 Units into the skin daily.   losartan 100 MG tablet Commonly known as: COZAAR TAKE 1 TABLET BY MOUTH EVERY DAY   metFORMIN 1000 MG tablet Commonly known as: GLUCOPHAGE TAKE 1 TABLET BY MOUTH TWICE A DAY   MISC NATURAL PRODUCTS PO Take by  mouth.   NONFORMULARY OR COMPOUNDED ITEM Antifungal-terbinafine-3%, fluconazole -2%, tea tree oil-5%,urea- 10%,in DMSO suspension # 46m, apply to the affected nail(s) once (at bedtime) or twice daily.   ondansetron 4 MG tablet Commonly known as: Zofran Take 1 tablet (4 mg total) by mouth 2 (two) times daily as needed for nausea.   rosuvastatin 10 MG tablet Commonly known as: CRESTOR Take 1 tablet (10 mg total) by mouth daily.   spironolactone 50 MG tablet Commonly known as: Aldactone Take 1 tablet (50 mg total) by mouth daily.         OBJECTIVE:   Vital Signs: BP 140/82 (BP Location: Left Arm, Patient Position: Sitting, Cuff Size: Small)    Pulse 100    Ht 5' 2"  (1.575 m)    Wt 173 lb (78.5 kg)    SpO2 97%    BMI 31.64 kg/m   Wt Readings from Last 3 Encounters:  12/15/21 173 lb (78.5 kg)  11/23/21 181 lb (82.1 kg)  10/20/21 173 lb 12.8 oz (78.8 kg)     Exam: General: Pt appears well and is in NAD  Lungs: Clear with good BS bilat with no rales, rhonchi, or wheezes  Heart: RRR   Abdomen: Normoactive bowel sounds, soft, nontender, without masses or organomegaly palpable  Extremities: No  pretibial edema.  Neuro: MS is good with appropriate affect, pt is alert and Ox3   DM Foot 12/15/2021 The skin of the feet is intact without sores or ulcerations. The pedal pulses are 2+ on right and 2+ on left. The sensation is intact   to a screening 5.07, 10 gram monofilament on the left      DATA REVIEWED:  Lab  Results  Component Value Date   HGBA1C 7.3 (A) 12/15/2021   HGBA1C 7.8 (A) 09/30/2021   HGBA1C 10.3 (A) 06/02/2021   Results for LLOVADA, BARWICK(MRN [id number] as of 10/01/2021 07:58  Ref. Range 09/30/2021 10:48  Total CHOL/HDL Ratio Unknown 3  Cholesterol Latest Ref Range: 0 - 200 mg/dL 154  HDL Cholesterol Latest Ref Range: >39.00 mg/dL 59.30  LDL (calc) Latest Ref Range: 0 - 99 mg/dL 69  NonHDL Unknown 94.29  Triglycerides Latest Ref Range: 0.0 - 149.0 mg/dL 128.0  VLDL Latest Ref Range: 0.0 - 40.0 mg/dL 25.6  Results for LMERIDIAN, SCHERGER(MRN [id number] as of 10/01/2021 07:58  Ref. Range 09/30/2021 10:43  Creatinine,U Latest Units: mg/dL 13.1  Microalb, Ur Latest Ref Range: 0.0 - 1.9 mg/dL <0.7  MICROALB/CREAT RATIO Latest Ref Range: 0.0 - 30.0 mg/g 5.4    ASSESSMENT / PLAN / RECOMMENDATIONS:   1) Type 2 Diabetes Mellitus, OPtimally  controlled, With neuropathic complications - Most recent A1c of 7.3 %. Goal A1c < 7.5 %.   -I have praised the patient on improved glycemic control - She has been noted with hyperglycemia , partly due to the addition of Lactulose, will adjust insulin as below  - She is intolerant to GLP-1 agonists due to GI side effects and SGLT-2 inhibitors due to recurrent yeast infection    MEDICATIONS: - Increase  Lantus 36 units daily - Change Humalog 12 units with breakfast, 20 units with Lunch and 22 units with supper  - Continue metformin 1000 mg BID  - CF : HUmalog ( BG -140/25)    EDUCATION / INSTRUCTIONS: BG monitoring instructions: Patient is instructed to check her blood sugars 4 times a day, before meals and bedtime. [facility] if: BG persistently < 70  I reviewed the Rule of 15 for the treatment of hypoglycemia in detail with the patient. Literature supplied.   2) Dyslipidemia:   -Lipid panel acceptable    Medication Continue Crestor 10 mg daily      F/U in 3-4 months    Signed electronically by: Mack Guise, MD  [facility]  [facility] [address] [place], [place] [address] Phone: [phone] FAX: [phone]   CC: Eulas Post, [place] [address] Phone: [phone]  Fax: [phone]  Return to Endocrinology clinic as below: Future Appointments  Date Time Provider [place]  12/24/2021 11:10 AM Milus Banister, MD [facility]  03/17/2022  9:50 AM , Melanie Crazier, MD [facility] None

## 2021-12-15 NOTE — Patient Instructions (Addendum)
-   Keep Up the Good Work ! - Increase  Lantus 36 units daily  - Change Humalog 12 units with Breakfast,  20 units Lunch and 22 units supper - Continue Metformin 1000 mg, 1 tablet twice daily   - Humalog correctional insulin: ADD extra units on insulin to your meal-time Humalog  dose if your blood sugars are higher than 155. Use the scale below to help guide you:   Blood sugar before meal Number of units to inject  Less than 155 0 unit  156 - 180 1 units  181 - 205 2 units  206 - 230 3 units  231 - 255 4 units  256 - 280 5 units  281 - 305 6 units  306 - 330 7 units  331 - 355 8 units  356 - 380 9 units   381 - 405 10 units      HOW TO TREAT LOW BLOOD SUGARS (Blood sugar LESS THAN 70 MG/DL) Please follow the RULE OF 15 for the treatment of hypoglycemia treatment (when your (blood sugars are less than 70 mg/dL)   STEP 1: Take 15 grams of carbohydrates when your blood sugar is low, which includes:  3-4 GLUCOSE TABS  OR 3-4 OZ OF JUICE OR REGULAR SODA OR ONE TUBE OF GLUCOSE GEL    STEP 2: RECHECK blood sugar in 15 MINUTES STEP 3: If your blood sugar is still low at the 15 minute recheck --> then, go back to STEP 1 and treat AGAIN with another 15 grams of carbohydrates

## 2021-12-16 ENCOUNTER — Encounter: Payer: Self-pay | Admitting: Gastroenterology

## 2021-12-23 ENCOUNTER — Telehealth: Payer: Self-pay | Admitting: Family Medicine

## 2021-12-23 NOTE — Telephone Encounter (Signed)
Patient says that she is still having lingering sinus issues and cough. She was seen already on 1/6 for the symptoms and was prescribed a cough medicine. She is wanting to know if she could receive steroid shots to help. She says that she has received them before when she has had issues with her sinus and it has helped.  Patient says that she also went to her endocrinologist and that they confirmed that there was no congestion in her chest.  Patient can be reached at [phone] and is requesting a call back if steroids are something that could help with her issues.  Please advise.

## 2021-12-24 ENCOUNTER — Encounter: Payer: Self-pay | Admitting: Gastroenterology

## 2021-12-24 ENCOUNTER — Ambulatory Visit: Payer: Medicare PPO | Admitting: Gastroenterology

## 2021-12-24 VITALS — BP 112/50 | HR 92 | Ht 63.0 in | Wt 172.8 lb

## 2021-12-24 DIAGNOSIS — K7460 Unspecified cirrhosis of liver: Secondary | ICD-10-CM | POA: Diagnosis not present

## 2021-12-24 MED ORDER — LACTULOSE 10 GM/15ML PO SOLN: ORAL | 0 refills | Status: DC

## 2021-12-24 NOTE — Progress Notes (Signed)
Review of pertinent gastrointestinal problems: 1.  History of adenomatous polyps in her colon.  Colonoscopy 2006 Dr. Timmothy Euler found no polyps.  Colonoscopy Dr. Ardis Hughs December 2016 found 7 subcentimeter polyps.  These were all adenomatous polyps on pathology.  Also documented left-sided diverticulosis.  I recommend she have a repeat colonoscopy at 3-year interval.   Colonoscopy January 2020 four polyps were removed from her colon, all were adenomatous on pathology.  She was recommended to have repeat colonoscopy at 3-year interval 2. Cirrhosis, likely from longstanding fatty liver disease, out-of-control diabetes.  Diagnosed 2019.  Lab workup 2019: Hep C Ab negative, Hep B Surface Ag negative, hepatitis B surface antibody negative, hep B Core Igm negative, Hep A IgM negative. Ferritin normal, HIV negative, ANA negative, antimitochondrial antibody negative, hepatitis a antibody total negative; slightly low platelets, transaminases slightly elevated.  No edema. [place] 07/2018: slightly nodular, suspicious for cirrhosis. [place] 12/2019 cirrhosis without mass lesions.  Ultrasound 08/2020 showed cirrhosis without focal mass lesions.  Gallbladder thick but without Murphy sign.  Ultrasound May 2022 showed cirrhosis, trace perihepatic ascites, no focal lesions in the liver. EGD 12/2018 small distal esophagus varices, mild portal gastropathy, no gastric varices.  She was started on nadolol (eventually at 42m daily) AFP 11/2021 normal MELD 12 (11/2021 labs) Hepatitis A, B vaccination series started 11/2018 Mild encephalopathy, memory disturbance, slight tremors, ammonia 65 11/2021.  Started lactulose which might have raised her blood sugars a bit.   HPI: This is a very pleasant 71year old woman   I last saw her here in the office about 1 month ago.  I was concerned that she was having some mild hepatic decompensation and possible mild encephalopathy.  Her fluid status was getting a bit difficult to control with her  potassium levels.  I asked that she continue her Lasix 20 mg once daily and I also started Aldactone 50 mg once daily.  I started her on lactulose 30 cc once daily and I got an ammonia level.   Since then she has had repeat blood tests, I restarted her on her potassium supplement 20 mEq once daily.  She called because her blood sugars were becoming quite difficult to manage suspecting that it is from the lactulose.  Her weight is down 9 pounds since her last visit here about 1 month ago.  She saw her endocrinologist recently and her insulin was increased.  She feels overall much better.  Much more clear from a mental status perspective.  She took lactulose for a week or 2 and then began to have problems with high blood sugars and so she stopped.  For a while she was taking 6 tablespoons a day.  She now feels overall quite a bit better, tremor is gone.  Mental confusion seems much cleared up very evident in the office today.   ROS: complete GI ROS as described in HPI, all other review negative.  Constitutional:  No unintentional weight loss   Past Medical History:  Diagnosis Date   ALLERGIC RHINITIS 10/09/2007   Allergy    Arthritis    hips, spine   DIABETES MELLITUS, UNCONTROLLED 03/16/2009   FATIGUE 10/21/2008   FIBROMYALGIA 10/09/2007   GERD (gastroesophageal reflux disease)    diet  controlled, no meds   H/O vaginal hysterectomy    HYPERLIPIDEMIA 10/09/2007   diet controlled, no meds   HYPERTENSION 10/09/2007   Morton's neuroma    OBSTRUCTIVE SLEEP APNEA 10/09/2007   Plantar fasciitis    History - right  Sleep apnea    Uses CPAP   TRANSAMINASES, SERUM, ELEVATED 07/08/2009    Past Surgical History:  Procedure Laterality Date   ABDOMINAL HYSTERECTOMY  1979   prolapsed uterus   BASAL CELL CARCINOMA EXCISION     BREAST BIOPSY Left 2012   left 2012 stereo   BREAST REDUCTION SURGERY Bilateral 1992   COLONOSCOPY  2006   [facility]   mamoplasty     reduction   REDUCTION  MAMMAPLASTY Bilateral 1992   TONSILLECTOMY  1959   TUBAL LIGATION  1976    Current Outpatient Medications  Medication Sig Dispense Refill   amLODipine (NORVASC) 5 MG tablet TAKE 1 TABLET BY MOUTH EVERY DAY 90 tablet 0   BD PEN NEEDLE NANO 2ND GEN 32G X 4 MM MISC USE AS DIRECTED 400 each 3   betamethasone dipropionate 0.05 % cream      Continuous Blood Gluc Sensor (DEXCOM G6 SENSOR) MISC 1 Device by Does not apply route as directed. 3 each 11   Continuous Blood Gluc Transmit (DEXCOM G6 TRANSMITTER) MISC USE AS DIRECTED 1 each 3   furosemide (LASIX) 20 MG tablet Take 1 tablet (20 mg total) by mouth daily. 30 tablet 11   HYDROcodone bit-homatropine (HYCODAN) 5-1.5 MG/5ML syrup Take 5 mLs by mouth every 8 (eight) hours as needed for cough. 120 mL 0   insulin glargine (LANTUS SOLOSTAR) 100 UNIT/ML Solostar Pen Inject 30 Units into the skin daily. 45 mL 3   insulin lispro (HUMALOG KWIKPEN) 200 UNIT/ML KwikPen Max Daily 120 30 mL 3   KLOR-CON M20 20 MEQ tablet TAKE 1 TABLET BY MOUTH EVERY DAY 90 tablet 1   lactulose (CHRONULAC) 10 GM/15ML solution Take 3 tablespoons daily. 236 mL 0   losartan (COZAAR) 100 MG tablet TAKE 1 TABLET BY MOUTH EVERY DAY 90 tablet 1   metFORMIN (GLUCOPHAGE) 1000 MG tablet TAKE 1 TABLET BY MOUTH TWICE A DAY 180 tablet 3   NONFORMULARY OR COMPOUNDED ITEM Antifungal-terbinafine-3%, fluconazole -2%, tea tree oil-5%,urea- 10%,in DMSO suspension # 40m, apply to the affected nail(s) once (at bedtime) or twice daily. 1 each 0   ondansetron (ZOFRAN) 4 MG tablet Take 1 tablet (4 mg total) by mouth 2 (two) times daily as needed for nausea. 50 tablet 3   rosuvastatin (CRESTOR) 10 MG tablet Take 1 tablet (10 mg total) by mouth daily. 90 tablet 3   spironolactone (ALDACTONE) 50 MG tablet Take 1 tablet (50 mg total) by mouth daily. 30 tablet 11   Current Facility-Administered Medications  Medication Dose Route Frequency Provider Last Rate Last Admin   TDaP (BOOSTRIX) injection 0.5 mL   0.5 mL Intramuscular Once BEulas Post MD        Allergies as of 12/24/2021 - Review Complete 12/24/2021  Allergen Reaction Noted   Amoxicillin-pot clavulanate  03/09/2012   Codeine sulfate  03/16/2009   Erythromycin base  04/07/2009   Invokana [canagliflozin] Other (See Comments) 05/18/2015   Irbesartan-hydrochlorothiazide  04/07/2009   Latex     Victoza [liraglutide] Nausea Only 03/10/2016   Ozempic (0.25 or 0.5 mg-dose) [semaglutide(0.25 or 0.525mdos)] Nausea Only 09/09/2019    Family History  Problem Relation Age of Onset   Diabetes Mother    Arthritis Mother    Cancer Mother        breast   Breast cancer Mother [id number] Alcohol abuse Father    Arthritis Father    Hypertension Maternal Grandfather    Miscarriages / Stillbirths Paternal Grandfather  Breast cancer Maternal Aunt        diagnosed in her 44's   Colon polyps Neg Hx    Colon cancer Neg Hx    Esophageal cancer Neg Hx    Rectal cancer Neg Hx    Stomach cancer Neg Hx     Social History   Socioeconomic History   Marital status: Married    Spouse name: Not on file   Number of children: Not on file   Years of education: Not on file   Highest education level: Not on file  Occupational History   Not on file  Tobacco Use   Smoking status: Never   Smokeless tobacco: Never  Vaping Use   Vaping Use: Never used  Substance and Sexual Activity   Alcohol use: No    Alcohol/week: 0.0 standard drinks   Drug use: No   Sexual activity: Yes    Birth control/protection: Other-see comments, Post-menopausal    Comment: Hysterectomy  Other Topics Concern   Not on file  Social History Narrative   Not on file   Social Determinants of Health   Financial Resource Strain: Low Risk    Difficulty of Paying Living Expenses: Not hard at all  Food Insecurity: No Food Insecurity   Worried About Charity fundraiser in the Last Year: Never true   Ran Out of Food in the Last Year: Never true  Transportation Needs: No  Transportation Needs   Lack of Transportation (Medical): No   Lack of Transportation (Non-Medical): No  Physical Activity: Insufficiently Active   Days of Exercise per Week: 3 days   Minutes of Exercise per Session: 30 min  Stress: No Stress Concern Present   Feeling of Stress : Not at all  Social Connections: Unknown   Frequency of Communication with Friends and Family: Three times a week   Frequency of Social Gatherings with Friends and Family: Not on file   Attends Religious Services: Not on file   Active Member of Clubs or Organizations: No   Attends Archivist Meetings: Never   Marital Status: Married  Human resources officer Violence: Not At Risk   Fear of Current or Ex-Partner: No   Emotionally Abused: No   Physically Abused: No   Sexually Abused: No     Physical Exam: BP (!) 112/50    Pulse 92    Ht 5' 3"  (1.6 m)    Wt 172 lb 12.8 oz (78.4 kg)    SpO2 98%    BMI 30.61 kg/m  Constitutional: generally well-appearing Psychiatric: alert and oriented x3 Abdomen: soft, nontender, nondistended, no obvious ascites, no peritoneal signs, normal bowel sounds No peripheral edema noted in lower extremities  Assessment and plan: 71 y.o. female with decompensated cirrhosis  Encephalopathy is much improved after just a couple weeks of lactulose.  She was taking 6 tablespoons daily and beginning to have some problems with her blood sugars.  Lactulose should not raise blood sugars too much from recent reading I have done.  Regardless her insulin doses were increased by her endocrinologist.  I would certainly like to get her back at least on some lactulose daily, she is going to start back on 3 tablespoons once daily instead of 6.  She will return to see me in 6 weeks and we will get basic metabolic profile in 4 weeks to check her potassium and her creatinine levels.  We will have to consider adding Xifaxan if she starts to have encephalopathic issues again.  She is hoping to go overseas  with some friends to [place] in [place] in the spring.  I think that would probably be safe as long as her mental status and encephalopathy does not significantly recur and we are on a stable dose of her diuretics with good electrolyte and creatinine levels.  Please see the "Patient Instructions" section for addition details about the plan.  Owens Loffler, MD [facility] 12/24/2021, 12:05 PM   Total time on date of encounter was 25 minutes (this included time spent preparing to see the patient reviewing records; obtaining and/or reviewing separately obtained history; performing a medically appropriate exam and/or evaluation; counseling and educating the patient and family if present; ordering medications, tests or procedures if applicable; and documenting clinical information in the health record).

## 2021-12-24 NOTE — Telephone Encounter (Signed)
Spoke with the patient. She stated she is very upset no one called her back yesterday and that she really needed help with this. I explained that Dr. Elease Hashimoto is out of the office on Thursdays and she stated she knows that but still should have gotten a call back with some help.   I discussed Dr.Burchettes message with the patient and she stated that she does not want any steroids, oral or IM, because she has been taking cough medication and it has made her blood sugar go up.  Will send to Dr. Elease Hashimoto as an Juluis Rainier.

## 2021-12-24 NOTE — Patient Instructions (Addendum)
If you are age 73 or older, your body mass index should be between 23-30. Your Body mass index is 30.61 kg/m. If this is out of the aforementioned range listed, please consider follow up with your Primary Care Provider. ________________________________________________________  The [facility] providers would like to encourage you to use [facility] to communicate with providers for non-urgent requests or questions.  Due to long hold times on the telephone, sending your provider a message by [facility] may be a faster and more efficient way to get a response.  Please allow 48 business hours for a response.  Please remember that this is for non-urgent requests.  _______________________________________________________  Your provider has requested that you go to the basement level for lab work in 4 weeks (01-21-22) . Press "B" on the elevator. The lab is located at the first door on the left as you exit the elevator.  You are scheduled to follow up on 02-15-22 at 2:10pm.  Resume Lactulose 3 tablespoons daily.  Thank you for entrusting me with your care and choosing [facility].  Dr Ardis Hughs

## 2021-12-30 ENCOUNTER — Other Ambulatory Visit: Payer: Self-pay | Admitting: Family Medicine

## 2022-01-03 ENCOUNTER — Ambulatory Visit: Payer: Medicare PPO | Admitting: Family Medicine

## 2022-01-03 VITALS — BP 140/60 | HR 105 | Temp 98.2°F | Wt 175.2 lb

## 2022-01-03 DIAGNOSIS — R058 Other specified cough: Secondary | ICD-10-CM

## 2022-01-03 MED ORDER — HYDROCODONE BIT-HOMATROP MBR 5-1.5 MG/5ML PO SOLN: 5.0000 mL | Freq: Three times a day (TID) | ORAL | 0 refills | Status: DC | PRN

## 2022-01-03 MED ORDER — MUPIROCIN 2 % EX OINT: 1.0000 "application " | TOPICAL_OINTMENT | Freq: Two times a day (BID) | CUTANEOUS | 1 refills | Status: DC

## 2022-01-03 NOTE — Patient Instructions (Signed)
Give this another couple of weeks and if cough not resolving let me know

## 2022-01-03 NOTE — Progress Notes (Signed)
Established Patient Office Visit  Subjective:  Patient ID: Dana Dyer, female    DOB: Dec 24, 1950  Age: 71 y.o. MRN: [id number]  CC:  Chief Complaint  Patient presents with   Cough    Dry cough, x 3 weeks, drainage, SOB    HPI [name] presents for 3-week history of dry cough.  She thinks she may have had some subtle postnasal drainage.  No hemoptysis.  Denies any significant dyspnea.  Denies any facial pain or purulent nasal secretions.  Does have history of GERD but no active GERD symptoms.  Non-smoker.  No recent fever.  She does note a little bit of intermittent blood from her nares which is really more crusted and dried she thinks related to the dryness at night.  Currently not using humidifier.  Tried some nasal saline intermittently  Past Medical History:  Diagnosis Date   ALLERGIC RHINITIS 10/09/2007   Allergy    Arthritis    hips, spine   DIABETES MELLITUS, UNCONTROLLED 03/16/2009   FATIGUE 10/21/2008   FIBROMYALGIA 10/09/2007   GERD (gastroesophageal reflux disease)    diet  controlled, no meds   H/O vaginal hysterectomy    HYPERLIPIDEMIA 10/09/2007   diet controlled, no meds   HYPERTENSION 10/09/2007   Morton's neuroma    OBSTRUCTIVE SLEEP APNEA 10/09/2007   Plantar fasciitis    History - right   Sleep apnea    Uses CPAP   TRANSAMINASES, SERUM, ELEVATED 07/08/2009    Past Surgical History:  Procedure Laterality Date   ABDOMINAL HYSTERECTOMY  1979   prolapsed uterus   BASAL CELL CARCINOMA EXCISION     BREAST BIOPSY Left 2012   left 2012 stereo   BREAST REDUCTION SURGERY Bilateral 1992   COLONOSCOPY  2006   [facility]   mamoplasty     reduction   REDUCTION MAMMAPLASTY Bilateral 1992   TONSILLECTOMY  1959   TUBAL LIGATION  1976    Family History  Problem Relation Age of Onset   Diabetes Mother    Arthritis Mother    Cancer Mother        breast   Breast cancer Mother 50   Alcohol abuse Father    Arthritis Father    Hypertension Maternal  Grandfather    Miscarriages / Stillbirths Paternal Grandfather    Breast cancer Maternal Aunt        diagnosed in her 84's   Colon polyps Neg Hx    Colon cancer Neg Hx    Esophageal cancer Neg Hx    Rectal cancer Neg Hx    Stomach cancer Neg Hx     Social History   Socioeconomic History   Marital status: Married    Spouse name: Not on file   Number of children: Not on file   Years of education: Not on file   Highest education level: Not on file  Occupational History   Not on file  Tobacco Use   Smoking status: Never   Smokeless tobacco: Never  Vaping Use   Vaping Use: Never used  Substance and Sexual Activity   Alcohol use: No    Alcohol/week: 0.0 standard drinks   Drug use: No   Sexual activity: Yes    Birth control/protection: Other-see comments, Post-menopausal    Comment: Hysterectomy  Other Topics Concern   Not on file  Social History Narrative   Not on file   Social Determinants of Health   Financial Resource Strain: Low Risk    Difficulty  of Paying Living Expenses: Not hard at all  Food Insecurity: No Food Insecurity   Worried About [place] in the Last Year: Never true   [place] in the Last Year: Never true  Transportation Needs: No Transportation Needs   Lack of Transportation (Medical): No   Lack of Transportation (Non-Medical): No  Physical Activity: Insufficiently Active   Days of Exercise per Week: 3 days   Minutes of Exercise per Session: 30 min  Stress: No Stress Concern Present   Feeling of Stress : Not at all  Social Connections: Unknown   Frequency of Communication with Friends and Family: Three times a week   Frequency of Social Gatherings with Friends and Family: Not on file   Attends Religious Services: Not on file   Active Member of Clubs or Organizations: No   Attends Archivist Meetings: Never   Marital Status: Married  Human resources officer Violence: Not At Risk   Fear of Current or Ex-Partner: No    Emotionally Abused: No   Physically Abused: No   Sexually Abused: No    Outpatient Medications Prior to Visit  Medication Sig Dispense Refill   amLODipine (NORVASC) 5 MG tablet TAKE 1 TABLET BY MOUTH EVERY DAY 90 tablet 0   BD PEN NEEDLE NANO 2ND GEN 32G X 4 MM MISC USE AS DIRECTED 400 each 3   betamethasone dipropionate 0.05 % cream      Continuous Blood Gluc Sensor (DEXCOM G6 SENSOR) MISC 1 Device by Does not apply route as directed. 3 each 11   Continuous Blood Gluc Transmit (DEXCOM G6 TRANSMITTER) MISC USE AS DIRECTED 1 each 3   furosemide (LASIX) 20 MG tablet Take 1 tablet (20 mg total) by mouth daily. 30 tablet 11   insulin glargine (LANTUS SOLOSTAR) 100 UNIT/ML Solostar Pen Inject 30 Units into the skin daily. 45 mL 3   insulin lispro (HUMALOG KWIKPEN) 200 UNIT/ML KwikPen Max Daily 120 30 mL 3   KLOR-CON M20 20 MEQ tablet TAKE 1 TABLET BY MOUTH EVERY DAY 90 tablet 1   lactulose (CHRONULAC) 10 GM/15ML solution Take 3 tablespoons daily. 236 mL 0   losartan (COZAAR) 100 MG tablet TAKE 1 TABLET BY MOUTH EVERY DAY 90 tablet 1   metFORMIN (GLUCOPHAGE) 1000 MG tablet TAKE 1 TABLET BY MOUTH TWICE A DAY 180 tablet 3   NONFORMULARY OR COMPOUNDED ITEM Antifungal-terbinafine-3%, fluconazole -2%, tea tree oil-5%,urea- 10%,in DMSO suspension # 51m, apply to the affected nail(s) once (at bedtime) or twice daily. 1 each 0   ondansetron (ZOFRAN) 4 MG tablet Take 1 tablet (4 mg total) by mouth 2 (two) times daily as needed for nausea. 50 tablet 3   rosuvastatin (CRESTOR) 10 MG tablet Take 1 tablet (10 mg total) by mouth daily. 90 tablet 3   spironolactone (ALDACTONE) 50 MG tablet Take 1 tablet (50 mg total) by mouth daily. 30 tablet 11   HYDROcodone bit-homatropine (HYCODAN) 5-1.5 MG/5ML syrup Take 5 mLs by mouth every 8 (eight) hours as needed for cough. 120 mL 0   Facility-Administered Medications Prior to Visit  Medication Dose Route Frequency Provider Last Rate Last Admin   TDaP (BOOSTRIX)  injection 0.5 mL  0.5 mL Intramuscular Once BEulas Post MD        Allergies  Allergen Reactions   Amoxicillin-Pot Clavulanate     Severe diarrhea   Codeine Sulfate     REACTION: "hyper"   Erythromycin Base    Invokana [Canagliflozin] Other (  See Comments)    Recurrent yeast infections   Irbesartan-Hydrochlorothiazide    Latex    Victoza [Liraglutide] Nausea Only   Ozempic (0.25 Or 0.5 Mg-Dose) [Semaglutide(0.25 Or 0.69m-Dos)] Nausea Only    ROS Review of Systems  Constitutional:  Negative for chills and fever.  HENT:  Negative for sinus pressure and sinus pain.   Respiratory:  Positive for cough. Negative for shortness of breath and wheezing.   Cardiovascular:  Negative for chest pain.     Objective:    Physical Exam Vitals reviewed.  Constitutional:      Appearance: Normal appearance.  HENT:     Nose:     Comments: She has little bit of dry crusted appearing blood left naris anterior nasal septum.  No other abnormalities noted.  No purulent secretions. Cardiovascular:     Rate and Rhythm: Normal rate and regular rhythm.  Pulmonary:     Effort: Pulmonary effort is normal.     Breath sounds: Normal breath sounds. No wheezing or rales.  Neurological:     Mental Status: She is alert.    BP 140/60 (BP Location: Left Arm, Patient Position: Sitting, Cuff Size: Normal)    Pulse (!) 105    Temp 98.2 F (36.8 C) (Oral)    Wt 175 lb 3.2 oz (79.5 kg)    SpO2 98%    BMI 31.04 kg/m  Wt Readings from Last 3 Encounters:  01/03/22 175 lb 3.2 oz (79.5 kg)  12/24/21 172 lb 12.8 oz (78.4 kg)  12/15/21 173 lb (78.5 kg)     Health Maintenance Due  Topic Date Due   Zoster Vaccines- Shingrix (1 of 2) Never done   FOOT EXAM  05/29/2020   OPHTHALMOLOGY EXAM  09/03/2021   TETANUS/TDAP  12/14/2021   COLONOSCOPY (Pts 45-44yrInsurance coverage will need to be confirmed)  12/26/2021    There are no preventive care reminders to display for this patient.  Lab Results   Component Value Date   TSH 2.50 10/20/2021   Lab Results  Component Value Date   WBC 4.3 11/23/2021   HGB 9.9 (L) 11/23/2021   HCT 31.4 (L) 11/23/2021   MCV 75.1 (L) 11/23/2021   PLT 89.0 (L) 11/23/2021   Lab Results  Component Value Date   NA 135 12/07/2021   K 3.3 (L) 12/07/2021   CO2 25 12/07/2021   GLUCOSE 276 (H) 12/07/2021   BUN 10 12/07/2021   CREATININE 0.84 12/07/2021   BILITOT 2.1 (H) 11/23/2021   ALKPHOS 75 11/23/2021   AST 41 (H) 11/23/2021   ALT 32 11/23/2021   PROT 7.3 11/23/2021   ALBUMIN 3.7 11/23/2021   CALCIUM 9.0 12/07/2021   GFR 69.48 12/07/2021   Lab Results  Component Value Date   CHOL 154 09/30/2021   Lab Results  Component Value Date   HDL 59.30 09/30/2021   Lab Results  Component Value Date   LDLCALC 69 09/30/2021   Lab Results  Component Value Date   TRIG 128.0 09/30/2021   Lab Results  Component Value Date   CHOLHDL 3 09/30/2021   Lab Results  Component Value Date   HGBA1C 7.3 (A) 12/15/2021      Assessment & Plan:   Dry cough of about 3 weeks duration.  Nonfocal exam.  Does not have any red flags such as hypoxia, fever, hemoptysis, etc. we refilled her Hycodan cough syrup which she has used with good relief in the past and she is using this sparingly just mostly  at night  If cough not resolved in 2 weeks she will be in touch and follow-up sooner as needed  Continue measures to reduce intranasal dryness with good hydration, consideration for vaporizer or humidifier in her bedroom at night, intranasal saline, and consider topical Bactroban ointment intranasally at night  Meds ordered this encounter  Medications   HYDROcodone bit-homatropine (HYCODAN) 5-1.5 MG/5ML syrup    Sig: Take 5 mLs by mouth every 8 (eight) hours as needed for cough.    Dispense:  120 mL    Refill:  0   mupirocin ointment (BACTROBAN) 2 %    Sig: Apply 1 application topically 2 (two) times daily.    Dispense:  22 g    Refill:  1    Follow-up: No  follow-ups on file.    Carolann Littler, MD

## 2022-01-12 ENCOUNTER — Other Ambulatory Visit: Payer: Medicare PPO

## 2022-01-12 ENCOUNTER — Encounter: Payer: Self-pay | Admitting: Gastroenterology

## 2022-01-12 ENCOUNTER — Telehealth: Payer: Self-pay | Admitting: Family Medicine

## 2022-01-12 ENCOUNTER — Ambulatory Visit (INDEPENDENT_AMBULATORY_CARE_PROVIDER_SITE_OTHER): Payer: Medicare PPO

## 2022-01-12 ENCOUNTER — Other Ambulatory Visit: Payer: Self-pay

## 2022-01-12 DIAGNOSIS — R053 Chronic cough: Secondary | ICD-10-CM

## 2022-01-12 DIAGNOSIS — R059 Cough, unspecified: Secondary | ICD-10-CM | POA: Diagnosis not present

## 2022-01-12 NOTE — Telephone Encounter (Signed)
Recommend she get chest x-ray.  I placed order for chest x-ray and have her schedule to get this done soon.  Also inquire to see if she is having any progressive or persistent sinusitis symptoms.

## 2022-01-12 NOTE — Telephone Encounter (Signed)
Pt call and stated dr. Elease Hashimoto wanted to know if her cough is no better and she stated it is no better

## 2022-01-12 NOTE — Telephone Encounter (Signed)
Spoke with the patient and discussed Dr. Anastasio Auerbach message in detail. Patient expressed understanding. Nothing further needed at this time.  Appointment for xray has been scheduled for today.

## 2022-01-13 ENCOUNTER — Telehealth: Payer: Self-pay | Admitting: Family Medicine

## 2022-01-13 NOTE — Telephone Encounter (Signed)
Patient calling in with respiratory symptoms: Shortness of breath, chest pain, palpitations or other red words send to Triage  Does the patient have a fever over 100, cough, congestion, sore throat, runny nose, lost of taste/smell (please list symptoms that patient has)? cough  What date did symptoms start?6 wks ago (If over 5 days ago, pt may be scheduled for in person visit)  Have you tested for Covid in the last 5 days? No   If yes, was it positive []  OR negative [] ? If positive in the last 5 days, please schedule virtual visit now. If negative, schedule for an in person OV with the next available provider if PCP has no openings. Please also let patient know they will be tested again (follow the script below)  "you will have to arrive 76mns prior to your appt time to be Covid tested. Please park in back of office at the cone & call [phone] let the staff know you have arrived. A staff member will meet you at your car to do a rapid covid test. Once the test has resulted you will be notified by phone of your results to determine if appt will remain an in person visit or be converted to a virtual/phone visit. If you arrive less than 355ms before your appt time, your visit will be automatically converted to virtual & any recommended testing will happen AFTER the visit." Pt has an appt with cory on 01-14-2022 9 am  THINGS TO REMEMBER  If no availability for virtual visit in office,  please schedule another [place] office  If no availability at another [facility], please instruct patient that they can schedule an evisit or virtual visit through their mychart account. Visits up to 8pm  patients can be seen in office 5 days after positive COVID test

## 2022-01-14 ENCOUNTER — Encounter: Payer: Self-pay | Admitting: Adult Health

## 2022-01-14 ENCOUNTER — Ambulatory Visit (INDEPENDENT_AMBULATORY_CARE_PROVIDER_SITE_OTHER): Payer: Medicare PPO | Admitting: Adult Health

## 2022-01-14 VITALS — BP 120/60 | HR 83 | Temp 98.4°F | Ht 63.0 in | Wt 174.0 lb

## 2022-01-14 DIAGNOSIS — R051 Acute cough: Secondary | ICD-10-CM

## 2022-01-14 DIAGNOSIS — J0140 Acute pansinusitis, unspecified: Secondary | ICD-10-CM | POA: Diagnosis not present

## 2022-01-14 MED ORDER — AZITHROMYCIN 250 MG PO TABS: ORAL_TABLET | ORAL | 0 refills | Status: AC

## 2022-01-14 MED ORDER — FLUTICASONE PROPIONATE 50 MCG/ACT NA SUSP: 2.0000 | Freq: Every day | NASAL | 6 refills | Status: DC

## 2022-01-14 NOTE — Progress Notes (Signed)
Subjective:    Patient ID: DYLIN IHNEN, female    DOB: May 21, 1951, 71 y.o.   MRN: [id number]  HPI 71 year old female who  has a past medical history of ALLERGIC RHINITIS (10/09/2007), Allergy, Arthritis, DIABETES MELLITUS, UNCONTROLLED (03/16/2009), FATIGUE (10/21/2008), FIBROMYALGIA (10/09/2007), GERD (gastroesophageal reflux disease), H/O vaginal hysterectomy, HYPERLIPIDEMIA (10/09/2007), HYPERTENSION (10/09/2007), Morton's neuroma, OBSTRUCTIVE SLEEP APNEA (10/09/2007), Plantar fasciitis, Sleep apnea, and TRANSAMINASES, SERUM, ELEVATED (07/08/2009).  She is a patient of Dr. Elease Hashimoto who I am seeing today for cough x " 6 weeks". She has been seen by her PCP for this on 01/03/2021.  She was prescribed Hycodan cough syrup, which she has been using without resolution, actually stopped using the medication because it "was causing me to feel crazy".  She continues to have a dry cough that is constant but worse at night.  Denies fevers, chills, shortness of breath, or wheezing.  Had a chest x-ray done 2 days ago which was negative for acute cardiopulmonary disease.   She also feels a constant postnasal drip and has developed some minor sinus pressure.  She also reports abnormal odor that she smells almost like "ammonia".  Her husband reports that he cannot smell this odor.   Review of Systems See HPI   Past Medical History:  Diagnosis Date   ALLERGIC RHINITIS 10/09/2007   Allergy    Arthritis    hips, spine   DIABETES MELLITUS, UNCONTROLLED 03/16/2009   FATIGUE 10/21/2008   FIBROMYALGIA 10/09/2007   GERD (gastroesophageal reflux disease)    diet  controlled, no meds   H/O vaginal hysterectomy    HYPERLIPIDEMIA 10/09/2007   diet controlled, no meds   HYPERTENSION 10/09/2007   Morton's neuroma    OBSTRUCTIVE SLEEP APNEA 10/09/2007   Plantar fasciitis    History - right   Sleep apnea    Uses CPAP   TRANSAMINASES, SERUM, ELEVATED 07/08/2009    Social History   Socioeconomic History   Marital  status: Married    Spouse name: Not on file   Number of children: Not on file   Years of education: Not on file   Highest education level: Not on file  Occupational History   Not on file  Tobacco Use   Smoking status: Never   Smokeless tobacco: Never  Vaping Use   Vaping Use: Never used  Substance and Sexual Activity   Alcohol use: No    Alcohol/week: 0.0 standard drinks   Drug use: No   Sexual activity: Yes    Birth control/protection: Other-see comments, Post-menopausal    Comment: Hysterectomy  Other Topics Concern   Not on file  Social History Narrative   Not on file   Social Determinants of Health   Financial Resource Strain: Low Risk    Difficulty of Paying Living Expenses: Not hard at all  Food Insecurity: No Food Insecurity   Worried About Charity fundraiser in the Last Year: Never true   Arboriculturist in the Last Year: Never true  Transportation Needs: No Transportation Needs   Lack of Transportation (Medical): No   Lack of Transportation (Non-Medical): No  Physical Activity: Insufficiently Active   Days of Exercise per Week: 3 days   Minutes of Exercise per Session: 30 min  Stress: No Stress Concern Present   Feeling of Stress : Not at all  Social Connections: Unknown   Frequency of Communication with Friends and Family: Three times a week   Frequency of Social Gatherings with  Friends and Family: Not on file   Attends Religious Services: Not on file   Active Member of Clubs or Organizations: No   Attends Archivist Meetings: Never   Marital Status: Married  Human resources officer Violence: Not At Risk   Fear of Current or Ex-Partner: No   Emotionally Abused: No   Physically Abused: No   Sexually Abused: No    Past Surgical History:  Procedure Laterality Date   ABDOMINAL HYSTERECTOMY  1979   prolapsed uterus   BASAL CELL CARCINOMA EXCISION     BREAST BIOPSY Left 2012   left 2012 stereo   BREAST REDUCTION SURGERY Bilateral 1992   COLONOSCOPY   2006   [facility]   mamoplasty     reduction   REDUCTION MAMMAPLASTY Bilateral 1992   TONSILLECTOMY  1959   TUBAL LIGATION  1976    Family History  Problem Relation Age of Onset   Diabetes Mother    Arthritis Mother    Cancer Mother        breast   Breast cancer Mother 92   Alcohol abuse Father    Arthritis Father    Hypertension Maternal Grandfather    Miscarriages / Stillbirths Paternal Grandfather    Breast cancer Maternal Aunt        diagnosed in her 51's   Colon polyps Neg Hx    Colon cancer Neg Hx    Esophageal cancer Neg Hx    Rectal cancer Neg Hx    Stomach cancer Neg Hx     Allergies  Allergen Reactions   Amoxicillin-Pot Clavulanate     Severe diarrhea   Codeine Sulfate     REACTION: "hyper"   Erythromycin Base    Invokana [Canagliflozin] Other (See Comments)    Recurrent yeast infections   Irbesartan-Hydrochlorothiazide    Latex    Victoza [Liraglutide] Nausea Only   Ozempic (0.25 Or 0.5 Mg-Dose) [Semaglutide(0.25 Or 0.60m-Dos)] Nausea Only    Current Outpatient Medications on File Prior to Visit  Medication Sig Dispense Refill   amLODipine (NORVASC) 5 MG tablet TAKE 1 TABLET BY MOUTH EVERY DAY 90 tablet 0   BD PEN NEEDLE NANO 2ND GEN 32G X 4 MM MISC USE AS DIRECTED 400 each 3   betamethasone dipropionate 0.05 % cream      Continuous Blood Gluc Sensor (DEXCOM G6 SENSOR) MISC 1 Device by Does not apply route as directed. 3 each 11   Continuous Blood Gluc Transmit (DEXCOM G6 TRANSMITTER) MISC USE AS DIRECTED 1 each 3   furosemide (LASIX) 20 MG tablet Take 1 tablet (20 mg total) by mouth daily. 30 tablet 11   insulin glargine (LANTUS SOLOSTAR) 100 UNIT/ML Solostar Pen Inject 30 Units into the skin daily. 45 mL 3   insulin lispro (HUMALOG KWIKPEN) 200 UNIT/ML KwikPen Max Daily 120 30 mL 3   KLOR-CON M20 20 MEQ tablet TAKE 1 TABLET BY MOUTH EVERY DAY 90 tablet 1   lactulose (CHRONULAC) 10 GM/15ML solution Take 3 tablespoons daily. 236 mL 0   losartan  (COZAAR) 100 MG tablet TAKE 1 TABLET BY MOUTH EVERY DAY 90 tablet 1   metFORMIN (GLUCOPHAGE) 1000 MG tablet TAKE 1 TABLET BY MOUTH TWICE A DAY 180 tablet 3   mupirocin ointment (BACTROBAN) 2 % Apply 1 application topically 2 (two) times daily. 22 g 1   NONFORMULARY OR COMPOUNDED ITEM Antifungal-terbinafine-3%, fluconazole -2%, tea tree oil-5%,urea- 10%,in DMSO suspension # 335m apply to the affected nail(s) once (at bedtime) or twice daily.  1 each 0   ondansetron (ZOFRAN) 4 MG tablet Take 1 tablet (4 mg total) by mouth 2 (two) times daily as needed for nausea. 50 tablet 3   rosuvastatin (CRESTOR) 10 MG tablet Take 1 tablet (10 mg total) by mouth daily. 90 tablet 3   spironolactone (ALDACTONE) 50 MG tablet Take 1 tablet (50 mg total) by mouth daily. 30 tablet 11   Current Facility-Administered Medications on File Prior to Visit  Medication Dose Route Frequency Provider Last Rate Last Admin   TDaP (BOOSTRIX) injection 0.5 mL  0.5 mL Intramuscular Once Burchette, Alinda Sierras, MD        BP 120/60    Pulse 83    Temp 98.4 F (36.9 C) (Oral)    Ht 5' 3"  (1.6 m)    Wt 174 lb (78.9 kg)    SpO2 97%    BMI 30.82 kg/m       Objective:   Physical Exam Vitals and nursing note reviewed.  Constitutional:      Appearance: Normal appearance.  HENT:     Nose: Congestion present.     Right Turbinates: Swollen.     Left Turbinates: Swollen.     Right Sinus: Maxillary sinus tenderness and frontal sinus tenderness present.     Left Sinus: Maxillary sinus tenderness and frontal sinus tenderness present.  Cardiovascular:     Rate and Rhythm: Normal rate and regular rhythm.     Pulses: Normal pulses.     Heart sounds: Normal heart sounds.  Pulmonary:     Effort: Pulmonary effort is normal.     Breath sounds: Normal breath sounds.  Musculoskeletal:        General: Normal range of motion.  Skin:    General: Skin is warm and dry.  Neurological:     General: No focal deficit present.     Mental Status:  She is alert and oriented to person, place, and time.  Psychiatric:        Mood and Affect: Mood normal.        Behavior: Behavior normal.        Thought Content: Thought content normal.        Judgment: Judgment normal.       Assessment & Plan:  1. Acute non-recurrent pansinusitis -She does not do well with Augmentin as this causes diarrhea.  Has tried doxycycline in the past without good results.  We will trial her on azithromycin and Flonase. - azithromycin (ZITHROMAX) 250 MG tablet; Take 2 tablets on day 1, then 1 tablet daily on days 2 through 5  Dispense: 6 tablet; Refill: 0 - fluticasone (FLONASE) 50 MCG/ACT nasal spray; Place 2 sprays into both nostrils daily.  Dispense: 16 g; Refill: 6  2. Acute cough - appears to be from PND   Dorothyann Peng, NP

## 2022-01-18 DIAGNOSIS — H2513 Age-related nuclear cataract, bilateral: Secondary | ICD-10-CM | POA: Diagnosis not present

## 2022-01-18 DIAGNOSIS — H1032 Unspecified acute conjunctivitis, left eye: Secondary | ICD-10-CM | POA: Diagnosis not present

## 2022-01-18 DIAGNOSIS — H00015 Hordeolum externum left lower eyelid: Secondary | ICD-10-CM | POA: Diagnosis not present

## 2022-01-19 ENCOUNTER — Telehealth: Payer: Self-pay | Admitting: Family Medicine

## 2022-01-19 NOTE — Telephone Encounter (Signed)
Pt just finished zpak and now having vaginal itching and burning . Pt saw cory 01-14-2022. Pt would like diflucan sent to  [id number] [place] [place] [address]  [phone] Fax:  [phone]

## 2022-01-19 NOTE — Telephone Encounter (Signed)
Accidentally sent to PCP Please advise

## 2022-01-19 NOTE — Telephone Encounter (Signed)
Please advise 

## 2022-01-20 MED ORDER — FLUCONAZOLE 150 MG PO TABS: 150.0000 mg | ORAL_TABLET | Freq: Once | ORAL | 1 refills | Status: AC

## 2022-01-20 NOTE — Telephone Encounter (Signed)
Patient notified of update  and verbalized understanding.

## 2022-01-22 ENCOUNTER — Encounter: Payer: Self-pay | Admitting: Internal Medicine

## 2022-01-24 ENCOUNTER — Other Ambulatory Visit: Payer: Medicare PPO

## 2022-01-24 ENCOUNTER — Encounter: Payer: Self-pay | Admitting: Internal Medicine

## 2022-01-24 ENCOUNTER — Encounter: Payer: Self-pay | Admitting: Family Medicine

## 2022-01-24 DIAGNOSIS — E1165 Type 2 diabetes mellitus with hyperglycemia: Secondary | ICD-10-CM | POA: Diagnosis not present

## 2022-01-24 DIAGNOSIS — K7460 Unspecified cirrhosis of liver: Secondary | ICD-10-CM

## 2022-01-24 LAB — BASIC METABOLIC PANEL
BUN: 17 mg/dL (ref 6–23)
CO2: 26 mEq/L (ref 19–32)
Calcium: 10.3 mg/dL (ref 8.4–10.5)
Chloride: 91 mEq/L — ABNORMAL LOW (ref 96–112)
Creatinine, Ser: 0.9 mg/dL (ref 0.40–1.20)
GFR: 63.9 mL/min (ref >60.00)
Glucose, Bld: 175 mg/dL — ABNORMAL HIGH (ref 70–99)
Potassium: 4.7 mEq/L (ref 3.5–5.1)
Sodium: 125 mEq/L — ABNORMAL LOW (ref 135–145)

## 2022-01-24 NOTE — Telephone Encounter (Signed)
noted 

## 2022-01-26 ENCOUNTER — Telehealth (INDEPENDENT_AMBULATORY_CARE_PROVIDER_SITE_OTHER): Payer: Medicare PPO | Admitting: Family Medicine

## 2022-01-26 ENCOUNTER — Encounter: Payer: Self-pay | Admitting: Gastroenterology

## 2022-01-26 ENCOUNTER — Other Ambulatory Visit: Payer: Self-pay

## 2022-01-26 DIAGNOSIS — R058 Other specified cough: Secondary | ICD-10-CM | POA: Diagnosis not present

## 2022-01-26 DIAGNOSIS — N819 Female genital prolapse, unspecified: Secondary | ICD-10-CM | POA: Diagnosis not present

## 2022-01-26 DIAGNOSIS — F339 Major depressive disorder, recurrent, unspecified: Secondary | ICD-10-CM | POA: Diagnosis not present

## 2022-01-26 DIAGNOSIS — K7460 Unspecified cirrhosis of liver: Secondary | ICD-10-CM

## 2022-01-26 DIAGNOSIS — Z01419 Encounter for gynecological examination (general) (routine) without abnormal findings: Secondary | ICD-10-CM | POA: Diagnosis not present

## 2022-01-26 DIAGNOSIS — N760 Acute vaginitis: Secondary | ICD-10-CM | POA: Diagnosis not present

## 2022-01-26 DIAGNOSIS — D649 Anemia, unspecified: Secondary | ICD-10-CM | POA: Diagnosis not present

## 2022-01-26 DIAGNOSIS — R29890 Loss of height: Secondary | ICD-10-CM | POA: Diagnosis not present

## 2022-01-26 DIAGNOSIS — N958 Other specified menopausal and perimenopausal disorders: Secondary | ICD-10-CM | POA: Diagnosis not present

## 2022-01-26 NOTE — Progress Notes (Signed)
Patient ID: Dana Dyer, female   DOB: 25-Jul-1951, 71 y.o.   MRN: [id number]   This visit type was conducted due to national recommendations for restrictions regarding the COVID-19 pandemic in an effort to limit this patient's exposure and mitigate transmission in our community.   Virtual Visit via Video Note  I connected with Dana Dyer on 01/26/22 at  7:45 AM EST by a video enabled telemedicine application and verified that I am speaking with the correct person using two identifiers.  Location patient: home Location provider:work or home office Persons participating in the virtual visit: patient, provider  I discussed the limitations of evaluation and management by telemedicine and the availability of in person appointments. The patient expressed understanding and agreed to proceed.   HPI:  She has sent a fairly long message that outlines some recent concerns she had for depression symptoms.  She had prior history of depression and treated with various things.  She took Cymbalta for a while but felt like this made her more jittery.  She also took Wellbutrin for a while and also Lexapro.  She has had a fairly favorable response with Lexapro in the past.  She states she has been more irritable recently.  She has some sleep disturbance with this has been chronic for most of her life.  She states that she has recently had low threshold to cry and feel emotional with things for example TV.  She has on the positive side started engaging more in hobbies such as cooking and reading.  She feels her energy levels are better overall.  She basically had called requesting going back on Lexapro possibly.  No suspicion for bipolar.  No family history of bipolar.  Other issues chronic cough.  She had recent chest x-ray on February 8 which was normal.  She had recent treatment with Zithromax.  She is taking hydrocodone cough syrup at night which is only thing that seems to suppress this.  She has tried  antihistamines and nasal sprays without improvement.  Does have history of reflux and currently not on PPI.  No hemoptysis.  No fever.  No chills.  No dyspnea.   ROS: See pertinent positives and negatives per HPI.  Past Medical History:  Diagnosis Date   ALLERGIC RHINITIS 10/09/2007   Allergy    Arthritis    hips, spine   DIABETES MELLITUS, UNCONTROLLED 03/16/2009   FATIGUE 10/21/2008   FIBROMYALGIA 10/09/2007   GERD (gastroesophageal reflux disease)    diet  controlled, no meds   H/O vaginal hysterectomy    HYPERLIPIDEMIA 10/09/2007   diet controlled, no meds   HYPERTENSION 10/09/2007   Morton's neuroma    OBSTRUCTIVE SLEEP APNEA 10/09/2007   Plantar fasciitis    History - right   Sleep apnea    Uses CPAP   TRANSAMINASES, SERUM, ELEVATED 07/08/2009    Past Surgical History:  Procedure Laterality Date   ABDOMINAL HYSTERECTOMY  1979   prolapsed uterus   BASAL CELL CARCINOMA EXCISION     BREAST BIOPSY Left 2012   left 2012 stereo   BREAST REDUCTION SURGERY Bilateral 1992   COLONOSCOPY  2006   [facility]   mamoplasty     reduction   REDUCTION MAMMAPLASTY Bilateral 1992   TONSILLECTOMY  1959   TUBAL LIGATION  1976    Family History  Problem Relation Age of Onset   Diabetes Mother    Arthritis Mother    Cancer Mother        breast  Breast cancer Mother 67   Alcohol abuse Father    Arthritis Father    Hypertension Maternal Grandfather    Miscarriages / Stillbirths Paternal Grandfather    Breast cancer Maternal Aunt        diagnosed in her 76's   Colon polyps Neg Hx    Colon cancer Neg Hx    Esophageal cancer Neg Hx    Rectal cancer Neg Hx    Stomach cancer Neg Hx     SOCIAL HX: Never smoked   Current Outpatient Medications:    amLODipine (NORVASC) 5 MG tablet, TAKE 1 TABLET BY MOUTH EVERY DAY, Disp: 90 tablet, Rfl: 0   BD PEN NEEDLE NANO 2ND GEN 32G X 4 MM MISC, USE AS DIRECTED, Disp: 400 each, Rfl: 3   betamethasone dipropionate 0.05 % cream, , Disp: , Rfl:     Continuous Blood Gluc Sensor (DEXCOM G6 SENSOR) MISC, 1 Device by Does not apply route as directed., Disp: 3 each, Rfl: 11   Continuous Blood Gluc Transmit (DEXCOM G6 TRANSMITTER) MISC, USE AS DIRECTED, Disp: 1 each, Rfl: 3   fluticasone (FLONASE) 50 MCG/ACT nasal spray, Place 2 sprays into both nostrils daily., Disp: 16 g, Rfl: 6   furosemide (LASIX) 20 MG tablet, Take 1 tablet (20 mg total) by mouth daily., Disp: 30 tablet, Rfl: 11   insulin glargine (LANTUS SOLOSTAR) 100 UNIT/ML Solostar Pen, Inject 30 Units into the skin daily., Disp: 45 mL, Rfl: 3   insulin lispro (HUMALOG KWIKPEN) 200 UNIT/ML KwikPen, Max Daily 120, Disp: 30 mL, Rfl: 3   KLOR-CON M20 20 MEQ tablet, TAKE 1 TABLET BY MOUTH EVERY DAY, Disp: 90 tablet, Rfl: 1   lactulose (CHRONULAC) 10 GM/15ML solution, Take 3 tablespoons daily., Disp: 236 mL, Rfl: 0   losartan (COZAAR) 100 MG tablet, TAKE 1 TABLET BY MOUTH EVERY DAY, Disp: 90 tablet, Rfl: 1   metFORMIN (GLUCOPHAGE) 1000 MG tablet, TAKE 1 TABLET BY MOUTH TWICE A DAY, Disp: 180 tablet, Rfl: 3   mupirocin ointment (BACTROBAN) 2 %, Apply 1 application topically 2 (two) times daily., Disp: 22 g, Rfl: 1   NONFORMULARY OR COMPOUNDED ITEM, Antifungal-terbinafine-3%, fluconazole -2%, tea tree oil-5%,urea- 10%,in DMSO suspension # 34m, apply to the affected nail(s) once (at bedtime) or twice daily., Disp: 1 each, Rfl: 0   ondansetron (ZOFRAN) 4 MG tablet, Take 1 tablet (4 mg total) by mouth 2 (two) times daily as needed for nausea., Disp: 50 tablet, Rfl: 3   rosuvastatin (CRESTOR) 10 MG tablet, Take 1 tablet (10 mg total) by mouth daily., Disp: 90 tablet, Rfl: 3   spironolactone (ALDACTONE) 50 MG tablet, Take 1 tablet (50 mg total) by mouth daily., Disp: 30 tablet, Rfl: 11  Current Facility-Administered Medications:    TDaP (BOOSTRIX) injection 0.5 mL, 0.5 mL, Intramuscular, Once, , BAlinda Sierras MD  EXAM:  VITALS per patient if applicable:  GENERAL: alert, oriented, appears  well and in no acute distress  HEENT: atraumatic, conjunttiva clear, no obvious abnormalities on inspection of external nose and ears  NECK: normal movements of the head and neck  LUNGS: on inspection no signs of respiratory distress, breathing rate appears normal, no obvious gross SOB, gasping or wheezing  CV: no obvious cyanosis  MS: moves all visible extremities without noticeable abnormality  PSYCH/NEURO: pleasant and cooperative, no obvious depression or anxiety, speech and thought processing grossly intact  ASSESSMENT AND PLAN:  Discussed the following assessment and plan:  #1 history of recurrent depression.  Patient denies  any suicidal ideation.  She has responded favorably to Lexapro in the past.  We discussed starting back Lexapro 10 mg daily and give feedback in 3 to 4 weeks. Continue to engage in hobbies.  Consider counseling if symptoms persist  #2 chronic cough.  Suspect upper airway cough syndrome.  Recent chest x-ray unremarkable.  We did recommend consider elevate head of bed several inches and also get back on daily PPI for now.  Be in touch if cough not improving next few weeks.     I discussed the assessment and treatment plan with the patient. The patient was provided an opportunity to ask questions and all were answered. The patient agreed with the plan and demonstrated an understanding of the instructions.   The patient was advised to call back or seek an in-person evaluation if the symptoms worsen or if the condition fails to improve as anticipated.     Carolann Littler, MD

## 2022-01-27 ENCOUNTER — Telehealth: Payer: Self-pay | Admitting: Family Medicine

## 2022-01-27 NOTE — Telephone Encounter (Signed)
Patient called in stating that Dr.Burchette was supposed to call in a prescript ion for Lexapro into [id number]- [place], [place] [facility] [address] [id number].  Patient stated that she just left the pharmacy and it was not there.  Please advise.

## 2022-01-28 ENCOUNTER — Other Ambulatory Visit (INDEPENDENT_AMBULATORY_CARE_PROVIDER_SITE_OTHER): Payer: Medicare PPO

## 2022-01-28 DIAGNOSIS — K7460 Unspecified cirrhosis of liver: Secondary | ICD-10-CM

## 2022-01-28 LAB — BASIC METABOLIC PANEL
BUN: 20 mg/dL (ref 6–23)
CO2: 26 mEq/L (ref 19–32)
Calcium: 9.6 mg/dL (ref 8.4–10.5)
Chloride: 93 mEq/L — ABNORMAL LOW (ref 96–112)
Creatinine, Ser: 0.92 mg/dL (ref 0.40–1.20)
GFR: 62.23 mL/min (ref >60.00)
Glucose, Bld: 291 mg/dL — ABNORMAL HIGH (ref 70–99)
Potassium: 4.6 mEq/L (ref 3.5–5.1)
Sodium: 125 mEq/L — ABNORMAL LOW (ref 135–145)

## 2022-01-28 MED ORDER — ESCITALOPRAM OXALATE 10 MG PO TABS: 10.0000 mg | ORAL_TABLET | Freq: Every day | ORAL | 5 refills | Status: DC

## 2022-01-28 NOTE — Telephone Encounter (Signed)
Lexapro has been sent.

## 2022-01-28 NOTE — Telephone Encounter (Signed)
Noted  

## 2022-02-01 ENCOUNTER — Encounter: Payer: Self-pay | Admitting: Family Medicine

## 2022-02-01 ENCOUNTER — Other Ambulatory Visit: Payer: Self-pay

## 2022-02-01 ENCOUNTER — Encounter: Payer: Self-pay | Admitting: Gastroenterology

## 2022-02-01 ENCOUNTER — Other Ambulatory Visit: Payer: Self-pay | Admitting: Gastroenterology

## 2022-02-01 ENCOUNTER — Other Ambulatory Visit: Payer: Self-pay | Admitting: Family Medicine

## 2022-02-01 DIAGNOSIS — K7460 Unspecified cirrhosis of liver: Secondary | ICD-10-CM

## 2022-02-01 DIAGNOSIS — E876 Hypokalemia: Secondary | ICD-10-CM

## 2022-02-01 DIAGNOSIS — I10 Essential (primary) hypertension: Secondary | ICD-10-CM

## 2022-02-01 MED ORDER — AMLODIPINE BESYLATE 5 MG PO TABS: 5.0000 mg | ORAL_TABLET | Freq: Every day | ORAL | 0 refills | Status: DC

## 2022-02-02 ENCOUNTER — Other Ambulatory Visit: Payer: Self-pay

## 2022-02-02 MED ORDER — LACTULOSE 10 GM/15ML PO SOLN: ORAL | 3 refills | Status: DC

## 2022-02-03 ENCOUNTER — Ambulatory Visit: Payer: Medicare PPO | Admitting: Internal Medicine

## 2022-02-03 ENCOUNTER — Other Ambulatory Visit: Payer: Self-pay | Admitting: Gastroenterology

## 2022-02-07 ENCOUNTER — Other Ambulatory Visit (INDEPENDENT_AMBULATORY_CARE_PROVIDER_SITE_OTHER): Payer: Medicare PPO

## 2022-02-07 DIAGNOSIS — K7460 Unspecified cirrhosis of liver: Secondary | ICD-10-CM

## 2022-02-07 LAB — BASIC METABOLIC PANEL
BUN: 14 mg/dL (ref 6–23)
CO2: 22 mEq/L (ref 19–32)
Calcium: 9.4 mg/dL (ref 8.4–10.5)
Chloride: 97 mEq/L (ref 96–112)
Creatinine, Ser: 0.71 mg/dL (ref 0.40–1.20)
GFR: 84.92 mL/min (ref >60.00)
Glucose, Bld: 169 mg/dL — ABNORMAL HIGH (ref 70–99)
Potassium: 4.9 mEq/L (ref 3.5–5.1)
Sodium: 127 mEq/L — ABNORMAL LOW (ref 135–145)

## 2022-02-10 ENCOUNTER — Other Ambulatory Visit: Payer: Self-pay | Admitting: Family Medicine

## 2022-02-14 DIAGNOSIS — M9904 Segmental and somatic dysfunction of sacral region: Secondary | ICD-10-CM | POA: Diagnosis not present

## 2022-02-14 DIAGNOSIS — M9901 Segmental and somatic dysfunction of cervical region: Secondary | ICD-10-CM | POA: Diagnosis not present

## 2022-02-14 DIAGNOSIS — M9902 Segmental and somatic dysfunction of thoracic region: Secondary | ICD-10-CM | POA: Diagnosis not present

## 2022-02-14 DIAGNOSIS — M9903 Segmental and somatic dysfunction of lumbar region: Secondary | ICD-10-CM | POA: Diagnosis not present

## 2022-02-15 ENCOUNTER — Encounter: Payer: Self-pay | Admitting: Gastroenterology

## 2022-02-15 ENCOUNTER — Ambulatory Visit: Payer: Medicare PPO | Admitting: Gastroenterology

## 2022-02-15 VITALS — BP 118/50 | HR 80 | Ht 63.0 in | Wt 174.5 lb

## 2022-02-15 DIAGNOSIS — Z86010 Personal history of colonic polyps: Secondary | ICD-10-CM

## 2022-02-15 DIAGNOSIS — K7460 Unspecified cirrhosis of liver: Secondary | ICD-10-CM

## 2022-02-15 DIAGNOSIS — M9904 Segmental and somatic dysfunction of sacral region: Secondary | ICD-10-CM | POA: Diagnosis not present

## 2022-02-15 DIAGNOSIS — M9902 Segmental and somatic dysfunction of thoracic region: Secondary | ICD-10-CM | POA: Diagnosis not present

## 2022-02-15 DIAGNOSIS — M9901 Segmental and somatic dysfunction of cervical region: Secondary | ICD-10-CM | POA: Diagnosis not present

## 2022-02-15 DIAGNOSIS — M9903 Segmental and somatic dysfunction of lumbar region: Secondary | ICD-10-CM | POA: Diagnosis not present

## 2022-02-15 DIAGNOSIS — Z8719 Personal history of other diseases of the digestive system: Secondary | ICD-10-CM

## 2022-02-15 MED ORDER — LACTULOSE 10 GM/15ML PO SOLN: ORAL | 3 refills | Status: DC

## 2022-02-15 MED ORDER — PEG 3350-KCL-NA BICARB-NACL 420 G PO SOLR: 4000.0000 mL | Freq: Once | ORAL | 0 refills | Status: AC

## 2022-02-15 NOTE — Patient Instructions (Addendum)
If you are age 73 or older, your body mass index should be between 23-30. Your Body mass index is 30.91 kg/m?Marland Kitchen If this is out of the aforementioned range listed, please consider follow up with your Primary Care Provider. ?________________________________________________________ ? ?The [facility] providers would like to encourage you to use [facility] to communicate with providers for non-urgent requests or questions.  Due to long hold times on the telephone, sending your provider a message by [facility] may be a faster and more efficient way to get a response.  Please allow 48 business hours for a response.  Please remember that this is for non-urgent requests.  ?_______________________________________________________ ? ?REDUCE: Lactulose to 3 tablespoons daily. ? ?Your provider has requested that you go to the basement level for lab work in 3 weeks (03-08-22). Press "B" on the elevator. The lab is located at the first door on the left as you exit the elevator.  ? ?You have been scheduled for an endoscopy and colonoscopy. Please follow the written instructions given to you at your visit today. ?Please pick up your prep supplies at the pharmacy within the next 1-3 days. ?If you use inhalers (even only as needed), please bring them with you on the day of your procedure. ? ?You will need a follow up in 3 months (June 2023).  We will contact you to schedule this appointment.  ? ?Due to recent changes in healthcare laws, you may see the results of your imaging and laboratory studies on MyChart before your provider has had a chance to review them.  We understand that in some cases there may be results that are confusing or concerning to you. Not all laboratory results come back in the same time frame and the provider may be waiting for multiple results in order to interpret others.  Please give [place] 48 hours in order for your provider to thoroughly review all the results before contacting the office for clarification of your  results.  ? ?Thank you for entrusting me with your care and choosing [facility]. ? ?Dr Ardis Hughs ? ?

## 2022-02-15 NOTE — Progress Notes (Signed)
Review of pertinent gastrointestinal problems: ?1.  History of adenomatous polyps in her colon.  Colonoscopy 2006 Dr. Timmothy Euler found no polyps.  Colonoscopy Dr. Ardis Hughs December 2016 found 7 subcentimeter polyps.  These were all adenomatous polyps on pathology.  Also documented left-sided diverticulosis.  I recommend she have a repeat colonoscopy at 3-year interval.   Colonoscopy January 2020 four polyps were removed from her colon, all were adenomatous on pathology.  She was recommended to have repeat colonoscopy at 3-year interval ?2. Cirrhosis, likely from longstanding fatty liver disease, out-of-control diabetes.  Diagnosed 2019.  Lab workup 2019: Hep C Ab negative, Hep B Surface Ag negative, hepatitis B surface antibody negative, hep B Core Igm negative, Hep A IgM negative. Ferritin normal, HIV negative, ANA negative, antimitochondrial antibody negative, hepatitis a antibody total negative; slightly low platelets, transaminases slightly elevated.  No edema. ?[place] 07/2018: slightly nodular, suspicious for cirrhosis. [place] 12/2019 cirrhosis without mass lesions.  Ultrasound 08/2020 showed cirrhosis without focal mass lesions.  Gallbladder thick but without Murphy sign.  Ultrasound May 2022 showed cirrhosis, trace perihepatic ascites, no focal lesions in the liver. [place] 12/2021 nodular liver, no masses.  ?EGD 12/2018 small distal esophagus varices, mild portal gastropathy, no gastric varices.  She was started on nadolol (eventually at 108m daily) ?AFP 11/2021 normal ?MELD 12 (11/2021 labs) ?Hepatitis A, B vaccination series started 11/2018 ?Mild encephalopathy, memory disturbance, slight tremors, ammonia 65 11/2021.  Started lactulose which might have raised her blood sugars a bit. ? ?HPI: ?This is a very pleasant 71year old woman whom I last saw about 2 months ago here in the office ? ?Her weight is up 2 pounds since her last office visit here 2 months ago. ? ?Been following her basic metabolic profile, electrolytes fairly  closely over the past 3 to 4 weeks.  Her sodium has been low, 125 and most recently 127, creatinine most recently 0.7. ? ?[place]  This was somewhat out of concerns of being out of the [place] healthcare system in the event of any decompensation of her liver disease. ? ?I asked that she stop her Lasix a week or so ago to see how her sodium would react and she has gained 8 pounds since then. ? ?She has 1 to sometimes 2 solid BMs once daily.  This is with 6 tablespoons of lactulose daily ? ?She continues to feel that she has significantly bad breath ? ?She has had no overt GI bleeding ? ?ROS: complete GI ROS as described in HPI, all other review negative. ? ?Constitutional:  No unintentional weight loss ? ? ?Past Medical History:  ?Diagnosis Date  ? ALLERGIC RHINITIS 10/09/2007  ? Allergy   ? Arthritis   ? hips, spine  ? DIABETES MELLITUS, UNCONTROLLED 03/16/2009  ? FATIGUE 10/21/2008  ? FIBROMYALGIA 10/09/2007  ? GERD (gastroesophageal reflux disease)   ? diet  controlled, no meds  ? H/O vaginal hysterectomy   ? HYPERLIPIDEMIA 10/09/2007  ? diet controlled, no meds  ? HYPERTENSION 10/09/2007  ? Morton's neuroma   ? OBSTRUCTIVE SLEEP APNEA 10/09/2007  ? Plantar fasciitis   ? History - right  ? Sleep apnea   ? Uses CPAP  ? TRANSAMINASES, SERUM, ELEVATED 07/08/2009  ? ? ?Past Surgical History:  ?Procedure Laterality Date  ? ABDOMINAL HYSTERECTOMY  1979  ? prolapsed uterus  ? BASAL CELL CARCINOMA EXCISION    ? BREAST BIOPSY Left 2012  ? left 2012 stereo  ?  BREAST REDUCTION SURGERY Bilateral 1992  ? COLONOSCOPY  2006  ? Timmothy Euler  ? mamoplasty    ? reduction  ? REDUCTION MAMMAPLASTY Bilateral 1992  ? TONSILLECTOMY  1959  ? TUBAL LIGATION  1976  ? ? ?Current Outpatient Medications  ?Medication Instructions  ? amLODipine (NORVASC) 5 mg, Oral, Daily  ? BD PEN NEEDLE NANO 2ND GEN 32G X 4 MM MISC USE AS DIRECTED  ? Continuous Blood Gluc Sensor (DEXCOM G6 SENSOR) MISC 1  Device, Does not apply, As directed  ? Continuous Blood Gluc Transmit (DEXCOM G6 TRANSMITTER) MISC USE AS DIRECTED  ? escitalopram (LEXAPRO) 10 mg, Oral, Daily  ? famotidine (PEPCID) 20 mg, Oral, Daily at bedtime  ? insulin lispro (HUMALOG KWIKPEN) 200 UNIT/ML KwikPen Max Daily 120  ? KLOR-CON M20 20 MEQ tablet TAKE 1 TABLET BY MOUTH EVERY DAY  ? lactulose (CHRONULAC) 10 GM/15ML solution Take 6 tablespoons daily  ? Lantus SoloStar 30 Units, Subcutaneous, Daily  ? losartan (COZAAR) 100 MG tablet TAKE 1 TABLET BY MOUTH EVERY DAY  ? metFORMIN (GLUCOPHAGE) 1000 MG tablet TAKE 1 TABLET BY MOUTH TWICE A DAY  ? mupirocin ointment (BACTROBAN) 2 % 1 application., Topical, 2 times daily  ? NONFORMULARY OR COMPOUNDED ITEM Antifungal-terbinafine-3%, fluconazole -2%, tea tree oil-5%,urea- 10%,in DMSO suspension # 4m, apply to the affected nail(s) once (at bedtime) or twice daily.  ? ondansetron (ZOFRAN) 4 mg, Oral, 2 times daily PRN  ? spironolactone (ALDACTONE) 50 mg, Oral, Daily  ? ? ?Allergies as of 02/15/2022 - Review Complete 02/15/2022  ?Allergen Reaction Noted  ? Amoxicillin-pot clavulanate  03/09/2012  ? Codeine sulfate  03/16/2009  ? Erythromycin base  04/07/2009  ? Invokana [canagliflozin] Other (See Comments) 05/18/2015  ? Irbesartan-hydrochlorothiazide  04/07/2009  ? Latex    ? Victoza [liraglutide] Nausea Only 03/10/2016  ? Ozempic (0.25 or 0.5 mg-dose) [semaglutide(0.25 or 0.59mdos)] Nausea Only 09/09/2019  ? ? ?Family History  ?Problem Relation Age of Onset  ? Diabetes Mother   ? Arthritis Mother   ? Cancer Mother   ?     breast  ? Breast cancer Mother [id number]? Alcohol abuse Father   ? Arthritis Father   ? Hypertension Maternal Grandfather   ? Miscarriages / Stillbirths Paternal Grandfather   ? Breast cancer Maternal Aunt   ?     diagnosed in her 707's? Colon polyps Neg Hx   ? Colon cancer Neg Hx   ? Esophageal cancer Neg Hx   ? Rectal cancer Neg Hx   ? Stomach cancer Neg Hx   ? ? ?Social History   ? ?Socioeconomic History  ? Marital status: Married  ?  Spouse name: Not on file  ? Number of children: Not on file  ? Years of education: Not on file  ? Highest education level: Not on file  ?Occupational History  ? Not on file  ?Tobacco Use  ? Smoking status: Never  ? Smokeless tobacco: Never  ?Vaping Use  ? Vaping Use: Never used  ?Substance and Sexual Activity  ? Alcohol use: No  ?  Alcohol/week: 0.0 standard drinks  ? Drug use: No  ? Sexual activity: Yes  ?  Birth control/protection: Other-see comments, Post-menopausal  ?  Comment: Hysterectomy  ?Other Topics Concern  ? Not on file  ?Social History Narrative  ? Not on file  ? ?Social Determinants of Health  ? ?Financial Resource Strain: Low Risk   ? Difficulty of Paying Living Expenses: Not hard at  all  ?Food Insecurity: No Food Insecurity  ? Worried About Charity fundraiser in the Last Year: Never true  ? Ran Out of Food in the Last Year: Never true  ?Transportation Needs: No Transportation Needs  ? Lack of Transportation (Medical): No  ? Lack of Transportation (Non-Medical): No  ?Physical Activity: Insufficiently Active  ? Days of Exercise per Week: 3 days  ? Minutes of Exercise per Session: 30 min  ?Stress: No Stress Concern Present  ? Feeling of Stress : Not at all  ?Social Connections: Unknown  ? Frequency of Communication with Friends and Family: Three times a week  ? Frequency of Social Gatherings with Friends and Family: Not on file  ? Attends Religious Services: Not on file  ? Active Member of Clubs or Organizations: No  ? Attends Archivist Meetings: Never  ? Marital Status: Married  ?Intimate Partner Violence: Not At Risk  ? Fear of Current or Ex-Partner: No  ? Emotionally Abused: No  ? Physically Abused: No  ? Sexually Abused: No  ? ? ? ?Physical Exam: ?Wt 174 lb 8 oz (79.2 kg)   BMI 30.91 kg/m?  ?Constitutional: generally well-appearing ?Psychiatric: alert and oriented x3 ?Abdomen: soft, nontender, nondistended, no obvious ascites, no  peritoneal signs, normal bowel sounds ?No peripheral edema noted in lower extremities ? ?Assessment and plan: ?71 y.o. female with Dana Dyer cirrhosis, history of colon polyps ? ?She is up-to-date on hepatoma screening.  I ha

## 2022-02-23 ENCOUNTER — Other Ambulatory Visit: Payer: Self-pay | Admitting: Family Medicine

## 2022-02-23 ENCOUNTER — Encounter: Payer: Self-pay | Admitting: Gastroenterology

## 2022-02-23 DIAGNOSIS — M9901 Segmental and somatic dysfunction of cervical region: Secondary | ICD-10-CM | POA: Diagnosis not present

## 2022-02-23 DIAGNOSIS — M9903 Segmental and somatic dysfunction of lumbar region: Secondary | ICD-10-CM | POA: Diagnosis not present

## 2022-02-23 DIAGNOSIS — M9902 Segmental and somatic dysfunction of thoracic region: Secondary | ICD-10-CM | POA: Diagnosis not present

## 2022-02-23 DIAGNOSIS — M9904 Segmental and somatic dysfunction of sacral region: Secondary | ICD-10-CM | POA: Diagnosis not present

## 2022-02-24 DIAGNOSIS — M9902 Segmental and somatic dysfunction of thoracic region: Secondary | ICD-10-CM | POA: Diagnosis not present

## 2022-02-24 DIAGNOSIS — M9901 Segmental and somatic dysfunction of cervical region: Secondary | ICD-10-CM | POA: Diagnosis not present

## 2022-02-24 DIAGNOSIS — M9904 Segmental and somatic dysfunction of sacral region: Secondary | ICD-10-CM | POA: Diagnosis not present

## 2022-02-24 DIAGNOSIS — M9903 Segmental and somatic dysfunction of lumbar region: Secondary | ICD-10-CM | POA: Diagnosis not present

## 2022-02-25 DIAGNOSIS — M9901 Segmental and somatic dysfunction of cervical region: Secondary | ICD-10-CM | POA: Diagnosis not present

## 2022-02-25 DIAGNOSIS — M9903 Segmental and somatic dysfunction of lumbar region: Secondary | ICD-10-CM | POA: Diagnosis not present

## 2022-02-25 DIAGNOSIS — M9902 Segmental and somatic dysfunction of thoracic region: Secondary | ICD-10-CM | POA: Diagnosis not present

## 2022-02-25 DIAGNOSIS — M9904 Segmental and somatic dysfunction of sacral region: Secondary | ICD-10-CM | POA: Diagnosis not present

## 2022-02-26 ENCOUNTER — Encounter: Payer: Self-pay | Admitting: Family Medicine

## 2022-03-01 DIAGNOSIS — M9904 Segmental and somatic dysfunction of sacral region: Secondary | ICD-10-CM | POA: Diagnosis not present

## 2022-03-01 DIAGNOSIS — M9903 Segmental and somatic dysfunction of lumbar region: Secondary | ICD-10-CM | POA: Diagnosis not present

## 2022-03-01 DIAGNOSIS — M9902 Segmental and somatic dysfunction of thoracic region: Secondary | ICD-10-CM | POA: Diagnosis not present

## 2022-03-01 DIAGNOSIS — M9901 Segmental and somatic dysfunction of cervical region: Secondary | ICD-10-CM | POA: Diagnosis not present

## 2022-03-02 DIAGNOSIS — M9902 Segmental and somatic dysfunction of thoracic region: Secondary | ICD-10-CM | POA: Diagnosis not present

## 2022-03-02 DIAGNOSIS — M9903 Segmental and somatic dysfunction of lumbar region: Secondary | ICD-10-CM | POA: Diagnosis not present

## 2022-03-02 DIAGNOSIS — M9904 Segmental and somatic dysfunction of sacral region: Secondary | ICD-10-CM | POA: Diagnosis not present

## 2022-03-02 DIAGNOSIS — M9901 Segmental and somatic dysfunction of cervical region: Secondary | ICD-10-CM | POA: Diagnosis not present

## 2022-03-03 DIAGNOSIS — M9901 Segmental and somatic dysfunction of cervical region: Secondary | ICD-10-CM | POA: Diagnosis not present

## 2022-03-03 DIAGNOSIS — M9903 Segmental and somatic dysfunction of lumbar region: Secondary | ICD-10-CM | POA: Diagnosis not present

## 2022-03-03 DIAGNOSIS — M9904 Segmental and somatic dysfunction of sacral region: Secondary | ICD-10-CM | POA: Diagnosis not present

## 2022-03-03 DIAGNOSIS — M9902 Segmental and somatic dysfunction of thoracic region: Secondary | ICD-10-CM | POA: Diagnosis not present

## 2022-03-07 DIAGNOSIS — M9903 Segmental and somatic dysfunction of lumbar region: Secondary | ICD-10-CM | POA: Diagnosis not present

## 2022-03-07 DIAGNOSIS — M9901 Segmental and somatic dysfunction of cervical region: Secondary | ICD-10-CM | POA: Diagnosis not present

## 2022-03-07 DIAGNOSIS — M9904 Segmental and somatic dysfunction of sacral region: Secondary | ICD-10-CM | POA: Diagnosis not present

## 2022-03-07 DIAGNOSIS — M9902 Segmental and somatic dysfunction of thoracic region: Secondary | ICD-10-CM | POA: Diagnosis not present

## 2022-03-08 ENCOUNTER — Other Ambulatory Visit (INDEPENDENT_AMBULATORY_CARE_PROVIDER_SITE_OTHER): Payer: Medicare PPO

## 2022-03-08 DIAGNOSIS — K7460 Unspecified cirrhosis of liver: Secondary | ICD-10-CM

## 2022-03-08 DIAGNOSIS — M9904 Segmental and somatic dysfunction of sacral region: Secondary | ICD-10-CM | POA: Diagnosis not present

## 2022-03-08 DIAGNOSIS — M9902 Segmental and somatic dysfunction of thoracic region: Secondary | ICD-10-CM | POA: Diagnosis not present

## 2022-03-08 DIAGNOSIS — H04123 Dry eye syndrome of bilateral lacrimal glands: Secondary | ICD-10-CM | POA: Diagnosis not present

## 2022-03-08 DIAGNOSIS — H10413 Chronic giant papillary conjunctivitis, bilateral: Secondary | ICD-10-CM | POA: Diagnosis not present

## 2022-03-08 DIAGNOSIS — M9901 Segmental and somatic dysfunction of cervical region: Secondary | ICD-10-CM | POA: Diagnosis not present

## 2022-03-08 DIAGNOSIS — H02831 Dermatochalasis of right upper eyelid: Secondary | ICD-10-CM | POA: Diagnosis not present

## 2022-03-08 DIAGNOSIS — M9903 Segmental and somatic dysfunction of lumbar region: Secondary | ICD-10-CM | POA: Diagnosis not present

## 2022-03-08 DIAGNOSIS — H02834 Dermatochalasis of left upper eyelid: Secondary | ICD-10-CM | POA: Diagnosis not present

## 2022-03-08 LAB — BASIC METABOLIC PANEL
BUN: 29 mg/dL — ABNORMAL HIGH (ref 6–23)
CO2: 21 mEq/L (ref 19–32)
Calcium: 10.2 mg/dL (ref 8.4–10.5)
Chloride: 97 mEq/L (ref 96–112)
Creatinine, Ser: 0.93 mg/dL (ref 0.40–1.20)
GFR: 61.39 mL/min (ref >60.00)
Glucose, Bld: 90 mg/dL (ref 70–99)
Potassium: 5.8 mEq/L — ABNORMAL HIGH (ref 3.5–5.1)
Sodium: 127 mEq/L — ABNORMAL LOW (ref 135–145)

## 2022-03-09 ENCOUNTER — Other Ambulatory Visit: Payer: Self-pay

## 2022-03-09 DIAGNOSIS — E876 Hypokalemia: Secondary | ICD-10-CM

## 2022-03-09 MED ORDER — SPIRONOLACTONE 50 MG PO TABS: 50.0000 mg | ORAL_TABLET | ORAL | 11 refills | Status: DC

## 2022-03-09 MED ORDER — FUROSEMIDE 20 MG PO TABS: 20.0000 mg | ORAL_TABLET | ORAL | Status: DC

## 2022-03-09 NOTE — Progress Notes (Signed)
Patient notified of lab results per Dr Ardis Hughs.  Patient advised to continue furosemide 35m every other day and change Aldactone 57mto every other day.  She will recheck labs prior to appointment on 03-30-22.  Patient agreed to plan and verbalized understanding.  No further questions.  ?

## 2022-03-10 ENCOUNTER — Encounter: Payer: Self-pay | Admitting: Gastroenterology

## 2022-03-11 DIAGNOSIS — M9901 Segmental and somatic dysfunction of cervical region: Secondary | ICD-10-CM | POA: Diagnosis not present

## 2022-03-11 DIAGNOSIS — M9904 Segmental and somatic dysfunction of sacral region: Secondary | ICD-10-CM | POA: Diagnosis not present

## 2022-03-11 DIAGNOSIS — M9903 Segmental and somatic dysfunction of lumbar region: Secondary | ICD-10-CM | POA: Diagnosis not present

## 2022-03-11 DIAGNOSIS — M9902 Segmental and somatic dysfunction of thoracic region: Secondary | ICD-10-CM | POA: Diagnosis not present

## 2022-03-14 DIAGNOSIS — M9904 Segmental and somatic dysfunction of sacral region: Secondary | ICD-10-CM | POA: Diagnosis not present

## 2022-03-14 DIAGNOSIS — M9902 Segmental and somatic dysfunction of thoracic region: Secondary | ICD-10-CM | POA: Diagnosis not present

## 2022-03-14 DIAGNOSIS — M9901 Segmental and somatic dysfunction of cervical region: Secondary | ICD-10-CM | POA: Diagnosis not present

## 2022-03-14 DIAGNOSIS — M9903 Segmental and somatic dysfunction of lumbar region: Secondary | ICD-10-CM | POA: Diagnosis not present

## 2022-03-15 DIAGNOSIS — M9904 Segmental and somatic dysfunction of sacral region: Secondary | ICD-10-CM | POA: Diagnosis not present

## 2022-03-15 DIAGNOSIS — M9902 Segmental and somatic dysfunction of thoracic region: Secondary | ICD-10-CM | POA: Diagnosis not present

## 2022-03-15 DIAGNOSIS — M9901 Segmental and somatic dysfunction of cervical region: Secondary | ICD-10-CM | POA: Diagnosis not present

## 2022-03-15 DIAGNOSIS — M9903 Segmental and somatic dysfunction of lumbar region: Secondary | ICD-10-CM | POA: Diagnosis not present

## 2022-03-16 ENCOUNTER — Ambulatory Visit: Payer: Medicare PPO | Admitting: Sports Medicine

## 2022-03-16 VITALS — BP 110/80 | HR 82 | Ht 63.0 in | Wt 174.0 lb

## 2022-03-16 DIAGNOSIS — M542 Cervicalgia: Secondary | ICD-10-CM | POA: Diagnosis not present

## 2022-03-16 DIAGNOSIS — M5136 Other intervertebral disc degeneration, lumbar region: Secondary | ICD-10-CM | POA: Diagnosis not present

## 2022-03-16 DIAGNOSIS — G8929 Other chronic pain: Secondary | ICD-10-CM

## 2022-03-16 DIAGNOSIS — M545 Low back pain, unspecified: Secondary | ICD-10-CM | POA: Diagnosis not present

## 2022-03-16 DIAGNOSIS — M5030 Other cervical disc degeneration, unspecified cervical region: Secondary | ICD-10-CM

## 2022-03-16 MED ORDER — MELOXICAM 15 MG PO TABS: 15.0000 mg | ORAL_TABLET | Freq: Every day | ORAL | 0 refills | Status: DC

## 2022-03-16 NOTE — Patient Instructions (Addendum)
Good to see you  ?- Recommend relative rest, no physical activity for >15 minutes, taking rest breaks as needed for the next 3 weeks or until reevaluated ?- Start meloxicam 15 mg daily x2 weeks.  If still having pain after 2 weeks, complete 3rd-week of meloxicam. May use remaining meloxicam as needed once daily for pain control.  Do not to use additional NSAIDs while taking meloxicam.  May use Tylenol [phone] mg 2 to 3 times a day for breakthrough pain. ?- Start home exercise program for neck, mid back, low back ?3 week follow up  ?

## 2022-03-16 NOTE — Progress Notes (Signed)
? ? [name] D.[name] ?[facility] ?[name] ?Phone: [phone] ?  ?Assessment and Plan:   ?  ?1. Neck pain ?2. Degenerative disc disease, lumbar ?3. Degenerative disc disease, cervical ?4. Chronic low back pain, unspecified back pain laterality, unspecified whether sciatica present ?-Chronic with exacerbation, subsequent visit ?- Acute flare of multiple musculoskeletal complaints likely due to playing golf for patient had to walk 9 holes and carry her bag ?- Recommend relative rest, no physical activity for >15 minutes, taking rest breaks as needed for the next 3 weeks or until reevaluated ?- Start meloxicam 15 mg daily x2 weeks.  If still having pain after 2 weeks, complete 3rd-week of meloxicam. May use remaining meloxicam as needed once daily for pain control.  Do not to use additional NSAIDs while taking meloxicam.  May use Tylenol [phone] mg 2 to 3 times a day for breakthrough pain. ?- Start home exercise program for neck, mid back, low back ?-Recommend no golf for the next 3 weeks.  When returning to golf, would recommend using a cart ? ?Pertinent previous records reviewed include C-spine x-ray 10/05/2021 ?  ?Follow Up: 3 weeks for reevaluation.  Would recommend discontinuing meloxicam at that time and could discuss PT versus long-term pain management plan.  Of note, patient had tried duloxetine in the past, but felt that it worsened tremors ?  ?Subjective:   ?I, [name], am serving as a Education administrator for [employer] ? ?Chief Complaint: neck pain  ? ?HPI:  ?10/05/2021 ?Dana Dyer is a 71 y.o. female coming in with complaint of low back pain.  Last time I saw patient was greater than a year ago.  Patient has been on Cymbalta.  Been doing home exercises.  Patient states LBP did well for about 6-9 months. Fitness walking started the pain. Can't do the hills. Neck pain when turning head left and right. Flexion and extension also  hurts. Sharp catching type pains, while back is more throbbing. Ibuprofen takes the edge off. Only takes it when she has to because of previous medical problems. ? ?03/16/22 ?Patient states neck mid back and lower back , started about 6 weeks ago, went to [employer] and that didn't help, her back feels like its in a body cast , isnt able to play golf and wants to get back to it walking and swinging really puts her in pain, hasnt been able to do exercises, does get some numbness and tingling that will go down to her mid but cheek does get tingling in her fingers has to keep her arms straight at night, has non alcoholic sorosis would like an injection, has ben trying to take as little advil as she can , has tried topical creams that dont help has spinal stenosis got a little better but now its sore all over the "frame "and thoracic pain seems to be carrying the tension of it and it travels to the neck has so much tension that needs to be released ice doesn't phase it but hasnt used heat  ? ?Relevant Historical Information: C-spine DDD, lumbar spine DDD, hypertension, DM type II, NAFLD ? ?Additional pertinent review of systems negative. ? ? ?Current Outpatient Medications:  ?  amLODipine (NORVASC) 5 MG tablet, Take 1 tablet (5 mg total) by mouth daily., Disp: 90 tablet, Rfl: 0 ?  BD PEN NEEDLE NANO 2ND GEN 32G X 4 MM MISC, USE AS DIRECTED, Disp: 400 each, Rfl: 3 ?  Continuous Blood Gluc Sensor (DEXCOM G6 SENSOR) MISC, 1 Device by Does not apply route as directed., Disp: 3 each, Rfl: 11 ?  Continuous Blood Gluc Transmit (DEXCOM G6 TRANSMITTER) MISC, USE AS DIRECTED, Disp: 1 each, Rfl: 3 ?  escitalopram (LEXAPRO) 10 MG tablet, Take 1 tablet (10 mg total) by mouth daily., Disp: 30 tablet, Rfl: 5 ?  famotidine (PEPCID) 20 MG tablet, Take 20 mg by mouth at bedtime., Disp: , Rfl:  ?  furosemide (LASIX) 20 MG tablet, Take 1 tablet (20 mg total) by mouth every other day., Disp: 30 tablet, Rfl:  ?  insulin glargine (LANTUS SOLOSTAR)  100 UNIT/ML Solostar Pen, Inject 30 Units into the skin daily., Disp: 45 mL, Rfl: 3 ?  insulin lispro (HUMALOG KWIKPEN) 200 UNIT/ML KwikPen, Max Daily 120, Disp: 30 mL, Rfl: 3 ?  KLOR-CON M20 20 MEQ tablet, TAKE 1 TABLET BY MOUTH EVERY DAY, Disp: 90 tablet, Rfl: 1 ?  lactulose (CHRONULAC) 10 GM/15ML solution, Take 3 tablespoons daily, Disp: , Rfl: 3 ?  losartan (COZAAR) 100 MG tablet, TAKE 1 TABLET BY MOUTH EVERY DAY, Disp: 90 tablet, Rfl: 1 ?  metFORMIN (GLUCOPHAGE) 1000 MG tablet, TAKE 1 TABLET BY MOUTH TWICE A DAY, Disp: 180 tablet, Rfl: 3 ?  spironolactone (ALDACTONE) 50 MG tablet, Take 1 tablet (50 mg total) by mouth every other day., Disp: 30 tablet, Rfl: 11 ?  mupirocin ointment (BACTROBAN) 2 %, Apply 1 application topically 2 (two) times daily., Disp: 22 g, Rfl: 1 ?  NONFORMULARY OR COMPOUNDED ITEM, Antifungal-terbinafine-3%, fluconazole -2%, tea tree oil-5%,urea- 10%,in DMSO suspension # 36m, apply to the affected nail(s) once (at bedtime) or twice daily., Disp: 1 each, Rfl: 0 ?  ondansetron (ZOFRAN) 4 MG tablet, Take 1 tablet (4 mg total) by mouth 2 (two) times daily as needed for nausea., Disp: 50 tablet, Rfl: 3 ? ?Current Facility-Administered Medications:  ?  TDaP (BOOSTRIX) injection 0.5 mL, 0.5 mL, Intramuscular, Once, Burchette, BAlinda Sierras MD  ? ?Objective:   ?  ?Vitals:  ? 03/16/22 1327  ?BP: 110/80  ?Pulse: 82  ?SpO2: 99%  ?Weight: 174 lb (78.9 kg)  ?Height: 5' 3"  (1.6 m)  ?  ?  ?Body mass index is 30.82 kg/m?.  ?  ?Physical Exam:   ? ?Cervical Spine: Posture normal ?Skin: normal, intact ? ?Neurological:  ? ?Strength: ? Right  Left   ?Deltoid 5/5 5/5  ?Bicep 5/5  5/5  ?Tricep 5/5 5/5  ?Wrist Flexion 5/5 5/5  ?Wrist Extension 5/5 5/5  ?Grip 5/5 5/5  ?Finger Abduction 5/5 5/5  ? ?Sensation: intact to light touch in upper extremities bilaterally ? ?Spurling's:  negative bilaterally ?Neck ROM: Full active ROM ?TTP: Cervical paraspinal, thoracic paraspinal, lumbar paraspinal ?NTTP:  cervical spinous  processes,  trapezius  ? ?Gen: Appears well, nad, nontoxic and pleasant ?Psych: Alert and oriented, appropriate mood and affect ?Neuro: sensation intact, strength is 5/5 in upper and lower extremities, muscle tone wnl ?Skin: no susupicious lesions or rashes ? ?Back - Normal skin, Spine with normal alignment and no deformity.   ?No tenderness to vertebral process palpation.   ?  ?Straight leg raise negative ?  ? ?Electronically signed by:  ?BBenito MccreedyD.OMerril Abbe?[facility] ?1:58 PM 03/16/22 ?

## 2022-03-17 ENCOUNTER — Ambulatory Visit: Payer: Medicare PPO | Admitting: Internal Medicine

## 2022-03-17 ENCOUNTER — Encounter: Payer: Self-pay | Admitting: Internal Medicine

## 2022-03-17 VITALS — BP 124/70 | HR 88 | Ht 63.0 in | Wt 171.2 lb

## 2022-03-17 DIAGNOSIS — E785 Hyperlipidemia, unspecified: Secondary | ICD-10-CM | POA: Diagnosis not present

## 2022-03-17 DIAGNOSIS — E1142 Type 2 diabetes mellitus with diabetic polyneuropathy: Secondary | ICD-10-CM | POA: Diagnosis not present

## 2022-03-17 DIAGNOSIS — Z794 Long term (current) use of insulin: Secondary | ICD-10-CM | POA: Diagnosis not present

## 2022-03-17 DIAGNOSIS — E875 Hyperkalemia: Secondary | ICD-10-CM | POA: Diagnosis not present

## 2022-03-17 DIAGNOSIS — R739 Hyperglycemia, unspecified: Secondary | ICD-10-CM | POA: Diagnosis not present

## 2022-03-17 DIAGNOSIS — E871 Hypo-osmolality and hyponatremia: Secondary | ICD-10-CM

## 2022-03-17 LAB — POCT GLYCOSYLATED HEMOGLOBIN (HGB A1C): Hemoglobin A1C: 6.7 % — AB (ref 4.0–5.6)

## 2022-03-17 NOTE — Patient Instructions (Addendum)
-   Keep Up the Good Work ! ?- Continue Lantus 35 units daily  ?- Continue  Humalog 12 units with Breakfast,  20 units Lunch and 20 units supper ?- Continue Metformin 1000 mg, 1 tablet twice daily  ? ?- Humalog correctional insulin: ADD extra units on insulin to your meal-time Humalog  dose if your blood sugars are higher than 155. Use the scale below to help guide you:  ? ?Blood sugar before meal Number of units to inject  ?Less than 155 0 unit  ?156 - 180 1 units  ?181 - 205 2 units  ?206 - 230 3 units  ?231 - 255 4 units  ?256 - 280 5 units  ?281 - 305 6 units  ?306 - 330 7 units  ?331 - 355 8 units  ?356 - 380 9 units   ?381 - 405 10 units  ? ? ? ? ?HOW TO TREAT LOW BLOOD SUGARS (Blood sugar LESS THAN 70 MG/DL) ?Please follow the RULE OF 15 for the treatment of hypoglycemia treatment (when your (blood sugars are less than 70 mg/dL)  ? ?STEP 1: Take 15 grams of carbohydrates when your blood sugar is low, which includes:  ?3-4 GLUCOSE TABS  OR ?3-4 OZ OF JUICE OR REGULAR SODA OR ?ONE TUBE OF GLUCOSE GEL   ? ?STEP 2: RECHECK blood sugar in 15 MINUTES ?STEP 3: If your blood sugar is still low at the 15 minute recheck --> then, go back to STEP 1 and treat AGAIN with another 15 grams of carbohydrates ?

## 2022-03-17 NOTE — Progress Notes (Signed)
? ?Name: Dana Dyer  ?Age/ Sex: 71 y.o., female   ?MRN/ DOB: [id number], 08/29/1951    ? ?PCP: Eulas Post, MD   ?Reason for Endocrinology Evaluation: Type 2 Diabetes Mellitus  ?Initial Endocrine Consultative Visit: 10/23/18  ? ? ?PATIENT IDENTIFIER: Dana Dyer is a 71 y.o. female with a past medical history of HTN, T2DM, Cirrhosis (Dx 2019), Fibromyalgia and dyslipidemia . The patient has followed with Endocrinology clinic since 10/23/18 for consultative assistance with management of her diabetes. ? ?DIABETIC HISTORY:  ?Dana Dyer was diagnosed with T2DM in 2005. She used to be on SU but does not recall intolerance. She is intolerant to victoza - nauea and invokana- yeast infection. She was started on insulin therapy in 2009. Her hemoglobin A1c has ranged from 7.8 % in 2012, peaking at 10.9% in 2018. ? ? ?Ozempic was stopped in 06/2019 due to persistent nausea. ? ? ?Switched pravastatin to crestor 09/2019 ? ?SUBJECTIVE:  ? ?During the last visit (12/15/2021): A1c was 7.3%. We adjusted MDI regimen and continue metformin ? ? ?Today (03/17/2022): Dana Dyer is here for a follow up on diabetes management.  She checks her blood sugars multiple times a day through CGM.  The patient has not had hypoglycemic episodes since the last clinic visit per ? ? ?Continues to follow up with Gi for cirrhosis  ?She is on Lactulose started 12/2021 ?She is on limited liquid intake due to hyponatremia  ?She has been drinking diet coke  ? ?Pt wants to discuss recent BMP  ?Has spinal stenosis and is having back issues , seeing chiropractor , also saw Sports Medicine specialist ,started on Meloxicam ? ? ?She is on Kcl 20 mEq  ?She forgets to take her 2nd dose of metformin  ? ? ?HOME DIABETES REGIMEN:  ?Lantus 35 units daily ?Humalog 12 units with Breakfast, 20 units with lunch and 20 units with supper ?Metformin 1000 mg BID  ?Crestor 10 mg daily - stopped it  ? ? ? ?CONTINUOUS GLUCOSE MONITORING RECORD  INTERPRETATION   ? ?Dates of Recording: 3/31-4/13/2023 ? ?Sensor description:dexcom ? ?Results statistics: ?  ?CGM use % of time 93  ?Average and SD 165/45  ?Time in range  66%  ?% Time Above 180 29  ?% Time above 250 4  ?% Time Below target <1  ? ? ? ? ?Glycemic patterns summary: Bg's optimal overnight, with hyperglycemia noted during the day  ? ?Hyperglycemic episodes  postprandial  ? ?Hypoglycemic episodes occurred  n/a ? ?Overnight periods: optimally  ? ? ? ?DIABETIC COMPLICATIONS: ?Microvascular complications:  ?Neuroathy ?Denies: retinopathy, nephropathy ?Last eye exam: Completed 09/06/2021 ?  ?  ?Macrovascular complications: ?  ?Denies: CAD, PVD, CVA ?  ? ?HISTORY:  ?Past Medical History:  ?Past Medical History:  ?Diagnosis Date  ? ALLERGIC RHINITIS 10/09/2007  ? Allergy   ? Arthritis   ? hips, spine  ? DIABETES MELLITUS, UNCONTROLLED 03/16/2009  ? FATIGUE 10/21/2008  ? FIBROMYALGIA 10/09/2007  ? GERD (gastroesophageal reflux disease)   ? diet  controlled, no meds  ? H/O vaginal hysterectomy   ? HYPERLIPIDEMIA 10/09/2007  ? diet controlled, no meds  ? HYPERTENSION 10/09/2007  ? Morton's neuroma   ? OBSTRUCTIVE SLEEP APNEA 10/09/2007  ? Plantar fasciitis   ? History - right  ? Sleep apnea   ? Uses CPAP  ? TRANSAMINASES, SERUM, ELEVATED 07/08/2009  ? ?Past Surgical History:  ?Past Surgical History:  ?Procedure Laterality Date  ? ABDOMINAL HYSTERECTOMY  1979  ?  prolapsed uterus  ? BASAL CELL CARCINOMA EXCISION    ? BREAST BIOPSY Left 2012  ? left 2012 stereo  ? BREAST REDUCTION SURGERY Bilateral 1992  ? COLONOSCOPY  2006  ? Timmothy Euler  ? mamoplasty    ? reduction  ? REDUCTION MAMMAPLASTY Bilateral 1992  ? TONSILLECTOMY  1959  ? TUBAL LIGATION  1976  ? ?Social History:  reports that she has never smoked. She has never used smokeless tobacco. She reports that she does not drink alcohol and does not use drugs. ?Family History:  ?Family History  ?Problem Relation Age of Onset  ? Diabetes Mother   ? Arthritis Mother   ? Cancer  Mother   ?     breast  ? Breast cancer Mother 71  ? Alcohol abuse Father   ? Arthritis Father   ? Hypertension Maternal Grandfather   ? Miscarriages / Stillbirths Paternal Grandfather   ? Breast cancer Maternal Aunt   ?     diagnosed in her 62's  ? Colon polyps Neg Hx   ? Colon cancer Neg Hx   ? Esophageal cancer Neg Hx   ? Rectal cancer Neg Hx   ? Stomach cancer Neg Hx   ? ? ? ?HOME MEDICATIONS: ?Allergies as of 03/17/2022   ? ?   Reactions  ? Amoxicillin-pot Clavulanate   ? Severe diarrhea  ? Codeine Sulfate   ? REACTION: "hyper"  ? Diflucan [fluconazole] Hives  ? Erythromycin Base   ? Invokana [canagliflozin] Other (See Comments)  ? Recurrent yeast infections  ? Irbesartan-hydrochlorothiazide   ? Latex   ? Victoza [liraglutide] Nausea Only  ? Ozempic (0.25 Or 0.5 Mg-dose) [semaglutide(0.25 Or 0.37m-dos)] Nausea Only  ? ?  ? ?  ?Medication List  ?  ? ?  ? Accurate as of March 17, 2022 10:29 AM. If you have any questions, ask your nurse or doctor.  ?  ?  ? ?  ? ?amLODipine 5 MG tablet ?Commonly known as: NORVASC ?Take 1 tablet (5 mg total) by mouth daily. ?  ?BD Pen Needle Nano 2nd Gen 32G X 4 MM Misc ?Generic drug: Insulin Pen Needle ?USE AS DIRECTED ?  ?Dexcom G6 Sensor Misc ?1 Device by Does not apply route as directed. ?  ?Dexcom G6 Transmitter Misc ?USE AS DIRECTED ?  ?escitalopram 10 MG tablet ?Commonly known as: Lexapro ?Take 1 tablet (10 mg total) by mouth daily. ?  ?famotidine 20 MG tablet ?Commonly known as: PEPCID ?Take 20 mg by mouth at bedtime. ?  ?furosemide 20 MG tablet ?Commonly known as: Lasix ?Take 1 tablet (20 mg total) by mouth every other day. ?  ?HumaLOG KwikPen 200 UNIT/ML KwikPen ?Generic drug: insulin lispro ?Max Daily 120 ?  ?Klor-Con M20 20 MEQ tablet ?Generic drug: potassium chloride SA ?TAKE 1 TABLET BY MOUTH EVERY DAY ?  ?lactulose 10 GM/15ML solution ?Commonly known as: [place]?Take 3 tablespoons daily ?  ?Lantus SoloStar 100 UNIT/ML Solostar Pen ?Generic drug: insulin  glargine ?Inject 30 Units into the skin daily. ?  ?losartan 100 MG tablet ?Commonly known as: COZAAR ?TAKE 1 TABLET BY MOUTH EVERY DAY ?  ?meloxicam 15 MG tablet ?Commonly known as: MOBIC ?Take 1 tablet (15 mg total) by mouth daily. ?  ?metFORMIN 1000 MG tablet ?Commonly known as: GLUCOPHAGE ?TAKE 1 TABLET BY MOUTH TWICE A DAY ?  ?mupirocin ointment 2 % ?Commonly known as: BACTROBAN ?Apply 1 application topically 2 (two) times daily. ?  ?NONFORMULARY OR COMPOUNDED ITEM ?Antifungal-terbinafine-3%, fluconazole -  2%, tea tree oil-5%,urea- 10%,in DMSO suspension # 81m, apply to the affected nail(s) once (at bedtime) or twice daily. ?  ?ondansetron 4 MG tablet ?Commonly known as: Zofran ?Take 1 tablet (4 mg total) by mouth 2 (two) times daily as needed for nausea. ?  ?spironolactone 50 MG tablet ?Commonly known as: Aldactone ?Take 1 tablet (50 mg total) by mouth every other day. ?  ? ?  ? ? ? ?OBJECTIVE:  ? ?Vital Signs: BP 124/70 (BP Location: Left Arm, Patient Position: Sitting, Cuff Size: Small)   Pulse 88   Ht 5' 3"  (1.6 m)   Wt 171 lb 3.2 oz (77.7 kg)   SpO2 99%   BMI 30.33 kg/m?   ?Wt Readings from Last 3 Encounters:  ?03/17/22 171 lb 3.2 oz (77.7 kg)  ?03/16/22 174 lb (78.9 kg)  ?02/15/22 174 lb 8 oz (79.2 kg)  ? ? ? ?Exam: ?General: Pt appears well and is in NAD  ?Lungs: Clear with good BS bilat with no rales, rhonchi, or wheezes  ?Heart: RRR   ?Abdomen: Normoactive bowel sounds, soft, nontender, without masses or organomegaly palpable  ?Extremities: No  pretibial edema.  ?Neuro: MS is good with appropriate affect, pt is alert and Ox3  ? ?DM Foot 12/15/2021 ?The skin of the feet is intact without sores or ulcerations. ?The pedal pulses are 2+ on right and 2+ on left. ?The sensation is intact   to a screening 5.07, 10 gram monofilament on the left  ? ? ? ? ?DATA REVIEWED: ? ?Lab Results  ?Component Value Date  ? HGBA1C 6.7 (A) 03/17/2022  ? HGBA1C 7.3 (A) 12/15/2021  ? HGBA1C 7.8 (A) 09/30/2021  ? ? Latest  Reference Range & Units 03/08/22 12:59  ?Sodium 135 - 145 mEq/L 127 (L)  ?Potassium 3.5 - 5.1 mEq/L 5.8 (H)  ?Chloride 96 - 112 mEq/L 97  ?CO2 19 - 32 mEq/L 21  ?Glucose 70 - 99 mg/dL 90  ?BUN 6 - 23 mg/dL 29 (H)  ?Creatinine 0.40 - 1.20 m

## 2022-03-18 ENCOUNTER — Encounter: Payer: Self-pay | Admitting: Internal Medicine

## 2022-03-23 ENCOUNTER — Other Ambulatory Visit: Payer: Self-pay | Admitting: Family Medicine

## 2022-03-25 ENCOUNTER — Telehealth: Payer: Self-pay

## 2022-03-25 NOTE — Telephone Encounter (Signed)
Patient called stating that she had been taking Meloxicam for her back per Dr. Glennon Mac and she was feeling about 35% better, then her dog had gotten out and while chasing the dog she twisted her knee and the Meloxicam is not even touching the pain. Patient was calling to ask what is the safest amount of tylenol she could take with her medical conditions. Per Dr./ Tamala Julian patient could take no more than 560m twice a day for three days, continue the Meloxicam, and ice 20 minutes every for hours. Patient was informed if she develops any abdominal pain to seek medical attention immediately. Patient stated understanding and was grateful for the offices help.   ?

## 2022-03-28 ENCOUNTER — Telehealth: Payer: Self-pay

## 2022-03-28 ENCOUNTER — Other Ambulatory Visit (INDEPENDENT_AMBULATORY_CARE_PROVIDER_SITE_OTHER): Payer: Medicare PPO

## 2022-03-28 DIAGNOSIS — E876 Hypokalemia: Secondary | ICD-10-CM

## 2022-03-28 LAB — BASIC METABOLIC PANEL
BUN: 34 mg/dL — ABNORMAL HIGH (ref 6–23)
CO2: 22 mEq/L (ref 19–32)
Calcium: 9.7 mg/dL (ref 8.4–10.5)
Chloride: 104 mEq/L (ref 96–112)
Creatinine, Ser: 1.07 mg/dL (ref 0.40–1.20)
GFR: 51.86 mL/min — ABNORMAL LOW (ref >60.00)
Glucose, Bld: 124 mg/dL — ABNORMAL HIGH (ref 70–99)
Potassium: 5 mEq/L (ref 3.5–5.1)
Sodium: 134 mEq/L — ABNORMAL LOW (ref 135–145)

## 2022-03-28 NOTE — Telephone Encounter (Signed)
Patient wants to know if she should be doing the sugar free beverages or is she okay to have the regular kind.  ?

## 2022-03-28 NOTE — Telephone Encounter (Signed)
Patient notified

## 2022-03-29 ENCOUNTER — Encounter: Payer: Self-pay | Admitting: Gastroenterology

## 2022-03-30 ENCOUNTER — Encounter: Payer: Self-pay | Admitting: Gastroenterology

## 2022-03-30 ENCOUNTER — Ambulatory Visit (AMBULATORY_SURGERY_CENTER): Payer: Medicare PPO | Admitting: Gastroenterology

## 2022-03-30 VITALS — BP 100/54 | HR 94 | Temp 98.6°F | Resp 15 | Ht 63.0 in | Wt 174.0 lb

## 2022-03-30 DIAGNOSIS — K529 Noninfective gastroenteritis and colitis, unspecified: Secondary | ICD-10-CM | POA: Diagnosis not present

## 2022-03-30 DIAGNOSIS — D122 Benign neoplasm of ascending colon: Secondary | ICD-10-CM | POA: Diagnosis not present

## 2022-03-30 DIAGNOSIS — K2970 Gastritis, unspecified, without bleeding: Secondary | ICD-10-CM | POA: Diagnosis not present

## 2022-03-30 DIAGNOSIS — Z8719 Personal history of other diseases of the digestive system: Secondary | ICD-10-CM

## 2022-03-30 DIAGNOSIS — G4730 Sleep apnea, unspecified: Secondary | ICD-10-CM | POA: Diagnosis not present

## 2022-03-30 DIAGNOSIS — Z86010 Personal history of colonic polyps: Secondary | ICD-10-CM | POA: Diagnosis not present

## 2022-03-30 DIAGNOSIS — I10 Essential (primary) hypertension: Secondary | ICD-10-CM | POA: Diagnosis not present

## 2022-03-30 DIAGNOSIS — D124 Benign neoplasm of descending colon: Secondary | ICD-10-CM | POA: Diagnosis not present

## 2022-03-30 DIAGNOSIS — M797 Fibromyalgia: Secondary | ICD-10-CM | POA: Diagnosis not present

## 2022-03-30 DIAGNOSIS — I8500 Esophageal varices without bleeding: Secondary | ICD-10-CM

## 2022-03-30 DIAGNOSIS — K5289 Other specified noninfective gastroenteritis and colitis: Secondary | ICD-10-CM

## 2022-03-30 DIAGNOSIS — K3189 Other diseases of stomach and duodenum: Secondary | ICD-10-CM | POA: Diagnosis not present

## 2022-03-30 DIAGNOSIS — K7460 Unspecified cirrhosis of liver: Secondary | ICD-10-CM

## 2022-03-30 MED ORDER — SODIUM CHLORIDE 0.9 % IV SOLN: 500.0000 mL | Freq: Once | INTRAVENOUS | Status: DC

## 2022-03-30 NOTE — Op Note (Addendum)
[facility] ?Patient Name: Dana Dyer ?Procedure Date: 03/30/2022 12:46 PM ?MRN: [id number] ?Endoscopist: Milus Banister , MD ?Age: 71 ?Referring MD:  ?Date of Birth: 02/03/1951 ?Gender: Female ?Account #: [id number] ?Procedure:                Colonoscopy ?Indications:              High risk colon cancer surveillance: Personal  ?                          history of colonic polyps; Colonoscopy 2006 Dr.  ?                          Timmothy Euler found no polyps. ?Colonoscopy Dr. Ardis Hughs  ?                          December 2016 found 7 subcentimeter polyps. ?These  ?                          were all adenomatous polyps on pathology. ?Also  ?                          documented left-sided diverticulosis. ?I recommend  ?                          she have a repeat colonoscopy at 3-year  ?                          interval.???Colonoscopy January 2020 four?polyps  ?                          were removed from her colon,?all were adenomatous  ?                          on pathology. Sigmoid polyp site was labeled with  ?                          spot. ?Medicines:                Monitored Anesthesia Care ?Procedure:                Pre-Anesthesia Assessment: ?                          - Prior to the procedure, a History and Physical  ?                          was performed, and patient medications and  ?                          allergies were reviewed. The patient's tolerance of  ?                          previous anesthesia was also reviewed. The risks  ?  and benefits of the procedure and the sedation  ?                          options and risks were discussed with the patient.  ?                          All questions were answered, and informed consent  ?                          was obtained. Prior Anticoagulants: The patient has  ?                          taken no previous anticoagulant or antiplatelet  ?                          agents. ASA Grade Assessment: III - A patient with  ?                           severe systemic disease. After reviewing the risks  ?                          and benefits, the patient was deemed in  ?                          satisfactory condition to undergo the procedure. ?                          After obtaining informed consent, the colonoscope  ?                          was passed under direct vision. Throughout the  ?                          procedure, the patient's blood pressure, pulse, and  ?                          oxygen saturations were monitored continuously. The  ?                          CF [id number] #[id number] was introduced through the anus  ?                          and advanced to the the cecum, identified by  ?                          appendiceal orifice and ileocecal valve. The  ?                          colonoscopy was performed without difficulty. The  ?                          patient tolerated the procedure well. The quality  ?  of the bowel preparation was good. ?Scope In: 1:30:54 PM ?Scope Out: 1:43:59 PM ?Scope Withdrawal Time: 0 hours 10 minutes 1 second  ?Total Procedure Duration: 0 hours 13 minutes 5 seconds  ?Findings:                 Two sessile polyps were found in the descending  ?                          colon and ascending colon. The polyps were 2 to 3  ?                          mm in size. These polyps were removed with a cold  ?                          snare. Resection and retrieval were complete. ?                          1cm ulcer in the cecum with irregular adjacent  ?                          mucosa. This was not overtly neoplastic appearing.  ?                          Biopsies were taken with a cold forceps for  ?                          histology. ?                          Previous submucosal tattoo evident in sigmoid  ?                          region, normal surrounding mucosa. ?                          Medium sized internal hemorrhoids. ?                          The exam was otherwise  without abnormality on  ?                          direct and retroflexion views. ?Complications:            No immediate complications. Estimated blood loss:  ?                          None. ?Estimated Blood Loss:     Estimated blood loss: none. ?Impression:               - Two 2 to 3 mm polyps in the descending colon and  ?                          in the ascending colon, removed with a cold snare.  ?  Resected and retrieved. ?                          - 1cm ulcer in the cecum with irregular adjacent  ?                          mucosa. This was not overtly neoplastic appearing.  ?                          Biopsies were taken with a cold forceps for  ?                          histology. (this is possibly from meloxicam for  ?                          recent back pains) ?                          - Previous submucosal tattoo evident in sigmoid  ?                          region, normal surrounding mucosa. ?                          - Medium sized internal hemorrhoids. ?                          - The examination was otherwise normal on direct  ?                          and retroflexion views. ?Recommendation:           - EGD now, varices surveillance. ?                          - Await pathology results. ?Milus Banister, MD ?03/30/2022 1:55:12 PM ?This report has been signed electronically. ?

## 2022-03-30 NOTE — Progress Notes (Signed)
VSS, transported to PACU °

## 2022-03-30 NOTE — Op Note (Signed)
[place] ?Patient Name: Dana Dyer ?Procedure Date: 03/30/2022 12:45 PM ?MRN: [id number] ?Endoscopist: Milus Banister , MD ?Age: 71 ?Referring MD:  ?Date of Birth: [birth year] ?Gender: Female ?Account #: [id number] ?Procedure:                Upper GI endoscopy ?Indications:              Cirrhosis, varices surveillance: EGD 12/2018 small  ?                          distal esophagus varices, mild portal gastropathy,  ?                          no gastric varices. ?She was started on  ?                          nadolol?(eventually at 71m daily) ?Medicines:                Monitored Anesthesia Care ?Procedure:                Pre-Anesthesia Assessment: ?                          - Prior to the procedure, a History and Physical  ?                          was performed, and patient medications and  ?                          allergies were reviewed. The patient's tolerance of  ?                          previous anesthesia was also reviewed. The risks  ?                          and benefits of the procedure and the sedation  ?                          options and risks were discussed with the patient.  ?                          All questions were answered, and informed consent  ?                          was obtained. Prior Anticoagulants: The patient has  ?                          taken no previous anticoagulant or antiplatelet  ?                          agents. ASA Grade Assessment: III - A patient with  ?                          severe systemic disease. After reviewing the risks  ?  and benefits, the patient was deemed in  ?                          satisfactory condition to undergo the procedure. ?                          After obtaining informed consent, the endoscope was  ?                          passed under direct vision. Throughout the  ?                          procedure, the patient's blood pressure, pulse, and  ?                          oxygen saturations were  monitored continuously. The  ?                          GIF [id number] #[id number] was introduced through the  ?                          mouth, and advanced to the second part of duodenum.  ?                          The upper GI endoscopy was accomplished without  ?                          difficulty. The patient tolerated the procedure  ?                          well. ?Scope In: ?Scope Out: ?Findings:                 Small (< 5 mm) varices were found in the lower  ?                          third of the esophagus without signs of recent  ?                          bleeding. ?                          Changes of very mild portal gastropathy noted in  ?                          proximal stomach. ?                          Mild inflammation characterized by erythema,  ?                          friability and granularity was found in the gastric  ?                          antrum. Biopsies were taken with a cold forceps for  ?  histology. ?Complications:            No immediate complications. Estimated blood loss:  ?                          None. ?Estimated Blood Loss:     Estimated blood loss: none. ?Impression:               - Small (< 5 mm) esophageal varices. ?                          - Very mild portal gastropathy changes. ?                          - Mild, non-specific gastritis. Biopsied to check  ?                          for H. pylori. ?Recommendation:           - Patient has a contact number available for  ?                          emergencies. The signs and symptoms of potential  ?                          delayed complications were discussed with the  ?                          patient. Return to normal activities tomorrow.  ?                          Written discharge instructions were provided to the  ?                          patient. ?                          - Resume previous diet. ?                          - Continue present medications. ?                          - Await  pathology results. ?                          - See you at office visit in June (already  ?                          scheduled) ?Milus Banister, MD ?03/30/2022 2:00:40 PM ?This report has been signed electronically. ?

## 2022-03-30 NOTE — Progress Notes (Signed)
Pt's states no medical or surgical changes since previsit or office visit. 

## 2022-03-30 NOTE — Patient Instructions (Signed)
Handout on hemorrhoids and polyps given to patient. Await pathology results. ?Resume previous diet and continue present medications. ?Repeat colonoscopy for surveillance will be determined based off of pathology results. ?Follow up appointment in June - already scheduled. ? ? ?YOU HAD AN ENDOSCOPIC PROCEDURE TODAY AT [facility]:   Refer to the procedure report that was given to you for any specific questions about what was found during the examination.  If the procedure report does not answer your questions, please call your gastroenterologist to clarify.  If you requested that your care partner not be given the details of your procedure findings, then the procedure report has been included in a sealed envelope for you to review at your convenience later. ? ?YOU SHOULD EXPECT: Some feelings of bloating in the abdomen. Passage of more gas than usual.  Walking can help get rid of the air that was put into your GI tract during the procedure and reduce the bloating. If you had a lower endoscopy (such as a colonoscopy or flexible sigmoidoscopy) you may notice spotting of blood in your stool or on the toilet paper. If you underwent a bowel prep for your procedure, you may not have a normal bowel movement for a few days. ? ?Please Note:  You might notice some irritation and congestion in your nose or some drainage.  This is from the oxygen used during your procedure.  There is no need for concern and it should clear up in a day or so. ? ?SYMPTOMS TO REPORT IMMEDIATELY: ? ?Following lower endoscopy (colonoscopy or flexible sigmoidoscopy): ? Excessive amounts of blood in the stool ? Significant tenderness or worsening of abdominal pains ? Swelling of the abdomen that is new, acute ? Fever of 100?F or higher ? ?Following upper endoscopy (EGD) ? Vomiting of blood or coffee ground material ? New chest pain or pain under the shoulder blades ? Painful or persistently difficult swallowing ? New shortness of  breath ? Fever of 100?F or higher ? Black, tarry-looking stools ? ?For urgent or emergent issues, a gastroenterologist can be reached at any hour by calling [phone]. ?Do not use MyChart messaging for urgent concerns.  ? ? ?DIET:  We do recommend a small meal at first, but then you may proceed to your regular diet.  Drink plenty of fluids but you should avoid alcoholic beverages for 24 hours. ? ?ACTIVITY:  You should plan to take it easy for the rest of today and you should NOT DRIVE or use heavy machinery until tomorrow (because of the sedation medicines used during the test).   ? ?FOLLOW UP: ?Our staff will call the number listed on your records 48-72 hours following your procedure to check on you and address any questions or concerns that you may have regarding the information given to you following your procedure. If we do not reach you, we will leave a message.  We will attempt to reach you two times.  During this call, we will ask if you have developed any symptoms of COVID 19. If you develop any symptoms (ie: fever, flu-like symptoms, shortness of breath, cough etc.) before then, please call [phone].  If you test positive for Covid 19 in the 2 weeks post procedure, please call and report this information to [place].   ? ?If any biopsies were taken you will be contacted by phone or by letter within the next 1-3 weeks.  Please call us at [phone] if you have not heard about the  biopsies in 3 weeks.  ? ? ?SIGNATURES/CONFIDENTIALITY: ?You and/or your care partner have signed paperwork which will be entered into your electronic medical record.  These signatures attest to the fact that that the information above on your After Visit Summary has been reviewed and is understood.  Full responsibility of the confidentiality of this discharge information lies with you and/or your care-partner.  ?

## 2022-03-30 NOTE — Progress Notes (Signed)
Called to room to assist during endoscopic procedure.  Patient ID and intended procedure confirmed with present staff. Received instructions for my participation in the procedure from the performing physician.  

## 2022-03-30 NOTE — Progress Notes (Signed)
HPI: ?This is a woman with cirrhosis and a h/o polyps ? ? ?ROS: complete GI ROS as described in HPI, all other review negative. ? ?Constitutional:  No unintentional weight loss ? ? ?Past Medical History:  ?Diagnosis Date  ? ALLERGIC RHINITIS 10/09/2007  ? Allergy   ? Arthritis   ? hips, spine  ? DIABETES MELLITUS, UNCONTROLLED 03/16/2009  ? FATIGUE 10/21/2008  ? FIBROMYALGIA 10/09/2007  ? GERD (gastroesophageal reflux disease)   ? diet  controlled, no meds  ? H/O vaginal hysterectomy   ? HYPERLIPIDEMIA 10/09/2007  ? diet controlled, no meds  ? HYPERTENSION 10/09/2007  ? Morton's neuroma   ? OBSTRUCTIVE SLEEP APNEA 10/09/2007  ? Plantar fasciitis   ? History - right  ? Sleep apnea   ? Uses CPAP  ? TRANSAMINASES, SERUM, ELEVATED 07/08/2009  ? ? ?Past Surgical History:  ?Procedure Laterality Date  ? ABDOMINAL HYSTERECTOMY  1979  ? prolapsed uterus  ? BASAL CELL CARCINOMA EXCISION    ? BREAST BIOPSY Left 2012  ? left 2012 stereo  ? BREAST REDUCTION SURGERY Bilateral 1992  ? COLONOSCOPY  2006  ? Timmothy Euler  ? mamoplasty    ? reduction  ? REDUCTION MAMMAPLASTY Bilateral 1992  ? TONSILLECTOMY  1959  ? TUBAL LIGATION  1976  ? ? ?Current Outpatient Medications  ?Medication Sig Dispense Refill  ? amLODipine (NORVASC) 5 MG tablet Take 1 tablet (5 mg total) by mouth daily. 90 tablet 0  ? escitalopram (LEXAPRO) 10 MG tablet Take 1 tablet (10 mg total) by mouth daily. 30 tablet 5  ? famotidine (PEPCID) 20 MG tablet Take 20 mg by mouth at bedtime.    ? furosemide (LASIX) 20 MG tablet Take 1 tablet (20 mg total) by mouth every other day. 30 tablet   ? insulin glargine (LANTUS SOLOSTAR) 100 UNIT/ML Solostar Pen Inject 30 Units into the skin daily. 45 mL 3  ? insulin lispro (HUMALOG KWIKPEN) 200 UNIT/ML KwikPen Max Daily 120 30 mL 3  ? KLOR-CON M20 20 MEQ tablet TAKE 1 TABLET BY MOUTH EVERY DAY 90 tablet 1  ? lactulose (CHRONULAC) 10 GM/15ML solution Take 3 tablespoons daily  3  ? losartan (COZAAR) 100 MG tablet TAKE 1 TABLET BY MOUTH EVERY DAY  90 tablet 1  ? meloxicam (MOBIC) 15 MG tablet Take 1 tablet (15 mg total) by mouth daily. 30 tablet 0  ? metFORMIN (GLUCOPHAGE) 1000 MG tablet TAKE 1 TABLET BY MOUTH TWICE A DAY 180 tablet 3  ? spironolactone (ALDACTONE) 50 MG tablet Take 1 tablet (50 mg total) by mouth every other day. 30 tablet 11  ? BD PEN NEEDLE NANO 2ND GEN 32G X 4 MM MISC USE AS DIRECTED 400 each 3  ? Continuous Blood Gluc Sensor (DEXCOM G6 SENSOR) MISC 1 Device by Does not apply route as directed. 3 each 11  ? Continuous Blood Gluc Transmit (DEXCOM G6 TRANSMITTER) MISC USE AS DIRECTED 1 each 3  ? mupirocin ointment (BACTROBAN) 2 % Apply 1 application topically 2 (two) times daily. 22 g 1  ? NONFORMULARY OR COMPOUNDED ITEM Antifungal-terbinafine-3%, fluconazole -2%, tea tree oil-5%,urea- 10%,in DMSO suspension # 80m, apply to the affected nail(s) once (at bedtime) or twice daily. 1 each 0  ? ondansetron (ZOFRAN) 4 MG tablet Take 1 tablet (4 mg total) by mouth 2 (two) times daily as needed for nausea. 50 tablet 3  ? ?Current Facility-Administered Medications  ?Medication Dose Route Frequency Provider Last Rate Last Admin  ? 0.9 %  sodium  chloride infusion  500 mL Intravenous Once Milus Banister, MD      ? TDaP Durwin Reges) injection 0.5 mL  0.5 mL Intramuscular Once Eulas Post, MD      ? ? ?Allergies as of 03/30/2022 - Review Complete 03/30/2022  ?Allergen Reaction Noted  ? Amoxicillin-pot clavulanate  03/09/2012  ? Codeine sulfate  03/16/2009  ? Diflucan [fluconazole] Hives 03/16/2022  ? Erythromycin base  04/07/2009  ? Invokana [canagliflozin] Other (See Comments) 05/18/2015  ? Irbesartan-hydrochlorothiazide  04/07/2009  ? Latex    ? Victoza [liraglutide] Nausea Only 03/10/2016  ? Ozempic (0.25 or 0.5 mg-dose) [semaglutide(0.25 or 0.43m-dos)] Nausea Only 09/09/2019  ? ? ?Family History  ?Problem Relation Age of Onset  ? Diabetes Mother   ? Arthritis Mother   ? Cancer Mother   ?     breast  ? Breast cancer Mother 735 ? Alcohol abuse  Father   ? Arthritis Father   ? Hypertension Maternal Grandfather   ? Miscarriages / Stillbirths Paternal Grandfather   ? Breast cancer Maternal Aunt   ?     diagnosed in her 773's ? Colon polyps Neg Hx   ? Colon cancer Neg Hx   ? Esophageal cancer Neg Hx   ? Rectal cancer Neg Hx   ? Stomach cancer Neg Hx   ? ? ?Social History  ? ?Socioeconomic History  ? Marital status: Married  ?  Spouse name: Not on file  ? Number of children: Not on file  ? Years of education: Not on file  ? Highest education level: Not on file  ?Occupational History  ? Not on file  ?Tobacco Use  ? Smoking status: Never  ? Smokeless tobacco: Never  ?Vaping Use  ? Vaping Use: Never used  ?Substance and Sexual Activity  ? Alcohol use: No  ?  Alcohol/week: 0.0 standard drinks  ? Drug use: No  ? Sexual activity: Yes  ?  Birth control/protection: Other-see comments, Post-menopausal  ?  Comment: Hysterectomy  ?Other Topics Concern  ? Not on file  ?Social History Narrative  ? Not on file  ? ?Social Determinants of Health  ? ?Financial Resource Strain: Low Risk   ? Difficulty of Paying Living Expenses: Not hard at all  ?Food Insecurity: No Food Insecurity  ? Worried About RCharity fundraiserin the Last Year: Never true  ? Ran Out of Food in the Last Year: Never true  ?Transportation Needs: No Transportation Needs  ? Lack of Transportation (Medical): No  ? Lack of Transportation (Non-Medical): No  ?Physical Activity: Insufficiently Active  ? Days of Exercise per Week: 3 days  ? Minutes of Exercise per Session: 30 min  ?Stress: No Stress Concern Present  ? Feeling of Stress : Not at all  ?Social Connections: Unknown  ? Frequency of Communication with Friends and Family: Three times a week  ? Frequency of Social Gatherings with Friends and Family: Not on file  ? Attends Religious Services: Not on file  ? Active Member of Clubs or Organizations: No  ? Attends CArchivistMeetings: Never  ? Marital Status: Married  ?Intimate Partner Violence: Not  At Risk  ? Fear of Current or Ex-Partner: No  ? Emotionally Abused: No  ? Physically Abused: No  ? Sexually Abused: No  ? ? ? ?Physical Exam: ?BP (!) 112/53   Pulse 94   Temp 98.6 ?F (37 ?C)   Ht 5' 3"  (1.6 m)   Wt 174  lb (78.9 kg)   SpO2 99%   BMI 30.82 kg/m?  ?Constitutional: generally well-appearing ?Psychiatric: alert and oriented x3 ?Lungs: CTA bilaterally ?Heart: no MCR ? ?Assessment and plan: ?71 y.o. female with cirrhosis and a h/o polyps ? ?Surveillance colonoscoyp and surveillance egd today ? ?Care is appropriate for the ambulatory setting. ? ?Owens Loffler, MD ?[facility] ?03/30/2022, 1:06 PM ? ? ? ?

## 2022-03-31 ENCOUNTER — Other Ambulatory Visit: Payer: Self-pay | Admitting: Internal Medicine

## 2022-04-01 ENCOUNTER — Telehealth: Payer: Self-pay | Admitting: *Deleted

## 2022-04-01 NOTE — Telephone Encounter (Signed)
?  Follow up Call- ? ? ?  03/30/2022  ?  1:01 PM  ?Call back number  ?Post procedure Call Back phone  # [phone]  ?Permission to leave phone message Yes  ?  ? ?Patient questions: ? ?Do you have a fever, pain , or abdominal swelling? No. ?Pain Score  0 * ? ?Have you tolerated food without any problems? Yes.   ? ?Have you been able to return to your normal activities? Yes.   ? ?Do you have any questions about your discharge instructions: ?Diet   No. ?Medications  No. ?Follow up visit  No. ? ?Do you have questions or concerns about your Care? No. ? ?Actions: ?* If pain score is 4 or above: ?No action needed, pain <4. ? ? ?

## 2022-04-05 ENCOUNTER — Encounter: Payer: Self-pay | Admitting: Gastroenterology

## 2022-04-07 ENCOUNTER — Other Ambulatory Visit: Payer: Self-pay | Admitting: Gastroenterology

## 2022-04-07 ENCOUNTER — Ambulatory Visit: Payer: Medicare PPO | Admitting: Sports Medicine

## 2022-04-08 NOTE — Progress Notes (Signed)
? ? [name] D.[name] ?[facility] ?[place] ?Phone: [phone] ?  ?Assessment and Plan:   ?  ?1. Acute pain of left knee ?2. Primary osteoarthritis of left knee ?-Chronic with exacerbation, initial sports medicine visit ?- Likely an acute flare of osteoarthritis based on HPI, physical exam, MOI ?- Patient received temporary and incomplete relief when taking meloxicam for other musculoskeletal complaints, so elected to proceed with CSI at today's visit.  Tolerated well per note below ?- Discontinue regular meloxicam use ?- May use Tylenol as needed for day-to-day pain relief ?-X-ray obtained in clinic.  My interpretation: No acute fracture or dislocation.  Decreased joint space and medial compartment and cortical spurring of the patella representing mild osteoarthritis of the medial and patellofemoral compartments ?-Start HEP for knee ? ?Procedure: Knee Joint Injection ?Side: Left ?Indication: Acute pain with chronic osteoarthritis ? ?Risks explained and consent was given verbally. The site was cleaned with alcohol prep. A needle was introduced with an anterio-lateral approach. Injection given using 75m of 1% lidocaine without epinephrine and 190mof kenalog 4034ml. This was well tolerated and resulted in symptomatic relief.  Needle was removed, hemostasis achieved, and post injection instructions were explained.   Pt was advised to call or return to clinic if these symptoms worsen or fail to improve as anticipated.  ?Pertinent previous records reviewed include none ?  ?Follow Up: 3 weeks.  May discuss knee pain and patient's other neck and low back musculoskeletal complaints ?  ?Subjective:   ?I, MoePincus Badderm serving as a scrEducation administratorr Doctor [employer] ?Chief Complaint: neck pain  ?  ?HPI:  ?10/05/2021 ?JanAAIRAH NEGRETTE a 70 21o. female coming in with complaint of low back pain.  Last time I saw patient was greater than a year ago.   Patient has been on Cymbalta.  Been doing home exercises.  Patient states LBP did well for about 6-9 months. Fitness walking started the pain. Can't do the hills. Neck pain when turning head left and right. Flexion and extension also hurts. Sharp catching type pains, while back is more throbbing. Ibuprofen takes the edge off. Only takes it when she has to because of previous medical problems. ?  ?03/16/22 ?Patient states neck mid back and lower back , started about 6 weeks ago, [facility] [employer] [employer] that didn't help, her back feels like its in a body cast , isnt able to play golf and wants to get back to it walking and swinging really puts her in pain, hasnt been able to do exercises, does get some numbness and tingling that will go down to her mid but cheek does get tingling in her fingers has to keep her arms straight at night, has non alcoholic sorosis would like an injection, has ben trying to take as little advil as she can , has tried topical creams that dont help has spinal stenosis got a little better but now its sore all over the "frame "and thoracic pain seems to be carrying the tension of it and it travels to the neck has so much tension that needs to be released ice doesn't phase it but hasnt used heat  ? ?04/11/2022 ?Patient states that she hurt her L knee a month ago and her dog ran away from her she caught her dog and noticed that she did a twisting motion  had hurt her knee she has been icing and then taking meloxicam and had  some stomach discomfort she doesn't get any numbness or tingling just pain  ? ?  ?Relevant Historical Information: C-spine DDD, lumbar spine DDD, hypertension, DM type II, NAFLD ? ?Additional pertinent review of systems negative. ? ? ?Current Outpatient Medications:  ?  amLODipine (NORVASC) 5 MG tablet, Take 1 tablet (5 mg total) by mouth daily., Disp: 90 tablet, Rfl: 0 ?  BD PEN NEEDLE NANO 2ND GEN 32G X 4 MM MISC, USE AS DIRECTED, Disp: 400 each, Rfl: 3 ?  Continuous Blood Gluc  Sensor (DEXCOM G6 SENSOR) MISC, 1 Device by Does not apply route as directed., Disp: 3 each, Rfl: 11 ?  Continuous Blood Gluc Transmit (DEXCOM G6 TRANSMITTER) MISC, USE AS DIRECTED, Disp: 1 each, Rfl: 3 ?  escitalopram (LEXAPRO) 10 MG tablet, Take 1 tablet (10 mg total) by mouth daily., Disp: 30 tablet, Rfl: 5 ?  famotidine (PEPCID) 20 MG tablet, Take 20 mg by mouth at bedtime., Disp: , Rfl:  ?  furosemide (LASIX) 20 MG tablet, TAKE 1 TABLET BY MOUTH EVERY DAY, Disp: 90 tablet, Rfl: 3 ?  insulin glargine (LANTUS SOLOSTAR) 100 UNIT/ML Solostar Pen, Inject 30 Units into the skin daily., Disp: 45 mL, Rfl: 3 ?  insulin lispro (HUMALOG KWIKPEN) 200 UNIT/ML KwikPen, Max Daily 120, Disp: 30 mL, Rfl: 3 ?  KLOR-CON M20 20 MEQ tablet, TAKE 1 TABLET BY MOUTH EVERY DAY, Disp: 90 tablet, Rfl: 1 ?  lactulose (CHRONULAC) 10 GM/15ML solution, Take 3 tablespoons daily, Disp: , Rfl: 3 ?  losartan (COZAAR) 100 MG tablet, TAKE 1 TABLET BY MOUTH EVERY DAY, Disp: 90 tablet, Rfl: 1 ?  meloxicam (MOBIC) 15 MG tablet, Take 1 tablet (15 mg total) by mouth daily., Disp: 30 tablet, Rfl: 0 ?  metFORMIN (GLUCOPHAGE) 1000 MG tablet, TAKE 1 TABLET BY MOUTH TWICE A DAY, Disp: 180 tablet, Rfl: 3 ?  mupirocin ointment (BACTROBAN) 2 %, Apply 1 application topically 2 (two) times daily., Disp: 22 g, Rfl: 1 ?  NONFORMULARY OR COMPOUNDED ITEM, Antifungal-terbinafine-3%, fluconazole -2%, tea tree oil-5%,urea- 10%,in DMSO suspension # 45m, apply to the affected nail(s) once (at bedtime) or twice daily., Disp: 1 each, Rfl: 0 ?  ondansetron (ZOFRAN) 4 MG tablet, Take 1 tablet (4 mg total) by mouth 2 (two) times daily as needed for nausea., Disp: 50 tablet, Rfl: 3 ?  spironolactone (ALDACTONE) 50 MG tablet, Take 1 tablet (50 mg total) by mouth every other day., Disp: 30 tablet, Rfl: 11 ? ?Current Facility-Administered Medications:  ?  TDaP (BOOSTRIX) injection 0.5 mL, 0.5 mL, Intramuscular, Once, Burchette, BAlinda Sierras MD  ? ?Objective:   ?  ?Vitals:  ?  04/11/22 1320  ?BP: 110/70  ?Pulse: 93  ?SpO2: 98%  ?Weight: 170 lb (77.1 kg)  ?Height: 5' 3"  (1.6 m)  ?  ?  ?Body mass index is 30.11 kg/m?.  ?  ?Physical Exam:   ? ?General:  awake, alert oriented, no acute distress nontoxic ?Skin: no suspicious lesions or rashes ?Neuro:sensation intact, no deficits, strength 5/5 with no deficits, no atrophy, normal muscle tone ?Psych: No signs of anxiety, depression or other mood disorder ? ?Knee: ?Mild generalized swelling ?No deformity ?Neg fluid wave, joint milking ?ROM Flex 110 , Ext 0  ?TTP moderately medial joint line, medial femoral condyle ?TTP mildly lateral joint line, lateral femoral condyle ?NTTP over the quad tendon,  , patella, plica, patella tendon, tibial tuberostiy, fibular head, posterior fossa, pes anserine bursa, gerdy's tubercle,  ?Neg anterior and posterior drawer ?Neg lachman ?Neg  sag sign ?Negative varus stress ?Negative valgus stress for laxity, though caused mild medial discomfort ?Negative McMurray ?  ? ?Gait normal  ? ? ?Electronically signed by:  ?[name] D.[name] ?[facility] ?1:40 PM 04/11/22 ?

## 2022-04-11 ENCOUNTER — Ambulatory Visit (INDEPENDENT_AMBULATORY_CARE_PROVIDER_SITE_OTHER): Payer: Medicare PPO

## 2022-04-11 ENCOUNTER — Ambulatory Visit: Payer: Medicare PPO | Admitting: Sports Medicine

## 2022-04-11 VITALS — BP 110/70 | HR 93 | Ht 63.0 in | Wt 170.0 lb

## 2022-04-11 DIAGNOSIS — M25562 Pain in left knee: Secondary | ICD-10-CM

## 2022-04-11 DIAGNOSIS — M1712 Unilateral primary osteoarthritis, left knee: Secondary | ICD-10-CM

## 2022-04-11 NOTE — Patient Instructions (Addendum)
Good to see you ?Knee HEP  ?3 week follow up  ?

## 2022-04-12 ENCOUNTER — Other Ambulatory Visit: Payer: Self-pay | Admitting: Sports Medicine

## 2022-04-12 ENCOUNTER — Ambulatory Visit: Payer: Medicare PPO | Admitting: Pulmonary Disease

## 2022-04-14 DIAGNOSIS — E1165 Type 2 diabetes mellitus with hyperglycemia: Secondary | ICD-10-CM | POA: Diagnosis not present

## 2022-04-21 ENCOUNTER — Encounter: Payer: Self-pay | Admitting: Gastroenterology

## 2022-04-22 ENCOUNTER — Other Ambulatory Visit: Payer: Self-pay | Admitting: Gastroenterology

## 2022-04-23 ENCOUNTER — Other Ambulatory Visit: Payer: Self-pay | Admitting: Family Medicine

## 2022-04-23 DIAGNOSIS — F339 Major depressive disorder, recurrent, unspecified: Secondary | ICD-10-CM

## 2022-04-25 ENCOUNTER — Telehealth: Payer: Self-pay | Admitting: Gastroenterology

## 2022-04-25 ENCOUNTER — Other Ambulatory Visit: Payer: Self-pay

## 2022-04-25 DIAGNOSIS — K7460 Unspecified cirrhosis of liver: Secondary | ICD-10-CM

## 2022-04-25 MED ORDER — LACTULOSE 10 GM/15ML PO SOLN: ORAL | 1 refills | Status: DC

## 2022-04-25 NOTE — Telephone Encounter (Signed)
Inbound call from patient requesting refill of Lactulose be sent in to [facility]. AT [address].   Please advise.

## 2022-04-25 NOTE — Telephone Encounter (Signed)
Rx for lactulose sent to pharmacy as requested.

## 2022-04-26 ENCOUNTER — Other Ambulatory Visit: Payer: Self-pay | Admitting: Family Medicine

## 2022-04-26 ENCOUNTER — Other Ambulatory Visit: Payer: Self-pay | Admitting: Gastroenterology

## 2022-04-26 ENCOUNTER — Telehealth: Payer: Self-pay | Admitting: Family Medicine

## 2022-04-26 DIAGNOSIS — F339 Major depressive disorder, recurrent, unspecified: Secondary | ICD-10-CM

## 2022-04-26 NOTE — Telephone Encounter (Signed)
Pt advised that rx was already sent in on 04/25/22

## 2022-04-26 NOTE — Telephone Encounter (Signed)
Pt call and want a refill on escitalopram (LEXAPRO) 10 MG tablet sent to  [id number] [place], [place] - [address]  [phone] Fax:  [phone]

## 2022-04-28 NOTE — Progress Notes (Signed)
Dana Dyer D.[facility] Phone: [phone]   Assessment and Plan:     1. Chronic bilateral thoracic back pain -Chronic with exacerbation, initial sports medicine visit - Flare of chronic mid back pain, worse with activities around the house without red flag symptoms - Start HEP for mid back - Start Tylenol 500 to 1000 mg tablets 2-3 times a day for day-to-day pain relief  2. Acute pain of left knee 3. Primary osteoarthritis of left knee -Chronic with exacerbation, subsequent visit - Significant relief in acute flare of left knee pain after CSI at previous office visit on 04/11/2022 - Could repeat intra-articular CSI with goal of at least 3 months of relief between injections  4. Degenerative disc disease, lumbar 5. Degenerative disc disease, cervical -Chronic, stable, subsequent visit - Chronic low back and neck pain are overall stable without acute flare at this time   Pertinent previous records reviewed include none   Follow Up: 3 weeks for reevaluation of thoracic back pain.  Could obtain imaging and discuss PT versus advanced imaging at that time   Subjective:   I, Dana Dyer, am serving as a Education administrator for Doctor Glennon Mac   Chief Complaint: neck pain    HPI:  10/05/2021 Dana Dyer is a 71 y.o. female coming in with complaint of low back pain.  Last time I saw patient was greater than a year ago.  Patient has been on Cymbalta.  Been doing home exercises.  Patient states LBP did well for about 6-9 months. Fitness walking started the pain. Can't do the hills. Neck pain when turning head left and right. Flexion and extension also hurts. Sharp catching type pains, while back is more throbbing. Ibuprofen takes the edge off. Only takes it when she has to because of previous medical problems.   03/16/22 Patient states neck mid back and lower back , started about 6 weeks ago, went to [employer]  and that didn't help, her back feels like its in a body cast , isnt able to play golf and wants to get back to it walking and swinging really puts her in pain, hasnt been able to do exercises, does get some numbness and tingling that will go down to her mid but cheek does get tingling in her fingers has to keep her arms straight at night, has non alcoholic sorosis would like an injection, has ben trying to take as little advil as she can , has tried topical creams that dont help has spinal stenosis got a little better but now its sore all over the "frame "and thoracic pain seems to be carrying the tension of it and it travels to the neck has so much tension that needs to be released ice doesn't phase it but hasnt used heat    04/11/2022 Patient states that she hurt her L knee a month ago and her dog ran away from her she caught her dog and noticed that she did a twisting motion  had hurt her knee she has been icing and then taking meloxicam and had some stomach discomfort she doesn't get any numbness or tingling just pain   05/03/2022 Patient states that the lowe back has improved along with the neck . The mid back is still giving her fits , has pain when cooking and loading and unloading the dishwasher she can feel a ball of fire when she was laying down this morning  Relevant Historical Information: C-spine DDD, lumbar spine DDD, hypertension, DM type II, NAFLD  Additional pertinent review of systems negative.   Current Outpatient Medications:    amLODipine (NORVASC) 5 MG tablet, Take 1 tablet (5 mg total) by mouth daily., Disp: 90 tablet, Rfl: 0   BD PEN NEEDLE NANO 2ND GEN 32G X 4 MM MISC, USE AS DIRECTED, Disp: 400 each, Rfl: 3   Continuous Blood Gluc Sensor (DEXCOM G6 SENSOR) MISC, 1 Device by Does not apply route as directed., Disp: 3 each, Rfl: 11   Continuous Blood Gluc Transmit (DEXCOM G6 TRANSMITTER) MISC, USE AS DIRECTED, Disp: 1 each, Rfl: 3   escitalopram (LEXAPRO) 10 MG tablet,  TAKE 1 TABLET BY MOUTH EVERY DAY, Disp: 90 tablet, Rfl: 0   famotidine (PEPCID) 20 MG tablet, Take 20 mg by mouth at bedtime., Disp: , Rfl:    furosemide (LASIX) 20 MG tablet, TAKE 1 TABLET BY MOUTH EVERY DAY, Disp: 90 tablet, Rfl: 3   insulin glargine (LANTUS SOLOSTAR) 100 UNIT/ML Solostar Pen, Inject 30 Units into the skin daily., Disp: 45 mL, Rfl: 3   insulin lispro (HUMALOG KWIKPEN) 200 UNIT/ML KwikPen, Max Daily 120, Disp: 30 mL, Rfl: 3   KLOR-CON M20 20 MEQ tablet, TAKE 1 TABLET BY MOUTH EVERY DAY, Disp: 90 tablet, Rfl: 1   lactulose (CHRONULAC) 10 GM/15ML solution, TAKE 6 TABLESPOONS DAILY, Disp: 2430 mL, Rfl: 1   losartan (COZAAR) 100 MG tablet, TAKE 1 TABLET BY MOUTH EVERY DAY, Disp: 90 tablet, Rfl: 0   metFORMIN (GLUCOPHAGE) 1000 MG tablet, TAKE 1 TABLET BY MOUTH TWICE A DAY, Disp: 180 tablet, Rfl: 3   spironolactone (ALDACTONE) 50 MG tablet, Take 1 tablet (50 mg total) by mouth every other day., Disp: 30 tablet, Rfl: 11   meloxicam (MOBIC) 15 MG tablet, Take 1 tablet (15 mg total) by mouth daily., Disp: 30 tablet, Rfl: 0   mupirocin ointment (BACTROBAN) 2 %, Apply 1 application topically 2 (two) times daily., Disp: 22 g, Rfl: 1   NONFORMULARY OR COMPOUNDED ITEM, Antifungal-terbinafine-3%, fluconazole -2%, tea tree oil-5%,urea- 10%,in DMSO suspension # 69m, apply to the affected nail(s) once (at bedtime) or twice daily., Disp: 1 each, Rfl: 0   ondansetron (ZOFRAN) 4 MG tablet, Take 1 tablet (4 mg total) by mouth 2 (two) times daily as needed for nausea., Disp: 50 tablet, Rfl: 3  Current Facility-Administered Medications:    TDaP (BOOSTRIX) injection 0.5 mL, 0.5 mL, Intramuscular, Once, Burchette, BAlinda Sierras MD   Objective:     Vitals:   05/03/22 1300  BP: 132/78  Pulse: 97  SpO2: 99%  Weight: 173 lb (78.5 kg)  Height: 5' 3"  (1.6 m)      Body mass index is 30.65 kg/m.    Physical Exam:    Gen: Appears well, nad, nontoxic and pleasant Psych: Alert and oriented, appropriate  mood and affect Neuro: sensation intact, strength is 5/5 in upper and lower extremities, muscle tone wnl Skin: no susupicious lesions or rashes  Back - Normal skin, Spine with normal alignment and no deformity.   No tenderness to vertebral process palpation.   Thoracic paraspinous muscles are moderately tender and without spasm TTP bilateral trapezius, bilateral levator, bilateral rhomboid   Electronically signed by:  BBenito MccreedyD.OMarguerita MerlesSports Medicine 1:48 PM 05/03/22

## 2022-05-03 ENCOUNTER — Ambulatory Visit: Payer: Medicare PPO | Admitting: Sports Medicine

## 2022-05-03 VITALS — BP 132/78 | HR 97 | Ht 63.0 in | Wt 173.0 lb

## 2022-05-03 DIAGNOSIS — M25562 Pain in left knee: Secondary | ICD-10-CM

## 2022-05-03 DIAGNOSIS — M1712 Unilateral primary osteoarthritis, left knee: Secondary | ICD-10-CM | POA: Diagnosis not present

## 2022-05-03 DIAGNOSIS — M5030 Other cervical disc degeneration, unspecified cervical region: Secondary | ICD-10-CM | POA: Diagnosis not present

## 2022-05-03 DIAGNOSIS — G8929 Other chronic pain: Secondary | ICD-10-CM

## 2022-05-03 DIAGNOSIS — M546 Pain in thoracic spine: Secondary | ICD-10-CM

## 2022-05-03 DIAGNOSIS — M5136 Other intervertebral disc degeneration, lumbar region: Secondary | ICD-10-CM | POA: Diagnosis not present

## 2022-05-03 NOTE — Patient Instructions (Addendum)
Good to see you  Tylenol [phone] mg 2-3 times a day for pain relief  Thoracic HEP  4 week follow up

## 2022-05-06 ENCOUNTER — Ambulatory Visit: Payer: Medicare PPO | Admitting: Pulmonary Disease

## 2022-05-08 ENCOUNTER — Encounter: Payer: Self-pay | Admitting: Internal Medicine

## 2022-05-09 ENCOUNTER — Encounter: Payer: Self-pay | Admitting: Gastroenterology

## 2022-05-09 MED ORDER — DEXCOM G7 SENSOR MISC
1.0000 | 3 refills | Status: DC
Start: 2022-05-09 — End: 2023-08-10

## 2022-05-10 ENCOUNTER — Encounter: Payer: Self-pay | Admitting: Adult Health

## 2022-05-10 ENCOUNTER — Encounter: Payer: Self-pay | Admitting: Internal Medicine

## 2022-05-10 ENCOUNTER — Ambulatory Visit: Payer: Medicare PPO | Admitting: Adult Health

## 2022-05-10 VITALS — BP 126/80 | HR 90 | Temp 98.9°F | Ht 63.0 in | Wt 171.0 lb

## 2022-05-10 DIAGNOSIS — E1165 Type 2 diabetes mellitus with hyperglycemia: Secondary | ICD-10-CM | POA: Diagnosis not present

## 2022-05-10 DIAGNOSIS — K7460 Unspecified cirrhosis of liver: Secondary | ICD-10-CM | POA: Diagnosis not present

## 2022-05-10 DIAGNOSIS — R002 Palpitations: Secondary | ICD-10-CM | POA: Diagnosis not present

## 2022-05-10 LAB — COMPREHENSIVE METABOLIC PANEL
ALT: 51 U/L — ABNORMAL HIGH (ref 0–35)
AST: 43 U/L — ABNORMAL HIGH (ref 0–37)
Albumin: 3.7 g/dL (ref 3.5–5.2)
Alkaline Phosphatase: 81 U/L (ref 39–117)
BUN: 25 mg/dL — ABNORMAL HIGH (ref 6–23)
CO2: 25 mEq/L (ref 19–32)
Calcium: 10.1 mg/dL (ref 8.4–10.5)
Chloride: 97 mEq/L (ref 96–112)
Creatinine, Ser: 0.95 mg/dL (ref 0.40–1.20)
GFR: 59.77 mL/min — ABNORMAL LOW (ref >60.00)
Glucose, Bld: 142 mg/dL — ABNORMAL HIGH (ref 70–99)
Potassium: 4.9 mEq/L (ref 3.5–5.1)
Sodium: 130 mEq/L — ABNORMAL LOW (ref 135–145)
Total Bilirubin: 1.6 mg/dL — ABNORMAL HIGH (ref 0.2–1.2)
Total Protein: 7.1 g/dL (ref 6.0–8.3)

## 2022-05-10 LAB — CBC WITH DIFFERENTIAL/PLATELET
Basophils Absolute: 0 10*3/uL (ref 0.0–0.1)
Basophils Relative: 0.5 % (ref 0.0–3.0)
Eosinophils Absolute: 0.2 10*3/uL (ref 0.0–0.7)
Eosinophils Relative: 3.6 % (ref 0.0–5.0)
HCT: 28.4 % — ABNORMAL LOW (ref 36.0–46.0)
Hemoglobin: 9.4 g/dL — ABNORMAL LOW (ref 12.0–15.0)
Lymphocytes Relative: 20.5 % (ref 12.0–46.0)
Lymphs Abs: 0.9 10*3/uL (ref 0.7–4.0)
MCHC: 33 g/dL (ref 30.0–36.0)
MCV: 80 fl (ref 78.0–100.0)
Monocytes Absolute: 0.7 10*3/uL (ref 0.1–1.0)
Monocytes Relative: 15.1 % — ABNORMAL HIGH (ref 3.0–12.0)
Neutro Abs: 2.7 10*3/uL (ref 1.4–7.7)
Neutrophils Relative %: 60.3 % (ref 43.0–77.0)
Platelets: 84 10*3/uL — ABNORMAL LOW (ref 150.0–400.0)
RBC: 3.55 Mil/uL — ABNORMAL LOW (ref 3.87–5.11)
RDW: 17.3 % — ABNORMAL HIGH (ref 11.5–15.5)
WBC: 4.5 10*3/uL (ref 4.0–10.5)

## 2022-05-10 LAB — IBC + FERRITIN
Ferritin: 6.6 ng/mL — ABNORMAL LOW (ref 10.0–291.0)
Iron: 50 ug/dL (ref 42–145)
Saturation Ratios: 8.7 % — ABNORMAL LOW (ref 20.0–50.0)
TIBC: 575.4 ug/dL — ABNORMAL HIGH (ref 250.0–450.0)
Transferrin: 411 mg/dL — ABNORMAL HIGH (ref 212.0–360.0)

## 2022-05-10 LAB — MAGNESIUM: Magnesium: 1.5 mg/dL (ref 1.5–2.5)

## 2022-05-10 LAB — PHOSPHORUS: Phosphorus: 3.8 mg/dL (ref 2.3–4.6)

## 2022-05-10 LAB — TSH: TSH: 2.53 u[IU]/mL (ref 0.35–5.50)

## 2022-05-10 NOTE — Progress Notes (Signed)
Subjective:    Patient ID: Dana Dyer, female    DOB: [birth year], 71 y.o.   MRN: [id number]  HPI 71 year old female who  has a past medical history of ALLERGIC RHINITIS (10/09/2007), Allergy, Arthritis, DIABETES MELLITUS, UNCONTROLLED (03/16/2009), FATIGUE (10/21/2008), FIBROMYALGIA (10/09/2007), GERD (gastroesophageal reflux disease), H/O vaginal hysterectomy, HYPERLIPIDEMIA (10/09/2007), HYPERTENSION (10/09/2007), Morton's neuroma, OBSTRUCTIVE SLEEP APNEA (10/09/2007), Plantar fasciitis, Sleep apnea, and TRANSAMINASES, SERUM, ELEVATED (07/08/2009).  She is a patient of Dr. Elease Hashimoto who I am seeing today for concern of rapid heart rate with activity. She reports having 5-6 episodes per day and has been consistent for the last few months. She noticed more when she gets out of the shower and when walking up an incline and when she goes to bed at night   She checked her blood pressure yesterday during an " episode" and it was 105/50's.   She denies chest pain, shortness of breath, dizziness, lightheadedness.   She is on lasix 20 mg every other day and spirolactone every other day d/t history of cirrhosis.   BP Readings from Last 3 Encounters:  05/10/22 126/80  05/03/22 132/78  04/11/22 110/70   Review of Systems See HPI   Past Medical History:  Diagnosis Date   ALLERGIC RHINITIS 10/09/2007   Allergy    Arthritis    hips, spine   DIABETES MELLITUS, UNCONTROLLED 03/16/2009   FATIGUE 10/21/2008   FIBROMYALGIA 10/09/2007   GERD (gastroesophageal reflux disease)    diet  controlled, no meds   H/O vaginal hysterectomy    HYPERLIPIDEMIA 10/09/2007   diet controlled, no meds   HYPERTENSION 10/09/2007   Morton's neuroma    OBSTRUCTIVE SLEEP APNEA 10/09/2007   Plantar fasciitis    History - right   Sleep apnea    Uses CPAP   TRANSAMINASES, SERUM, ELEVATED 07/08/2009    Social History   Socioeconomic History   Marital status: Married    Spouse name: Not on file   Number of children:  Not on file   Years of education: Not on file   Highest education level: Not on file  Occupational History   Not on file  Tobacco Use   Smoking status: Never   Smokeless tobacco: Never  Vaping Use   Vaping Use: Never used  Substance and Sexual Activity   Alcohol use: No    Alcohol/week: 0.0 standard drinks   Drug use: No   Sexual activity: Yes    Birth control/protection: Other-see comments, Post-menopausal    Comment: Hysterectomy  Other Topics Concern   Not on file  Social History Narrative   Not on file   Social Determinants of Health   Financial Resource Strain: Low Risk    Difficulty of Paying Living Expenses: Not hard at all  Food Insecurity: No Food Insecurity   Worried About Charity fundraiser in the Last Year: Never true   Arboriculturist in the Last Year: Never true  Transportation Needs: No Transportation Needs   Lack of Transportation (Medical): No   Lack of Transportation (Non-Medical): No  Physical Activity: Insufficiently Active   Days of Exercise per Week: 3 days   Minutes of Exercise per Session: 30 min  Stress: No Stress Concern Present   Feeling of Stress : Not at all  Social Connections: Unknown   Frequency of Communication with Friends and Family: Three times a week   Frequency of Social Gatherings with Friends and Family: Not on file   Attends Religious  Services: Not on file   Active Member of Clubs or Organizations: No   Attends Archivist Meetings: Never   Marital Status: Married  Human resources officer Violence: Not At Risk   Fear of Current or Ex-Partner: No   Emotionally Abused: No   Physically Abused: No   Sexually Abused: No    Past Surgical History:  Procedure Laterality Date   ABDOMINAL HYSTERECTOMY  1979   prolapsed uterus   BASAL CELL CARCINOMA EXCISION     BREAST BIOPSY Left 2012   left 2012 stereo   BREAST REDUCTION SURGERY Bilateral 1992   COLONOSCOPY  2006   [place]   mamoplasty     reduction   REDUCTION  MAMMAPLASTY Bilateral [place]    Family History  Problem Relation Age of Onset   Diabetes Mother    Arthritis Mother    Cancer Mother        breast   Breast cancer Mother 64   Alcohol abuse Father    Arthritis Father    Hypertension Maternal Grandfather    Miscarriages / Stillbirths Paternal Grandfather    Breast cancer Maternal Aunt        diagnosed in her 75's   Colon polyps Neg Hx    Colon cancer Neg Hx    Esophageal cancer Neg Hx    Rectal cancer Neg Hx    Stomach cancer Neg Hx     Allergies  Allergen Reactions   Amoxicillin-Pot Clavulanate     Severe diarrhea   Codeine Sulfate     REACTION: "hyper"   Diflucan [Fluconazole] Hives   Erythromycin Base    Invokana [Canagliflozin] Other (See Comments)    Recurrent yeast infections   Irbesartan-Hydrochlorothiazide    Latex    Victoza [Liraglutide] Nausea Only   Ozempic (0.25 Or 0.5 Mg-Dose) [Semaglutide(0.25 Or 0.70m-Dos)] Nausea Only    Current Outpatient Medications on File Prior to Visit  Medication Sig Dispense Refill   amLODipine (NORVASC) 5 MG tablet Take 1 tablet (5 mg total) by mouth daily. 90 tablet 0   BD PEN NEEDLE NANO 2ND GEN 32G X 4 MM MISC USE AS DIRECTED 400 each 3   Continuous Blood Gluc Sensor (DEXCOM G7 SENSOR) MISC 1 Device by Does not apply route as directed. Every 10 days 9 each 3   escitalopram (LEXAPRO) 10 MG tablet TAKE 1 TABLET BY MOUTH EVERY DAY 90 tablet 0   famotidine (PEPCID) 20 MG tablet Take 20 mg by mouth at bedtime.     furosemide (LASIX) 20 MG tablet TAKE 1 TABLET BY MOUTH EVERY DAY (Patient taking differently: Every other day) 90 tablet 3   insulin glargine (LANTUS SOLOSTAR) 100 UNIT/ML Solostar Pen Inject 30 Units into the skin daily. (Patient taking differently: Inject 33 Units into the skin daily.) 45 mL 3   insulin lispro (HUMALOG KWIKPEN) 200 UNIT/ML KwikPen Max Daily 120 30 mL 3   KLOR-CON M20 20 MEQ tablet TAKE 1 TABLET BY MOUTH  EVERY DAY 90 tablet 1   lactulose (CHRONULAC) 10 GM/15ML solution TAKE 6 TABLESPOONS DAILY (Patient taking differently: TAKE 3 TABLESPOONS DAILY) 2430 mL 1   losartan (COZAAR) 100 MG tablet TAKE 1 TABLET BY MOUTH EVERY DAY 90 tablet 0   metFORMIN (GLUCOPHAGE) 1000 MG tablet TAKE 1 TABLET BY MOUTH TWICE A DAY 180 tablet 3   spironolactone (ALDACTONE) 50 MG tablet Take 1 tablet (50 mg total) by mouth every other day.  30 tablet 11   Current Facility-Administered Medications on File Prior to Visit  Medication Dose Route Frequency Provider Last Rate Last Admin   TDaP (BOOSTRIX) injection 0.5 mL  0.5 mL Intramuscular Once Burchette, Alinda Sierras, MD        BP 126/80   Pulse 90   Temp 98.9 F (37.2 C) (Oral)   Ht 5' 3"  (1.6 m)   Wt 171 lb (77.6 kg)   SpO2 99%   BMI 30.29 kg/m       Objective:   Physical Exam Vitals and nursing note reviewed.  Constitutional:      Appearance: Normal appearance.  Cardiovascular:     Rate and Rhythm: Normal rate and regular rhythm.     Pulses: Normal pulses.     Heart sounds: Normal heart sounds.  Pulmonary:     Effort: Pulmonary effort is normal.     Breath sounds: Normal breath sounds.  Musculoskeletal:        General: Normal range of motion.  Skin:    General: Skin is warm and dry.     Capillary Refill: Capillary refill takes less than 2 seconds.  Neurological:     General: No focal deficit present.     Mental Status: She is alert and oriented to person, place, and time.  Psychiatric:        Mood and Affect: Mood normal.        Behavior: Behavior normal.        Thought Content: Thought content normal.        Judgment: Judgment normal.       Assessment & Plan:  1. Palpitations  - TSH; Future - CBC with Differential/Platelet; Future - EKG 12-Lead - Ambulatory referral to Cardiology- SR, rate 84.  - Will refer to Cardiology for possible stress test.   Dorothyann Peng, NP

## 2022-05-10 NOTE — Patient Instructions (Signed)
It was great seeing you today   I am going to check some blood work on you today   Someone from cardiology will call you to schedule

## 2022-05-11 ENCOUNTER — Telehealth: Payer: Self-pay | Admitting: Family Medicine

## 2022-05-11 NOTE — Telephone Encounter (Signed)
Pt seen cory yesterday and has viewed her result on mychart and would like a callback to go over the results. Pt has ? About electrolytes

## 2022-05-12 DIAGNOSIS — E1165 Type 2 diabetes mellitus with hyperglycemia: Secondary | ICD-10-CM | POA: Diagnosis not present

## 2022-05-13 ENCOUNTER — Telehealth: Payer: Self-pay | Admitting: Internal Medicine

## 2022-05-13 NOTE — Telephone Encounter (Signed)
Spoke to the pt on 05/13/2022  She was seen by PCP's office 05/10/2022 for palpitations  EKG showed normal sinus rhythm at 84 bpm Her labs showed low sodium, stable anemia, improved GFR   Per patient she has been having palpitations as well as being easily exerted  No chest pain she has a pending referral to cardiology through her PCPs office  She had checked her BP at home and initially was 110/60 but during this episode she has noted a BP of 95/40 mmHg   She has been alternating furosemide 20 mg every other day, and spironolactone 50 mg every other day   Her GI specialist is out of town for~3 weeks   I have advised the patient to take half a tablet of furosemide every other day, but continue spironolactone 50 mg every other day  I did assure her that her GFR is improving, she has normal thyroid function, and stable anemia   I did explain to her that her sodium is lower than it has been, she has been trying to limit liquid intake   Patient to follow-up with GI for optimization of diuretic therapy  Patient expressed understanding

## 2022-05-13 NOTE — Telephone Encounter (Signed)
Patient called to advise that her blood pressure is bottoming out and her PCP is out of the office.  Patient is not established yet with Cardiologist.  Patient requesting assistance with BP as she is afraid and going out of town Requested to speak with provider.  Call back # [phone]

## 2022-05-26 DIAGNOSIS — Z85828 Personal history of other malignant neoplasm of skin: Secondary | ICD-10-CM | POA: Diagnosis not present

## 2022-05-26 DIAGNOSIS — D485 Neoplasm of uncertain behavior of skin: Secondary | ICD-10-CM | POA: Diagnosis not present

## 2022-05-26 DIAGNOSIS — C4441 Basal cell carcinoma of skin of scalp and neck: Secondary | ICD-10-CM | POA: Diagnosis not present

## 2022-05-27 ENCOUNTER — Encounter: Payer: Self-pay | Admitting: Gastroenterology

## 2022-05-27 ENCOUNTER — Ambulatory Visit: Payer: Medicare PPO | Admitting: Gastroenterology

## 2022-05-27 ENCOUNTER — Other Ambulatory Visit (INDEPENDENT_AMBULATORY_CARE_PROVIDER_SITE_OTHER): Payer: Medicare PPO

## 2022-05-27 VITALS — BP 110/50 | HR 97 | Ht 63.0 in | Wt 175.0 lb

## 2022-05-27 DIAGNOSIS — K7460 Unspecified cirrhosis of liver: Secondary | ICD-10-CM | POA: Diagnosis not present

## 2022-05-27 LAB — COMPREHENSIVE METABOLIC PANEL
ALT: 42 U/L — ABNORMAL HIGH (ref 0–35)
AST: 43 U/L — ABNORMAL HIGH (ref 0–37)
Albumin: 3.7 g/dL (ref 3.5–5.2)
Alkaline Phosphatase: 89 U/L (ref 39–117)
BUN: 17 mg/dL (ref 6–23)
CO2: 23 mEq/L (ref 19–32)
Calcium: 9.7 mg/dL (ref 8.4–10.5)
Chloride: 97 mEq/L (ref 96–112)
Creatinine, Ser: 0.83 mg/dL (ref 0.40–1.20)
GFR: 70.26 mL/min (ref >60.00)
Glucose, Bld: 134 mg/dL — ABNORMAL HIGH (ref 70–99)
Potassium: 4.5 mEq/L (ref 3.5–5.1)
Sodium: 130 mEq/L — ABNORMAL LOW (ref 135–145)
Total Bilirubin: 1.7 mg/dL — ABNORMAL HIGH (ref 0.2–1.2)
Total Protein: 7 g/dL (ref 6.0–8.3)

## 2022-05-27 LAB — CBC
HCT: 27.8 % — ABNORMAL LOW (ref 36.0–46.0)
Hemoglobin: 9.3 g/dL — ABNORMAL LOW (ref 12.0–15.0)
MCHC: 33.4 g/dL (ref 30.0–36.0)
MCV: 79.4 fl (ref 78.0–100.0)
Platelets: 103 10*3/uL — ABNORMAL LOW (ref 150.0–400.0)
RBC: 3.51 Mil/uL — ABNORMAL LOW (ref 3.87–5.11)
RDW: 15.6 % — ABNORMAL HIGH (ref 11.5–15.5)
WBC: 4.5 10*3/uL (ref 4.0–10.5)

## 2022-05-27 LAB — PROTIME-INR
INR: 1.1 ratio — ABNORMAL HIGH (ref 0.8–1.0)
Prothrombin Time: 12.5 s (ref 9.6–13.1)

## 2022-05-30 LAB — AFP TUMOR MARKER: AFP-Tumor Marker: 5.9 ng/mL

## 2022-05-31 ENCOUNTER — Encounter: Payer: Self-pay | Admitting: Cardiovascular Disease

## 2022-05-31 ENCOUNTER — Ambulatory Visit: Payer: Medicare PPO | Admitting: Cardiovascular Disease

## 2022-05-31 VITALS — BP 118/60 | HR 84 | Ht 63.0 in | Wt 172.0 lb

## 2022-05-31 DIAGNOSIS — M79622 Pain in left upper arm: Secondary | ICD-10-CM

## 2022-05-31 DIAGNOSIS — I10 Essential (primary) hypertension: Secondary | ICD-10-CM | POA: Diagnosis not present

## 2022-05-31 DIAGNOSIS — R011 Cardiac murmur, unspecified: Secondary | ICD-10-CM

## 2022-05-31 DIAGNOSIS — M79621 Pain in right upper arm: Secondary | ICD-10-CM

## 2022-05-31 DIAGNOSIS — R0609 Other forms of dyspnea: Secondary | ICD-10-CM

## 2022-05-31 DIAGNOSIS — R55 Syncope and collapse: Secondary | ICD-10-CM | POA: Diagnosis not present

## 2022-05-31 DIAGNOSIS — R42 Dizziness and giddiness: Secondary | ICD-10-CM | POA: Diagnosis not present

## 2022-05-31 DIAGNOSIS — R5383 Other fatigue: Secondary | ICD-10-CM

## 2022-05-31 MED ORDER — METOPROLOL TARTRATE 100 MG PO TABS: ORAL_TABLET | ORAL | 0 refills | Status: DC

## 2022-05-31 NOTE — Progress Notes (Addendum)
Cardiology Office Note:    Date:  05/31/2022   ID:  Dana Dyer, DOB [birth year], MRN [id number]  PCP:  Eulas Post, MD   [facility] Providers Cardiologist:    }    Referring MD: Dorothyann Peng, NP   Chief Complaint  Patient presents with   Hypotension   Palpitations    History of Present Illness:    Dana Dyer is a 71 y.o. female with a hx of HTN ( formerly) , DM Has been told she is dehydrated by her rheumatologist  Now is here with hypotension episodes   Has NASH Is on Aldactone 50 for her cirrhosis  Is also on Losartan 100 ( for her DM)  Amlodipine 5 mg a day  No CP or dyspnea  with exertion  Gets tired in her arms when she walks up hill  No chest tightness, no intrascapular pain  Does have presyncope with exertion  No real symptoms at rest.   Has had DM for ~ 20 years  Some of her symptoms are suggestive of CAD ( Exertional arm fatigue, dyspnea   Past Medical History:  Diagnosis Date   ALLERGIC RHINITIS 10/09/2007   Allergy    Arthritis    hips, spine   DIABETES MELLITUS, UNCONTROLLED 03/16/2009   FATIGUE 10/21/2008   FIBROMYALGIA 10/09/2007   GERD (gastroesophageal reflux disease)    diet  controlled, no meds   H/O vaginal hysterectomy    HYPERLIPIDEMIA 10/09/2007   diet controlled, no meds   HYPERTENSION 10/09/2007   Morton's neuroma    OBSTRUCTIVE SLEEP APNEA 10/09/2007   Palpitations    Plantar fasciitis    History - right   Sleep apnea    Uses CPAP   TRANSAMINASES, SERUM, ELEVATED 07/08/2009    Past Surgical History:  Procedure Laterality Date   ABDOMINAL HYSTERECTOMY  1979   prolapsed uterus   BASAL CELL CARCINOMA EXCISION     BREAST BIOPSY Left 2012   left 2012 stereo   BREAST REDUCTION SURGERY Bilateral 1992   COLONOSCOPY  2006   [facility]   mamoplasty     reduction   REDUCTION MAMMAPLASTY Bilateral 1992   TONSILLECTOMY  1959   TUBAL LIGATION  1976    Current Medications: Current Meds  Medication  Sig   BD PEN NEEDLE NANO 2ND GEN 32G X 4 MM MISC USE AS DIRECTED   Continuous Blood Gluc Sensor (DEXCOM G7 SENSOR) MISC 1 Device by Does not apply route as directed. Every 10 days   escitalopram (LEXAPRO) 10 MG tablet TAKE 1 TABLET BY MOUTH EVERY DAY   famotidine (PEPCID) 20 MG tablet Take 20 mg by mouth at bedtime.   furosemide (LASIX) 20 MG tablet TAKE 1 TABLET BY MOUTH EVERY DAY (Patient taking differently: Take 10 mg by mouth daily.)   insulin glargine (LANTUS SOLOSTAR) 100 UNIT/ML Solostar Pen Inject 30 Units into the skin daily.   insulin lispro (HUMALOG KWIKPEN) 200 UNIT/ML KwikPen Max Daily 120   KLOR-CON M20 20 MEQ tablet TAKE 1 TABLET BY MOUTH EVERY DAY   lactulose (CHRONULAC) 10 GM/15ML solution TAKE 6 TABLESPOONS DAILY (Patient taking differently: TAKE 3 TABLESPOONS DAILY)   losartan (COZAAR) 100 MG tablet TAKE 1 TABLET BY MOUTH EVERY DAY   metFORMIN (GLUCOPHAGE) 1000 MG tablet TAKE 1 TABLET BY MOUTH TWICE A DAY   metoprolol tartrate (LOPRESSOR) 100 MG tablet Take 1 tablet by mouth two hours prior to scan   spironolactone (ALDACTONE) 50 MG tablet Take 1 tablet (  50 mg total) by mouth every other day.   [DISCONTINUED] amLODipine (NORVASC) 5 MG tablet Take 1 tablet (5 mg total) by mouth daily.   Current Facility-Administered Medications for the 05/31/22 encounter (Office Visit) with , Wonda Cheng, MD  Medication   TDaP (BOOSTRIX) injection 0.5 mL     Allergies:   Amoxicillin-pot clavulanate, Codeine sulfate, Diflucan [fluconazole], Erythromycin base, Invokana [canagliflozin], Irbesartan-hydrochlorothiazide, Latex, Victoza [liraglutide], and Ozempic (0.25 or 0.5 mg-dose) [semaglutide(0.25 or 0.86m-dos)]   Social History   Socioeconomic History   Marital status: Married    Spouse name: Not on file   Number of children: Not on file   Years of education: Not on file   Highest education level: Not on file  Occupational History   Not on file  Tobacco Use   Smoking status: Never    Smokeless tobacco: Never  Vaping Use   Vaping Use: Never used  Substance and Sexual Activity   Alcohol use: No    Alcohol/week: 0.0 standard drinks of alcohol   Drug use: No   Sexual activity: Yes    Birth control/protection: Other-see comments, Post-menopausal    Comment: Hysterectomy  Other Topics Concern   Not on file  Social History Narrative   Not on file   Social Determinants of Health   Financial Resource Strain: Low Risk  (08/04/2021)   Overall Financial Resource Strain (CARDIA)    Difficulty of Paying Living Expenses: Not hard at all  Food Insecurity: No Food Insecurity (08/04/2021)   Hunger Vital Sign    Worried About Running Out of Food in the Last Year: Never true    Ran Out of Food in the Last Year: Never true  Transportation Needs: No Transportation Needs (08/04/2021)   PRAPARE - THydrologist(Medical): No    Lack of Transportation (Non-Medical): No  Physical Activity: Insufficiently Active (08/04/2021)   Exercise Vital Sign    Days of Exercise per Week: 3 days    Minutes of Exercise per Session: 30 min  Stress: No Stress Concern Present (08/04/2021)   [place]   Feeling of Stress : Not at all  Social Connections: Unknown (08/04/2021)   Social Connection and Isolation Panel [NHANES]    Frequency of Communication with Friends and Family: Three times a week    Frequency of Social Gatherings with Friends and Family: Not on file    Attends Religious Services: Not on file    Active Member of Clubs or Organizations: No    Attends CArchivistMeetings: Never    Marital Status: Married     Family History: The patient's family history includes Alcohol abuse in her father; Arthritis in her father and mother; Breast cancer in her maternal aunt; Breast cancer (age of onset: 745 in her mother; Cancer in her mother; Diabetes in her mother; Hypertension in her maternal  grandfather; Miscarriages / SKoreain her paternal grandfather. There is no history of Colon polyps, Colon cancer, Esophageal cancer, Rectal cancer, or Stomach cancer.  ROS:   Please see the history of present illness.     All other systems reviewed and are negative.  EKGs/Labs/Other Studies Reviewed:    The following studies were reviewed today:   EKG:  05/10/22- NSR  84.  Poor R wave progression.   Likely lead placement   Recent Labs: 05/10/2022: Magnesium 1.5; TSH 2.53 05/27/2022: ALT 42; BUN 17; Creatinine, Ser 0.83; Hemoglobin 9.3; Platelets 103.0;  Potassium 4.5; Sodium 130  Recent Lipid Panel    Component Value Date/Time   CHOL 154 09/30/2021 1048   TRIG 128.0 09/30/2021 1048   HDL 59.30 09/30/2021 1048   CHOLHDL 3 09/30/2021 1048   VLDL 25.6 09/30/2021 1048   LDLCALC 69 09/30/2021 1048   LDLDIRECT 124.9 10/14/2010 0816     Risk Assessment/Calculations:           Physical Exam:    VS:  BP 118/60   Pulse 84   Ht 5' 3"  (1.6 m)   Wt 172 lb (78 kg)   SpO2 94%   BMI 30.47 kg/m     Wt Readings from Last 3 Encounters:  05/31/22 172 lb (78 kg)  05/27/22 175 lb (79.4 kg)  05/10/22 171 lb (77.6 kg)     GEN:  Well nourished, well developed in no acute distress HEENT: Normal NECK: No JVD; No carotid bruits LYMPHATICS: No lymphadenopathy CARDIAC: RRR, ,  2/6 systolic murmur at ULSB .  Does not  seem to radiate to the L axillary  RESPIRATORY:  Clear to auscultation without rales, wheezing or rhonchi  ABDOMEN: Soft, non-tender, non-distended MUSCULOSKELETAL:  No edema; No deformity  SKIN: Warm and dry NEUROLOGIC:  Alert and oriented x 3 PSYCHIATRIC:  Normal affect   ASSESSMENT:    1. Postural dizziness with presyncope   2. Primary hypertension   3. Other fatigue   4. Murmur   5. Pain in both upper arms   6. DOE (dyspnea on exertion)    PLAN:    In order of problems listed above:  Hypotension: She is on numerous medications that could contribute to her  hypotension.  We will discontinue the amlodipine.  We will continue the diuretics since they will be useful in volume control in the setting of nonalcoholic liver cirrhosis.  We will also continue the losartan since there is a benefit to diabetics. Exercise intolerance: She is having arm fatigue and severe shortness of breath with exertion.  She has a long history of diabetes.  She may have developed some coronary artery disease.  We will perform a coronary CT angiogram for further evaluation.  3.  Systolic murmur: She has a soft murmur on exam.  Sounds like she may have some tricuspid regurgitation and/or aortic stenosis.  She also has had some generalized exercise intolerance.  We will get an echocardiogram for further evaluation.  We will see her again in 3 months for follow-up visit      Shared Decision Making/Informed Consent{   Medication Adjustments/Labs and Tests Ordered: Current medicines are reviewed at length with the patient today.  Concerns regarding medicines are outlined above.  Orders Placed This Encounter  Procedures   CT CORONARY MORPH W/CTA COR W/SCORE W/CA W/CM &/OR WO/CM   ECHOCARDIOGRAM COMPLETE   Meds ordered this encounter  Medications   metoprolol tartrate (LOPRESSOR) 100 MG tablet    Sig: Take 1 tablet by mouth two hours prior to scan    Dispense:  1 tablet    Refill:  0    Patient Instructions  Medication Instructions:  STOP Amlodipine *If you need a refill on your cardiac medications before your next appointment, please call your pharmacy*   Lab Work: NONE If you have labs (blood work) drawn today and your tests are completely normal, you will receive your results only by: MyChart Message (if you have MyChart) OR A paper copy in the mail If you have any lab test that is abnormal or  we need to change your treatment, we will call you to review the results.   Testing/Procedures: ECHO Your physician has requested that you have an echocardiogram.  Echocardiography is a painless test that uses sound waves to create images of your heart. It provides your doctor with information about the size and shape of your heart and how well your heart's chambers and valves are working. This procedure takes approximately one hour. There are no restrictions for this procedure.  Coronary CT scan Your physician has requested that you have cardiac CT. Cardiac computed tomography (CT) is a painless test that uses an x-ray machine to take clear, detailed pictures of your heart. For further information please visit [email or web address]. Please follow instruction sheet as given.  Follow-Up: At [facility], you and your health needs are our priority.  As part of our continuing mission to provide you with exceptional heart care, we have created designated Provider Care Teams.  These Care Teams include your primary Cardiologist (physician) and Advanced Practice Providers (APPs -  Physician Assistants and Nurse Practitioners) who all work together to provide you with the care you need, when you need it.  Your next appointment:   3 month(s)  The format for your next appointment:   In Person  Provider:   Ronn Melena or  {  Other Instructions   Your cardiac CT will be scheduled at one of the below locations:   [facility] [address] [place], [place] [address] [phone]  please arrive at the [facility] and Children's Entrance (Entrance C2) of [facility] 30 minutes prior to test start time. You can use the FREE valet parking offered at entrance C (encouraged to control the heart rate for the test)  Proceed to the [facility] Radiology Department (first floor) to check-in and test prep.  All radiology patients and guests should use entrance C2 at [facility], accessed from [facility], even though the hospital's physical address listed is [address].    Please follow these instructions  carefully (unless otherwise directed):  On the Night Before the Test: Be sure to Drink plenty of water. Do not consume any caffeinated/decaffeinated beverages or chocolate 12 hours prior to your test. Do not take any antihistamines 12 hours prior to your test.  On the Day of the Test: Drink plenty of water until 1 hour prior to the test. Do not eat any food 4 hours prior to the test. You may take your regular medications prior to the test.  Take metoprolol (Lopressor) two hours prior to test. HOLD Furosemide morning of the test. FEMALES- please wear underwire-free bra if available, avoid dresses & tight clothing      After the Test: Drink plenty of water. After receiving IV contrast, you may experience a mild flushed feeling. This is normal. On occasion, you may experience a mild rash up to 24 hours after the test. This is not dangerous. If this occurs, you can take Benadryl 25 mg and increase your fluid intake. If you experience trouble breathing, this can be serious. If it is severe call 911 IMMEDIATELY. If it is mild, please call our office. If you take any of these medications: Glipizide/Metformin, Avandament, Glucavance, please do not take 48 hours after completing test unless otherwise instructed.  We will call to schedule your test 2-4 weeks out understanding that some insurance companies will need an authorization prior to the service being performed.   For non-scheduling related questions, please contact  the cardiac imaging nurse navigator should you have any questions/concerns: Marchia Bond, Cardiac Imaging Nurse Navigator Gordy Clement, Cardiac Imaging Nurse Navigator [facility] Direct Office Dial: [phone]   For scheduling needs, including cancellations and rescheduling, please call [place], [phone].   Important Information About Sugar         Signed, Mertie Moores, MD  05/31/2022 5:59 PM    [facility]  HeartCare

## 2022-06-01 NOTE — Progress Notes (Signed)
Dana Dyer D.[employer] [place] Phone: [phone]  Telemedicine encounter   Assessment and Plan:     1. Chronic bilateral thoracic back pain 2. Acute pain of left knee - significant improvements in both low back pain and knee pain - continue tylenol as needed  Time of visit 12 minutes, which included telephone discussion, chart review, treatment plan discussion with patient, and documentation at today's telemedicine visit.  Pertinent previous records reviewed include none   Follow Up: as needed. If pain returns, Could obtain imaging and discuss PT versus advanced imaging vs CSI at that time    Subjective:   I, Dana Dyer, am serving as a Education administrator for Doctor Dana Dyer   Chief Complaint: neck pain    HPI:  10/05/2021 Dana Dyer is a 71 y.o. female coming in with complaint of low back pain.  Last time I saw patient was greater than a year ago.  Patient has been on Cymbalta.  Been doing home exercises.  Patient states LBP did well for about 6-9 months. Fitness walking started the pain. Can't do the hills. Neck pain when turning head left and right. Flexion and extension also hurts. Sharp catching type pains, while back is more throbbing. Ibuprofen takes the edge off. Only takes it when she has to because of previous medical problems.   03/16/22 Patient states neck mid back and lower back , started about 6 weeks ago, went to [employer] and that didn't help, her back feels like its in a body cast , isnt able to play golf and wants to get back to it walking and swinging really puts her in pain, hasnt been able to do exercises, does get some numbness and tingling that will go down to her mid but cheek does get tingling in her fingers has to keep her arms straight at night, has non alcoholic sorosis would like an injection, has ben trying to take as little advil as she can , has tried topical creams that dont  help has spinal stenosis got a little better but now its sore all over the "frame "and thoracic pain seems to be carrying the tension of it and it travels to the neck has so much tension that needs to be released ice doesn't phase it but hasnt used heat    04/11/2022 Patient states that she hurt her L knee a month ago and her dog ran away from her she caught her dog and noticed that she did a twisting motion  had hurt her knee she has been icing and then taking meloxicam and had some stomach discomfort she doesn't get any numbness or tingling just pain    05/03/2022 Patient states that the lowe back has improved along with the neck . The mid back is still giving her fits , has pain when cooking and loading and unloading the dishwasher she can feel a ball of fire when she was laying down this morning   06/08/2022 Patient states her back and knee are doing great. No daily pain. Not using any medication currently.        Relevant Historical Information: C-spine DDD, lumbar spine DDD, hypertension, DM type II, NAFLD  Additional pertinent review of systems negative.   Current Outpatient Medications:    BD PEN NEEDLE NANO 2ND GEN 32G X 4 MM MISC, USE AS DIRECTED, Disp: 400 each, Rfl: 3   Continuous Blood Gluc Sensor (DEXCOM G7 SENSOR) MISC, 1  Device by Does not apply route as directed. Every 10 days, Disp: 9 each, Rfl: 3   escitalopram (LEXAPRO) 10 MG tablet, TAKE 1 TABLET BY MOUTH EVERY DAY, Disp: 90 tablet, Rfl: 0   famotidine (PEPCID) 20 MG tablet, Take 20 mg by mouth at bedtime., Disp: , Rfl:    furosemide (LASIX) 20 MG tablet, TAKE 1 TABLET BY MOUTH EVERY DAY (Patient taking differently: Take 10 mg by mouth daily.), Disp: 90 tablet, Rfl: 3   insulin glargine (LANTUS SOLOSTAR) 100 UNIT/ML Solostar Pen, Inject 30 Units into the skin daily., Disp: 45 mL, Rfl: 3   insulin lispro (HUMALOG KWIKPEN) 200 UNIT/ML KwikPen, Max Daily 120, Disp: 30 mL, Rfl: 3   KLOR-CON M20 20 MEQ tablet, TAKE 1 TABLET BY  MOUTH EVERY DAY, Disp: 90 tablet, Rfl: 1   lactulose (CHRONULAC) 10 GM/15ML solution, TAKE 6 TABLESPOONS DAILY (Patient taking differently: TAKE 3 TABLESPOONS DAILY), Disp: 2430 mL, Rfl: 1   losartan (COZAAR) 100 MG tablet, TAKE 1 TABLET BY MOUTH EVERY DAY, Disp: 90 tablet, Rfl: 0   metFORMIN (GLUCOPHAGE) 1000 MG tablet, TAKE 1 TABLET BY MOUTH TWICE A DAY, Disp: 180 tablet, Rfl: 3   metoprolol tartrate (LOPRESSOR) 100 MG tablet, Take 1 tablet by mouth two hours prior to scan, Disp: 1 tablet, Rfl: 0   omeprazole (PRILOSEC) 20 MG capsule, Take 1 capsule (20 mg total) by mouth daily., Disp: 90 capsule, Rfl: 3   spironolactone (ALDACTONE) 50 MG tablet, Take 1 tablet (50 mg total) by mouth every other day., Disp: 30 tablet, Rfl: 11  Current Facility-Administered Medications:    TDaP (BOOSTRIX) injection 0.5 mL, 0.5 mL, Intramuscular, Once, Burchette, Dana Sierras, MD   Electronically signed by:  Dana Dyer D.[facility] 12:57 PM 06/08/22

## 2022-06-02 ENCOUNTER — Telehealth: Payer: Self-pay

## 2022-06-02 ENCOUNTER — Other Ambulatory Visit: Payer: Self-pay

## 2022-06-02 DIAGNOSIS — D509 Iron deficiency anemia, unspecified: Secondary | ICD-10-CM

## 2022-06-02 NOTE — Telephone Encounter (Signed)
Returned call to patient and she stated she would rather Dr Ardis Hughs be in charge of prescribing a PPI as Dr Ardis Hughs would be more aware of what medications she can take due to her liver disease.

## 2022-06-02 NOTE — Telephone Encounter (Signed)
Phone call made to patient to discuss referral to Hematology.  While on the phone with the patient, she mentioned that she was at the dentist yesterday and the dentist mentioned "silent reflux" causing issues with her teeth.  Patient needs to have a root canal.  Dentist told the patient that the issues she had at her cleaning yesterday was not present 3 months ago.  Dentist is concerned that "silent reflux" is causing the decay in her teeth, and she should be on a PPI.  Please advise.

## 2022-06-03 ENCOUNTER — Other Ambulatory Visit: Payer: Self-pay | Admitting: Internal Medicine

## 2022-06-03 MED ORDER — OMEPRAZOLE 20 MG PO CPDR: 20.0000 mg | DELAYED_RELEASE_CAPSULE | Freq: Every day | ORAL | 3 refills | Status: DC

## 2022-06-03 NOTE — Telephone Encounter (Signed)
Patient prefers to have Rx sent into the pharmacy.  Rx sent to pharmacy as directed by Dr Ardis Hughs.  Patient aware and verbalized understanding.

## 2022-06-08 ENCOUNTER — Ambulatory Visit (INDEPENDENT_AMBULATORY_CARE_PROVIDER_SITE_OTHER): Payer: Medicare PPO | Admitting: Sports Medicine

## 2022-06-08 DIAGNOSIS — M25562 Pain in left knee: Secondary | ICD-10-CM

## 2022-06-08 DIAGNOSIS — G8929 Other chronic pain: Secondary | ICD-10-CM | POA: Diagnosis not present

## 2022-06-08 DIAGNOSIS — M546 Pain in thoracic spine: Secondary | ICD-10-CM

## 2022-06-09 ENCOUNTER — Ambulatory Visit (HOSPITAL_COMMUNITY)
Admission: RE | Admit: 2022-06-09 | Discharge: 2022-06-09 | Disposition: A | Discharge status: Home / Self Care | Payer: Medicare PPO | Source: Ambulatory Visit | Attending: Gastroenterology | Admitting: Gastroenterology

## 2022-06-09 DIAGNOSIS — R188 Other ascites: Secondary | ICD-10-CM | POA: Diagnosis not present

## 2022-06-09 DIAGNOSIS — K7460 Unspecified cirrhosis of liver: Secondary | ICD-10-CM | POA: Insufficient documentation

## 2022-06-09 DIAGNOSIS — K824 Cholesterolosis of gallbladder: Secondary | ICD-10-CM | POA: Diagnosis not present

## 2022-06-13 ENCOUNTER — Ambulatory Visit (HOSPITAL_COMMUNITY): Payer: Medicare PPO | Attending: Cardiology

## 2022-06-13 ENCOUNTER — Encounter: Payer: Self-pay | Admitting: Gastroenterology

## 2022-06-13 DIAGNOSIS — R5383 Other fatigue: Secondary | ICD-10-CM | POA: Insufficient documentation

## 2022-06-13 DIAGNOSIS — I10 Essential (primary) hypertension: Secondary | ICD-10-CM | POA: Insufficient documentation

## 2022-06-13 DIAGNOSIS — I517 Cardiomegaly: Secondary | ICD-10-CM

## 2022-06-13 DIAGNOSIS — M79622 Pain in left upper arm: Secondary | ICD-10-CM | POA: Insufficient documentation

## 2022-06-13 DIAGNOSIS — R0609 Other forms of dyspnea: Secondary | ICD-10-CM | POA: Diagnosis not present

## 2022-06-13 DIAGNOSIS — M79621 Pain in right upper arm: Secondary | ICD-10-CM | POA: Diagnosis not present

## 2022-06-13 DIAGNOSIS — R55 Syncope and collapse: Secondary | ICD-10-CM | POA: Diagnosis not present

## 2022-06-13 DIAGNOSIS — I080 Rheumatic disorders of both mitral and aortic valves: Secondary | ICD-10-CM

## 2022-06-13 DIAGNOSIS — R011 Cardiac murmur, unspecified: Secondary | ICD-10-CM | POA: Insufficient documentation

## 2022-06-13 DIAGNOSIS — R42 Dizziness and giddiness: Secondary | ICD-10-CM | POA: Diagnosis not present

## 2022-06-13 NOTE — Telephone Encounter (Signed)
Dana Dyer looks like you made the referral to hematology for this pt.  Have you heard anything from them? The pt is asking.  Thank you

## 2022-06-15 ENCOUNTER — Telehealth: Payer: Self-pay | Admitting: Internal Medicine

## 2022-06-15 LAB — ECHOCARDIOGRAM COMPLETE
AR max vel: 1.35 cm2
AV Area VTI: 1.56 cm2
AV Area mean vel: 1.49 cm2
AV Mean grad: 19.5 mmHg
AV Peak grad: 38.4 mmHg
Ao pk vel: 3.1 m/s
Area-P 1/2: 2.78 cm2
S' Lateral: 2.2 cm

## 2022-06-15 NOTE — Telephone Encounter (Signed)
Scheduled appt per 6/29 referral. Pt is aware of appt date and time. Pt is aware to arrive 15 mins prior to appt time and to bring and updated insurance card. Pt is aware of appt location.

## 2022-06-20 ENCOUNTER — Telehealth (HOSPITAL_COMMUNITY): Payer: Self-pay | Admitting: Emergency Medicine

## 2022-06-20 NOTE — Telephone Encounter (Signed)
Reaching out to patient to offer assistance regarding upcoming cardiac imaging study; pt verbalizes understanding of appt date/time, parking situation and where to check in, pre-test NPO status and medications ordered, and verified current allergies; name and call back number provided for further questions should they arise Marchia Bond RN Navigator Cardiac Imaging [facility] [phone] office [phone] cell  Holding lasix, spironolactone Holding diabetes meds Aware of nitro Denies iv issues Arrival 830

## 2022-06-21 ENCOUNTER — Telehealth: Payer: Self-pay

## 2022-06-21 MED ORDER — LOSARTAN POTASSIUM 50 MG PO TABS: 50.0000 mg | ORAL_TABLET | Freq: Every day | ORAL | 3 refills | Status: DC

## 2022-06-21 NOTE — Telephone Encounter (Signed)
Spoke with patient who viewed results on MyChart. She understands to cut her current Losartan 13m tablets in half and will pick up new rx at pharmacy for 581mtablets taking one daily. No further questions.

## 2022-06-21 NOTE — Telephone Encounter (Signed)
-----   Message from Thayer Headings, MD sent at 06/17/2022  5:25 PM EDT ----- Normal LV systlic function  Trivial MR Mild - mod aortic stenosis No significant cardiac issues to explain her low BP   Lets reduce Losartan to 50 mg a day ( from 100 mg a day ) Cont to monitor

## 2022-06-22 ENCOUNTER — Ambulatory Visit (HOSPITAL_COMMUNITY)
Admission: RE | Admit: 2022-06-22 | Discharge: 2022-06-22 | Disposition: A | Discharge status: Home / Self Care | Payer: Medicare PPO | Source: Ambulatory Visit | Attending: Cardiovascular Disease | Admitting: Cardiovascular Disease

## 2022-06-22 ENCOUNTER — Encounter (HOSPITAL_COMMUNITY): Payer: Self-pay

## 2022-06-22 DIAGNOSIS — R42 Dizziness and giddiness: Secondary | ICD-10-CM | POA: Insufficient documentation

## 2022-06-22 DIAGNOSIS — R55 Syncope and collapse: Secondary | ICD-10-CM | POA: Diagnosis not present

## 2022-06-22 DIAGNOSIS — R011 Cardiac murmur, unspecified: Secondary | ICD-10-CM | POA: Insufficient documentation

## 2022-06-22 DIAGNOSIS — M79621 Pain in right upper arm: Secondary | ICD-10-CM | POA: Insufficient documentation

## 2022-06-22 DIAGNOSIS — M79622 Pain in left upper arm: Secondary | ICD-10-CM | POA: Insufficient documentation

## 2022-06-22 DIAGNOSIS — R0609 Other forms of dyspnea: Secondary | ICD-10-CM | POA: Insufficient documentation

## 2022-06-22 DIAGNOSIS — R5383 Other fatigue: Secondary | ICD-10-CM | POA: Insufficient documentation

## 2022-06-22 DIAGNOSIS — I10 Essential (primary) hypertension: Secondary | ICD-10-CM | POA: Diagnosis not present

## 2022-06-22 MED ORDER — METOPROLOL TARTRATE 5 MG/5ML IV SOLN
10.0000 mg | INTRAVENOUS | Status: DC | PRN
  Administered 2022-06-22: 10 mg via INTRAVENOUS

## 2022-06-22 MED ORDER — NITROGLYCERIN 0.4 MG SL SUBL: 0.8000 mg | SUBLINGUAL_TABLET | Freq: Once | SUBLINGUAL | Status: DC

## 2022-06-22 MED ORDER — METOPROLOL TARTRATE 5 MG/5ML IV SOLN
INTRAVENOUS | Status: AC
  Filled 2022-06-22: qty 20

## 2022-06-22 NOTE — Progress Notes (Signed)
Patient came in today for CT heart. Patient stated her heart rate is rarely on the lower end and was proud she was in the 80's.  Patient stated that she has been having difficulty with her blood pressure being low as well and stated systolic has been frequently in the 90's as well. Patient's heart rate remains in the 80's after 10 mg of IV lopressor with blood pressure of 109/63.  Called and spoke to Dr Johnsie Cancel would recommend patient being rescheduled with oral lopressor as well as Ivabradine. Notified Clarise Cruz, heart navigator and Legrand Como, [place]. Nothing further needed at this time.

## 2022-06-24 ENCOUNTER — Encounter: Payer: Self-pay | Admitting: Internal Medicine

## 2022-06-24 ENCOUNTER — Ambulatory Visit: Payer: Medicare PPO | Admitting: Internal Medicine

## 2022-06-24 VITALS — BP 126/74 | HR 95 | Ht 63.0 in | Wt 172.0 lb

## 2022-06-24 DIAGNOSIS — Z794 Long term (current) use of insulin: Secondary | ICD-10-CM | POA: Diagnosis not present

## 2022-06-24 DIAGNOSIS — E1142 Type 2 diabetes mellitus with diabetic polyneuropathy: Secondary | ICD-10-CM

## 2022-06-24 DIAGNOSIS — E1165 Type 2 diabetes mellitus with hyperglycemia: Secondary | ICD-10-CM

## 2022-06-24 LAB — POCT GLYCOSYLATED HEMOGLOBIN (HGB A1C): Hemoglobin A1C: 6.8 % — AB (ref 4.0–5.6)

## 2022-06-24 NOTE — Patient Instructions (Addendum)
-   Continue Lantus 30 units daily  - Continue  Humalog 12 units with Breakfast,  16 units Lunch and 18 units supper - Continue Metformin 1000 mg, 1 tablet twice daily  - Humalog correctional insulin: ADD extra units on insulin to your meal-time Humalog  dose if your blood sugars are higher than 155. Use the scale below to help guide you:   Blood sugar before meal Number of units to inject  Less than 155 0 unit  156 - 180 1 units  181 - 205 2 units  206 - 230 3 units  231 - 255 4 units  256 - 280 5 units  281 - 305 6 units  306 - 330 7 units  331 - 355 8 units  356 - 380 9 units   381 - 405 10 units      HOW TO TREAT LOW BLOOD SUGARS (Blood sugar LESS THAN 70 MG/DL) Please follow the RULE OF 15 for the treatment of hypoglycemia treatment (when your (blood sugars are less than 70 mg/dL)   STEP 1: Take 15 grams of carbohydrates when your blood sugar is low, which includes:  3-4 GLUCOSE TABS  OR 3-4 OZ OF JUICE OR REGULAR SODA OR ONE TUBE OF GLUCOSE GEL    STEP 2: RECHECK blood sugar in 15 MINUTES STEP 3: If your blood sugar is still low at the 15 minute recheck --> then, go back to STEP 1 and treat AGAIN with another 15 grams of carbohydrates

## 2022-06-24 NOTE — Progress Notes (Signed)
Name: Dana Dyer  Age/ Sex: 71 y.o., female   MRN/ DOB: [id number], Jan 16, 1951     PCP: Eulas Post, MD   Reason for Endocrinology Evaluation: Type 2 Diabetes Mellitus  Initial Endocrine Consultative Visit: 10/23/18    PATIENT IDENTIFIER: Dana Dyer is a 71 y.o. female with a past medical history of HTN, T2DM, Cirrhosis (Dx 2019), Fibromyalgia and dyslipidemia . The patient has followed with Endocrinology clinic since 10/23/18 for consultative assistance with management of her diabetes.  DIABETIC HISTORY:  Dana Dyer was diagnosed with T2DM in 2005. She used to be on SU but does not recall intolerance. She is intolerant to victoza - nauea and invokana- yeast infection. She was started on insulin therapy in 2009. Her hemoglobin A1c has ranged from 7.8 % in 2012, peaking at 10.9% in 2018.   Ozempic was stopped in 06/2019 due to persistent nausea.   Switched pravastatin to crestor 09/2019  SUBJECTIVE:   During the last visit (12/15/2021): A1c was 7.3%. We adjusted MDI regimen and continue metformin   Today (06/24/2022): Dana Dyer is here for a follow up on diabetes management.  She checks her blood sugars multiple times a day through CGM.  The patient has not had hypoglycemic episodes since the last clinic visit per   Continues to follow up with Gi for cirrhosis  Has heartburn symptoms   She has been following up with cardiology for dizziness and near syncope Follows with sports medicine for left knee pain and chronic back pain   HOME DIABETES REGIMEN:  Lantus 30 units daily Humalog 12 units with Breakfast, 16 units with lunch and 18 units with supper Metformin 1000 mg BID  CF: Humalog (BG-130/25)    CONTINUOUS GLUCOSE MONITORING RECORD INTERPRETATION    Dates of Recording: 7/8-7/21/2023  Sensor description:dexcom  Results statistics:   CGM use % of time 93  Average and SD 159/43  Time in range 73%  % Time Above 180 23  % Time above 250 3   % Time Below target <1      Glycemic patterns summary: Bg's optimal overnight, with hyperglycemia noted during the day   Hyperglycemic episodes  postprandial   Hypoglycemic episodes occurred  n/a  Overnight periods: optimally     DIABETIC COMPLICATIONS: Microvascular complications:  Neuroathy Denies: retinopathy, nephropathy Last eye exam: Completed 09/06/2021     Macrovascular complications:   Denies: CAD, PVD, CVA    HISTORY:  Past Medical History:  Past Medical History:  Diagnosis Date   ALLERGIC RHINITIS 10/09/2007   Allergy    Arthritis    hips, spine   DIABETES MELLITUS, UNCONTROLLED 03/16/2009   FATIGUE 10/21/2008   FIBROMYALGIA 10/09/2007   GERD (gastroesophageal reflux disease)    diet  controlled, no meds   H/O vaginal hysterectomy    HYPERLIPIDEMIA 10/09/2007   diet controlled, no meds   HYPERTENSION 10/09/2007   Morton's neuroma    OBSTRUCTIVE SLEEP APNEA 10/09/2007   Palpitations    Plantar fasciitis    History - right   Sleep apnea    Uses CPAP   TRANSAMINASES, SERUM, ELEVATED 07/08/2009   Past Surgical History:  Past Surgical History:  Procedure Laterality Date   ABDOMINAL HYSTERECTOMY  1979   prolapsed uterus   BASAL CELL CARCINOMA EXCISION     BREAST BIOPSY Left 2012   left 2012 stereo   BREAST REDUCTION SURGERY Bilateral 1992   COLONOSCOPY  2006   [facility]  reduction   REDUCTION MAMMAPLASTY Bilateral [place]   TUBAL LIGATION  1976   Social History:  reports that she has never smoked. She has never used smokeless tobacco. She reports that she does not drink alcohol and does not use drugs. Family History:  Family History  Problem Relation Age of Onset   Diabetes Mother    Arthritis Mother    Cancer Mother        breast   Breast cancer Mother 61   Alcohol abuse Father    Arthritis Father    Hypertension Maternal Grandfather    Miscarriages / Stillbirths Paternal Grandfather    Breast  cancer Maternal Aunt        diagnosed in her 49's   Colon polyps Neg Hx    Colon cancer Neg Hx    Esophageal cancer Neg Hx    Rectal cancer Neg Hx    Stomach cancer Neg Hx      HOME MEDICATIONS: Allergies as of 06/24/2022       Reactions   Amoxicillin-pot Clavulanate    Severe diarrhea   Codeine Sulfate    REACTION: "hyper"   Diflucan [fluconazole] Hives   Erythromycin Base    Invokana [canagliflozin] Other (See Comments)   Recurrent yeast infections   Irbesartan-hydrochlorothiazide    Latex    Victoza [liraglutide] Nausea Only   Ozempic (0.25 Or 0.5 Mg-dose) [semaglutide(0.25 Or 0.31m-dos)] Nausea Only        Medication List        Accurate as of June 24, 2022  1:41 PM. If you have any questions, ask your nurse or doctor.          STOP taking these medications    metoprolol tartrate 100 MG tablet Commonly known as: LOPRESSOR Stopped by: IDorita Sciara MD   omeprazole 20 MG capsule Commonly known as: PRILOSEC Stopped by: IDorita Sciara MD       TAKE these medications    BD Pen Needle Nano 2nd Gen 32G X 4 MM Misc Generic drug: Insulin Pen Needle USE AS DIRECTED   Dexcom G7 Sensor Misc 1 Device by Does not apply route as directed. Every 10 days   escitalopram 10 MG tablet Commonly known as: LEXAPRO TAKE 1 TABLET BY MOUTH EVERY DAY   famotidine 20 MG tablet Commonly known as: PEPCID Take 20 mg by mouth at bedtime.   furosemide 20 MG tablet Commonly known as: LASIX TAKE 1 TABLET BY MOUTH EVERY DAY What changed: how much to take   HumaLOG KwikPen 200 UNIT/ML KwikPen Generic drug: insulin lispro Max Daily 120   Klor-Con M20 20 MEQ tablet Generic drug: potassium chloride SA TAKE 1 TABLET BY MOUTH EVERY DAY   lactulose 10 GM/15ML solution Commonly known as: CHRONULAC TAKE 6 TABLESPOONS DAILY What changed: additional instructions   Lantus SoloStar 100 UNIT/ML Solostar Pen Generic drug: insulin glargine Inject 30 Units into  the skin daily.   losartan 50 MG tablet Commonly known as: COZAAR Take 1 tablet (50 mg total) by mouth daily.   metFORMIN 1000 MG tablet Commonly known as: GLUCOPHAGE TAKE 1 TABLET BY MOUTH TWICE A DAY   spironolactone 50 MG tablet Commonly known as: Aldactone Take 1 tablet (50 mg total) by mouth every other day.         OBJECTIVE:   Vital Signs: BP 126/74 (BP Location: Left Arm, Patient Position: Sitting, Cuff Size: Large)   Pulse 95   Ht 5' 3"  (1.6  m)   Wt 172 lb (78 kg)   SpO2 96%   BMI 30.47 kg/m   Wt Readings from Last 3 Encounters:  06/24/22 172 lb (78 kg)  05/31/22 172 lb (78 kg)  05/27/22 175 lb (79.4 kg)     Exam: General: Dana Dyer appears well and is in NAD  Lungs: Clear with good BS bilat with no rales, rhonchi, or wheezes  Heart: RRR   Abdomen: Normoactive bowel sounds, soft, nontender, without masses or organomegaly palpable  Extremities: No  pretibial edema.  Neuro: MS is good with appropriate affect, Dana Dyer is alert and Ox3   DM Foot 12/15/2021 The skin of the feet is intact without sores or ulcerations. The pedal pulses are 2+ on right and 2+ on left. The sensation is intact   to a screening 5.07, 10 gram monofilament on the left      DATA REVIEWED:  Lab Results  Component Value Date   HGBA1C 6.8 (A) 06/24/2022   HGBA1C 6.7 (A) 03/17/2022   HGBA1C 7.3 (A) 12/15/2021    Latest Reference Range & Units 05/27/22 10:19  Sodium 135 - 145 mEq/L 130 (L)  Potassium 3.5 - 5.1 mEq/L 4.5  Chloride 96 - 112 mEq/L 97  CO2 19 - 32 mEq/L 23  Glucose 70 - 99 mg/dL 134 (H)  BUN 6 - 23 mg/dL 17  Creatinine 0.40 - 1.20 mg/dL 0.83  Calcium 8.4 - 10.5 mg/dL 9.7  Alkaline Phosphatase 39 - 117 U/L 89  Albumin 3.5 - 5.2 g/dL 3.7  AST 0 - 37 U/L 43 (H)  ALT 0 - 35 U/L 42 (H)  Total Protein 6.0 - 8.3 g/dL 7.0  Total Bilirubin 0.2 - 1.2 mg/dL 1.7 (H)  GFR >60.00 mL/min 70.26     ASSESSMENT / PLAN / RECOMMENDATIONS:   1) Type 2 Diabetes Mellitus, OPtimally  controlled, With neuropathic complications - Most recent A1c of 6.8 %. Goal A1c < 7.5 %.   -A1c remains at goal -She has been using less insulin than previously prescribed -No changes at this time but I have encouraged her to use correction scale if needed before each meal - She is intolerant to GLP-1 agonists due to GI side effects and SGLT-2 inhibitors due to recurrent yeast infection     MEDICATIONS: -Continue Lantus 30 units daily -Continue Humalog 12 units with breakfast, 16 units with Lunch and 18 units with supper  -Continue metformin 1000 mg BID  - CF : HUmalog ( BG -130/25)    EDUCATION / INSTRUCTIONS: BG monitoring instructions: Patient is instructed to check her blood sugars 4 times a day, before meals and bedtime. Call [place] [facility] if: BG persistently < 70  I reviewed the Rule of 15 for the treatment of hypoglycemia in detail with the patient. Literature supplied.      F/U in 4 months  Signed electronically by: Mack Guise, MD  [facility] End[facility]  [facility] [address] [place], [place] [address] Phone: [phone] FAX: [phone]   CC: Eulas Post, [place] [address] Phone: [phone]  Fax: [phone]  Return to Endocrinology clinic as below: Future Appointments  Date Time Provider [place]  06/28/2022 11:15 AM [facility] None  06/28/2022 11:45 AM Curt Bears, MD [facility] None  07/25/2022  8:00 AM Eulas Post, MD [facility]  08/18/2022  1:20 PM Nahser, Wonda Cheng, MD [facility]

## 2022-06-27 ENCOUNTER — Other Ambulatory Visit: Payer: Self-pay | Admitting: Internal Medicine

## 2022-06-27 DIAGNOSIS — D509 Iron deficiency anemia, unspecified: Secondary | ICD-10-CM

## 2022-06-28 ENCOUNTER — Inpatient Hospital Stay: Payer: Medicare PPO

## 2022-06-28 ENCOUNTER — Other Ambulatory Visit: Payer: Self-pay | Admitting: Internal Medicine

## 2022-06-28 ENCOUNTER — Other Ambulatory Visit: Payer: Self-pay

## 2022-06-28 ENCOUNTER — Inpatient Hospital Stay: Payer: Medicare PPO | Attending: Internal Medicine | Admitting: Internal Medicine

## 2022-06-28 ENCOUNTER — Encounter: Payer: Self-pay | Admitting: Internal Medicine

## 2022-06-28 VITALS — BP 147/66 | HR 102 | Temp 97.9°F | Resp 16 | Wt 172.8 lb

## 2022-06-28 DIAGNOSIS — E785 Hyperlipidemia, unspecified: Secondary | ICD-10-CM | POA: Insufficient documentation

## 2022-06-28 DIAGNOSIS — M797 Fibromyalgia: Secondary | ICD-10-CM | POA: Insufficient documentation

## 2022-06-28 DIAGNOSIS — I10 Essential (primary) hypertension: Secondary | ICD-10-CM | POA: Diagnosis not present

## 2022-06-28 DIAGNOSIS — E119 Type 2 diabetes mellitus without complications: Secondary | ICD-10-CM | POA: Diagnosis not present

## 2022-06-28 DIAGNOSIS — D509 Iron deficiency anemia, unspecified: Secondary | ICD-10-CM | POA: Diagnosis not present

## 2022-06-28 DIAGNOSIS — Z90710 Acquired absence of both cervix and uterus: Secondary | ICD-10-CM | POA: Diagnosis not present

## 2022-06-28 DIAGNOSIS — Z803 Family history of malignant neoplasm of breast: Secondary | ICD-10-CM | POA: Diagnosis not present

## 2022-06-28 DIAGNOSIS — D500 Iron deficiency anemia secondary to blood loss (chronic): Secondary | ICD-10-CM

## 2022-06-28 DIAGNOSIS — D693 Immune thrombocytopenic purpura: Secondary | ICD-10-CM | POA: Diagnosis not present

## 2022-06-28 DIAGNOSIS — Z862 Personal history of diseases of the blood and blood-forming organs and certain disorders involving the immune mechanism: Secondary | ICD-10-CM

## 2022-06-28 DIAGNOSIS — K219 Gastro-esophageal reflux disease without esophagitis: Secondary | ICD-10-CM | POA: Insufficient documentation

## 2022-06-28 LAB — CBC WITH DIFFERENTIAL (CANCER CENTER ONLY)
Abs Immature Granulocytes: 0.02 10*3/uL (ref 0.00–0.07)
Basophils Absolute: 0 10*3/uL (ref 0.0–0.1)
Basophils Relative: 1 %
Eosinophils Absolute: 0.2 10*3/uL (ref 0.0–0.5)
Eosinophils Relative: 3 %
HCT: 27.7 % — ABNORMAL LOW (ref 36.0–46.0)
Hemoglobin: 9.2 g/dL — ABNORMAL LOW (ref 12.0–15.0)
Immature Granulocytes: 0 %
Lymphocytes Relative: 19 %
Lymphs Abs: 1 10*3/uL (ref 0.7–4.0)
MCH: 25.6 pg — ABNORMAL LOW (ref 26.0–34.0)
MCHC: 33.2 g/dL (ref 30.0–36.0)
MCV: 76.9 fL — ABNORMAL LOW (ref 80.0–100.0)
Monocytes Absolute: 0.7 10*3/uL (ref 0.1–1.0)
Monocytes Relative: 14 %
Neutro Abs: 3.3 10*3/uL (ref 1.7–7.7)
Neutrophils Relative %: 63 %
Platelet Count: 123 10*3/uL — ABNORMAL LOW (ref 150–400)
RBC: 3.6 MIL/uL — ABNORMAL LOW (ref 3.87–5.11)
RDW: 14.5 % (ref 11.5–15.5)
WBC Count: 5.1 10*3/uL (ref 4.0–10.5)
nRBC: 0 % (ref 0.0–0.2)

## 2022-06-28 LAB — CMP (CANCER CENTER ONLY)
ALT: 44 U/L (ref 0–44)
AST: 41 U/L (ref 15–41)
Albumin: 3.7 g/dL (ref 3.5–5.0)
Alkaline Phosphatase: 99 U/L (ref 38–126)
Anion gap: 7 (ref 5–15)
BUN: 20 mg/dL (ref 8–23)
CO2: 25 mmol/L (ref 22–32)
Calcium: 9.4 mg/dL (ref 8.9–10.3)
Chloride: 94 mmol/L — ABNORMAL LOW (ref 98–111)
Creatinine: 0.87 mg/dL (ref 0.44–1.00)
GFR, Estimated: >60 mL/min (ref >60)
Glucose, Bld: 188 mg/dL — ABNORMAL HIGH (ref 70–99)
Potassium: 4.5 mmol/L (ref 3.5–5.1)
Sodium: 126 mmol/L — ABNORMAL LOW (ref 135–145)
Total Bilirubin: 1.7 mg/dL — ABNORMAL HIGH (ref 0.3–1.2)
Total Protein: 6.9 g/dL (ref 6.5–8.1)

## 2022-06-28 LAB — IRON AND IRON BINDING CAPACITY (CC-WL,HP ONLY)
Iron: 37 ug/dL (ref 28–170)
Saturation Ratios: 7 % — ABNORMAL LOW (ref 10.4–31.8)
TIBC: 543 ug/dL — ABNORMAL HIGH (ref 250–450)
UIBC: 506 ug/dL — ABNORMAL HIGH (ref 148–442)

## 2022-06-28 LAB — FERRITIN: Ferritin: 7 ng/mL — ABNORMAL LOW (ref 11–307)

## 2022-06-28 LAB — TSH: TSH: 2.736 u[IU]/mL (ref 0.350–4.500)

## 2022-06-28 NOTE — Progress Notes (Signed)
[facility] [phone]   [phone]  CONSULT NOTE  REFERRING PHYSICIAN: Dr. Owens Loffler  REASON FOR CONSULTATION:  71 years old white female with iron deficiency anemia.  HPI Dana Dyer is a 71 y.o. female with past medical history significant for diabetes mellitus, history of fibromyalgia, hypertension, dyslipidemia, sleep apnea, GERD and allergy.  The patient also has a history of ITP and I saw her in August 2019 for this problem and she has been doing fine since that time.  She was seen recently by her gastroenterologist for evaluation of persistent anemia.  She has a history of diverticulosis and colon ulcer.  She had a colonoscopy on 03/30/2022 and it showed 2 polyps in the descending colon and ascending colon with 1 cm ulcer in the cecum with irregular adjacent mucosa.  She also had upper endoscopy at that time that showed small less than 5 mm varices found in the lower third of the esophagus without signs of rectal bleeding.  There was a changes of very mild portal gastroscopy noted in the proximal stomach and mild inflammation in the gastric antrum.  The patient was giving a prescription for oral iron tablet but she has intolerance to this treatment with significant constipation and other gastrointestinal issues.  She was referred by Dr. Ardis Hughs to me today for evaluation and consideration of IV iron infusion. The patient has been doing fine with no concerning complaints except for shortness of breath with exertion and hypotension followed by Dr. Acie Fredrickson.  She has no current chest pain, cough or hemoptysis.  She has no nausea, vomiting, diarrhea but has intermittent constipation with no abdominal pain.  She has no recent weight loss or night sweats.  She has no headache or visual changes. Family history significant for mother who had breast cancer but died at age 45.  Father had pulmonary fibrosis. The patient is married and has 2 children a daughter  and son.  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  She has no history for smoking, alcohol or drug abuse.  HPI  Past Medical History:  Diagnosis Date   ALLERGIC RHINITIS 10/09/2007   Allergy    Arthritis    hips, spine   DIABETES MELLITUS, UNCONTROLLED 03/16/2009   FATIGUE 10/21/2008   FIBROMYALGIA 10/09/2007   GERD (gastroesophageal reflux disease)    diet  controlled, no meds   H/O vaginal hysterectomy    HYPERLIPIDEMIA 10/09/2007   diet controlled, no meds   HYPERTENSION 10/09/2007   Morton's neuroma    OBSTRUCTIVE SLEEP APNEA 10/09/2007   Palpitations    Plantar fasciitis    History - right   Sleep apnea    Uses CPAP   TRANSAMINASES, SERUM, ELEVATED 07/08/2009    Past Surgical History:  Procedure Laterality Date   ABDOMINAL HYSTERECTOMY  1979   prolapsed uterus   BASAL CELL CARCINOMA EXCISION     BREAST BIOPSY Left 2012   left 2012 stereo   BREAST REDUCTION SURGERY Bilateral 1992   COLONOSCOPY  2006   [facility]   mamoplasty     reduction   REDUCTION MAMMAPLASTY Bilateral 1992   TONSILLECTOMY  1959   TUBAL LIGATION  1976    Family History  Problem Relation Age of Onset   Diabetes Mother    Arthritis Mother    Cancer Mother        breast   Breast cancer Mother 1   Alcohol abuse Father  Arthritis Father    Hypertension Maternal Grandfather    Miscarriages / Stillbirths Paternal Grandfather    Breast cancer Maternal Aunt        diagnosed in her 67's   Colon polyps Neg Hx    Colon cancer Neg Hx    Esophageal cancer Neg Hx    Rectal cancer Neg Hx    Stomach cancer Neg Hx     Social History Social History   Tobacco Use   Smoking status: Never   Smokeless tobacco: Never  Vaping Use   Vaping Use: Never used  Substance Use Topics   Alcohol use: No    Alcohol/week: 0.0 standard drinks of alcohol   Drug use: No    Allergies  Allergen Reactions   Amoxicillin-Pot Clavulanate     Severe diarrhea   Codeine Sulfate      REACTION: "hyper"   Diflucan [Fluconazole] Hives   Erythromycin Base    Invokana [Canagliflozin] Other (See Comments)    Recurrent yeast infections   Irbesartan-Hydrochlorothiazide    Latex    Victoza [Liraglutide] Nausea Only   Ozempic (0.25 Or 0.5 Mg-Dose) [Semaglutide(0.25 Or 0.37m-Dos)] Nausea Only    Current Outpatient Medications  Medication Sig Dispense Refill   BD PEN NEEDLE NANO 2ND GEN 32G X 4 MM MISC USE AS DIRECTED 400 each 3   Continuous Blood Gluc Sensor (DEXCOM G7 SENSOR) MISC 1 Device by Does not apply route as directed. Every 10 days 9 each 3   escitalopram (LEXAPRO) 10 MG tablet TAKE 1 TABLET BY MOUTH EVERY DAY 90 tablet 0   famotidine (PEPCID) 20 MG tablet Take 20 mg by mouth at bedtime.     furosemide (LASIX) 20 MG tablet TAKE 1 TABLET BY MOUTH EVERY DAY (Patient taking differently: Take 10 mg by mouth daily.) 90 tablet 3   insulin glargine (LANTUS SOLOSTAR) 100 UNIT/ML Solostar Pen Inject 30 Units into the skin daily. 45 mL 3   insulin lispro (HUMALOG KWIKPEN) 200 UNIT/ML KwikPen Max Daily 120 30 mL 3   KLOR-CON M20 20 MEQ tablet TAKE 1 TABLET BY MOUTH EVERY DAY 90 tablet 1   lactulose (CHRONULAC) 10 GM/15ML solution TAKE 6 TABLESPOONS DAILY (Patient taking differently: TAKE 3 TABLESPOONS DAILY) 2430 mL 1   losartan (COZAAR) 50 MG tablet Take 1 tablet (50 mg total) by mouth daily. 90 tablet 3   metFORMIN (GLUCOPHAGE) 1000 MG tablet TAKE 1 TABLET BY MOUTH TWICE A DAY 180 tablet 3   spironolactone (ALDACTONE) 50 MG tablet Take 1 tablet (50 mg total) by mouth every other day. 30 tablet 11   Current Facility-Administered Medications  Medication Dose Route Frequency Provider Last Rate Last Admin   TDaP (BOOSTRIX) injection 0.5 mL  0.5 mL Intramuscular Once Burchette, BAlinda Sierras MD        Review of Systems  Constitutional: positive for fatigue Eyes: negative Ears, nose, mouth, throat, and face: negative Respiratory: positive for dyspnea on exertion Cardiovascular:  negative Gastrointestinal: negative Genitourinary:negative Integument/breast: negative Hematologic/lymphatic: negative Musculoskeletal:negative Neurological: negative Behavioral/Psych: negative Endocrine: negative Allergic/Immunologic: negative  Physical Exam  RZOX:WRUEA healthy, no distress, well nourished, and well developed SKIN: skin color, texture, turgor are normal, no rashes or significant lesions HEAD: Normocephalic, No masses, lesions, tenderness or abnormalities EYES: normal, PERRLA, Conjunctiva are pink and non-injected EARS: External ears normal, Canals clear OROPHARYNX:no exudate, no erythema, and lips, buccal mucosa, and tongue normal  NECK: supple, no adenopathy, no JVD LYMPH:  no palpable lymphadenopathy, no hepatosplenomegaly BREAST:not examined LUNGS: clear  to auscultation , and palpation HEART: regular rate & rhythm, no murmurs, and no gallops ABDOMEN:abdomen soft, non-tender, normal bowel sounds, and no masses or organomegaly BACK: Back symmetric, no curvature., No CVA tenderness EXTREMITIES:no joint deformities, effusion, or inflammation, no edema  NEURO: alert & oriented x 3 with fluent speech, no focal motor/sensory deficits  PERFORMANCE STATUS: ECOG 1  LABORATORY DATA: Lab Results  Component Value Date   WBC 5.1 06/28/2022   HGB 9.2 (L) 06/28/2022   HCT 27.7 (L) 06/28/2022   MCV 76.9 (L) 06/28/2022   PLT 123 (L) 06/28/2022      Chemistry      Component Value Date/Time   NA 130 (L) 05/27/2022 1019   K 4.5 05/27/2022 1019   CL 97 05/27/2022 1019   CO2 23 05/27/2022 1019   BUN 17 05/27/2022 1019   CREATININE 0.83 05/27/2022 1019      Component Value Date/Time   CALCIUM 9.7 05/27/2022 1019   ALKPHOS 89 05/27/2022 1019   AST 43 (H) 05/27/2022 1019   ALT 42 (H) 05/27/2022 1019   BILITOT 1.7 (H) 05/27/2022 1019       RADIOGRAPHIC STUDIES: ECHOCARDIOGRAM COMPLETE  Result Date: 06/15/2022    ECHOCARDIOGRAM REPORT   Patient Name:   Dana Dyer Date of Exam: 06/13/2022 Medical Rec #:  [id number]       Height:       63.0 in Accession #:    [id number]      Weight:       172.0 lb Date of Birth:  [birth year]       BSA:          1.814 m Patient Age:    42 years        BP:           118/60 mmHg Patient Gender: F               HR:           96 bpm. Exam Location:  [address] Procedure: 2D Echo, Cardiac Doppler, Color Doppler, 3D Echo and Strain Analysis Indications:    R01.1 Murmur  History:        Patient has prior history of Echocardiogram examinations, most                 recent 02/12/2020. Signs/Symptoms:Murmur and Shortness of Breath;                 Risk Factors:Hypertension, Diabetes, Dyslipidemia and Obesity.  Sonographer:    Coralyn Helling RDCS Referring Phys: Charlo  1. Left ventricular ejection fraction, by estimation, is 60 to 65%. The left ventricle has normal function. The left ventricle has no regional wall motion abnormalities. Left ventricular diastolic parameters are indeterminate.  2. Right ventricular systolic function is normal. The right ventricular size is normal. There is normal pulmonary artery systolic pressure.  3. Left atrial size was mild to moderately dilated.  4. The mitral valve is grossly normal. Trivial mitral valve regurgitation. No evidence of mitral stenosis.  5. The aortic valve has an indeterminant number of cusps. There is mild calcification of the aortic valve. There is mild thickening of the aortic valve. Aortic valve regurgitation is not visualized. Mild to moderate aortic valve stenosis.  6. The inferior vena cava is normal in size with greater than 50% respiratory variability, suggesting right atrial pressure of 3 mmHg. Comparison(s): Changes from prior study are noted. Prior AoV mean gradient 12 mmHg (  mild), current 19.5 (on border of mild-moderate). Conclusion(s)/Recommendation(s): Otherwise normal echocardiogram, with minor abnormalities described in the report. FINDINGS  Left  Ventricle: Left ventricular ejection fraction, by estimation, is 60 to 65%. The left ventricle has normal function. The left ventricle has no regional wall motion abnormalities. The left ventricular internal cavity size was normal in size. There is  borderline left ventricular hypertrophy. Left ventricular diastolic parameters are indeterminate. Normal left ventricular filling pressure. Right Ventricle: The right ventricular size is normal. No increase in right ventricular wall thickness. Right ventricular systolic function is normal. There is normal pulmonary artery systolic pressure. The tricuspid regurgitant velocity is 2.39 m/s, and  with an assumed right atrial pressure of 3 mmHg, the estimated right ventricular systolic pressure is 52.7 mmHg. Left Atrium: Left atrial size was mild to moderately dilated. Right Atrium: Right atrial size was normal in size. Pericardium: There is no evidence of pericardial effusion. Mitral Valve: The mitral valve is grossly normal. There is mild thickening of the mitral valve leaflet(s). There is mild calcification of the mitral valve leaflet(s). Mild mitral annular calcification. Trivial mitral valve regurgitation. No evidence of mitral valve stenosis. Tricuspid Valve: The tricuspid valve is normal in structure. Tricuspid valve regurgitation is trivial. No evidence of tricuspid stenosis. Aortic Valve: The aortic valve has an indeterminant number of cusps. There is mild calcification of the aortic valve. There is mild thickening of the aortic valve. Aortic valve regurgitation is not visualized. Mild to moderate aortic stenosis is present.  Aortic valve mean gradient measures 19.5 mmHg. Aortic valve peak gradient measures 38.4 mmHg. Aortic valve area, by VTI measures 1.56 cm. Pulmonic Valve: The pulmonic valve was not well visualized. Pulmonic valve regurgitation is not visualized. No evidence of pulmonic stenosis. Aorta: The aortic root, ascending aorta, aortic arch and  descending aorta are all structurally normal, with no evidence of dilitation or obstruction. Venous: The inferior vena cava is normal in size with greater than 50% respiratory variability, suggesting right atrial pressure of 3 mmHg. IAS/Shunts: The atrial septum is grossly normal.  LEFT VENTRICLE PLAX 2D LVIDd:         3.50 cm   Diastology LVIDs:         2.20 cm   LV e' medial:    8.70 cm/s LV PW:         1.20 cm   LV E/e' medial:  11.1 LV IVS:        1.20 cm   LV e' lateral:   8.49 cm/s LVOT diam:     2.00 cm   LV E/e' lateral: 11.4 LV SV:         90 LV SV Index:   50 LVOT Area:     3.14 cm                           3D Volume EF:                          3D EF:        65 %                          LV EDV:       119 ml                          LV ESV:  42 ml                          LV SV:        77 ml RIGHT VENTRICLE             IVC RV S prime:     27.40 cm/s  IVC diam: 1.40 cm TAPSE (M-mode): 2.7 cm RVSP:           25.8 mmHg LEFT ATRIUM             Index        RIGHT ATRIUM           Index LA diam:        4.70 cm 2.59 cm/m   RA Pressure: 3.00 mmHg LA Vol (A2C):   56.9 ml 31.37 ml/m  RA Area:     9.86 cm LA Vol (A4C):   69.7 ml 38.43 ml/m  RA Volume:   19.60 ml  10.81 ml/m LA Biplane Vol: 66.9 ml 36.89 ml/m  AORTIC VALVE AV Area (Vmax):    1.35 cm AV Area (Vmean):   1.49 cm AV Area (VTI):     1.56 cm AV Vmax:           310.00 cm/s AV Vmean:          205.500 cm/s AV VTI:            0.576 m AV Peak Grad:      38.4 mmHg AV Mean Grad:      19.5 mmHg LVOT Vmax:         133.00 cm/s LVOT Vmean:        97.500 cm/s LVOT VTI:          0.286 m LVOT/AV VTI ratio: 0.50  AORTA Ao Root diam: 2.70 cm Ao Asc diam:  2.70 cm MITRAL VALVE                TRICUSPID VALVE MV Area (PHT): 2.78 cm     TR Peak grad:   22.8 mmHg MV Decel Time: 273 msec     TR Vmax:        239.00 cm/s MV E velocity: 96.90 cm/s   Estimated RAP:  3.00 mmHg MV A velocity: 117.00 cm/s  RVSP:           25.8 mmHg MV E/A ratio:  0.83                              SHUNTS                             Systemic VTI:  0.29 m                             Systemic Diam: 2.00 cm Buford Dresser MD Electronically signed by Buford Dresser MD Signature Date/Time: 06/15/2022/8:03:45 AM    Final    US Abdomen Limited RUQ (LIVER/GB)  Result Date: 06/09/2022 CLINICAL DATA:  Cirrhosis EXAM: ULTRASOUND ABDOMEN LIMITED RIGHT UPPER QUADRANT COMPARISON:  12/09/2021 FINDINGS: Gallbladder: 3 mm gallbladder fundal polyp. No wall thickening or pericholecystic fluid. Sonographic Murphy's sign was not elicited. Common bile duct: Diameter: Normal, 4 mm Liver: Coarsened hepatic echotexture with irregular hepatic capsule. No focal liver lesion. Portal vein is patent on color Doppler imaging with  normal direction of blood flow towards the liver. Other: Trace perihepatic ascites. IMPRESSION: Cirrhosis without evidence of hepatocellular carcinoma. trace perihepatic ascites. 3 mm gallbladder polyp. Per consensus criteria, this can be presumed benign and imaging follow-up is not indicated. This recommendation follows ACR consensus guidelines: White Paper of the ACR Incidental Findings Committee II on Gallbladder and Biliary Findings. J Am Coll Radiol 2013:;10:953-956. Electronically Signed   By: Abigail Miyamoto M.D.   On: 06/09/2022 17:01    ASSESSMENT: This is a very pleasant 71 years old white female with history of iron deficiency anemia likely secondary to gastrointestinal blood loss. The patient has intolerance to the oral iron tablets. She also has a history of ITP.  PLAN: I had a lengthy discussion with the patient today about her current condition and treatment options. I ordered several studies today for evaluation of her anemia including repeat CBC that showed hemoglobin of 9.2 and hematocrit 27.7% with MCV of 76.9.  Her platelets count were 123,000.  Iron studies showed low normal serum iron of 37 with iron saturation of 7% and elevated TIBC of 543.  Ferritin level still  pending.  I also order blood for heavy metals as well as serum protein electrophoresis and TSH. I recommended for the patient to proceed with iron infusion with Venofer 300 Mg IV weekly for 3 weeks starting on 07/01/2022. I will see the patient back for follow-up visit in 3 months for evaluation and repeat CBC, iron study and ferritin. She was advised to call immediately if she has any other concerning symptoms in the interval.  The patient voices understanding of current disease status and treatment options and is in agreement with the current care plan.  All questions were answered. The patient knows to call the clinic with any problems, questions or concerns. We can certainly see the patient much sooner if necessary.  Thank you so much for allowing me to participate in the care of Dana Dyer. I will continue to follow up the patient with you and assist in her care.  The total time spent in the appointment was 60 minutes.  Disclaimer: This note was dictated with voice recognition software. Similar sounding words can inadvertently be transcribed and may not be corrected upon review.   Eilleen Kempf June 28, 2022, 11:56 AM

## 2022-06-30 ENCOUNTER — Telehealth: Payer: Self-pay | Admitting: Cardiovascular Disease

## 2022-06-30 LAB — HEAVY METALS, BLOOD
Arsenic: 2 ug/L (ref 0–9)
Lead: <1 ug/dL (ref 0.0–3.4)
Mercury: <1 ug/L (ref 0.0–14.9)

## 2022-06-30 NOTE — Telephone Encounter (Signed)
Patient states she's only taking the Losartan every other day. She hasn't been able to see in a difference in her BP at all since stopping Amlodipine. She has dropped off readings in office which show [id number]'H systolic over 68-08 diastolic. (Will have readings scanned to chart). Patient states that she feels like she is a shut-in now d/t "feeling completely zapped with very little to no amount of activity." States she is becoming afraid to leave home, because she's scared something will happen while out, or she won't be able to make it back home. Advised patient to hold Losartan until Monday when I can speak with Dr Acie Fredrickson. She clearly doesn't need the BP lowering medications and cannot increase fluid d/t 24oz daily restriction for her cirrhosis. Will route to MD for review and follow up with patient next week. She will keep a log until that time and can let us know what her BP has been running without the medicine.

## 2022-06-30 NOTE — Telephone Encounter (Signed)
She dropped off some BP readings by the office.  She wants to make sure they were received.  She states they were on the low side.  She wants to know what Dr. Elmarie Shiley thoughts were about her BP readings.

## 2022-07-04 LAB — PROTEIN ELECTROPHORESIS, SERUM, WITH REFLEX
A/G Ratio: 1 (ref 0.7–1.7)
Albumin ELP: 3.2 g/dL (ref 2.9–4.4)
Alpha-1-Globulin: 0.3 g/dL (ref 0.0–0.4)
Alpha-2-Globulin: 0.7 g/dL (ref 0.4–1.0)
Beta Globulin: 1 g/dL (ref 0.7–1.3)
Gamma Globulin: 1.3 g/dL (ref 0.4–1.8)
Globulin, Total: 3.2 g/dL (ref 2.2–3.9)
Total Protein ELP: 6.4 g/dL (ref 6.0–8.5)

## 2022-07-04 NOTE — Addendum Note (Signed)
Addended by: Ma Hillock on: 07/04/2022 12:44 PM   Modules accepted: Orders

## 2022-07-04 NOTE — Telephone Encounter (Signed)
Reached out to patient who states without the Losartan she has felt much better. Her pressure for the last 2 days has been 443 systolic. At time of call she checks and provides 127/68. Advised her to stop Losartan going forward per Nahser. She will continue to monitor BP and alert [place] if she begins to trend (and maintain) greater than 130/80.   Nahser, Wonda Cheng, MD  You Yesterday (7:24 AM)    OK to hold Losartan for now also   She is having significant hypotension   PN

## 2022-07-05 ENCOUNTER — Telehealth: Payer: Self-pay

## 2022-07-05 NOTE — Telephone Encounter (Signed)
Inbound call from patient returning call. Patient scheduled for OV 09/06/22 AT 9:50 AM. Please give a call if needing to further advise.  Thank you for all that you do.

## 2022-07-05 NOTE — Telephone Encounter (Signed)
Left message for patient to return call to schedule 3 month follow up appointment with Dr Ardis Hughs in September.  Will continue efforts.

## 2022-07-05 NOTE — Telephone Encounter (Signed)
-----   Message from [place] sent at 05/27/2022 10:11 AM EDT ----- Regarding: September 2023 3 month follow up, DJ, dx cirrhosis

## 2022-07-06 ENCOUNTER — Other Ambulatory Visit: Payer: Self-pay | Admitting: Physician Assistant

## 2022-07-06 ENCOUNTER — Telehealth: Payer: Self-pay | Admitting: Pharmacy Technician

## 2022-07-06 ENCOUNTER — Telehealth: Payer: Self-pay

## 2022-07-06 DIAGNOSIS — D509 Iron deficiency anemia, unspecified: Secondary | ICD-10-CM

## 2022-07-06 NOTE — Telephone Encounter (Addendum)
Dr. Toya Smothers, Juluis Rainier note:  Auth Submission: no auth needed Payer: [employer] Medication & CPT/J Code(s) submitted: Venofer (Iron Sucrose) J1756 Route of submission (phone, fax, portal): phone Auth type: Buy/Bill Units/visits requested:  Reference number: x3 doses Approval from: 07/06/22 to 10/06/22   Patient will be scheduled as soon as possible.

## 2022-07-06 NOTE — Telephone Encounter (Signed)
Pt called upset that she had not been scheduled for her iron infusion. After review, it was determined the order had some error. Cassandra, PA-C has re-entered the order. I have also LVM for the [employer] to alert them of the order and the pts concern.

## 2022-07-07 ENCOUNTER — Ambulatory Visit (INDEPENDENT_AMBULATORY_CARE_PROVIDER_SITE_OTHER): Payer: Medicare PPO

## 2022-07-07 VITALS — BP 119/67 | HR 89 | Temp 98.2°F | Resp 16 | Ht 63.0 in | Wt 170.0 lb

## 2022-07-07 DIAGNOSIS — K922 Gastrointestinal hemorrhage, unspecified: Secondary | ICD-10-CM | POA: Diagnosis not present

## 2022-07-07 DIAGNOSIS — D500 Iron deficiency anemia secondary to blood loss (chronic): Secondary | ICD-10-CM | POA: Diagnosis not present

## 2022-07-07 DIAGNOSIS — D509 Iron deficiency anemia, unspecified: Secondary | ICD-10-CM

## 2022-07-07 MED ORDER — SODIUM CHLORIDE 0.9 % IV SOLN
300.0000 mg | Freq: Once | INTRAVENOUS | Status: AC
  Administered 2022-07-07: 300 mg via INTRAVENOUS
  Filled 2022-07-07: qty 15

## 2022-07-07 MED ORDER — ACETAMINOPHEN 325 MG PO TABS
650.0000 mg | ORAL_TABLET | Freq: Once | ORAL | Status: AC
  Administered 2022-07-07: 650 mg via ORAL
  Filled 2022-07-07: qty 2

## 2022-07-07 MED ORDER — DIPHENHYDRAMINE HCL 25 MG PO CAPS
25.0000 mg | ORAL_CAPSULE | Freq: Once | ORAL | Status: AC
  Administered 2022-07-07: 25 mg via ORAL
  Filled 2022-07-07: qty 1

## 2022-07-07 NOTE — Progress Notes (Signed)
Diagnosis: Iron Deficiency Anemia  Provider:  Marshell Garfinkel, MD  Procedure: Infusion  IV Type: Peripheral, IV Location: L Antecubital  Venofer (Iron Sucrose), Dose: 300 mg  Infusion Start Time: 0063  Infusion Stop Time: 4949  Post Infusion IV Care: Observation period completed  Discharge: Condition: Good, Destination: Home . AVS provided to patient.   Performed by:  Koren Shiver, RN

## 2022-07-12 ENCOUNTER — Other Ambulatory Visit: Payer: Self-pay | Admitting: Sports Medicine

## 2022-07-13 ENCOUNTER — Telehealth (HOSPITAL_COMMUNITY): Payer: Self-pay | Admitting: Emergency Medicine

## 2022-07-13 DIAGNOSIS — R011 Cardiac murmur, unspecified: Secondary | ICD-10-CM

## 2022-07-13 DIAGNOSIS — R079 Chest pain, unspecified: Secondary | ICD-10-CM

## 2022-07-13 MED ORDER — IVABRADINE HCL 5 MG PO TABS: 10.0000 mg | ORAL_TABLET | Freq: Once | ORAL | 0 refills | Status: AC

## 2022-07-13 MED ORDER — METOPROLOL TARTRATE 100 MG PO TABS: 100.0000 mg | ORAL_TABLET | Freq: Once | ORAL | 0 refills | Status: DC

## 2022-07-13 NOTE — Telephone Encounter (Signed)
Reaching out to patient to offer assistance regarding upcoming cardiac imaging study; pt verbalizes understanding of appt date/time, parking situation and where to check in, pre-test NPO status and medications ordered, and verified current allergies; name and call back number provided for further questions should they arise Marchia Bond RN Navigator Cardiac Imaging [facility] [phone] office [phone] cell  Attempt # 2 with 146m metoprolol tartrate + 165mivabradine 2 hr prior to scan Aware of nitro Denies iv issues  Arrival 130 w/c entrance Holding spironolactone, lasix

## 2022-07-13 NOTE — Telephone Encounter (Signed)
Attempted to call patient regarding upcoming cardiac CT appointment. °Left message on voicemail with name and callback number °  RN Navigator Cardiac Imaging °[facility] °[phone] Office °[phone] Cell ° °

## 2022-07-14 ENCOUNTER — Ambulatory Visit (HOSPITAL_COMMUNITY)
Admission: RE | Admit: 2022-07-14 | Discharge: 2022-07-14 | Disposition: A | Discharge status: Home / Self Care | Payer: Medicare PPO | Source: Ambulatory Visit | Attending: Cardiovascular Disease | Admitting: Cardiovascular Disease

## 2022-07-14 ENCOUNTER — Other Ambulatory Visit: Payer: Self-pay | Admitting: Cardiology

## 2022-07-14 ENCOUNTER — Ambulatory Visit: Payer: Medicare PPO

## 2022-07-14 DIAGNOSIS — R188 Other ascites: Secondary | ICD-10-CM | POA: Diagnosis not present

## 2022-07-14 DIAGNOSIS — R42 Dizziness and giddiness: Secondary | ICD-10-CM | POA: Diagnosis not present

## 2022-07-14 DIAGNOSIS — R931 Abnormal findings on diagnostic imaging of heart and coronary circulation: Secondary | ICD-10-CM

## 2022-07-14 DIAGNOSIS — R55 Syncope and collapse: Secondary | ICD-10-CM | POA: Insufficient documentation

## 2022-07-14 DIAGNOSIS — M79621 Pain in right upper arm: Secondary | ICD-10-CM | POA: Insufficient documentation

## 2022-07-14 DIAGNOSIS — R011 Cardiac murmur, unspecified: Secondary | ICD-10-CM | POA: Insufficient documentation

## 2022-07-14 DIAGNOSIS — M79622 Pain in left upper arm: Secondary | ICD-10-CM | POA: Diagnosis not present

## 2022-07-14 DIAGNOSIS — R5383 Other fatigue: Secondary | ICD-10-CM | POA: Diagnosis not present

## 2022-07-14 DIAGNOSIS — R0609 Other forms of dyspnea: Secondary | ICD-10-CM | POA: Insufficient documentation

## 2022-07-14 DIAGNOSIS — K7460 Unspecified cirrhosis of liver: Secondary | ICD-10-CM | POA: Insufficient documentation

## 2022-07-14 DIAGNOSIS — I10 Essential (primary) hypertension: Secondary | ICD-10-CM | POA: Diagnosis not present

## 2022-07-14 DIAGNOSIS — I2510 Atherosclerotic heart disease of native coronary artery without angina pectoris: Secondary | ICD-10-CM | POA: Diagnosis not present

## 2022-07-14 DIAGNOSIS — I2584 Coronary atherosclerosis due to calcified coronary lesion: Secondary | ICD-10-CM | POA: Diagnosis not present

## 2022-07-14 MED ORDER — NITROGLYCERIN 0.4 MG SL SUBL
0.8000 mg | SUBLINGUAL_TABLET | Freq: Once | SUBLINGUAL | Status: AC
  Administered 2022-07-14: 0.8 mg via SUBLINGUAL

## 2022-07-14 MED ORDER — METOPROLOL TARTRATE 5 MG/5ML IV SOLN
INTRAVENOUS | Status: AC
  Filled 2022-07-14: qty 5

## 2022-07-14 MED ORDER — IOHEXOL 350 MG/ML SOLN
100.0000 mL | Freq: Once | INTRAVENOUS | Status: AC | PRN
  Administered 2022-07-14: 100 mL via INTRAVENOUS

## 2022-07-14 MED ORDER — METOPROLOL TARTRATE 5 MG/5ML IV SOLN
5.0000 mg | INTRAVENOUS | Status: DC | PRN
  Administered 2022-07-14: 5 mg via INTRAVENOUS

## 2022-07-14 MED ORDER — NITROGLYCERIN 0.4 MG SL SUBL
SUBLINGUAL_TABLET | SUBLINGUAL | Status: AC
  Filled 2022-07-14: qty 2

## 2022-07-15 ENCOUNTER — Telehealth: Payer: Self-pay | Admitting: Cardiovascular Disease

## 2022-07-15 ENCOUNTER — Ambulatory Visit (HOSPITAL_BASED_OUTPATIENT_CLINIC_OR_DEPARTMENT_OTHER)
Admission: RE | Admit: 2022-07-15 | Discharge: 2022-07-15 | Disposition: A | Discharge status: Home / Self Care | Payer: Medicare PPO | Source: Ambulatory Visit | Attending: Cardiology | Admitting: Cardiology

## 2022-07-15 ENCOUNTER — Ambulatory Visit (INDEPENDENT_AMBULATORY_CARE_PROVIDER_SITE_OTHER): Payer: Medicare PPO

## 2022-07-15 VITALS — BP 118/66 | HR 71 | Temp 97.8°F | Resp 18 | Ht 63.0 in | Wt 169.6 lb

## 2022-07-15 DIAGNOSIS — R011 Cardiac murmur, unspecified: Secondary | ICD-10-CM | POA: Diagnosis not present

## 2022-07-15 DIAGNOSIS — M79622 Pain in left upper arm: Secondary | ICD-10-CM | POA: Diagnosis not present

## 2022-07-15 DIAGNOSIS — D509 Iron deficiency anemia, unspecified: Secondary | ICD-10-CM

## 2022-07-15 DIAGNOSIS — D500 Iron deficiency anemia secondary to blood loss (chronic): Secondary | ICD-10-CM

## 2022-07-15 DIAGNOSIS — R42 Dizziness and giddiness: Secondary | ICD-10-CM | POA: Diagnosis not present

## 2022-07-15 DIAGNOSIS — R5383 Other fatigue: Secondary | ICD-10-CM | POA: Diagnosis not present

## 2022-07-15 DIAGNOSIS — R931 Abnormal findings on diagnostic imaging of heart and coronary circulation: Secondary | ICD-10-CM

## 2022-07-15 DIAGNOSIS — K922 Gastrointestinal hemorrhage, unspecified: Secondary | ICD-10-CM | POA: Diagnosis not present

## 2022-07-15 DIAGNOSIS — R0609 Other forms of dyspnea: Secondary | ICD-10-CM | POA: Diagnosis not present

## 2022-07-15 DIAGNOSIS — R55 Syncope and collapse: Secondary | ICD-10-CM | POA: Diagnosis not present

## 2022-07-15 DIAGNOSIS — I10 Essential (primary) hypertension: Secondary | ICD-10-CM | POA: Diagnosis not present

## 2022-07-15 DIAGNOSIS — M79621 Pain in right upper arm: Secondary | ICD-10-CM | POA: Diagnosis not present

## 2022-07-15 MED ORDER — SODIUM CHLORIDE 0.9 % IV SOLN
300.0000 mg | Freq: Once | INTRAVENOUS | Status: AC
  Administered 2022-07-15: 300 mg via INTRAVENOUS
  Filled 2022-07-15: qty 15

## 2022-07-15 MED ORDER — DIPHENHYDRAMINE HCL 25 MG PO CAPS
25.0000 mg | ORAL_CAPSULE | Freq: Once | ORAL | Status: AC
  Administered 2022-07-15: 25 mg via ORAL
  Filled 2022-07-15: qty 1

## 2022-07-15 MED ORDER — ACETAMINOPHEN 325 MG PO TABS
650.0000 mg | ORAL_TABLET | Freq: Once | ORAL | Status: AC
  Administered 2022-07-15: 650 mg via ORAL
  Filled 2022-07-15: qty 2

## 2022-07-15 NOTE — Progress Notes (Signed)
Diagnosis: Iron Deficiency Anemia  Provider:  Marshell Garfinkel MD  Procedure: Infusion  IV Type: Peripheral, IV Location: L Antecubital  Venofer (Iron Sucrose), Dose: 300 mg  Infusion Start Time: 1740  Infusion Stop Time: 8144  Post Infusion IV Care: Peripheral IV Discontinued  Discharge: Condition: Good, Destination: Home . AVS provided to patient.   Performed by:  Cleophus Molt, RN

## 2022-07-15 NOTE — Telephone Encounter (Signed)
Advised pt that I don't have results as of yet. Pt understands. She did request to have her appt moved up so she can be seen sooner. Rescheduled for 07/18/22

## 2022-07-15 NOTE — Telephone Encounter (Signed)
Patient is calling requesting her CT results. Please advise.

## 2022-07-17 ENCOUNTER — Encounter: Payer: Self-pay | Admitting: Cardiovascular Disease

## 2022-07-17 NOTE — Progress Notes (Signed)
Cardiology Office Note:    Date:  07/18/2022   ID:  Dana Dyer, DOB 05-05-1951, MRN [id number]  PCP:  Dana Post, MD   [facility] Providers Cardiologist:    }    Referring MD: Dana Post, MD   Chief Complaint  Patient presents with   Dizziness    History of Present Illness:    Dana Dyer is a 71 y.o. female with a hx of HTN, , DM Has been told she is dehydrated by her rheumatologist  Now is here with hypotension episodes   Has NASH Is on Aldactone 50 for her cirrhosis  Is also on Losartan 100 ( for her DM)  Amlodipine 5 mg a day  No CP or dyspnea  with exertion  Gets tired in her arms when she walks up hill  No chest tightness, no intrascapular pain  Does have presyncope with exertion  No real symptoms at rest.   Has had DM for ~ 20 years  Some of her symptoms are suggestive of CAD ( Exertional arm fatigue, dyspnea   Aug. 14, 2023  Seen with daughter, Dana Dyer is seen for follow up of her HTN and orthostatic hypotension Coronary CT angiogram shows a coronary calcium score of 0.  He has a moderate stenosis in the mid LAD and a moderate to severe stenosis and a very small first diagonal branch.  She was incidentally noted to have some thickening in his esophagus. Is limited to 24 oz of water intake a day    Past Medical History:  Diagnosis Date   ALLERGIC RHINITIS 10/09/2007   Allergy    Arthritis    hips, spine   DIABETES MELLITUS, UNCONTROLLED 03/16/2009   FATIGUE 10/21/2008   FIBROMYALGIA 10/09/2007   GERD (gastroesophageal reflux disease)    diet  controlled, no meds   H/O vaginal hysterectomy    HYPERLIPIDEMIA 10/09/2007   diet controlled, no meds   HYPERTENSION 10/09/2007   Morton's neuroma    OBSTRUCTIVE SLEEP APNEA 10/09/2007   Palpitations    Plantar fasciitis    History - right   Sleep apnea    Uses CPAP   TRANSAMINASES, SERUM, ELEVATED 07/08/2009    Past Surgical History:  Procedure  Laterality Date   ABDOMINAL HYSTERECTOMY  1979   prolapsed uterus   BASAL CELL CARCINOMA EXCISION     BREAST BIOPSY Left 2012   left 2012 stereo   BREAST REDUCTION SURGERY Bilateral 1992   COLONOSCOPY  2006   [place]   mamoplasty     reduction   REDUCTION MAMMAPLASTY Bilateral 1992   TONSILLECTOMY  1959   TUBAL LIGATION  1976    Current Medications: Current Meds  Medication Sig   BD PEN NEEDLE NANO 2ND GEN 32G X 4 MM MISC USE AS DIRECTED   Continuous Blood Gluc Sensor (DEXCOM G7 SENSOR) MISC 1 Device by Does not apply route as directed. Every 10 days   escitalopram (LEXAPRO) 10 MG tablet TAKE 1 TABLET BY MOUTH EVERY DAY   famotidine (PEPCID) 20 MG tablet Take 20 mg by mouth at bedtime.   furosemide (LASIX) 20 MG tablet TAKE 1 TABLET BY MOUTH EVERY DAY (Patient taking differently: Take 10 mg by mouth daily.)   insulin glargine (LANTUS SOLOSTAR) 100 UNIT/ML Solostar Pen Inject 30 Units into the skin daily.   insulin lispro (HUMALOG KWIKPEN) 200 UNIT/ML KwikPen Max Daily 120   KLOR-CON M20 20 MEQ tablet TAKE 1 TABLET BY MOUTH EVERY DAY (  Patient taking differently: every other day.)   lactulose (CHRONULAC) 10 GM/15ML solution TAKE 6 TABLESPOONS DAILY (Patient taking differently: TAKE 3 TABLESPOONS DAILY)   metFORMIN (GLUCOPHAGE) 1000 MG tablet TAKE 1 TABLET BY MOUTH TWICE A DAY   spironolactone (ALDACTONE) 50 MG tablet Take 1 tablet (50 mg total) by mouth every other day.   Current Facility-Administered Medications for the 07/18/22 encounter (Office Visit) with , Wonda Cheng, MD  Medication   TDaP (BOOSTRIX) injection 0.5 mL     Allergies:   Amoxicillin-pot clavulanate, Codeine sulfate, Diflucan [fluconazole], Erythromycin base, Invokana [canagliflozin], Irbesartan-hydrochlorothiazide, Latex, Victoza [liraglutide], and Ozempic (0.25 or 0.5 mg-dose) [semaglutide(0.25 or 0.8m-dos)]   Social History   Socioeconomic History   Marital status: Married    Spouse name: Not on file    Number of children: Not on file   Years of education: Not on file   Highest education level: Not on file  Occupational History   Not on file  Tobacco Use   Smoking status: Never   Smokeless tobacco: Never  Vaping Use   Vaping Use: Never used  Substance and Sexual Activity   Alcohol use: No    Alcohol/week: 0.0 standard drinks of alcohol   Drug use: No   Sexual activity: Yes    Birth control/protection: Other-see comments, Dyer-menopausal    Comment: Hysterectomy  Other Topics Concern   Not on file  Social History Narrative   Not on file   Social Determinants of Health   Financial Resource Strain: Low Risk  (08/04/2021)   Overall Financial Resource Strain (CARDIA)    Difficulty of Paying Living Expenses: Not hard at all  Food Insecurity: No Food Insecurity (08/04/2021)   Hunger Vital Sign    Worried About Running Out of Food in the Last Year: Never true    Ran Out of Food in the Last Year: Never true  Transportation Needs: No Transportation Needs (08/04/2021)   PRAPARE - THydrologist(Medical): No    Lack of Transportation (Non-Medical): No  Physical Activity: Insufficiently Active (08/04/2021)   Exercise Vital Sign    Days of Exercise per Week: 3 days    Minutes of Exercise per Session: 30 min  Stress: No Stress Concern Present (08/04/2021)   [place]   Feeling of Stress : Not at all  Social Connections: Unknown (08/04/2021)   Social Connection and Isolation Panel [NHANES]    Frequency of Communication with Friends and Family: Three times a week    Frequency of Social Gatherings with Friends and Family: Not on file    Attends Religious Services: Not on file    Active Member of Clubs or Organizations: No    Attends CArchivistMeetings: Never    Marital Status: Married     Family History: The patient's family history includes Alcohol abuse in her father; Arthritis in  her father and mother; Breast cancer in her maternal aunt; Breast cancer (age of onset: 769 in her mother; Cancer in her mother; Diabetes in her mother; Hypertension in her maternal grandfather; Miscarriages / [place] her paternal grandfather. There is no history of Colon polyps, Colon cancer, Esophageal cancer, Rectal cancer, or Stomach cancer.  ROS:   Please see the history of present illness.     All other systems reviewed and are negative.  EKGs/Labs/Other Studies Reviewed:    The following studies were reviewed today:   EKG:  05/10/22- NSR  84.  Poor R wave progression.   Likely lead placement   Recent Labs: 05/10/2022: Magnesium 1.5 06/28/2022: ALT 44; BUN 20; Creatinine 0.87; Hemoglobin 9.2; Platelet Count 123; Potassium 4.5; Sodium 126; TSH 2.736  Recent Lipid Panel    Component Value Date/Time   CHOL 154 09/30/2021 1048   TRIG 128.0 09/30/2021 1048   HDL 59.30 09/30/2021 1048   CHOLHDL 3 09/30/2021 1048   VLDL 25.6 09/30/2021 1048   LDLCALC 69 09/30/2021 1048   LDLDIRECT 124.9 10/14/2010 0816     Risk Assessment/Calculations:           Physical Exam:    VS:  BP (!) 140/62   Pulse 97   Ht 5' 3"  (1.6 m)   Wt 168 lb 12.8 oz (76.6 kg)   SpO2 97%   BMI 29.90 kg/m     Wt Readings from Last 3 Encounters:  07/18/22 169 lb 12.8 oz (77 kg)  07/18/22 168 lb 12.8 oz (76.6 kg)  07/15/22 169 lb 9.6 oz (76.9 kg)     GEN:  Well nourished, well developed in no acute distress HEENT: Normal NECK: No JVD; No carotid bruits LYMPHATICS: No lymphadenopathy CARDIAC: RRR, ,  2/6 systolic murmur at ULSB .  Does not  seem to radiate to the L axillary  RESPIRATORY:  Clear to auscultation without rales, wheezing or rhonchi  ABDOMEN: Soft, non-tender, non-distended MUSCULOSKELETAL:  No edema; No deformity  SKIN: Warm and dry NEUROLOGIC:  Alert and oriented x 3 PSYCHIATRIC:  Normal affect   ASSESSMENT:    1. DOE (dyspnea on exertion)   2. Mixed hyperlipidemia     PLAN:     In order of problems listed above:  Hypotension: She is on numerous medications that could contribute to her hypotension.  We will discontinue the amlodipine.  We will continue the diuretics since they will be useful in volume control in the setting of nonalcoholic liver cirrhosis.  We will also continue the losartan since there is a benefit to diabetics. Exercise intolerance: She is having arm fatigue and severe shortness of breath with exertion.  She has a long history of diabetes.  She may have developed some coronary artery disease.  We will perform a coronary CT angiogram for further evaluation.  3.  Systolic murmur: She has a soft murmur on exam.  Sounds like she may have some tricuspid regurgitation and/or aortic stenosis.  She also has had some generalized exercise intolerance.  We will get an echocardiogram for further evaluation.  We will see her again in 3 months for follow-up visit      Shared Decision Making/Informed Consent{   Medication Adjustments/Labs and Tests Ordered: Current medicines are reviewed at length with the patient today.  Concerns regarding medicines are outlined above.  No orders of the defined types were placed in this encounter.  No orders of the defined types were placed in this encounter.   Patient Instructions  Medication Instructions:  Your physician recommends that you continue on your current medications as directed. Please refer to the Current Medication list given to you today.  *If you need a refill on your cardiac medications before your next appointment, please call your pharmacy*   Lab Work: NONE If you have labs (blood work) drawn today and your tests are completely normal, you will receive your results only by: [place] (if you have MyChart) OR A paper copy in the mail If you have any lab test that is abnormal or we need to  change your treatment, we will call you to review the  results.   Testing/Procedures: NONE   Follow-Up: At [facility], you and your health needs are our priority.  As part of our continuing mission to provide you with exceptional heart care, we have created designated Provider Care Teams.  These Care Teams include your primary Cardiologist (physician) and Advanced Practice Providers (APPs -  Physician Assistants and Nurse Practitioners) who all work together to provide you with the care you need, when you need it.  We recommend signing up for the patient portal called "MyChart".  Sign up information is provided on this After Visit Summary.  MyChart is used to connect with patients for Virtual Visits (Telemedicine).  Patients are able to view lab/test results, encounter notes, upcoming appointments, etc.  Non-urgent messages can be sent to your provider as well.   To learn more about what you can do with MyChart, go to [email or web address].    Your next appointment:   1 year(s)  The format for your next appointment:   In Person  Provider:   Mertie Moores, MD     Important Information About Sugar         Signed, Mertie Moores, MD  07/18/2022 5:35 PM    [place]

## 2022-07-18 ENCOUNTER — Ambulatory Visit: Payer: Medicare PPO | Admitting: Family Medicine

## 2022-07-18 ENCOUNTER — Encounter: Payer: Self-pay | Admitting: Family Medicine

## 2022-07-18 ENCOUNTER — Encounter: Payer: Self-pay | Admitting: Cardiovascular Disease

## 2022-07-18 ENCOUNTER — Ambulatory Visit: Payer: Medicare PPO | Admitting: Cardiovascular Disease

## 2022-07-18 VITALS — BP 138/62 | HR 100 | Temp 97.8°F | Ht 63.0 in | Wt 169.8 lb

## 2022-07-18 VITALS — BP 140/62 | HR 97 | Ht 63.0 in | Wt 168.8 lb

## 2022-07-18 DIAGNOSIS — R0609 Other forms of dyspnea: Secondary | ICD-10-CM | POA: Diagnosis not present

## 2022-07-18 DIAGNOSIS — D509 Iron deficiency anemia, unspecified: Secondary | ICD-10-CM | POA: Diagnosis not present

## 2022-07-18 DIAGNOSIS — E782 Mixed hyperlipidemia: Secondary | ICD-10-CM

## 2022-07-18 DIAGNOSIS — K7460 Unspecified cirrhosis of liver: Secondary | ICD-10-CM

## 2022-07-18 DIAGNOSIS — R58 Hemorrhage, not elsewhere classified: Secondary | ICD-10-CM | POA: Diagnosis not present

## 2022-07-18 NOTE — Progress Notes (Signed)
Established Patient Office Visit  Subjective   Patient ID: Dana Dyer, female    DOB: 11-Oct-1951  Age: 71 y.o. MRN: [id number]  Chief Complaint  Patient presents with   Bleeding/Bruising    Patient complains of bruising on left arm,    Shortness of Breath    Patient complains of shortness of breath    HPI   Ms. Frick has history of type 2 diabetes, cirrhosis secondary to nonalcoholic fatty liver disease, obstructive sleep apnea, hypertension, history of low B12, iron deficiency anemia, hyperlipidemia, and recurrent depression.  Followed by multiple specialists including cardiology, endocrinology, and heme-onc.  She has iron deficiency anemia and is followed closely by GI.  Has had some recent iron infusions.  Hemoglobin 9 range.  She has slightly low platelets with recent platelet count 123,000.  She also has had some dizziness and dyspnea with activities and had recent echocardiogram with EF 60-65% with mild to moderate aortic stenosis  She had CT morphology evaluation.  Coronary calcium score is 0.  Mild mid LAD stenosis 30 to 49%.  Moderate 50 to 69% stenosis small first diagonal branch.  She was having some low blood pressures and was taken off amlodipine and apparently also not taking losartan now.  She does remain on Aldactone which she takes for her cirrhosis.  No recent peripheral edema.  Has not required any recent Lasix dosing.  Has had some recent bruising left forearm.  She has a very small dog which apparently constantly scratches against her arm.  No general bruising.  No recent nosebleeds or gum bleeding.  She has had poorly controlled diabetes for some time but finally under decent control recently with Lantus and sliding scale Humalog.  Followed by endocrinology.  Past Medical History:  Diagnosis Date   ALLERGIC RHINITIS 10/09/2007   Allergy    Arthritis    hips, spine   DIABETES MELLITUS, UNCONTROLLED 03/16/2009   FATIGUE 10/21/2008   FIBROMYALGIA  10/09/2007   GERD (gastroesophageal reflux disease)    diet  controlled, no meds   H/O vaginal hysterectomy    HYPERLIPIDEMIA 10/09/2007   diet controlled, no meds   HYPERTENSION 10/09/2007   Morton's neuroma    OBSTRUCTIVE SLEEP APNEA 10/09/2007   Palpitations    Plantar fasciitis    History - right   Sleep apnea    Uses CPAP   TRANSAMINASES, SERUM, ELEVATED 07/08/2009   Past Surgical History:  Procedure Laterality Date   ABDOMINAL HYSTERECTOMY  1979   prolapsed uterus   BASAL CELL CARCINOMA EXCISION     BREAST BIOPSY Left 2012   left 2012 stereo   BREAST REDUCTION SURGERY Bilateral 1992   COLONOSCOPY  2006   [place]   mamoplasty     reduction   REDUCTION MAMMAPLASTY Bilateral [place]    reports that she has never smoked. She has never used smokeless tobacco. She reports that she does not drink alcohol and does not use drugs. family history includes Alcohol abuse in her father; Arthritis in her father and mother; Breast cancer in her maternal aunt; Breast cancer (age of onset: 64) in her mother; Cancer in her mother; Diabetes in her mother; Hypertension in her maternal grandfather; Miscarriages / [place] in her paternal grandfather. Allergies  Allergen Reactions   Amoxicillin-Pot Clavulanate     Severe diarrhea   Codeine Sulfate     REACTION: "hyper"   Diflucan [Fluconazole] Hives   Erythromycin Base  Invokana [Canagliflozin] Other (See Comments)    Recurrent yeast infections   Irbesartan-Hydrochlorothiazide    Latex    Victoza [Liraglutide] Nausea Only   Ozempic (0.25 Or 0.5 Mg-Dose) [Semaglutide(0.25 Or 0.16m-Dos)] Nausea Only    Review of Systems  Constitutional:  Negative for chills, fever and weight loss.  Respiratory:  Negative for cough and hemoptysis.   Cardiovascular:  Negative for chest pain and leg swelling.  Genitourinary:  Negative for dysuria.  Neurological:  Positive for dizziness. Negative for  focal weakness.      Objective:     BP 138/62 (BP Location: Left Arm, Patient Position: Sitting, Cuff Size: Normal)   Pulse 100   Temp 97.8 F (36.6 C) (Oral)   Ht 5' 3"  (1.6 m)   Wt 169 lb 12.8 oz (77 kg)   SpO2 98%   BMI 30.08 kg/m    Physical Exam Vitals reviewed.  Constitutional:      General: She is not in acute distress.    Appearance: She is not ill-appearing.  Cardiovascular:     Rate and Rhythm: Normal rate and regular rhythm.     Heart sounds: Murmur heard.     Comments: Systolic murmur heard right upper sternal border Pulmonary:     Effort: Pulmonary effort is normal.     Breath sounds: Normal breath sounds. No wheezing or rales.  Skin:    Comments: Several small areas of ecchymosis dorsum left forearm.  Minimal bruising right forearm.  No bruising of the upper arm region, lower extremities, or trunk  Neurological:     Mental Status: She is alert.      No results found for any visits on 07/18/22.    The 10-year ASCVD risk score (Arnett DK, et al., 2019) is: 29.9%    Assessment & Plan:   #1 localized bruising left forearm.  Not clinically significant likely.  She does have chronically slightly low platelets but does not take any aspirin or antiplatelet medications.   Recent platelet count stable 123,000.  Reassurance.  #2 dyspnea.  Recent echo ejection fraction 60-65%.  No major obstructive CAD.  We did discuss the fact that some of this may be deconditioning and also contributing is her anemia  #3 iron deficiency anemia.  Patient on iron infusions.  Hopefully hemoglobin will improve with that.  #4 history of hypotension.  Patient now off amlodipine and losartan.  Blood pressures improved.  Less dizziness.  Continue to monitor  #5 nonalcoholic fatty liver disease cirrhosis.  Followed closely by GI.  Patient on Aldactone.  Recent INR 1.1  No follow-ups on file.    BCarolann Littler MD

## 2022-07-18 NOTE — Patient Instructions (Signed)
Medication Instructions:  Your physician recommends that you continue on your current medications as directed. Please refer to the Current Medication list given to you today.  *If you need a refill on your cardiac medications before your next appointment, please call your pharmacy*   Lab Work: NONE If you have labs (blood work) drawn today and your tests are completely normal, you will receive your results only by: [place] (if you have MyChart) OR A paper copy in the mail If you have any lab test that is abnormal or we need to change your treatment, we will call you to review the results.   Testing/Procedures: NONE   Follow-Up: At [facility], you and your health needs are our priority.  As part of our continuing mission to provide you with exceptional heart care, we have created designated Provider Care Teams.  These Care Teams include your primary Cardiologist (physician) and Advanced Practice Providers (APPs -  Physician Assistants and Nurse Practitioners) who all work together to provide you with the care you need, when you need it.  We recommend signing up for the patient portal called "MyChart".  Sign up information is provided on this After Visit Summary.  MyChart is used to connect with patients for Virtual Visits (Telemedicine).  Patients are able to view lab/test results, encounter notes, upcoming appointments, etc.  Non-urgent messages can be sent to your provider as well.   To learn more about what you can do with MyChart, go to [email or web address].    Your next appointment:   1 year(s)  The format for your next appointment:   In Person  Provider:   Mertie Moores, MD     Important Information About Sugar

## 2022-07-18 NOTE — Progress Notes (Signed)
Noted.  She just had EGD 4/23 with esophageal biopsies taken at that time.

## 2022-07-20 ENCOUNTER — Ambulatory Visit (INDEPENDENT_AMBULATORY_CARE_PROVIDER_SITE_OTHER): Payer: Medicare PPO

## 2022-07-20 VITALS — BP 128/74 | HR 92 | Temp 98.7°F | Resp 16 | Ht 63.0 in | Wt 168.8 lb

## 2022-07-20 DIAGNOSIS — K922 Gastrointestinal hemorrhage, unspecified: Secondary | ICD-10-CM

## 2022-07-20 DIAGNOSIS — D500 Iron deficiency anemia secondary to blood loss (chronic): Secondary | ICD-10-CM | POA: Diagnosis not present

## 2022-07-20 DIAGNOSIS — D509 Iron deficiency anemia, unspecified: Secondary | ICD-10-CM

## 2022-07-20 MED ORDER — SODIUM CHLORIDE 0.9 % IV SOLN
300.0000 mg | Freq: Once | INTRAVENOUS | Status: AC
  Administered 2022-07-20: 300 mg via INTRAVENOUS
  Filled 2022-07-20: qty 15

## 2022-07-20 MED ORDER — ACETAMINOPHEN 325 MG PO TABS
650.0000 mg | ORAL_TABLET | Freq: Once | ORAL | Status: AC
  Administered 2022-07-20: 650 mg via ORAL
  Filled 2022-07-20: qty 2

## 2022-07-20 MED ORDER — DIPHENHYDRAMINE HCL 25 MG PO CAPS
25.0000 mg | ORAL_CAPSULE | Freq: Once | ORAL | Status: AC
  Administered 2022-07-20: 25 mg via ORAL
  Filled 2022-07-20: qty 1

## 2022-07-20 NOTE — Progress Notes (Signed)
Diagnosis: Iron Deficiency Anemia  Provider:  Marshell Garfinkel MD  Procedure: Infusion  IV Type: Peripheral, IV Location: R Antecubital  Venofer (Iron Sucrose), Dose: 300 mg  Infusion Start Time: 2589  Infusion Stop Time: 1600  Post Infusion IV Care: Peripheral IV Discontinued  Discharge: Condition: Good, Destination: Home . AVS provided to patient.   Performed by:  Arnoldo Morale, RN

## 2022-07-21 ENCOUNTER — Ambulatory Visit: Payer: Medicare PPO

## 2022-07-23 ENCOUNTER — Other Ambulatory Visit: Payer: Self-pay | Admitting: Family Medicine

## 2022-07-23 DIAGNOSIS — F339 Major depressive disorder, recurrent, unspecified: Secondary | ICD-10-CM

## 2022-07-25 ENCOUNTER — Telehealth: Payer: Self-pay

## 2022-07-25 ENCOUNTER — Encounter: Payer: Medicare PPO | Admitting: Family Medicine

## 2022-07-25 NOTE — Telephone Encounter (Addendum)
-----   Message from Rigoberto Noel, MD sent at 07/21/2022 10:04 PM EDT -----  We will get her in to see me at the [facility] location [place], can you arrange please - next available is ok  RA ----- Message ----- From: Thayer Headings, MD Sent: 07/18/2022  12:02 PM EDT To: Rigoberto Noel, MD  Hi Gilford Raid is a patient I have seen for worsening dyspnea with exertion Coronary CTA shows minimal CAD Echo show mild valvular abnormalities.  Normal LV systolic function.  we were not able to assess her diastolic dysfunction .  She would like to see you for pulmonary consultation .   ( She mentioned her husband had pulmonary fibrosis)   We discussed getting a cardiopulmonary stress test.  We also discussed possibly increasing her fluid intake but she is limited in her fluids due to NASH.   I see that you are no vacation this week.  Hope you had a nice vacation.  Abbe [place]

## 2022-07-25 NOTE — Telephone Encounter (Signed)
ATC patient LMTCB with [place] office number.

## 2022-07-27 NOTE — Telephone Encounter (Signed)
Patient called and said her cirrhosis is bothering her and she is in pain. Please advise, thank you.

## 2022-07-27 NOTE — Telephone Encounter (Signed)
Patient calling to follow up on previous message seeking advise for cirrhosis.

## 2022-07-28 ENCOUNTER — Telehealth: Payer: Self-pay | Admitting: Gastroenterology

## 2022-07-28 DIAGNOSIS — K7460 Unspecified cirrhosis of liver: Secondary | ICD-10-CM

## 2022-07-28 NOTE — Telephone Encounter (Signed)
The pt has been contacted and aware to observe her symptoms and if they return she should go to the ED. She will have labs tomorrow-orders entered.  She will keep appt as planned-she is not able to accept the sooner appt offered.  She will expect a call from the schedulers to set up [place].

## 2022-07-28 NOTE — Telephone Encounter (Signed)
Pt with history of cirrhosis last seen by Dr Ardis Hughs on 05/27/22. She began to have confusion, disorientation, felt like she was running a fever last night, vomiting, up to 5 diarrhea episodes 2 nights ago.    She is taking lactulose 7 tablespoons daily-no BM since 2 nights ago.    Feels better today no confusion, no fever,  no vomiting.  Some nausea.    Dr Carlean Purl as DOD can you please review?  Would you like labs? She has a follow up on 9/29 with Colleen.  Also, looks like she is past due for [place]. I will enter that order and send to the schedulers.

## 2022-07-28 NOTE — Telephone Encounter (Signed)
Patient returned your call. Requesting a call back. Please call to advise.

## 2022-07-28 NOTE — Telephone Encounter (Signed)
Inbound call from patient requesting to speak with a nurse in regards to her cirrhosis. Patient states she has been really sick and has build up of pneumonia. Patient states she had  temperature yesterday but is feeling much better today. She is feeling a bit nauseated. Please give a call back to further advise.  Thank you

## 2022-07-28 NOTE — Telephone Encounter (Signed)
Observe for repeat problems - seems like she may have had a transient GI infection  Agree w/ scheduling [place]  Would get her an earlier APP appointment if possible  Do CBC, CMET, INR, AFP and ammonia tomorrow please  If gets sick again like that would go to ED  Expecting BM's to return - may be slow to return since diarrhea but we need to know if she stays constipated

## 2022-07-28 NOTE — Telephone Encounter (Signed)
See alternate phone note dated 8/24

## 2022-07-29 ENCOUNTER — Emergency Department (HOSPITAL_BASED_OUTPATIENT_CLINIC_OR_DEPARTMENT_OTHER): Payer: Medicare PPO

## 2022-07-29 ENCOUNTER — Encounter (HOSPITAL_BASED_OUTPATIENT_CLINIC_OR_DEPARTMENT_OTHER): Payer: Self-pay

## 2022-07-29 ENCOUNTER — Inpatient Hospital Stay (HOSPITAL_BASED_OUTPATIENT_CLINIC_OR_DEPARTMENT_OTHER)
Admission: EM | Admit: 2022-07-29 | Discharge: 2022-08-01 | DRG: 641 | Disposition: A | Discharge status: Home / Self Care | Payer: Medicare PPO | Attending: Internal Medicine | Admitting: Internal Medicine

## 2022-07-29 ENCOUNTER — Other Ambulatory Visit (INDEPENDENT_AMBULATORY_CARE_PROVIDER_SITE_OTHER): Payer: Medicare PPO

## 2022-07-29 ENCOUNTER — Other Ambulatory Visit: Payer: Self-pay

## 2022-07-29 ENCOUNTER — Encounter (HOSPITAL_COMMUNITY): Payer: Self-pay

## 2022-07-29 DIAGNOSIS — E860 Dehydration: Secondary | ICD-10-CM | POA: Diagnosis present

## 2022-07-29 DIAGNOSIS — Z85828 Personal history of other malignant neoplasm of skin: Secondary | ICD-10-CM

## 2022-07-29 DIAGNOSIS — K7460 Unspecified cirrhosis of liver: Secondary | ICD-10-CM

## 2022-07-29 DIAGNOSIS — M797 Fibromyalgia: Secondary | ICD-10-CM | POA: Diagnosis present

## 2022-07-29 DIAGNOSIS — E1165 Type 2 diabetes mellitus with hyperglycemia: Secondary | ICD-10-CM

## 2022-07-29 DIAGNOSIS — K219 Gastro-esophageal reflux disease without esophagitis: Secondary | ICD-10-CM | POA: Diagnosis present

## 2022-07-29 DIAGNOSIS — D638 Anemia in other chronic diseases classified elsewhere: Secondary | ICD-10-CM | POA: Diagnosis present

## 2022-07-29 DIAGNOSIS — Z8249 Family history of ischemic heart disease and other diseases of the circulatory system: Secondary | ICD-10-CM | POA: Diagnosis not present

## 2022-07-29 DIAGNOSIS — D696 Thrombocytopenia, unspecified: Secondary | ICD-10-CM | POA: Diagnosis not present

## 2022-07-29 DIAGNOSIS — K7469 Other cirrhosis of liver: Secondary | ICD-10-CM | POA: Diagnosis present

## 2022-07-29 DIAGNOSIS — Z803 Family history of malignant neoplasm of breast: Secondary | ICD-10-CM

## 2022-07-29 DIAGNOSIS — E785 Hyperlipidemia, unspecified: Secondary | ICD-10-CM | POA: Diagnosis present

## 2022-07-29 DIAGNOSIS — I10 Essential (primary) hypertension: Secondary | ICD-10-CM | POA: Diagnosis present

## 2022-07-29 DIAGNOSIS — D6959 Other secondary thrombocytopenia: Secondary | ICD-10-CM | POA: Diagnosis present

## 2022-07-29 DIAGNOSIS — Z880 Allergy status to penicillin: Secondary | ICD-10-CM | POA: Diagnosis not present

## 2022-07-29 DIAGNOSIS — Z79899 Other long term (current) drug therapy: Secondary | ICD-10-CM

## 2022-07-29 DIAGNOSIS — Z833 Family history of diabetes mellitus: Secondary | ICD-10-CM | POA: Diagnosis not present

## 2022-07-29 DIAGNOSIS — E871 Hypo-osmolality and hyponatremia: Secondary | ICD-10-CM | POA: Diagnosis present

## 2022-07-29 DIAGNOSIS — F32A Depression, unspecified: Secondary | ICD-10-CM | POA: Diagnosis present

## 2022-07-29 DIAGNOSIS — Z91040 Latex allergy status: Secondary | ICD-10-CM

## 2022-07-29 DIAGNOSIS — J9811 Atelectasis: Secondary | ICD-10-CM | POA: Diagnosis not present

## 2022-07-29 DIAGNOSIS — E861 Hypovolemia: Secondary | ICD-10-CM | POA: Diagnosis present

## 2022-07-29 DIAGNOSIS — F419 Anxiety disorder, unspecified: Secondary | ICD-10-CM | POA: Diagnosis present

## 2022-07-29 DIAGNOSIS — Z90710 Acquired absence of both cervix and uterus: Secondary | ICD-10-CM | POA: Diagnosis not present

## 2022-07-29 DIAGNOSIS — K760 Fatty (change of) liver, not elsewhere classified: Secondary | ICD-10-CM | POA: Diagnosis present

## 2022-07-29 DIAGNOSIS — Z794 Long term (current) use of insulin: Secondary | ICD-10-CM

## 2022-07-29 DIAGNOSIS — R7402 Elevation of levels of lactic acid dehydrogenase (LDH): Secondary | ICD-10-CM | POA: Diagnosis present

## 2022-07-29 DIAGNOSIS — K8689 Other specified diseases of pancreas: Secondary | ICD-10-CM | POA: Diagnosis not present

## 2022-07-29 DIAGNOSIS — Z7984 Long term (current) use of oral hypoglycemic drugs: Secondary | ICD-10-CM

## 2022-07-29 DIAGNOSIS — Z885 Allergy status to narcotic agent status: Secondary | ICD-10-CM

## 2022-07-29 LAB — DIFFERENTIAL
Abs Immature Granulocytes: 0.03 10*3/uL (ref 0.00–0.07)
Basophils Absolute: 0 10*3/uL (ref 0.0–0.1)
Basophils Relative: 1 %
Eosinophils Absolute: 0.1 10*3/uL (ref 0.0–0.5)
Eosinophils Relative: 2 %
Immature Granulocytes: 0 %
Lymphocytes Relative: 17 %
Lymphs Abs: 1.2 10*3/uL (ref 0.7–4.0)
Monocytes Absolute: 1 10*3/uL (ref 0.1–1.0)
Monocytes Relative: 14 %
Neutro Abs: 4.7 10*3/uL (ref 1.7–7.7)
Neutrophils Relative %: 66 %

## 2022-07-29 LAB — COMPREHENSIVE METABOLIC PANEL
ALT: 77 U/L — ABNORMAL HIGH (ref 0–35)
ALT: 83 U/L — ABNORMAL HIGH (ref 0–44)
AST: 58 U/L — ABNORMAL HIGH (ref 0–37)
AST: 75 U/L — ABNORMAL HIGH (ref 15–41)
Albumin: 3.7 g/dL (ref 3.5–5.2)
Albumin: 3.9 g/dL (ref 3.5–5.0)
Alkaline Phosphatase: 89 U/L (ref 39–117)
Alkaline Phosphatase: 84 U/L (ref 38–126)
Anion gap: 10 (ref 5–15)
BUN: 16 mg/dL (ref 6–23)
BUN: 17 mg/dL (ref 8–23)
CO2: 25 mEq/L (ref 19–32)
CO2: 21 mmol/L — ABNORMAL LOW (ref 22–32)
Calcium: 9.1 mg/dL (ref 8.4–10.5)
Calcium: 9.9 mg/dL (ref 8.9–10.3)
Chloride: 89 mEq/L — ABNORMAL LOW (ref 96–112)
Chloride: 90 mmol/L — ABNORMAL LOW (ref 98–111)
Creatinine, Ser: 0.96 mg/dL (ref 0.40–1.20)
Creatinine, Ser: 0.95 mg/dL (ref 0.44–1.00)
GFR, Estimated: >60 mL/min (ref >60)
GFR: 58.93 mL/min — ABNORMAL LOW (ref >60.00)
Glucose, Bld: 210 mg/dL — ABNORMAL HIGH (ref 70–99)
Glucose, Bld: 164 mg/dL — ABNORMAL HIGH (ref 70–99)
Potassium: 4.9 mEq/L (ref 3.5–5.1)
Potassium: 5.2 mmol/L — ABNORMAL HIGH (ref 3.5–5.1)
Sodium: 120 mEq/L — CL (ref 135–145)
Sodium: 121 mmol/L — ABNORMAL LOW (ref 135–145)
Total Bilirubin: 2.7 mg/dL — ABNORMAL HIGH (ref 0.2–1.2)
Total Bilirubin: 2.9 mg/dL — ABNORMAL HIGH (ref 0.3–1.2)
Total Protein: 6.6 g/dL (ref 6.0–8.3)
Total Protein: 7.2 g/dL (ref 6.5–8.1)

## 2022-07-29 LAB — URINALYSIS, ROUTINE W REFLEX MICROSCOPIC
Bilirubin Urine: NEGATIVE
Glucose, UA: NEGATIVE mg/dL
Hgb urine dipstick: NEGATIVE
Ketones, ur: NEGATIVE mg/dL
Leukocytes,Ua: NEGATIVE
Nitrite: NEGATIVE
Protein, ur: NEGATIVE mg/dL
Specific Gravity, Urine: >1.046 — ABNORMAL HIGH (ref 1.005–1.030)
pH: 5.5 (ref 5.0–8.0)

## 2022-07-29 LAB — CBC WITH DIFFERENTIAL/PLATELET
Basophils Absolute: 0 10*3/uL (ref 0.0–0.1)
Basophils Relative: 0.3 % (ref 0.0–3.0)
Eosinophils Absolute: 0.1 10*3/uL (ref 0.0–0.7)
Eosinophils Relative: 2.6 % (ref 0.0–5.0)
HCT: 33.7 % — ABNORMAL LOW (ref 36.0–46.0)
Hemoglobin: 11.3 g/dL — ABNORMAL LOW (ref 12.0–15.0)
Lymphocytes Relative: 16.6 % (ref 12.0–46.0)
Lymphs Abs: 0.7 10*3/uL (ref 0.7–4.0)
MCHC: 33.5 g/dL (ref 30.0–36.0)
MCV: 81.5 fl (ref 78.0–100.0)
Monocytes Absolute: 0.5 10*3/uL (ref 0.1–1.0)
Monocytes Relative: 11.9 % (ref 3.0–12.0)
Neutro Abs: 3.1 10*3/uL (ref 1.4–7.7)
Neutrophils Relative %: 68.6 % (ref 43.0–77.0)
Platelets: 99 10*3/uL — ABNORMAL LOW (ref 150.0–400.0)
RBC: 4.14 Mil/uL (ref 3.87–5.11)
RDW: 27.5 % — ABNORMAL HIGH (ref 11.5–15.5)
WBC: 4.5 10*3/uL (ref 4.0–10.5)

## 2022-07-29 LAB — BASIC METABOLIC PANEL
Anion gap: 9 (ref 5–15)
BUN: 16 mg/dL (ref 8–23)
CO2: 22 mmol/L (ref 22–32)
Calcium: 9.3 mg/dL (ref 8.9–10.3)
Chloride: 92 mmol/L — ABNORMAL LOW (ref 98–111)
Creatinine, Ser: 0.89 mg/dL (ref 0.44–1.00)
GFR, Estimated: >60 mL/min (ref >60)
Glucose, Bld: 164 mg/dL — ABNORMAL HIGH (ref 70–99)
Potassium: 4.7 mmol/L (ref 3.5–5.1)
Sodium: 123 mmol/L — ABNORMAL LOW (ref 135–145)

## 2022-07-29 LAB — CBC
HCT: 34.4 % — ABNORMAL LOW (ref 36.0–46.0)
Hemoglobin: 11.4 g/dL — ABNORMAL LOW (ref 12.0–15.0)
MCH: 26.7 pg (ref 26.0–34.0)
MCHC: 33.1 g/dL (ref 30.0–36.0)
MCV: 80.6 fL (ref 80.0–100.0)
Platelets: 110 10*3/uL — ABNORMAL LOW (ref 150–400)
RBC: 4.27 MIL/uL (ref 3.87–5.11)
RDW: 22.9 % — ABNORMAL HIGH (ref 11.5–15.5)
WBC: 7.3 10*3/uL (ref 4.0–10.5)
nRBC: 0 % (ref 0.0–0.2)

## 2022-07-29 LAB — PROTIME-INR
INR: 1.2 ratio — ABNORMAL HIGH (ref 0.8–1.0)
INR: 1.1 (ref 0.8–1.2)
Prothrombin Time: 12.6 s (ref 9.6–13.1)
Prothrombin Time: 14.2 seconds (ref 11.4–15.2)

## 2022-07-29 LAB — AMMONIA
Ammonia: 53 umol/L — ABNORMAL HIGH (ref 11–35)
Ammonia: 40 umol/L — ABNORMAL HIGH (ref 9–35)

## 2022-07-29 LAB — OSMOLALITY: Osmolality: 269 mOsm/kg — ABNORMAL LOW (ref 275–295)

## 2022-07-29 MED ORDER — ESCITALOPRAM OXALATE 10 MG PO TABS
10.0000 mg | ORAL_TABLET | Freq: Every day | ORAL | Status: DC
  Administered 2022-07-30 – 2022-08-01 (×3): 10 mg via ORAL
  Filled 2022-07-29 (×3): qty 1

## 2022-07-29 MED ORDER — ESCITALOPRAM OXALATE 10 MG PO TABS: 10.0000 mg | ORAL_TABLET | Freq: Every day | ORAL | Status: DC

## 2022-07-29 MED ORDER — SPIRONOLACTONE 25 MG PO TABS: 50.0000 mg | ORAL_TABLET | ORAL | Status: DC

## 2022-07-29 MED ORDER — LACTULOSE 10 GM/15ML PO SOLN: 20.0000 g | Freq: Every day | ORAL | Status: DC | PRN

## 2022-07-29 MED ORDER — SODIUM CHLORIDE 0.9 % IV BOLUS
500.0000 mL | Freq: Once | INTRAVENOUS | Status: AC
  Administered 2022-07-29: 500 mL via INTRAVENOUS

## 2022-07-29 MED ORDER — INSULIN GLARGINE-YFGN 100 UNIT/ML ~~LOC~~ SOLN
30.0000 [IU] | Freq: Every day | SUBCUTANEOUS | Status: DC
  Administered 2022-07-30 – 2022-07-31 (×3): 30 [IU] via SUBCUTANEOUS
  Filled 2022-07-29: qty 0.3
  Filled 2022-07-29: qty 300
  Filled 2022-07-29 (×2): qty 0.3
  Filled 2022-07-29: qty 300

## 2022-07-29 MED ORDER — POTASSIUM CHLORIDE CRYS ER 20 MEQ PO TBCR: 20.0000 meq | EXTENDED_RELEASE_TABLET | Freq: Every day | ORAL | Status: DC

## 2022-07-29 MED ORDER — IOHEXOL 300 MG/ML  SOLN
100.0000 mL | Freq: Once | INTRAMUSCULAR | Status: AC | PRN
  Administered 2022-07-29: 100 mL via INTRAVENOUS

## 2022-07-29 MED ORDER — FAMOTIDINE 20 MG PO TABS
20.0000 mg | ORAL_TABLET | Freq: Every day | ORAL | Status: DC
  Administered 2022-07-30 – 2022-07-31 (×2): 20 mg via ORAL
  Filled 2022-07-29 (×3): qty 1

## 2022-07-29 MED ORDER — SODIUM CHLORIDE 0.9 % IV SOLN
INTRAVENOUS | Status: DC
Start: 2022-07-29 — End: 2022-07-30

## 2022-07-29 MED ORDER — METFORMIN HCL 500 MG PO TABS
1000.0000 mg | ORAL_TABLET | Freq: Two times a day (BID) | ORAL | Status: DC
Start: 2022-07-30 — End: 2022-07-29

## 2022-07-29 MED ORDER — METFORMIN HCL 500 MG PO TABS: 1000.0000 mg | ORAL_TABLET | Freq: Two times a day (BID) | ORAL | Status: DC

## 2022-07-29 NOTE — ED Triage Notes (Signed)
Patient here POV from Home.  Endorses having a History of Cirrhosis and on Monday the patient was having Episodes of Confusion as well as some N/V/D. She had Blood Specimens Collected Yesterday and was informed her Sodium was 120 and to Seek ED Evaluation.   NAD noted during Triage. A&Ox4. GCS 15. Ambulatory.

## 2022-07-29 NOTE — ED Provider Notes (Signed)
[facility] EMERGENCY DEPT Provider Note   CSN: [id number] Arrival date & time: 07/29/22  1608     History  Chief Complaint  Patient presents with   Abnormal Lab    DERETHA ERTLE is a 71 y.o. female.  With past medical history of type 2 diabetes, hypertension dyslipidemia, nonalcoholic fatty liver disease with cirrhosis who presents to the emergency department for abnormal labs.  Patient states that she began feeling ill on Tuesday.  She describes having a few episodes of nausea, vomiting and diarrhea.  Patient states that the next morning she had a low-grade fever of around 100 and felt confused, lethargic and tremulous.  Felt better on Thursday but called her GI who requested lab work.  She then had labs done today around 1:00 and was found to have a sodium of 120 and instructed to present to the emergency department.  Husband at bedside states that she has had recurrent problems with elevated ammonia.  She has been taking lactulose daily but he states that he increase her lactulose to 3 times a day on Wednesday.  After her diarrhea episodes on Tuesday she has not had a bowel movement since then by increasing her lactulose.  He states that her confusion has improved slightly but she does still seem a little off.  Abnormal Lab      Home Medications Prior to Admission medications   Medication Sig Start Date End Date Taking? Authorizing Provider  BD PEN NEEDLE NANO 2ND GEN 32G X 4 MM MISC USE AS DIRECTED 04/01/22   Shamleffer, Melanie Crazier, MD  Continuous Blood Gluc Sensor (DEXCOM G7 SENSOR) MISC 1 Device by Does not apply route as directed. Every 10 days 05/09/22   Shamleffer, Melanie Crazier, MD  escitalopram (LEXAPRO) 10 MG tablet TAKE 1 TABLET BY MOUTH EVERY DAY 07/25/22   Burchette, Alinda Sierras, MD  famotidine (PEPCID) 20 MG tablet Take 20 mg by mouth at bedtime.    [provider]  furosemide (LASIX) 20 MG tablet TAKE 1 TABLET BY MOUTH EVERY DAY Patient  taking differently: Take 10 mg by mouth daily. 04/07/22   Milus Banister, MD  insulin glargine (LANTUS SOLOSTAR) 100 UNIT/ML Solostar Pen Inject 30 Units into the skin daily. 09/30/21   Shamleffer, Melanie Crazier, MD  insulin lispro (HUMALOG KWIKPEN) 200 UNIT/ML KwikPen Max Daily 120 09/30/21   Shamleffer, Melanie Crazier, MD  KLOR-CON M20 20 MEQ tablet TAKE 1 TABLET BY MOUTH EVERY DAY Patient taking differently: every other day. 02/03/22   Milus Banister, MD  lactulose [facility]) 10 GM/15ML solution TAKE 6 TABLESPOONS DAILY Patient taking differently: TAKE 3 TABLESPOONS DAILY 04/25/22   Milus Banister, MD  metFORMIN (GLUCOPHAGE) 1000 MG tablet TAKE 1 TABLET BY MOUTH TWICE A DAY 06/03/22   Shamleffer, Melanie Crazier, MD  spironolactone (ALDACTONE) 50 MG tablet Take 1 tablet (50 mg total) by mouth every other day. 03/09/22   Milus Banister, MD      Allergies    Amoxicillin-pot clavulanate, Codeine sulfate, Diflucan [fluconazole], Erythromycin base, Invokana [canagliflozin], Irbesartan-hydrochlorothiazide, Latex, Victoza [liraglutide], and Ozempic (0.25 or 0.5 mg-dose) [semaglutide(0.25 or 0.18m-dos)]    Review of Systems   Review of Systems  Constitutional:  Positive for fever.  Respiratory:  Negative for shortness of breath.   Cardiovascular:  Negative for chest pain.  Gastrointestinal:  Positive for abdominal distention, diarrhea, nausea and vomiting. Negative for abdominal pain.  Genitourinary:  Negative for dysuria.  Psychiatric/Behavioral:  Positive for confusion.   All  other systems reviewed and are negative.   Physical Exam Updated Vital Signs BP (!) 167/79 (BP Location: Right Arm)   Pulse (!) 121   Temp 98.1 F (36.7 C)   Resp 18   Ht 5' 3"  (1.6 m)   Wt 76.6 kg   SpO2 97%   BMI 29.91 kg/m  Physical Exam Vitals and nursing note reviewed.  Constitutional:      General: She is not in acute distress.    Appearance: Normal appearance. She is ill-appearing. She is not  toxic-appearing.  HENT:     Head: Normocephalic.     Mouth/Throat:     Mouth: Mucous membranes are moist.     Pharynx: Oropharynx is clear.  Eyes:     General: No scleral icterus.    Extraocular Movements: Extraocular movements intact.     Pupils: Pupils are equal, round, and reactive to light.  Cardiovascular:     Rate and Rhythm: Regular rhythm. Tachycardia present.     Pulses: Normal pulses.     Heart sounds: Murmur heard.  Pulmonary:     Effort: Pulmonary effort is normal. No respiratory distress.     Breath sounds: Normal breath sounds.  Abdominal:     General: Bowel sounds are normal. There is distension.     Palpations: Abdomen is soft.     Tenderness: There is no abdominal tenderness.  Musculoskeletal:     Cervical back: Neck supple.  Skin:    General: Skin is warm and dry.     Capillary Refill: Capillary refill takes less than 2 seconds.     Coloration: Skin is not jaundiced.  Neurological:     General: No focal deficit present.     Mental Status: She is alert. She is confused.     Motor: No tremor.     Comments: Slow to respond. When asked year she states, "I believe were in 2023." Delayed response to spelling [id number] backward.  Psychiatric:        Mood and Affect: Mood normal.        Behavior: Behavior normal.    ED Results / Procedures / Treatments   Labs (all labs ordered are listed, but only abnormal results are displayed) Labs Reviewed  COMPREHENSIVE METABOLIC PANEL - Abnormal; Notable for the following components:      Result Value   Sodium 121 (*)    Potassium 5.2 (*)    Chloride 90 (*)    CO2 21 (*)    Glucose, Bld 164 (*)    AST 75 (*)    ALT 83 (*)    Total Bilirubin 2.9 (*)    All other components within normal limits  CBC - Abnormal; Notable for the following components:   Hemoglobin 11.4 (*)    HCT 34.4 (*)    RDW 22.9 (*)    Platelets 110 (*)    All other components within normal limits  URINALYSIS, ROUTINE W REFLEX MICROSCOPIC -  Abnormal; Notable for the following components:   Specific Gravity, Urine >1.046 (*)    All other components within normal limits  AMMONIA - Abnormal; Notable for the following components:   Ammonia 40 (*)    All other components within normal limits  PROTIME-INR  DIFFERENTIAL  OSMOLALITY  BASIC METABOLIC PANEL  BASIC METABOLIC PANEL  BASIC METABOLIC PANEL  BASIC METABOLIC PANEL   EKG EKG Interpretation  Date/Time:  Friday July 29 2022 16:25:34 EDT Ventricular Rate:  116 PR Interval:  136 QRS Duration: 72  QT Interval:  336 QTC Calculation: 467 R Axis:   39 Text Interpretation: Sinus tachycardia Otherwise normal ECG No previous ECGs available Confirmed by Octaviano Glow [phone]) on 07/29/2022 7:06:39 PM  Radiology CT Abdomen Pelvis W Contrast  Result Date: 07/29/2022 CLINICAL DATA:  Hyponatremia, cirrhosis EXAM: CT ABDOMEN AND PELVIS WITH CONTRAST TECHNIQUE: Multidetector CT imaging of the abdomen and pelvis was performed using the standard protocol following bolus administration of intravenous contrast. RADIATION DOSE REDUCTION: This exam was performed according to the departmental dose-optimization program which includes automated exposure control, adjustment of the mA and/or kV according to patient size and/or use of iterative reconstruction technique. CONTRAST:  122m OMNIPAQUE IOHEXOL 300 MG/ML  SOLN COMPARISON:  Right upper quadrant ultrasound dated 06/09/2022. FINDINGS: Lower chest: Subpleural reticulation at the lung bases. Hepatobiliary: Cirrhosis.  No focal hepatic lesion is seen. Gallbladder is decompressed. No intrahepatic or extrahepatic ductal dilatation. Pancreas: Multiple cystic lesions in the uncinate process, measuring up to 4.1 cm (series 2/image 32). No pancreatic ductal dilatation. Spleen: Mild splenomegaly. Adrenals/Urinary Tract: Adrenal glands are within normal limits. Kidneys are within normal limits.  No hydronephrosis. Bladder is underdistended and  unremarkable. Stomach/Bowel: Stomach is within normal limits. No evidence of bowel obstruction. Appendix is not discretely visualized. No colonic wall thickening or inflammatory changes. Vascular/Lymphatic: No evidence of abdominal aortic aneurysm. Portal vein is patent. Gastroesophageal varices with splenorenal shunt. Reproductive: Status post hysterectomy. No adnexal masses. Other: Small volume perihepatic ascites. Small periumbilical hernia with trace fluid (series 2/image 56). Musculoskeletal: Visualized osseous structures are within normal limits. IMPRESSION: Cirrhosis. No focal hepatic lesion is seen. Portal vein is patent. Gastroesophageal varices with splenorenal shunt. Small volume perihepatic ascites. Mild splenomegaly. Multiple cystic lesions in the uncinate process, measuring up to 4.1 cm, favoring side branch IPMNs. Given size greater than 3 cm, EUS with cyst aspiration is suggested. Electronically Signed   By: SJulian HyM.D.   On: 07/29/2022 18:51    Procedures Procedures   Medications Ordered in ED Medications  sodium chloride 0.9 % bolus 500 mL (has no administration in time range)  iohexol (OMNIPAQUE) 300 MG/ML solution 100 mL (100 mLs Intravenous Contrast Given 07/29/22 1833)    ED Course/ Medical Decision Making/ A&P                           Medical Decision Making Amount and/or Complexity of Data Reviewed Labs: ordered. Radiology: ordered.  Risk Prescription drug management. Decision regarding hospitalization.  This patient presents to the ED with chief complaint(s) of hyponatremia with pertinent past medical history of cirrhosis which further complicates the presenting complaint. The complaint involves an extensive differential diagnosis and treatment options and also carries with it a high risk of complications and morbidity.    The differential diagnosis includes SIADH, fluid volume overload, CHF, renal failure, salt wasting, etc.  Additional history  obtained: Additional history obtained from spouse Records reviewed Care Everywhere/External Records  ED Course: Lab Tests: I Ordered, and personally interpreted labs.  The pertinent results include:   CBC with no leukocytosis.  Hemoglobin 11.4 and platelets 110, stable CMP with sodium of 121, potassium of 5.2, mild transaminitis which is stable Ammonia 40 UA negative Imaging Studies: I ordered and independently visualized and interpreted the following imaging CT scan abdomen pelvis and X-ray chest   which showed CT abdomen pelvis shows cirrhosis, mild perihepatic ascites.  X-ray of the chest is pending The interpretation of the imaging was limited to  assessing for emergent pathology, for which purpose it was ordered. Cardiac Monitoring: The patient was maintained on a cardiac monitor.  I personally viewed and interpreted the cardiac monitor which showed an underlying rhythm of:  sinus tachycardia Medicines ordered and prescription drug management: I ordered the following medications 500 mL normal saline for hyponatremia I considered this additional medications: N/A Reevaluation of the patient after these medicines showed that the patient     pending  Reassessment and review 71 year old female with past medical history of cirrhosis who presents to the emergency department with about 4 days of symptoms.  Initially febrile at home with nausea, diarrhea.  She then had some confusion per the husband. Found to have hyponatremia to 120 in the outpatient setting and was sent here to the emergency department for ongoing management. She is somewhat slowed but not delirious, tremulous or unstable. Labs obtained which showed sodium at 121.  Ammonia is only 40.  She does have some abdominal distention which she states is new for her. Obtain a CT abdomen pelvis to evaluate for bowel obstruction since she states she has not had a bowel movement since Tuesday even though she has been taking lactulose 3  times daily.  CT abdomen pelvis did not show any bowel obstruction.  She only has minor perihepatic ascites. UA is unremarkable.  The rest of her labs are inconsistent with overtly being dehydrated as a source of her hyponatremia.  No recent head trauma.  No renal dysfunction.  Her hyponatremia is likely secondary to her cirrhosis.  We will give her an initial 500 mL bolus and admit her. Her symptoms earlier in the week may have been a viral gastroenteritis as she is asymptomatic now.  I consulted and spoke with Dr. Lyna Poser, hospitalist who agrees to admit the patient.  He does state that she will likely be in ED overnight so he is requesting a BMP every 4 hours including now.  After 500 mL fluid bolus will start 75 mL/hr saline infusion and monitoring her sodium until she is able to be transferred.  He is also requesting a chest x-ray given her history of fever this week.  This is all been ordered.  The patient agrees to admission.  Consultations Obtained: I requested consultation with the admitting physician Dr. Cyd Silence , and discussed  findings as well as pertinent plan - they recommend: BMP every 4 hours.  He is requesting a new BMP now and after 500 mL saline bolus to start the patient on normal saline at 75 mL/hr.  Also requests chest x-ray for recent history of fever which has been ordered   Complexity of problems addressed: Patient's presentation is most consistent with  severe exacerbation of chronic illness During patient's assessment  Disposition: After consideration of the diagnostic results and the patient's response to treatment,  I feel that the patent would benefit from admission to hospitalist .  Social Determinants of Health: Patient's  none identified   increases the complexity of managing their presentation  Final Clinical Impression(s) / ED Diagnoses Final diagnoses:  Hyponatremia    Rx / DC Orders ED Discharge Orders     None         Mickie Hillier,  PA-C 07/29/22 2104    Wyvonnia Dusky, MD 07/29/22 [phone]

## 2022-07-29 NOTE — Progress Notes (Signed)
Plan of Care Note for accepted transfer   Patient: Dana Dyer MRN: [id number]   DOA: 07/29/2022  Facility requesting transfer: [facility] Requesting Provider: PA Theodis Blaze Reason for transfer: Hyponatremia Facility course:   71 year old female with PMHx non alcoholic Cirrhosis.  Presenting with malaise, N/V/D.  Last episode of diarrhea Tuesday.    Had low grade temp of 100F on Wednesday.  Husband was worried ammonia was up and increased her lactulose (but no increased stool output resulted).  Thursday, they called her GI who ordered outpatient lab work - Na found to be 120 so GI doc sent her to [facility] ED.    Baseline Na is 130-134.  In ED, patient found to be lethargic but oriented.  Ammonia 40.  CT abd/pelvis shows no acute pathology with only small volume ascites.  No abdominal tenderness on exam.  UA clean with elevated specific gravity.    Advised serial chemistries and if Na has no risen substantially on repeat to start low rate [facility].  Advised getting a CXR.  Bed request placed for med tele bed, first available at [facility].  Plan of care: The patient is accepted for admission to Telemetry unit, at [facility]..    Author: Vernelle Emerald, MD 07/29/2022  Check [email or web address] for on-call coverage.  Nursing staff, Please call [facility] number on Amion as soon as patient's arrival, so appropriate admitting provider can evaluate the pt.

## 2022-07-30 DIAGNOSIS — E860 Dehydration: Secondary | ICD-10-CM | POA: Diagnosis present

## 2022-07-30 DIAGNOSIS — Z85828 Personal history of other malignant neoplasm of skin: Secondary | ICD-10-CM | POA: Diagnosis not present

## 2022-07-30 DIAGNOSIS — E871 Hypo-osmolality and hyponatremia: Secondary | ICD-10-CM | POA: Diagnosis present

## 2022-07-30 DIAGNOSIS — E861 Hypovolemia: Secondary | ICD-10-CM | POA: Diagnosis present

## 2022-07-30 DIAGNOSIS — D638 Anemia in other chronic diseases classified elsewhere: Secondary | ICD-10-CM | POA: Diagnosis present

## 2022-07-30 DIAGNOSIS — F419 Anxiety disorder, unspecified: Secondary | ICD-10-CM | POA: Diagnosis present

## 2022-07-30 DIAGNOSIS — Z8249 Family history of ischemic heart disease and other diseases of the circulatory system: Secondary | ICD-10-CM | POA: Diagnosis not present

## 2022-07-30 DIAGNOSIS — Z833 Family history of diabetes mellitus: Secondary | ICD-10-CM | POA: Diagnosis not present

## 2022-07-30 DIAGNOSIS — Z91040 Latex allergy status: Secondary | ICD-10-CM | POA: Diagnosis not present

## 2022-07-30 DIAGNOSIS — I10 Essential (primary) hypertension: Secondary | ICD-10-CM | POA: Diagnosis present

## 2022-07-30 DIAGNOSIS — K760 Fatty (change of) liver, not elsewhere classified: Secondary | ICD-10-CM | POA: Diagnosis present

## 2022-07-30 DIAGNOSIS — Z90710 Acquired absence of both cervix and uterus: Secondary | ICD-10-CM | POA: Diagnosis not present

## 2022-07-30 DIAGNOSIS — Z79899 Other long term (current) drug therapy: Secondary | ICD-10-CM | POA: Diagnosis not present

## 2022-07-30 DIAGNOSIS — Z885 Allergy status to narcotic agent status: Secondary | ICD-10-CM | POA: Diagnosis not present

## 2022-07-30 DIAGNOSIS — E1165 Type 2 diabetes mellitus with hyperglycemia: Secondary | ICD-10-CM | POA: Diagnosis present

## 2022-07-30 DIAGNOSIS — M797 Fibromyalgia: Secondary | ICD-10-CM | POA: Diagnosis present

## 2022-07-30 DIAGNOSIS — D6959 Other secondary thrombocytopenia: Secondary | ICD-10-CM | POA: Diagnosis present

## 2022-07-30 DIAGNOSIS — Z803 Family history of malignant neoplasm of breast: Secondary | ICD-10-CM | POA: Diagnosis not present

## 2022-07-30 DIAGNOSIS — E785 Hyperlipidemia, unspecified: Secondary | ICD-10-CM | POA: Diagnosis present

## 2022-07-30 DIAGNOSIS — Z880 Allergy status to penicillin: Secondary | ICD-10-CM | POA: Diagnosis not present

## 2022-07-30 DIAGNOSIS — F32A Depression, unspecified: Secondary | ICD-10-CM | POA: Diagnosis present

## 2022-07-30 DIAGNOSIS — Z794 Long term (current) use of insulin: Secondary | ICD-10-CM | POA: Diagnosis not present

## 2022-07-30 DIAGNOSIS — K219 Gastro-esophageal reflux disease without esophagitis: Secondary | ICD-10-CM | POA: Diagnosis present

## 2022-07-30 DIAGNOSIS — K7460 Unspecified cirrhosis of liver: Secondary | ICD-10-CM | POA: Diagnosis present

## 2022-07-30 LAB — BASIC METABOLIC PANEL
Anion gap: 6 (ref 5–15)
Anion gap: 7 (ref 5–15)
Anion gap: 7 (ref 5–15)
Anion gap: 7 (ref 5–15)
BUN: 15 mg/dL (ref 8–23)
BUN: 16 mg/dL (ref 8–23)
BUN: 18 mg/dL (ref 8–23)
BUN: 17 mg/dL (ref 8–23)
CO2: 23 mmol/L (ref 22–32)
CO2: 22 mmol/L (ref 22–32)
CO2: 22 mmol/L (ref 22–32)
CO2: 22 mmol/L (ref 22–32)
Calcium: 9.3 mg/dL (ref 8.9–10.3)
Calcium: 8.9 mg/dL (ref 8.9–10.3)
Calcium: 9.2 mg/dL (ref 8.9–10.3)
Calcium: 8.9 mg/dL (ref 8.9–10.3)
Chloride: 93 mmol/L — ABNORMAL LOW (ref 98–111)
Chloride: 96 mmol/L — ABNORMAL LOW (ref 98–111)
Chloride: 99 mmol/L (ref 98–111)
Chloride: 98 mmol/L (ref 98–111)
Creatinine, Ser: 0.89 mg/dL (ref 0.44–1.00)
Creatinine, Ser: 0.85 mg/dL (ref 0.44–1.00)
Creatinine, Ser: 0.71 mg/dL (ref 0.44–1.00)
Creatinine, Ser: 0.83 mg/dL (ref 0.44–1.00)
GFR, Estimated: >60 mL/min (ref >60)
GFR, Estimated: >60 mL/min (ref >60)
GFR, Estimated: >60 mL/min (ref >60)
GFR, Estimated: >60 mL/min (ref >60)
Glucose, Bld: 213 mg/dL — ABNORMAL HIGH (ref 70–99)
Glucose, Bld: 267 mg/dL — ABNORMAL HIGH (ref 70–99)
Glucose, Bld: 121 mg/dL — ABNORMAL HIGH (ref 70–99)
Glucose, Bld: 164 mg/dL — ABNORMAL HIGH (ref 70–99)
Potassium: 4.9 mmol/L (ref 3.5–5.1)
Potassium: 4.5 mmol/L (ref 3.5–5.1)
Potassium: 4.5 mmol/L (ref 3.5–5.1)
Potassium: 4.7 mmol/L (ref 3.5–5.1)
Sodium: 122 mmol/L — ABNORMAL LOW (ref 135–145)
Sodium: 125 mmol/L — ABNORMAL LOW (ref 135–145)
Sodium: 128 mmol/L — ABNORMAL LOW (ref 135–145)
Sodium: 127 mmol/L — ABNORMAL LOW (ref 135–145)

## 2022-07-30 LAB — CBC
HCT: 32.7 % — ABNORMAL LOW (ref 36.0–46.0)
Hemoglobin: 10.9 g/dL — ABNORMAL LOW (ref 12.0–15.0)
MCH: 27.9 pg (ref 26.0–34.0)
MCHC: 33.3 g/dL (ref 30.0–36.0)
MCV: 83.8 fL (ref 80.0–100.0)
Platelets: 88 10*3/uL — ABNORMAL LOW (ref 150–400)
RBC: 3.9 MIL/uL (ref 3.87–5.11)
RDW: 23.2 % — ABNORMAL HIGH (ref 11.5–15.5)
WBC: 5 10*3/uL (ref 4.0–10.5)
nRBC: 0 % (ref 0.0–0.2)

## 2022-07-30 LAB — GLUCOSE, CAPILLARY
Glucose-Capillary: 292 mg/dL — ABNORMAL HIGH (ref 70–99)
Glucose-Capillary: 118 mg/dL — ABNORMAL HIGH (ref 70–99)
Glucose-Capillary: 164 mg/dL — ABNORMAL HIGH (ref 70–99)
Glucose-Capillary: 195 mg/dL — ABNORMAL HIGH (ref 70–99)

## 2022-07-30 LAB — CBG MONITORING, ED: Glucose-Capillary: 227 mg/dL — ABNORMAL HIGH (ref 70–99)

## 2022-07-30 MED ORDER — ENOXAPARIN SODIUM 40 MG/0.4ML IJ SOSY
40.0000 mg | PREFILLED_SYRINGE | INTRAMUSCULAR | Status: DC
  Administered 2022-07-30: 40 mg via SUBCUTANEOUS
  Filled 2022-07-30: qty 0.4

## 2022-07-30 MED ORDER — SODIUM CHLORIDE 0.9 % IV SOLN: INTRAVENOUS | Status: DC

## 2022-07-30 MED ORDER — INSULIN ASPART 100 UNIT/ML IJ SOLN: 0.0000 [IU] | Freq: Every day | INTRAMUSCULAR | Status: DC

## 2022-07-30 MED ORDER — LACTULOSE 10 GM/15ML PO SOLN
20.0000 g | Freq: Three times a day (TID) | ORAL | Status: DC
Start: 2022-07-30 — End: 2022-08-01
  Administered 2022-07-30 – 2022-08-01 (×7): 20 g via ORAL
  Filled 2022-07-30 (×7): qty 30

## 2022-07-30 MED ORDER — INSULIN ASPART 100 UNIT/ML IJ SOLN
0.0000 [IU] | Freq: Three times a day (TID) | INTRAMUSCULAR | Status: DC
  Administered 2022-07-30: 2 [IU] via SUBCUTANEOUS
  Administered 2022-07-30: 5 [IU] via SUBCUTANEOUS
  Administered 2022-07-31 (×2): 2 [IU] via SUBCUTANEOUS
  Administered 2022-08-01: 1 [IU] via SUBCUTANEOUS

## 2022-07-30 MED ORDER — LIP MEDEX EX OINT
TOPICAL_OINTMENT | CUTANEOUS | Status: DC | PRN
  Filled 2022-07-30: qty 7

## 2022-07-30 NOTE — H&P (Signed)
History and Physical  Dana Dyer CWC:[id number] DOB: [birth year] DOA: 07/29/2022  Referring physician: Accepted by Dr. Cyd Silence, [facility], Hospitalist service.  PCP: Eulas Post, MD  Outpatient Specialists: GI. Patient coming from: Home through [place] ED  Chief Complaint: Abnormal labs, hyponatremia.  HPI: Dana Dyer is a 71 y.o. female with medical history significant for nonalcoholic fatty liver disease, cirrhosis, type 2 diabetes, hypertension, obstructive sleep apnea, iron deficiency anemia, hyperlipidemia, chronic anxiety/depression, who initially presented to [place] ED at the recommendation of her gastroenterologist due to abnormal labs.  Reports nausea vomiting and confusion 5 days ago.  Family thought her ammonia level was elevated during that time which prompted her visit to her gastroenterologist.  She had blood drawn.  Lab results were concerning for hyponatremia with serum sodium of 120.  GI recommended ED evaluation.  The patient received gentle IV fluid hydration, NS, in the ED with mild improvement of her sodium level.  EDP requested admission for further evaluation.  Patient was accepted by Dr. Cyd Silence, [facility] and transferred to [facility] long hospital telemetry unit.  ED Course: Tmax 98.3.  BP 146/77, pulse 108, respiratory rate 18, O2 saturation 97% on room air.  Lab studies remarkable for serum sodium 120, chloride 89, glucose 210, AST 58, ALT 77, ammonia 53, total bilirubin 2.7, GFR 58.  Hemoglobin 11.4, platelet count 110.  Review of Systems: Review of systems as noted in the HPI. All other systems reviewed and are negative.   Past Medical History:  Diagnosis Date   ALLERGIC RHINITIS 10/09/2007   Allergy    Arthritis    hips, spine   DIABETES MELLITUS, UNCONTROLLED 03/16/2009   FATIGUE 10/21/2008   FIBROMYALGIA 10/09/2007   GERD (gastroesophageal reflux disease)    diet  controlled, no meds   H/O vaginal hysterectomy    HYPERLIPIDEMIA 10/09/2007    diet controlled, no meds   HYPERTENSION 10/09/2007   Morton's neuroma    OBSTRUCTIVE SLEEP APNEA 10/09/2007   Palpitations    Plantar fasciitis    History - right   Sleep apnea    Uses CPAP   TRANSAMINASES, SERUM, ELEVATED 07/08/2009   Past Surgical History:  Procedure Laterality Date   ABDOMINAL HYSTERECTOMY  1979   prolapsed uterus   BASAL CELL CARCINOMA EXCISION     BREAST BIOPSY Left 2012   left 2012 stereo   BREAST REDUCTION SURGERY Bilateral 1992   COLONOSCOPY  2006   [place]   mamoplasty     reduction   REDUCTION MAMMAPLASTY Bilateral [place]    Social History:  reports that she has never smoked. She has never used smokeless tobacco. She reports that she does not drink alcohol and does not use drugs.   Allergies  Allergen Reactions   Amoxicillin-Pot Clavulanate     Severe diarrhea   Codeine Sulfate     REACTION: "hyper"   Diflucan [Fluconazole] Hives   Erythromycin Base    Invokana [Canagliflozin] Other (See Comments)    Recurrent yeast infections   Irbesartan-Hydrochlorothiazide    Latex    Victoza [Liraglutide] Nausea Only   Ozempic (0.25 Or 0.5 Mg-Dose) [Semaglutide(0.25 Or 0.36m-Dos)] Nausea Only    Family History  Problem Relation Age of Onset   Diabetes Mother    Arthritis Mother    Cancer Mother        breast   Breast cancer Mother [phone]  Alcohol abuse Father    Arthritis Father  Hypertension Maternal Grandfather    Miscarriages / Stillbirths Paternal Grandfather    Breast cancer Maternal Aunt        diagnosed in her 69's   Colon polyps Neg Hx    Colon cancer Neg Hx    Esophageal cancer Neg Hx    Rectal cancer Neg Hx    Stomach cancer Neg Hx       Prior to Admission medications   Medication Sig Start Date End Date Taking? Authorizing Provider  BD PEN NEEDLE NANO 2ND GEN 32G X 4 MM MISC USE AS DIRECTED 04/01/22   Shamleffer, Melanie Crazier, MD  Continuous Blood Gluc Sensor (DEXCOM G7 SENSOR)  MISC 1 Device by Does not apply route as directed. Every 10 days 05/09/22   Shamleffer, Melanie Crazier, MD  escitalopram (LEXAPRO) 10 MG tablet TAKE 1 TABLET BY MOUTH EVERY DAY 07/25/22   Burchette, Alinda Sierras, MD  famotidine (PEPCID) 20 MG tablet Take 20 mg by mouth at bedtime.    [provider]  furosemide (LASIX) 20 MG tablet TAKE 1 TABLET BY MOUTH EVERY DAY Patient taking differently: Take 10 mg by mouth daily. 04/07/22   Milus Banister, MD  insulin glargine (LANTUS SOLOSTAR) 100 UNIT/ML Solostar Pen Inject 30 Units into the skin daily. 09/30/21   Shamleffer, Melanie Crazier, MD  insulin lispro (HUMALOG KWIKPEN) 200 UNIT/ML KwikPen Max Daily 120 09/30/21   Shamleffer, Melanie Crazier, MD  KLOR-CON M20 20 MEQ tablet TAKE 1 TABLET BY MOUTH EVERY DAY Patient taking differently: every other day. 02/03/22   Milus Banister, MD  lactulose [facility]) 10 GM/15ML solution TAKE 6 TABLESPOONS DAILY Patient taking differently: TAKE 3 TABLESPOONS DAILY 04/25/22   Milus Banister, MD  metFORMIN (GLUCOPHAGE) 1000 MG tablet TAKE 1 TABLET BY MOUTH TWICE A DAY 06/03/22   Shamleffer, Melanie Crazier, MD  spironolactone (ALDACTONE) 50 MG tablet Take 1 tablet (50 mg total) by mouth every other day. 03/09/22   Milus Banister, MD    Physical Exam: BP (!) 146/77 (BP Location: Right Arm)   Pulse (!) 108   Temp 98.3 F (36.8 C) (Oral)   Resp 18   Ht 5' 3"  (1.6 m)   Wt 76.6 kg   SpO2 97%   BMI 29.91 kg/m   General: 71 y.o. year-old female well developed well nourished in no acute distress.  Alert and oriented x3. Cardiovascular: Regular rate and rhythm with no rubs or gallops.  No thyromegaly or JVD noted.  No lower extremity edema. 2/4 pulses in all 4 extremities. Respiratory: Clear to auscultation with no wheezes or rales. Good inspiratory effort. Abdomen: Soft nontender mildly distended with normal bowel sounds x4 quadrants. Muskuloskeletal: No cyanosis, clubbing or edema noted bilaterally Neuro: CN  II-XII intact, strength, sensation, reflexes Skin: No ulcerative lesions noted or rashes Psychiatry: Judgement and insight appear normal. Mood is appropriate for condition and setting          Labs on Admission:  Basic Metabolic Panel: Recent Labs  Lab 07/29/22 1350 07/29/22 1624 07/29/22 2205 07/30/22 0129  NA 120* 121* 123* 122*  K 4.9 5.2* 4.7 4.9  CL 89* 90* 92* 93*  CO2 25 21* 22 23  GLUCOSE 210* 164* 164* 213*  BUN 16 17 16 15   CREATININE 0.96 0.95 0.89 0.89  CALCIUM 9.1 9.9 9.3 9.3   Liver Function Tests: Recent Labs  Lab 07/29/22 1350 07/29/22 1624  AST 58* 75*  ALT 77* 83*  ALKPHOS 89 84  BILITOT 2.7* 2.9*  PROT 6.6 7.2  ALBUMIN 3.7 3.9   No results for input(s): "LIPASE", "AMYLASE" in the last 168 hours. Recent Labs  Lab 07/29/22 1350 07/29/22 1920  AMMONIA 53* 40*   CBC: Recent Labs  Lab 07/29/22 1350 07/29/22 1624  WBC 4.5 7.3  NEUTROABS 3.1 4.7  HGB 11.3* 11.4*  HCT 33.7* 34.4*  MCV 81.5 80.6  PLT 99.0* 110*   Cardiac Enzymes: No results for input(s): "CKTOTAL", "CKMB", "CKMBINDEX", "TROPONINI" in the last 168 hours.  BNP (last 3 results) No results for input(s): "BNP" in the last 8760 hours.  ProBNP (last 3 results) No results for input(s): "PROBNP" in the last 8760 hours.  CBG: Recent Labs  Lab 07/30/22 0122  GLUCAP 227*    Radiological Exams on Admission: DG Chest Port 1 View  Result Date: 07/29/2022 CLINICAL DATA:  History of cirrhosis confusion EXAM: PORTABLE CHEST 1 VIEW COMPARISON:  01/12/2022 FINDINGS: Minimal atelectasis or scarring at left base. No consolidation or sizable effusion. Mildly coarse chronic interstitial opacity. Normal cardiac size. No pneumothorax IMPRESSION: Minimal atelectasis or scar at the left base Electronically Signed   By: Donavan Foil M.D.   On: 07/29/2022 21:16   CT Abdomen Pelvis W Contrast  Result Date: 07/29/2022 CLINICAL DATA:  Hyponatremia, cirrhosis EXAM: CT ABDOMEN AND PELVIS WITH  CONTRAST TECHNIQUE: Multidetector CT imaging of the abdomen and pelvis was performed using the standard protocol following bolus administration of intravenous contrast. RADIATION DOSE REDUCTION: This exam was performed according to the departmental dose-optimization program which includes automated exposure control, adjustment of the mA and/or kV according to patient size and/or use of iterative reconstruction technique. CONTRAST:  177m OMNIPAQUE IOHEXOL 300 MG/ML  SOLN COMPARISON:  Right upper quadrant ultrasound dated 06/09/2022. FINDINGS: Lower chest: Subpleural reticulation at the lung bases. Hepatobiliary: Cirrhosis.  No focal hepatic lesion is seen. Gallbladder is decompressed. No intrahepatic or extrahepatic ductal dilatation. Pancreas: Multiple cystic lesions in the uncinate process, measuring up to 4.1 cm (series 2/image 32). No pancreatic ductal dilatation. Spleen: Mild splenomegaly. Adrenals/Urinary Tract: Adrenal glands are within normal limits. Kidneys are within normal limits.  No hydronephrosis. Bladder is underdistended and unremarkable. Stomach/Bowel: Stomach is within normal limits. No evidence of bowel obstruction. Appendix is not discretely visualized. No colonic wall thickening or inflammatory changes. Vascular/Lymphatic: No evidence of abdominal aortic aneurysm. Portal vein is patent. Gastroesophageal varices with splenorenal shunt. Reproductive: Status post hysterectomy. No adnexal masses. Other: Small volume perihepatic ascites. Small periumbilical hernia with trace fluid (series 2/image 56). Musculoskeletal: Visualized osseous structures are within normal limits. IMPRESSION: Cirrhosis. No focal hepatic lesion is seen. Portal vein is patent. Gastroesophageal varices with splenorenal shunt. Small volume perihepatic ascites. Mild splenomegaly. Multiple cystic lesions in the uncinate process, measuring up to 4.1 cm, favoring side branch IPMNs. Given size greater than 3 cm, EUS with cyst  aspiration is suggested. Electronically Signed   By: SJulian HyM.D.   On: 07/29/2022 18:51    EKG: I independently viewed the EKG done and my findings are as followed: Sinus tachycardia rate of 116.  Nonspecific ST-T changes.  QTc 467.  Assessment/Plan Present on Admission:  Dehydration with hyponatremia  Principal Problem:   Dehydration with hyponatremia  Hypovolemic hyponatremia. Presented with serum sodium 120 Hypovolemic on exam received IV fluid in the ED, completed. Serum sodium slightly up trended 123, then downtrended to 122. Last TSH 2.736 on 06/28/2022.  Nonalcoholic fatty liver disease, cirrhosis Follows with GI outpatient, caution with IV fluid hydration Management per GI Consult  GI in the morning Resume home regimen.  Type 2 diabetes with hyperglycemia Last hemoglobin A1c 6.8 on 06/24/2022. Start insulin sliding scale.  GERD Resume home Pepcid  Chronic anxiety/depression Resume home regimen     DVT prophylaxis: Subcu Lovenox daily  Code Status: Full code  Family Communication: None at bedside  Disposition Plan: Admitted to telemetry unit  Consults called: None.  Admission status: Inpatient status.   Status is: Inpatient The patient requires at least 2 midnights for further evaluation and treatment of present condition.   Kayleen Memos MD [facility] Pager [phone]  If 7PM-7AM, please contact night-coverage [email or web address] Password [email or web address]  07/30/2022, 4:00 AM

## 2022-07-30 NOTE — Progress Notes (Signed)
Pt has declined CPAP QHS.  

## 2022-07-30 NOTE — Progress Notes (Signed)
Patient admitted early this morning for dehydration with hyponatremia with serum sodium of 120.  Patient seen and examined at bedside and plan of care discussed with her.  I have reviewed patient's medical records including this morning's H&P, current vitals, labs and medications myself.  Latest sodium is 125.  Continue monitoring BMP.  Start normal saline at 75 cc an hour.

## 2022-07-31 DIAGNOSIS — E871 Hypo-osmolality and hyponatremia: Secondary | ICD-10-CM | POA: Diagnosis not present

## 2022-07-31 DIAGNOSIS — E860 Dehydration: Secondary | ICD-10-CM | POA: Diagnosis not present

## 2022-07-31 LAB — GLUCOSE, CAPILLARY
Glucose-Capillary: 95 mg/dL (ref 70–99)
Glucose-Capillary: 182 mg/dL — ABNORMAL HIGH (ref 70–99)
Glucose-Capillary: 196 mg/dL — ABNORMAL HIGH (ref 70–99)
Glucose-Capillary: 149 mg/dL — ABNORMAL HIGH (ref 70–99)

## 2022-07-31 LAB — CBC WITH DIFFERENTIAL/PLATELET
Abs Immature Granulocytes: 0.03 10*3/uL (ref 0.00–0.07)
Basophils Absolute: 0 10*3/uL (ref 0.0–0.1)
Basophils Relative: 1 %
Eosinophils Absolute: 0.1 10*3/uL (ref 0.0–0.5)
Eosinophils Relative: 3 %
HCT: 31.6 % — ABNORMAL LOW (ref 36.0–46.0)
Hemoglobin: 10.6 g/dL — ABNORMAL LOW (ref 12.0–15.0)
Immature Granulocytes: 1 %
Lymphocytes Relative: 15 %
Lymphs Abs: 0.6 10*3/uL — ABNORMAL LOW (ref 0.7–4.0)
MCH: 27.8 pg (ref 26.0–34.0)
MCHC: 33.5 g/dL (ref 30.0–36.0)
MCV: 82.9 fL (ref 80.0–100.0)
Monocytes Absolute: 0.5 10*3/uL (ref 0.1–1.0)
Monocytes Relative: 12 %
Neutro Abs: 2.9 10*3/uL (ref 1.7–7.7)
Neutrophils Relative %: 68 %
Platelets: 79 10*3/uL — ABNORMAL LOW (ref 150–400)
RBC: 3.81 MIL/uL — ABNORMAL LOW (ref 3.87–5.11)
RDW: 23.4 % — ABNORMAL HIGH (ref 11.5–15.5)
WBC: 4.1 10*3/uL (ref 4.0–10.5)
nRBC: 0 % (ref 0.0–0.2)

## 2022-07-31 LAB — COMPREHENSIVE METABOLIC PANEL
ALT: 75 U/L — ABNORMAL HIGH (ref 0–44)
AST: 58 U/L — ABNORMAL HIGH (ref 15–41)
Albumin: 3.1 g/dL — ABNORMAL LOW (ref 3.5–5.0)
Alkaline Phosphatase: 83 U/L (ref 38–126)
Anion gap: 8 (ref 5–15)
BUN: 16 mg/dL (ref 8–23)
CO2: 22 mmol/L (ref 22–32)
Calcium: 8.7 mg/dL — ABNORMAL LOW (ref 8.9–10.3)
Chloride: 98 mmol/L (ref 98–111)
Creatinine, Ser: 0.72 mg/dL (ref 0.44–1.00)
GFR, Estimated: >60 mL/min (ref >60)
Glucose, Bld: 186 mg/dL — ABNORMAL HIGH (ref 70–99)
Potassium: 4.4 mmol/L (ref 3.5–5.1)
Sodium: 128 mmol/L — ABNORMAL LOW (ref 135–145)
Total Bilirubin: 2.8 mg/dL — ABNORMAL HIGH (ref 0.3–1.2)
Total Protein: 6.2 g/dL — ABNORMAL LOW (ref 6.5–8.1)

## 2022-07-31 LAB — BASIC METABOLIC PANEL
Anion gap: 7 (ref 5–15)
BUN: 15 mg/dL (ref 8–23)
CO2: 23 mmol/L (ref 22–32)
Calcium: 8.9 mg/dL (ref 8.9–10.3)
Chloride: 101 mmol/L (ref 98–111)
Creatinine, Ser: 0.72 mg/dL (ref 0.44–1.00)
GFR, Estimated: >60 mL/min (ref >60)
Glucose, Bld: 222 mg/dL — ABNORMAL HIGH (ref 70–99)
Potassium: 4.5 mmol/L (ref 3.5–5.1)
Sodium: 131 mmol/L — ABNORMAL LOW (ref 135–145)

## 2022-07-31 LAB — MAGNESIUM: Magnesium: 1.8 mg/dL (ref 1.7–2.4)

## 2022-07-31 NOTE — Plan of Care (Signed)
  Problem: Clinical Measurements: Goal: Ability to maintain clinical measurements within normal limits will improve Outcome: Progressing Goal: Diagnostic test results will improve Outcome: Progressing   Problem: Activity: Goal: Risk for activity intolerance will decrease Outcome: Progressing

## 2022-07-31 NOTE — Evaluation (Signed)
Physical Therapy Evaluation-1x Patient Details Name: Dana Dyer MRN: [id number] DOB: [birth year] Today's Date: 07/31/2022  History of Present Illness  71 yo female admitted with dehydration with hyponatremia. Hx of liver disease, cirrhosis, DM, OSA, anemia, anxiety  Clinical Impression  On eval, pt was Mod Ind with mobility. She walked ~800 feet around the unit. No LOB with ambulation. Pt tolerated distance well. No acute PT needs. 1x eval. Will sign off.        Recommendations for follow up therapy are one component of a multi-disciplinary discharge planning process, led by the attending physician.  Recommendations may be updated based on patient status, additional functional criteria and insurance authorization.  Follow Up Recommendations No PT follow up      Assistance Recommended at Discharge None  Patient can return home with the following       Equipment Recommendations None recommended by PT  Recommendations for Other Services       Functional Status Assessment Patient has not had a recent decline in their functional status     Precautions / Restrictions Restrictions Weight Bearing Restrictions: No      Mobility  Bed Mobility               General bed mobility comments: oob    Transfers Overall transfer level: Modified independent                      Ambulation/Gait Ambulation/Gait assistance: Modified independent (Device/Increase time) Gait Distance (Feet): 800 Feet Assistive device: None Gait Pattern/deviations: WFL(Within Functional Limits)          Stairs            Wheelchair Mobility    Modified Rankin (Stroke Patients Only)       Balance Overall balance assessment: Modified Independent                                           Pertinent Vitals/Pain Pain Assessment Pain Assessment: No/denies pain    Home Living Family/patient expects to be discharged to:: Private residence Living  Arrangements: Spouse/significant other Available Help at Discharge: Family Type of Home: House           Home Equipment: None      Prior Function Prior Level of Function : Independent/Modified Independent                     Hand Dominance        Extremity/Trunk Assessment   Upper Extremity Assessment Upper Extremity Assessment: Overall WFL for tasks assessed    Lower Extremity Assessment Lower Extremity Assessment: Overall WFL for tasks assessed    Cervical / Trunk Assessment Cervical / Trunk Assessment: Normal  Communication   Communication: No difficulties  Cognition Arousal/Alertness: Awake/alert Behavior During Therapy: WFL for tasks assessed/performed Overall Cognitive Status: Within Functional Limits for tasks assessed                                          General Comments      Exercises     Assessment/Plan    PT Assessment Patient does not need any further PT services  PT Problem List         PT Treatment Interventions  PT Goals (Current goals can be found in the Care Plan section)  Acute Rehab PT Goals PT Goal Formulation: All assessment and education complete, DC therapy    Frequency       Co-evaluation               AM-PAC PT "6 Clicks" Mobility  Outcome Measure Help needed turning from your back to your side while in a flat bed without using bedrails?: None Help needed moving from lying on your back to sitting on the side of a flat bed without using bedrails?: None Help needed moving to and from a bed to a chair (including a wheelchair)?: None Help needed standing up from a chair using your arms (e.g., wheelchair or bedside chair)?: None Help needed to walk in hospital room?: None Help needed climbing 3-5 steps with a railing? : None 6 Click Score: 24    End of Session   Activity Tolerance: Patient tolerated treatment well Patient left: in chair;with call bell/phone within reach;with  family/visitor present        Time: 7356-7014 PT Time Calculation (min) (ACUTE ONLY): 10 min   Charges:   PT Evaluation $PT Eval Low Complexity: [place], PT Acute Rehabilitation  Office: [phone] Pager: [phone]

## 2022-07-31 NOTE — Progress Notes (Signed)
PROGRESS NOTE    Dana Dyer  OEV:[id number] DOB: 07-Sep-1951 DOA: 07/29/2022 PCP: Eulas Post, MD   Brief Narrative:   71 y.o. female with medical history significant for nonalcoholic fatty liver disease, cirrhosis, type 2 diabetes, hypertension, obstructive sleep apnea, iron deficiency anemia, hyperlipidemia, chronic anxiety/depression presented with abnormal labs/hyponatremia with sodium of 120.  She was started on gentle hydration.  Assessment & Plan:   Hyponatremia, most likely hypovolemic -Sodium 120 on presentation.  Gradually improving, 128 today.  Continue gentle hydration.  Repeat a.m. labs.  Diuretics on hold.  Nonalcoholic fatty liver disease/cirrhosis History of hepatic encephalopathy -Follows up with GI as an outpatient.  Diet is on hold.  No signs of fluid overload. -Mental status currently stable.  Continue lactulose.  Diabetes mellitus type 2 with hyperglycemia -A1c 6.8 on 06/24/2022.   -Continue CBGs with SSI along with long-acting insulin GERD -Continue famotidine  Chronic anxiety/depression -Continue escitalopram  Thrombocytopenia -Possibly from cirrhosis.  Platelets stable.  No signs of bleeding.  DC Lovenox.  Anemia of chronic disease -Possibly from cirrhosis.  Hemoglobin stable   DVT prophylaxis: DC Lovenox.  Start SCDs Code Status: Full Family Communication: None at bedside Disposition Plan: Status is: Inpatient Remains inpatient appropriate because: Of severity of illness.  Need for IV fluids.  Consultants: None  Procedures: None  Antimicrobials: None   Subjective: Patient seen and examined at bedside.  Feels slightly better.  Denies worsening abdominal pain, nausea, vomiting or fever.  Objective: Vitals:   07/30/22 1608 07/30/22 1839 07/30/22 2006 07/31/22 0417  BP: 131/65 128/67 138/71 (!) 129/58  Pulse: 92 97 (!) 107 83  Resp: 17 17    Temp: 98.3 F (36.8 C) 98.5 F (36.9 C) 98.3 F (36.8 C) 98.5 F (36.9 C)  TempSrc:  Oral Oral Oral Oral  SpO2: 97% 98% 99% 96%  Weight:      Height:        Intake/Output Summary (Last 24 hours) at 07/31/2022 1038 Last data filed at 07/31/2022 0544 Gross per 24 hour  Intake 1329.5 ml  Output --  Net 1329.5 ml   Filed Weights   07/29/22 1615  Weight: 76.6 kg    Examination:  General exam: Appears calm and comfortable.  Currently on room air. Respiratory system: Bilateral decreased breath sounds at bases with some scattered crackles Cardiovascular system: S1 & S2 heard, intermittently tachycardic Gastrointestinal system: Abdomen is slightly distended, soft and nontender. Normal bowel sounds heard. Extremities: No cyanosis, clubbing, edema  Central nervous system: Alert and oriented. No focal neurological deficits. Moving extremities Skin: No rashes, lesions or ulcers Psychiatry: Judgement and insight appear normal. Mood & affect appropriate.     Data Reviewed: I have personally reviewed following labs and imaging studies  CBC: Recent Labs  Lab 07/29/22 1350 07/29/22 1624 07/31/22 0020  WBC 4.5 7.3 4.1  NEUTROABS 3.1 4.7 2.9  HGB 11.3* 11.4* 10.6*  HCT 33.7* 34.4* 31.6*  MCV 81.5 80.6 82.9  PLT 99.0* 110* 79*   Basic Metabolic Panel: Recent Labs  Lab 07/30/22 0129 07/30/22 0812 07/30/22 1302 07/30/22 1814 07/31/22 0020  NA 122* 125* 128* 127* 128*  K 4.9 4.5 4.5 4.7 4.4  CL 93* 96* 99 98 98  CO2 23 22 22 22 22   GLUCOSE 213* 267* 121* 164* 186*  BUN 15 16 18 17 16   CREATININE 0.89 0.85 0.71 0.83 0.72  CALCIUM 9.3 8.9 9.2 8.9 8.7*  MG  --   --   --   --  1.8   GFR: Estimated Creatinine Clearance: 62.3 mL/min (by C-G formula based on SCr of 0.72 mg/dL). Liver Function Tests: Recent Labs  Lab 07/29/22 1350 07/29/22 1624 07/31/22 0020  AST 58* 75* 58*  ALT 77* 83* 75*  ALKPHOS 89 84 83  BILITOT 2.7* 2.9* 2.8*  PROT 6.6 7.2 6.2*  ALBUMIN 3.7 3.9 3.1*   No results for input(s): "LIPASE", "AMYLASE" in the last 168 hours. Recent Labs   Lab 07/29/22 1350 07/29/22 1920  AMMONIA 53* 40*   Coagulation Profile: Recent Labs  Lab 07/29/22 1350 07/29/22 1624  INR 1.2* 1.1   Cardiac Enzymes: No results for input(s): "CKTOTAL", "CKMB", "CKMBINDEX", "TROPONINI" in the last 168 hours. BNP (last 3 results) No results for input(s): "PROBNP" in the last 8760 hours. HbA1C: No results for input(s): "HGBA1C" in the last 72 hours. CBG: Recent Labs  Lab 07/30/22 0734 07/30/22 1153 07/30/22 1643 07/30/22 2003 07/31/22 0756  GLUCAP 292* 118* 164* 195* 95   Lipid Profile: No results for input(s): "CHOL", "HDL", "LDLCALC", "TRIG", "CHOLHDL", "LDLDIRECT" in the last 72 hours. Thyroid Function Tests: No results for input(s): "TSH", "T4TOTAL", "FREET4", "T3FREE", "THYROIDAB" in the last 72 hours. Anemia Panel: No results for input(s): "VITAMINB12", "FOLATE", "FERRITIN", "TIBC", "IRON", "RETICCTPCT" in the last 72 hours. Sepsis Labs: No results for input(s): "PROCALCITON", "LATICACIDVEN" in the last 168 hours.  No results found for this or any previous visit (from the past 240 hour(s)).       Radiology Studies: DG Chest Port 1 View  Result Date: 07/29/2022 CLINICAL DATA:  History of cirrhosis confusion EXAM: PORTABLE CHEST 1 VIEW COMPARISON:  01/12/2022 FINDINGS: Minimal atelectasis or scarring at left base. No consolidation or sizable effusion. Mildly coarse chronic interstitial opacity. Normal cardiac size. No pneumothorax IMPRESSION: Minimal atelectasis or scar at the left base Electronically Signed   By: Donavan Foil M.D.   On: 07/29/2022 21:16   CT Abdomen Pelvis W Contrast  Result Date: 07/29/2022 CLINICAL DATA:  Hyponatremia, cirrhosis EXAM: CT ABDOMEN AND PELVIS WITH CONTRAST TECHNIQUE: Multidetector CT imaging of the abdomen and pelvis was performed using the standard protocol following bolus administration of intravenous contrast. RADIATION DOSE REDUCTION: This exam was performed according to the departmental  dose-optimization program which includes automated exposure control, adjustment of the mA and/or kV according to patient size and/or use of iterative reconstruction technique. CONTRAST:  119m OMNIPAQUE IOHEXOL 300 MG/ML  SOLN COMPARISON:  Right upper quadrant ultrasound dated 06/09/2022. FINDINGS: Lower chest: Subpleural reticulation at the lung bases. Hepatobiliary: Cirrhosis.  No focal hepatic lesion is seen. Gallbladder is decompressed. No intrahepatic or extrahepatic ductal dilatation. Pancreas: Multiple cystic lesions in the uncinate process, measuring up to 4.1 cm (series 2/image 32). No pancreatic ductal dilatation. Spleen: Mild splenomegaly. Adrenals/Urinary Tract: Adrenal glands are within normal limits. Kidneys are within normal limits.  No hydronephrosis. Bladder is underdistended and unremarkable. Stomach/Bowel: Stomach is within normal limits. No evidence of bowel obstruction. Appendix is not discretely visualized. No colonic wall thickening or inflammatory changes. Vascular/Lymphatic: No evidence of abdominal aortic aneurysm. Portal vein is patent. Gastroesophageal varices with splenorenal shunt. Reproductive: Status post hysterectomy. No adnexal masses. Other: Small volume perihepatic ascites. Small periumbilical hernia with trace fluid (series 2/image 56). Musculoskeletal: Visualized osseous structures are within normal limits. IMPRESSION: Cirrhosis. No focal hepatic lesion is seen. Portal vein is patent. Gastroesophageal varices with splenorenal shunt. Small volume perihepatic ascites. Mild splenomegaly. Multiple cystic lesions in the uncinate process, measuring up to 4.1 cm, favoring side branch IPMNs.  Given size greater than 3 cm, EUS with cyst aspiration is suggested. Electronically Signed   By: Julian Hy M.D.   On: 07/29/2022 18:51        Scheduled Meds:  enoxaparin (LOVENOX) injection  40 mg Subcutaneous Q24H   escitalopram  10 mg Oral Daily   famotidine  20 mg Oral QHS    insulin aspart  0-5 Units Subcutaneous QHS   insulin aspart  0-9 Units Subcutaneous TID WC   insulin glargine-yfgn  30 Units Subcutaneous QHS   lactulose  20 g Oral TID   Continuous Infusions:  sodium chloride 75 mL/hr at 07/30/22 2204          Aline August, MD [facility] 07/31/2022, 10:38 AM

## 2022-07-31 NOTE — Progress Notes (Signed)
Pt has declined CPAP QHS.  

## 2022-08-01 ENCOUNTER — Telehealth: Payer: Self-pay | Admitting: Gastroenterology

## 2022-08-01 DIAGNOSIS — I10 Essential (primary) hypertension: Secondary | ICD-10-CM

## 2022-08-01 DIAGNOSIS — D696 Thrombocytopenia, unspecified: Secondary | ICD-10-CM

## 2022-08-01 DIAGNOSIS — E860 Dehydration: Secondary | ICD-10-CM | POA: Diagnosis not present

## 2022-08-01 DIAGNOSIS — K7460 Unspecified cirrhosis of liver: Secondary | ICD-10-CM

## 2022-08-01 DIAGNOSIS — E1165 Type 2 diabetes mellitus with hyperglycemia: Secondary | ICD-10-CM | POA: Diagnosis not present

## 2022-08-01 DIAGNOSIS — E871 Hypo-osmolality and hyponatremia: Secondary | ICD-10-CM | POA: Diagnosis not present

## 2022-08-01 LAB — COMPREHENSIVE METABOLIC PANEL
ALT: 69 U/L — ABNORMAL HIGH (ref 0–44)
AST: 57 U/L — ABNORMAL HIGH (ref 15–41)
Albumin: 2.8 g/dL — ABNORMAL LOW (ref 3.5–5.0)
Alkaline Phosphatase: 79 U/L (ref 38–126)
Anion gap: 6 (ref 5–15)
BUN: 13 mg/dL (ref 8–23)
CO2: 22 mmol/L (ref 22–32)
Calcium: 8.4 mg/dL — ABNORMAL LOW (ref 8.9–10.3)
Chloride: 102 mmol/L (ref 98–111)
Creatinine, Ser: 0.55 mg/dL (ref 0.44–1.00)
GFR, Estimated: >60 mL/min (ref >60)
Glucose, Bld: 187 mg/dL — ABNORMAL HIGH (ref 70–99)
Potassium: 4.3 mmol/L (ref 3.5–5.1)
Sodium: 130 mmol/L — ABNORMAL LOW (ref 135–145)
Total Bilirubin: 1.9 mg/dL — ABNORMAL HIGH (ref 0.3–1.2)
Total Protein: 5.6 g/dL — ABNORMAL LOW (ref 6.5–8.1)

## 2022-08-01 LAB — AFP TUMOR MARKER: AFP-Tumor Marker: 9.8 ng/mL — ABNORMAL HIGH

## 2022-08-01 LAB — MAGNESIUM: Magnesium: 1.8 mg/dL (ref 1.7–2.4)

## 2022-08-01 LAB — CBC
HCT: 29.1 % — ABNORMAL LOW (ref 36.0–46.0)
Hemoglobin: 9.6 g/dL — ABNORMAL LOW (ref 12.0–15.0)
MCH: 27.7 pg (ref 26.0–34.0)
MCHC: 33 g/dL (ref 30.0–36.0)
MCV: 84.1 fL (ref 80.0–100.0)
Platelets: 68 10*3/uL — ABNORMAL LOW (ref 150–400)
RBC: 3.46 MIL/uL — ABNORMAL LOW (ref 3.87–5.11)
RDW: 23.4 % — ABNORMAL HIGH (ref 11.5–15.5)
WBC: 3.5 10*3/uL — ABNORMAL LOW (ref 4.0–10.5)
nRBC: 0 % (ref 0.0–0.2)

## 2022-08-01 LAB — GLUCOSE, CAPILLARY
Glucose-Capillary: 104 mg/dL — ABNORMAL HIGH (ref 70–99)
Glucose-Capillary: 147 mg/dL — ABNORMAL HIGH (ref 70–99)

## 2022-08-01 MED ORDER — LACTULOSE 10 GM/15ML PO SOLN: 20.0000 g | Freq: Three times a day (TID) | ORAL | Status: DC

## 2022-08-01 MED ORDER — SODIUM CHLORIDE 1 G PO TABS: 1.0000 g | ORAL_TABLET | Freq: Three times a day (TID) | ORAL | 0 refills | Status: DC

## 2022-08-01 MED ORDER — LANTUS SOLOSTAR 100 UNIT/ML ~~LOC~~ SOPN: 30.0000 [IU] | PEN_INJECTOR | Freq: Every day | SUBCUTANEOUS | Status: DC

## 2022-08-01 MED ORDER — HUMALOG KWIKPEN 200 UNIT/ML ~~LOC~~ SOPN: 16.0000 [IU] | PEN_INJECTOR | Freq: Three times a day (TID) | SUBCUTANEOUS | Status: DC

## 2022-08-01 NOTE — Discharge Summary (Signed)
Physician Discharge Summary  Dana Dyer XTG:[id number] DOB: 01-16-1951 DOA: 07/29/2022  PCP: Eulas Post, MD  Admit date: 07/29/2022 Discharge date: 08/01/2022  Admitted From: Home Disposition: Home  Recommendations for Outpatient Follow-up:  Follow up with PCP in 1 week with repeat CBC/CMP Outpatient follow-up with gastroenterology Follow up in ED if symptoms worsen or new appear   Home Health: No Equipment/Devices: None  Discharge Condition: Stable CODE STATUS: Full Diet recommendation: Carb modified Brief/Interim Summary: 71 y.o. female with medical history significant for nonalcoholic fatty liver disease, cirrhosis, type 2 diabetes, hypertension, obstructive sleep apnea, iron deficiency anemia, hyperlipidemia, chronic anxiety/depression presented with abnormal labs/hyponatremia with sodium of 120.  She was started on gentle hydration.  During the hospitalization, her condition has gradually improved with improving sodium.  Sodium is 130 today.  She feels good to go home today.  She will be discharged home today on salt tablets.  Outpatient follow-up with PCP   Discharge Diagnoses:   Hyponatremia, most likely hypovolemic -Sodium 120 on presentation.  Gradually improving, 130 today.  Treated with gentle hydration. Diuretics to remain on hold on discharge. -Discharge patient home today on oral salt tablets.  Repeat BMP within a week and follow-up with PCP.   Nonalcoholic fatty liver disease/cirrhosis History of hepatic encephalopathy -Follows up with GI as an outpatient.  Diuretics on hold till reevaluation by PCP and/or GI.  No signs of fluid overload. -Mental status currently stable.  Continue lactulose.   Diabetes mellitus type 2 with hyperglycemia -A1c 6.8 on 06/24/2022.   -Continue home regimen   Chronic anxiety/depression -Continue escitalopram   Thrombocytopenia -Possibly from cirrhosis.  Platelets stable.  No signs of bleeding.  Outpatient  follow-up  Anemia of chronic disease -Possibly from cirrhosis.  Hemoglobin stable.  Outpatient follow-up  Discharge Instructions  Discharge Instructions     Ambulatory referral to Gastroenterology   Complete by: As directed    followup   What is the reason for referral?: Other   Diet Carb Modified   Complete by: As directed    Increase activity slowly   Complete by: As directed       Allergies as of 08/01/2022       Reactions   Amoxicillin-pot Clavulanate    Severe diarrhea   Codeine Sulfate    REACTION: "hyper"   Diflucan [fluconazole] Hives   Epinephrine Other (See Comments)   Racing heart rate   Erythromycin Base Diarrhea, Other (See Comments)   cramps   Invokana [canagliflozin] Other (See Comments)   Recurrent yeast infections   Irbesartan-hydrochlorothiazide Other (See Comments)   Unknown reaction   Victoza [liraglutide] Nausea Only   Latex Rash   Long term use   Ozempic (0.25 Or 0.5 Mg-dose) [semaglutide(0.25 Or 0.16m-dos)] Nausea Only        Medication List     STOP taking these medications    acetaminophen 500 MG tablet Commonly known as: TYLENOL   famotidine 20 MG tablet Commonly known as: PEPCID   furosemide 20 MG tablet Commonly known as: LASIX   Klor-Con M20 20 MEQ tablet Generic drug: potassium chloride SA   spironolactone 50 MG tablet Commonly known as: Aldactone       TAKE these medications    BD Pen Needle Nano 2nd Gen 32G X 4 MM Misc Generic drug: Insulin Pen Needle USE AS DIRECTED   clobetasol cream 0.05 % Commonly known as: TEMOVATE Apply 1 Application topically 2 (two) times daily as needed (yeast infection).   Dexcom G7  Sensor Misc 1 Device by Does not apply route as directed. Every 10 days   escitalopram 10 MG tablet Commonly known as: LEXAPRO TAKE 1 TABLET BY MOUTH EVERY DAY What changed: when to take this   HumaLOG KwikPen 200 UNIT/ML KwikPen Generic drug: insulin lispro Inject 16-21 Units into the skin 3  (three) times daily with meals. Dose is based on CBG   lactulose 10 GM/15ML solution Commonly known as: CHRONULAC Take 30 mLs (20 g total) by mouth 3 (three) times daily.   Lantus SoloStar 100 UNIT/ML Solostar Pen Generic drug: insulin glargine Inject 30 Units into the skin at bedtime.   metFORMIN 1000 MG tablet Commonly known as: GLUCOPHAGE TAKE 1 TABLET BY MOUTH TWICE A DAY   omeprazole 20 MG capsule Commonly known as: PRILOSEC Take 20 mg by mouth at bedtime.   sodium chloride 1 g tablet Take 1 tablet (1 g total) by mouth 3 (three) times daily with meals.        Follow-up Information     Burchette, Alinda Sierras, MD. Schedule an appointment as soon as possible for a visit in 1 week(s).   Specialty: Family Medicine Why: With repeat CBC/CMP Contact information: [place] [address] [phone]                Allergies  Allergen Reactions   Amoxicillin-Pot Clavulanate     Severe diarrhea   Codeine Sulfate     REACTION: "hyper"   Diflucan [Fluconazole] Hives   Epinephrine Other (See Comments)    Racing heart rate   Erythromycin Base Diarrhea and Other (See Comments)    cramps   Invokana [Canagliflozin] Other (See Comments)    Recurrent yeast infections   Irbesartan-Hydrochlorothiazide Other (See Comments)    Unknown reaction   Victoza [Liraglutide] Nausea Only   Latex Rash    Long term use   Ozempic (0.25 Or 0.5 Mg-Dose) [Semaglutide(0.25 Or 0.37m-Dos)] Nausea Only    Consultations: None   Procedures/Studies: DG Chest Port 1 View  Result Date: 07/29/2022 CLINICAL DATA:  History of cirrhosis confusion EXAM: PORTABLE CHEST 1 VIEW COMPARISON:  01/12/2022 FINDINGS: Minimal atelectasis or scarring at left base. No consolidation or sizable effusion. Mildly coarse chronic interstitial opacity. Normal cardiac size. No pneumothorax IMPRESSION: Minimal atelectasis or scar at the left base Electronically Signed   By: KDonavan FoilM.D.   On:  07/29/2022 21:16   CT Abdomen Pelvis W Contrast  Result Date: 07/29/2022 CLINICAL DATA:  Hyponatremia, cirrhosis EXAM: CT ABDOMEN AND PELVIS WITH CONTRAST TECHNIQUE: Multidetector CT imaging of the abdomen and pelvis was performed using the standard protocol following bolus administration of intravenous contrast. RADIATION DOSE REDUCTION: This exam was performed according to the departmental dose-optimization program which includes automated exposure control, adjustment of the mA and/or kV according to patient size and/or use of iterative reconstruction technique. CONTRAST:  1045mOMNIPAQUE IOHEXOL 300 MG/ML  SOLN COMPARISON:  Right upper quadrant ultrasound dated 06/09/2022. FINDINGS: Lower chest: Subpleural reticulation at the lung bases. Hepatobiliary: Cirrhosis.  No focal hepatic lesion is seen. Gallbladder is decompressed. No intrahepatic or extrahepatic ductal dilatation. Pancreas: Multiple cystic lesions in the uncinate process, measuring up to 4.1 cm (series 2/image 32). No pancreatic ductal dilatation. Spleen: Mild splenomegaly. Adrenals/Urinary Tract: Adrenal glands are within normal limits. Kidneys are within normal limits.  No hydronephrosis. Bladder is underdistended and unremarkable. Stomach/Bowel: Stomach is within normal limits. No evidence of bowel obstruction. Appendix is not discretely visualized. No colonic wall thickening or  inflammatory changes. Vascular/Lymphatic: No evidence of abdominal aortic aneurysm. Portal vein is patent. Gastroesophageal varices with splenorenal shunt. Reproductive: Status post hysterectomy. No adnexal masses. Other: Small volume perihepatic ascites. Small periumbilical hernia with trace fluid (series 2/image 56). Musculoskeletal: Visualized osseous structures are within normal limits. IMPRESSION: Cirrhosis. No focal hepatic lesion is seen. Portal vein is patent. Gastroesophageal varices with splenorenal shunt. Small volume perihepatic ascites. Mild splenomegaly.  Multiple cystic lesions in the uncinate process, measuring up to 4.1 cm, favoring side branch IPMNs. Given size greater than 3 cm, EUS with cyst aspiration is suggested. Electronically Signed   By: Julian Hy M.D.   On: 07/29/2022 18:51   CT CORONARY MORPH W/CTA COR W/SCORE W/CA W/CM &/OR WO/CM  Addendum Date: 07/14/2022   ADDENDUM REPORT: 07/14/2022 16:50 EXAM: OVER-READ INTERPRETATION  CT CHEST The following report is an over-read performed by radiologist Dr. Maudry Diego [facility] on 07/14/2022. This over-read does not include interpretation of cardiac or coronary anatomy or pathology. The coronary calcium score interpretation by the cardiologist is attached. COMPARISON:  None. FINDINGS: Vascular: Normal heart size. No pericardial effusion. Normal caliber thoracic aorta with no significant atherosclerotic disease. Mediastinum/Nodes: Small hiatal hernia and moderate circumferential wall thickening of the distal esophagus. No pathologically enlarged lymph nodes seen in the chest. Lungs/Pleura: Central airways are patent. No consolidation, pleural effusion or pneumothorax. Upper Abdomen: Cirrhotic liver morphology. Partially visualized abdominal ascites. Musculoskeletal: No chest wall mass or suspicious bone lesions identified. IMPRESSION: 1. Moderate circumferential wall thickening in the lower thoracic esophagus, most commonly due to reflux esophagitis. If there is clinical concern for Barrett's esophagus or esophageal neoplasm, GI referral for upper endoscopic correlation is recommended. 2. Cirrhotic liver morphology with partially visualized abdominal ascites. Electronically Signed   By: Yetta Glassman M.D.   On: 07/14/2022 16:50   Result Date: 07/14/2022 CLINICAL DATA:  71 year old female with chest pain EXAM: Cardiac/Coronary  CTA TECHNIQUE: The patient was scanned on a [employer]. FINDINGS: A 80 kV prospective scan was triggered in the descending thoracic aorta at  111 HU's. Axial non-contrast 3 mm slices were carried out through the heart. The data set was analyzed on a dedicated work station and scored using the [place]. Gantry rotation speed was 250 msecs and collimation was .6 mm. 0.8 mg of sl NTG was given. The 3D data set was reconstructed in 5% intervals of the 67-82 % of the R-R cycle. Diastolic phases were analyzed on a dedicated work station using MPR, MIP and VRT modes. The patient received 80 cc of contrast. Image quality: good Aorta:  Normal size.  No calcifications.  No dissection. Aortic Valve:  Trileaflet.  No calcifications. Coronary Arteries:  Normal coronary origin.  Right dominance. RCA is a small non dominant artery.  There is no plaque. Left main is a large artery that gives rise to LAD and LCX arteries. LAD is a large vessel that has 2 diagonals. There is mild mid LAD stenosis (30-49%). There is moderate 50-69% stenosis of small first diagonal branch. FFR is normal. LCX is a large dominant artery that gives rise to one large OM1 branch and PDA. There is no plaque. Other findings: Normal pulmonary vein drainage into the left atrium. Normal left atrial appendage without a thrombus. Normal size of the pulmonary artery. Please see radiology report for non cardiac findings. IMPRESSION: 1. Coronary calcium score of 0. 2. Normal coronary origin with LEFT dominance. 3. LAD is a large vessel that has 2  diagonals. There is mild mid LAD stenosis (30-49%). There is moderate 50-69% stenosis of small first diagonal branch. FFR is normal. Electronically Signed: By: Candee Furbish M.D. On: 07/14/2022 16:35   CT CORONARY FRACTIONAL FLOW RESERVE DATA PREP  Result Date: 07/14/2022 EXAM: FFRCT ANALYSIS FINDINGS: FFRct analysis was performed on the original cardiac CT angiogram dataset. Diagrammatic representation of the FFRct analysis is provided in a separate PDF document in PACS. This dictation was created using the PDF document and an interactive 3D model of the  results. 3D model is not available in the EMR/PACS. Normal FFR range is >0.80. 1. Left Main: Normal 2. LAD: Normal 3. LCX: Normal 4. Ramus: N/a 5. RCA: Normal IMPRESSION: Normal FFR. There is no evidence of flow limiting coronary artery disease Note: These examples are not recommendations of [employer] and only provided as examples of what other customers are doing. Electronically Signed   By: Candee Furbish M.D.   On: 07/14/2022 16:39   CT CORONARY FRACTIONAL FLOW RESERVE FLUID ANALYSIS  Result Date: 07/14/2022 EXAM: FFRCT ANALYSIS FINDINGS: FFRct analysis was performed on the original cardiac CT angiogram dataset. Diagrammatic representation of the FFRct analysis is provided in a separate PDF document in PACS. This dictation was created using the PDF document and an interactive 3D model of the results. 3D model is not available in the EMR/PACS. Normal FFR range is >0.80. 1. Left Main: Normal 2. LAD: Normal 3. LCX: Normal 4. Ramus: N/a 5. RCA: Normal IMPRESSION: Normal FFR. There is no evidence of flow limiting coronary artery disease Note: These examples are not recommendations of HeartFlow and only provided as examples of what other customers are doing. Electronically Signed   By: Candee Furbish M.D.   On: 07/14/2022 16:39      Subjective: Patient seen and examined at bedside.  No overnight fever, vomiting, chest pain or shortness of breath reported.  Feels okay to go home today.  Discharge Exam: Vitals:   08/01/22 0452 08/01/22 0718  BP: 125/74   Pulse: 89   Resp: 16 17  Temp: 98.4 F (36.9 C)   SpO2: 97%     General: Pt is alert, awake, not in acute distress.  Currently on room air. Cardiovascular: rate controlled, S1/S2 + Respiratory: bilateral decreased breath sounds at bases Abdominal: Soft, NT, ND, bowel sounds + Extremities: no edema, no cyanosis    The results of significant diagnostics from this hospitalization (including imaging, microbiology, ancillary and laboratory) are  listed below for reference.     Microbiology: No results found for this or any previous visit (from the past 240 hour(s)).   Labs: BNP (last 3 results) No results for input(s): "BNP" in the last 8760 hours. Basic Metabolic Panel: Recent Labs  Lab 07/30/22 1302 07/30/22 1814 07/31/22 0020 07/31/22 1923 08/01/22 0341  NA 128* 127* 128* 131* 130*  K 4.5 4.7 4.4 4.5 4.3  CL 99 98 98 101 102  CO2 22 22 22 23 22   GLUCOSE 121* 164* 186* 222* 187*  BUN 18 17 16 15 13   CREATININE 0.71 0.83 0.72 0.72 0.55  CALCIUM 9.2 8.9 8.7* 8.9 8.4*  MG  --   --  1.8  --  1.8   Liver Function Tests: Recent Labs  Lab 07/29/22 1350 07/29/22 1624 07/31/22 0020 08/01/22 0341  AST 58* 75* 58* 57*  ALT 77* 83* 75* 69*  ALKPHOS 89 84 83 79  BILITOT 2.7* 2.9* 2.8* 1.9*  PROT 6.6 7.2 6.2* 5.6*  ALBUMIN 3.7  3.9 3.1* 2.8*   No results for input(s): "LIPASE", "AMYLASE" in the last 168 hours. Recent Labs  Lab 07/29/22 1350 07/29/22 1920  AMMONIA 53* 40*   CBC: Recent Labs  Lab 07/29/22 1350 07/29/22 1624 07/30/22 0812 07/31/22 0020 08/01/22 0341  WBC 4.5 7.3 5.0 4.1 3.5*  NEUTROABS 3.1 4.7  --  2.9  --   HGB 11.3* 11.4* 10.9* 10.6* 9.6*  HCT 33.7* 34.4* 32.7* 31.6* 29.1*  MCV 81.5 80.6 83.8 82.9 84.1  PLT 99.0* 110* 88* 79* 68*   Cardiac Enzymes: No results for input(s): "CKTOTAL", "CKMB", "CKMBINDEX", "TROPONINI" in the last 168 hours. BNP: Invalid input(s): "POCBNP" CBG: Recent Labs  Lab 07/31/22 0756 07/31/22 1108 07/31/22 1618 07/31/22 2145 08/01/22 0737  GLUCAP 95 182* 196* 149* 104*   D-Dimer No results for input(s): "DDIMER" in the last 72 hours. Hgb A1c No results for input(s): "HGBA1C" in the last 72 hours. Lipid Profile No results for input(s): "CHOL", "HDL", "LDLCALC", "TRIG", "CHOLHDL", "LDLDIRECT" in the last 72 hours. Thyroid function studies No results for input(s): "TSH", "T4TOTAL", "T3FREE", "THYROIDAB" in the last 72 hours.  Invalid input(s):  "FREET3" Anemia work up No results for input(s): "VITAMINB12", "FOLATE", "FERRITIN", "TIBC", "IRON", "RETICCTPCT" in the last 72 hours. Urinalysis    Component Value Date/Time   COLORURINE YELLOW 07/29/2022 1942   APPEARANCEUR CLEAR 07/29/2022 1942   LABSPEC >1.046 (H) 07/29/2022 1942   PHURINE 5.5 07/29/2022 1942   GLUCOSEU NEGATIVE 07/29/2022 1942   HGBUR NEGATIVE 07/29/2022 1942   HGBUR negative 01/08/2010 0826   BILIRUBINUR NEGATIVE 07/29/2022 1942   BILIRUBINUR n 06/12/2014 0916   KETONESUR NEGATIVE 07/29/2022 1942   PROTEINUR NEGATIVE 07/29/2022 1942   UROBILINOGEN 1.0 06/12/2014 0916   UROBILINOGEN 1.0 01/08/2010 0826   NITRITE NEGATIVE 07/29/2022 1942   LEUKOCYTESUR NEGATIVE 07/29/2022 1942   Sepsis Labs Recent Labs  Lab 07/29/22 1624 07/30/22 0812 07/31/22 0020 08/01/22 0341  WBC 7.3 5.0 4.1 3.5*   Microbiology No results found for this or any previous visit (from the past 240 hour(s)).   Time coordinating discharge: 35 minutes  SIGNED:   Aline August, MD  [facility] 08/01/2022, 10:11 AM

## 2022-08-01 NOTE — TOC Transition Note (Signed)
Transition of Care [facility]) - CM/SW Discharge Note   Patient Details  Name: Dana Dyer MRN: [id number] Date of Birth: [birth year]  Transition of Care [facility]) CM/SW Contact:  Leeroy Cha, RN Phone Number: 08/01/2022, 11:14 AM   Clinical Narrative:    Dcd to home with no toc needs present.   Final next level of care: Home/Self Care Barriers to Discharge: Barriers Resolved   Patient Goals and CMS Choice Patient states their goals for this hospitalization and ongoing recovery are:: to return to my home CMS Medicare.gov Compare Post Acute Care list provided to:: Patient    Discharge Placement                       Discharge Plan and Services                                     Social Determinants of Health (SDOH) Interventions     Readmission Risk Interventions   No data to display

## 2022-08-01 NOTE — Plan of Care (Signed)

## 2022-08-01 NOTE — TOC Initial Note (Signed)
Transition of Care [facility]) - Initial/Assessment Note    Patient Details  Name: Dana Dyer MRN: [id number] Date of Birth: 11-Oct-1951  Transition of Care [facility]) CM/SW Contact:    Leeroy Cha, RN Phone Number: 08/01/2022, 9:11 AM  Clinical Narrative:                   Transition of Care [facility]) Screening Note   Patient Details  Name: Dana Dyer Date of Birth: 1951/07/01   Transition of Care [facility]) CM/SW Contact:    Leeroy Cha, RN Phone Number: 08/01/2022, 9:11 AM    Transition of Care Department [facility]) has reviewed patient and no TOC needs have been identified at this time. We will continue to monitor patient advancement through interdisciplinary progression rounds. If new patient transition needs arise, please place a TOC consult.     Barriers to Discharge: Continued Medical Work up   Patient Goals and CMS Choice Patient states their goals for this hospitalization and ongoing recovery are:: to return to my home CMS Medicare.gov Compare Post Acute Care list provided to:: Patient    Expected Discharge Plan and Services         Living arrangements for the past 2 months: Single Family Home                                      Prior Living Arrangements/Services Living arrangements for the past 2 months: [place] Lives with:: Spouse Patient language and need for interpreter reviewed:: Yes Do you feel safe going back to the place where you live?: Yes            Criminal Activity/Legal Involvement Pertinent to Current Situation/Hospitalization: No - Comment as needed  Activities of Daily Living Home Assistive Devices/Equipment: External Monitoring Devices (dexcom) ADL Screening (condition at time of admission) Patient's cognitive ability adequate to safely complete daily activities?: Yes Is the patient deaf or have difficulty hearing?: No Does the patient have difficulty seeing, even when wearing glasses/contacts?: No Does the  patient have difficulty concentrating, remembering, or making decisions?: No Patient able to express need for assistance with ADLs?: Yes Does the patient have difficulty dressing or bathing?: No Independently performs ADLs?: Yes (appropriate for developmental age) Does the patient have difficulty walking or climbing stairs?: No Weakness of Legs: None Weakness of Arms/Hands: None  Permission Sought/Granted                  Emotional Assessment Appearance:: Appears stated age Attitude/Demeanor/Rapport: Engaged Affect (typically observed): Calm Orientation: : Oriented to Self, Oriented to Place, Oriented to  Time, Oriented to Situation Alcohol / Substance Use: Never Used Psych Involvement: No (comment)  Admission diagnosis:  Hyponatremia [E87.1] Dehydration with hyponatremia [E86.0, E87.1] Patient Active Problem List   Diagnosis Date Noted   Dehydration with hyponatremia 07/29/2022   Iron deficiency anemia 07/06/2022   Iron deficiency anemia due to chronic blood loss 06/28/2022   Hyperkalemia 03/17/2022   Hyponatremia 03/17/2022   Degenerative disc disease, cervical 10/05/2021   Type 2 diabetes mellitus with diabetic polyneuropathy, with long-term current use of insulin ([place]) 04/29/2020   Dyslipidemia 09/09/2019   Type 2 diabetes mellitus with hyperglycemia, with long-term current use of insulin ([place]) 09/09/2019   Thrombocytopenia ([place]) 08/23/2018   Splenomegaly 08/23/2018   Cirrhosis ([place]) 08/23/2018   Depression, major, single episode, severe ([place]) 07/31/2017   Low serum vitamin B12 07/31/2017  Degenerative disc disease, lumbar 09/19/2016   Arthritis, midfoot 04/12/2016   Tarsal tunnel syndrome of right side 03/17/2016   Tibialis posterior tendon tear, nontraumatic 02/25/2016   Biceps tendinitis on left 07/14/2015   Tremor of left hand 07/14/2015   Mild carpal tunnel syndrome of left wrist 06/23/2014   Greater trochanteric bursitis of right hip 06/23/2014   Obesity  (BMI 30-39.9) 09/05/2013   Asthmatic bronchitis 08/12/2013   TRANSAMINASES, SERUM, ELEVATED 07/08/2009   Diabetes type 2, uncontrolled 03/16/2009   Hypersomnolence 10/21/2008   Hyperlipemia 10/09/2007   Obstructive sleep apnea 10/09/2007   Essential hypertension 10/09/2007   ALLERGIC RHINITIS 10/09/2007   FIBROMYALGIA 10/09/2007   PCP:  Eulas Post, MD Pharmacy:   [id number] [place] [id number]: [phone]: [phone]    Social Determinants of Health (SDOH) Interventions    Readmission Risk Interventions   No data to display

## 2022-08-01 NOTE — Telephone Encounter (Signed)
Left message on machine to call back  

## 2022-08-01 NOTE — Telephone Encounter (Signed)
Patient went to the hospital, sodium levels were depleted, was told to stop taking a medication for her cirrhosis and was told to be scheduled within a week here for a follow up. Is scheduled with Jaclyn Shaggy in Sept.

## 2022-08-01 NOTE — Plan of Care (Signed)

## 2022-08-02 ENCOUNTER — Other Ambulatory Visit: Payer: Self-pay | Admitting: Family Medicine

## 2022-08-02 ENCOUNTER — Telehealth: Payer: Self-pay

## 2022-08-02 ENCOUNTER — Other Ambulatory Visit: Payer: Self-pay | Admitting: Gastroenterology

## 2022-08-02 NOTE — Telephone Encounter (Signed)
Transition Care Management Unsuccessful Follow-up Telephone Call  Date of discharge and from where:  [place] 08-01-22 Dx: hyponatremia  Attempts:  1st Attempt  Reason for unsuccessful TCM follow-up call:  Left voice message  Transition Care Management Follow-up Telephone Call Date of discharge and from where: [place] 08-01-22 Dx: hyponatremia How have you been since you were released from the hospital? Doing fine  Any questions or concerns? No  Items Reviewed: Did the pt receive and understand the discharge instructions provided? Yes  Medications obtained and verified? Yes  Other? No  Any new allergies since your discharge? No  Dietary orders reviewed? Yes Do you have support at home? Yes   Home Care and Equipment/Supplies: Were home health services ordered? no If so, what is the name of the agency? na  Has the agency set up a time to come to the patient's home? not applicable Were any new equipment or medical supplies ordered?  No What is the name of the medical supply agency? na Were you able to get the supplies/equipment? not applicable Do you have any questions related to the use of the equipment or supplies? No  Functional Questionnaire: (I = Independent and D = Dependent) ADLs: I  Bathing/Dressing- I  Meal Prep- I  Eating- I  Maintaining continence- I  Transferring/Ambulation- I  Managing Meds- I  Follow up appointments reviewed:  PCP Hospital f/u appt confirmed? Yes  Scheduled to see Dr Elease Hashimoto on 08-09-22 @ [place] [facility] f/u appt confirmed? Yes  Scheduled to see Dr Fabio Asa on 08-04-22 @ 10am. Are transportation arrangements needed? No  If their condition worsens, is the pt aware to call PCP or go to the Emergency Dept.? Yes Was the patient provided with contact information for the PCP's office or ED? Yes Was to pt encouraged to call back with questions or concerns? Yes

## 2022-08-02 NOTE — Telephone Encounter (Signed)
I spoke with the pt and she has been rescheduled to 8/31 with Ellouise Newer for post ED hyponatremia f/u.

## 2022-08-03 ENCOUNTER — Other Ambulatory Visit: Payer: Self-pay | Admitting: Family Medicine

## 2022-08-03 ENCOUNTER — Telehealth: Payer: Self-pay | Admitting: Gastroenterology

## 2022-08-03 DIAGNOSIS — Z1231 Encounter for screening mammogram for malignant neoplasm of breast: Secondary | ICD-10-CM

## 2022-08-03 NOTE — Telephone Encounter (Signed)
Patient called, requesting a lab work to be sent in for her. Requesting a call back. Please call to advise.

## 2022-08-03 NOTE — Telephone Encounter (Signed)
The pt has been advised that Dana Dyer will determine what labs she needs at her office visit tomorrow. The pt has been advised of the information and verbalized understanding.

## 2022-08-04 ENCOUNTER — Encounter: Payer: Self-pay | Admitting: Physician Assistant

## 2022-08-04 ENCOUNTER — Ambulatory Visit: Payer: Medicare PPO | Admitting: Physician Assistant

## 2022-08-04 VITALS — BP 118/68 | HR 100 | Ht 63.0 in | Wt 172.6 lb

## 2022-08-04 DIAGNOSIS — K760 Fatty (change of) liver, not elsewhere classified: Secondary | ICD-10-CM

## 2022-08-04 DIAGNOSIS — K7460 Unspecified cirrhosis of liver: Secondary | ICD-10-CM | POA: Diagnosis not present

## 2022-08-04 DIAGNOSIS — E871 Hypo-osmolality and hyponatremia: Secondary | ICD-10-CM

## 2022-08-04 NOTE — Patient Instructions (Signed)
Your provider has requested that you go to the basement level for lab work on Tuesday 08/09/22. Press "B" on the elevator. The lab is located at the first door on the left as you exit the elevator.  _______________________________________________________  If you are age 73 or older, your body mass index should be between 23-30. Your Body mass index is 30.57 kg/m. If this is out of the aforementioned range listed, please consider follow up with your Primary Care Provider.  If you are age 77 or younger, your body mass index should be between 19-25. Your Body mass index is 30.57 kg/m. If this is out of the aformentioned range listed, please consider follow up with your Primary Care Provider.   ________________________________________________________  The [place] [facility] providers would like to encourage you to use [facility] to communicate with providers for non-urgent requests or questions.  Due to long hold times on the telephone, sending your provider a message by [facility] may be a faster and more efficient way to get a response.  Please allow 48 business hours for a response.  Please remember that this is for non-urgent requests.  _______________________________________________________

## 2022-08-04 NOTE — Progress Notes (Signed)
Chief Complaint: Follow-up hospitalization for hyponatremia, history of nonalcoholic cirrhosis  Review of pertinent gastrointestinal problems: 1.  History of adenomatous polyps in her colon.  Colonoscopy 2006 Dr. Timmothy Euler found no polyps.  Colonoscopy Dr. Ardis Hughs December 2016 found 7 subcentimeter polyps.  These were all adenomatous polyps on pathology.  Also documented left-sided diverticulosis.  I recommend she have a repeat colonoscopy at 3-year interval.   Colonoscopy January 2020 four polyps were removed from her colon, all were adenomatous on pathology.  She was recommended to have repeat colonoscopy at 3-year interval.  Colonoscopy 03/2022 2 subcentimeter adenomas.  A 1 cm ulcer in the cecum was noted this did not appear malignant.  Biopsies showed no neoplasm, suspect from meloxicam she was taken for back pains.  Previous tattoo of colon area was normal. 2. Cirrhosis, likely from longstanding fatty liver disease, out-of-control diabetes.  Diagnosed 2019.  Lab workup 2019: Hep C Ab negative, Hep B Surface Ag negative, hepatitis B surface antibody negative, hep B Core Igm negative, Hep A IgM negative. Ferritin normal, HIV negative, ANA negative, antimitochondrial antibody negative, hepatitis a antibody total negative; slightly low platelets, transaminases slightly elevated.  No edema. [place] 07/2018: slightly nodular, suspicious for cirrhosis. [place] 12/2019 cirrhosis without mass lesions.  Ultrasound 08/2020 showed cirrhosis without focal mass lesions.  Gallbladder thick but without Murphy sign.  Ultrasound May 2022 showed cirrhosis, trace perihepatic ascites, no focal lesions in the liver. [place] 12/2021 nodular liver, no masses.  EGD 12/2018 small distal esophagus varices, mild portal gastropathy, no gastric varices.  She was started on nadolol (eventually at 9m daily). EGD 03/2022 small esophagus varices, mild portal gastropathy.  Biopsies of minimal gastritis showed no H. pylori AFP 11/2021 normal MELD 12  (11/2021 labs) Hepatitis A, B vaccination series started 11/2018 Mild encephalopathy, memory disturbance, slight tremors, ammonia 65 11/2021.  Started lactulose which might have raised her blood sugars a bit.  HPI:    Mrs. LSpellsis a 71year old female with a past medical history as listed below, including nonalcoholic cirrhosis, known to Dr. JArdis Hughs who was referred to me by BEulas Post MD for low up after recent hospitalization for hyponatremia.    05/27/2022 office visit with Dr. JArdis Hughsfor follow-up for nonalcoholic cirrhosis.  She had been doing well overall using 3 tablespoons of lactulose at bedtime every night.  At that time noted to have a meld of 12, doing well clinically.  Discussed repeat ultrasound due in a month.    07/28/2022 patient called and described she had been very sick and thought her ammonia was building up.  Dr. GCarlean Purlrecommended CBC, c-Met, INR, AFP and ammonia and if she continued to be sick that she should go to the ER.    07/29/2022 AFP elevated at 9.8, ammonia elevated at 53, PT/INR 12.6/1.2, CMP with a sodium low at 120 (corrected to 122 due to hyperglycemia), recommended ED with admission.    07/29/2022-08/01/2022 patient admitted to the hospital for hyponatremia thought hypovolemic.  Her sodium was 120 on presentation and gradually improved to 130.  She was discharged home with salt tablets and recommended to repeat a BMP within a week and follow-up with PCP.  Her diuretics were put on hold including Spironolactone and Lasix.    Today, patient presents to clinic accompanied by her husband who assists with history.  She tells me that overall she is feeling better since her recent hospitalization for low sodium.  She took her last salt tablet this morning.  Tells  me she does remain a little more shaky than normal and is worried about her ammonia level, but seems quite clearheaded per her husband and herself.  Does tell me she has a benign essential tremor but it is  never this bad.  Tells me at the hospital they took her off of her diuretics and wonders if she needs to start them back.  Is also requesting iron levels be drawn as she had her last iron infusion about 3 weeks ago and no one has rechecked anything.  Otherwise no swelling in her abdomen or feet.  In general feeling much better.    Denies fever, chills, weight loss or blood in her stool.     Past Medical History:  Diagnosis Date   ALLERGIC RHINITIS 10/09/2007   Allergy    Arthritis    hips, spine   DIABETES MELLITUS, UNCONTROLLED 03/16/2009   FATIGUE 10/21/2008   FIBROMYALGIA 10/09/2007   GERD (gastroesophageal reflux disease)    diet  controlled, no meds   H/O vaginal hysterectomy    HYPERLIPIDEMIA 10/09/2007   diet controlled, no meds   HYPERTENSION 10/09/2007   Morton's neuroma    OBSTRUCTIVE SLEEP APNEA 10/09/2007   Palpitations    Plantar fasciitis    History - right   Sleep apnea    Uses CPAP   TRANSAMINASES, SERUM, ELEVATED 07/08/2009    Past Surgical History:  Procedure Laterality Date   ABDOMINAL HYSTERECTOMY  1979   prolapsed uterus   BASAL CELL CARCINOMA EXCISION     BREAST BIOPSY Left 2012   left 2012 stereo   BREAST REDUCTION SURGERY Bilateral 1992   COLONOSCOPY  2006   [place]   mamoplasty     reduction   REDUCTION MAMMAPLASTY Bilateral 1992   TONSILLECTOMY  1959   TUBAL LIGATION  1976    Current Outpatient Medications  Medication Sig Dispense Refill   BD PEN NEEDLE NANO 2ND GEN 32G X 4 MM MISC USE AS DIRECTED 400 each 3   clobetasol cream (TEMOVATE) 7.82 % Apply 1 Application topically 2 (two) times daily as needed (yeast infection).     Continuous Blood Gluc Sensor (DEXCOM G7 SENSOR) MISC 1 Device by Does not apply route as directed. Every 10 days 9 each 3   escitalopram (LEXAPRO) 10 MG tablet TAKE 1 TABLET BY MOUTH EVERY DAY (Patient taking differently: Take 10 mg by mouth every morning.) 90 tablet 0   insulin glargine (LANTUS SOLOSTAR) 100 UNIT/ML  Solostar Pen Inject 30 Units into the skin at bedtime.     insulin lispro (HUMALOG KWIKPEN) 200 UNIT/ML KwikPen Inject 16-21 Units into the skin 3 (three) times daily with meals. Dose is based on CBG     lactulose (CHRONULAC) 10 GM/15ML solution Take 30 mLs (20 g total) by mouth 3 (three) times daily.     metFORMIN (GLUCOPHAGE) 1000 MG tablet TAKE 1 TABLET BY MOUTH TWICE A DAY (Patient taking differently: Take 1,000 mg by mouth 2 (two) times daily.) 180 tablet 3   omeprazole (PRILOSEC) 20 MG capsule Take 20 mg by mouth at bedtime.     sodium chloride 1 g tablet Take 1 tablet (1 g total) by mouth 3 (three) times daily with meals. 30 tablet 0   No current facility-administered medications for this visit.    Allergies as of 08/04/2022 - Review Complete 08/04/2022  Allergen Reaction Noted   Amoxicillin-pot clavulanate  03/09/2012   Codeine sulfate  03/16/2009   Diflucan [fluconazole] Hives 03/16/2022   Epinephrine  Other (See Comments) 07/30/2022   Erythromycin base Diarrhea and Other (See Comments) 04/07/2009   Invokana [canagliflozin] Other (See Comments) 05/18/2015   Irbesartan-hydrochlorothiazide Other (See Comments) 04/07/2009   Victoza [liraglutide] Nausea Only 03/10/2016   Latex Rash    Ozempic (0.25 or 0.5 mg-dose) [semaglutide(0.25 or 0.54m-dos)] Nausea Only 09/09/2019    Family History  Problem Relation Age of Onset   Diabetes Mother    Arthritis Mother    Cancer Mother        breast   Breast cancer Mother 782  Alcohol abuse Father    Arthritis Father    Hypertension Maternal Grandfather    Miscarriages / Stillbirths Paternal Grandfather    Breast cancer Maternal Aunt        diagnosed in her 746's  Colon polyps Neg Hx    Colon cancer Neg Hx    Esophageal cancer Neg Hx    Rectal cancer Neg Hx    Stomach cancer Neg Hx     Social History   Socioeconomic History   Marital status: Married    Spouse name: Not on file   Number of children: Not on file   Years of  education: Not on file   Highest education level: Not on file  Occupational History   Not on file  Tobacco Use   Smoking status: Never   Smokeless tobacco: Never  Vaping Use   Vaping Use: Never used  Substance and Sexual Activity   Alcohol use: No    Alcohol/week: 0.0 standard drinks of alcohol   Drug use: No   Sexual activity: Yes    Birth control/protection: Other-see comments, Post-menopausal    Comment: Hysterectomy  Other Topics Concern   Not on file  Social History Narrative   Not on file   Social Determinants of Health   Financial Resource Strain: Low Risk  (08/04/2021)   Overall Financial Resource Strain (CARDIA)    Difficulty of Paying Living Expenses: Not hard at all  Food Insecurity: No Food Insecurity (08/04/2021)   Hunger Vital Sign    Worried About Running Out of Food in the Last Year: Never true    Ran Out of Food in the Last Year: Never true  Transportation Needs: No Transportation Needs (08/04/2021)   PRAPARE - THydrologist(Medical): No    Lack of Transportation (Non-Medical): No  Physical Activity: Insufficiently Active (08/04/2021)   Exercise Vital Sign    Days of Exercise per Week: 3 days    Minutes of Exercise per Session: 30 min  Stress: No Stress Concern Present (08/04/2021)   [place]   Feeling of Stress : Not at all  Social Connections: Unknown (08/04/2021)   Social Connection and Isolation Panel [NHANES]    Frequency of Communication with Friends and Family: Three times a week    Frequency of Social Gatherings with Friends and Family: Not on file    Attends Religious Services: Not on file    Active Member of Clubs or Organizations: No    Attends CArchivistMeetings: Never    Marital Status: Married  IHuman resources officerViolence: Not At Risk (08/04/2021)   Humiliation, Afraid, Rape, and Kick questionnaire    Fear of Current or Ex-Partner: No     Emotionally Abused: No    Physically Abused: No    Sexually Abused: No    Review of Systems:    Constitutional: No weight loss,  fever or chills Cardiovascular: No chest pain Respiratory: No SOB  Gastrointestinal: See HPI and otherwise negative   Physical Exam:  Vital signs: BP 118/68   Pulse 100   Ht _0  (1.6 m)   Wt 172 lb 9.6 oz (78.3 kg)   SpO2 97%   BMI 30.57 kg/m   Constitutional:   Pleasant Elderly Caucasian female appears to be in NAD, Well developed, Well nourished, alert and cooperative Respiratory: Respirations even and unlabored. Lungs clear to auscultation bilaterally.   No wheezes, crackles, or rhonchi.  Cardiovascular: Normal S1, S2. No MRG. Regular rate and rhythm. No peripheral edema, cyanosis or pallor.  Gastrointestinal:  Soft, nondistended, nontender. No rebound or guarding. Normal bowel sounds. No appreciable masses or hepatomegaly. Rectal:  Not performed.  Msk:  Symmetrical without gross deformities. Without edema, no deformity or joint abnormality. +slight tremor in hands Psychiatric: Oriented to person, place and time. Demonstrates good judgement and reason without abnormal affect or behaviors.  RELEVANT LABS AND IMAGING: CBC    Component Value Date/Time   WBC 3.5 (L) 08/01/2022 0341   RBC 3.46 (L) 08/01/2022 0341   HGB 9.6 (L) 08/01/2022 0341   HGB 9.2 (L) 06/28/2022 1129   HCT 29.1 (L) 08/01/2022 0341   PLT 68 (L) 08/01/2022 0341   PLT 123 (L) 06/28/2022 1129   MCV 84.1 08/01/2022 0341   MCH 27.7 08/01/2022 0341   MCHC 33.0 08/01/2022 0341   RDW 23.4 (H) 08/01/2022 0341   LYMPHSABS 0.6 (L) 07/31/2022 0020   MONOABS 0.5 07/31/2022 0020   EOSABS 0.1 07/31/2022 0020   BASOSABS 0.0 07/31/2022 0020    CMP     Component Value Date/Time   NA 130 (L) 08/01/2022 0341   K 4.3 08/01/2022 0341   CL 102 08/01/2022 0341   CO2 22 08/01/2022 0341   GLUCOSE 187 (H) 08/01/2022 0341   BUN 13 08/01/2022 0341   CREATININE 0.55 08/01/2022 0341    CREATININE 0.87 06/28/2022 1129   CALCIUM 8.4 (L) 08/01/2022 0341   PROT 5.6 (L) 08/01/2022 0341   ALBUMIN 2.8 (L) 08/01/2022 0341   AST 57 (H) 08/01/2022 0341   AST 41 06/28/2022 1129   ALT 69 (H) 08/01/2022 0341   ALT 44 06/28/2022 1129   ALKPHOS 79 08/01/2022 0341   BILITOT 1.9 (H) 08/01/2022 0341   BILITOT 1.7 (H) 06/28/2022 1129   GFRNONAA >60 08/01/2022 0341   GFRNONAA >60 06/28/2022 1129    Assessment: 1.  NASH cirrhosis: Was doing well, recent admission for hyponatremia likely related to acute illness and vomiting, currently diuretics on hold and patient needs labs redrawn, AFP slightly elevated at last check 2.  Hyponatremia: Patient has been on salt tablets, her last this morning  Plan: 1.  Told patient we should wait at least a few days after her last salt tablet to recheck all of her labs.  These will be placed for Tuesday of next week (Monday we are close to Labor Day), 08/09/2022.  Ordered CBC, CMP, iron levels and ferritin per request from patient and ammonia level 2.  For now patient will continue to hold her Lasix and Spironolactone.  Based off of labs above could consider restarting the use or waiting another couple of weeks and rechecking labs. 3.  Patient to follow in clinic per recommendations after labs above.    (Chart today sent to Dr. Bryan Lemma as he is supervising and Dr. Ardis Hughs is out)  Ellouise Newer, PA-C [facility] 08/04/2022, 9:59 AM  Cc: Eulas Post, MD

## 2022-08-04 NOTE — Progress Notes (Signed)
Agree with the assessment and plan as outlined by Ellouise Newer, PA-C.  Additionally, I reviewed the CT from her recent hospital admission which was n/f cirrhotic appearing liver but no evidence of HCC.  Evidence of portal hypertension (varices, splenorenal shunt, small volume ascites, mild splenomegaly).  Additionally multiple cystic lesions in the uncinate process measuring up to 4.1 cm favoring sidebranch IPMN's.  Based on the size of these lesions, patient should be set up for EUS.  Please coordinate referral to Dr. Rush Landmark for EUS.   , DO, [facility]

## 2022-08-09 ENCOUNTER — Other Ambulatory Visit (INDEPENDENT_AMBULATORY_CARE_PROVIDER_SITE_OTHER): Payer: Medicare PPO

## 2022-08-09 ENCOUNTER — Encounter: Payer: Self-pay | Admitting: Family Medicine

## 2022-08-09 ENCOUNTER — Ambulatory Visit: Payer: Medicare PPO | Admitting: Family Medicine

## 2022-08-09 ENCOUNTER — Telehealth: Payer: Self-pay

## 2022-08-09 VITALS — BP 128/60 | HR 111 | Temp 97.9°F | Ht 63.0 in | Wt 178.4 lb

## 2022-08-09 DIAGNOSIS — E1142 Type 2 diabetes mellitus with diabetic polyneuropathy: Secondary | ICD-10-CM | POA: Diagnosis not present

## 2022-08-09 DIAGNOSIS — Z794 Long term (current) use of insulin: Secondary | ICD-10-CM | POA: Diagnosis not present

## 2022-08-09 DIAGNOSIS — E871 Hypo-osmolality and hyponatremia: Secondary | ICD-10-CM | POA: Diagnosis not present

## 2022-08-09 DIAGNOSIS — K7460 Unspecified cirrhosis of liver: Secondary | ICD-10-CM

## 2022-08-09 DIAGNOSIS — K760 Fatty (change of) liver, not elsewhere classified: Secondary | ICD-10-CM

## 2022-08-09 LAB — CBC WITH DIFFERENTIAL/PLATELET
Basophils Absolute: 0 10*3/uL (ref 0.0–0.1)
Basophils Relative: 0.5 % (ref 0.0–3.0)
Eosinophils Absolute: 0.1 10*3/uL (ref 0.0–0.7)
Eosinophils Relative: 2.3 % (ref 0.0–5.0)
HCT: 31.8 % — ABNORMAL LOW (ref 36.0–46.0)
Hemoglobin: 10.8 g/dL — ABNORMAL LOW (ref 12.0–15.0)
Lymphocytes Relative: 21.6 % (ref 12.0–46.0)
Lymphs Abs: 0.9 10*3/uL (ref 0.7–4.0)
MCHC: 33.9 g/dL (ref 30.0–36.0)
MCV: 85.2 fl (ref 78.0–100.0)
Monocytes Absolute: 0.5 10*3/uL (ref 0.1–1.0)
Monocytes Relative: 10.7 % (ref 3.0–12.0)
Neutro Abs: 2.8 10*3/uL (ref 1.4–7.7)
Neutrophils Relative %: 64.9 % (ref 43.0–77.0)
Platelets: 75 10*3/uL — ABNORMAL LOW (ref 150.0–400.0)
RBC: 3.73 Mil/uL — ABNORMAL LOW (ref 3.87–5.11)
RDW: 28 % — ABNORMAL HIGH (ref 11.5–15.5)
WBC: 4.4 10*3/uL (ref 4.0–10.5)

## 2022-08-09 LAB — AMMONIA: Ammonia: 57 umol/L — ABNORMAL HIGH (ref 11–35)

## 2022-08-09 LAB — IBC + FERRITIN
Ferritin: 114.4 ng/mL (ref 10.0–291.0)
Iron: 64 ug/dL (ref 42–145)
Saturation Ratios: 17.5 % — ABNORMAL LOW (ref 20.0–50.0)
TIBC: 365.4 ug/dL (ref 250.0–450.0)
Transferrin: 261 mg/dL (ref 212.0–360.0)

## 2022-08-09 LAB — COMPREHENSIVE METABOLIC PANEL
ALT: 55 U/L — ABNORMAL HIGH (ref 0–35)
AST: 45 U/L — ABNORMAL HIGH (ref 0–37)
Albumin: 3.3 g/dL — ABNORMAL LOW (ref 3.5–5.2)
Alkaline Phosphatase: 94 U/L (ref 39–117)
BUN: 15 mg/dL (ref 6–23)
CO2: 24 mEq/L (ref 19–32)
Calcium: 9.2 mg/dL (ref 8.4–10.5)
Chloride: 101 mEq/L (ref 96–112)
Creatinine, Ser: 0.75 mg/dL (ref 0.40–1.20)
GFR: 79.23 mL/min (ref >60.00)
Glucose, Bld: 111 mg/dL — ABNORMAL HIGH (ref 70–99)
Potassium: 4.3 mEq/L (ref 3.5–5.1)
Sodium: 136 mEq/L (ref 135–145)
Total Bilirubin: 1.9 mg/dL — ABNORMAL HIGH (ref 0.2–1.2)
Total Protein: 6.4 g/dL (ref 6.0–8.3)

## 2022-08-09 NOTE — Telephone Encounter (Signed)
Called and spoke with patient regarding recommendations. Pt is aware that we will have her complete an MRCP in about 3 months. Pt knows to keep her upcoming routine [place] appt. Pt had labs drawn today, she is aware that Anderson Malta is out of the office today and has not had a chance to review her labs. Pt is currently at her PCP's office, I told her that lab results are available via Epic for Dr. Carson Myrtle review. Pt verbalized understanding and had no concerns at the end of the call.   65-monthMRCP reminder in epic.

## 2022-08-09 NOTE — Progress Notes (Signed)
Established Patient Office Visit  Subjective   Patient ID: Dana Dyer, female    DOB: 1951-05-13  Age: 71 y.o. MRN: [id number]  Chief Complaint  Patient presents with   Hospitalization Follow-up    HPI   Seen for recent hospital follow-up.  She was admitted on 25 August and discharged on the 28th.  She has history of nonalcoholic fatty liver disease with cirrhosis, type 2 diabetes, hypertension, obstructive sleep apnea, iron deficiency anemia, hyperlipidemia, recurrent depression.  She had recent sodium level 120.  It was felt that her hyponatremia was likely hypovolemic.  Improved 130 prior to discharge.  Diuretics including Aldactone and Lasix held at discharge.  She was sent home on oral salt tablets.  Had follow-up labs earlier this morning with ammonia level 157, iron improved with ferritin 114, sodium 136, renal function stable, AST and ALT stable, albumin 3.3, hemoglobin improved to 10.8  She is followed closely by GI.  She had recent weight of 172 pounds at GI office last week.  She has been steadily gaining weight since discharge.  Has a little bit of edema in her ankles and legs but predominantly abdominal.  She does take lactulose regularly.  No recent confusion.  Past Medical History:  Diagnosis Date   ALLERGIC RHINITIS 10/09/2007   Allergy    Arthritis    hips, spine   DIABETES MELLITUS, UNCONTROLLED 03/16/2009   FATIGUE 10/21/2008   FIBROMYALGIA 10/09/2007   GERD (gastroesophageal reflux disease)    diet  controlled, no meds   H/O vaginal hysterectomy    HYPERLIPIDEMIA 10/09/2007   diet controlled, no meds   HYPERTENSION 10/09/2007   Morton's neuroma    OBSTRUCTIVE SLEEP APNEA 10/09/2007   Palpitations    Plantar fasciitis    History - right   Sleep apnea    Uses CPAP   TRANSAMINASES, SERUM, ELEVATED 07/08/2009   Past Surgical History:  Procedure Laterality Date   ABDOMINAL HYSTERECTOMY  1979   prolapsed uterus   BASAL CELL CARCINOMA EXCISION      BREAST BIOPSY Left 2012   left 2012 stereo   BREAST REDUCTION SURGERY Bilateral 1992   COLONOSCOPY  2006   [place]   mamoplasty     reduction   REDUCTION MAMMAPLASTY Bilateral [place]    reports that she has never smoked. She has never used smokeless tobacco. She reports that she does not drink alcohol and does not use drugs. family history includes Alcohol abuse in her father; Arthritis in her father and mother; Breast cancer in her maternal aunt; Breast cancer (age of onset: 17) in her mother; Cancer in her mother; Diabetes in her mother; Hypertension in her maternal grandfather; Miscarriages / [place] in her paternal grandfather. Allergies  Allergen Reactions   Amoxicillin-Pot Clavulanate     Severe diarrhea   Codeine Sulfate     REACTION: "hyper"   Diflucan [Fluconazole] Hives   Epinephrine Other (See Comments)    Racing heart rate   Erythromycin Base Diarrhea and Other (See Comments)    cramps   Invokana [Canagliflozin] Other (See Comments)    Recurrent yeast infections   Irbesartan-Hydrochlorothiazide Other (See Comments)    Unknown reaction   Victoza [Liraglutide] Nausea Only   Latex Rash    Long term use   Ozempic (0.25 Or 0.5 Mg-Dose) [Semaglutide(0.25 Or 0.95m-Dos)] Nausea Only      Review of Systems  Constitutional:  Negative for chills and fever.  Respiratory:  Negative for shortness of breath.   Cardiovascular:  Negative for chest pain.  Gastrointestinal:  Negative for abdominal pain, blood in stool, diarrhea, nausea and vomiting.  Genitourinary:  Negative for dysuria.  Neurological:  Negative for dizziness.      Objective:     BP 128/60 (BP Location: Left Arm, Patient Position: Sitting, Cuff Size: Large)   Pulse (!) 111   Temp 97.9 F (36.6 C) (Oral)   Ht 5' 3"  (1.6 m)   Wt 178 lb 6.4 oz (80.9 kg)   SpO2 98%   BMI 31.60 kg/m  BP Readings from Last 3 Encounters:  08/09/22 128/60  08/04/22 118/68   08/01/22 125/74   Wt Readings from Last 3 Encounters:  08/09/22 178 lb 6.4 oz (80.9 kg)  08/04/22 172 lb 9.6 oz (78.3 kg)  08/01/22 169 lb 1.5 oz (76.7 kg)      Physical Exam Vitals reviewed.  Constitutional:      Appearance: Normal appearance.  Cardiovascular:     Rate and Rhythm: Normal rate and regular rhythm.  Pulmonary:     Effort: Pulmonary effort is normal.     Breath sounds: No wheezing or rales.  Abdominal:     Comments: Normal bowel sounds.  Abdomen slightly distended.  Soft with no localizing tenderness.  Musculoskeletal:     Comments: Only trace pitting edema ankles and lower legs bilaterally.  Neurological:     Mental Status: She is alert.      Results for orders placed or performed in visit on 08/09/22  Ammonia  Result Value Ref Range   Ammonia 57 (H) 11 - 35 umol/L  IBC + Ferritin  Result Value Ref Range   Iron 64 42 - 145 ug/dL   Transferrin 261.0 212.0 - 360.0 mg/dL   Saturation Ratios 17.5 (L) 20.0 - 50.0 %   Ferritin 114.4 10.0 - 291.0 ng/mL   TIBC 365.4 250.0 - 450.0 mcg/dL  Comp Met (CMET)  Result Value Ref Range   Sodium 136 135 - 145 mEq/L   Potassium 4.3 3.5 - 5.1 mEq/L   Chloride 101 96 - 112 mEq/L   CO2 24 19 - 32 mEq/L   Glucose, Bld 111 (H) 70 - 99 mg/dL   BUN 15 6 - 23 mg/dL   Creatinine, Ser 0.75 0.40 - 1.20 mg/dL   Total Bilirubin 1.9 (H) 0.2 - 1.2 mg/dL   Alkaline Phosphatase 94 39 - 117 U/L   AST 45 (H) 0 - 37 U/L   ALT 55 (H) 0 - 35 U/L   Total Protein 6.4 6.0 - 8.3 g/dL   Albumin 3.3 (L) 3.5 - 5.2 g/dL   GFR 79.23 >60.00 mL/min   Calcium 9.2 8.4 - 10.5 mg/dL  CBC with Differential/Platelet  Result Value Ref Range   WBC 4.4 4.0 - 10.5 K/uL   RBC 3.73 (L) 3.87 - 5.11 Mil/uL   Hemoglobin 10.8 (L) 12.0 - 15.0 g/dL   HCT 31.8 (L) 36.0 - 46.0 %   MCV 85.2 78.0 - 100.0 fl   MCHC 33.9 30.0 - 36.0 g/dL   RDW 28.0 (H) 11.5 - 15.5 %   Platelets 75.0 (L) 150.0 - 400.0 K/uL   Neutrophils Relative % 64.9 43.0 - 77.0 %    Lymphocytes Relative 21.6 12.0 - 46.0 %   Monocytes Relative 10.7 3.0 - 12.0 %   Eosinophils Relative 2.3 0.0 - 5.0 %   Basophils Relative 0.5 0.0 - 3.0 %   Neutro Abs 2.8  1.4 - 7.7 K/uL   Lymphs Abs 0.9 0.7 - 4.0 K/uL   Monocytes Absolute 0.5 0.1 - 1.0 K/uL   Eosinophils Absolute 0.1 0.0 - 0.7 K/uL   Basophils Absolute 0.0 0.0 - 0.1 K/uL    Last CBC Lab Results  Component Value Date   WBC 4.4 08/09/2022   HGB 10.8 (L) 08/09/2022   HCT 31.8 (L) 08/09/2022   MCV 85.2 08/09/2022   MCH 27.7 08/01/2022   RDW 28.0 (H) 08/09/2022   PLT 75.0 (L) 31/43/8887   Last metabolic panel Lab Results  Component Value Date   GLUCOSE 111 (H) 08/09/2022   NA 136 08/09/2022   K 4.3 08/09/2022   CL 101 08/09/2022   CO2 24 08/09/2022   BUN 15 08/09/2022   CREATININE 0.75 08/09/2022   GFRNONAA >60 08/01/2022   CALCIUM 9.2 08/09/2022   PHOS 3.8 05/10/2022   PROT 6.4 08/09/2022   ALBUMIN 3.3 (L) 08/09/2022   LABGLOB 3.2 06/28/2022   AGRATIO 1.0 06/28/2022   BILITOT 1.9 (H) 08/09/2022   ALKPHOS 94 08/09/2022   AST 45 (H) 08/09/2022   ALT 55 (H) 08/09/2022   ANIONGAP 6 08/01/2022   Last hemoglobin A1c Lab Results  Component Value Date   HGBA1C 6.8 (A) 06/24/2022   Last thyroid functions Lab Results  Component Value Date   TSH 2.736 06/28/2022      The 10-year ASCVD risk score (Arnett DK, et al., 2019) is: 26.3%    Assessment & Plan:   #1 hyponatremia-question hypovolemic.  She had recent sodium 120.  Improved to 130 at discharge and on labs earlier today further improved to 136.  Patient had diuretics held at discharge and unfortunately appears to be heading towards some volume overload at this time.  She has had substantial weight gain since discharge and apparently about 6 pounds of weight gain since last week.  She has had some abdominal distention.  Renal function stable by labs this morning  -Start back Aldactone 50 mg once daily -Try holding furosemide for now -Recommend  daily weights -labs were done this morning as above -Set up 2-week follow-up and reassess basic metabolic panel once more  #2 type 2 diabetes.  Longstanding history of poor control.  Followed by endocrinology.  #3 nonalcoholic fatty liver disease with cirrhosis.  Continue close follow-up with GI.  Recent labs as above stable.  Return in about 2 weeks (around 08/23/2022).    Carolann Littler, MD

## 2022-08-09 NOTE — Telephone Encounter (Signed)
-----   Message from Levin Erp, [place] sent at 08/05/2022  1:22 PM EDT ----- Regarding: RE: Recommended EUS I think we should wait and see how her hyponatremia plays out, that is a good idea.   can you schedule her for a follow-up MR CP in 3 months.  Let her know the plan.  Thanks, JL L ----- Message ----- From: Irving Copas., MD Sent: 08/05/2022  12:00 PM EDT To: Timothy Lasso, RN; [place]one [place], [place]; # Subject: RE: Recommended EUS                            JZ, Agree that with overall size of the lesion and endoscopic ultrasound seems reasonable with attempt at potential aspiration.  If she is in relatively stable and good health then we can certainly work on getting that scheduled in the coming months.  If you feel she is not stable enough from her hyponatremia standpoint and diuretic needs, I would favor a repeat MRI/MRCP in 3 months and then plan EUS from there.  I would not want her to get scheduled and then be canceled if hyponatremia issues are playing a role.  Let us know so Chong Sicilian can work on scheduling as able. Thanks. GM ----- Message ----- From: Levin Erp, PA Sent: 08/05/2022   9:20 AM EDT To: Irving Copas., MD; # Subject: Recommended EUS                                Dr. Bryan Lemma recommends patient have an EUS procedure for further evaluation of the cysts seen on recent imaging.  You can either get this scheduled with Dr. Rush Landmark when he is available or if it will be a while maybe she needs a follow-up with him to discuss the procedure first.  Thanks, JL L ----- Message ----- From: Lavena Bullion, DO Sent: 08/04/2022   9:59 PM EDT To: Levin Erp, PA     ----- Message ----- From: Levin Erp, PA Sent: 08/04/2022  10:41 AM EDT To: Lavena Bullion, DO  Due to Dr. Ardis Hughs abscence

## 2022-08-09 NOTE — Patient Instructions (Signed)
Start back the Spironolactone 50 mg once daily.  Set up 2 week follow up.

## 2022-08-10 ENCOUNTER — Ambulatory Visit (INDEPENDENT_AMBULATORY_CARE_PROVIDER_SITE_OTHER): Payer: Medicare PPO

## 2022-08-10 VITALS — Ht 63.0 in | Wt 178.0 lb

## 2022-08-10 DIAGNOSIS — Z0000 Encounter for general adult medical examination without abnormal findings: Secondary | ICD-10-CM | POA: Diagnosis not present

## 2022-08-10 NOTE — Patient Instructions (Addendum)
[name] , Thank you for taking time to come for your Medicare Wellness Visit. I appreciate your ongoing commitment to your health goals. Please review the following plan we discussed and let me know if I can assist you in the future.   Screening recommendations/referrals: Colonoscopy: Done 11/16/15 Repeat 5 yrs Mammogram: Done 09/02/21 Repeat 1 yr Bone Density: Done Repeat 1 Yr Recommended yearly ophthalmology/optometry visit for glaucoma screening and checkup Recommended yearly dental visit for hygiene and checkup  Vaccinations: Influenza vaccine: Up to date Pneumococcal vaccine: Up to date Tdap vaccine: Due Shingles vaccine: Done   Covid-19:Done  Advanced directives: Please bring a copy of your health care power of attorney and living will to the office to be added to your chart at your convenience.   Conditions/risks identified: None  Next appointment: Follow up in one year for your annual wellness visit     Preventive Care 65 Years and Older, Female Preventive care refers to lifestyle choices and visits with your health care provider that can promote health and wellness. What does preventive care include? A yearly physical exam. This is also called an annual well check. Dental exams once or twice a year. Routine eye exams. Ask your health care provider how often you should have your eyes checked. Personal lifestyle choices, including: Daily care of your teeth and gums. Regular physical activity. Eating a healthy diet. Avoiding tobacco and drug use. Limiting alcohol use. Practicing safe sex. Taking low-dose aspirin every day. Taking vitamin and mineral supplements as recommended by your health care provider. What happens during an annual well check? The services and screenings done by your health care provider during your annual well check will depend on your age, overall health, lifestyle risk factors, and family history of disease. Counseling  Your health care  provider may ask you questions about your: Alcohol use. Tobacco use. Drug use. Emotional well-being. Home and relationship well-being. Sexual activity. Eating habits. History of falls. Memory and ability to understand (cognition). Work and work Statistician. Reproductive health. Screening  You may have the following tests or measurements: Height, weight, and BMI. Blood pressure. Lipid and cholesterol levels. These may be checked every 5 years, or more frequently if you are over 37 years old. Skin check. Lung cancer screening. You may have this screening every year starting at age 80 if you have a 30-pack-year history of smoking and currently smoke or have quit within the past 15 years. Fecal occult blood test (FOBT) of the stool. You may have this test every year starting at age 54. Flexible sigmoidoscopy or colonoscopy. You may have a sigmoidoscopy every 5 years or a colonoscopy every 10 years starting at age 21. Hepatitis C blood test. Hepatitis B blood test. Sexually transmitted disease (STD) testing. Diabetes screening. This is done by checking your blood sugar (glucose) after you have not eaten for a while (fasting). You may have this done every 1-3 years. Bone density scan. This is done to screen for osteoporosis. You may have this done starting at age 41. Mammogram. This may be done every 1-2 years. Talk to your health care provider about how often you should have regular mammograms. Talk with your health care provider about your test results, treatment options, and if necessary, the need for more tests. Vaccines  Your health care provider may recommend certain vaccines, such as: Influenza vaccine. This is recommended every year. Tetanus, diphtheria, and acellular pertussis (Tdap, Td) vaccine. You may need a Td booster every 10 years. Zoster vaccine.  You may need this after age 71. Pneumococcal 13-valent conjugate (PCV13) vaccine. One dose is recommended after age  30. Pneumococcal polysaccharide (PPSV23) vaccine. One dose is recommended after age 44. Talk to your health care provider about which screenings and vaccines you need and how often you need them. This information is not intended to replace advice given to you by your health care provider. Make sure you discuss any questions you have with your health care provider. Document Released: 12/18/2015 Document Revised: 08/10/2016 Document Reviewed: 09/22/2015 Elsevier Interactive Patient Education  2017 [place] Prevention in the Home Falls can cause injuries. They can happen to people of all ages. There are many things you can do to make your home safe and to help prevent falls. What can I do on the outside of my home? Regularly fix the edges of walkways and driveways and fix any cracks. Remove anything that might make you trip as you walk through a door, such as a raised step or threshold. Trim any bushes or trees on the path to your home. Use bright outdoor lighting. Clear any walking paths of anything that might make someone trip, such as rocks or tools. Regularly check to see if handrails are loose or broken. Make sure that both sides of any steps have handrails. Any raised decks and porches should have guardrails on the edges. Have any leaves, snow, or ice cleared regularly. Use sand or salt on walking paths during winter. Clean up any spills in your garage right away. This includes oil or grease spills. What can I do in the bathroom? Use night lights. Install grab bars by the toilet and in the tub and shower. Do not use towel bars as grab bars. Use non-skid mats or decals in the tub or shower. If you need to sit down in the shower, use a plastic, non-slip stool. Keep the floor dry. Clean up any water that spills on the floor as soon as it happens. Remove soap buildup in the tub or shower regularly. Attach bath mats securely with double-sided non-slip rug tape. Do not have throw  rugs and other things on the floor that can make you trip. What can I do in the bedroom? Use night lights. Make sure that you have a light by your bed that is easy to reach. Do not use any sheets or blankets that are too big for your bed. They should not hang down onto the floor. Have a firm chair that has side arms. You can use this for support while you get dressed. Do not have throw rugs and other things on the floor that can make you trip. What can I do in the kitchen? Clean up any spills right away. Avoid walking on wet floors. Keep items that you use a lot in easy-to-reach places. If you need to reach something above you, use a strong step stool that has a grab bar. Keep electrical cords out of the way. Do not use floor polish or wax that makes floors slippery. If you must use wax, use non-skid floor wax. Do not have throw rugs and other things on the floor that can make you trip. What can I do with my stairs? Do not leave any items on the stairs. Make sure that there are handrails on both sides of the stairs and use them. Fix handrails that are broken or loose. Make sure that handrails are as long as the stairways. Check any carpeting to make sure that it is  firmly attached to the stairs. Fix any carpet that is loose or worn. Avoid having throw rugs at the top or bottom of the stairs. If you do have throw rugs, attach them to the floor with carpet tape. Make sure that you have a light switch at the top of the stairs and the bottom of the stairs. If you do not have them, ask someone to add them for you. What else can I do to help prevent falls? Wear shoes that: Do not have high heels. Have rubber bottoms. Are comfortable and fit you well. Are closed at the toe. Do not wear sandals. If you use a stepladder: Make sure that it is fully opened. Do not climb a closed stepladder. Make sure that both sides of the stepladder are locked into place. Ask someone to hold it for you, if  possible. Clearly mark and make sure that you can see: Any grab bars or handrails. First and last steps. Where the edge of each step is. Use tools that help you move around (mobility aids) if they are needed. These include: Canes. Walkers. Scooters. Crutches. Turn on the lights when you go into a dark area. Replace any light bulbs as soon as they burn out. Set up your furniture so you have a clear path. Avoid moving your furniture around. If any of your floors are uneven, fix them. If there are any pets around you, be aware of where they are. Review your medicines with your doctor. Some medicines can make you feel dizzy. This can increase your chance of falling. Ask your doctor what other things that you can do to help prevent falls. This information is not intended to replace advice given to you by your health care provider. Make sure you discuss any questions you have with your health care provider. Document Released: 09/17/2009 Document Revised: 04/28/2016 Document Reviewed: 12/26/2014 Elsevier Interactive Patient Education  2017 [employer].

## 2022-08-10 NOTE — Progress Notes (Signed)
Subjective:   Dana Dyer is a 71 y.o. female who presents for Medicare Annual (Subsequent) preventive examination.  Review of Systems    Virtual Visit via Telephone Note  I connected with  Dana Dyer on 08/10/22 at  9:45 AM EDT by telephone and verified that I am speaking with the correct person using two identifiers.  Location: Patient: Home Provider: Office Persons participating in the virtual visit: patient/Nurse Health Advisor   I discussed the limitations, risks, security and privacy concerns of performing an evaluation and management service by telephone and the availability of in person appointments. The patient expressed understanding and agreed to proceed.  Interactive audio and video telecommunications were attempted between this nurse and patient, however failed, due to patient having technical difficulties OR patient did not have access to video capability.  We continued and completed visit with audio only.  Some vital signs may be absent or patient reported.   Dana Peaches, LPN  Cardiac Risk Factors include: advanced age (>31mn, >>73women);diabetes mellitus;hypertension     Objective:    Today's Vitals   08/10/22 1005  Weight: 178 lb (80.7 kg)  Height: 5' 3"  (1.6 m)   Body mass index is 31.53 kg/m.     08/10/2022   10:12 AM 07/29/2022    4:16 PM 06/28/2022   11:56 AM 08/04/2021    9:13 AM 07/12/2021    2:45 PM 09/01/2020   10:14 AM 07/25/2018   11:50 AM  Advanced Directives  Does Patient Have a Medical Advance Directive? Yes No Yes Yes Yes Yes No  Type of AParamedicof [place] will   [place] will  [place] will   Does patient want to make changes to medical advance directive?      No - Patient declined   Copy of [place] Chart? No - copy requested   No - copy requested  No - copy requested   Would patient like information on creating a  medical advance directive?  No - Patient declined         Current Medications (verified) Outpatient Encounter Medications as of 08/10/2022  Medication Sig   BD PEN NEEDLE NANO 2ND GEN 32G X 4 MM MISC USE AS DIRECTED   clobetasol cream (TEMOVATE) 02.99% Apply 1 Application topically 2 (two) times daily as needed (yeast infection).   Continuous Blood Gluc Sensor (DEXCOM G7 SENSOR) MISC 1 Device by Does not apply route as directed. Every 10 days   escitalopram (LEXAPRO) 10 MG tablet TAKE 1 TABLET BY MOUTH EVERY DAY (Patient taking differently: Take 10 mg by mouth every morning.)   insulin glargine (LANTUS SOLOSTAR) 100 UNIT/ML Solostar Pen Inject 30 Units into the skin at bedtime.   insulin lispro (HUMALOG KWIKPEN) 200 UNIT/ML KwikPen Inject 16-21 Units into the skin 3 (three) times daily with meals. Dose is based on CBG   lactulose (CHRONULAC) 10 GM/15ML solution Take 30 mLs (20 g total) by mouth 3 (three) times daily.   metFORMIN (GLUCOPHAGE) 1000 MG tablet TAKE 1 TABLET BY MOUTH TWICE A DAY (Patient taking differently: Take 1,000 mg by mouth 2 (two) times daily.)   omeprazole (PRILOSEC) 20 MG capsule Take 20 mg by mouth at bedtime.   sodium chloride 1 g tablet Take 1 tablet (1 g total) by mouth 3 (three) times daily with meals.   spironolactone (ALDACTONE) 50 MG tablet Take 50 mg by mouth daily.  No facility-administered encounter medications on file as of 08/10/2022.    Allergies (verified) Amoxicillin-pot clavulanate, Codeine sulfate, Diflucan [fluconazole], Epinephrine, Erythromycin base, Invokana [canagliflozin], Irbesartan-hydrochlorothiazide, Victoza [liraglutide], Latex, and Ozempic (0.25 or 0.5 mg-dose) [semaglutide(0.25 or 0.13m-dos)]   History: Past Medical History:  Diagnosis Date   ALLERGIC RHINITIS 10/09/2007   Allergy    Arthritis    hips, spine   DIABETES MELLITUS, UNCONTROLLED 03/16/2009   FATIGUE 10/21/2008   FIBROMYALGIA 10/09/2007   GERD (gastroesophageal reflux  disease)    diet  controlled, no meds   H/O vaginal hysterectomy    HYPERLIPIDEMIA 10/09/2007   diet controlled, no meds   HYPERTENSION 10/09/2007   Morton's neuroma    OBSTRUCTIVE SLEEP APNEA 10/09/2007   Palpitations    Plantar fasciitis    History - right   Sleep apnea    Uses CPAP   TRANSAMINASES, SERUM, ELEVATED 07/08/2009   Past Surgical History:  Procedure Laterality Date   ABDOMINAL HYSTERECTOMY  1979   prolapsed uterus   BASAL CELL CARCINOMA EXCISION     BREAST BIOPSY Left 2012   left 2012 stereo   BREAST REDUCTION SURGERY Bilateral 1992   COLONOSCOPY  2006   [place]   mamoplasty     reduction   REDU[place] M[place]LASTY [place]  Family History  Problem Relation Age of Onset   Diabetes Mother    Arthritis Mother    Cancer Mother        breast   Breast cancer Mother 770  Alcohol abuse Father    Arthritis Father    Hypertension Maternal Grandfather    Miscarriages / Stillbirths Paternal Grandfather    Breast cancer Maternal Aunt        diagnosed in her 767's  Colon polyps Neg Hx    Colon cancer Neg Hx    Esophageal cancer Neg Hx    Rectal cancer Neg Hx    Stomach cancer Neg Hx    Social History   Socioeconomic History   Marital status: Married    Spouse name: Not on file   Number of children: Not on file   Years of education: Not on file   Highest education level: Not on file  Occupational History   Not on file  Tobacco Use   Smoking status: Never   Smokeless tobacco: Never  Vaping Use   Vaping Use: Never used  Substance and Sexual Activity   Alcohol use: No    Alcohol/week: 0.0 standard drinks of alcohol   Drug use: No   Sexual activity: Yes    Birth control/protection: Other-see comments, Post-menopausal    Comment: Hysterectomy  Other Topics Concern   Not on file  Social History Narrative   Not on file   Social Determinants of Health   Financial Resource Strain: Low Risk   (08/10/2022)   Overall Financial Resource Strain (CARDIA)    Difficulty of Paying Living Expenses: Not hard at all  Food Insecurity: No Food Insecurity (08/10/2022)   Hunger Vital Sign    Worried About Running Out of Food in the Last Year: Never true    Ran Out of Food in the Last Year: Never true  Transportation Needs: No Transportation Needs (08/10/2022)   PRAPARE - THydrologist(Medical): No    Lack of Transportation (Non-Medical): No  Physical Activity: Inactive (08/10/2022)   Exercise Vital Sign    Days of Exercise  per Week: 0 days    Minutes of Exercise per Session: 0 min  Stress: No Stress Concern Present (08/10/2022)   [place]    Feeling of Stress : Not at all  Social Connections: [place] (08/10/2022)   Social Connection and Isolation Panel [NHANES]    Frequency of Communication with Friends and Family: More than three times a week    Frequency of Social Gatherings with Friends and Family: More than three times a week    Attends Religious Services: More than 4 times per year    Active Member of [employer] or Organizations: Yes    Attends Music therapist: More than 4 times per year    Marital Status: Married    Tobacco Counseling Counseling given: Not Answered   Clinical Intake: Nutrition Risk Assessment:  Has the patient had any N/V/D within the last 2 months?  No  Does the patient have any non-healing wounds?  No  Has the patient had any unintentional weight loss or weight gain?  No   Diabetes:  Is the patient diabetic?  Yes  If diabetic, was a CBG obtained today?  Yes   CBG 80 Taken by patient Did the patient bring in their glucometer from home?  No  How often do you monitor your CBG's? Monitored by Dexcom.   Financial Strains and Diabetes Management:  Are you having any financial strains with the device, your supplies or your medication? No .  Does the  patient want to be seen by Chronic Care Management for management of their diabetes?  No  Would the patient like to be referred to a Nutritionist or for Diabetic Management?  No   Diabetic Exams:  Diabetic Eye Exam: Completed No. Overdue for diabetic eye exam. Pt has been advised about the importance in completing this exam. A referral has been placed today. Message sent to referral coordinator for scheduling purposes. Advised pt to expect a call from office referred to regarding appt.  Diabetic Foot Exam: Completed No. Pt has been advised about the importance in completing this exam. Pt is scheduled for diabetic foot exam on Followed by PCP.   Pre-visit preparation completed: No  Pain : No/denies pain     BMI - recorded: 31.81 Nutritional Status: BMI > 30  Obese Nutritional Risks: None Diabetes: Yes CBG done?: Yes CBG resulted in Enter/ Edit results?: Yes (CBG 80 Taken by patient) Did pt. bring in CBG monitor from home?: No  How often do you need to have someone help you when you read instructions, pamphlets, or other written materials from your doctor or pharmacy?: 2 - Rarely  Diabetic?  Yes  Interpreter Needed?: No  Information entered by :: Rolene Arbour LPN   Activities of Daily Living    08/10/2022   10:11 AM 07/30/2022    4:00 AM  In your present state of health, do you have any difficulty performing the following activities:  Hearing? 0 0  Vision? 0 0  Difficulty concentrating or making decisions? 0 0  Walking or climbing stairs? 0 0  Dressing or bathing? 0 0  Doing errands, shopping? 0 1  Preparing Food and eating ? N   Using the Toilet? N   In the past six months, have you accidently leaked urine? N   Do you have problems with loss of bowel control? N   Managing your Medications? N   Managing your Finances? N   Housekeeping or managing  your Housekeeping? N     Patient Care Team: Eulas Post, MD as PCP - General  Indicate any recent Medical  Services you may have received from other than [facility] providers in the past year (date may be approximate).     Assessment:   This is a routine wellness examination for [place].  Hearing/Vision screen Hearing Screening - Comments:: Denies hearing difficulties   Vision Screening - Comments:: Wears rx glasses - up to date with routine eye exams with  Coralie Keens  Dietary issues and exercise activities discussed: Current Exercise Habits: The patient does not participate in regular exercise at present, Exercise limited by: None identified   Goals Addressed               This Visit's Progress     Patient stated (pt-stated)        I would like just to feel  better/       Depression Screen    08/10/2022   10:09 AM 08/09/2022    2:09 PM 05/10/2022    9:20 AM 08/04/2021    9:15 AM 08/04/2021    9:11 AM 07/12/2021    2:45 PM 10/26/2020    9:22 AM  PHQ 2/9 Scores  PHQ - 2 Score 0 0 1 0 0 0 0  PHQ- 9 Score 0 11 8        Fall Risk    08/10/2022   10:11 AM 07/18/2022    4:21 PM 08/04/2021    9:14 AM 07/12/2021    2:44 PM 10/26/2020    9:22 AM  [place] in the past year? 0 0 0 0 0  Number falls in past yr: 0 0 0    Injury with Fall? 0 0 0    Risk for fall due to : No Fall Risks No Fall Risks No Fall Risks    Follow up Falls prevention discussed Falls evaluation completed Falls evaluation completed      [place]:  Any stairs in or around the home? Yes  If so, are there any without handrails? No  Home free of loose throw rugs in walkways, pet beds, electrical cords, etc? Yes  Adequate lighting in your home to reduce risk of falls? Yes   ASSISTIVE DEVICES UTILIZED TO PREVENT FALLS:  Life alert? No  Use of a cane, walker or w/c? No  Grab bars in the bathroom? No  Shower chair or bench in shower? No  Elevated toilet seat or a handicapped toilet? Yes   TIMED UP AND GO:  Was the test performed? No . Audio Visit   Cognitive Function:         08/10/2022   10:13 AM  6CIT Screen  What Year? 0 points  What month? 0 points  What time? 0 points  Count back from 20 0 points  Months in reverse 0 points  Repeat phrase 0 points  Total Score 0 points    Immunizations Immunization History  Administered Date(s) Administered   Fluad Quad(high Dose 65+) 08/27/2019, 09/21/2021   Hep A / Hep B 12/10/2018, 12/10/2018, 12/18/2018, 12/18/2018, 12/31/2018, 12/31/2018, 01/07/2020   Influenza Split 12/15/2011, 09/25/2012   Influenza Whole 08/30/2010   Influenza, High Dose Seasonal PF 09/01/2015, 09/19/2016, 07/31/2017, 09/27/2018   Influenza,inj,Quad PF,6+ Mos 08/06/2013, 09/22/2014   Moderna Sars-Covid-2 Vaccination 01/18/2020, 02/15/2020, 08/05/2020, 04/12/2021   Pfizer Covid-19 Vaccine Bivalent Booster 51yr & up 08/27/2021   Pneumococcal Conjugate-13 12/08/2011, 09/01/2015  Pneumococcal Polysaccharide-23 10/04/2016   Td 12/05/1998   Tdap 12/15/2011   Zoster Recombinat (Shingrix) 04/05/2022   Zoster, Live 01/24/2013    TDAP status: Due, Education has been provided regarding the importance of this vaccine. Advised may receive this vaccine at local pharmacy or Health Dept. Aware to provide a copy of the vaccination record if obtained from local pharmacy or Health Dept. Verbalized acceptance and understanding.  Flu Vaccine status: Up to date  Pneumococcal vaccine status: Up to date  Covid-19 vaccine status: Completed vaccines  Qualifies for Shingles Vaccine? Yes   Zostavax completed Yes   Shingrix Completed?: Yes  Screening Tests Health Maintenance  Topic Date Due   FOOT EXAM  05/29/2020   OPHTHALMOLOGY EXAM  09/03/2021   URINE MICROALBUMIN  09/30/2022   COVID-19 Vaccine (6 - Moderna risk series) 08/26/2022 (Originally 10/22/2021)   Zoster Vaccines- Shingrix (2 of 2) 11/09/2022 (Originally 05/31/2022)   INFLUENZA VACCINE  03/05/2023 (Originally 07/05/2022)   TETANUS/TDAP  08/11/2023 (Originally 12/14/2021)   HEMOGLOBIN  A1C  12/25/2022   MAMMOGRAM  09/03/2023   COLONOSCOPY (Pts 45-67yr Insurance coverage will need to be confirmed)  03/31/2027   Pneumonia Vaccine 71 Years old  Completed   DEXA SCAN  Completed   HPV VACCINES  Aged Out    Health Maintenance  Health Maintenance Due  Topic Date Due   FOOT EXAM  05/29/2020   OPHTHALMOLOGY EXAM  09/03/2021   URINE MICROALBUMIN  09/30/2022    Colorectal cancer screening: Type of screening: Colonoscopy. Completed 11/16/15. Repeat every 5 years  Mammogram status: Completed 09/02/21. Repeat every year  Bone Density status: Completed 02/27/13. Results reflect: Bone density results: NORMAL. Repeat every   years.  Lung Cancer Screening: (Low Dose CT Chest recommended if Age 73-80years, 30 pack-year currently smoking OR have quit w/in 15years.) does not qualify.     Additional Screening:  Hepatitis C Screening: does qualify; Completed   Vision Screening: Recommended annual ophthalmology exams for early detection of glaucoma and other disorders of the eye. Is the patient up to date with their annual eye exam?  Yes  Who is the provider or what is the name of the office in which the patient attends annual eye exams[facility] provider, would they like to be referred to a provider to establish care? No .   Dental Screening: Recommended annual dental exams for proper oral hygiene  Community Resource Referral / Chronic Care Management:  CRR required this visit?  No   CCM required this visit?  No      Plan:     I have personally reviewed and noted the following in the patient's chart:   Medical and social history Use of alcohol, tobacco or illicit drugs  Current medications and supplements including opioid prescriptions. Patient is not currently taking opioid prescriptions. Functional ability and status Nutritional status Physical activity Advanced directives List of other physicians Hospitalizations,  surgeries, and ER visits in previous 12 months Vitals Screenings to include cognitive, depression, and falls Referrals and appointments  In addition, I have reviewed and discussed with patient certain preventive protocols, quality metrics, and best practice recommendations. A written personalized care plan for preventive services as well as general preventive health recommendations were provided to patient.     BCriselda Peaches LPN   [id number]  Nurse Notes: None

## 2022-08-12 ENCOUNTER — Telehealth: Payer: Self-pay | Admitting: Family Medicine

## 2022-08-12 MED ORDER — FUROSEMIDE 20 MG PO TABS: 20.0000 mg | ORAL_TABLET | Freq: Every day | ORAL | 3 refills | Status: DC

## 2022-08-12 NOTE — Telephone Encounter (Signed)
Having trouble with fluid build up, she has reached 179.5 and was told to call provider if she reached 180

## 2022-08-12 NOTE — Telephone Encounter (Signed)
I spoke with the patient and she states that when she was hospitalized on 07/29/2022 MD at hospital told her that Furosemide was what had caused her sodium levels to become low. Patient inquired if this rx was still ok for her to take?

## 2022-08-12 NOTE — Telephone Encounter (Signed)
Patient informed of the message and verbalized understanding, RX sent  

## 2022-08-15 ENCOUNTER — Telehealth: Payer: Self-pay

## 2022-08-15 ENCOUNTER — Ambulatory Visit (HOSPITAL_COMMUNITY)
Admission: RE | Admit: 2022-08-15 | Discharge: 2022-08-15 | Disposition: A | Discharge status: Home / Self Care | Payer: Medicare PPO | Source: Ambulatory Visit | Attending: Gastroenterology | Admitting: Gastroenterology

## 2022-08-15 DIAGNOSIS — K828 Other specified diseases of gallbladder: Secondary | ICD-10-CM | POA: Diagnosis not present

## 2022-08-15 DIAGNOSIS — K7460 Unspecified cirrhosis of liver: Secondary | ICD-10-CM | POA: Insufficient documentation

## 2022-08-15 NOTE — Telephone Encounter (Signed)
-----   Message from Levin Erp, [place] sent at 08/12/2022  4:44 PM EDT ----- Regarding: FW: Diuretics See Dr. Lurline Del recs.  If she is not having any fluid accumulation we will just continue to hold diuretics.  I think repeat ultrasound is already ordered?,  Should be done next week.  We can then decide what to do from there.  Thanks, JLL ----- Message ----- From: Lavena Bullion, DO Sent: 08/12/2022   3:44 PM EDT To: Levin Erp, PA Subject: RE: Diuretics                                  Sodium looks much improved at 136, with preserved renal function.  Most recent CT as inpatient on 8/25 with small volume perihepatic ascites.  This was presumably done off diuretics since it now appears her diuretics were held prior to hospitalization (CT was on date of admission).  Therefore, unclear that the hyponatremia was due to diuretics vs hypovolemia, infection, etc.  Ultrasound on 06/09/2022 similarly with trace perihepatic ascites, but presumably this would be in the setting of taking diuretics.  Did have issue with hyponatremia in 01/2022, with NA 125.  Stopped Lasix and continued on Aldactone monotherapy with slight improvement to 127.  Lasix was then resumed in March at 20 mg every other day along with Aldactone 50 mg every other day.  This resulted in a very nice improvement in April (NA 134).  If she is again accumulating fluid, would start with low-dose Aldactone 25 mg every other day, then can hopefully start with Lasix 10 mg qod, as she seems quite sensitive to diuretics electrolyte standpoint.  If no clinical fluid accumulation, then could be reasonable for a period of observation and repeat ultrasound next week as scheduled.  If we have continued issues with volume management, may benefit from referral to Nephrology for assistance.  ----- Message ----- From: Levin Erp, PA Sent: 08/10/2022   8:39 AM EDT To: Lavena Bullion, DO Subject: Diuretics                                       So it seems that her cardiologist stopped her diuretics even before her recent hospitalization.  Everything looks good with labs but she was not experiencing a lot of fluid overload in clinic.  Should we just hold off on diuretics?  Have her call if she notices any swelling/distention?  Just a little nervous to put her back on if she was having hyponatremia.  Thanks, JLL ----- Message ----- From: Interface, [facility] Sent: 08/09/2022  11:28 AM EDT To: Levin Erp, PA

## 2022-08-16 ENCOUNTER — Telehealth: Payer: Self-pay

## 2022-08-16 ENCOUNTER — Other Ambulatory Visit: Payer: Self-pay

## 2022-08-16 DIAGNOSIS — E871 Hypo-osmolality and hyponatremia: Secondary | ICD-10-CM

## 2022-08-16 DIAGNOSIS — K7460 Unspecified cirrhosis of liver: Secondary | ICD-10-CM

## 2022-08-16 MED ORDER — FUROSEMIDE 10 MG/ML PO SOLN: 10.0000 mg | ORAL | 2 refills | Status: DC

## 2022-08-16 MED ORDER — SPIRONOLACTONE 25 MG PO TABS: 25.0000 mg | ORAL_TABLET | ORAL | 2 refills | Status: DC

## 2022-08-16 NOTE — Telephone Encounter (Signed)
Gave pt results and recommendations. Medications sent to pt's pharmacy. BMP order and reminder placed. Pt verbalized understanding and had no other concerns at end of call.

## 2022-08-16 NOTE — Telephone Encounter (Signed)
-----   Message from Lavena Bullion, DO sent at 08/15/2022  5:04 PM EDT ----- Regarding: FW: Your new patient? Repeat ultrasound reviewed and notable for small to moderate ascites.  This is presumably due to holding all diuretics.  Additionally, GB wall thickening noted, but this can be seen in the setting of chronic liver disease.  If RUQ pain or concern for cholecystitis, would plan for HIDA scan.  With regard to ascites, she has been somewhat medication intolerant due to hyponatremia.  Can hopefully get her on a regimen that works.  Plan for the following: - Start Aldactone 25 mg QOD - Start Lasix 10 mg QOD (to take these on alternating days from the Aldactone) - BMP check in 7 days - Depending on clinical response, may need to either send for paracentesis and/or referral to Nephrology - Keep follow-up appointment with me next week as scheduled  ----- Message ----- From: Irving Copas., MD Sent: 08/15/2022   4:21 PM EDT To: Lavena Bullion, DO Subject: Your new patient?                              VC, I saw that this came into [place] inbox but looks like you are following the patient now. I am forwarding this to you. Looks like new ascites has developed. May need a tap. Thanks. GM ----- Message ----- From: Interface, Rad Results In Sent: 08/15/2022   1:42 PM EDT To: Milus Banister, MD

## 2022-08-18 ENCOUNTER — Ambulatory Visit: Payer: Medicare PPO | Admitting: Cardiovascular Disease

## 2022-08-18 NOTE — Telephone Encounter (Signed)
This has been addressed. See 08/16/22 telephone encounter for details.

## 2022-08-19 ENCOUNTER — Encounter: Payer: Self-pay | Admitting: Cardiovascular Disease

## 2022-08-19 ENCOUNTER — Ambulatory Visit: Payer: Medicare PPO | Attending: Cardiovascular Disease | Admitting: Cardiovascular Disease

## 2022-08-19 VITALS — BP 126/60 | HR 105 | Ht 63.0 in | Wt 177.8 lb

## 2022-08-19 DIAGNOSIS — I10 Essential (primary) hypertension: Secondary | ICD-10-CM

## 2022-08-19 DIAGNOSIS — K7581 Nonalcoholic steatohepatitis (NASH): Secondary | ICD-10-CM

## 2022-08-19 DIAGNOSIS — R0609 Other forms of dyspnea: Secondary | ICD-10-CM | POA: Diagnosis not present

## 2022-08-19 MED ORDER — METOPROLOL TARTRATE 25 MG PO TABS: 12.5000 mg | ORAL_TABLET | Freq: Two times a day (BID) | ORAL | 3 refills | Status: DC

## 2022-08-19 NOTE — Progress Notes (Signed)
Cardiology Office Note:    Date:  08/19/2022   ID:  SUNYA HUMBARGER, DOB Jun 18, 1951, MRN [id number]  PCP:  Eulas Post, MD   [facility] Providers Cardiologist:    }    Referring MD: Eulas Post, MD   Chief Complaint  Patient presents with   Shortness of Breath    History of Present Illness:    Dana Dyer is a 71 y.o. female with a hx of HTN, , DM Has been told she is dehydrated by her rheumatologist  Now is here with hypotension episodes   Has NASH Is on Aldactone 50 for her cirrhosis  Is also on Losartan 100 ( for her DM)  Amlodipine 5 mg a day  No CP or dyspnea  with exertion  Gets tired in her arms when she walks up hill  No chest tightness, no intrascapular pain  Does have presyncope with exertion  No real symptoms at rest.   Has had DM for ~ 20 years  Some of her symptoms are suggestive of CAD ( Exertional arm fatigue, dyspnea   Aug. 14, 2023  Seen with daughter, [name] is seen for follow up of her HTN and orthostatic hypotension Coronary CT angiogram shows a coronary calcium score of 0.  He has a moderate stenosis in the mid LAD and a moderate to severe stenosis and a very small first diagonal branch.  She was incidentally noted to have some thickening in his esophagus. Is limited to 24 oz of water intake a day   Sept. 15, 2023: [name] is seen for follow up of her DOE  Coronary CT angiogram from July 14, 2022 reveals mild to moderate mid LAD disease.  Coronary calcium score 0.  Breathing is ok on flat land,  gets DOE with walking up a hill    She has a fine tremor   LVEF is normal by echo in July 2023.  She has mild aortic stenosis.  Trivial mitral regurgitation  Has had hyponatremia ( had been started on lasix which likely was contributing to her hyponatremia )     Past Medical History:  Diagnosis Date   ALLERGIC RHINITIS 10/09/2007   Allergy    Arthritis    hips, spine   DIABETES MELLITUS, UNCONTROLLED  03/16/2009   FATIGUE 10/21/2008   FIBROMYALGIA 10/09/2007   GERD (gastroesophageal reflux disease)    diet  controlled, no meds   H/O vaginal hysterectomy    HYPERLIPIDEMIA 10/09/2007   diet controlled, no meds   HYPERTENSION 10/09/2007   Morton's neuroma    OBSTRUCTIVE SLEEP APNEA 10/09/2007   Palpitations    Plantar fasciitis    History - right   Sleep apnea    Uses CPAP   TRANSAMINASES, SERUM, ELEVATED 07/08/2009    Past Surgical History:  Procedure Laterality Date   ABDOMINAL HYSTERECTOMY  1979   prolapsed uterus   BASAL CELL CARCINOMA EXCISION     BREAST BIOPSY Left 2012   left 2012 stereo   BREAST REDUCTION SURGERY Bilateral 1992   COLONOSCOPY  2006   [place]   mamoplasty     reduction   REDUCTION MAMMAPLASTY Bilateral 1992   TONSILLECTOMY  1959   TUBAL LIGATION  1976    Current Medications: Current Meds  Medication Sig   BD PEN NEEDLE NANO 2ND GEN 32G X 4 MM MISC USE AS DIRECTED   clobetasol cream (TEMOVATE) 2.50 % Apply 1 Application topically 2 (two) times daily as needed (yeast  infection).   Continuous Blood Gluc Sensor (DEXCOM G7 SENSOR) MISC 1 Device by Does not apply route as directed. Every 10 days   escitalopram (LEXAPRO) 10 MG tablet TAKE 1 TABLET BY MOUTH EVERY DAY   furosemide (LASIX) 10 MG/ML solution Take 1 mL (10 mg total) by mouth every other day. Take on alternating days from the Aldactone   insulin glargine (LANTUS SOLOSTAR) 100 UNIT/ML Solostar Pen Inject 30 Units into the skin at bedtime.   insulin lispro (HUMALOG KWIKPEN) 200 UNIT/ML KwikPen Inject 16-21 Units into the skin 3 (three) times daily with meals. Dose is based on CBG   lactulose, encephalopathy, (CHRONULAC) 10 GM/15ML SOLN Take by mouth 3 (three) times daily. 3-4 tablespoons 3 times a day   metFORMIN (GLUCOPHAGE) 1000 MG tablet TAKE 1 TABLET BY MOUTH TWICE A DAY   metoprolol tartrate (LOPRESSOR) 25 MG tablet Take 0.5 tablets (12.5 mg total) by mouth 2 (two) times daily.    omeprazole (PRILOSEC) 20 MG capsule Take 20 mg by mouth at bedtime.   spironolactone (ALDACTONE) 25 MG tablet Take 1 tablet (25 mg total) by mouth every other day. Take on alternating days from the Lasix.   [DISCONTINUED] lactulose (CHRONULAC) 10 GM/15ML solution Take 30 mLs (20 g total) by mouth 3 (three) times daily. (Patient taking differently: Take 20 g by mouth 3 (three) times daily. Patient reports taking three to four tablespoons once daily)     Allergies:   Amoxicillin-pot clavulanate, Codeine sulfate, Diflucan [fluconazole], Epinephrine, Erythromycin base, Invokana [canagliflozin], Irbesartan-hydrochlorothiazide, Victoza [liraglutide], Latex, and Ozempic (0.25 or 0.5 mg-dose) [semaglutide(0.25 or 0.25m-dos)]   Social History   Socioeconomic History   Marital status: Married    Spouse name: Not on file   Number of children: Not on file   Years of education: Not on file   Highest education level: Not on file  Occupational History   Not on file  Tobacco Use   Smoking status: Never   Smokeless tobacco: Never  Vaping Use   Vaping Use: Never used  Substance and Sexual Activity   Alcohol use: No    Alcohol/week: 0.0 standard drinks of alcohol   Drug use: No   Sexual activity: Yes    Birth control/protection: Other-see comments, Post-menopausal    Comment: Hysterectomy  Other Topics Concern   Not on file  Social History Narrative   Not on file   Social Determinants of Health   Financial Resource Strain: Low Risk  (08/10/2022)   Overall Financial Resource Strain (CARDIA)    Difficulty of Paying Living Expenses: Not hard at all  Food Insecurity: No Food Insecurity (08/10/2022)   Hunger Vital Sign    Worried About Running Out of Food in the Last Year: Never true    Ran Out of Food in the Last Year: Never true  Transportation Needs: No Transportation Needs (08/10/2022)   PRAPARE - THydrologist(Medical): No    Lack of Transportation (Non-Medical): No   Physical Activity: Inactive (08/10/2022)   Exercise Vital Sign    Days of Exercise per Week: 0 days    Minutes of Exercise per Session: 0 min  Stress: No Stress Concern Present (08/10/2022)   [place]   Feeling of Stress : Not at all  Social Connections: [place](08/10/2022)   Social Connection and Isolation Panel [NHANES]    Frequency of Communication with Friends and Family: More than three times a week  Frequency of Social Gatherings with Friends and Family: More than three times a week    Attends Religious Services: More than 4 times per year    Active Member of Clubs or Organizations: Yes    Attends Music therapist: More than 4 times per year    Marital Status: Married     Family History: The patient's family history includes Alcohol abuse in her father; Arthritis in her father and mother; Breast cancer in her maternal aunt; Breast cancer (age of onset: 64) in her mother; Cancer in her mother; Diabetes in her mother; Hypertension in her maternal grandfather; Miscarriages / [place] in her paternal grandfather. There is no history of Colon polyps, Colon cancer, Esophageal cancer, Rectal cancer, or Stomach cancer.  ROS:   Please see the history of present illness.     All other systems reviewed and are negative.  EKGs/Labs/Other Studies Reviewed:    The following studies were reviewed today:   EKG:     Recent Labs: 06/28/2022: TSH 2.736 08/01/2022: Magnesium 1.8 08/09/2022: ALT 55; BUN 15; Creatinine, Ser 0.75; Hemoglobin 10.8; Platelets 75.0; Potassium 4.3; Sodium 136  Recent Lipid Panel    Component Value Date/Time   CHOL 154 09/30/2021 1048   TRIG 128.0 09/30/2021 1048   HDL 59.30 09/30/2021 1048   CHOLHDL 3 09/30/2021 1048   VLDL 25.6 09/30/2021 1048   LDLCALC 69 09/30/2021 1048   LDLDIRECT 124.9 10/14/2010 0816     Risk Assessment/Calculations:           Physical  Exam:    Physical Exam: Blood pressure 126/60, pulse (!) 105, height 5' 3"  (1.6 m), weight 177 lb 12.8 oz (80.6 kg), SpO2 97 %.       GEN:  Well nourished, well developed in no acute distress HEENT: Normal NECK: No JVD; No carotid bruits LYMPHATICS: No lymphadenopathy CARDIAC: RRR 2/6 systolic murmur at the left sternal border RESPIRATORY:  Clear to auscultation without rales, wheezing or rhonchi  ABDOMEN: Soft, non-tender, non-distended MUSCULOSKELETAL:  No edema; No deformity  SKIN: Warm and dry NEUROLOGIC:  Alert and oriented x 3    ASSESSMENT:    1. Essential hypertension   2. DOE (dyspnea on exertion)   3. NASH (nonalcoholic steatohepatitis)      PLAN:       Hypotension: Blood pressure is fairly well controlled.  Exercise intolerance: Her heart rate is 105.  We will start her on low-dose metoprolol 12.5 mg twice daily.  This should help control her heart rate.  This might be part of her exercise intolerance.  3.  Mild- mod  aortic stenosis:  will continue to follow up          Medication Adjustments/Labs and Tests Ordered: Current medicines are reviewed at length with the patient today.  Concerns regarding medicines are outlined above.  No orders of the defined types were placed in this encounter.  Meds ordered this encounter  Medications   metoprolol tartrate (LOPRESSOR) 25 MG tablet    Sig: Take 0.5 tablets (12.5 mg total) by mouth 2 (two) times daily.    Dispense:  90 tablet    Refill:  3    Patient Instructions  Medication Instructions:  Your physician has recommended you make the following change in your medication:  1-START metoprolol 12.5 mg by mouth daily.  *If you need a refill on your cardiac medications before your next appointment, please call your pharmacy*  Lab Work: If you have labs (blood work) drawn today  and your tests are completely normal, you will receive your results only by: MyChart Message (if you have MyChart) OR A paper  copy in the mail If you have any lab test that is abnormal or we need to change your treatment, we will call you to review the results.  Follow-Up: At [facility], you and your health needs are our priority.  As part of our continuing mission to provide you with exceptional heart care, we have created designated Provider Care Teams.  These Care Teams include your primary Cardiologist (physician) and Advanced Practice Providers (APPs -  Physician Assistants and Nurse Practitioners) who all work together to provide you with the care you need, when you need it.  We recommend signing up for the patient portal called "MyChart".  Sign up information is provided on this After Visit Summary.  MyChart is used to connect with patients for Virtual Visits (Telemedicine).  Patients are able to view lab/test results, encounter notes, upcoming appointments, etc.  Non-urgent messages can be sent to your provider as well.   To learn more about what you can do with MyChart, go to [email or web address].    Your next appointment:   6 weeks  The format for your next appointment:   In Person  Provider:   Dr. Acie Fredrickson      Important Information About Sugar         Signed, Mertie Moores, MD  08/19/2022 3:31 PM    [place]

## 2022-08-19 NOTE — Patient Instructions (Addendum)
Medication Instructions:  Your physician has recommended you make the following change in your medication:  1-START metoprolol 12.5 mg by mouth daily.  *If you need a refill on your cardiac medications before your next appointment, please call your pharmacy*  Lab Work: If you have labs (blood work) drawn today and your tests are completely normal, you will receive your results only by: [place] (if you have MyChart) OR A paper copy in the mail If you have any lab test that is abnormal or we need to change your treatment, we will call you to review the results.  Follow-Up: At [facility], you and your health needs are our priority.  As part of our continuing mission to provide you with exceptional heart care, we have created designated Provider Care Teams.  These Care Teams include your primary Cardiologist (physician) and Advanced Practice Providers (APPs -  Physician Assistants and Nurse Practitioners) who all work together to provide you with the care you need, when you need it.  We recommend signing up for the patient portal called "MyChart".  Sign up information is provided on this After Visit Summary.  MyChart is used to connect with patients for Virtual Visits (Telemedicine).  Patients are able to view lab/test results, encounter notes, upcoming appointments, etc.  Non-urgent messages can be sent to your provider as well.   To learn more about what you can do with MyChart, go to [email or web address].    Your next appointment:   6 weeks  The format for your next appointment:   In Person  Provider:   Dr. Acie Fredrickson      Important Information About Sugar

## 2022-08-23 ENCOUNTER — Other Ambulatory Visit (INDEPENDENT_AMBULATORY_CARE_PROVIDER_SITE_OTHER): Payer: Medicare PPO

## 2022-08-23 ENCOUNTER — Ambulatory Visit: Payer: Medicare PPO | Admitting: Family Medicine

## 2022-08-23 ENCOUNTER — Telehealth: Payer: Self-pay

## 2022-08-23 DIAGNOSIS — K7460 Unspecified cirrhosis of liver: Secondary | ICD-10-CM

## 2022-08-23 DIAGNOSIS — E871 Hypo-osmolality and hyponatremia: Secondary | ICD-10-CM

## 2022-08-23 LAB — BASIC METABOLIC PANEL
BUN: 10 mg/dL (ref 6–23)
CO2: 29 mEq/L (ref 19–32)
Calcium: 9.4 mg/dL (ref 8.4–10.5)
Chloride: 96 mEq/L (ref 96–112)
Creatinine, Ser: 0.61 mg/dL (ref 0.40–1.20)
GFR: 88.95 mL/min (ref >60.00)
Glucose, Bld: 145 mg/dL — ABNORMAL HIGH (ref 70–99)
Potassium: 4 mEq/L (ref 3.5–5.1)
Sodium: 131 mEq/L — ABNORMAL LOW (ref 135–145)

## 2022-08-23 NOTE — Telephone Encounter (Signed)
-----   Message from Marice Potter, RN sent at 08/16/2022 10:00 AM EDT ----- Pt needs BMP. Order in epic.

## 2022-08-23 NOTE — Telephone Encounter (Signed)
Called pt to let her know about BMP that is due. Pt verbalized understanding and had no other concerns at end of call.

## 2022-08-24 ENCOUNTER — Ambulatory Visit (INDEPENDENT_AMBULATORY_CARE_PROVIDER_SITE_OTHER): Payer: Medicare PPO | Admitting: Gastroenterology

## 2022-08-24 ENCOUNTER — Encounter: Payer: Self-pay | Admitting: Gastroenterology

## 2022-08-24 VITALS — BP 120/82 | HR 87 | Ht 63.0 in | Wt 178.0 lb

## 2022-08-24 DIAGNOSIS — K7460 Unspecified cirrhosis of liver: Secondary | ICD-10-CM | POA: Diagnosis not present

## 2022-08-24 DIAGNOSIS — K7581 Nonalcoholic steatohepatitis (NASH): Secondary | ICD-10-CM | POA: Diagnosis not present

## 2022-08-24 DIAGNOSIS — E871 Hypo-osmolality and hyponatremia: Secondary | ICD-10-CM

## 2022-08-24 DIAGNOSIS — Z860100 Personal history of colon polyps, unspecified: Secondary | ICD-10-CM

## 2022-08-24 DIAGNOSIS — R188 Other ascites: Secondary | ICD-10-CM | POA: Diagnosis not present

## 2022-08-24 DIAGNOSIS — Z8719 Personal history of other diseases of the digestive system: Secondary | ICD-10-CM

## 2022-08-24 NOTE — Progress Notes (Signed)
Chief Complaint:    Cirrhosis  GI History:  1.  History of adenomatous polyps in her colon.  Colonoscopy 2006 Dr. Timmothy Euler found no polyps.  Colonoscopy Dr. Ardis Hughs December 2016 found 7 subcentimeter polyps.  These were all adenomatous polyps on pathology.  Also documented left-sided diverticulosis.  Dr. Ardis Hughs recommended she have a repeat colonoscopy at 3-year interval.   Colonoscopy January 2020 four polyps were removed from her colon, all were adenomatous on pathology.  She was recommended to have repeat colonoscopy at 3-year interval.  Colonoscopy 03/2022 2 subcentimeter adenomas.  A 1 cm ulcer in the cecum was noted this did not appear malignant.  Biopsies showed no neoplasm, suspect from meloxicam she was taken for back pains.  Previous tattoo of colon area was normal.  2. Cirrhosis, likely from longstanding fatty liver disease, out-of-control diabetes.  Diagnosed 2019. - Lab workup 2019: Hep C Ab negative, Hep B Surface Ag negative, hepatitis B surface antibody negative, hep B Core Igm negative, Hep A IgM negative. Ferritin normal, HIV negative, ANA negative, antimitochondrial antibody negative, hepatitis a antibody total negative; slightly low platelets, transaminases slightly elevated.  No edema. - [place] 07/2018: slightly nodular, suspicious for cirrhosis. - [place] 12/2019 cirrhosis without mass lesions. - Ultrasound 08/2020 showed cirrhosis without focal mass lesions. Gallbladder thick but without Murphy sign. - Ultrasound May 2022 showed cirrhosis, trace perihepatic ascites, no focal lesions in the liver. - [place] 12/2021 nodular liver, no masses.  - EGD 12/2018 small distal esophagus varices, mild portal gastropathy, no gastric varices.  She was started on nadolol (eventually at 34m daily). - EGD 03/2022 small esophagus varices, mild portal gastropathy.  Biopsies of minimal gastritis showed no H. pylori - AFP 11/2021 normal - MELD 12 (11/2021 labs) - Hepatitis A, B vaccination series started  11/2018 -11/2021: Mild encephalopathy, memory disturbance, slight tremors, ammonia 65. Started lactulose which might have raised her blood sugars a bit. -07/29/2022 - 08/01/2022: Hospital admission for hyponatremia 2/2 hypovolemia.  NA 120 on admission, improved to 130s by DC.  Discharged home with salt tablets to hold diuretics (was actually diuretics prior to hospitalization) -07/29/2022: CT A/P: Cirrhotic appearing liver, no HCC.  Evidence of portal hypertension (varices, splenorenal shunt, small volume ascites, mild splenomegaly), multiple cystic lesions in the uncinate process of the pancreas measuring up to 4.1 cm favoring sidebranch IPMN's.  Patient was not found to be a candidate for EUS based on hyponatremia and recommended MRI repeat in 3 months - 08/04/22: GI follow-up with JEllouise Newer  Was still taking salt tablets.  Not taking any diuretics. - 08/09/2022: NA 136, ammonia 57 - 08/15/2022: Patient called to states she is accumulating fluid. - 08/15/2022: Abdominal ultrasound: New small to moderate ascites, GB wall thickening related to ascites/hepatic dysfunction.  Cirrhosis without HCC.  Restart Aldactone 25 mg qod and Lasix 10 mg qod (to take on alternating days).  Placed referral to Nephrology for medication intolerant ascites - 08/23/2022: NA 131  HPI:     Patient is a 71y.o. female presenting to the Gastroenterology Clinic for follow-up.  Was last seen in the GI clinic on 08/04/2022 by JEllouise Newer  Has had issues with hyponatremia, including recent hospitalization, with subsequent careful consideration of diuretic plan.  Has had issues tolerating diuretics in the past, mostly issue with hyponatremia. Did have issue with hyponatremia in 01/2022, with NA 125.  Stopped Lasix and continued on Aldactone monotherapy with slight improvement to 127.  Lasix was then resumed in March at 20  mg every other day along with Aldactone 50 mg every other day.  This resulted in a very nice improvement in  April (NA 134).  However, diuretics were then held earlier this summer or so due to hypotension and subsequent admission with hyponatremia in August.  Had gained 10# while off diuretics.  We restarted aldactone 25 mg qod and lasix 10 mg qod, taken on alternating days. Feels she is still accumulating fluid though, and LE edema on days without Lasix.   Taking Lactulose w/o issue  Had follow-up with her Cardiologist on 08/19/2022.  BP fairly well controlled.  Started on low-dose metoprolol due to HR 105.  Review of systems:     No chest pain, no SOB, no fevers, no urinary sx   Past Medical History:  Diagnosis Date   ALLERGIC RHINITIS 10/09/2007   Allergy    Arthritis    hips, spine   DIABETES MELLITUS, UNCONTROLLED 03/16/2009   FATIGUE 10/21/2008   FIBROMYALGIA 10/09/2007   GERD (gastroesophageal reflux disease)    diet  controlled, no meds   H/O vaginal hysterectomy    HYPERLIPIDEMIA 10/09/2007   diet controlled, no meds   HYPERTENSION 10/09/2007   Morton's neuroma    OBSTRUCTIVE SLEEP APNEA 10/09/2007   Palpitations    Plantar fasciitis    History - right   Sleep apnea    Uses CPAP   TRANSAMINASES, SERUM, ELEVATED 07/08/2009    Patient's surgical history, family medical history, social history, medications and allergies were all reviewed in Epic    Current Outpatient Medications  Medication Sig Dispense Refill   BD PEN NEEDLE NANO 2ND GEN 32G X 4 MM MISC USE AS DIRECTED 400 each 3   clobetasol cream (TEMOVATE) 1.61 % Apply 1 Application topically 2 (two) times daily as needed (yeast infection).     Continuous Blood Gluc Sensor (DEXCOM G7 SENSOR) MISC 1 Device by Does not apply route as directed. Every 10 days 9 each 3   escitalopram (LEXAPRO) 10 MG tablet TAKE 1 TABLET BY MOUTH EVERY DAY 90 tablet 0   furosemide (LASIX) 10 MG/ML solution Take 1 mL (10 mg total) by mouth every other day. Take on alternating days from the Aldactone 15 mL 2   insulin glargine (LANTUS SOLOSTAR)  100 UNIT/ML Solostar Pen Inject 30 Units into the skin at bedtime.     insulin lispro (HUMALOG KWIKPEN) 200 UNIT/ML KwikPen Inject 16-21 Units into the skin 3 (three) times daily with meals. Dose is based on CBG     lactulose, encephalopathy, (CHRONULAC) 10 GM/15ML SOLN Take by mouth 3 (three) times daily. 3-4 tablespoons 3 times a day     metFORMIN (GLUCOPHAGE) 1000 MG tablet TAKE 1 TABLET BY MOUTH TWICE A DAY 180 tablet 3   metoprolol tartrate (LOPRESSOR) 25 MG tablet Take 0.5 tablets (12.5 mg total) by mouth 2 (two) times daily. 90 tablet 3   omeprazole (PRILOSEC) 20 MG capsule Take 20 mg by mouth at bedtime.     sodium chloride 1 g tablet Take 1 tablet (1 g total) by mouth 3 (three) times daily with meals. 30 tablet 0   spironolactone (ALDACTONE) 25 MG tablet Take 1 tablet (25 mg total) by mouth every other day. Take on alternating days from the Lasix. 15 tablet 2   No current facility-administered medications for this visit.    Physical Exam:     BP 120/82   Pulse 87   Ht 5' 3"  (1.6 m)   Wt 178 lb (80.7  kg)   SpO2 98%   BMI 31.53 kg/m   GENERAL:  Pleasant female in NAD PSYCH: : Cooperative, normal affect ABDOMEN: Mildly distended with fluid wave.   NEURO: Alert and oriented x 3, no focal neurologic deficits   IMPRESSION and PLAN:    1) NASH cirrhosis 2) Ascites 3) Esophageal varices 4) Portal hypertensive gastropathy 5) Hyponatremia  Has had issues with diuretic management in the past, mainly hyponatremia.  Diuretics were stopped earlier in the summer this year, but more so due to hypotension.  Was subsequently admitted with hyponatremia, thought 2/2 hypovolemia.  We discussed appropriate fluid volume intake today.  We discussed the possibility of diuretic intolerant ascites, which could require therapeutic paracentesis and potentially TIPS in the future.  Will try to carefully manage diuretics as below:  - Increase Lasix to 10 mg daily - Continue Aldactone 25 mg qod -  Check BMP in 7 days.  If ok, increase Aldactone to 25 mg daily - Referral to Nephrology for assistance in diuretic management given history of electrolyte issues - Was just started on metoprolol last week by her Cardiologist - Repeat EGD in 2025 for variceal surveillance - Low-sodium diet, <2 gm - RTC in 6 weeks or sooner as needed  6) History of colon polyps - Repeat colonoscopy in 2028  I spent 45 minutes of time, including in depth chart review, independent review of results as outlined above, communicating results with the patient directly, face-to-face time with the patient, coordinating care, ordering studies and medications as appropriate, and documentation.       [place] ,DO, FACG 08/24/2022, 11:19 AM

## 2022-08-24 NOTE — Patient Instructions (Addendum)
If you are age 73 or older, your body mass index should be between 23-30. Your Body mass index is 31.53 kg/m. If this is out of the aforementioned range listed, please consider follow up with your Primary Care Provider.  __________________________________________________________  The [facility] providers would like to encourage you to use [facility] to communicate with providers for non-urgent requests or questions.  Due to long hold times on the telephone, sending your provider a message by [facility] may be a faster and more efficient way to get a response.  Please allow 48 business hours for a response.  Please remember that this is for non-urgent requests.    Due to recent changes in healthcare laws, you may see the results of your imaging and laboratory studies on MyChart before your provider has had a chance to review them.  We understand that in some cases there may be results that are confusing or concerning to you. Not all laboratory results come back in the same time frame and the provider may be waiting for multiple results in order to interpret others.  Please give [place] 48 hours in order for your provider to thoroughly review all the results before contacting the office for clarification of your results.   Please do your labs in 7 days.  Increase Lasix to 10 mg daily.  You will be contacted by [place]  to schedule a consult.   [address] [place], [place] [address] [phone]  You have been scheduled for an appointment with Dr.Cirigliano on 10/05/22 at 9:40 am. Please arrive 10 minutes early for your appointment.    Thank you for choosing me and [facility].  Vito Cirigliano, D.O.

## 2022-08-31 ENCOUNTER — Other Ambulatory Visit (INDEPENDENT_AMBULATORY_CARE_PROVIDER_SITE_OTHER): Payer: Medicare PPO

## 2022-08-31 DIAGNOSIS — E871 Hypo-osmolality and hyponatremia: Secondary | ICD-10-CM | POA: Diagnosis not present

## 2022-08-31 DIAGNOSIS — K7460 Unspecified cirrhosis of liver: Secondary | ICD-10-CM | POA: Diagnosis not present

## 2022-08-31 DIAGNOSIS — R188 Other ascites: Secondary | ICD-10-CM | POA: Diagnosis not present

## 2022-08-31 LAB — BASIC METABOLIC PANEL
BUN: 10 mg/dL (ref 6–23)
CO2: 27 mEq/L (ref 19–32)
Calcium: 9 mg/dL (ref 8.4–10.5)
Chloride: 94 mEq/L — ABNORMAL LOW (ref 96–112)
Creatinine, Ser: 0.66 mg/dL (ref 0.40–1.20)
GFR: 87.26 mL/min (ref >60.00)
Glucose, Bld: 130 mg/dL — ABNORMAL HIGH (ref 70–99)
Potassium: 4.1 mEq/L (ref 3.5–5.1)
Sodium: 128 mEq/L — ABNORMAL LOW (ref 135–145)

## 2022-09-01 ENCOUNTER — Telehealth: Payer: Self-pay | Admitting: Internal Medicine

## 2022-09-01 NOTE — Telephone Encounter (Signed)
Called patient regarding upcoming October appointment, patient is notified.

## 2022-09-02 ENCOUNTER — Telehealth: Payer: Self-pay | Admitting: Gastroenterology

## 2022-09-02 ENCOUNTER — Ambulatory Visit: Payer: Medicare PPO | Admitting: Nurse Practitioner

## 2022-09-02 NOTE — Telephone Encounter (Signed)
Pt called concerned that her sodium is 128 on recent bmp.

## 2022-09-02 NOTE — Telephone Encounter (Signed)
Inbound call from patient requesting a call back to go over results. Please advise.

## 2022-09-03 NOTE — Telephone Encounter (Signed)
Labs reviewed.  Sodium 128, otherwise normal potassium and renal function.  This is after increasing Lasix to daily but continuing spironolactone at every other day.  I recommend continuing this current regimen for the time being given previous sensitivity (hyponatremia) with further increases of diuretics.  Does she have an appointment with Nephrology yet?  Has the change in Lasix improved her lower extremity edema?  Can repeat BMP in another 10 days to demonstrate stability

## 2022-09-05 ENCOUNTER — Other Ambulatory Visit: Payer: Self-pay

## 2022-09-05 ENCOUNTER — Other Ambulatory Visit: Payer: Self-pay | Admitting: Family Medicine

## 2022-09-05 DIAGNOSIS — F339 Major depressive disorder, recurrent, unspecified: Secondary | ICD-10-CM

## 2022-09-05 DIAGNOSIS — D509 Iron deficiency anemia, unspecified: Secondary | ICD-10-CM

## 2022-09-05 DIAGNOSIS — K7460 Unspecified cirrhosis of liver: Secondary | ICD-10-CM

## 2022-09-05 DIAGNOSIS — E871 Hypo-osmolality and hyponatremia: Secondary | ICD-10-CM

## 2022-09-05 NOTE — Telephone Encounter (Signed)
Received call from [facility] stating that they did not receive the referral on 08/24/22. Referral faxed to [phone], attention Heather.

## 2022-09-05 NOTE — Telephone Encounter (Signed)
Gave pt recommendations. Pt verbalized understanding and stated she has not received a call from nephrology yet to schedule an appointment. Called the nephrology office and left voicemail on the referral line. Pt reports that lasix did improve her edema.  BMP lab order and reminder placed. Pt asked if an iron panel could be added to lab work because she has an appointment with Dr. Julien Nordmann at the cancer center coming up and wanted to combine lab work.

## 2022-09-06 ENCOUNTER — Ambulatory Visit: Payer: Medicare PPO

## 2022-09-06 ENCOUNTER — Ambulatory Visit: Payer: Medicare PPO | Admitting: Gastroenterology

## 2022-09-06 NOTE — Telephone Encounter (Signed)
Perfectly fine to add the iron panel onto her lab work to make it easy.

## 2022-09-06 NOTE — Telephone Encounter (Signed)
Iron panel order added. Let pt know that referral was refaxed to [facility]. Pt verbalized understanding.

## 2022-09-06 NOTE — Addendum Note (Signed)
Addended by: Marice Potter on: 09/06/2022 08:33 AM   Modules accepted: Orders

## 2022-09-09 ENCOUNTER — Ambulatory Visit (INDEPENDENT_AMBULATORY_CARE_PROVIDER_SITE_OTHER): Payer: Medicare PPO

## 2022-09-09 DIAGNOSIS — Z23 Encounter for immunization: Secondary | ICD-10-CM

## 2022-09-13 ENCOUNTER — Institutional Professional Consult (permissible substitution): Payer: Medicare PPO | Admitting: Pulmonary Disease

## 2022-09-13 DIAGNOSIS — H2513 Age-related nuclear cataract, bilateral: Secondary | ICD-10-CM | POA: Diagnosis not present

## 2022-09-13 DIAGNOSIS — E119 Type 2 diabetes mellitus without complications: Secondary | ICD-10-CM | POA: Diagnosis not present

## 2022-09-13 DIAGNOSIS — H5203 Hypermetropia, bilateral: Secondary | ICD-10-CM | POA: Diagnosis not present

## 2022-09-13 LAB — HM DIABETES EYE EXAM

## 2022-09-15 ENCOUNTER — Telehealth: Payer: Self-pay

## 2022-09-15 ENCOUNTER — Ambulatory Visit (INDEPENDENT_AMBULATORY_CARE_PROVIDER_SITE_OTHER): Payer: Medicare PPO | Admitting: Pulmonary Disease

## 2022-09-15 ENCOUNTER — Other Ambulatory Visit (INDEPENDENT_AMBULATORY_CARE_PROVIDER_SITE_OTHER): Payer: Medicare PPO

## 2022-09-15 ENCOUNTER — Encounter: Payer: Self-pay | Admitting: Pulmonary Disease

## 2022-09-15 ENCOUNTER — Other Ambulatory Visit: Payer: Self-pay

## 2022-09-15 DIAGNOSIS — E871 Hypo-osmolality and hyponatremia: Secondary | ICD-10-CM

## 2022-09-15 DIAGNOSIS — K7460 Unspecified cirrhosis of liver: Secondary | ICD-10-CM | POA: Diagnosis not present

## 2022-09-15 DIAGNOSIS — R0609 Other forms of dyspnea: Secondary | ICD-10-CM | POA: Diagnosis not present

## 2022-09-15 DIAGNOSIS — G4710 Hypersomnia, unspecified: Secondary | ICD-10-CM | POA: Diagnosis not present

## 2022-09-15 DIAGNOSIS — D509 Iron deficiency anemia, unspecified: Secondary | ICD-10-CM

## 2022-09-15 LAB — BASIC METABOLIC PANEL
BUN: 11 mg/dL (ref 6–23)
CO2: 26 mEq/L (ref 19–32)
Calcium: 9.2 mg/dL (ref 8.4–10.5)
Chloride: 94 mEq/L — ABNORMAL LOW (ref 96–112)
Creatinine, Ser: 0.6 mg/dL (ref 0.40–1.20)
GFR: 89.26 mL/min (ref >60.00)
Glucose, Bld: 148 mg/dL — ABNORMAL HIGH (ref 70–99)
Potassium: 4.4 mEq/L (ref 3.5–5.1)
Sodium: 128 mEq/L — ABNORMAL LOW (ref 135–145)

## 2022-09-15 LAB — IBC + FERRITIN
Ferritin: 23.3 ng/mL (ref 10.0–291.0)
Iron: 61 ug/dL (ref 42–145)
Saturation Ratios: 13.7 % — ABNORMAL LOW (ref 20.0–50.0)
TIBC: 443.8 ug/dL (ref 250.0–450.0)
Transferrin: 317 mg/dL (ref 212.0–360.0)

## 2022-09-15 MED ORDER — FUROSEMIDE 20 MG PO TABS: 20.0000 mg | ORAL_TABLET | Freq: Every day | ORAL | 1 refills | Status: DC

## 2022-09-15 MED ORDER — SPIRONOLACTONE 50 MG PO TABS: 50.0000 mg | ORAL_TABLET | Freq: Every day | ORAL | 1 refills | Status: DC

## 2022-09-15 NOTE — Telephone Encounter (Signed)
Do you want lasix 20 mg every other day or would the lasix be daily as well. Also pt reported a 13 pound weight gain but according to chart patient weighed 177 lbs on September 15th and 183 lbs today. Thank you!

## 2022-09-15 NOTE — Telephone Encounter (Signed)
Called pt to let her know she is due for a BMP and iron panel. Pt stated she stopped by the lab today and labs have already been collected. Pt stated she is concerned about how much swelling currently has. Pt stated she was 183 lbs at her Pulmonologist's office today which is a 13 pound weight gain for her. Pt also reports that her stomach is swollen and her pants are hard to zip. Pt's ankles are swollen all day even when she first wakes up in the morning. Currently pt is taking lasix 10 mg every other day and Aldactone 25 mg on alternating days from lasix.  Pt also requested that lab results from bmp and iron panel be sent to her pulmonologist, Dr. Elsworth Soho. Let pt know that provider can see lab results in epic but I will also route them to him.  Dr. Hilarie Fredrickson as DOD please advise.

## 2022-09-15 NOTE — Assessment & Plan Note (Signed)
There is no evidence of pulmonary fibrosis on CT or pulm hypertension on echo. She is a never smoker.  I feel that her dyspnea is more likely related to fluid retention related to cirrhosis. Unfortunately diuretic treatment seems to have been complicated by hyponatremia and hypotension

## 2022-09-15 NOTE — Patient Instructions (Signed)
You have mild sleep apnea Sleepiness is more likely related to liver issues & shortness of breath due to fluid

## 2022-09-15 NOTE — Telephone Encounter (Signed)
Gave pt recommendations and prescriptions sent to pharmacy. Lab order and reminder placed.

## 2022-09-15 NOTE — Progress Notes (Signed)
Subjective:    Patient ID: Dana Dyer, female    DOB: Jan 30, 1951, 71 y.o.   MRN: [id number]  HPI  Chief Complaint  Patient presents with   Consult    Pt states she has some SOB with exertion x 5 months. Not using a CPAP at this time   71 year old never smoker presents to reestablish care for sleepiness and increased dyspnea on exertion I have seen her in the past, last 2019 for mild sleep apnea.  She used CPAP machine for a while but did not perceive any benefit and finally stopped using it  In the interim she has been diagnosed with cirrhosis of the liver, nonalcoholic.  She developed hyponatremia related to diuretics, required hospital admission and diuretics.  She gained 10 pounds, she is now on Lasix daily and Aldactone every other day. She reports increased abdominal distention and pedal edema She is taking lactulose for high ammonia levels, reports increased sleepiness in the daytime, mild tremors of her hands.  She feels like her handwriting is changed   She was referred by cardiology, cardiology work-up included Coronary CTA shows minimal CAD Echo show mild valvular abnormalities.  Normal LV systolic function. unable to assess her diastolic dysfunction   PMH - Liver cirrhosis , NASH  I have reviewed cardiology and gastroenterology consultations  Significant tests/ events reviewed  CT cors 07/2022 >> clear lungs  PSG 04/2005 showed mild OSA with AHI 12/hr, severe in REM with AHI 32/h --> corrected with CPAP +10 cm , pillows with chin strap ( confirmed by download on auto )   HST 06/2015 >> mild OSA 13/hr - new CPAP Rx   Past Medical History:  Diagnosis Date   ALLERGIC RHINITIS 10/09/2007   Allergy    Arthritis    hips, spine   DIABETES MELLITUS, UNCONTROLLED 03/16/2009   FATIGUE 10/21/2008   FIBROMYALGIA 10/09/2007   GERD (gastroesophageal reflux disease)    diet  controlled, no meds   H/O vaginal hysterectomy    HYPERLIPIDEMIA 10/09/2007   diet controlled,  no meds   HYPERTENSION 10/09/2007   Morton's neuroma    OBSTRUCTIVE SLEEP APNEA 10/09/2007   Palpitations    Plantar fasciitis    History - right   Sleep apnea    Uses CPAP   TRANSAMINASES, SERUM, ELEVATED 07/08/2009   Past Surgical History:  Procedure Laterality Date   ABDOMINAL HYSTERECTOMY  1979   prolapsed uterus   BASAL CELL CARCINOMA EXCISION     BREAST BIOPSY Left 2012   left 2012 stereo   BREAST REDUCTION SURGERY Bilateral 1992   COLONOSCOPY  2006   [facility]   mamoplasty     reduction   REDUCTION MAMMAPLASTY Bilateral 1992   TONSILLECTOMY  1959   TUBAL LIGATION  1976    Allergies  Allergen Reactions   Amoxicillin-Pot Clavulanate     Severe diarrhea   Codeine Sulfate     REACTION: "hyper"   Diflucan [Fluconazole] Hives   Epinephrine Other (See Comments)    Racing heart rate   Erythromycin Base Diarrhea and Other (See Comments)    cramps   Invokana [Canagliflozin] Other (See Comments)    Recurrent yeast infections   Irbesartan-Hydrochlorothiazide Other (See Comments)    Unknown reaction   Victoza [Liraglutide] Nausea Only   Latex Rash    Long term use   Ozempic (0.25 Or 0.5 Mg-Dose) [Semaglutide(0.25 Or 0.35m-Dos)] Nausea Only    Social History   Socioeconomic History   Marital status: Married  Spouse name: Not on file   Number of children: Not on file   Years of education: Not on file   Highest education level: Not on file  Occupational History   Not on file  Tobacco Use   Smoking status: Never   Smokeless tobacco: Never  Vaping Use   Vaping Use: Never used  Substance and Sexual Activity   Alcohol use: No    Alcohol/week: 0.0 standard drinks of alcohol   Drug use: No   Sexual activity: Yes    Birth control/protection: Other-see comments, Post-menopausal    Comment: Hysterectomy  Other Topics Concern   Not on file  Social History Narrative   Not on file   Social Determinants of Health   Financial Resource Strain: Low Risk   (08/10/2022)   Overall Financial Resource Strain (CARDIA)    Difficulty of Paying Living Expenses: Not hard at all  Food Insecurity: No Food Insecurity (08/10/2022)   Hunger Vital Sign    Worried About Running Out of Food in the Last Year: Never true    [place] in the Last Year: Never true  Transportation Needs: No Transportation Needs (08/10/2022)   PRAPARE - Hydrologist (Medical): No    Lack of Transportation (Non-Medical): No  Physical Activity: Inactive (08/10/2022)   Exercise Vital Sign    Days of Exercise per Week: 0 days    Minutes of Exercise per Session: 0 min  Stress: No Stress Concern Present (08/10/2022)   [place]    Feeling of Stress : Not at all  Social Connections: [place] (08/10/2022)   Social Connection and Isolation Panel [NHANES]    Frequency of Communication with Friends and Family: More than three times a week    Frequency of Social Gatherings with Friends and Family: More than three times a week    Attends Religious Services: More than 4 times per year    Active Member of [employer] or Organizations: Yes    Attends Music therapist: More than 4 times per year    Marital Status: Married  Human resources officer Violence: Not At Risk (08/10/2022)   Humiliation, Afraid, Rape, and Kick questionnaire    Fear of Current or Ex-Partner: No    Emotionally Abused: No    Physically Abused: No    Sexually Abused: No    Family History  Problem Relation Age of Onset   Diabetes Mother    Arthritis Mother    Cancer Mother        breast   Breast cancer Mother 37   Alcohol abuse Father    Arthritis Father    Hypertension Maternal Grandfather    Miscarriages / Stillbirths Paternal Grandfather    Breast cancer Maternal Aunt        diagnosed in her 70's   Colon polyps Neg Hx    Colon cancer Neg Hx    Esophageal cancer Neg Hx    Rectal cancer Neg Hx    Stomach  cancer Neg Hx      Review of Systems  Shortness of breath with activity Nonproductive cough Acid heartburn Weight gain due to fluid retention Loss of appetite Tooth problems Feet swelling    Objective:   Physical Exam  Gen. Pleasant, obese, in no distress, normal affect ENT - no pallor,icterus, no post nasal drip, class 2 airway Neck: No JVD, no thyromegaly, no carotid bruits Lungs: no use of accessory  muscles, no dullness to percussion, decreased without rales or rhonchi  Cardiovascular: Rhythm regular, heart sounds  normal, no murmurs or gallops, no peripheral edema Abdomen: soft and non-tender, no hepatosplenomegaly, BS normal. Musculoskeletal: No deformities, no cyanosis or clubbing Neuro:  alert, non focal, mild tremors       Assessment & Plan:

## 2022-09-15 NOTE — Telephone Encounter (Signed)
Lasix 20 mg and aldactone 50 mg daily

## 2022-09-15 NOTE — Assessment & Plan Note (Signed)
She has mild OSA in the past.  She does not want repeat testing at this time. I feel her hypersomnolence is more likely related to early hepatic encephalopathy -she does report change in her handwriting and mild tremors which would be consistent. She is on lactulose already and has close follow-up with GI

## 2022-09-15 NOTE — Telephone Encounter (Signed)
With that much weight gain, known cirrhosis with ascites I would recommend we increase diuretics Please stressed the importance of low-sodium diet; 2 g sodium diet restriction daily  Her sodium is low likely related to her cirrhosis and ascites We will need to check a repeat BMP next Tuesday to ensure sodium does not drop further  For now I would increase Lasix to 20 mg and spironolactone to 50 mg daily; further titration if tolerating from a metabolic standpoint  Iron studies and BMP reviewed; they are stable.  Please route follow-up lab to Dr. Bryan Lemma next week after diuretic titration

## 2022-09-15 NOTE — Telephone Encounter (Signed)
-----   Message from Marice Potter, RN sent at 09/05/2022  9:08 AM EDT ----- Regarding: labs Pt needs bmp and iron panel. Order in epic.

## 2022-09-16 ENCOUNTER — Other Ambulatory Visit: Payer: Self-pay | Admitting: Gastroenterology

## 2022-09-20 ENCOUNTER — Other Ambulatory Visit (INDEPENDENT_AMBULATORY_CARE_PROVIDER_SITE_OTHER): Payer: Medicare PPO

## 2022-09-20 DIAGNOSIS — K7460 Unspecified cirrhosis of liver: Secondary | ICD-10-CM | POA: Diagnosis not present

## 2022-09-20 DIAGNOSIS — E871 Hypo-osmolality and hyponatremia: Secondary | ICD-10-CM

## 2022-09-20 LAB — BASIC METABOLIC PANEL
BUN: 13 mg/dL (ref 6–23)
CO2: 29 mEq/L (ref 19–32)
Calcium: 9.5 mg/dL (ref 8.4–10.5)
Chloride: 94 mEq/L — ABNORMAL LOW (ref 96–112)
Creatinine, Ser: 0.6 mg/dL (ref 0.40–1.20)
GFR: 89.26 mL/min (ref >60.00)
Glucose, Bld: 130 mg/dL — ABNORMAL HIGH (ref 70–99)
Potassium: 4.4 mEq/L (ref 3.5–5.1)
Sodium: 129 mEq/L — ABNORMAL LOW (ref 135–145)

## 2022-09-20 NOTE — Telephone Encounter (Signed)
Called pt and pt reported she is doing well with new dosing of diuretics and has lost about a pound. Pt already came to lab for repeat BMP.

## 2022-09-21 DIAGNOSIS — D692 Other nonthrombocytopenic purpura: Secondary | ICD-10-CM | POA: Diagnosis not present

## 2022-09-21 DIAGNOSIS — L821 Other seborrheic keratosis: Secondary | ICD-10-CM | POA: Diagnosis not present

## 2022-09-21 DIAGNOSIS — Z85828 Personal history of other malignant neoplasm of skin: Secondary | ICD-10-CM | POA: Diagnosis not present

## 2022-09-21 DIAGNOSIS — L570 Actinic keratosis: Secondary | ICD-10-CM | POA: Diagnosis not present

## 2022-09-22 ENCOUNTER — Other Ambulatory Visit: Payer: Self-pay | Admitting: Gastroenterology

## 2022-09-22 NOTE — Telephone Encounter (Signed)
Called [facility] and left voicemail for referral coordinator.

## 2022-09-23 ENCOUNTER — Ambulatory Visit: Payer: Medicare PPO | Admitting: Gastroenterology

## 2022-09-25 ENCOUNTER — Encounter: Payer: Self-pay | Admitting: Gastroenterology

## 2022-09-28 ENCOUNTER — Inpatient Hospital Stay: Payer: Medicare PPO | Attending: Internal Medicine

## 2022-09-28 ENCOUNTER — Inpatient Hospital Stay: Payer: Medicare PPO | Admitting: Internal Medicine

## 2022-09-28 ENCOUNTER — Other Ambulatory Visit: Payer: Self-pay

## 2022-09-28 VITALS — BP 119/62 | HR 75 | Temp 98.4°F | Resp 17 | Ht 63.0 in | Wt 171.3 lb

## 2022-09-28 DIAGNOSIS — D500 Iron deficiency anemia secondary to blood loss (chronic): Secondary | ICD-10-CM | POA: Diagnosis not present

## 2022-09-28 DIAGNOSIS — D693 Immune thrombocytopenic purpura: Secondary | ICD-10-CM | POA: Diagnosis not present

## 2022-09-28 DIAGNOSIS — E119 Type 2 diabetes mellitus without complications: Secondary | ICD-10-CM | POA: Diagnosis not present

## 2022-09-28 DIAGNOSIS — Z85828 Personal history of other malignant neoplasm of skin: Secondary | ICD-10-CM | POA: Diagnosis not present

## 2022-09-28 DIAGNOSIS — Z90710 Acquired absence of both cervix and uterus: Secondary | ICD-10-CM | POA: Insufficient documentation

## 2022-09-28 DIAGNOSIS — I10 Essential (primary) hypertension: Secondary | ICD-10-CM | POA: Insufficient documentation

## 2022-09-28 LAB — CBC WITH DIFFERENTIAL (CANCER CENTER ONLY)
Abs Immature Granulocytes: 0.02 10*3/uL (ref 0.00–0.07)
Basophils Absolute: 0 10*3/uL (ref 0.0–0.1)
Basophils Relative: 1 %
Eosinophils Absolute: 0.1 10*3/uL (ref 0.0–0.5)
Eosinophils Relative: 2 %
HCT: 30.1 % — ABNORMAL LOW (ref 36.0–46.0)
Hemoglobin: 10.6 g/dL — ABNORMAL LOW (ref 12.0–15.0)
Immature Granulocytes: 0 %
Lymphocytes Relative: 18 %
Lymphs Abs: 1.1 10*3/uL (ref 0.7–4.0)
MCH: 30.3 pg (ref 26.0–34.0)
MCHC: 35.2 g/dL (ref 30.0–36.0)
MCV: 86 fL (ref 80.0–100.0)
Monocytes Absolute: 0.9 10*3/uL (ref 0.1–1.0)
Monocytes Relative: 15 %
Neutro Abs: 3.7 10*3/uL (ref 1.7–7.7)
Neutrophils Relative %: 64 %
Platelet Count: 114 10*3/uL — ABNORMAL LOW (ref 150–400)
RBC: 3.5 MIL/uL — ABNORMAL LOW (ref 3.87–5.11)
RDW: 16.5 % — ABNORMAL HIGH (ref 11.5–15.5)
WBC Count: 5.8 10*3/uL (ref 4.0–10.5)
nRBC: 0 % (ref 0.0–0.2)

## 2022-09-28 NOTE — Progress Notes (Signed)
[facility] [phone]   [phone]  OFFICE PROGRESS NOTE  Eulas Post, MD [address] [place] [address]  DIAGNOSIS:  1) history of iron deficiency anemia likely secondary to gastrointestinal blood loss. The patient has intolerance to the oral iron tablets. 2) history of ITP.  PRIOR THERAPY: Venofer 300 Mg IV weekly for 3 weeks on as-needed basis last treatment was given 07/01/2022.  CURRENT THERAPY: None  INTERVAL HISTORY: Dana Dyer 71 y.o. female returns to the clinic today for follow-up visit.  The patient is feeling fine today with no concerning complaints except for mild fatigue.  She had a fall few weeks ago with a lot of bleeding and ecchymosis in her face.  She is feeling much better now.  She denied having any current chest pain but has shortness of breath with exertion with no cough or hemoptysis.  She has no dizzy spells.  She has no nausea, vomiting, diarrhea or constipation.  She has no headache or visual changes.  She is here today for evaluation with repeat CBC, iron study and ferritin.  MEDICAL HISTORY: Past Medical History:  Diagnosis Date   ALLERGIC RHINITIS 10/09/2007   Allergy    Arthritis    hips, spine   DIABETES MELLITUS, UNCONTROLLED 03/16/2009   FATIGUE 10/21/2008   FIBROMYALGIA 10/09/2007   GERD (gastroesophageal reflux disease)    diet  controlled, no meds   H/O vaginal hysterectomy    HYPERLIPIDEMIA 10/09/2007   diet controlled, no meds   HYPERTENSION 10/09/2007   Morton's neuroma    OBSTRUCTIVE SLEEP APNEA 10/09/2007   Palpitations    Plantar fasciitis    History - right   Sleep apnea    Uses CPAP   TRANSAMINASES, SERUM, ELEVATED 07/08/2009    ALLERGIES:  is allergic to amoxicillin-pot clavulanate, codeine sulfate, diflucan [fluconazole], epinephrine, erythromycin base, invokana [canagliflozin], irbesartan-hydrochlorothiazide, victoza [liraglutide], latex, and ozempic (0.25  or 0.5 mg-dose) [semaglutide(0.25 or 0.44m-dos)].  MEDICATIONS:  Current Outpatient Medications  Medication Sig Dispense Refill   BD PEN NEEDLE NANO 2ND GEN 32G X 4 MM MISC USE AS DIRECTED 400 each 3   clobetasol cream (TEMOVATE) 09.32% Apply 1 Application topically 2 (two) times daily as needed (yeast infection).     Continuous Blood Gluc Sensor (DEXCOM G7 SENSOR) MISC 1 Device by Does not apply route as directed. Every 10 days 9 each 3   escitalopram (LEXAPRO) 10 MG tablet TAKE 1 TABLET BY MOUTH EVERY DAY 90 tablet 0   furosemide (LASIX) 10 MG/ML solution Take 2 mLs (20 mg total) by mouth daily. 60 mL 1   furosemide (LASIX) 20 MG tablet Take 1 tablet (20 mg total) by mouth daily. 30 tablet 1   insulin glargine (LANTUS SOLOSTAR) 100 UNIT/ML Solostar Pen Inject 30 Units into the skin at bedtime.     insulin lispro (HUMALOG KWIKPEN) 200 UNIT/ML KwikPen Inject 16-21 Units into the skin 3 (three) times daily with meals. Dose is based on CBG     lactulose, encephalopathy, (CHRONULAC) 10 GM/15ML SOLN Take by mouth 3 (three) times daily. 3-4 tablespoons 3 times a day     metFORMIN (GLUCOPHAGE) 1000 MG tablet TAKE 1 TABLET BY MOUTH TWICE A DAY 180 tablet 3   metoprolol tartrate (LOPRESSOR) 25 MG tablet Take 0.5 tablets (12.5 mg total) by mouth 2 (two) times daily. 90 tablet 3   omeprazole (PRILOSEC) 20 MG capsule Take 20 mg by mouth at bedtime.  sodium chloride 1 g tablet Take 1 tablet (1 g total) by mouth 3 (three) times daily with meals. 30 tablet 0   spironolactone (ALDACTONE) 50 MG tablet Take 1 tablet (50 mg total) by mouth daily. 30 tablet 1   No current facility-administered medications for this visit.    SURGICAL HISTORY:  Past Surgical History:  Procedure Laterality Date   ABDOMINAL HYSTERECTOMY  1979   prolapsed uterus   BASAL CELL CARCINOMA EXCISION     BREAST BIOPSY Left 2012   left 2012 stereo   BREAST REDUCTION SURGERY Bilateral 1992   COLONOSCOPY  2006   [facility]    mamoplasty     reduction   REDUCTION MAMMAPLASTY Bilateral 1992   TONSILLECTOMY  1959   TUBAL LIGATION  1976    REVIEW OF SYSTEMS:  A comprehensive review of systems was negative except for: Constitutional: positive for fatigue Neurological: positive for dizziness   PHYSICAL EXAMINATION: General appearance: alert, cooperative, fatigued, and no distress Head: Normocephalic, without obvious abnormality, atraumatic Neck: no adenopathy, no JVD, supple, symmetrical, trachea midline, and thyroid not enlarged, symmetric, no tenderness/mass/nodules Lymph nodes: Cervical, supraclavicular, and axillary nodes normal. Resp: clear to auscultation bilaterally Back: symmetric, no curvature. ROM normal. No CVA tenderness. Cardio: regular rate and rhythm, S1, S2 normal, no murmur, click, rub or gallop GI: soft, non-tender; bowel sounds normal; no masses,  no organomegaly Extremities: extremities normal, atraumatic, no cyanosis or edema  ECOG PERFORMANCE STATUS: 1 - Symptomatic but completely ambulatory  Blood pressure 119/62, pulse 75, temperature 98.4 F (36.9 C), temperature source Oral, resp. rate 17, height 5' 3"  (1.6 m), weight 171 lb 5 oz (77.7 kg), SpO2 99 %.  LABORATORY DATA: Lab Results  Component Value Date   WBC 5.8 09/28/2022   HGB 10.6 (L) 09/28/2022   HCT 30.1 (L) 09/28/2022   MCV 86.0 09/28/2022   PLT 114 (L) 09/28/2022      Chemistry      Component Value Date/Time   NA 129 (L) 09/20/2022 1157   K 4.4 09/20/2022 1157   CL 94 (L) 09/20/2022 1157   CO2 29 09/20/2022 1157   BUN 13 09/20/2022 1157   CREATININE 0.60 09/20/2022 1157   CREATININE 0.87 06/28/2022 1129      Component Value Date/Time   CALCIUM 9.5 09/20/2022 1157   ALKPHOS 94 08/09/2022 0924   AST 45 (H) 08/09/2022 0924   AST 41 06/28/2022 1129   ALT 55 (H) 08/09/2022 0924   ALT 44 06/28/2022 1129   BILITOT 1.9 (H) 08/09/2022 0924   BILITOT 1.7 (H) 06/28/2022 1129       RADIOGRAPHIC STUDIES: No results  found.  ASSESSMENT AND PLAN: This is a very pleasant 71 years old white female with history of iron deficiency anemia secondary to gastrointestinal blood loss as well as history of ITP.  The patient received treatment with Venofer 300 Mg IV weekly for 3 weeks 3 months ago and tolerated it much better and she felt better after the iron infusion.   Unfortunately few weeks ago she has a fall with a lot of ecchymosis and blood loss. She had repeat CBC today that showed hemoglobin of 10.6 and hematocrit 30.1%.  Platelets count are 114,000.  Her most recent iron study on 09/15/2022 showed normal serum iron of 61 but she continues to have low iron saturation of 13.7.  Ferritin level was also low normal at 23.3. I recommended for the patient to proceed with additional iron infusion with Venofer 300 Mg  IV weekly for 3 weeks. I will see her back for follow-up visit in 3 months for evaluation and repeat CBC, iron study and ferritin. She was advised to call immediately if she has any other concerning symptoms in the interval. The patient voices understanding of current disease status and treatment options and is in agreement with the current care plan.  All questions were answered. The patient knows to call the clinic with any problems, questions or concerns. We can certainly see the patient much sooner if necessary.  The total time spent in the appointment was 20 minutes.  Disclaimer: This note was dictated with voice recognition software. Similar sounding words can inadvertently be transcribed and may not be corrected upon review.

## 2022-09-29 ENCOUNTER — Telehealth: Payer: Self-pay | Admitting: Pharmacy Technician

## 2022-09-29 ENCOUNTER — Other Ambulatory Visit: Payer: Self-pay | Admitting: Pharmacy Technician

## 2022-09-29 NOTE — Telephone Encounter (Signed)
Auth Submission: NO AUTH NEEDED Payer: [employer] Medication & CPT/J Code(s) submitted: Venofer (Iron Sucrose) J1756 Route of submission (phone, fax, portal):  Phone # Fax # Auth type: Buy/Bill Units/visits requested: X3 DOSES Reference number:  Approval from: 09/29/22 to 12/04/22

## 2022-10-02 ENCOUNTER — Encounter: Payer: Self-pay | Admitting: Cardiovascular Disease

## 2022-10-02 NOTE — Progress Notes (Unsigned)
Cardiology Office Note:    Date:  10/03/2022   ID:  OURANIA Dyer, DOB 1951-12-04, MRN [id number]  PCP:  Eulas Post, MD   [facility] Providers Cardiologist:    }    Referring MD: Eulas Post, MD   Chief Complaint  Patient presents with   Hypertension         History of Present Illness:    Dana Dyer is a 71 y.o. female with a hx of HTN, , DM Has been told she is dehydrated by her rheumatologist  Now is here with hypotension episodes   Has NASH Is on Aldactone 50 for her cirrhosis  Is also on Losartan 100 ( for her DM)  Amlodipine 5 mg a day  No CP or dyspnea  with exertion  Gets tired in her arms when she walks up hill  No chest tightness, no intrascapular pain  Does have presyncope with exertion  No real symptoms at rest.   Has had DM for ~ 20 years  Some of her symptoms are suggestive of CAD ( Exertional arm fatigue, dyspnea   Aug. 14, 2023  Seen with daughter, Dana Dyer is seen for follow up of her HTN and orthostatic hypotension Coronary CT angiogram shows a coronary calcium score of 0.  He has a moderate stenosis in the mid LAD and a moderate to severe stenosis and a very small first diagonal branch.  She was incidentally noted to have some thickening in his esophagus. Is limited to 24 oz of water intake a day   Sept. 15, 2023: [name] is seen for follow up of her DOE  Coronary CT angiogram from July 14, 2022 reveals mild to moderate mid LAD disease.  Coronary calcium score 0.  Breathing is ok on flat land,  gets DOE with walking up a hill    She has a fine tremor   LVEF is normal by echo in July 2023.  She has mild aortic stenosis.  Trivial mitral regurgitation  Has had hyponatremia ( had been started on lasix which likely was contributing to her hyponatremia )    Oct. 30, 2023 Dana Dyer is seen for follow up of her DOE, HTN  We started her on metoprolol at her last visit .  She has been found to have iron  deficient anemia   We started her on metoprolol Her HR is slower and she is feeling better   Still has some DOE but not as bad.    I suspect some / most of her DOE is due to generalized deconditioning    Past Medical History:  Diagnosis Date   ALLERGIC RHINITIS 10/09/2007   Allergy    Arthritis    hips, spine   DIABETES MELLITUS, UNCONTROLLED 03/16/2009   FATIGUE 10/21/2008   FIBROMYALGIA 10/09/2007   GERD (gastroesophageal reflux disease)    diet  controlled, no meds   H/O vaginal hysterectomy    HYPERLIPIDEMIA 10/09/2007   diet controlled, no meds   HYPERTENSION 10/09/2007   Morton's neuroma    OBSTRUCTIVE SLEEP APNEA 10/09/2007   Palpitations    Plantar fasciitis    History - right   Sleep apnea    Uses CPAP   TRANSAMINASES, SERUM, ELEVATED 07/08/2009    Past Surgical History:  Procedure Laterality Date   ABDOMINAL HYSTERECTOMY  1979   prolapsed uterus   BASAL CELL CARCINOMA EXCISION     BREAST BIOPSY Left 2012   left 2012 stereo   BREAST  REDUCTION SURGERY Bilateral 1992   COLONOSCOPY  2006   [place]   mamoplasty     reduction   REDUCTION MAMMAPLASTY Bilateral 1992   TONSILLECTOMY  1959   TUBAL LIGATION  1976    Current Medications: Current Meds  Medication Sig   BD PEN NEEDLE NANO 2ND GEN 32G X 4 MM MISC USE AS DIRECTED   clobetasol cream (TEMOVATE) 8.10 % Apply 1 Application topically 2 (two) times daily as needed (yeast infection).   Continuous Blood Gluc Sensor (DEXCOM G7 SENSOR) MISC 1 Device by Does not apply route as directed. Every 10 days   escitalopram (LEXAPRO) 10 MG tablet TAKE 1 TABLET BY MOUTH EVERY DAY   furosemide (LASIX) 10 MG/ML solution Take 2 mLs (20 mg total) by mouth daily.   furosemide (LASIX) 20 MG tablet Take 1 tablet (20 mg total) by mouth daily.   insulin glargine (LANTUS SOLOSTAR) 100 UNIT/ML Solostar Pen Inject 30 Units into the skin at bedtime.   insulin lispro (HUMALOG KWIKPEN) 200 UNIT/ML KwikPen Inject 16-21 Units into  the skin 3 (three) times daily with meals. Dose is based on CBG   lactulose, encephalopathy, (CHRONULAC) 10 GM/15ML SOLN Take by mouth 3 (three) times daily. 3-4 tablespoons 3 times a day   metFORMIN (GLUCOPHAGE) 1000 MG tablet TAKE 1 TABLET BY MOUTH TWICE A DAY   metoprolol tartrate (LOPRESSOR) 25 MG tablet Take 0.5 tablets (12.5 mg total) by mouth 2 (two) times daily.   omeprazole (PRILOSEC) 20 MG capsule Take 20 mg by mouth at bedtime.   sodium chloride 1 g tablet Take 1 tablet (1 g total) by mouth 3 (three) times daily with meals.   spironolactone (ALDACTONE) 50 MG tablet Take 1 tablet (50 mg total) by mouth daily.     Allergies:   Amoxicillin-pot clavulanate, Codeine sulfate, Diflucan [fluconazole], Epinephrine, Erythromycin base, Invokana [canagliflozin], Irbesartan-hydrochlorothiazide, Victoza [liraglutide], Latex, and Ozempic (0.25 or 0.5 mg-dose) [semaglutide(0.25 or 0.48m-dos)]   Social History   Socioeconomic History   Marital status: Married    Spouse name: Not on file   Number of children: Not on file   Years of education: Not on file   Highest education level: Not on file  Occupational History   Not on file  Tobacco Use   Smoking status: Never   Smokeless tobacco: Never  Vaping Use   Vaping Use: Never used  Substance and Sexual Activity   Alcohol use: No    Alcohol/week: 0.0 standard drinks of alcohol   Drug use: No   Sexual activity: Yes    Birth control/protection: Other-see comments, Post-menopausal    Comment: Hysterectomy  Other Topics Concern   Not on file  Social History Narrative   Not on file   Social Determinants of Health   Financial Resource Strain: Low Risk  (08/10/2022)   Overall Financial Resource Strain (CARDIA)    Difficulty of Paying Living Expenses: Not hard at all  Food Insecurity: No Food Insecurity (08/10/2022)   Hunger Vital Sign    Worried About Running Out of Food in the Last Year: Never true    Ran Out of Food in the Last Year: Never  true  Transportation Needs: No Transportation Needs (08/10/2022)   PRAPARE - THydrologist(Medical): No    Lack of Transportation (Non-Medical): No  Physical Activity: Inactive (08/10/2022)   Exercise Vital Sign    Days of Exercise per Week: 0 days    Minutes of Exercise per Session: 0  min  Stress: No Stress Concern Present (08/10/2022)   [place]    Feeling of Stress : Not at all  Social Connections: [place] (08/10/2022)   Social Connection and Isolation Panel [NHANES]    Frequency of Communication with Friends and Family: More than three times a week    Frequency of Social Gatherings with Friends and Family: More than three times a week    Attends Religious Services: More than 4 times per year    Active Member of [employer] or Organizations: Yes    Attends Music therapist: More than 4 times per year    Marital Status: Married     Family History: The patient's family history includes Alcohol abuse in her father; Arthritis in her father and mother; Breast cancer in her maternal aunt; Breast cancer (age of onset: 15) in her mother; Cancer in her mother; Diabetes in her mother; Hypertension in her maternal grandfather; Miscarriages / [place] in her paternal grandfather. There is no history of Colon polyps, Colon cancer, Esophageal cancer, Rectal cancer, or Stomach cancer.  ROS:   Please see the history of present illness.     All other systems reviewed and are negative.  EKGs/Labs/Other Studies Reviewed:    The following studies were reviewed today:   EKG:     Recent Labs: 06/28/2022: TSH 2.736 08/01/2022: Magnesium 1.8 08/09/2022: ALT 55 09/20/2022: BUN 13; Creatinine, Ser 0.60; Potassium 4.4; Sodium 129 09/28/2022: Hemoglobin 10.6; Platelet Count 114  Recent Lipid Panel    Component Value Date/Time   CHOL 154 09/30/2021 1048   TRIG 128.0 09/30/2021 1048   HDL  59.30 09/30/2021 1048   CHOLHDL 3 09/30/2021 1048   VLDL 25.6 09/30/2021 1048   LDLCALC 69 09/30/2021 1048   LDLDIRECT 124.9 10/14/2010 0816     Risk Assessment/Calculations:           Physical Exam:     Physical Exam: Blood pressure 124/62, pulse 82, height 5' 3"  (1.6 m), weight 168 lb (76.2 kg), SpO2 95 %.       GEN:  elderly female,  in no acute distress HEENT: Normal NECK: No JVD; No carotid bruits LYMPHATICS: No lymphadenopathy CARDIAC: RRR , 1-2 / 6 systolic murmur  RESPIRATORY:  Clear to auscultation without rales, wheezing or rhonchi  ABDOMEN: Soft, non-tender, non-distended MUSCULOSKELETAL:  No edema; No deformity  SKIN: Warm and dry NEUROLOGIC:  Alert and oriented x 3     ASSESSMENT:    1. DOE (dyspnea on exertion)       PLAN:       Exercise intolerance:    I suspect this is primarily due to deconditioning .  Encouraged her to work out.   Contin current meds.   3.  Mild- mod  aortic stenosis:   stable           Medication Adjustments/Labs and Tests Ordered: Current medicines are reviewed at length with the patient today.  Concerns regarding medicines are outlined above.  No orders of the defined types were placed in this encounter.  No orders of the defined types were placed in this encounter.   Patient Instructions  Medication Instructions:  Your physician recommends that you continue on your current medications as directed. Please refer to the Current Medication list given to you today.  *If you need a refill on your cardiac medications before your next appointment, please call your pharmacy*   Lab Work: NONE If you have labs (  blood work) drawn today and your tests are completely normal, you will receive your results only by: [place] (if you have MyChart) OR A paper copy in the mail If you have any lab test that is abnormal or we need to change your treatment, we will call you to review the  results.   Testing/Procedures: NONE   Follow-Up: At [facility], you and your health needs are our priority.  As part of our continuing mission to provide you with exceptional heart care, we have created designated Provider Care Teams.  These Care Teams include your primary Cardiologist (physician) and Advanced Practice Providers (APPs -  Physician Assistants and Nurse Practitioners) who all work together to provide you with the care you need, when you need it.  Your next appointment:   1 year(s)  The format for your next appointment:   In Person  Provider:   Mertie Moores, MD    Important Information About Sugar         Signed, Mertie Moores, MD  10/03/2022 3:44 PM    [place]

## 2022-10-03 ENCOUNTER — Ambulatory Visit: Payer: Medicare PPO | Attending: Cardiovascular Disease | Admitting: Cardiovascular Disease

## 2022-10-03 VITALS — BP 124/62 | HR 82 | Ht 63.0 in | Wt 168.0 lb

## 2022-10-03 DIAGNOSIS — R0609 Other forms of dyspnea: Secondary | ICD-10-CM

## 2022-10-03 NOTE — Patient Instructions (Signed)
Medication Instructions:  Your physician recommends that you continue on your current medications as directed. Please refer to the Current Medication list given to you today.  *If you need a refill on your cardiac medications before your next appointment, please call your pharmacy*   Lab Work: NONE If you have labs (blood work) drawn today and your tests are completely normal, you will receive your results only by: [place] (if you have MyChart) OR A paper copy in the mail If you have any lab test that is abnormal or we need to change your treatment, we will call you to review the results.   Testing/Procedures: NONE   Follow-Up: At [facility], you and your health needs are our priority.  As part of our continuing mission to provide you with exceptional heart care, we have created designated Provider Care Teams.  These Care Teams include your primary Cardiologist (physician) and Advanced Practice Providers (APPs -  Physician Assistants and Nurse Practitioners) who all work together to provide you with the care you need, when you need it.  Your next appointment:   1 year(s)  The format for your next appointment:   In Person  Provider:   Mertie Moores, MD    Important Information About Sugar

## 2022-10-05 ENCOUNTER — Encounter: Payer: Self-pay | Admitting: Gastroenterology

## 2022-10-05 ENCOUNTER — Telehealth: Payer: Self-pay | Admitting: Internal Medicine

## 2022-10-05 ENCOUNTER — Ambulatory Visit: Payer: Medicare PPO | Admitting: Gastroenterology

## 2022-10-05 VITALS — BP 142/82 | HR 81 | Ht 63.0 in | Wt 169.0 lb

## 2022-10-05 DIAGNOSIS — K7581 Nonalcoholic steatohepatitis (NASH): Secondary | ICD-10-CM

## 2022-10-05 DIAGNOSIS — Z8719 Personal history of other diseases of the digestive system: Secondary | ICD-10-CM | POA: Diagnosis not present

## 2022-10-05 DIAGNOSIS — R188 Other ascites: Secondary | ICD-10-CM | POA: Diagnosis not present

## 2022-10-05 DIAGNOSIS — K5909 Other constipation: Secondary | ICD-10-CM | POA: Diagnosis not present

## 2022-10-05 DIAGNOSIS — E871 Hypo-osmolality and hyponatremia: Secondary | ICD-10-CM | POA: Diagnosis not present

## 2022-10-05 DIAGNOSIS — K7460 Unspecified cirrhosis of liver: Secondary | ICD-10-CM

## 2022-10-05 DIAGNOSIS — Z86010 Personal history of colonic polyps: Secondary | ICD-10-CM | POA: Diagnosis not present

## 2022-10-05 NOTE — Patient Instructions (Signed)
Take miralax 1 capful daily with 8oz water. Please follow up in 6 months. Give [place] a call at [phone] to schedule an appointment.  _______________________________________________________  If you are age 73 or older, your body mass index should be between 23-30. Your Body mass index is 29.94 kg/m. If this is out of the aforementioned range listed, please consider follow up with your Primary Care Provider.  If you are age 15 or younger, your body mass index should be between 19-25. Your Body mass index is 29.94 kg/m. If this is out of the aformentioned range listed, please consider follow up with your Primary Care Provider.   ________________________________________________________  The [facility] providers would like to encourage you to use [facility] to communicate with providers for non-urgent requests or questions.  Due to long hold times on the telephone, sending your provider a message by [facility] may be a faster and more efficient way to get a response.  Please allow 48 business hours for a response.  Please remember that this is for non-urgent requests.  _______________________________________________________  Due to recent changes in healthcare laws, you may see the results of your imaging and laboratory studies on MyChart before your provider has had a chance to review them.  We understand that in some cases there may be results that are confusing or concerning to you. Not all laboratory results come back in the same time frame and the provider may be waiting for multiple results in order to interpret others.  Please give [place] 48 hours in order for your provider to thoroughly review all the results before contacting the office for clarification of your results.    Thank you for choosing me and [facility].  Vito Cirigliano, D.O.

## 2022-10-05 NOTE — Progress Notes (Signed)
Chief Complaint:    Cirrhosis, ascites and medication management, change in bowel habits  GI History: 1.  History of adenomatous polyps in her colon.  Colonoscopy 2006 Dr. Timmothy Euler found no polyps.  Colonoscopy Dr. Ardis Hughs December 2016 found 7 subcentimeter polyps.  These were all adenomatous polyps on pathology.  Also documented left-sided diverticulosis.  Dr. Ardis Hughs recommended she have a repeat colonoscopy at 3-year interval.   Colonoscopy January 2020 four polyps were removed from her colon, all were adenomatous on pathology.  She was recommended to have repeat colonoscopy at 3-year interval.  Colonoscopy 03/2022 2 subcentimeter adenomas.  A 1 cm ulcer in the cecum was noted this did not appear malignant.  Biopsies showed no neoplasm, suspect from meloxicam she was taken for back pains.  Previous tattoo of colon area was normal. Recommended 5 year repeat.    2. Cirrhosis, likely from longstanding fatty liver disease, out-of-control diabetes.  Diagnosed 2019. - Lab workup 2019: Hep C Ab negative, Hep B Surface Ag negative, hepatitis B surface antibody negative, hep B Core Igm negative, Hep A IgM negative. Ferritin normal, HIV negative, ANA negative, antimitochondrial antibody negative, hepatitis a antibody total negative; slightly low platelets, transaminases slightly elevated.  No edema. - [place] 07/2018: slightly nodular, suspicious for cirrhosis. - [place] 12/2019 cirrhosis without mass lesions. - Ultrasound 08/2020 showed cirrhosis without focal mass lesions. Gallbladder thick but without Murphy sign. - Ultrasound May 2022 showed cirrhosis, trace perihepatic ascites, no focal lesions in the liver. - [place] 12/2021 nodular liver, no masses.  - EGD 12/2018 small distal esophagus varices, mild portal gastropathy, no gastric varices.  She was started on nadolol (eventually at 34m daily). - EGD 03/2022 small esophagus varices, mild portal gastropathy.  Biopsies of minimal gastritis showed no H. pylori - AFP 11/2021  normal - MELD 12 (11/2021 labs) - Hepatitis A, B vaccination series started 11/2018 -11/2021: Mild encephalopathy, memory disturbance, slight tremors, ammonia 65. Started lactulose which might have raised her blood sugars a bit. -07/29/2022 - 08/01/2022: Hospital admission for hyponatremia 2/2 hypovolemia.  NA 120 on admission, improved to 130s by DC.  Discharged home with salt tablets to hold diuretics (was actually diuretics prior to hospitalization) -07/29/2022: CT A/P: Cirrhotic appearing liver, no HCC.  Evidence of portal hypertension (varices, splenorenal shunt, small volume ascites, mild splenomegaly), multiple cystic lesions in the uncinate process of the pancreas measuring up to 4.1 cm favoring sidebranch IPMN's.  Patient was not found to be a candidate for EUS based on hyponatremia and recommended MRI repeat in 3 months - 08/04/22: GI follow-up with JEllouise Newer  Was still taking salt tablets.  Not taking any diuretics. - 08/09/2022: NA 136, ammonia 57 - 08/15/2022: Patient called to states she is accumulating fluid. - 08/15/2022: Abdominal ultrasound: New small to moderate ascites, GB wall thickening related to ascites/hepatic dysfunction.  Cirrhosis without HCC.  Restart Aldactone 25 mg qod and Lasix 10 mg qod (to take on alternating days).  Placed referral to Nephrology for medication intolerant ascites - 08/23/2022: NA 131  Cirrhosis Evaluation: - Etiology: NASH - Complications: Hepatic encephalopathy, hyponatremia, ascites, diuretic intolerance, esophageal varices, portal hypertensive gastropathy - HCC screening: UTD - Variceal screening: EGD 03/2022: Small esophageal varices - Serologic evaluation: Negative/normal as above - Viral hepatitis vaccination: UTD - Flu vaccine: UTD - Liver biopsy: None - Medications: Lactulose, metoprolol, Lasix 10 mg/day, Aldactone 25 mg/day - MELD 3.0: 20 - Child Pugh score: B   HPI:     Patient is a 71  y.o. female presenting to the  Gastroenterology Clinic for follow-up.  Last seen by me on 08/24/2022.  Main issue at that time was ascites and issues tolerating diuretics, mostly issues with hyponatremia.  Had increased Lasix to 10 mg/day and slowly increased Aldactone to 25 mg/day and referred to Nephrology for assistance  Abdominal ultrasound on 08/16/2019 with cirrhotic appearing liver, splenomegaly, small to moderate ascites, GB wall thickening 2/2 ascites/hepatic dysfunction.  No HCC.  Unchanged pancreatic/peripancreatic cysts.  Patient called in in mid October with increased ankle swelling and wt gain.  Lasix was increased to 20 mg/day and Aldactone to 50 mg/day.  Has tolerated the new dosing well.  Weight improving back to baseline and ankle edema has resolved.  -09/20/2022: NA 129, BUN/creatinine 13/0.6 (all stable on new diuretic dosing)  She was seen by Dr. Earlie Server in the Oncology Clinic on 09/28/2022 for follow-up of IDA with intolerance to oral iron and history of ITP.  H/H 10.6/30 with ferritin 23, iron 61, sat 13.7%.  Was given Venofer 300 mg weekly x3 weeks with plan to follow-up in 3 months and repeat labs.  Had follow-up with in the Cardiology Clinic on 10/03/2022.  No changes in medications.  Today, her main issue is changes in bowel habits.  She did have alternating constipation and loose stools.  Constipation is not a new issue for her- has had "forever". Occasional lower abdominal discomfort. Has used laxatives over the years. Was doing well with lactulose with 2-3 soft stools/day, but more recently constipation has returned over last 2 weeks or so. Now with 0-1 BM/day.  Increased lactulose to 2 doses/day yesterday.    Separately c/o yeast infection fo rhte last month. Now using clobetosol cream w/o improvement. Has f/u GYN for this issue next week.   Review of systems:     No chest pain, no SOB, no fevers, no urinary sx   Past Medical History:  Diagnosis Date   ALLERGIC RHINITIS 10/09/2007   Allergy     Arthritis    hips, spine   DIABETES MELLITUS, UNCONTROLLED 03/16/2009   FATIGUE 10/21/2008   FIBROMYALGIA 10/09/2007   GERD (gastroesophageal reflux disease)    diet  controlled, no meds   H/O vaginal hysterectomy    HYPERLIPIDEMIA 10/09/2007   diet controlled, no meds   HYPERTENSION 10/09/2007   Morton's neuroma    OBSTRUCTIVE SLEEP APNEA 10/09/2007   Palpitations    Plantar fasciitis    History - right   Sleep apnea    Uses CPAP   TRANSAMINASES, SERUM, ELEVATED 07/08/2009    Patient's surgical history, family medical history, social history, medications and allergies were all reviewed in Epic    Current Outpatient Medications  Medication Sig Dispense Refill   BD PEN NEEDLE NANO 2ND GEN 32G X 4 MM MISC USE AS DIRECTED 400 each 3   clobetasol cream (TEMOVATE) 5.62 % Apply 1 Application topically 2 (two) times daily as needed (yeast infection).     Continuous Blood Gluc Sensor (DEXCOM G7 SENSOR) MISC 1 Device by Does not apply route as directed. Every 10 days 9 each 3   escitalopram (LEXAPRO) 10 MG tablet TAKE 1 TABLET BY MOUTH EVERY DAY 90 tablet 0   furosemide (LASIX) 10 MG/ML solution Take 2 mLs (20 mg total) by mouth daily. 60 mL 1   furosemide (LASIX) 20 MG tablet Take 1 tablet (20 mg total) by mouth daily. 30 tablet 1   insulin glargine (LANTUS SOLOSTAR) 100 UNIT/ML Solostar Pen Inject 30 Units  into the skin at bedtime.     insulin lispro (HUMALOG KWIKPEN) 200 UNIT/ML KwikPen Inject 16-21 Units into the skin 3 (three) times daily with meals. Dose is based on CBG     lactulose, encephalopathy, (CHRONULAC) 10 GM/15ML SOLN Take by mouth 3 (three) times daily. 3-4 tablespoons 3 times a day     metFORMIN (GLUCOPHAGE) 1000 MG tablet TAKE 1 TABLET BY MOUTH TWICE A DAY 180 tablet 3   metoprolol tartrate (LOPRESSOR) 25 MG tablet Take 0.5 tablets (12.5 mg total) by mouth 2 (two) times daily. 90 tablet 3   omeprazole (PRILOSEC) 20 MG capsule Take 20 mg by mouth at bedtime.     sodium  chloride 1 g tablet Take 1 tablet (1 g total) by mouth 3 (three) times daily with meals. 30 tablet 0   spironolactone (ALDACTONE) 50 MG tablet Take 1 tablet (50 mg total) by mouth daily. 30 tablet 1   No current facility-administered medications for this visit.    Physical Exam:     BP (!) 142/82   Pulse 81   Ht 5' 3"  (1.6 m)   Wt 169 lb (76.7 kg)   BMI 29.94 kg/m   GENERAL:  Pleasant female in NAD PSYCH: : Cooperative, normal affect ABDOMEN:  Nondistended, soft, nontender. No obvious masses, no hepatomegaly,  normal bowel sounds SKIN:  turgor, no lesions seen Musculoskeletal:  Normal muscle tone, normal strength NEURO: Alert and oriented x 3, no focal neurologic deficits   IMPRESSION and PLAN:    1) NASH cirrhosis 2) Ascites 3) Esophageal varices 4) Portal hypertensive gastropathy 5) Hyponatremia  Tolerating diuretic regimen without issue and doing better clinically (ankle edema resolved, weight decreasing, no ascites).  BP doing well.   - Continue Lasix 20 mg daily - Continue Aldactone 50 mg qd - Check BMP in 5-6 weeks - Continue metoprolol as prescribed by her Cardiologist - Repeat EGD in 2025 for variceal surveillance - Low-sodium diet, <2 gm.  Discussed with her today - Increase lactulose to bid with a goal of 2-4 soft stools daily - Discussed potentially starting rifaximin if needed in the future.  Otherwise no recent HE episodes while on lactulose  6) Constipation By her history, longstanding history of chronic constipation which has been well controlled with lactulose 1 dose/day.  Discussed at length with her today to include medication options with plan for the following: - Increasing lactulose to bid as above with goal of 2-4 soft stools daily.  She otherwise has difficulty increasing lactulose to tid due to taste and dysgeusia with more frequent dosing - If symptoms not resolved with lactulose bid alone, will start MiraLAX 1 cap/day and titrate to effect -  Continue adequate hydration   7) History of colon polyps - Repeat colonoscopy in 2028  8) Iron deficiency anemia - Receiving Venofer - Continue follow-up with Hematology  RTC in 6 months or sooner as needed  I spent 35 minutes of time, including in depth chart review, independent review of results as outlined above, communicating results with the patient directly, face-to-face time with the patient, coordinating care, and ordering studies and medications as appropriate, and documentation.           [place] ,DO, FACG 10/05/2022, 10:30 AM

## 2022-10-06 ENCOUNTER — Ambulatory Visit (INDEPENDENT_AMBULATORY_CARE_PROVIDER_SITE_OTHER): Payer: Medicare PPO

## 2022-10-06 VITALS — BP 113/72 | HR 78 | Temp 98.2°F | Resp 16 | Ht 63.0 in | Wt 167.8 lb

## 2022-10-06 DIAGNOSIS — D500 Iron deficiency anemia secondary to blood loss (chronic): Secondary | ICD-10-CM

## 2022-10-06 MED ORDER — ACETAMINOPHEN 325 MG PO TABS
650.0000 mg | ORAL_TABLET | Freq: Once | ORAL | Status: AC
  Administered 2022-10-06: 650 mg via ORAL
  Filled 2022-10-06: qty 2

## 2022-10-06 MED ORDER — SODIUM CHLORIDE 0.9 % IV SOLN
300.0000 mg | INTRAVENOUS | Status: DC
  Administered 2022-10-06: 300 mg via INTRAVENOUS
  Filled 2022-10-06: qty 15

## 2022-10-06 MED ORDER — DIPHENHYDRAMINE HCL 25 MG PO CAPS
25.0000 mg | ORAL_CAPSULE | Freq: Once | ORAL | Status: AC
  Administered 2022-10-06: 25 mg via ORAL
  Filled 2022-10-06: qty 1

## 2022-10-06 NOTE — Progress Notes (Signed)
Diagnosis: Iron Deficiency Anemia  Provider:  Marshell Garfinkel MD  Procedure: Infusion  IV Type: Peripheral, IV Location: R Antecubital  Venofer (Iron Sucrose), Dose: 300 mg  Infusion Start Time: 3734  Infusion Stop Time: 2876  Post Infusion IV Care: Peripheral IV Discontinued  Discharge: Condition: Good, Destination: Home . AVS provided to patient.   Performed by:  Koren Shiver, RN

## 2022-10-10 DIAGNOSIS — B372 Candidiasis of skin and nail: Secondary | ICD-10-CM | POA: Diagnosis not present

## 2022-10-10 DIAGNOSIS — N819 Female genital prolapse, unspecified: Secondary | ICD-10-CM | POA: Diagnosis not present

## 2022-10-10 DIAGNOSIS — N898 Other specified noninflammatory disorders of vagina: Secondary | ICD-10-CM | POA: Diagnosis not present

## 2022-10-12 NOTE — Telephone Encounter (Signed)
Patient called requested to speak with you regarding an order for labs to be done.

## 2022-10-12 NOTE — Telephone Encounter (Signed)
Returned call to patient. She states that she has a hx of low sodium levels. She woke up this morning slightly confused and has vomited. Pt states that she just has not been feeling well all day. Wondering if you could check her labs now. Please advise, thanks.

## 2022-10-13 ENCOUNTER — Other Ambulatory Visit (INDEPENDENT_AMBULATORY_CARE_PROVIDER_SITE_OTHER): Payer: Medicare PPO

## 2022-10-13 ENCOUNTER — Ambulatory Visit (INDEPENDENT_AMBULATORY_CARE_PROVIDER_SITE_OTHER): Payer: Medicare PPO

## 2022-10-13 VITALS — BP 121/61 | HR 79 | Temp 98.5°F | Resp 18 | Ht 63.0 in | Wt 164.0 lb

## 2022-10-13 DIAGNOSIS — R188 Other ascites: Secondary | ICD-10-CM

## 2022-10-13 DIAGNOSIS — D500 Iron deficiency anemia secondary to blood loss (chronic): Secondary | ICD-10-CM | POA: Diagnosis not present

## 2022-10-13 DIAGNOSIS — K7581 Nonalcoholic steatohepatitis (NASH): Secondary | ICD-10-CM

## 2022-10-13 DIAGNOSIS — K7460 Unspecified cirrhosis of liver: Secondary | ICD-10-CM

## 2022-10-13 LAB — BASIC METABOLIC PANEL
BUN: 19 mg/dL (ref 6–23)
CO2: 25 mEq/L (ref 19–32)
Calcium: 9.6 mg/dL (ref 8.4–10.5)
Chloride: 91 mEq/L — ABNORMAL LOW (ref 96–112)
Creatinine, Ser: 0.75 mg/dL (ref 0.40–1.20)
GFR: 79.13 mL/min (ref >60.00)
Glucose, Bld: 132 mg/dL — ABNORMAL HIGH (ref 70–99)
Potassium: 4.3 mEq/L (ref 3.5–5.1)
Sodium: 126 mEq/L — ABNORMAL LOW (ref 135–145)

## 2022-10-13 MED ORDER — ACETAMINOPHEN 325 MG PO TABS
650.0000 mg | ORAL_TABLET | Freq: Once | ORAL | Status: AC
  Administered 2022-10-13: 650 mg via ORAL
  Filled 2022-10-13: qty 2

## 2022-10-13 MED ORDER — DIPHENHYDRAMINE HCL 25 MG PO CAPS
25.0000 mg | ORAL_CAPSULE | Freq: Once | ORAL | Status: AC
  Administered 2022-10-13: 25 mg via ORAL
  Filled 2022-10-13: qty 1

## 2022-10-13 MED ORDER — SODIUM CHLORIDE 0.9 % IV SOLN
300.0000 mg | INTRAVENOUS | Status: DC
  Administered 2022-10-13: 300 mg via INTRAVENOUS
  Filled 2022-10-13: qty 15

## 2022-10-13 NOTE — Progress Notes (Signed)
Diagnosis: Iron Deficiency Anemia  Provider:  Marshell Garfinkel MD  Procedure: Infusion  IV Type: Peripheral, IV Location: L Antecubital  Venofer (Iron Sucrose), Dose: 300 mg  Infusion Start Time: 9906  Infusion Stop Time: 8934  Post Infusion IV Care: Peripheral IV Discontinued  Discharge: Condition: Good, Destination: Home . AVS provided to patient.   Performed by:  Cleophus Molt, RN

## 2022-10-14 ENCOUNTER — Telehealth: Payer: Self-pay | Admitting: Gastroenterology

## 2022-10-14 NOTE — Telephone Encounter (Signed)
Patient called states her sodium is low and wants to know if the MD concerns. Requested a call back as soon as possible.

## 2022-10-14 NOTE — Telephone Encounter (Signed)
Spoke with pt and pt's husband. Pt stated that her sodium was 126 and wanted to know doctor's thoughts. Pt's husband reports that pt has been more confused lately and had nausea with vomiting 2 days ago. Pt vomited 3 times 2 days ago but has not had any nausea/vomiting since.  Dr. Silverio Decamp as DOD please advise.

## 2022-10-14 NOTE — Telephone Encounter (Signed)
Called patient and advised her to drink fluids to maintain hydration. She is no longer vomiting, is able to drink more fluids but doesn't have much appetite. Advised patient to come to ER if she develops recurrent vomiting or has severe lethargy or confusion.  The patient agreed with the plan and demonstrated an understanding of the instructions.

## 2022-10-15 ENCOUNTER — Telehealth: Payer: Self-pay | Admitting: Physician Assistant

## 2022-10-15 ENCOUNTER — Other Ambulatory Visit: Payer: Self-pay | Admitting: Gastroenterology

## 2022-10-15 ENCOUNTER — Emergency Department (HOSPITAL_BASED_OUTPATIENT_CLINIC_OR_DEPARTMENT_OTHER)
Admission: EM | Admit: 2022-10-15 | Discharge: 2022-10-15 | Disposition: A | Discharge status: Home / Self Care | Payer: Medicare PPO | Attending: Emergency Medicine | Admitting: Emergency Medicine

## 2022-10-15 ENCOUNTER — Encounter (HOSPITAL_BASED_OUTPATIENT_CLINIC_OR_DEPARTMENT_OTHER): Payer: Self-pay | Admitting: Emergency Medicine

## 2022-10-15 ENCOUNTER — Other Ambulatory Visit: Payer: Self-pay

## 2022-10-15 DIAGNOSIS — Z794 Long term (current) use of insulin: Secondary | ICD-10-CM | POA: Insufficient documentation

## 2022-10-15 DIAGNOSIS — R410 Disorientation, unspecified: Secondary | ICD-10-CM | POA: Diagnosis not present

## 2022-10-15 DIAGNOSIS — K7460 Unspecified cirrhosis of liver: Secondary | ICD-10-CM | POA: Insufficient documentation

## 2022-10-15 DIAGNOSIS — Z91040 Latex allergy status: Secondary | ICD-10-CM | POA: Diagnosis not present

## 2022-10-15 DIAGNOSIS — E871 Hypo-osmolality and hyponatremia: Secondary | ICD-10-CM | POA: Diagnosis not present

## 2022-10-15 DIAGNOSIS — E119 Type 2 diabetes mellitus without complications: Secondary | ICD-10-CM | POA: Insufficient documentation

## 2022-10-15 LAB — CBG MONITORING, ED: Glucose-Capillary: 116 mg/dL — ABNORMAL HIGH (ref 70–99)

## 2022-10-15 LAB — OSMOLALITY, URINE: Osmolality, Ur: 339 mOsm/kg (ref 300–900)

## 2022-10-15 LAB — CBC
HCT: 35.1 % — ABNORMAL LOW (ref 36.0–46.0)
Hemoglobin: 11.8 g/dL — ABNORMAL LOW (ref 12.0–15.0)
MCH: 29.8 pg (ref 26.0–34.0)
MCHC: 33.6 g/dL (ref 30.0–36.0)
MCV: 88.6 fL (ref 80.0–100.0)
Platelets: 110 10*3/uL — ABNORMAL LOW (ref 150–400)
RBC: 3.96 MIL/uL (ref 3.87–5.11)
RDW: 16.2 % — ABNORMAL HIGH (ref 11.5–15.5)
WBC: 6.5 10*3/uL (ref 4.0–10.5)
nRBC: 0 % (ref 0.0–0.2)

## 2022-10-15 LAB — LIPASE, BLOOD: Lipase: 44 U/L (ref 11–51)

## 2022-10-15 LAB — URINALYSIS, ROUTINE W REFLEX MICROSCOPIC
Bilirubin Urine: NEGATIVE
Glucose, UA: NEGATIVE mg/dL
Hgb urine dipstick: NEGATIVE
Ketones, ur: NEGATIVE mg/dL
Leukocytes,Ua: NEGATIVE
Nitrite: NEGATIVE
Protein, ur: NEGATIVE mg/dL
Specific Gravity, Urine: 1.009 (ref 1.005–1.030)
pH: 5 (ref 5.0–8.0)

## 2022-10-15 LAB — COMPREHENSIVE METABOLIC PANEL
ALT: 45 U/L — ABNORMAL HIGH (ref 0–44)
AST: 45 U/L — ABNORMAL HIGH (ref 15–41)
Albumin: 3.8 g/dL (ref 3.5–5.0)
Alkaline Phosphatase: 65 U/L (ref 38–126)
Anion gap: 10 (ref 5–15)
BUN: 18 mg/dL (ref 8–23)
CO2: 25 mmol/L (ref 22–32)
Calcium: 9.9 mg/dL (ref 8.9–10.3)
Chloride: 92 mmol/L — ABNORMAL LOW (ref 98–111)
Creatinine, Ser: 0.79 mg/dL (ref 0.44–1.00)
GFR, Estimated: >60 mL/min (ref >60)
Glucose, Bld: 216 mg/dL — ABNORMAL HIGH (ref 70–99)
Potassium: 4.3 mmol/L (ref 3.5–5.1)
Sodium: 127 mmol/L — ABNORMAL LOW (ref 135–145)
Total Bilirubin: 3.2 mg/dL — ABNORMAL HIGH (ref 0.3–1.2)
Total Protein: 7.1 g/dL (ref 6.5–8.1)

## 2022-10-15 LAB — AMMONIA: Ammonia: 83 umol/L — ABNORMAL HIGH (ref 9–35)

## 2022-10-15 LAB — PROTIME-INR
INR: 1.4 — ABNORMAL HIGH (ref 0.8–1.2)
Prothrombin Time: 17 seconds — ABNORMAL HIGH (ref 11.4–15.2)

## 2022-10-15 LAB — OSMOLALITY: Osmolality: 280 mOsm/kg (ref 275–295)

## 2022-10-15 NOTE — Discharge Instructions (Signed)
Based on your lab work today and discussion with your GI doctor the plan for today is to increase your lactulose to 4 times daily.  On reading your chart it looks like you are supposed to be taking 3 to 4 tablespoons 3 times a day right now so if you are not currently taking this amounts I would increase to the appropriate dosage, if you are currently taking it as prescribed then I would increase to 4 times daily, additionally they would like you to be having 2-3 bowel movements a day so I would begin taking some MiraLAX or another stool softener to help to increase the amount of bowel movements that you are having.  You do not need to make any large changes to your diet or liquid intake, however it may be more helpful to drink electrolyte containing fluids such as Pedialyte, Gatorade than plain water to help with your hyponatremia.  If you have not heard from GI for a follow-up appointment by Tuesday of next week I would recommend calling them to ask when they would like to see you.

## 2022-10-15 NOTE — ED Triage Notes (Signed)
Pt via pov from home with hyponatremia and possible high ammonia (this is a past problem). Pt was seen for confusion at pcp yesterday and labwork was taken; they called and told her to come to ED. Pt alert & oriented to self; was a bit confused about the year (2024 and then 2003). Pt accompanied by husband, who is her Mudlogger." NAD noted.

## 2022-10-15 NOTE — Telephone Encounter (Signed)
Received call from ER, Christian PA-C, in regards to patient of Dr. Bryan Lemma. Patient has not an alcoholic cirrhosis, has been having hyponatremia, adjusted diuretics. Called with some nausea and vomiting and worsening confusion. Went to med center, at med center patient's kidney function was normal, ammonia was slightly elevated at 83, compared to 57, sodium 27 compared to 129.  Glucose 216.  Normal kidney function, normal urine, normal lipase. CBC without leukocytosis, hemoglobin 11.8 and steady platelets 110. Pending PT/INR  Discussed with Christian, PA there, patient without evidence of infection, sodium low but patient's been chronically low, no signs of fluid overload, nontender stomach.  Make sure patient able to tolerate p.o. Discussed how much lactulose she was on at home to bowel movements, he will go find out. Could potentially try to treat outpatient, increase lactulose make sure patient is not constipated.  Could potentially try to add Xifaxan 550 mg twice daily.  Pending labs they may discharge patient, if that is the case please call Monday and set up an appointment acutely with someone in our office app or Dr. Bryan Lemma. If patient does get admitted please let us know if she goes to [facility] or [facility], and our team would be glad to see her.

## 2022-10-15 NOTE — ED Provider Notes (Signed)
[name] EMERGENCY DEPT Provider Note   CSN: [id number] Arrival date & time: 10/15/22  1217     History {Add pertinent medical, surgical, social history, OB history to HPI:1} Chief Complaint  Patient presents with   hyponatremia    Dana Dyer is a 71 y.o. female with past medical history significant for nonalcoholic cirrhosis, liver disease, ascites, previous hyponatremia who presents with concern for hyponatremia called by GI for given confusion. Hx of same previous hospitalized. Husband reports scattered thoughts, difficulty with train of thought. Denies abdominal pain. Had been previously taken off lasix 2/2 hyponatremia however patient had 16 lb weight gain in around a week, was placed back on both lasix and spironolactone. No fever, chills, recent nausea, vomiting.  HPI     Home Medications Prior to Admission medications   Medication Sig Start Date End Date Taking? Authorizing Provider  BD PEN NEEDLE NANO 2ND GEN 32G X 4 MM MISC USE AS DIRECTED 04/01/22   Shamleffer, Melanie Crazier, MD  clobetasol cream (TEMOVATE) 3.81 % Apply 1 Application topically 2 (two) times daily as needed (yeast infection).    [provider]  Continuous Blood Gluc Sensor (DEXCOM G7 SENSOR) MISC 1 Device by Does not apply route as directed. Every 10 days 05/09/22   Shamleffer, Melanie Crazier, MD  escitalopram (LEXAPRO) 10 MG tablet TAKE 1 TABLET BY MOUTH EVERY DAY 09/05/22   Burchette, Alinda Sierras, MD  furosemide (LASIX) 10 MG/ML solution Take 2 mLs (20 mg total) by mouth daily. 09/22/22   Cirigliano, Vito V, DO  furosemide (LASIX) 20 MG tablet Take 1 tablet (20 mg total) by mouth daily. 09/15/22   Pyrtle, Lajuan Lines, MD  insulin glargine (LANTUS SOLOSTAR) 100 UNIT/ML Solostar Pen Inject 30 Units into the skin at bedtime. 08/01/22   Aline August, MD  insulin lispro (HUMALOG KWIKPEN) 200 UNIT/ML KwikPen Inject 16-21 Units into the skin 3 (three) times daily with meals. Dose is based on  CBG 08/01/22   Aline August, MD  lactulose, encephalopathy, (CHRONULAC) 10 GM/15ML SOLN Take by mouth 3 (three) times daily. 3-4 tablespoons 3 times a day    [provider]  metFORMIN (GLUCOPHAGE) 1000 MG tablet TAKE 1 TABLET BY MOUTH TWICE A DAY 06/03/22   Shamleffer, Melanie Crazier, MD  metoprolol tartrate (LOPRESSOR) 25 MG tablet Take 0.5 tablets (12.5 mg total) by mouth 2 (two) times daily. 08/19/22   Nahser, Wonda Cheng, MD  omeprazole (PRILOSEC) 20 MG capsule Take 20 mg by mouth at bedtime.    [provider]  sodium chloride 1 g tablet Take 1 tablet (1 g total) by mouth 3 (three) times daily with meals. 08/01/22   Aline August, MD  spironolactone (ALDACTONE) 50 MG tablet Take 1 tablet (50 mg total) by mouth daily. 09/15/22   Pyrtle, Lajuan Lines, MD      Allergies    Amoxicillin-pot clavulanate, Codeine sulfate, Diflucan [fluconazole], Epinephrine, Erythromycin base, Invokana [canagliflozin], Irbesartan-hydrochlorothiazide, Victoza [liraglutide], Latex, and Ozempic (0.25 or 0.5 mg-dose) [semaglutide(0.25 or 0.58m-dos)]    Review of Systems   Review of Systems  All other systems reviewed and are negative.   Physical Exam Updated Vital Signs BP (!) 117/58 (BP Location: Left Arm)   Pulse 79   Resp (!) 22   Ht 5' 3"  (1.6 m)   Wt 72.6 kg   SpO2 99%   BMI 28.34 kg/m  Physical Exam Vitals and nursing note reviewed.  Constitutional:      General: She is not in acute  distress.    Appearance: Normal appearance.  HENT:     Head: Normocephalic and atraumatic.  Eyes:     General:        Right eye: No discharge.        Left eye: No discharge.  Cardiovascular:     Rate and Rhythm: Normal rate and regular rhythm.     Heart sounds: No murmur heard.    No friction rub. No gallop.  Pulmonary:     Effort: Pulmonary effort is normal.     Breath sounds: Normal breath sounds.  Abdominal:     General: Bowel sounds are normal.     Palpations: Abdomen is soft.  Skin:     General: Skin is warm and dry.     Capillary Refill: Capillary refill takes less than 2 seconds.  Neurological:     Mental Status: She is alert and oriented to person, place, and time.  Psychiatric:        Mood and Affect: Mood normal.        Behavior: Behavior normal.     ED Results / Procedures / Treatments   Labs (all labs ordered are listed, but only abnormal results are displayed) Labs Reviewed  COMPREHENSIVE METABOLIC PANEL - Abnormal; Notable for the following components:      Result Value   Sodium 127 (*)    Chloride 92 (*)    Glucose, Bld 216 (*)    AST 45 (*)    ALT 45 (*)    Total Bilirubin 3.2 (*)    All other components within normal limits  CBC - Abnormal; Notable for the following components:   Hemoglobin 11.8 (*)    HCT 35.1 (*)    RDW 16.2 (*)    Platelets 110 (*)    All other components within normal limits  AMMONIA - Abnormal; Notable for the following components:   Ammonia 83 (*)    All other components within normal limits  LIPASE, BLOOD  URINALYSIS, ROUTINE W REFLEX MICROSCOPIC    EKG None  Radiology No results found.  Procedures Procedures  {Document cardiac monitor, telemetry assessment procedure when appropriate:1}  Medications Ordered in ED Medications - No data to display  ED Course/ Medical Decision Making/ A&P Clinical Course as of 10/15/22 1647  Sat Oct 15, 2022  1507 Vicie Mutters with GI -- we will check urine and serum osms, PT-INR. Plan to increase lactulose as long as patient is having bowel movements, GI will see for close follow up [CP]  1639 INR minimally elevated but not critically high. Will plan to follow up with GI, give increased lactulose and increased stool softener as planned [CP]    Clinical Course User Index [CP] Anselmo Pickler, PA-C                           Medical Decision Making Amount and/or Complexity of Data Reviewed Labs: ordered.   This patient is a 71 y.o. female who presents to the ED  for concern of ***, this involves an extensive number of treatment options, and is a complaint that carries with it a high risk of complications and morbidity. The emergent differential diagnosis prior to evaluation includes, but is not limited to,  *** .   This is not an exhaustive differential.   Past Medical History / Co-morbidities / Social History: ***  Additional history: Chart reviewed. Pertinent results include: ***  Physical Exam: Physical exam performed. The  pertinent findings include: ***  Lab Tests: I ordered, and personally interpreted labs.  The pertinent results include:  ***   Imaging Studies: I ordered imaging studies including ***. I independently visualized and interpreted imaging which showed ***. I agree with the radiologist interpretation.   Cardiac Monitoring:  The patient was maintained on a cardiac monitor.  My attending physician Dr. Marland Kitchen viewed and interpreted the cardiac monitored which showed an underlying rhythm of: ***. I agree with this interpretation.   Medications: I ordered medication including ***  for ***. Reevaluation of the patient after these medicines showed that the patient {resolved/improved/worsened:23923::"improved"}. I have reviewed the patients home medicines and have made adjustments as needed.  Consultations Obtained: I requested consultation with the ***,  and discussed lab and imaging findings as well as pertinent plan - they recommend: ***   Disposition: After consideration of the diagnostic results and the patients response to treatment, I feel that *** .   ***emergency department workup does not suggest an emergent condition requiring admission or immediate intervention beyond what has been performed at this time. The plan is: ***. The patient is safe for discharge and has been instructed to return immediately for worsening symptoms, change in symptoms or any other concerns.  I discussed this case with my attending physician Dr.  Marland Kitchen who cosigned this note including patient's presenting symptoms, physical exam, and planned diagnostics and interventions. Attending physician stated agreement with plan or made changes to plan which were implemented.    Final Clinical Impression(s) / ED Diagnoses Final diagnoses:  None    Rx / DC Orders ED Discharge Orders     None

## 2022-10-15 NOTE — ED Notes (Signed)
Christian, PA-C in room w/pt now.

## 2022-10-17 ENCOUNTER — Other Ambulatory Visit: Payer: Self-pay

## 2022-10-17 DIAGNOSIS — K7460 Unspecified cirrhosis of liver: Secondary | ICD-10-CM

## 2022-10-17 DIAGNOSIS — E871 Hypo-osmolality and hyponatremia: Secondary | ICD-10-CM

## 2022-10-17 NOTE — Telephone Encounter (Signed)
Called pt and pt stated she is feeling much better. Pt is scheduled for f/u appointment with Alonza Bogus on 10/20/22 at 3:00 pm. Lab orders placed and pt stated she would be able to stop by lab on Wednesday.

## 2022-10-18 ENCOUNTER — Encounter: Payer: Medicare PPO | Admitting: Family Medicine

## 2022-10-18 ENCOUNTER — Ambulatory Visit: Payer: Medicare PPO | Admitting: Physician Assistant

## 2022-10-19 ENCOUNTER — Other Ambulatory Visit (INDEPENDENT_AMBULATORY_CARE_PROVIDER_SITE_OTHER): Payer: Medicare PPO

## 2022-10-19 DIAGNOSIS — E871 Hypo-osmolality and hyponatremia: Secondary | ICD-10-CM | POA: Diagnosis not present

## 2022-10-19 DIAGNOSIS — K7460 Unspecified cirrhosis of liver: Secondary | ICD-10-CM

## 2022-10-19 DIAGNOSIS — K7581 Nonalcoholic steatohepatitis (NASH): Secondary | ICD-10-CM | POA: Diagnosis not present

## 2022-10-19 LAB — CBC WITH DIFFERENTIAL/PLATELET
Basophils Absolute: 0 10*3/uL (ref 0.0–0.1)
Basophils Relative: 0.5 % (ref 0.0–3.0)
Eosinophils Absolute: 0.1 10*3/uL (ref 0.0–0.7)
Eosinophils Relative: 2 % (ref 0.0–5.0)
HCT: 35.6 % — ABNORMAL LOW (ref 36.0–46.0)
Hemoglobin: 12.2 g/dL (ref 12.0–15.0)
Lymphocytes Relative: 13.1 % (ref 12.0–46.0)
Lymphs Abs: 0.9 10*3/uL (ref 0.7–4.0)
MCHC: 34.4 g/dL (ref 30.0–36.0)
MCV: 89.6 fl (ref 78.0–100.0)
Monocytes Absolute: 1 10*3/uL (ref 0.1–1.0)
Monocytes Relative: 14.4 % — ABNORMAL HIGH (ref 3.0–12.0)
Neutro Abs: 4.7 10*3/uL (ref 1.4–7.7)
Neutrophils Relative %: 70 % (ref 43.0–77.0)
Platelets: 106 10*3/uL — ABNORMAL LOW (ref 150.0–400.0)
RBC: 3.97 Mil/uL (ref 3.87–5.11)
RDW: 17.5 % — ABNORMAL HIGH (ref 11.5–15.5)
WBC: 6.7 10*3/uL (ref 4.0–10.5)

## 2022-10-19 LAB — COMPREHENSIVE METABOLIC PANEL
ALT: 48 U/L — ABNORMAL HIGH (ref 0–35)
AST: 44 U/L — ABNORMAL HIGH (ref 0–37)
Albumin: 3.6 g/dL (ref 3.5–5.2)
Alkaline Phosphatase: 83 U/L (ref 39–117)
BUN: 16 mg/dL (ref 6–23)
CO2: 29 mEq/L (ref 19–32)
Calcium: 9.5 mg/dL (ref 8.4–10.5)
Chloride: 94 mEq/L — ABNORMAL LOW (ref 96–112)
Creatinine, Ser: 0.8 mg/dL (ref 0.40–1.20)
GFR: 73.23 mL/min (ref >60.00)
Glucose, Bld: 202 mg/dL — ABNORMAL HIGH (ref 70–99)
Potassium: 4.6 mEq/L (ref 3.5–5.1)
Sodium: 127 mEq/L — ABNORMAL LOW (ref 135–145)
Total Bilirubin: 2.4 mg/dL — ABNORMAL HIGH (ref 0.2–1.2)
Total Protein: 6.7 g/dL (ref 6.0–8.3)

## 2022-10-19 LAB — PROTIME-INR
INR: 1.2 ratio — ABNORMAL HIGH (ref 0.8–1.0)
Prothrombin Time: 13 s (ref 9.6–13.1)

## 2022-10-19 LAB — AMMONIA: Ammonia: 84 umol/L — ABNORMAL HIGH (ref 11–35)

## 2022-10-20 ENCOUNTER — Other Ambulatory Visit: Payer: Self-pay

## 2022-10-20 ENCOUNTER — Encounter: Payer: Self-pay | Admitting: Gastroenterology

## 2022-10-20 ENCOUNTER — Telehealth: Payer: Self-pay

## 2022-10-20 ENCOUNTER — Ambulatory Visit (INDEPENDENT_AMBULATORY_CARE_PROVIDER_SITE_OTHER): Payer: Medicare PPO

## 2022-10-20 ENCOUNTER — Ambulatory Visit: Payer: Medicare PPO | Admitting: Gastroenterology

## 2022-10-20 VITALS — BP 128/70 | HR 85 | Ht 63.0 in | Wt 165.1 lb

## 2022-10-20 VITALS — BP 125/78 | HR 81 | Temp 98.0°F | Resp 18 | Ht 63.0 in | Wt 163.8 lb

## 2022-10-20 DIAGNOSIS — K7460 Unspecified cirrhosis of liver: Secondary | ICD-10-CM

## 2022-10-20 DIAGNOSIS — K7469 Other cirrhosis of liver: Secondary | ICD-10-CM

## 2022-10-20 DIAGNOSIS — E1165 Type 2 diabetes mellitus with hyperglycemia: Secondary | ICD-10-CM | POA: Diagnosis not present

## 2022-10-20 DIAGNOSIS — K7682 Hepatic encephalopathy: Secondary | ICD-10-CM

## 2022-10-20 DIAGNOSIS — E871 Hypo-osmolality and hyponatremia: Secondary | ICD-10-CM

## 2022-10-20 DIAGNOSIS — D500 Iron deficiency anemia secondary to blood loss (chronic): Secondary | ICD-10-CM | POA: Diagnosis not present

## 2022-10-20 MED ORDER — SODIUM CHLORIDE 0.9 % IV SOLN
300.0000 mg | INTRAVENOUS | Status: DC
  Administered 2022-10-20: 300 mg via INTRAVENOUS
  Filled 2022-10-20: qty 15

## 2022-10-20 MED ORDER — DIPHENHYDRAMINE HCL 25 MG PO CAPS
25.0000 mg | ORAL_CAPSULE | Freq: Once | ORAL | Status: AC
  Administered 2022-10-20: 25 mg via ORAL
  Filled 2022-10-20: qty 1

## 2022-10-20 MED ORDER — ACETAMINOPHEN 325 MG PO TABS
650.0000 mg | ORAL_TABLET | Freq: Once | ORAL | Status: AC
  Administered 2022-10-20: 650 mg via ORAL
  Filled 2022-10-20: qty 2

## 2022-10-20 MED ORDER — RIFAXIMIN 550 MG PO TABS: 550.0000 mg | ORAL_TABLET | Freq: Two times a day (BID) | ORAL | 3 refills | Status: DC

## 2022-10-20 MED ORDER — FUROSEMIDE 10 MG/ML PO SOLN: 20.0000 mg | Freq: Every day | ORAL | 1 refills | Status: DC

## 2022-10-20 MED ORDER — ZINC GLUCONATE 50 MG PO TABS: 50.0000 mg | ORAL_TABLET | Freq: Every day | ORAL | 3 refills | Status: DC

## 2022-10-20 NOTE — Telephone Encounter (Signed)
-----   Message from Loralie Champagne, PA-C sent at 10/20/2022  4:08 PM EST ----- Please let the patient know that I discussed with Dr. Bryan Lemma.  He is agreeable to the zinc and the Xifaxan.  He would like to keep her on the Lasix 20 mg daily.  She can either have the liquid or the tablet form.  He said he is not really sure why it got put in as liquid last time, but we certainly can continue it that way if she wants to as long as cost with that is not an issue.  He said repeat labs in 4 weeks since we are not adjusting anything and things are stable.  She needs to see him back again in 3 months so please put a reminder in to contact her next month to schedule to see him in February.  Thank you,  Jess

## 2022-10-20 NOTE — Patient Instructions (Signed)
_______________________________________________________  If you are age 73 or older, your body mass index should be between 23-30. Your Body mass index is 29.25 kg/m. If this is out of the aforementioned range listed, please consider follow up with your Primary Care Provider.  If you are age 7 or younger, your body mass index should be between 19-25. Your Body mass index is 29.25 kg/m. If this is out of the aformentioned range listed, please consider follow up with your Primary Care Provider.   ________________________________________________________  The [facility] providers would like to encourage you to use [facility] to communicate with providers for non-urgent requests or questions.  Due to long hold times on the telephone, sending your provider a message by [facility] may be a faster and more efficient way to get a response.  Please allow 48 business hours for a response.  Please remember that this is for non-urgent requests.  _______________________________________________________  We have sent the following medications to your pharmacy for you to pick up at your convenience:  Xifaxan  Begin Zinc 38m daily

## 2022-10-20 NOTE — Progress Notes (Signed)
Diagnosis: Iron Deficiency Anemia  Provider:  Marshell Garfinkel MD  Procedure: Infusion  IV Type: Peripheral, IV Location: L Antecubital  Venofer (Iron Sucrose), Dose: 300 mg  Infusion Start Time: 4144  Infusion Stop Time: 3601  Post Infusion IV Care: Peripheral IV Discontinued  Discharge: Condition: Good, Destination: Home . AVS provided to patient.   Performed by:  Cleophus Molt, RN

## 2022-10-20 NOTE — Progress Notes (Signed)
10/20/2022 Dana Dyer [id number] [birth year]  GI History: 1.  History of adenomatous polyps in her colon.  Colonoscopy 2006 Dr. Timmothy Euler found no polyps.  Colonoscopy Dr. Ardis Hughs December 2016 found 7 subcentimeter polyps.  These were all adenomatous polyps on pathology.  Also documented left-sided diverticulosis.  Dr. Ardis Hughs recommended she have a repeat colonoscopy at 3-year interval.   Colonoscopy January 2020 four polyps were removed from her colon, all were adenomatous on pathology.  She was recommended to have repeat colonoscopy at 3-year interval.  Colonoscopy 03/2022 2 subcentimeter adenomas.  A 1 cm ulcer in the cecum was noted this did not appear malignant.  Biopsies showed no neoplasm, suspect from meloxicam she was taken for back pains.  Previous tattoo of colon area was normal. Recommended 5 year repeat.    2. Cirrhosis, likely from longstanding fatty liver disease, out-of-control diabetes.  Diagnosed 2019. - Lab workup 2019: Hep C Ab negative, Hep B Surface Ag negative, hepatitis B surface antibody negative, hep B Core Igm negative, Hep A IgM negative. Ferritin normal, HIV negative, ANA negative, antimitochondrial antibody negative, hepatitis a antibody total negative; slightly low platelets, transaminases slightly elevated.  No edema. - [place] 07/2018: slightly nodular, suspicious for cirrhosis. - [place] 12/2019 cirrhosis without mass lesions. - Ultrasound 08/2020 showed cirrhosis without focal mass lesions. Gallbladder thick but without Murphy sign. - Ultrasound May 2022 showed cirrhosis, trace perihepatic ascites, no focal lesions in the liver. - [place] 12/2021 nodular liver, no masses.  - EGD 12/2018 small distal esophagus varices, mild portal gastropathy, no gastric varices.  She was started on nadolol (eventually at 80m daily). - EGD 03/2022 small esophagus varices, mild portal gastropathy.  Biopsies of minimal gastritis showed no H. pylori - AFP 11/2021 normal - MELD 12 (11/2021 labs) -  Hepatitis A, B vaccination series started 11/2018 -11/2021: Mild encephalopathy, memory disturbance, slight tremors, ammonia 65. Started lactulose which might have raised her blood sugars a bit. -07/29/2022 - 08/01/2022: Hospital admission for hyponatremia 2/2 hypovolemia.  NA 120 on admission, improved to 130s by DC.  Discharged home with salt tablets to hold diuretics (was actually diuretics prior to hospitalization) -07/29/2022: CT A/P: Cirrhotic appearing liver, no HCC.  Evidence of portal hypertension (varices, splenorenal shunt, small volume ascites, mild splenomegaly), multiple cystic lesions in the uncinate process of the pancreas measuring up to 4.1 cm favoring sidebranch IPMN's.  Patient was not found to be a candidate for EUS based on hyponatremia and recommended MRI repeat in 3 months - 08/04/22: GI follow-up with JEllouise Newer  Was still taking salt tablets.  Not taking any diuretics. - 08/09/2022: NA 136, ammonia 57 - 08/15/2022: Patient called to states she is accumulating fluid. - 08/15/2022: Abdominal ultrasound: New small to moderate ascites, GB wall thickening related to ascites/hepatic dysfunction.  Cirrhosis without HCC.  Restart Aldactone 25 mg qod and Lasix 10 mg qod (to take on alternating days).  Placed referral to Nephrology for medication intolerant ascites - 08/23/2022: NA 131   Cirrhosis Evaluation: - Etiology: NASH - Complications: Hepatic encephalopathy, hyponatremia, ascites, diuretic intolerance, esophageal varices, portal hypertensive gastropathy - HCC screening: UTD - Variceal screening: EGD 03/2022: Small esophageal varices - Serologic evaluation: Negative/normal as above - Viral hepatitis vaccination: UTD - Flu vaccine: UTD - Liver biopsy: None - Medications: Lactulose, metoprolol, Lasix 20 mg/day, Aldactone 50 mg/day - MELD 3.0: 20 - Child Pugh score: B   HISTORY OF PRESENT ILLNESS: Patient is here for follow-up.  She just saw Dr.  Cirigliano on 11/1.  She was in  the emergency department on 11/11 after having episodes of vomiting.  All of her labs were stable, but ammonia level was up to 85.  She was discharged home after discussing with our team.  She another episode of vomiting yesterday.  Labs yesterday showed her ammonia level 84 as well.  Otherwise sodium, etc. has been holding stable.  She tells me that she received a dose of Venofer today.  She is using her lactulose, what she says is 2 teaspoons 3 times daily.  Says that she is prone to yeast infections when I brought up about the [place].  Is on an Lasix 20 mg daily and spironolactone 50 mg daily.   Past Medical History:  Diagnosis Date   ALLERGIC RHINITIS 10/09/2007   Allergy    Arthritis    hips, spine   DIABETES MELLITUS, UNCONTROLLED 03/16/2009   FATIGUE 10/21/2008   FIBROMYALGIA 10/09/2007   GERD (gastroesophageal reflux disease)    diet  controlled, no meds   H/O vaginal hysterectomy    HYPERLIPIDEMIA 10/09/2007   diet controlled, no meds   HYPERTENSION 10/09/2007   Morton's neuroma    OBSTRUCTIVE SLEEP APNEA 10/09/2007   Palpitations    Plantar fasciitis    History - right   Sleep apnea    Uses CPAP   TRANSAMINASES, SERUM, ELEVATED 07/08/2009   Past Surgical History:  Procedure Laterality Date   ABDOMINAL HYSTERECTOMY  1979   prolapsed uterus   BASAL CELL CARCINOMA EXCISION     BREAST BIOPSY Left 2012   left 2012 stereo   BREAST REDUCTION SURGERY Bilateral 1992   COLONOSCOPY  2006   [place]   mamoplasty     reduction   REDUCTION MAMMAPLASTY Bilateral [place]    reports that she has never smoked. She has never used smokeless tobacco. She reports that she does not drink alcohol and does not use drugs. family history includes Alcohol abuse in her father; Arthritis in her father and mother; Breast cancer in her maternal aunt; Breast cancer (age of onset: 8) in her mother; Cancer in her mother; Diabetes in her mother;  Hypertension in her maternal grandfather; Miscarriages / [place] in her paternal grandfather. Allergies  Allergen Reactions   Amoxicillin-Pot Clavulanate     Severe diarrhea   Codeine Sulfate     REACTION: "hyper"   Diflucan [Fluconazole] Hives   Epinephrine Other (See Comments)    Racing heart rate   Erythromycin Base Diarrhea and Other (See Comments)    cramps   Invokana [Canagliflozin] Other (See Comments)    Recurrent yeast infections   Irbesartan-Hydrochlorothiazide Other (See Comments)    Unknown reaction   Victoza [Liraglutide] Nausea Only   Latex Rash    Long term use   Ozempic (0.25 Or 0.5 Mg-Dose) [Semaglutide(0.25 Or 0.35m-Dos)] Nausea Only      Outpatient Encounter Medications as of 10/20/2022  Medication Sig   BD PEN NEEDLE NANO 2ND GEN 32G X 4 MM MISC USE AS DIRECTED   clobetasol cream (TEMOVATE) 09.60% Apply 1 Application topically 2 (two) times daily as needed (yeast infection).   Continuous Blood Gluc Sensor (DEXCOM G7 SENSOR) MISC 1 Device by Does not apply route as directed. Every 10 days   escitalopram (LEXAPRO) 10 MG tablet TAKE 1 TABLET BY MOUTH EVERY DAY   furosemide (LASIX) 10 MG/ML solution Take 2 mLs (20 mg total) by mouth daily.  furosemide (LASIX) 20 MG tablet Take 1 tablet (20 mg total) by mouth daily.   insulin glargine (LANTUS SOLOSTAR) 100 UNIT/ML Solostar Pen Inject 30 Units into the skin at bedtime.   insulin lispro (HUMALOG KWIKPEN) 200 UNIT/ML KwikPen Inject 16-21 Units into the skin 3 (three) times daily with meals. Dose is based on CBG   lactulose, encephalopathy, (CHRONULAC) 10 GM/15ML SOLN Take by mouth 3 (three) times daily. 3-4 tablespoons 3 times a day   metFORMIN (GLUCOPHAGE) 1000 MG tablet TAKE 1 TABLET BY MOUTH TWICE A DAY   metoprolol tartrate (LOPRESSOR) 25 MG tablet Take 0.5 tablets (12.5 mg total) by mouth 2 (two) times daily.   omeprazole (PRILOSEC) 20 MG capsule Take 20 mg by mouth at bedtime.   sodium chloride 1 g tablet  Take 1 tablet (1 g total) by mouth 3 (three) times daily with meals.   spironolactone (ALDACTONE) 50 MG tablet Take 1 tablet (50 mg total) by mouth daily.   [DISCONTINUED] iron sucrose (VENOFER) 300 mg in sodium chloride 0.9 % 250 mL IVPB    No facility-administered encounter medications on file as of 10/20/2022.     REVIEW OF SYSTEMS  : All other systems reviewed and negative except where noted in the History of Present Illness.   PHYSICAL EXAM: BP 128/70   Pulse 85   Ht 5' 3"  (1.6 m)   Wt 165 lb 2 oz (74.9 kg)   BMI 29.25 kg/m  General: Well developed white female in no acute distress Head: Normocephalic and atraumatic Eyes:  Sclerae anicteric, conjunctiva pink. Ears: Normal auditory acuity Lungs: Clear throughout to auscultation; no W/R/R. Heart: Regular rate and rhythm; no M/R/G. Abdomen: Soft, non-distended.  BS present.  Non-tender. Musculoskeletal: Symmetrical with no gross deformities  Skin: No lesions on visible extremities Extremities: No edema  Neurological: Alert oriented x 4, grossly non-focal; no asterixis Psychological:  Alert and cooperative. Normal mood and affect  ASSESSMENT AND PLAN: 1) NASH cirrhosis 2) Ascites 3) Esophageal varices 4) Portal hypertensive gastropathy 5) Hyponatremia 6) HE   Tolerating diuretic regimen without issue and doing better clinically (ankle edema resolved, weight decreasing, no ascites).  BP doing well.   - Continue Lasix 20 mg daily - Continue Aldactone 50 mg qd - Check BMP in 4 weeks - Continue metoprolol as prescribed by her Cardiologist - Repeat EGD in 2025 for variceal surveillance - Low-sodium diet, <2 gm.  - Continue lactulose with a goal of 2-4 soft stools daily --Will add zinc 50 mg daily --Will try to get xifaxan 550 mg BID.  Hopefully yeast infections will not be an issue with this, but she says that she is prone to them.   6) Constipation By her history, longstanding history of chronic constipation which has  no longer been an issue with lactulose.    7) History of colon polyps - Repeat colonoscopy in 2028   8) Iron deficiency anemia - Receiving Venofer.  Had a dose today. - Continue follow-up with Hematology  MELD 3.0: 19 at 10/19/2022 12:26 PM MELD-Na: 21 at 10/19/2022 12:26 PM Calculated from: Serum Creatinine: 0.80 mg/dL (Using min of 1 mg/dL) at 10/19/2022 12:26 PM Serum Sodium: 127 mEq/L at 10/19/2022 12:26 PM Total Bilirubin: 2.4 mg/dL at 10/19/2022 12:26 PM Serum Albumin: 3.6 g/dL (Using max of 3.5 g/dL) at 10/19/2022 12:26 PM INR(ratio): 1.2 ratio at 10/19/2022 12:26 PM Age at listing (hypothetical): 27 years Sex: Female at 10/19/2022 12:26 PM   **Follow-up with Dr. Bryan Lemma in 3 months.  CC:  Dana Post, MD

## 2022-10-20 NOTE — Telephone Encounter (Signed)
Kaira RN will call the pt (she has information from Dr Bryan Lemma as well to discuss with the pt.

## 2022-10-21 ENCOUNTER — Ambulatory Visit
Admission: RE | Admit: 2022-10-21 | Discharge: 2022-10-21 | Disposition: A | Discharge status: Home / Self Care | Payer: Medicare PPO | Source: Ambulatory Visit | Attending: Family Medicine | Admitting: Family Medicine

## 2022-10-21 DIAGNOSIS — Z1231 Encounter for screening mammogram for malignant neoplasm of breast: Secondary | ICD-10-CM

## 2022-10-21 NOTE — Progress Notes (Signed)
Agree with the assessment and plan as outlined by Jessica Zehr, PA-C. ? ? , DO, FACG ? ?

## 2022-10-25 ENCOUNTER — Telehealth: Payer: Self-pay | Admitting: Pharmacy Technician

## 2022-10-25 ENCOUNTER — Other Ambulatory Visit (HOSPITAL_COMMUNITY): Payer: Self-pay

## 2022-10-25 ENCOUNTER — Ambulatory Visit: Payer: Medicare PPO | Admitting: Internal Medicine

## 2022-10-25 NOTE — Telephone Encounter (Signed)
Patient Advocate Encounter  Received notification from [facility] that prior authorization for [facility] is required.   PA submitted on 11.16.23 Key [id number] Status is pending     R , CPhT

## 2022-10-25 NOTE — Telephone Encounter (Signed)
Pharmacy Patient Advocate Encounter  Prior Authorization for XIFAXIN 550 MG has been approved.    PA# [id number] Effective dates: 12/05/2021 through 12/05/2023

## 2022-10-25 NOTE — Progress Notes (Deleted)
Name: Dana Dyer  Age/ Sex: 71 y.o., female   MRN/ DOB: [id number], Jun 27, 1951     PCP: Eulas Post, MD   Reason for Endocrinology Evaluation: Type 2 Diabetes Mellitus  Initial Endocrine Consultative Visit: 10/23/18    PATIENT IDENTIFIER: Dana Dyer is a 71 y.o. female with a past medical history of HTN, T2DM, Cirrhosis (Dx 2019), Fibromyalgia and dyslipidemia . The patient has followed with Endocrinology clinic since 10/23/18 for consultative assistance with management of her diabetes.  DIABETIC HISTORY:  Dana Dyer was diagnosed with T2DM in 2005. She used to be on SU but does not recall intolerance. She is intolerant to victoza - nauea and invokana- yeast infection. She was started on insulin therapy in 2009. Her hemoglobin A1c has ranged from 7.8 % in 2012, peaking at 10.9% in 2018.   Ozempic was stopped in 06/2019 due to persistent nausea.   Switched pravastatin to crestor 09/2019  SUBJECTIVE:   During the last visit (06/24/2022): A1c was 6.8%.   Today (10/25/2022): Dana Dyer is here for a follow up on diabetes management.  She checks her blood sugars multiple times a day through CGM.  The patient has not had hypoglycemic episodes since the last clinic visit per   Continues to follow up with Gi for cirrhosis  Has heartburn symptoms   She has been following up with cardiology for dizziness and near syncope Follows with sports medicine for left knee pain and chronic back pain   HOME DIABETES REGIMEN:  Lantus 30 units daily Humalog 12 units with Breakfast, 16 units with lunch and 18 units with supper Metformin 1000 mg BID  CF: Humalog (BG-130/25)    CONTINUOUS GLUCOSE MONITORING RECORD INTERPRETATION    Dates of Recording: 7/8-7/21/2023  Sensor description:dexcom  Results statistics:   CGM use % of time 93  Average and SD 159/43  Time in range 73%  % Time Above 180 23  % Time above 250 3  % Time Below target <1      Glycemic  patterns summary: Bg's optimal overnight, with hyperglycemia noted during the day   Hyperglycemic episodes  postprandial   Hypoglycemic episodes occurred  n/a  Overnight periods: optimally     DIABETIC COMPLICATIONS: Microvascular complications:  Neuroathy Denies: retinopathy, nephropathy Last eye exam: Completed 09/06/2021     Macrovascular complications:   Denies: CAD, PVD, CVA    HISTORY:  Past Medical History:  Past Medical History:  Diagnosis Date   ALLERGIC RHINITIS 10/09/2007   Allergy    Arthritis    hips, spine   DIABETES MELLITUS, UNCONTROLLED 03/16/2009   FATIGUE 10/21/2008   FIBROMYALGIA 10/09/2007   GERD (gastroesophageal reflux disease)    diet  controlled, no meds   H/O vaginal hysterectomy    HYPERLIPIDEMIA 10/09/2007   diet controlled, no meds   HYPERTENSION 10/09/2007   Morton's neuroma    OBSTRUCTIVE SLEEP APNEA 10/09/2007   Palpitations    Plantar fasciitis    History - right   Sleep apnea    Uses CPAP   TRANSAMINASES, SERUM, ELEVATED 07/08/2009   Past Surgical History:  Past Surgical History:  Procedure Laterality Date   ABDOMINAL HYSTERECTOMY  1979   prolapsed uterus   BASAL CELL CARCINOMA EXCISION     BREAST BIOPSY Left 2012   left 2012 stereo   BREAST REDUCTION SURGERY Bilateral 1992   COLONOSCOPY  2006   [place]   mamoplasty     reduction   REDUCTION MAMMAPLASTY Bilateral  [place]   Social History:  reports that she has never smoked. She has never used smokeless tobacco. She reports that she does not drink alcohol and does not use drugs. Family History:  Family History  Problem Relation Age of Onset   Diabetes Mother    Arthritis Mother    Cancer Mother        breast   Breast cancer Mother 33   Alcohol abuse Father    Arthritis Father    Hypertension Maternal Grandfather    Miscarriages / Stillbirths Paternal Grandfather    Breast cancer Maternal Aunt        diagnosed in her  15's   Colon polyps Neg Hx    Colon cancer Neg Hx    Esophageal cancer Neg Hx    Rectal cancer Neg Hx    Stomach cancer Neg Hx      HOME MEDICATIONS: Allergies as of 10/25/2022       Reactions   Amoxicillin-pot Clavulanate    Severe diarrhea   Codeine Sulfate    REACTION: "hyper"   Diflucan [fluconazole] Hives   Epinephrine Other (See Comments)   Racing heart rate   Erythromycin Base Diarrhea, Other (See Comments)   cramps   Invokana [canagliflozin] Other (See Comments)   Recurrent yeast infections   Irbesartan-hydrochlorothiazide Other (See Comments)   Unknown reaction   Victoza [liraglutide] Nausea Only   Latex Rash   Long term use   Ozempic (0.25 Or 0.5 Mg-dose) [semaglutide(0.25 Or 0.52m-dos)] Nausea Only        Medication List        Accurate as of October 25, 2022 10:15 AM. If you have any questions, ask your nurse or doctor.          BD Pen Needle Nano 2nd Gen 32G X 4 MM Misc Generic drug: Insulin Pen Needle USE AS DIRECTED   clobetasol cream 0.05 % Commonly known as: TEMOVATE Apply 1 Application topically 2 (two) times daily as needed (yeast infection).   Dexcom G7 Sensor Misc 1 Device by Does not apply route as directed. Every 10 days   escitalopram 10 MG tablet Commonly known as: LEXAPRO TAKE 1 TABLET BY MOUTH EVERY DAY   furosemide 10 MG/ML solution Commonly known as: LASIX Take 2 mLs (20 mg total) by mouth daily.   HumaLOG KwikPen 200 UNIT/ML KwikPen Generic drug: insulin lispro Inject 16-21 Units into the skin 3 (three) times daily with meals. Dose is based on CBG   lactulose (encephalopathy) 10 GM/15ML Soln Commonly known as: CHRONULAC Take by mouth 3 (three) times daily. 3-4 tablespoons 3 times a day   Lantus SoloStar 100 UNIT/ML Solostar Pen Generic drug: insulin glargine Inject 30 Units into the skin at bedtime.   metFORMIN 1000 MG tablet Commonly known as: GLUCOPHAGE TAKE 1 TABLET BY MOUTH TWICE A DAY   metoprolol  tartrate 25 MG tablet Commonly known as: LOPRESSOR Take 0.5 tablets (12.5 mg total) by mouth 2 (two) times daily.   omeprazole 20 MG capsule Commonly known as: PRILOSEC Take 20 mg by mouth at bedtime.   rifaximin 550 MG Tabs tablet Commonly known as: XIFAXAN Take 1 tablet (550 mg total) by mouth 2 (two) times daily.   sodium chloride 1 g tablet Take 1 tablet (1 g total) by mouth 3 (three) times daily with meals.   spironolactone 50 MG tablet Commonly known as: Aldactone Take 1 tablet (50 mg total) by  mouth daily.   zinc gluconate 50 MG tablet Take 1 tablet (50 mg total) by mouth daily.         OBJECTIVE:   Vital Signs: There were no vitals taken for this visit.  Wt Readings from Last 3 Encounters:  10/20/22 165 lb 2 oz (74.9 kg)  10/20/22 163 lb 12.8 oz (74.3 kg)  10/15/22 160 lb (72.6 kg)     Exam: General: Pt appears well and is in NAD  Lungs: Clear with good BS bilat with no rales, rhonchi, or wheezes  Heart: RRR   Abdomen: Normoactive bowel sounds, soft, nontender, without masses or organomegaly palpable  Extremities: No  pretibial edema.  Neuro: MS is good with appropriate affect, pt is alert and Ox3   DM Foot 12/15/2021 The skin of the feet is intact without sores or ulcerations. The pedal pulses are 2+ on right and 2+ on left. The sensation is intact   to a screening 5.07, 10 gram monofilament on the left      DATA REVIEWED:  Lab Results  Component Value Date   HGBA1C 6.8 (A) 06/24/2022   HGBA1C 6.7 (A) 03/17/2022   HGBA1C 7.3 (A) 12/15/2021    Latest Reference Range & Units 05/27/22 10:19  Sodium 135 - 145 mEq/L 130 (L)  Potassium 3.5 - 5.1 mEq/L 4.5  Chloride 96 - 112 mEq/L 97  CO2 19 - 32 mEq/L 23  Glucose 70 - 99 mg/dL 134 (H)  BUN 6 - 23 mg/dL 17  Creatinine 0.40 - 1.20 mg/dL 0.83  Calcium 8.4 - 10.5 mg/dL 9.7  Alkaline Phosphatase 39 - 117 U/L 89  Albumin 3.5 - 5.2 g/dL 3.7  AST 0 - 37 U/L 43 (H)  ALT 0 - 35 U/L 42 (H)  Total  Protein 6.0 - 8.3 g/dL 7.0  Total Bilirubin 0.2 - 1.2 mg/dL 1.7 (H)  GFR >60.00 mL/min 70.26     ASSESSMENT / PLAN / RECOMMENDATIONS:   1) Type 2 Diabetes Mellitus, OPtimally controlled, With neuropathic complications - Most recent A1c of 6.8 %. Goal A1c < 7.5 %.   -A1c remains at goal -She has been using less insulin than previously prescribed -No changes at this time but I have encouraged her to use correction scale if needed before each meal - She is intolerant to GLP-1 agonists due to GI side effects and SGLT-2 inhibitors due to recurrent yeast infection     MEDICATIONS: -Continue Lantus 30 units daily -Continue Humalog 12 units with breakfast, 16 units with Lunch and 18 units with supper  -Continue metformin 1000 mg BID  - CF : HUmalog ( BG -130/25)    EDUCATION / INSTRUCTIONS: BG monitoring instructions: Patient is instructed to check her blood sugars 4 times a day, before meals and bedtime. Call [facility] if: BG persistently < 70  I reviewed the Rule of 15 for the treatment of hypoglycemia in detail with the patient. Literature supplied.      F/U in 4 months  Signed electronically by: Mack Guise, MD  [facility] Endocrinology  [address] [place], [place] [address] Phone: [phone] FAX: [phone]   CC: Eulas Post, [place] [address] Phone: [phone]  Fax: [phone]  Return to Endocrinology clinic as below: Future Appointments  Date Time Provider [place]  10/25/2022  1:20 PM , Melanie Crazier, MD [facility] None  08/15/2023  1:00 PM [place] [facility]

## 2022-10-31 ENCOUNTER — Telehealth: Payer: Self-pay

## 2022-10-31 NOTE — Telephone Encounter (Signed)
        Patient  visited [facility] on 10/15/2022  for Hypo-osmolality and hyponatremia.   Telephone encounter attempt :  1st  A HIPAA compliant voice message was left requesting a return call.  Instructed patient to call back at [phone].   [place] Resource Care Guide   [email or web address]  ?? [phone]   Website: [email or web address]  [email or web address]

## 2022-11-01 ENCOUNTER — Telehealth: Payer: Self-pay

## 2022-11-01 NOTE — Telephone Encounter (Signed)
        Patient  visited [facility] on 10/15/2022  for Hypo-osmolality and hyponatremia.   Telephone encounter attempt :  2nd  A HIPAA compliant voice message was left requesting a return call.  Instructed patient to call back at [phone].   [place] Resource Care Guide   [email or web address]  ?? [phone]   Website: [email or web address]  [email or web address]

## 2022-11-02 ENCOUNTER — Telehealth: Payer: Self-pay

## 2022-11-02 NOTE — Telephone Encounter (Signed)
        Patient  visited [facility] on 10/15/2022  for Hypo-osmolality and hyponatremia.   Telephone encounter attempt :  3rd  A HIPAA compliant voice message was left requesting a return call.  Instructed patient to call back at [phone].   [place] Resource Care Guide   [email or web address]  ?? [phone]   Website: [email or web address]  [email or web address]

## 2022-11-03 ENCOUNTER — Telehealth: Payer: Self-pay

## 2022-11-03 NOTE — Telephone Encounter (Signed)
Left message for patient to call back. Scheduled patient for OV with Dr. Bryan Lemma on 01/18/23 at 1:20 pm for follow up.

## 2022-11-03 NOTE — Telephone Encounter (Signed)
Confirmed appointment with patient. She's also been reminded of when to come in for labs in a couple of weeks. Pt verbalized all understanding.

## 2022-11-10 ENCOUNTER — Other Ambulatory Visit: Payer: Self-pay | Admitting: Gastroenterology

## 2022-11-10 ENCOUNTER — Other Ambulatory Visit: Payer: Self-pay

## 2022-11-10 MED ORDER — SPIRONOLACTONE 50 MG PO TABS: 50.0000 mg | ORAL_TABLET | Freq: Every day | ORAL | 1 refills | Status: DC

## 2022-11-15 ENCOUNTER — Other Ambulatory Visit (INDEPENDENT_AMBULATORY_CARE_PROVIDER_SITE_OTHER): Payer: Medicare PPO

## 2022-11-15 DIAGNOSIS — K7460 Unspecified cirrhosis of liver: Secondary | ICD-10-CM

## 2022-11-15 DIAGNOSIS — E871 Hypo-osmolality and hyponatremia: Secondary | ICD-10-CM | POA: Diagnosis not present

## 2022-11-15 DIAGNOSIS — K7581 Nonalcoholic steatohepatitis (NASH): Secondary | ICD-10-CM | POA: Diagnosis not present

## 2022-11-15 LAB — COMPREHENSIVE METABOLIC PANEL
ALT: 54 U/L — ABNORMAL HIGH (ref 0–35)
AST: 45 U/L — ABNORMAL HIGH (ref 0–37)
Albumin: 3.6 g/dL (ref 3.5–5.2)
Alkaline Phosphatase: 93 U/L (ref 39–117)
BUN: 19 mg/dL (ref 6–23)
CO2: 22 mEq/L (ref 19–32)
Calcium: 9.8 mg/dL (ref 8.4–10.5)
Chloride: 96 mEq/L (ref 96–112)
Creatinine, Ser: 0.84 mg/dL (ref 0.40–1.20)
GFR: 69.03 mL/min (ref >60.00)
Glucose, Bld: 182 mg/dL — ABNORMAL HIGH (ref 70–99)
Potassium: 4.3 mEq/L (ref 3.5–5.1)
Sodium: 130 mEq/L — ABNORMAL LOW (ref 135–145)
Total Bilirubin: 2.2 mg/dL — ABNORMAL HIGH (ref 0.2–1.2)
Total Protein: 6.8 g/dL (ref 6.0–8.3)

## 2022-11-15 LAB — CBC WITH DIFFERENTIAL/PLATELET
Basophils Absolute: 0 10*3/uL (ref 0.0–0.1)
Basophils Relative: 0.6 % (ref 0.0–3.0)
Eosinophils Absolute: 0.1 10*3/uL (ref 0.0–0.7)
Eosinophils Relative: 2 % (ref 0.0–5.0)
HCT: 37.3 % (ref 36.0–46.0)
Hemoglobin: 12.9 g/dL (ref 12.0–15.0)
Lymphocytes Relative: 20.2 % (ref 12.0–46.0)
Lymphs Abs: 1.2 10*3/uL (ref 0.7–4.0)
MCHC: 34.4 g/dL (ref 30.0–36.0)
MCV: 93.3 fl (ref 78.0–100.0)
Monocytes Absolute: 0.7 10*3/uL (ref 0.1–1.0)
Monocytes Relative: 11 % (ref 3.0–12.0)
Neutro Abs: 4.1 10*3/uL (ref 1.4–7.7)
Neutrophils Relative %: 66.2 % (ref 43.0–77.0)
Platelets: 102 10*3/uL — ABNORMAL LOW (ref 150.0–400.0)
RBC: 4 Mil/uL (ref 3.87–5.11)
RDW: 20.5 % — ABNORMAL HIGH (ref 11.5–15.5)
WBC: 6.1 10*3/uL (ref 4.0–10.5)

## 2022-11-15 LAB — PROTIME-INR
INR: 1.2 ratio — ABNORMAL HIGH (ref 0.8–1.0)
Prothrombin Time: 12.6 s (ref 9.6–13.1)

## 2022-11-15 LAB — AMMONIA: Ammonia: 134 umol/L — ABNORMAL HIGH (ref 11–35)

## 2022-11-16 ENCOUNTER — Telehealth: Payer: Self-pay | Admitting: Gastroenterology

## 2022-11-16 ENCOUNTER — Other Ambulatory Visit: Payer: Self-pay | Admitting: Gastroenterology

## 2022-11-16 NOTE — Telephone Encounter (Signed)
Patients wants to discuss lab work. Ammonia levels are high. Please advise

## 2022-11-16 NOTE — Telephone Encounter (Signed)
Dana Dyer the pt is calling regarding her lab results.  She saw that her ammonia level is high and wants to know what she should do. Please advise

## 2022-11-18 ENCOUNTER — Other Ambulatory Visit: Payer: Self-pay | Admitting: Internal Medicine

## 2022-11-30 ENCOUNTER — Other Ambulatory Visit: Payer: Self-pay | Admitting: Gastroenterology

## 2022-11-30 NOTE — Telephone Encounter (Signed)
Ok to refill if this is the request

## 2022-12-15 NOTE — Telephone Encounter (Signed)
PT is returning callto find out about ammonia levels. Please advise

## 2023-01-03 ENCOUNTER — Telehealth: Payer: Self-pay | Admitting: Medical Oncology

## 2023-01-03 NOTE — Telephone Encounter (Signed)
She requests another iron infusion. " I am cold all the time". Schedule message sent for  appts next available-lab Dana Dyer and infusion.Marland Kitchen

## 2023-01-04 ENCOUNTER — Telehealth: Payer: Self-pay | Admitting: Internal Medicine

## 2023-01-04 ENCOUNTER — Other Ambulatory Visit: Payer: Self-pay | Admitting: Physician Assistant

## 2023-01-04 NOTE — Progress Notes (Unsigned)
[facility] OFFICE PROGRESS NOTE  [name], MD [address] [place] [address]  DIAGNOSIS:  1) history of iron deficiency anemia likely secondary to gastrointestinal blood loss. The patient has intolerance to the oral iron tablets. 2) history of ITP.  PRIOR THERAPY: None  CURRENT THERAPY: Venofer 300 Mg IV weekly for 3 weeks on as-needed basis last treatment was given 10/20/2022.   INTERVAL HISTORY: Dana Dyer 72 y.o. female returns to the clinic today for a follow-up visit.  The patient was last seen on 09/28/2022 by Dr. Julien Nordmann.  She is being followed for iron deficiency anemia.  The patient receives as needed IV iron the most recent being on 10/20/2022.  The patient called recently endorsing cold intolerance and was concerned that her anemia may have returned.  She denies any lightheadedness or dizziness.  She denies any chest pain, shortness of breath, or palpitations.  She denies any abnormal bleeding or bruising.  She follows closely with GI for her history of cirrhosis.  She denies any abnormal bleeding or bruising to her knowledge.  She reports she is undergoing routine EGD and colonoscopies.  She does not take an iron supplement secondary to intolerance.  She is here today for evaluation and repeat blood work to see if she needs an iron infusion.   MEDICAL HISTORY: Past Medical History:  Diagnosis Date   ALLERGIC RHINITIS 10/09/2007   Allergy    Arthritis    hips, spine   DIABETES MELLITUS, UNCONTROLLED 03/16/2009   FATIGUE 10/21/2008   FIBROMYALGIA 10/09/2007   GERD (gastroesophageal reflux disease)    diet  controlled, no meds   H/O vaginal hysterectomy    HYPERLIPIDEMIA 10/09/2007   diet controlled, no meds   HYPERTENSION 10/09/2007   Morton's neuroma    OBSTRUCTIVE SLEEP APNEA 10/09/2007   Palpitations    Plantar fasciitis    History - right   Sleep apnea    Uses CPAP   TRANSAMINASES, SERUM, ELEVATED 07/08/2009     ALLERGIES:  is allergic to amoxicillin-pot clavulanate, codeine sulfate, diflucan [fluconazole], epinephrine, erythromycin base, invokana [canagliflozin], irbesartan-hydrochlorothiazide, victoza [liraglutide], latex, and ozempic (0.25 or 0.5 mg-dose) [semaglutide(0.25 or 0.'5mg'$ -dos)].  MEDICATIONS:  Current Outpatient Medications  Medication Sig Dispense Refill   BD PEN NEEDLE NANO 2ND GEN 32G X 4 MM MISC USE AS DIRECTED 400 each 3   clobetasol cream (TEMOVATE) 3.22 % Apply 1 Application topically 2 (two) times daily as needed (yeast infection).     Continuous Blood Gluc Sensor (DEXCOM G7 SENSOR) MISC 1 Device by Does not apply route as directed. Every 10 days 9 each 3   escitalopram (LEXAPRO) 10 MG tablet TAKE 1 TABLET BY MOUTH EVERY DAY 90 tablet 0   furosemide (LASIX) 10 MG/ML solution TAKE 2 MLS (20 MG TOTAL) BY MOUTH DAILY. 120 mL 1   insulin glargine (LANTUS SOLOSTAR) 100 UNIT/ML Solostar Pen INJECT 30 UNITS INTO THE SKIN DAILY. 45 mL 0   insulin lispro (HUMALOG KWIKPEN) 200 UNIT/ML KwikPen USE A MAX OF 120 UNITS AS DIRECTED DAILY 30 mL 0   lactulose (CHRONULAC) 10 GM/15ML solution TAKE 6 TABLESPOONS DAILY 2430 mL 1   metFORMIN (GLUCOPHAGE) 1000 MG tablet TAKE 1 TABLET BY MOUTH TWICE A DAY 180 tablet 3   metoprolol tartrate (LOPRESSOR) 25 MG tablet Take 0.5 tablets (12.5 mg total) by mouth 2 (two) times daily. 90 tablet 3   omeprazole (PRILOSEC) 20 MG capsule Take 20 mg by mouth at bedtime.  rifaximin (XIFAXAN) 550 MG TABS tablet Take 1 tablet (550 mg total) by mouth 2 (two) times daily. 180 tablet 3   sodium chloride 1 g tablet Take 1 tablet (1 g total) by mouth 3 (three) times daily with meals. 30 tablet 0   spironolactone (ALDACTONE) 50 MG tablet Take 1 tablet (50 mg total) by mouth daily. 30 tablet 1   zinc gluconate 50 MG tablet Take 1 tablet (50 mg total) by mouth daily. 30 tablet 3   No current facility-administered medications for this visit.    SURGICAL HISTORY:  Past  Surgical History:  Procedure Laterality Date   ABDOMINAL HYSTERECTOMY  1979   prolapsed uterus   BASAL CELL CARCINOMA EXCISION     BREAST BIOPSY Left 2012   left 2012 stereo   BREAST REDUCTION SURGERY Bilateral 1992   COLONOSCOPY  2006   [place]   mamoplasty     reduction   REDUCTION MAMMAPLASTY Bilateral 1992   TONSILLECTOMY  1959   TUBAL LIGATION  1976    REVIEW OF SYSTEMS:   Review of Systems  Constitutional: Positive for cold intolerance.  Negative for appetite change, chills, fever and unexpected weight change.  HENT: Negative for mouth sores, nosebleeds, sore throat and trouble swallowing.   Eyes: Negative for eye problems and icterus.  Respiratory: Negative for cough, hemoptysis, shortness of breath and wheezing.   Cardiovascular: Negative for chest pain and leg swelling.  Gastrointestinal: Negative for abdominal pain, constipation, diarrhea, nausea and vomiting.  Genitourinary: Negative for bladder incontinence, difficulty urinating, dysuria, frequency and hematuria.   Musculoskeletal: Negative for back pain, gait problem, neck pain and neck stiffness.  Skin: Negative for itching and rash.  Neurological: Negative for dizziness, extremity weakness, gait problem, headaches, light-headedness and seizures.  Hematological: Negative for adenopathy. Does not bruise/bleed easily.  Psychiatric/Behavioral: Negative for confusion, depression and sleep disturbance. The patient is not nervous/anxious.     PHYSICAL EXAMINATION:  Blood pressure 122/63, pulse 83, temperature 98.2 F (36.8 C), temperature source Oral, resp. rate 14, weight 167 lb 14.4 oz (76.2 kg), SpO2 97 %.  ECOG PERFORMANCE STATUS: 1  Physical Exam  Constitutional: Oriented to person, place, and time and well-developed, well-nourished, and in no distress. HENT:  Head: Normocephalic and atraumatic.  Mouth/Throat: Oropharynx is clear and moist. No oropharyngeal exudate.  Eyes: Conjunctivae are normal. Right eye  exhibits no discharge. Left eye exhibits no discharge. No scleral icterus.  Neck: Normal range of motion. Neck supple.  Cardiovascular: Normal rate, regular rhythm, normal heart sounds and intact distal pulses.   Pulmonary/Chest: Effort normal and breath sounds normal. No respiratory distress. No wheezes. No rales.  Abdominal: Soft. Bowel sounds are normal. Exhibits no distension and no mass. There is no tenderness.  Musculoskeletal: Normal range of motion. Exhibits no edema.  Lymphadenopathy:    No cervical adenopathy.  Neurological: Alert and oriented to person, place, and time. Exhibits normal muscle tone. Gait normal. Coordination normal.  Skin: Skin is warm and dry. No rash noted. Not diaphoretic. No erythema. No pallor.  Psychiatric: Mood, memory and judgment normal.  Vitals reviewed.  LABORATORY DATA: Lab Results  Component Value Date   WBC 6.0 01/05/2023   HGB 12.1 01/05/2023   HCT 33.5 (L) 01/05/2023   MCV 94.6 01/05/2023   PLT 104 (L) 01/05/2023      Chemistry      Component Value Date/Time   NA 130 (L) 11/15/2022 1315   K 4.3 11/15/2022 1315   CL 96 11/15/2022 1315  CO2 22 11/15/2022 1315   BUN 19 11/15/2022 1315   CREATININE 0.84 11/15/2022 1315   CREATININE 0.87 06/28/2022 1129      Component Value Date/Time   CALCIUM 9.8 11/15/2022 1315   ALKPHOS 93 11/15/2022 1315   AST 45 (H) 11/15/2022 1315   AST 41 06/28/2022 1129   ALT 54 (H) 11/15/2022 1315   ALT 44 06/28/2022 1129   BILITOT 2.2 (H) 11/15/2022 1315   BILITOT 1.7 (H) 06/28/2022 1129       RADIOGRAPHIC STUDIES:  No results found.   ASSESSMENT/PLAN:  Is a Benjie Karvonen a pleasant 72 year old Caucasian female with a history of iron deficiency anemia secondary to GI blood loss as well as a history of ITP.  The patient has intolerance to oral iron supplements.  The patient receives Venofer 300 mg weekly x 3 as needed the most recent being on 10/20/2022.  She has repeat CBC, CMP, iron studies, and  ferritin drawn today.  Her labs show normocytic anemia with a hemoglobin of 12.1 and MCV at 94.6.  Her platelet count is low but stable at 104 K likely secondary to cirrhosis.  Her iron studies are within normal limits but her ferritin is still pending.  We will wait to determine if she needs any IV iron based on her ferritin.  If she needs IV iron, I will arrange for this at the [facility]. infusion center with Venofer 300 mg weekly x 3 starting next week.  She was given a handout of iron rich food and encouraged to continue to increase her intake of iron rich food in her diet.  Will see her back for follow-up visit in 2-3 months for evaluation repeat CBC, iron studies, ferritin. We will see her in 2 months if she does not need IV iron. If she does require IV iron, we will see her in 3 months.    The patient was advised to call immediately if she has any concerning symptoms in the interval. The patient voices understanding of current disease status and treatment options and is in agreement with the current care plan. All questions were answered. The patient knows to call the clinic with any problems, questions or concerns. We can certainly see the patient much sooner if necessary      Orders Placed This Encounter  Procedures   CBC with Differential ([facility] Only)    Standing Status:   Future    Standing Expiration Date:   01/06/2024   Ferritin    Standing Status:   Future    Standing Expiration Date:   01/06/2024   Iron and Iron Binding Capacity ([facility] only)    Standing Status:   Future    Standing Expiration Date:   01/06/2024     The total time spent in the appointment was 20-29 minutes.   L , PA-C 01/05/23

## 2023-01-04 NOTE — Telephone Encounter (Signed)
Called 2x per 1/30 IB, left one message with pt about appts on 2/1

## 2023-01-05 ENCOUNTER — Inpatient Hospital Stay: Payer: Medicare PPO | Admitting: Physician Assistant

## 2023-01-05 ENCOUNTER — Inpatient Hospital Stay: Payer: Medicare PPO

## 2023-01-05 ENCOUNTER — Inpatient Hospital Stay: Payer: Medicare PPO | Attending: Physician Assistant

## 2023-01-05 ENCOUNTER — Other Ambulatory Visit: Payer: Self-pay

## 2023-01-05 ENCOUNTER — Telehealth: Payer: Self-pay | Admitting: Medical Oncology

## 2023-01-05 VITALS — BP 122/63 | HR 83 | Temp 98.2°F | Resp 14 | Wt 167.9 lb

## 2023-01-05 DIAGNOSIS — D500 Iron deficiency anemia secondary to blood loss (chronic): Secondary | ICD-10-CM

## 2023-01-05 DIAGNOSIS — K7460 Unspecified cirrhosis of liver: Secondary | ICD-10-CM | POA: Diagnosis not present

## 2023-01-05 DIAGNOSIS — I10 Essential (primary) hypertension: Secondary | ICD-10-CM | POA: Diagnosis not present

## 2023-01-05 DIAGNOSIS — Z90710 Acquired absence of both cervix and uterus: Secondary | ICD-10-CM | POA: Diagnosis not present

## 2023-01-05 DIAGNOSIS — D693 Immune thrombocytopenic purpura: Secondary | ICD-10-CM | POA: Diagnosis not present

## 2023-01-05 DIAGNOSIS — E119 Type 2 diabetes mellitus without complications: Secondary | ICD-10-CM | POA: Diagnosis not present

## 2023-01-05 LAB — CBC WITH DIFFERENTIAL (CANCER CENTER ONLY)
Abs Immature Granulocytes: 0.04 10*3/uL (ref 0.00–0.07)
Basophils Absolute: 0.1 10*3/uL (ref 0.0–0.1)
Basophils Relative: 1 %
Eosinophils Absolute: 0.2 10*3/uL (ref 0.0–0.5)
Eosinophils Relative: 3 %
HCT: 33.5 % — ABNORMAL LOW (ref 36.0–46.0)
Hemoglobin: 12.1 g/dL (ref 12.0–15.0)
Immature Granulocytes: 1 %
Lymphocytes Relative: 19 %
Lymphs Abs: 1.2 10*3/uL (ref 0.7–4.0)
MCH: 34.2 pg — ABNORMAL HIGH (ref 26.0–34.0)
MCHC: 36.1 g/dL — ABNORMAL HIGH (ref 30.0–36.0)
MCV: 94.6 fL (ref 80.0–100.0)
Monocytes Absolute: 0.6 10*3/uL (ref 0.1–1.0)
Monocytes Relative: 11 %
Neutro Abs: 4 10*3/uL (ref 1.7–7.7)
Neutrophils Relative %: 65 %
Platelet Count: 104 10*3/uL — ABNORMAL LOW (ref 150–400)
RBC: 3.54 MIL/uL — ABNORMAL LOW (ref 3.87–5.11)
RDW: 14.4 % (ref 11.5–15.5)
WBC Count: 6 10*3/uL (ref 4.0–10.5)
nRBC: 0 % (ref 0.0–0.2)

## 2023-01-05 LAB — IRON AND IRON BINDING CAPACITY (CC-WL,HP ONLY)
Iron: 68 ug/dL (ref 28–170)
Saturation Ratios: 17 % (ref 10.4–31.8)
TIBC: 398 ug/dL (ref 250–450)
UIBC: 330 ug/dL (ref 148–442)

## 2023-01-05 LAB — FERRITIN: Ferritin: 46 ng/mL (ref 11–307)

## 2023-01-05 NOTE — Telephone Encounter (Signed)
No issue

## 2023-01-06 ENCOUNTER — Telehealth: Payer: Self-pay | Admitting: Physician Assistant

## 2023-01-06 NOTE — Telephone Encounter (Signed)
I called the patient to review her iron studies. Unable to reach her. I left her a voicemail. She does not need an IV iron infusion at this time. She has some intolerance to ferrous sulfate p.o. I would recommend trying a low dose iron or prenatal iron which may be more tolerable. We will see her back for a follow up in 2-3 months for repeat labs and follow up visit.

## 2023-01-13 ENCOUNTER — Ambulatory Visit: Payer: Medicare PPO | Admitting: Internal Medicine

## 2023-01-13 ENCOUNTER — Encounter: Payer: Self-pay | Admitting: Internal Medicine

## 2023-01-13 VITALS — BP 130/82 | HR 83 | Wt 170.0 lb

## 2023-01-13 DIAGNOSIS — Z794 Long term (current) use of insulin: Secondary | ICD-10-CM

## 2023-01-13 DIAGNOSIS — E1142 Type 2 diabetes mellitus with diabetic polyneuropathy: Secondary | ICD-10-CM | POA: Diagnosis not present

## 2023-01-13 LAB — POCT GLYCOSYLATED HEMOGLOBIN (HGB A1C): Hemoglobin A1C: 6 % — AB (ref 4.0–5.6)

## 2023-01-13 MED ORDER — METFORMIN HCL ER 750 MG PO TB24: 750.0000 mg | ORAL_TABLET | Freq: Two times a day (BID) | ORAL | 3 refills | Status: DC

## 2023-01-13 NOTE — Patient Instructions (Addendum)
-   Decrease Lantus 28 units daily  - Change  Humalog 14 units with Breakfast,  16 units Lunch and 16 units supper - Change Metformin 750 mg, 1 tablet twice daily  - Humalog correctional insulin: ADD extra units on insulin to your meal-time Humalog  dose if your blood sugars are higher than 155. Use the scale below to help guide you:   Blood sugar before meal Number of units to inject  Less than 155 0 unit  156 - 180 1 units  181 - 205 2 units  206 - 230 3 units  231 - 255 4 units  256 - 280 5 units  281 - 305 6 units  306 - 330 7 units  331 - 355 8 units  356 - 380 9 units   381 - 405 10 units      HOW TO TREAT LOW BLOOD SUGARS (Blood sugar LESS THAN 70 MG/DL) Please follow the RULE OF 15 for the treatment of hypoglycemia treatment (when your (blood sugars are less than 70 mg/dL)   STEP 1: Take 15 grams of carbohydrates when your blood sugar is low, which includes:  3-4 GLUCOSE TABS  OR 3-4 OZ OF JUICE OR REGULAR SODA OR ONE TUBE OF GLUCOSE GEL    STEP 2: RECHECK blood sugar in 15 MINUTES STEP 3: If your blood sugar is still low at the 15 minute recheck --> then, go back to STEP 1 and treat AGAIN with another 15 grams of carbohydrates

## 2023-01-13 NOTE — Progress Notes (Signed)
Name: Dana Dyer  Age/ Sex: 72 y.o., female   MRN/ DOB: [id number], [birth year]     PCP: Eulas Post, MD   Reason for Endocrinology Evaluation: Type 2 Diabetes Mellitus  Initial Endocrine Consultative Visit: 10/23/18    PATIENT IDENTIFIER: [name] is a 72 y.o. female with a past medical history of HTN, T2DM, Cirrhosis (Dx 2019), Fibromyalgia and dyslipidemia . The patient has followed with Endocrinology clinic since 10/23/18 for consultative assistance with management of her diabetes.  DIABETIC HISTORY:  Dana Dyer was diagnosed with T2DM in 2005. She used to be on SU but does not recall intolerance. She is intolerant to victoza - nauea and invokana- yeast infection. She was started on insulin therapy in 2009. Her hemoglobin A1c has ranged from 7.8 % in 2012, peaking at 10.9% in 2018.   Ozempic was stopped in 06/2019 due to persistent nausea.   Switched pravastatin to crestor 09/2019  SUBJECTIVE:   During the last visit (06/24/2022): A1c was 6.9 %    Today (01/13/2023): Dana Dyer is here for a follow up on diabetes management.  She checks her blood sugars multiple times a day through CGM.  The patient has not had hypoglycemic episodes since the last clinic visit per  She was seen by oncology for iron deficiency anemia secondary to GI bleed and ITP 01/05/2023 Continues to follow up with Gi for cirrhosis 10/20/2022 She presented to the ED for hyponatremia 10/15/2022, sodium 127, glucose 216 mg/DL  She was seen by cardiology for dizziness and near syncope 2023  She has pruritus , uses a topical which alleviates symptoms for 4-5 hours   Denies nausea or vomiting  She continues to be on lactulose which causes diarrhea , at times she has defecation without warning She is having ecchymotic lesions on the upper extremities     HOME DIABETES REGIMEN:  Lantus 30 units daily Humalog 12 units with Breakfast, 16 units with lunch and 18 units with  supper Metformin 1000 mg BID  CF: Humalog (BG-130/25)    CONTINUOUS GLUCOSE MONITORING RECORD INTERPRETATION    Dates of Recording:1/27-01/13/2023  Sensor description:dexcom  Results statistics:   CGM use % of time 93  Average and SD 165/51  Time in range 67%  % Time Above 180 26  % Time above 250 6  % Time Below target <1      Glycemic patterns summary: Bg's optimal overnight, with hyperglycemia noted during the day   Hyperglycemic episodes  postprandial   Hypoglycemic episodes occurred  n/a  Overnight periods: optimal    DIABETIC COMPLICATIONS: Microvascular complications:  Neuroathy Denies: retinopathy, nephropathy Last eye exam: Completed 09/06/2021     Macrovascular complications:   Denies: CAD, PVD, CVA    HISTORY:  Past Medical History:  Past Medical History:  Diagnosis Date   ALLERGIC RHINITIS 10/09/2007   Allergy    Arthritis    hips, spine   DIABETES MELLITUS, UNCONTROLLED 03/16/2009   FATIGUE 10/21/2008   FIBROMYALGIA 10/09/2007   GERD (gastroesophageal reflux disease)    diet  controlled, no meds   H/O vaginal hysterectomy    HYPERLIPIDEMIA 10/09/2007   diet controlled, no meds   HYPERTENSION 10/09/2007   Morton's neuroma    OBSTRUCTIVE SLEEP APNEA 10/09/2007   Palpitations    Plantar fasciitis    History - right   Sleep apnea    Uses CPAP   TRANSAMINASES, SERUM, ELEVATED 07/08/2009   Past Surgical History:  Past Surgical History:  Procedure Laterality Date   ABDOMINAL HYSTERECTOMY  1979   prolapsed uterus   BASAL CELL CARCINOMA EXCISION     BREAST BIOPSY Left 2012   left 2012 stereo   BREAST REDUCTION SURGERY Bilateral 1992   COLONOSCOPY  2006   [place]   mamoplasty     reduction   REDUCTION MAMMAPLASTY Bilateral [place]   TUBAL LIGATION  1976   Social History:  reports that she has never smoked. She has never used smokeless tobacco. She reports that she does not drink alcohol and does not use  drugs. Family History:  Family History  Problem Relation Age of Onset   Diabetes Mother    Arthritis Mother    Cancer Mother        breast   Breast cancer Mother 85   Alcohol abuse Father    Arthritis Father    Hypertension Maternal Grandfather    Miscarriages / Stillbirths Paternal Grandfather    Breast cancer Maternal Aunt        diagnosed in her 50's   Colon polyps Neg Hx    Colon cancer Neg Hx    Esophageal cancer Neg Hx    Rectal cancer Neg Hx    Stomach cancer Neg Hx      HOME MEDICATIONS: Allergies as of 01/13/2023       Reactions   Amoxicillin-pot Clavulanate    Severe diarrhea   Codeine Sulfate    REACTION: "hyper"   Diflucan [fluconazole] Hives   Epinephrine Other (See Comments)   Racing heart rate   Erythromycin Base Diarrhea, Other (See Comments)   cramps   Invokana [canagliflozin] Other (See Comments)   Recurrent yeast infections   Irbesartan-hydrochlorothiazide Other (See Comments)   Unknown reaction   Victoza [liraglutide] Nausea Only   Latex Rash   Long term use   Ozempic (0.25 Or 0.5 Mg-dose) [semaglutide(0.25 Or 0.40m-dos)] Nausea Only        Medication List        Accurate as of January 13, 2023  9:11 AM. If you have any questions, ask your nurse or doctor.          BD Pen Needle Nano 2nd Gen 32G X 4 MM Misc Generic drug: Insulin Pen Needle USE AS DIRECTED   clobetasol cream 0.05 % Commonly known as: TEMOVATE Apply 1 Application topically 2 (two) times daily as needed (yeast infection).   Dexcom G7 Sensor Misc 1 Device by Does not apply route as directed. Every 10 days   escitalopram 10 MG tablet Commonly known as: LEXAPRO TAKE 1 TABLET BY MOUTH EVERY DAY   furosemide 10 MG/ML solution Commonly known as: LASIX TAKE 2 MLS (20 MG TOTAL) BY MOUTH DAILY.   HumaLOG KwikPen 200 UNIT/ML KwikPen Generic drug: insulin lispro USE A MAX OF 120 UNITS AS DIRECTED DAILY   lactulose 10 GM/15ML solution Commonly known as:  CHRONULAC TAKE 6 TABLESPOONS DAILY   Lantus SoloStar 100 UNIT/ML Solostar Pen Generic drug: insulin glargine INJECT 30 UNITS INTO THE SKIN DAILY.   metFORMIN 1000 MG tablet Commonly known as: GLUCOPHAGE TAKE 1 TABLET BY MOUTH TWICE A DAY   metoprolol tartrate 25 MG tablet Commonly known as: LOPRESSOR Take 0.5 tablets (12.5 mg total) by mouth 2 (two) times daily.   omeprazole 20 MG capsule Commonly known as: PRILOSEC Take 20 mg by mouth at bedtime.   rifaximin 550 MG Tabs tablet Commonly known as: XIFAXAN Take 1 tablet (550 mg total) by mouth  2 (two) times daily.   sodium chloride 1 g tablet Take 1 tablet (1 g total) by mouth 3 (three) times daily with meals.   spironolactone 50 MG tablet Commonly known as: Aldactone Take 1 tablet (50 mg total) by mouth daily.   zinc gluconate 50 MG tablet Take 1 tablet (50 mg total) by mouth daily.         OBJECTIVE:   Vital Signs: BP 130/82 (BP Location: Left Arm, Patient Position: Sitting, Cuff Size: Small)   Pulse 83   Wt 170 lb (77.1 kg)   SpO2 96%   BMI 30.11 kg/m   Wt Readings from Last 3 Encounters:  01/13/23 170 lb (77.1 kg)  01/05/23 167 lb 14.4 oz (76.2 kg)  10/20/22 165 lb 2 oz (74.9 kg)     Exam: General: Pt appears well and is in NAD  Lungs: Clear with good BS bilat   Heart: RRR   Extremities: No  pretibial edema.  Neuro: MS is good with appropriate affect, pt is alert and Ox3   DM Foot 01/13/2023 The skin of the feet is intact without sores or ulcerations. The pedal pulses are 2+ on right and 2+ on left. The sensation is intact   to a screening 5.07, 10 gram monofilament on the left      DATA REVIEWED:  Lab Results  Component Value Date   HGBA1C 6.0 (A) 01/13/2023   HGBA1C 6.8 (A) 06/24/2022   HGBA1C 6.7 (A) 03/17/2022    Latest Reference Range & Units 11/15/22 13:15  COMPREHENSIVE METABOLIC PANEL  Rpt !  Sodium 135 - 145 mEq/L 130 (L)  Potassium 3.5 - 5.1 mEq/L 4.3  Chloride 96 - 112 mEq/L  96  CO2 19 - 32 mEq/L 22  Glucose 70 - 99 mg/dL 182 (H)  BUN 6 - 23 mg/dL 19  Creatinine 0.40 - 1.20 mg/dL 0.84  Calcium 8.4 - 10.5 mg/dL 9.8  Alkaline Phosphatase 39 - 117 U/L 93  Albumin 3.5 - 5.2 g/dL 3.6  AST 0 - 37 U/L 45 (H)  ALT 0 - 35 U/L 54 (H)  Total Protein 6.0 - 8.3 g/dL 6.8  Ammonia 11 - 35 umol/L 134 (H)  Total Bilirubin 0.2 - 1.2 mg/dL 2.2 (H)  GFR >60.00 mL/min 69.03    ASSESSMENT / PLAN / RECOMMENDATIONS:   1) Type 2 Diabetes Mellitus, OPtimally controlled, With neuropathic complications - Most recent A1c of 6.0 %. Goal A1c < 7.5 %.   -A1c remains at goal -She has been noted with hypoglycemia overnight and after supper, we will reduce basal insulin as well as prandial insulin before supper.  Patient has been noted with hyperglycemia after breakfast, will increase prandial insulin as below - She is intolerant to GLP-1 agonists due to GI side effects and SGLT-2 inhibitors due to recurrent yeast infection  -I have recommended switching metformin to XR formulation, to see if this would help bowel movement, with the understanding that with lactulose and she is expected to have diarrhea but possibly switching metformin to XR will make it less severe   MEDICATIONS: -Decrease Lantus 28 units daily -Change Humalog 14 units with breakfast, 16 units with Lunch and 16 units with supper  -Change  metformin 750 XR mg BID  - CF : HUmalog ( BG -130/25)    EDUCATION / INSTRUCTIONS: BG monitoring instructions: Patient is instructed to check her blood sugars 4 times a day, before meals and bedtime. Call [facility] if: BG persistently < 70  I reviewed the Rule of 15 for the treatment of hypoglycemia in detail with the patient. Literature supplied.  2. ) Ecchymosis:  - We did discuss this is due to low platelet count     F/U in 6 months  Signed electronically by: Mack Guise, MD  [facility]  [address] [place], [place] [address] Phone: [phone] FAX: [phone]   CC: [address], [place] [address] Phone: [phone]  Fax: [phone]  Return to Endocrinology clinic as below: Future Appointments  Date Time Provider [place]  01/18/2023  1:20 PM Lavena Bullion, DO [facility]  04/06/2023  3:15 PM [facility] None  04/06/2023  3:45 PM Curt Bears, MD [facility] None  08/15/2023  1:00 PM [place] [facility]

## 2023-01-16 ENCOUNTER — Encounter: Payer: Self-pay | Admitting: Internal Medicine

## 2023-01-18 ENCOUNTER — Encounter: Payer: Self-pay | Admitting: Gastroenterology

## 2023-01-18 ENCOUNTER — Other Ambulatory Visit (INDEPENDENT_AMBULATORY_CARE_PROVIDER_SITE_OTHER): Payer: Medicare PPO

## 2023-01-18 ENCOUNTER — Other Ambulatory Visit: Payer: Self-pay | Admitting: Family Medicine

## 2023-01-18 ENCOUNTER — Ambulatory Visit: Payer: Medicare PPO | Admitting: Gastroenterology

## 2023-01-18 VITALS — BP 122/76 | HR 80 | Ht 63.0 in | Wt 170.0 lb

## 2023-01-18 DIAGNOSIS — K7581 Nonalcoholic steatohepatitis (NASH): Secondary | ICD-10-CM | POA: Diagnosis not present

## 2023-01-18 DIAGNOSIS — Z8719 Personal history of other diseases of the digestive system: Secondary | ICD-10-CM | POA: Diagnosis not present

## 2023-01-18 DIAGNOSIS — E1165 Type 2 diabetes mellitus with hyperglycemia: Secondary | ICD-10-CM | POA: Diagnosis not present

## 2023-01-18 DIAGNOSIS — F339 Major depressive disorder, recurrent, unspecified: Secondary | ICD-10-CM

## 2023-01-18 DIAGNOSIS — K7682 Hepatic encephalopathy: Secondary | ICD-10-CM

## 2023-01-18 DIAGNOSIS — Z86010 Personal history of colonic polyps: Secondary | ICD-10-CM | POA: Diagnosis not present

## 2023-01-18 DIAGNOSIS — K7469 Other cirrhosis of liver: Secondary | ICD-10-CM | POA: Diagnosis not present

## 2023-01-18 DIAGNOSIS — K7460 Unspecified cirrhosis of liver: Secondary | ICD-10-CM | POA: Diagnosis not present

## 2023-01-18 DIAGNOSIS — K5909 Other constipation: Secondary | ICD-10-CM | POA: Diagnosis not present

## 2023-01-18 LAB — COMPREHENSIVE METABOLIC PANEL
ALT: 52 U/L — ABNORMAL HIGH (ref 0–35)
AST: 45 U/L — ABNORMAL HIGH (ref 0–37)
Albumin: 3.4 g/dL — ABNORMAL LOW (ref 3.5–5.2)
Alkaline Phosphatase: 86 U/L (ref 39–117)
BUN: 17 mg/dL (ref 6–23)
CO2: 29 mEq/L (ref 19–32)
Calcium: 10 mg/dL (ref 8.4–10.5)
Chloride: 95 mEq/L — ABNORMAL LOW (ref 96–112)
Creatinine, Ser: 0.84 mg/dL (ref 0.40–1.20)
GFR: 68.94 mL/min (ref >60.00)
Glucose, Bld: 148 mg/dL — ABNORMAL HIGH (ref 70–99)
Potassium: 4.1 mEq/L (ref 3.5–5.1)
Sodium: 129 mEq/L — ABNORMAL LOW (ref 135–145)
Total Bilirubin: 2.5 mg/dL — ABNORMAL HIGH (ref 0.2–1.2)
Total Protein: 6.8 g/dL (ref 6.0–8.3)

## 2023-01-18 LAB — AMMONIA: Ammonia: 39 umol/L — ABNORMAL HIGH (ref 11–35)

## 2023-01-18 MED ORDER — RIFAXIMIN 550 MG PO TABS: 550.0000 mg | ORAL_TABLET | Freq: Two times a day (BID) | ORAL | 3 refills | Status: DC

## 2023-01-18 NOTE — Patient Instructions (Addendum)
Your provider has requested that you go to the basement level for lab work before leaving today. Press "B" on the elevator. The lab is located at the first door on the left as you exit the elevator.  We have sent the following medications to your pharmacy for you to pick up at your convenience:   Rifaximin 550 MG two times daily.  Take Zinc 50 MG daily.  Please follow up in 6 months. Give [place] a call at [phone] to schedule an appointment.    _______________________________________________________  If your blood pressure at your visit was 140/90 or greater, please contact your primary care physician to follow up on this.  _______________________________________________________  If you are age 74 or older, your body mass index should be between 23-30. Your Body mass index is 30.11 kg/m. If this is out of the aforementioned range listed, please consider follow up with your Primary Care Provider.   __________________________________________________________  The [facility] providers would like to encourage you to use [facility] to communicate with providers for non-urgent requests or questions.  Due to long hold times on the telephone, sending your provider a message by [facility] may be a faster and more efficient way to get a response.  Please allow 48 business hours for a response.  Please remember that this is for non-urgent requests.   Due to recent changes in healthcare laws, you may see the results of your imaging and laboratory studies on MyChart before your provider has had a chance to review them.  We understand that in some cases there may be results that are confusing or concerning to you. Not all laboratory results come back in the same time frame and the provider may be waiting for multiple results in order to interpret others.  Please give [place] 48 hours in order for your provider to thoroughly review all the results before contacting the office for clarification of your results.      Thank you for choosing me and [facility].  Vito Cirigliano, D.O.

## 2023-01-18 NOTE — Progress Notes (Unsigned)
Chief Complaint:    Cirrhosis  GI History: 1.  History of adenomatous polyps in her colon.  Colonoscopy 2006 Dr. Timmothy Euler found no polyps.  Colonoscopy Dr. Ardis Hughs December 2016 found 7 subcentimeter polyps.  These were all adenomatous polyps on pathology.  Also documented left-sided diverticulosis.  Dr. Ardis Hughs recommended she have a repeat colonoscopy at 3-year interval.   Colonoscopy January 2020 four polyps were removed from her colon, all were adenomatous on pathology.  She was recommended to have repeat colonoscopy at 3-year interval.  Colonoscopy 03/2022 2 subcentimeter adenomas.  A 1 cm ulcer in the cecum was noted this did not appear malignant.  Biopsies showed no neoplasm, suspect from meloxicam she was taken for back pains.  Previous tattoo of colon area was normal. Recommended 5 year repeat.    2. Cirrhosis, likely from longstanding fatty liver disease, out-of-control diabetes.  Diagnosed 2019. - Lab workup 2019: Hep C Ab negative, Hep B Surface Ag negative, hepatitis B surface antibody negative, hep B Core Igm negative, Hep A IgM negative. Ferritin normal, HIV negative, ANA negative, antimitochondrial antibody negative, hepatitis a antibody total negative; slightly low platelets, transaminases slightly elevated.  No edema. - [place] 07/2018: slightly nodular, suspicious for cirrhosis. - [place] 12/2019 cirrhosis without mass lesions. - Ultrasound 08/2020 showed cirrhosis without focal mass lesions. Gallbladder thick but without Murphy sign. - Ultrasound May 2022 showed cirrhosis, trace perihepatic ascites, no focal lesions in the liver. - [place] 12/2021 nodular liver, no masses.  - EGD 12/2018 small distal esophagus varices, mild portal gastropathy, no gastric varices.  She was started on nadolol (eventually at 37m daily). - EGD 03/2022 small esophagus varices, mild portal gastropathy.  Biopsies of minimal gastritis showed no H. pylori - AFP 11/2021 normal - MELD 12 (11/2021 labs) - Hepatitis A, B  vaccination series started 11/2018 -11/2021: Mild encephalopathy, memory disturbance, slight tremors, ammonia 65. Started lactulose which might have raised her blood sugars a bit. -07/29/2022 - 08/01/2022: Hospital admission for hyponatremia 2/2 hypovolemia.  NA 120 on admission, improved to 130s by DC.  Discharged home with salt tablets to hold diuretics (was actually diuretics prior to hospitalization) -07/29/2022: CT A/P: Cirrhotic appearing liver, no HCC.  Evidence of portal hypertension (varices, splenorenal shunt, small volume ascites, mild splenomegaly), multiple cystic lesions in the uncinate process of the pancreas measuring up to 4.1 cm favoring sidebranch IPMN's.  Patient was not found to be a candidate for EUS based on hyponatremia and recommended MRI repeat in 3 months - 08/04/22: GI follow-up with JEllouise Newer  Was still taking salt tablets.  Not taking any diuretics. - 08/09/2022: NA 136, ammonia 57 - 08/15/2022: Patient called to states she is accumulating fluid. - 08/15/2022: Abdominal ultrasound: New small to moderate ascites, GB wall thickening related to ascites/hepatic dysfunction.  Cirrhosis without HCC.  Restart Aldactone 25 mg qod and Lasix 10 mg qod (to take on alternating days).  Placed referral to Nephrology for medication intolerant ascites - 08/23/2022: NA 131   Cirrhosis Evaluation: - Etiology: NASH - Complications: Hepatic encephalopathy, hyponatremia, ascites, diuretic intolerance, esophageal varices, portal hypertensive gastropathy - HCC screening: UTD - Variceal screening: EGD 03/2022: Small esophageal varices - Serologic evaluation: Negative/normal as above - Viral hepatitis vaccination: UTD - Flu vaccine: UTD - Liver biopsy: None - Medications: Lactulose, metoprolol, Lasix 10 mg/day, Aldactone 25 mg/day - MELD 3.0: 20 - Child Pugh score: B    HPI:     Patient is a 72y.o. female presenting to the Gastroenterology  Clinic for follow-up.  Last seen by me in the  office on 10/05/2022.  Was doing well with Lasix 20 mg/day and Aldactone 50 mg/day.  Main issue at that time alternating constipation/loose stools has actually had constipation "forever".  Recommended increasing lactulose with goal of 2-4 soft stools daily, with consideration for MiraLAX depending on clinical response.  Continues to follow in the Hematology clinic for IDA.  Was last seen 01/05/2023.  Continues to follow with Endocrinology, last seen 01/13/2023, with A1c at goal.   Has had a hard time titrating her lactulose effectively, but has made some adjustments with improvement in stool consistency. Petr her d/w with her Endo, is also planning to decrase Metformin, and looking for improvement in stools with that modification.   Separately, has spots on arms. *** liver spots. Follows regularly with Derm, and scheduled to see in 6 months and will bring.  Will also have intermittent itching since summer. Uses OTC cream which helps (lidocaine sunburn cream).    Review of systems:     No chest pain, no SOB, no fevers, no urinary sx   Past Medical History:  Diagnosis Date   ALLERGIC RHINITIS 10/09/2007   Allergy    Arthritis    hips, spine   DIABETES MELLITUS, UNCONTROLLED 03/16/2009   FATIGUE 10/21/2008   FIBROMYALGIA 10/09/2007   GERD (gastroesophageal reflux disease)    diet  controlled, no meds   H/O vaginal hysterectomy    HYPERLIPIDEMIA 10/09/2007   diet controlled, no meds   HYPERTENSION 10/09/2007   Morton's neuroma    OBSTRUCTIVE SLEEP APNEA 10/09/2007   Palpitations    Plantar fasciitis    History - right   Sleep apnea    Uses CPAP   TRANSAMINASES, SERUM, ELEVATED 07/08/2009    Patient's surgical history, family medical history, social history, medications and allergies were all reviewed in Epic    Current Outpatient Medications  Medication Sig Dispense Refill   BD PEN NEEDLE NANO 2ND GEN 32G X 4 MM MISC USE AS DIRECTED 400 each 3   clobetasol cream (TEMOVATE) [id number] %  Apply 1 Application topically 2 (two) times daily as needed (yeast infection).     Continuous Blood Gluc Sensor (DEXCOM G7 SENSOR) MISC 1 Device by Does not apply route as directed. Every 10 days 9 each 3   escitalopram (LEXAPRO) 10 MG tablet TAKE 1 TABLET BY MOUTH EVERY DAY 90 tablet 0   furosemide (LASIX) 10 MG/ML solution TAKE 2 MLS (20 MG TOTAL) BY MOUTH DAILY. 120 mL 1   insulin glargine (LANTUS SOLOSTAR) 100 UNIT/ML Solostar Pen INJECT 30 UNITS INTO THE SKIN DAILY. 45 mL 0   insulin lispro (HUMALOG KWIKPEN) 200 UNIT/ML KwikPen USE A MAX OF 120 UNITS AS DIRECTED DAILY 30 mL 0   lactulose (CHRONULAC) 10 GM/15ML solution TAKE 6 TABLESPOONS DAILY 2430 mL 1   metFORMIN (GLUCOPHAGE-XR) 750 MG 24 hr tablet Take 1 tablet (750 mg total) by mouth 2 (two) times daily. 180 tablet 3   metoprolol tartrate (LOPRESSOR) 25 MG tablet Take 0.5 tablets (12.5 mg total) by mouth 2 (two) times daily. 90 tablet 3   omeprazole (PRILOSEC) 20 MG capsule Take 20 mg by mouth at bedtime.     rifaximin (XIFAXAN) 550 MG TABS tablet Take 1 tablet (550 mg total) by mouth 2 (two) times daily. 180 tablet 3   sodium chloride 1 g tablet Take 1 tablet (1 g total) by mouth 3 (three) times daily with meals. 30 tablet 0  spironolactone (ALDACTONE) 50 MG tablet Take 1 tablet (50 mg total) by mouth daily. 30 tablet 1   zinc gluconate 50 MG tablet Take 1 tablet (50 mg total) by mouth daily. 30 tablet 3   No current facility-administered medications for this visit.    Physical Exam:     BP 122/76   Pulse 80   Ht 5' 3"$  (1.6 m)   Wt 170 lb (77.1 kg)   BMI 30.11 kg/m   GENERAL:  Pleasant *** in NAD PSYCH: : Cooperative, normal affect EENT:  conjunctiva pink, mucous membranes moist, neck supple without masses CARDIAC:  RRR, ***murmur heard, no peripheral edema PULM: Normal respiratory effort, lungs CTA bilaterally, no wheezing ABDOMEN:  Nondistended, soft, nontender. No obvious masses, no hepatomegaly,  normal bowel  sounds SKIN:  turgor, no lesions seen Musculoskeletal:  Normal muscle tone, normal strength NEURO: Alert and oriented x 3, no focal neurologic deficits   IMPRESSION and PLAN:    1)   Atrarax qhs. Will use if still itching despite med changes below Benadryl prn ok Recheck Ammonia and CMP Zinc 50 mg daily refilled today States she is not taking Rifaxmin. Will restart today at 550 BID Resume Lactulose  RTC in 6 months           Lavena Bullion ,DO, FACG 01/18/2023, 2:12 PM

## 2023-01-24 ENCOUNTER — Other Ambulatory Visit: Payer: Self-pay | Admitting: Internal Medicine

## 2023-01-25 ENCOUNTER — Encounter: Payer: Self-pay | Admitting: Gastroenterology

## 2023-01-25 DIAGNOSIS — K7469 Other cirrhosis of liver: Secondary | ICD-10-CM

## 2023-01-25 DIAGNOSIS — K7682 Hepatic encephalopathy: Secondary | ICD-10-CM

## 2023-01-25 DIAGNOSIS — K7460 Unspecified cirrhosis of liver: Secondary | ICD-10-CM

## 2023-01-26 NOTE — Telephone Encounter (Signed)
Any lower extremity edema?  Given overall concern for fluid retention, we can try adjusting diuretics, but no we have had issues with hyponatremia with higher doses in the past.  Plan to go up very gently with the following regimen: - Continue Aldactone as is - Increase Lasix to 40 mg every other day, and keep 20 mg on the alternating days.  If this is well-tolerated and depending on repeat labs, could potentially go up to 40 mg daily if needed, followed by adjustment in Aldactone - Repeat BMP check in 7-10 days after medication adjustment  For the hemorrhoids, can schedule OV for hemorrhoid banding with me.

## 2023-01-26 NOTE — Telephone Encounter (Signed)
Recommendations sent to patient via MyChart. Lab order and reminder in epic.

## 2023-01-27 ENCOUNTER — Ambulatory Visit: Payer: Medicare PPO | Admitting: Gastroenterology

## 2023-01-31 ENCOUNTER — Other Ambulatory Visit: Payer: Self-pay | Admitting: Family Medicine

## 2023-02-02 ENCOUNTER — Telehealth: Payer: Self-pay | Admitting: Gastroenterology

## 2023-02-02 NOTE — Telephone Encounter (Signed)
Inbound call from patient requesting to speak with a nurse. Patient was told  the she may have go to the lab and she is not sure if that's accurate wants clarification on whether go or not. Please advise

## 2023-02-03 ENCOUNTER — Telehealth: Payer: Self-pay | Admitting: *Deleted

## 2023-02-03 ENCOUNTER — Other Ambulatory Visit (INDEPENDENT_AMBULATORY_CARE_PROVIDER_SITE_OTHER): Payer: Medicare PPO

## 2023-02-03 DIAGNOSIS — E785 Hyperlipidemia, unspecified: Secondary | ICD-10-CM

## 2023-02-03 DIAGNOSIS — I10 Essential (primary) hypertension: Secondary | ICD-10-CM

## 2023-02-03 DIAGNOSIS — K7460 Unspecified cirrhosis of liver: Secondary | ICD-10-CM

## 2023-02-03 DIAGNOSIS — K7581 Nonalcoholic steatohepatitis (NASH): Secondary | ICD-10-CM

## 2023-02-03 DIAGNOSIS — K7682 Hepatic encephalopathy: Secondary | ICD-10-CM | POA: Diagnosis not present

## 2023-02-03 DIAGNOSIS — K7469 Other cirrhosis of liver: Secondary | ICD-10-CM | POA: Diagnosis not present

## 2023-02-03 DIAGNOSIS — F322 Major depressive disorder, single episode, severe without psychotic features: Secondary | ICD-10-CM

## 2023-02-03 DIAGNOSIS — Z794 Long term (current) use of insulin: Secondary | ICD-10-CM

## 2023-02-03 LAB — BASIC METABOLIC PANEL
BUN: 16 mg/dL (ref 6–23)
CO2: 29 mEq/L (ref 19–32)
Calcium: 9.7 mg/dL (ref 8.4–10.5)
Chloride: 97 mEq/L (ref 96–112)
Creatinine, Ser: 0.85 mg/dL (ref 0.40–1.20)
GFR: 67.95 mL/min (ref >60.00)
Glucose, Bld: 196 mg/dL — ABNORMAL HIGH (ref 70–99)
Potassium: 4.3 mEq/L (ref 3.5–5.1)
Sodium: 133 mEq/L — ABNORMAL LOW (ref 135–145)

## 2023-02-03 NOTE — Telephone Encounter (Signed)
Pharmacy referral entered as requested.

## 2023-02-03 NOTE — Telephone Encounter (Signed)
Returned call to patient. I confirmed that patient is due for labs today. Pt is aware that no appt is necessary and she can stop by the lab anytime before 5 pm today. Pt verbalized understanding and had no concerns at the end of the call.

## 2023-02-06 ENCOUNTER — Other Ambulatory Visit: Payer: Self-pay

## 2023-02-06 MED ORDER — FUROSEMIDE 10 MG/ML PO SOLN: ORAL | 1 refills | Status: DC

## 2023-02-07 DIAGNOSIS — L239 Allergic contact dermatitis, unspecified cause: Secondary | ICD-10-CM | POA: Diagnosis not present

## 2023-02-07 DIAGNOSIS — L858 Other specified epidermal thickening: Secondary | ICD-10-CM | POA: Diagnosis not present

## 2023-02-07 DIAGNOSIS — L930 Discoid lupus erythematosus: Secondary | ICD-10-CM | POA: Diagnosis not present

## 2023-02-07 DIAGNOSIS — Z85828 Personal history of other malignant neoplasm of skin: Secondary | ICD-10-CM | POA: Diagnosis not present

## 2023-02-07 DIAGNOSIS — D692 Other nonthrombocytopenic purpura: Secondary | ICD-10-CM | POA: Diagnosis not present

## 2023-02-07 DIAGNOSIS — L309 Dermatitis, unspecified: Secondary | ICD-10-CM | POA: Diagnosis not present

## 2023-02-09 ENCOUNTER — Telehealth: Payer: Self-pay

## 2023-02-09 NOTE — Progress Notes (Signed)
Patient ID: Dana Dyer, female   DOB: February 13, 1951, 72 y.o.   MRN: [id number]  Care Management & Coordination Services Pharmacy Team  Reason for Encounter: Chart prep for initial encounter with Alda Ponder D on 02/13/23 @ 1 pm in office.   Spoke with patient on 02/10/2023   Have you seen any other providers since your last visit? Patient reports none  Any changes in your medications or health? Patient reports none  Any side effects from any medications? Patient reports she is taking lactulose for her kidneys and she has IBS and sometimes it is a lot with her stomach hurting her and not being able to control her bowels.  Do you have an symptoms or problems not managed by your medications? Patient reports none  Any concerns about your health right now? Patient reports not presently she is actually really happy with her recent blood work results.  Has your provider asked that you check blood pressure, blood sugar, or follow special diet at home? Patient reports she is using the Dexcom for her sugars and has a blood pressure cuff. She states in the past her pressure was running rather low around 80/50 but is much better managed now. She will bring her cuff with her to the appointment for accuracy check.  Do you get any type of exercise on a regular basis? Patient reports she is still able to get around well not using any assisted devices for mobility and still does her housework/shopping.  Can you think of a goal you would like to reach for your health? Patient reports no, she takes a lot of medications.  Do you have any problems getting your medications? Patient reports the only expensive medications she has is her XIFAXIFAN costing her 300.00 for 3 bottles.  Patient aware to bring blood pressure cuff, medications that do not need refrigeration and supplements to appointment    Chart review:  Recent office visits:  09/09/22 Patient presented for Flu Vaccine  Recent consult visits:   01/18/23 Lavena Bullion, DO [facility]) - Patient presented for Other cirrhosis of liver and other concerns. No medication changes.  01/13/23 Shamleffer, Melanie Crazier, MD (Endo) - Patient presented for Type 2 diabetes mellitus with diabetic polyneuropathy with long term current use of insulin. Decreased Metformin.   01/05/23 Heilingoetter, Cassandra L, PA-C - Patient presented to [facility] for Iron deficiency anemia due to chronic blood loss. No medication changes.  10/20/22 Zehr, Laban Emperor, PA-C [facility]) - Patient presented for Other Cirrhosis of liver and other concerns. Prescribed Rifaximin.   10/20/22 Cleophus Molt, RN - Clinical support encounter for iron deficiency anemia due to chronic blood loss. Patient presented for infusion.   10/13/22 Cleophus Molt, RN - Clinical support encounter for iron deficiency anemia due to chronic blood loss. Patient presented for infusion.   10/10/22 Wess Botts, MD - Patient presented for Vaginal irritation and other concerns. No medication changes.   10/06/22 Koren Shiver, RN - Clinical support encounter for iron deficiency anemia due to chronic blood loss. Patient presented for infusion.   10/05/22 Cirigliano, Dominic Pea, DO [facility]) - Patient presented for Liver cirrhosis secondary to NASH and other concerns. Increased Lactulose.   10/03/22 Nahser, Wonda Cheng, MD (Cardiology) - Patient presented for DOE. No medication changes.   09/28/22 Curt Bears, MD (Oncology) - Patient presented for Iron deficiency anemia due to chronic blood loss. No medication changes.   [id number] Rigoberto Noel, MD (Pulmonology) - Patient presented for  Hypersomnolence and other concerns. No medication changes.   08/24/22 Cirigliano, Dominic Pea, DO [facility]) - Patient presented for Hepatic cirrhosis and other concerns. Increased Lasix   08/19/22 Nahser, Wonda Cheng, MD (Cardiology) - Patient presented for Essential hypertension and other concerns. No medication  changes. Prescribed Metoprolol.   Hospital visits:  Medication Reconciliation was completed by comparing discharge summary, patient's EMR and Pharmacy list, and upon discussion with patient.  Patient presented to [facility] ED on 10/15/22 due to Hyponatremia and other concerns. Patient was present for 4 hours.  New?Medications Started at [facility] Discharge:?? -started  None Recommended Mirilax or stool softener  Medication Changes at Hospital Discharge: -Changed  none  Medications Discontinued at Hospital Discharge: -Stopped  none  Medications that remain the same after Hospital Discharge:??  -All other medications will remain the same.     Fill History : CLOBETASOL 0.05% CREAM 12/09/2022 10   ESCITALOPRAM 10 MG TABLET 01/18/2023 90   FUROSEMIDE 10 MG/ML SOLUTION 11/16/2022 30   LANTUS SOLOSTAR 100 UNIT/ML 01/06/2023 50   HUMALOG 200 UNIT/ML KWIKPEN 11/18/2022 49   BD NANO 2 GEN PEN NDL 32G 4MM 08/08/2022 30   LACTULOSE 10 GM/15 ML SOLUTION 01/24/2023 26   LOSARTAN POTASSIUM 50 MG TAB 06/21/2022 90   metformin ER 750 mg tablet,extended release 24 hr 01/13/2023 90   metoprolol tartrate 25 mg tablet 02/06/2023 90   OMEPRAZOLE DR 20 MG CAPSULE 09/22/2022 90   XIFAXAN 550 MG TABLET 01/24/2023 90   spironolactone 50 mg tablet 02/06/2023 30   [facility] ZINC 50 MG TABLET 12/15/2022 30   Star Rating Drugs:  Metformin 750 mg - Last filled 01/13/23 90 [facility]   Care Gaps: AWV- 08/10/22 Eye Exam - Overdue TDAP - Overdue Zoster Vaccine - Overdue COVID Booster - Overdue Diabetic Urine - [place] Clinical Pharmacist Assistant [phone]

## 2023-02-10 NOTE — Progress Notes (Unsigned)
Care Management & Coordination Services Pharmacy Note  02/10/2023 Name:  Dana Dyer MRN:  [id number] DOB:  1951-05-22  Summary: ***  Recommendations/Changes made from today's visit: ***  Follow up plan: ***   Subjective: Dana Dyer is an 72 y.o. year old female who is a primary patient of Burchette, Alinda Sierras, MD.  The care coordination team was consulted for assistance with disease management and care coordination needs.    {CCMTELEPHONEFACETOFACE:21091510} for initial visit.  Recent office visits: 09/09/22 - For flu vaccine  Recent consult visits: 01/18/23 Cirigliano, Dominic Pea, DO [facility]) - Patient presented for Other cirrhosis of liver and other concerns. No medication changes.   01/13/23 Shamleffer, Melanie Crazier, MD (Endo) - Patient presented for Type 2 diabetes mellitus with diabetic polyneuropathy with long term current use of insulin. Decreased Metformin.    01/05/23 Heilingoetter, Cassandra L, PA-C - Patient presented to [facility] for Iron deficiency anemia due to chronic blood loss. No medication changes.   10/20/22 Zehr, Laban Emperor, PA-C [facility]) - Patient presented for Other Cirrhosis of liver and other concerns. Prescribed Rifaximin.    10/20/22 Cleophus Molt, RN - Clinical support encounter for iron deficiency anemia due to chronic blood loss. Patient presented for infusion.    10/13/22 Cleophus Molt, RN - Clinical support encounter for iron deficiency anemia due to chronic blood loss. Patient presented for infusion.    10/10/22 Wess Botts, MD - Patient presented for Vaginal irritation and other concerns. No medication changes.    10/06/22 Koren Shiver, RN - Clinical support encounter for iron deficiency anemia due to chronic blood loss. Patient presented for infusion.    10/05/22 Cirigliano, Dominic Pea, DO [facility]) - Patient presented for Liver cirrhosis secondary to NASH and other concerns. Increased Lactulose.    10/03/22 Nahser,  Wonda Cheng, MD (Cardiology) - Patient presented for DOE. No medication changes.    09/28/22 Curt Bears, MD (Oncology) - Patient presented for Iron deficiency anemia due to chronic blood loss. No medication changes.    1012/23 Rigoberto Noel, MD (Pulmonology) - Patient presented for Hypersomnolence and other concerns. No medication changes.    08/24/22 Cirigliano, Dominic Pea, DO [facility]) - Patient presented for Hepatic cirrhosis and other concerns. Increased Lasix    08/19/22 Nahser, Wonda Cheng, MD (Cardiology) - Patient presented for Essential hypertension and other concerns. No medication changes. Prescribed Metoprolol.  Hospital visits: 10/15/22 [facility] - For hyponatremia and other concerns, no medication changes, recommended miralax or stool softener   Objective:  Lab Results  Component Value Date   CREATININE 0.85 02/03/2023   BUN 16 02/03/2023   GFR 67.95 02/03/2023   GFRNONAA >60 10/15/2022   NA 133 (L) 02/03/2023   K 4.3 02/03/2023   CALCIUM 9.7 02/03/2023   CO2 29 02/03/2023   GLUCOSE 196 (H) 02/03/2023    Lab Results  Component Value Date/Time   HGBA1C 6.0 (A) 01/13/2023 09:08 AM   HGBA1C 6.8 (A) 06/24/2022 11:16 AM   HGBA1C 8.2 (H) 10/23/2020 02:30 PM   HGBA1C 8.8 (H) 06/25/2018 10:14 AM   GFR 67.95 02/03/2023 02:38 PM   GFR 68.94 01/18/2023 02:52 PM   MICROALBUR <0.7 09/30/2021 10:43 AM   MICROALBUR <0.7 09/09/2019 09:38 AM    Last diabetic Eye exam:  Lab Results  Component Value Date/Time   HMDIABEYEEXA No Retinopathy 09/03/2020 12:00 AM    Last diabetic Foot exam:  Lab Results  Component Value Date/Time   HMDIABFOOTEX normal 09/01/2015 12:00 AM  Lab Results  Component Value Date   CHOL 154 09/30/2021   HDL 59.30 09/30/2021   LDLCALC 69 09/30/2021   LDLDIRECT 124.9 10/14/2010   TRIG 128.0 09/30/2021   CHOLHDL 3 09/30/2021       Latest Ref Rng & Units 01/18/2023    2:52 PM 11/15/2022    1:15 PM 10/19/2022   12:26 PM  Hepatic Function   Total Protein 6.0 - 8.3 g/dL 6.8  6.8  6.7   Albumin 3.5 - 5.2 g/dL 3.4  3.6  3.6   AST 0 - 37 U/L 45  45  44   ALT 0 - 35 U/L 52  54  48   Alk Phosphatase 39 - 117 U/L 86  93  83   Total Bilirubin 0.2 - 1.2 mg/dL 2.5  2.2  2.4     Lab Results  Component Value Date/Time   TSH 2.736 06/28/2022 11:28 AM   TSH 2.53 05/10/2022 10:17 AM   TSH 2.50 10/20/2021 10:09 AM       Latest Ref Rng & Units 01/05/2023    9:42 AM 11/15/2022    1:15 PM 10/19/2022   12:26 PM  CBC  WBC 4.0 - 10.5 K/uL 6.0  6.1  6.7   Hemoglobin 12.0 - 15.0 g/dL 12.1  12.9  12.2   Hematocrit 36.0 - 46.0 % 33.5  37.3  35.6   Platelets 150 - 400 K/uL 104  102.0  106.0     Lab Results  Component Value Date/Time   VD25OH 40.50 02/06/2018 09:06 AM   VD25OH 36.60 08/22/2017 04:56 PM   VITAMINB12 265 10/20/2021 10:09 AM   VITAMINB12 325 07/05/2019 11:34 AM    Clinical ASCVD: No  The 10-year ASCVD risk score (Arnett DK, et al., 2019) is: 27%   Values used to calculate the score:     Age: 52 years     Sex: Female     Is Non-Hispanic African American: No     Diabetic: Yes     Tobacco smoker: No     Systolic Blood Pressure: 123XX123 mmHg     Is BP treated: Yes     HDL Cholesterol: 59.3 mg/dL     Total Cholesterol: 154 mg/dL    DEXA 03/01/2013 showed normal BMD     08/10/2022   10:09 AM 08/09/2022    2:09 PM 05/10/2022    9:20 AM  Depression screen PHQ 2/9  Decreased Interest 0 0 1  Down, Depressed, Hopeless 0 0 0  PHQ - 2 Score 0 0 1  Altered sleeping 0 2 3  Tired, decreased energy 0 3 3  Change in appetite 0 2 0  Feeling bad or failure about yourself  0 0 0  Trouble concentrating 0 2 1  Moving slowly or fidgety/restless 0 2 0  Suicidal thoughts 0 0 0  PHQ-9 Score 0 11 8  Difficult doing work/chores Not difficult at all Not difficult at all Not difficult at all     Social History   Tobacco Use  Smoking Status Never  Smokeless Tobacco Never   BP Readings from Last 3 Encounters:  01/18/23 122/76   01/13/23 130/82  01/05/23 122/63   Pulse Readings from Last 3 Encounters:  01/18/23 80  01/13/23 83  01/05/23 83   Wt Readings from Last 3 Encounters:  01/18/23 170 lb (77.1 kg)  01/13/23 170 lb (77.1 kg)  01/05/23 167 lb 14.4 oz (76.2 kg)   BMI Readings from Last 3 Encounters:  01/18/23 30.11 kg/m  01/13/23 30.11 kg/m  01/05/23 29.74 kg/m    Allergies  Allergen Reactions   Amoxicillin-Pot Clavulanate     Severe diarrhea   Codeine Sulfate     REACTION: "hyper"   Diflucan [Fluconazole] Hives   Epinephrine Other (See Comments)    Racing heart rate   Erythromycin Base Diarrhea and Other (See Comments)    cramps   Invokana [Canagliflozin] Other (See Comments)    Recurrent yeast infections   Irbesartan-Hydrochlorothiazide Other (See Comments)    Unknown reaction   Victoza [Liraglutide] Nausea Only   Latex Rash    Long term use   Ozempic (0.25 Or 0.5 Mg-Dose) [Semaglutide(0.25 Or 0.'5mg'$ -Dos)] Nausea Only    Medications Reviewed Today     Reviewed by Lavena Bullion, DO (Physician) on 01/18/23 at 1412  Med List Status: <None>   Medication Order Taking? Sig Documenting Provider Last Dose Status Informant  BD PEN NEEDLE NANO 2ND GEN 32G X 4 MM MISC [id number] Yes USE AS DIRECTED Shamleffer, Melanie Crazier, MD Taking Active Self  clobetasol cream (TEMOVATE) 0.05 % [id number] Yes Apply 1 Application topically 2 (two) times daily as needed (yeast infection). [provider] Taking Active Self, Pharmacy Records  Continuous Blood Gluc Sensor (DEXCOM G7 SENSOR) [place] [id number] Yes 1 Device by Does not apply route as directed. Every 10 days Shamleffer, Melanie Crazier, MD Taking Active Self  escitalopram (LEXAPRO) 10 MG tablet [id number] Yes TAKE 1 TABLET BY MOUTH EVERY DAY Burchette, Alinda Sierras, MD Taking Active   furosemide (LASIX) 10 MG/ML solution [id number] Yes TAKE 2 MLS (20 MG TOTAL) BY MOUTH DAILY. Cirigliano, Vito V, DO Taking Active   insulin glargine (LANTUS  SOLOSTAR) 100 UNIT/ML Solostar Pen [id number] Yes INJECT 30 UNITS INTO THE SKIN DAILY. Shamleffer, Melanie Crazier, MD Taking Active   insulin lispro (HUMALOG KWIKPEN) 200 UNIT/ML KwikPen [id number] Yes USE A MAX OF 120 UNITS AS DIRECTED DAILY Shamleffer, Melanie Crazier, MD Taking Active   lactulose (CHRONULAC) 10 GM/15ML solution [id number] Yes TAKE 6 TABLESPOONS DAILY Pyrtle, Lajuan Lines, MD Taking Active   metFORMIN (GLUCOPHAGE-XR) 750 MG 24 hr tablet [id number] Yes Take 1 tablet (750 mg total) by mouth 2 (two) times daily. Shamleffer, Melanie Crazier, MD Taking Active   metoprolol tartrate (LOPRESSOR) 25 MG tablet [id number] Yes Take 0.5 tablets (12.5 mg total) by mouth 2 (two) times daily. Nahser, Wonda Cheng, MD Taking Active   omeprazole (PRILOSEC) 20 MG capsule [id number] Yes Take 20 mg by mouth at bedtime. [provider] Taking Active Self, Pharmacy Records  rifaximin (XIFAXAN) 550 MG TABS tablet [id number] Yes Take 1 tablet (550 mg total) by mouth 2 (two) times daily. Zehr, Laban Emperor, PA-C Taking Active   sodium chloride 1 g tablet [id number] Yes Take 1 tablet (1 g total) by mouth 3 (three) times daily with meals. Aline August, MD Taking Active   spironolactone (ALDACTONE) 50 MG tablet [id number] Yes Take 1 tablet (50 mg total) by mouth daily. Cirigliano, Vito V, DO Taking Active   zinc gluconate 50 MG tablet [id number] Yes Take 1 tablet (50 mg total) by mouth daily. Vladimir Crofts, PA-C Taking Active             SDOH:  (Social Determinants of Health) assessments and interventions performed: Yes SDOH Interventions    Flowsheet Row Clinical Support from 08/10/2022 in [place] at [place] from 08/04/2021 in [place] at [place] from 09/01/2020 in [facility] at [place] Interventions Intervention Not Indicated Intervention Not Indicated Intervention Not  Indicated  Housing Interventions Intervention Not Indicated Intervention Not Indicated Intervention Not Indicated  Transportation Interventions Intervention Not Indicated Intervention Not Indicated Intervention Not Indicated  Utilities Interventions Intervention Not Indicated -- --  Alcohol Usage Interventions Intervention Not Indicated (Score <7) -- --  Depression Interventions/Treatment  -- -- [id number] Score <4 Follow-up Not Indicated  Financial Strain Interventions Intervention Not Indicated Intervention Not Indicated Intervention Not Indicated  Physical Activity Interventions Intervention Not Indicated Intervention Not Indicated Intervention Not Indicated  Stress Interventions Intervention Not Indicated Intervention Not Indicated Intervention Not Indicated  Social Connections Interventions Intervention Not Indicated Intervention Not Indicated Intervention Not Indicated       Medication Assistance: {MEDASSISTANCEINFO:25044}  Medication Access: Within the past 30 days, how often has patient missed a dose of medication? *** Is a pillbox or other method used to improve adherence? {YES/NO:21197} Factors that may affect medication adherence? {CHL DESC; BARRIERS:21522} Are meds synced by current pharmacy? {YES/NO:21197} Are meds delivered by current pharmacy? {YES/NO:21197} Does patient experience delays in picking up medications due to transportation concerns? {YES/NO:21197}  [facility] Reviewed: Is patient disadvantaged to use [facility]?: {YES/NO:21197} Current Rx insurance plan: *** Name and location of Current pharmacy[facility]  [id number] [address]Phone: [phone]: [phone] [facility] services reviewed with patient today?: {YES/NO:21197} Patient requests to transfer care to [facility]?: {YES/NO:21197} Reason patient declined to change  pharmacies: {US patient preference:27474}  Compliance/Adherence/Medication fill history: Care Gaps: AWV- 08/10/22 Eye Exam - Overdue TDAP - Overdue Zoster Vaccine - Overdue COVID Booster - Overdue Diabetic Urine - Overdue  Star-Rating Drugs: Metformin '750mg'$  Last filled 01/13/23 90 DS   Assessment/Plan   Hypertension (BP goal <130/80) -Controlled -Current treatment: Lasix solution? Metoprolol Tartrate '25mg'$  1/2 tab BID Spironolactone '50mg'$  1 qd Corlanor '5mg'$  1 qd -Medications previously tried: Amlodipine, HCTZ, Losartan, Nadolol -Current home readings: *** -Current dietary habits: *** -Current exercise habits: *** -{ACTIONS;DENIES/REPORTS:21021675::"Denies"} hypotensive/hypertensive symptoms -Educated on {CCM BP Counseling:25124} -Counseled to monitor BP at home ***, document, and provide log at future appointments -{CCMPHARMDINTERVENTION:25122}  Hyperlipidemia: (LDL goal < 70) -Not ideally controlled -Current treatment: None -Medications previously tried: Crestor, Livalo, Pravastatin -Current dietary patterns: *** -Current exercise habits: *** -Educated on {CCM HLD Counseling:25126} -{CCMPHARMDINTERVENTION:25122}  Diabetes (A1c goal <7%) -Controlled -Current medications: Lantus 30 units qd Humalog max of 120 units daily SS Metformin '750mg'$  XR BID -Medications previously tried: Invokana, Trulicity, Ozempic -Current home glucose readings fasting glucose: *** post prandial glucose: *** -{ACTIONS;DENIES/REPORTS:21021675::"Denies"} hypoglycemic/hyperglycemic symptoms -Current meal patterns:  breakfast: ***  lunch: ***  dinner: *** snacks: *** drinks: *** -Current exercise: *** -Educated on {CCM DM COUNSELING:25123} -Counseled to check feet daily and get yearly eye exams -{CCMPHARMDINTERVENTION:25122}  Query Depression/Anxiety  -Not assessed today -Current treatment: Lexapro '10mg'$  1 qd Appropriate, Effective, Safe, Accessible  Cirrosis/NASH  -Not assessed  today -Current treatment  Xifaxan '550mg'$  1 BID Appropriate, Effective, Safe, Query Accessible Lactulose 6 tablespoons daily Appropriate, Effective, Safe, Accessible  GERD  -Not assessed today -Current treatment  Omeprazole '20mg'$  1 qd Appropriate, Effective, Safe, Accessible   OTC -Current treatment  Zinc '50mg'$  1 qd Appropriate, Effective, Safe, Accessible Sodium Cl 1 g 1 qd Appropriate, Effective, Safe, Accessible      AMaren ReamerClinical Pharmacist [phone]

## 2023-02-14 DIAGNOSIS — K7581 Nonalcoholic steatohepatitis (NASH): Secondary | ICD-10-CM | POA: Diagnosis not present

## 2023-02-14 DIAGNOSIS — K7460 Unspecified cirrhosis of liver: Secondary | ICD-10-CM | POA: Diagnosis not present

## 2023-02-14 DIAGNOSIS — E119 Type 2 diabetes mellitus without complications: Secondary | ICD-10-CM | POA: Diagnosis not present

## 2023-02-14 DIAGNOSIS — R188 Other ascites: Secondary | ICD-10-CM | POA: Diagnosis not present

## 2023-02-14 DIAGNOSIS — E871 Hypo-osmolality and hyponatremia: Secondary | ICD-10-CM | POA: Diagnosis not present

## 2023-02-17 LAB — LAB REPORT - SCANNED: EGFR: 81

## 2023-02-22 ENCOUNTER — Other Ambulatory Visit: Payer: Self-pay | Admitting: Family Medicine

## 2023-02-24 ENCOUNTER — Telehealth: Payer: Self-pay | Admitting: Gastroenterology

## 2023-02-24 NOTE — Telephone Encounter (Signed)
Inbound call from patient requesting a refill for furosemide  she have a appt coming up but she ran out .Please advise

## 2023-02-28 MED ORDER — FUROSEMIDE 20 MG PO TABS: 40.0000 mg | ORAL_TABLET | Freq: Every day | ORAL | 0 refills | Status: DC

## 2023-02-28 NOTE — Telephone Encounter (Signed)
Good Morning Dr. Henrene Pastor,   Dr. Bryan Lemma is out of the office for about 2 weeks. This patient is requesting for refill for Lasix 20 mg tablet which she is taking 2 tablets daily. Stated she will run out of her lasix before her f/u appointment with Dr. Loletha Grayer on 03/13/23. We sent in a liquid solution for few times. Stated she is taking lactulose in a liquid solution,and responding well with lasix tablet. Stated her swelling and weight coming down with this regimen.   Doc of the day, please advise on refilling this med. Thank you.

## 2023-02-28 NOTE — Telephone Encounter (Signed)
Refill her GI medications for 1 month, to allow for Dr. Vivia Ewing return.  Thanks

## 2023-02-28 NOTE — Telephone Encounter (Signed)
Lasix 40mg  sent to patient's pharmacy. Patient made aware.

## 2023-03-02 ENCOUNTER — Telehealth: Payer: Self-pay

## 2023-03-02 ENCOUNTER — Other Ambulatory Visit: Payer: Self-pay | Admitting: Gastroenterology

## 2023-03-02 NOTE — Progress Notes (Signed)
Patient ID: Dana Dyer, female   DOB: 1951-08-10, 72 y.o.   MRN: [id number]  Care Management & Coordination Services Pharmacy Team  Reason for Encounter: Appointment Reminder  Contacted patient to confirm telephone appointment with Theo Dills, PharmD on 03/03/23 at 3:15. Unsuccessful outreach. Left voicemail for patient to return call.    Star Rating Drugs:  Metformin 750 mg - [facility]     Care Gaps: AWV- 08/10/22 Eye Exam - Overdue TDAP - Overdue Zoster Vaccine - Overdue COVID Booster - Overdue Diabetic Urine - [place] Clinical Pharmacist Assistant [phone]

## 2023-03-02 NOTE — Progress Notes (Signed)
Care Management & Coordination Services Pharmacy Note  03/02/2023 Name:  KENIDEE SHIRRELL MRN:  [id number] DOB:  [birth year]  Summary: BP at goal <130/80, denies signs of fluid retention A1C at goal <6, checking sugars with sensors Last DEXA scan in 2014, due for update   Recommendations/Changes made from today's visit: Check BP once weekly and keep a log Counseled on how to correct low sugars and proper insulin use ORDER updated dexa scan, with PCP approval  Follow up plan: General in 3 months Pharmacist in 6 months   Subjective: Dana Dyer is an 72 y.o. year old female who is a primary patient of Burchette, Alinda Sierras, MD.  The care coordination team was consulted for assistance with disease management and care coordination needs.    Engaged with patient by telephone for initial visit.  Recent office visits: 09/09/22 - For flu vaccine  Recent consult visits: 02/24/23 Lavena Bullion, DO [facility]) - Stop lasix solution, start lasix 40mg  daily  01/18/23 Cirigliano, Dominic Pea, DO [facility]) - Patient presented for Other cirrhosis of liver and other concerns. No medication changes.   01/13/23 Shamleffer, Melanie Crazier, MD (Endo) - Patient presented for Type 2 diabetes mellitus with diabetic polyneuropathy with long term current use of insulin. Decreased Metformin.    01/05/23 Heilingoetter, Cassandra L, PA-C - Patient presented to [facility] for Iron deficiency anemia due to chronic blood loss. No medication changes.   10/20/22 Zehr, Laban Emperor, PA-C [facility]) - Patient presented for Other Cirrhosis of liver and other concerns. Prescribed Rifaximin.    10/20/22 Cleophus Molt, RN - Clinical support encounter for iron deficiency anemia due to chronic blood loss. Patient presented for infusion.    10/13/22 Cleophus Molt, RN - Clinical support encounter for iron deficiency anemia due to chronic blood loss. Patient presented for infusion.    10/10/22 Wess Botts,  MD - Patient presented for Vaginal irritation and other concerns. No medication changes.    10/06/22 Koren Shiver, RN - Clinical support encounter for iron deficiency anemia due to chronic blood loss. Patient presented for infusion.    10/05/22 Cirigliano, Dominic Pea, DO [facility]) - Patient presented for Liver cirrhosis secondary to NASH and other concerns. Increased Lactulose.    10/03/22 Nahser, Wonda Cheng, MD (Cardiology) - Patient presented for DOE. No medication changes.    09/28/22 Curt Bears, MD (Oncology) - Patient presented for Iron deficiency anemia due to chronic blood loss. No medication changes.    1012/23 Rigoberto Noel, MD (Pulmonology) - Patient presented for Hypersomnolence and other concerns. No medication changes.    08/24/22 Cirigliano, Dominic Pea, DO [facility]) - Patient presented for Hepatic cirrhosis and other concerns. Increased Lasix    08/19/22 Nahser, Wonda Cheng, MD (Cardiology) - Patient presented for Essential hypertension and other concerns. No medication changes. Prescribed Metoprolol.  Hospital visits: 10/15/22 [facility] - For hyponatremia and other concerns, no medication changes, recommended miralax or stool softener   Objective:  Lab Results  Component Value Date   CREATININE 0.85 02/03/2023   BUN 16 02/03/2023   GFR 67.95 02/03/2023   GFRNONAA >60 10/15/2022   NA 133 (L) 02/03/2023   K 4.3 02/03/2023   CALCIUM 9.7 02/03/2023   CO2 29 02/03/2023   GLUCOSE 196 (H) 02/03/2023    Lab Results  Component Value Date/Time   HGBA1C 6.0 (A) 01/13/2023 09:08 AM   HGBA1C 6.8 (A) 06/24/2022 11:16 AM   HGBA1C 8.2 (H) 10/23/2020 02:30 PM   HGBA1C  8.8 (H) 06/25/2018 10:14 AM   GFR 67.95 02/03/2023 02:38 PM   GFR 68.94 01/18/2023 02:52 PM   MICROALBUR <0.7 09/30/2021 10:43 AM   MICROALBUR <0.7 09/09/2019 09:38 AM    Last diabetic Eye exam:  Lab Results  Component Value Date/Time   HMDIABEYEEXA No Retinopathy 09/03/2020 12:00 AM    Last diabetic Foot  exam:  Lab Results  Component Value Date/Time   HMDIABFOOTEX normal 09/01/2015 12:00 AM     Lab Results  Component Value Date   CHOL 154 09/30/2021   HDL 59.30 09/30/2021   LDLCALC 69 09/30/2021   LDLDIRECT 124.9 10/14/2010   TRIG 128.0 09/30/2021   CHOLHDL 3 09/30/2021       Latest Ref Rng & Units 01/18/2023    2:52 PM 11/15/2022    1:15 PM 10/19/2022   12:26 PM  Hepatic Function  Total Protein 6.0 - 8.3 g/dL 6.8  6.8  6.7   Albumin 3.5 - 5.2 g/dL 3.4  3.6  3.6   AST 0 - 37 U/L 45  45  44   ALT 0 - 35 U/L 52  54  48   Alk Phosphatase 39 - 117 U/L 86  93  83   Total Bilirubin 0.2 - 1.2 mg/dL 2.5  2.2  2.4     Lab Results  Component Value Date/Time   TSH 2.736 06/28/2022 11:28 AM   TSH 2.53 05/10/2022 10:17 AM   TSH 2.50 10/20/2021 10:09 AM       Latest Ref Rng & Units 01/05/2023    9:42 AM 11/15/2022    1:15 PM 10/19/2022   12:26 PM  CBC  WBC 4.0 - 10.5 K/uL 6.0  6.1  6.7   Hemoglobin 12.0 - 15.0 g/dL 12.1  12.9  12.2   Hematocrit 36.0 - 46.0 % 33.5  37.3  35.6   Platelets 150 - 400 K/uL 104  102.0  106.0     Lab Results  Component Value Date/Time   VD25OH 40.50 02/06/2018 09:06 AM   VD25OH 36.60 08/22/2017 04:56 PM   VITAMINB12 265 10/20/2021 10:09 AM   VITAMINB12 325 07/05/2019 11:34 AM    Clinical ASCVD: No  The 10-year ASCVD risk score (Arnett DK, et al., 2019) is: 27%   Values used to calculate the score:     Age: 72 years     Sex: Female     Is Non-Hispanic African American: No     Diabetic: Yes     Tobacco smoker: No     Systolic Blood Pressure: 123XX123 mmHg     Is BP treated: Yes     HDL Cholesterol: 59.3 mg/dL     Total Cholesterol: 154 mg/dL    DEXA 03/01/2013 showed normal BMD     08/10/2022   10:09 AM 08/09/2022    2:09 PM 05/10/2022    9:20 AM  Depression screen PHQ 2/9  Decreased Interest 0 0 1  Down, Depressed, Hopeless 0 0 0  PHQ - 2 Score 0 0 1  Altered sleeping 0 2 3  Tired, decreased energy 0 3 3  Change in appetite 0 2 0   Feeling bad or failure about yourself  0 0 0  Trouble concentrating 0 2 1  Moving slowly or fidgety/restless 0 2 0  Suicidal thoughts 0 0 0  PHQ-9 Score 0 11 8  Difficult doing work/chores Not difficult at all Not difficult at all Not difficult at all     Social History  Tobacco Use  Smoking Status Never  Smokeless Tobacco Never   BP Readings from Last 3 Encounters:  01/18/23 122/76  01/13/23 130/82  01/05/23 122/63   Pulse Readings from Last 3 Encounters:  01/18/23 80  01/13/23 83  01/05/23 83   Wt Readings from Last 3 Encounters:  01/18/23 170 lb (77.1 kg)  01/13/23 170 lb (77.1 kg)  01/05/23 167 lb 14.4 oz (76.2 kg)   BMI Readings from Last 3 Encounters:  01/18/23 30.11 kg/m  01/13/23 30.11 kg/m  01/05/23 29.74 kg/m    Allergies  Allergen Reactions   Amoxicillin-Pot Clavulanate     Severe diarrhea   Codeine Sulfate     REACTION: "hyper"   Diflucan [Fluconazole] Hives   Epinephrine Other (See Comments)    Racing heart rate   Erythromycin Base Diarrhea and Other (See Comments)    cramps   Invokana [Canagliflozin] Other (See Comments)    Recurrent yeast infections   Irbesartan-Hydrochlorothiazide Other (See Comments)    Unknown reaction   Victoza [Liraglutide] Nausea Only   Latex Rash    Long term use   Ozempic (0.25 Or 0.5 Mg-Dose) [Semaglutide(0.25 Or 0.5mg -Dos)] Nausea Only    Medications Reviewed Today     Reviewed by Lavena Bullion, DO (Physician) on 01/18/23 at 1412  Med List Status: <None>   Medication Order Taking? Sig Documenting Provider Last Dose Status Informant  BD PEN NEEDLE NANO 2ND GEN 32G X 4 MM MISC [id number] Yes USE AS DIRECTED Shamleffer, Melanie Crazier, MD Taking Active Self  clobetasol cream (TEMOVATE) 0.05 % [id number] Yes Apply 1 Application topically 2 (two) times daily as needed (yeast infection). [provider] Taking Active Self, Pharmacy Records  Continuous Blood Gluc Sensor (DEXCOM G7 SENSOR) [place]  [id number] Yes 1 Device by Does not apply route as directed. Every 10 days Shamleffer, Melanie Crazier, MD Taking Active Self  escitalopram (LEXAPRO) 10 MG tablet [id number] Yes TAKE 1 TABLET BY MOUTH EVERY DAY Burchette, Alinda Sierras, MD Taking Active   furosemide (LASIX) 10 MG/ML solution [id number] Yes TAKE 2 MLS (20 MG TOTAL) BY MOUTH DAILY. Cirigliano, Vito V, DO Taking Active   insulin glargine (LANTUS SOLOSTAR) 100 UNIT/ML Solostar Pen [id number] Yes INJECT 30 UNITS INTO THE SKIN DAILY. Shamleffer, Melanie Crazier, MD Taking Active   insulin lispro (HUMALOG KWIKPEN) 200 UNIT/ML KwikPen [id number] Yes USE A MAX OF 120 UNITS AS DIRECTED DAILY Shamleffer, Melanie Crazier, MD Taking Active   lactulose (CHRONULAC) 10 GM/15ML solution [id number] Yes TAKE 6 TABLESPOONS DAILY Pyrtle, Lajuan Lines, MD Taking Active   metFORMIN (GLUCOPHAGE-XR) 750 MG 24 hr tablet [id number] Yes Take 1 tablet (750 mg total) by mouth 2 (two) times daily. Shamleffer, Melanie Crazier, MD Taking Active   metoprolol tartrate (LOPRESSOR) 25 MG tablet [id number] Yes Take 0.5 tablets (12.5 mg total) by mouth 2 (two) times daily. Nahser, Wonda Cheng, MD Taking Active   omeprazole (PRILOSEC) 20 MG capsule [id number] Yes Take 20 mg by mouth at bedtime. [provider] Taking Active Self, Pharmacy Records  rifaximin (XIFAXAN) 550 MG TABS tablet [id number] Yes Take 1 tablet (550 mg total) by mouth 2 (two) times daily. Zehr, Laban Emperor, PA-C Taking Active   sodium chloride 1 g tablet [id number] Yes Take 1 tablet (1 g total) by mouth 3 (three) times daily with meals. Aline August, MD Taking Active   spironolactone (ALDACTONE) 50 MG tablet [id number] Yes Take 1 tablet (50 mg total) by mouth daily. Cirigliano, Vito V, DO Taking Active   zinc  gluconate 50 MG tablet [id number] Yes Take 1 tablet (50 mg total) by mouth daily. Vladimir Crofts, PA-C Taking Active             SDOH:  (Social Determinants of Health) assessments and interventions performed:  Yes SDOH Interventions    Flowsheet Row Clinical Support from 08/10/2022 in [place] at [place] from 08/04/2021 in [place] [facility] [place] from 09/01/2020 in [place] at [place] Interventions Intervention Not Indicated Intervention Not Indicated Intervention Not Indicated  Housing Interventions Intervention Not Indicated Intervention Not Indicated Intervention Not Indicated  Transportation Interventions Intervention Not Indicated Intervention Not Indicated Intervention Not Indicated  Utilities Interventions Intervention Not Indicated -- --  Alcohol Usage Interventions Intervention Not Indicated (Score <7) -- --  Depression Interventions/Treatment  -- -- [id number] Score <4 Follow-up Not Indicated  Financial Strain Interventions Intervention Not Indicated Intervention Not Indicated Intervention Not Indicated  Physical Activity Interventions Intervention Not Indicated Intervention Not Indicated Intervention Not Indicated  Stress Interventions Intervention Not Indicated Intervention Not Indicated Intervention Not Indicated  Social Connections Interventions Intervention Not Indicated Intervention Not Indicated Intervention Not Indicated       Medication Assistance: None required.  Patient affirms current coverage meets needs.  Medication Access: Within the past 30 days, how often has patient missed a dose of medication? None Is a pillbox or other method used to improve adherence? Yes  Factors that may affect medication adherence? no barriers identified Are meds synced by current pharmacy? No  Are meds delivered by current pharmacy? Yes  Does patient experience delays in picking up medications due to transportation concerns? No   [facility] Services Reviewed: Is patient disadvantaged to use [facility]?: No  Current Rx insurance plan: [employer] Name and  location of Current pharmacy:  [id number] - [place], [address] AT [address] [place] [address] Phone: [phone] Fax: [phone]  [facility] services reviewed with patient today?: No  Patient requests to transfer care to [facility]?: No  Reason patient declined to change pharmacies: Prefers to go out and pick up medications themselves  Compliance/Adherence/Medication fill history: Care Gaps: AWV- 08/10/22 Eye Exam - Overdue TDAP - Overdue Zoster Vaccine - Overdue COVID Booster - Overdue Diabetic Urine - Overdue  Star-Rating Drugs: Metformin 750mg  Last filled 01/13/23 90 DS   Assessment/Plan   Hypertension (BP goal <130/80) -Controlled -Current treatment: Furosemide 40mg  1 tab daily Appropriate, Effective, Safe, Accessible Metoprolol Tartrate 25mg  1/2 tab BID Appropriate, Effective, Safe, Accessible Spironolactone 50mg  1 qd Appropriate, Effective, Safe, Accessible -Medications previously tried: Amlodipine, HCTZ, Losartan, Nadolol -Current home readings: Checks several times/week -Current dietary habits: Not discussed -Current exercise habits: Goes walking daily and goal to get backing into golf, has a husband that golfs too -Denies hypotensive/hypertensive symptoms -Educated on BP goals and benefits of medications for prevention of heart attack, stroke and kidney damage; Importance of home blood pressure monitoring; Symptoms of hypotension and importance of maintaining adequate hydration; -Counseled to monitor BP at home  , document, and provide log at future appointments -Recommended to continue current medication  Hyperlipidemia: (LDL goal < 70) -Not ideally controlled -Current treatment: None -Medications previously tried: Crestor, Livalo, Pravastatin -Current dietary patterns:  -Current exercise habits: see above -Educated on Cholesterol goals;  -Counseled on diet and exercise  extensively  Diabetes (A1c goal <7%) -Controlled -Current medications: Lantus 28 units  qhs  Humalog max of 120 units daily SS  14 units - 18 units Appropriate, Effective, Safe, Accessible Metformin 750mg  XR BID Appropriate, Effective, Safe, Accessible -Medications previously tried: Invokana, Trulicity, Ozempic -Current home glucose readings Has Dexcom sensor Less than 300 Invite sent to request access -Denies hypoglycemic/hyperglycemic symptoms -Current meal patterns:  Drinks water, hot tea and diet coke Eats 3 meals a day but dinner is normally something light -Current exercise: see above -Educated on A1c and blood sugar goals; Complications of diabetes including kidney damage, retinal damage, and cardiovascular disease; Proper insulin injection technique; Prevention and management of hypoglycemic episodes; Continuous glucose monitoring; -Counseled to check feet daily and get yearly eye exams -Counseled on diet and exercise extensively Recommended to continue current medication  Query Depression/Anxiety  -Not assessed today -Current treatment: Lexapro 10mg  1 qd Appropriate, Effective, Safe, Accessible  Cirrosis/NASH  -Not assessed today -Current treatment  Xifaxan 550mg  1 BID Appropriate, Effective, Safe, Query Accessible Lactulose 6 tablespoons daily Appropriate, Effective, Safe, Accessible  GERD  -Not assessed today -Current treatment  Omeprazole 20mg  1 qd Appropriate, Effective, Safe, Accessible   OTC -Current treatment  Zinc 50mg  1 qd Appropriate, Effective, Safe, Accessible  Maren Reamer Clinical Pharmacist [phone]

## 2023-03-03 ENCOUNTER — Ambulatory Visit: Payer: Medicare PPO

## 2023-03-08 ENCOUNTER — Other Ambulatory Visit: Payer: Self-pay | Admitting: Internal Medicine

## 2023-03-13 ENCOUNTER — Other Ambulatory Visit (INDEPENDENT_AMBULATORY_CARE_PROVIDER_SITE_OTHER): Payer: Medicare PPO

## 2023-03-13 ENCOUNTER — Ambulatory Visit: Payer: Medicare PPO | Admitting: Gastroenterology

## 2023-03-13 ENCOUNTER — Encounter: Payer: Self-pay | Admitting: Gastroenterology

## 2023-03-13 VITALS — BP 110/62 | HR 80 | Ht 63.0 in | Wt 162.0 lb

## 2023-03-13 DIAGNOSIS — K641 Second degree hemorrhoids: Secondary | ICD-10-CM | POA: Diagnosis not present

## 2023-03-13 DIAGNOSIS — Z860100 Personal history of colon polyps, unspecified: Secondary | ICD-10-CM

## 2023-03-13 DIAGNOSIS — K649 Unspecified hemorrhoids: Secondary | ICD-10-CM

## 2023-03-13 DIAGNOSIS — K921 Melena: Secondary | ICD-10-CM

## 2023-03-13 DIAGNOSIS — K7460 Unspecified cirrhosis of liver: Secondary | ICD-10-CM

## 2023-03-13 DIAGNOSIS — K7581 Nonalcoholic steatohepatitis (NASH): Secondary | ICD-10-CM

## 2023-03-13 DIAGNOSIS — K219 Gastro-esophageal reflux disease without esophagitis: Secondary | ICD-10-CM

## 2023-03-13 DIAGNOSIS — K7682 Hepatic encephalopathy: Secondary | ICD-10-CM

## 2023-03-13 DIAGNOSIS — Z8719 Personal history of other diseases of the digestive system: Secondary | ICD-10-CM | POA: Diagnosis not present

## 2023-03-13 LAB — BASIC METABOLIC PANEL
BUN: 18 mg/dL (ref 6–23)
CO2: 25 mEq/L (ref 19–32)
Calcium: 9.7 mg/dL (ref 8.4–10.5)
Chloride: 93 mEq/L — ABNORMAL LOW (ref 96–112)
Creatinine, Ser: 0.82 mg/dL (ref 0.40–1.20)
GFR: 70.89 mL/min (ref >60.00)
Glucose, Bld: 87 mg/dL (ref 70–99)
Potassium: 3.6 mEq/L (ref 3.5–5.1)
Sodium: 128 mEq/L — ABNORMAL LOW (ref 135–145)

## 2023-03-13 NOTE — Patient Instructions (Addendum)
Your provider has requested that you go to the basement level for lab work before leaving today. Press "B" on the elevator. The lab is located at the first door on the left as you exit the elevator.   You have been scheduled for an appointment with Dr. Barron Alvine on 5/29 at 11:20AM. Please arrive 10 minutes early for your appointment.   Take Pepcid 20 MG over the counter two times daily.  HEMORRHOID BANDING PROCEDURE    FOLLOW-UP CARE   The procedure you have had should have been relatively painless since the banding of the area involved does not have nerve endings and there is no pain sensation.  The rubber band cuts off the blood supply to the hemorrhoid and the band may fall off as soon as 48 hours after the banding (the band may occasionally be seen in the toilet bowl following a bowel movement). You may notice a temporary feeling of fullness in the rectum which should respond adequately to plain Tylenol or Motrin.  Following the banding, avoid strenuous exercise that evening and resume full activity the next day.  A sitz bath (soaking in a warm tub) or bidet is soothing, and can be useful for cleansing the area after bowel movements.     To avoid constipation, take two tablespoons of natural wheat bran, natural oat bran, flax, Benefiber or any over the counter fiber supplement and increase your water intake to 7-8 glasses daily.    Unless you have been prescribed anorectal medication, do not put anything inside your rectum for two weeks: No suppositories, enemas, fingers, etc.  Occasionally, you may have more bleeding than usual after the banding procedure.  This is often from the untreated hemorrhoids rather than the treated one.  Don't be concerned if there is a tablespoon or so of blood.  If there is more blood than this, lie flat with your bottom higher than your head and apply an ice pack to the area. If the bleeding does not stop within a half an hour or if you feel faint, call our  office at [phone] or go to the emergency room.  Problems are not common; however, if there is a substantial amount of bleeding, severe pain, chills, fever or difficulty passing urine (very rare) or other problems, you should call us at [phone] or report to the nearest emergency room.  Do not stay seated continuously for more than 2-3 hours for a day or two after the procedure.  Tighten your buttock muscles 10-15 times every two hours and take 10-15 deep breaths every 1-2 hours.  Do not spend more than a few minutes on the toilet if you cannot empty your bowel; instead re-visit the toilet at a later time.    Thank you for choosing me and [facility].  Vito Cirigliano, D.O.

## 2023-03-13 NOTE — Progress Notes (Unsigned)
Chief Complaint:    Symptomatic Internal Hemorrhoids; Hemorrhoid Band Ligation  GI History: 1.  History of adenomatous polyps in her colon.  Colonoscopy 2006 Dr. Barnett Abu found no polyps.  Colonoscopy Dr. Christella Hartigan December 2016 found 7 subcentimeter polyps.  These were all adenomatous polyps on pathology.  Also documented left-sided diverticulosis.  Dr. Christella Hartigan recommended she have a repeat colonoscopy at 3-year interval.   Colonoscopy January 2020 four polyps were removed from her colon, all were adenomatous on pathology.  She was recommended to have repeat colonoscopy at 3-year interval.  Colonoscopy 03/2022 2 subcentimeter adenomas.  A 1 cm ulcer in the cecum was noted this did not appear malignant.  Biopsies showed no neoplasm, suspect from meloxicam she was taken for back pains.  Previous tattoo of colon area was normal. Recommended 5 year repeat.    2. Cirrhosis, likely from longstanding fatty liver disease, out-of-control diabetes.  Diagnosed 2019. - Lab workup 2019: Hep C Ab negative, Hep B Surface Ag negative, hepatitis B surface antibody negative, hep B Core Igm negative, Hep A IgM negative. Ferritin normal, HIV negative, ANA negative, antimitochondrial antibody negative, hepatitis a antibody total negative; slightly low platelets, transaminases slightly elevated.  No edema. - [place] 07/2018: slightly nodular, suspicious for cirrhosis. - [place] 12/2019 cirrhosis without mass lesions. - Ultrasound 08/2020 showed cirrhosis without focal mass lesions. Gallbladder thick but without Murphy sign. - Ultrasound May 2022 showed cirrhosis, trace perihepatic ascites, no focal lesions in the liver. - [place] 12/2021 nodular liver, no masses.  - EGD 12/2018 small distal esophagus varices, mild portal gastropathy, no gastric varices.  She was started on nadolol (eventually at 40mg  daily). - EGD 03/2022 small esophagus varices, mild portal gastropathy.  Biopsies of minimal gastritis showed no H. pylori - AFP 11/2021 normal -  MELD 12 (11/2021 labs) - Hepatitis A, B vaccination series started 11/2018 -11/2021: Mild encephalopathy, memory disturbance, slight tremors, ammonia 65. Started lactulose which might have raised her blood sugars a bit. -07/29/2022 - 08/01/2022: Hospital admission for hyponatremia 2/2 hypovolemia.  NA 120 on admission, improved to 130s by DC.  Discharged home with salt tablets to hold diuretics (was actually diuretics prior to hospitalization) -07/29/2022: CT A/P: Cirrhotic appearing liver, no HCC.  Evidence of portal hypertension (varices, splenorenal shunt, small volume ascites, mild splenomegaly), multiple cystic lesions in the uncinate process of the pancreas measuring up to 4.1 cm favoring sidebranch IPMN's.  Patient was not found to be a candidate for EUS based on hyponatremia and recommended MRI repeat in 3 months - 08/04/22: GI follow-up with Hyacinth Meeker.  Was still taking salt tablets.  Not taking any diuretics. - 08/09/2022: NA 136, ammonia 57 - 08/15/2022: Patient called to states she is accumulating fluid. - 08/15/2022: Abdominal ultrasound: New small to moderate ascites, GB wall thickening related to ascites/hepatic dysfunction.  Cirrhosis without HCC.  Restart Aldactone 25 mg qod and Lasix 10 mg qod (to take on alternating days).  Placed referral to Nephrology for medication intolerant ascites     Cirrhosis Evaluation: - Etiology: NASH - Complications: Hepatic encephalopathy, hyponatremia, ascites, diuretic intolerance, esophageal varices, portal hypertensive gastropathy - HCC screening: UTD - Variceal screening: EGD 03/2022: Small esophageal varices - Serologic evaluation: Negative/normal as above - Viral hepatitis vaccination: UTD - Flu vaccine: UTD - Liver biopsy: None - Medications: Lactulose, metoprolol, Lasix 20 mg/day, Aldactone 50 mg/day.  Was previously prescribed rifaximin, but today reports she has not been taking this - MELD 3.0: 19 - Child Pugh score: B  3.  Symptomatic  hemorrhoids Colonoscopy in 03/2022 with internal hemorrhoids.  Hemorrhoid symptoms include intermittent scant BRBPR, rectal itching, irritation.  Symptoms not fully responsive to adequate trial of topical therapy.  HPI:     Patient is a 72 y.o. femalewith a history of symptomatic internal hemorrhoids presenting to the [facility] for follow-up and ongoing treatment. The patient presents with symptomatic grade 2 hemorrhoids, unresponsive to maximal medical therapy, requesting rubber band ligation of symptomatic hemorrhoidal disease.  Separately, has been having nocturnal breakthrough reflux, described as regurgitation, choking sensation, and cough that wakes her from sleep. Is prescribed Prilosec, but not taking it. She is taking Pepcid 20 mg QAM which controls daytime reflux well. No dysphagia.   No change in medical or surgical history, medications, allergies, social history since last appointment with me.   Review of systems:     No chest pain, no SOB, no fevers, no urinary sx   Past Medical History:  Diagnosis Date   ALLERGIC RHINITIS 10/09/2007   Allergy    Arthritis    hips, spine   DIABETES MELLITUS, UNCONTROLLED 03/16/2009   FATIGUE 10/21/2008   FIBROMYALGIA 10/09/2007   GERD (gastroesophageal reflux disease)    diet  controlled, no meds   H/O vaginal hysterectomy    HYPERLIPIDEMIA 10/09/2007   diet controlled, no meds   HYPERTENSION 10/09/2007   Morton's neuroma    OBSTRUCTIVE SLEEP APNEA 10/09/2007   Palpitations    Plantar fasciitis    History - right   Sleep apnea    Uses CPAP   TRANSAMINASES, SERUM, ELEVATED 07/08/2009    Patient's surgical history, family medical history, social history, medications and allergies were all reviewed in Epic    Current Outpatient Medications  Medication Sig Dispense Refill   BD PEN NEEDLE NANO 2ND GEN 32G X 4 MM MISC USE AS DIRECTED 400 each 3   clobetasol cream (TEMOVATE) 0.05 % Apply 1 Application topically 2 (two)  times daily as needed (yeast infection).     Continuous Blood Gluc Sensor (DEXCOM G7 SENSOR) MISC 1 Device by Does not apply route as directed. Every 10 days 9 each 3   escitalopram (LEXAPRO) 10 MG tablet TAKE 1 TABLET BY MOUTH EVERY DAY 90 tablet 0   furosemide (LASIX) 20 MG tablet Take 2 tablets (40 mg total) by mouth daily. 60 tablet 0   insulin glargine (LANTUS SOLOSTAR) 100 UNIT/ML Solostar Pen INJECT 30 UNITS INTO THE SKIN DAILY. 45 mL 0   insulin lispro (HUMALOG KWIKPEN) 200 UNIT/ML KwikPen USE A MAX OF 120 UNITS AS DIRECTED DAILY 30 mL 1   lactulose (CHRONULAC) 10 GM/15ML solution TAKE 6 TABLESPOONS DAILY 2430 mL 1   metFORMIN (GLUCOPHAGE-XR) 750 MG 24 hr tablet Take 1 tablet (750 mg total) by mouth 2 (two) times daily. 180 tablet 3   metoprolol tartrate (LOPRESSOR) 25 MG tablet Take 0.5 tablets (12.5 mg total) by mouth 2 (two) times daily. 90 tablet 3   omeprazole (PRILOSEC) 20 MG capsule Take 20 mg by mouth at bedtime.     rifaximin (XIFAXAN) 550 MG TABS tablet Take 1 tablet (550 mg total) by mouth 2 (two) times daily. 180 tablet 3   spironolactone (ALDACTONE) 50 MG tablet TAKE 1 TABLET BY MOUTH EVERY DAY 90 tablet 1   zinc gluconate 50 MG tablet Take 1 tablet (50 mg total) by mouth daily. 30 tablet 3   No current facility-administered medications for this visit.    Physical Exam:     BP  110/62   Pulse 80   Ht 5\' 3"  (1.6 m)   Wt 162 lb (73.5 kg)   BMI 28.70 kg/m   GENERAL:  Pleasant female in NAD PSYCH: : Cooperative, normal affect NEURO: Alert and oriented x 3, no focal neurologic deficits Rectal exam: Sensation intact and preserved anal wink.  Grade 2 hemorrhoids noted in RP and LL positions, and grade 1 hemorrhoid in RA position on anoscopy.  No external anal fissures noted. Normal sphincter tone. No palpable mass. No blood on the exam glove. (Chaperone: Ailene Rud, CMA).   IMPRESSION and PLAN:    #1.  Symptomatic internal hemorrhoids: PROCEDURE NOTE: The patient  presents with symptomatic grade 2  hemorrhoids, unresponsive to maximal medical therapy, requesting rubber band ligation of symptomatic hemorrhoidal disease.  All risks, benefits and alternative forms of therapy were described and informed consent was obtained.  In the Left Lateral Decubitus position, anoscopic examination revealed grade 2 hemorrhoids in the RP and LL, with grade 1 in RA position(s).  The anorectum was pre-medicated with RectiCare. The decision was made to band the RP internal hemorrhoid, and the [facility]'Regan System was used to perform band ligation without complication.  Digital anorectal examination was then performed to assure proper positioning of the band, and to adjust the banded tissue as required.  The patient was discharged home without pain or other issues.  Dietary and behavioral recommendations were given and along with follow-up instructions.     The following adjunctive treatments were recommended:  -Resume high-fiber diet with fiber supplement (i.e. Citrucel or Benefiber) with goal for soft stools without straining to have a BM. -Resume adequate fluid intake.  The patient will return in 4 for follow-up and possible additional banding as required. No complications were encountered and the patient tolerated the procedure well.      2) GERD 3) Regurgitation Breakthrough nocturnal reflux symptoms.  Most recent EGD in 03/2022 with small esophageal varices, but no erosive esophagitis. - Increase Pepcid to BID for 4 weeks to control noctrunal breakthrough sxs. If well controlled, can either reduce to daily dosing (remove AM dose) or continue with BID for time being -Continue antireflux lifestyle/dietary modifications with avoidance of exacerbating foods -Recommended that she avoid eating within 3 hours of bedtime and try to sleep with HOB elevated  4) NASH cirrhosis 5) Esophageal varices 6) Ascites 7) Portal hypertensive gastropathy 8) Hepatic encephalopathy -  Continue current medications as prescribed - Repeat BMP check  9) History of colon polyps - Repeat colonoscopy in 2028  I spent an additional 20 minutes of nonprocedural time, including in depth chart review, independent review of results as outlined above, communicating results with the patient directly, face-to-face time with the patient, coordinating care, and ordering studies and medications as appropriate, and documentation.   Dana Dyer ,DO, FACG 03/13/2023, 3:52 PM

## 2023-03-22 ENCOUNTER — Other Ambulatory Visit: Payer: Self-pay | Admitting: Internal Medicine

## 2023-03-22 ENCOUNTER — Ambulatory Visit: Payer: Medicare PPO | Admitting: Internal Medicine

## 2023-04-06 ENCOUNTER — Other Ambulatory Visit: Payer: Self-pay

## 2023-04-06 ENCOUNTER — Inpatient Hospital Stay: Payer: Medicare PPO | Admitting: Internal Medicine

## 2023-04-06 ENCOUNTER — Inpatient Hospital Stay: Payer: Medicare PPO | Attending: Physician Assistant

## 2023-04-06 VITALS — BP 133/71 | HR 97 | Temp 98.1°F | Resp 18 | Wt 157.3 lb

## 2023-04-06 DIAGNOSIS — D500 Iron deficiency anemia secondary to blood loss (chronic): Secondary | ICD-10-CM | POA: Diagnosis not present

## 2023-04-06 DIAGNOSIS — D693 Immune thrombocytopenic purpura: Secondary | ICD-10-CM | POA: Diagnosis not present

## 2023-04-06 LAB — CBC WITH DIFFERENTIAL (CANCER CENTER ONLY)
Abs Immature Granulocytes: 0.02 10*3/uL (ref 0.00–0.07)
Basophils Absolute: 0.1 10*3/uL (ref 0.0–0.1)
Basophils Relative: 1 %
Eosinophils Absolute: 0.2 10*3/uL (ref 0.0–0.5)
Eosinophils Relative: 2 %
HCT: 34.1 % — ABNORMAL LOW (ref 36.0–46.0)
Hemoglobin: 11.8 g/dL — ABNORMAL LOW (ref 12.0–15.0)
Immature Granulocytes: 0 %
Lymphocytes Relative: 19 %
Lymphs Abs: 1.4 10*3/uL (ref 0.7–4.0)
MCH: 31.2 pg (ref 26.0–34.0)
MCHC: 34.6 g/dL (ref 30.0–36.0)
MCV: 90.2 fL (ref 80.0–100.0)
Monocytes Absolute: 1 10*3/uL (ref 0.1–1.0)
Monocytes Relative: 13 %
Neutro Abs: 4.8 10*3/uL (ref 1.7–7.7)
Neutrophils Relative %: 65 %
Platelet Count: 131 10*3/uL — ABNORMAL LOW (ref 150–400)
RBC: 3.78 MIL/uL — ABNORMAL LOW (ref 3.87–5.11)
RDW: 14.1 % (ref 11.5–15.5)
WBC Count: 7.4 10*3/uL (ref 4.0–10.5)
nRBC: 0 % (ref 0.0–0.2)

## 2023-04-06 LAB — IRON AND IRON BINDING CAPACITY (CC-WL,HP ONLY)
Iron: 103 ug/dL (ref 28–170)
Saturation Ratios: 19 % (ref 10.4–31.8)
TIBC: 546 ug/dL — ABNORMAL HIGH (ref 250–450)
UIBC: 443 ug/dL — ABNORMAL HIGH (ref 148–442)

## 2023-04-06 NOTE — Progress Notes (Signed)
[facility] [phone]   [phone]  OFFICE PROGRESS NOTE  Kristian Covey, MD [address] [place] [address]  DIAGNOSIS:  1) history of iron deficiency anemia likely secondary to gastrointestinal blood loss. The patient has intolerance to the oral iron tablets. 2) history of ITP.  PRIOR THERAPY: Venofer 300 Mg IV weekly for 3 weeks on as-needed basis last treatment was given 07/01/2022.  CURRENT THERAPY: None  INTERVAL HISTORY: Dana Dyer 72 y.o. female returns to the clinic today for follow-up visit.  The patient is feeling fine today with no concerning complaints except for mild fatigue.  She denied having any current bleeding, bruises or ecchymosis.  She has no chest pain, shortness of breath, cough or hemoptysis.  She has no nausea, vomiting, diarrhea or constipation.  She has no headache or visual changes.  She has no recent weight loss or night sweats.  She is here today for evaluation and repeat blood work.  MEDICAL HISTORY: Past Medical History:  Diagnosis Date   ALLERGIC RHINITIS 10/09/2007   Allergy    Arthritis    hips, spine   DIABETES MELLITUS, UNCONTROLLED 03/16/2009   FATIGUE 10/21/2008   FIBROMYALGIA 10/09/2007   GERD (gastroesophageal reflux disease)    diet  controlled, no meds   H/O vaginal hysterectomy    HYPERLIPIDEMIA 10/09/2007   diet controlled, no meds   HYPERTENSION 10/09/2007   Morton's neuroma    OBSTRUCTIVE SLEEP APNEA 10/09/2007   Palpitations    Plantar fasciitis    History - right   Sleep apnea    Uses CPAP   TRANSAMINASES, SERUM, ELEVATED 07/08/2009    ALLERGIES:  is allergic to amoxicillin-pot clavulanate, codeine sulfate, diflucan [fluconazole], epinephrine, erythromycin base, invokana [canagliflozin], irbesartan-hydrochlorothiazide, victoza [liraglutide], latex, and ozempic (0.25 or 0.5 mg-dose) [semaglutide(0.25 or 0.5mg -dos)].  MEDICATIONS:  Current Outpatient  Medications  Medication Sig Dispense Refill   BD PEN NEEDLE NANO 2ND GEN 32G X 4 MM MISC USE AS DIRECTED 400 each 3   clobetasol cream (TEMOVATE) 0.05 % Apply 1 Application topically 2 (two) times daily as needed (yeast infection).     Continuous Blood Gluc Sensor (DEXCOM G7 SENSOR) MISC 1 Device by Does not apply route as directed. Every 10 days 9 each 3   escitalopram (LEXAPRO) 10 MG tablet TAKE 1 TABLET BY MOUTH EVERY DAY 90 tablet 0   furosemide (LASIX) 20 MG tablet TAKE 2 TABLETS (40 MG TOTAL) BY MOUTH DAILY. 180 tablet 1   insulin glargine (LANTUS SOLOSTAR) 100 UNIT/ML Solostar Pen INJECT 30 UNITS INTO THE SKIN DAILY. 45 mL 0   insulin lispro (HUMALOG KWIKPEN) 200 UNIT/ML KwikPen USE A MAX OF 120 UNITS AS DIRECTED DAILY 30 mL 1   lactulose (CHRONULAC) 10 GM/15ML solution TAKE 6 TABLESPOONS DAILY 2430 mL 1   metFORMIN (GLUCOPHAGE-XR) 750 MG 24 hr tablet Take 1 tablet (750 mg total) by mouth 2 (two) times daily. 180 tablet 3   metoprolol tartrate (LOPRESSOR) 25 MG tablet Take 0.5 tablets (12.5 mg total) by mouth 2 (two) times daily. 90 tablet 3   omeprazole (PRILOSEC) 20 MG capsule Take 20 mg by mouth at bedtime.     rifaximin (XIFAXAN) 550 MG TABS tablet Take 1 tablet (550 mg total) by mouth 2 (two) times daily. 180 tablet 3   spironolactone (ALDACTONE) 50 MG tablet TAKE 1 TABLET BY MOUTH EVERY DAY 90 tablet 1   zinc gluconate 50 MG tablet Take 1 tablet (  50 mg total) by mouth daily. 30 tablet 3   No current facility-administered medications for this visit.    SURGICAL HISTORY:  Past Surgical History:  Procedure Laterality Date   ABDOMINAL HYSTERECTOMY  1979   prolapsed uterus   BASAL CELL CARCINOMA EXCISION     BREAST BIOPSY Left 2012   left 2012 stereo   BREAST REDUCTION SURGERY Bilateral 1992   COLONOSCOPY  2006   [facility]   mamoplasty     reduction   REDUCTION MAMMAPLASTY Bilateral 1992   TONSILLECTOMY  1959   TUBAL LIGATION  1976    REVIEW OF SYSTEMS:  A comprehensive  review of systems was negative except for: Constitutional: positive for fatigue   PHYSICAL EXAMINATION: General appearance: alert, cooperative, fatigued, and no distress Head: Normocephalic, without obvious abnormality, atraumatic Neck: no adenopathy, no JVD, supple, symmetrical, trachea midline, and thyroid not enlarged, symmetric, no tenderness/mass/nodules Lymph nodes: Cervical, supraclavicular, and axillary nodes normal. Resp: clear to auscultation bilaterally Back: symmetric, no curvature. ROM normal. No CVA tenderness. Cardio: regular rate and rhythm, S1, S2 normal, no murmur, click, rub or gallop GI: soft, non-tender; bowel sounds normal; no masses,  no organomegaly Extremities: extremities normal, atraumatic, no cyanosis or edema  ECOG PERFORMANCE STATUS: 1 - Symptomatic but completely ambulatory  Blood pressure 133/71, pulse 97, temperature 98.1 F (36.7 C), temperature source Temporal, resp. rate 18, weight 157 lb 4.8 oz (71.4 kg), SpO2 97 %.  LABORATORY DATA: Lab Results  Component Value Date   WBC 7.4 04/06/2023   HGB 11.8 (L) 04/06/2023   HCT 34.1 (L) 04/06/2023   MCV 90.2 04/06/2023   PLT 131 (L) 04/06/2023      Chemistry      Component Value Date/Time   NA 128 (L) 03/13/2023 1624   K 3.6 03/13/2023 1624   CL 93 (L) 03/13/2023 1624   CO2 25 03/13/2023 1624   BUN 18 03/13/2023 1624   CREATININE 0.82 03/13/2023 1624   CREATININE 0.87 06/28/2022 1129      Component Value Date/Time   CALCIUM 9.7 03/13/2023 1624   ALKPHOS 86 01/18/2023 1452   AST 45 (H) 01/18/2023 1452   AST 41 06/28/2022 1129   ALT 52 (H) 01/18/2023 1452   ALT 44 06/28/2022 1129   BILITOT 2.5 (H) 01/18/2023 1452   BILITOT 1.7 (H) 06/28/2022 1129       RADIOGRAPHIC STUDIES: No results found.  ASSESSMENT AND PLAN: This is a very pleasant 72 years old white female with history of iron deficiency anemia secondary to gastrointestinal blood loss as well as history of ITP.   The patient was  treated in the past with iron infusion and she felt much better. She is currently on observation and she is feeling fine with no concerning complaints except for mild fatigue. Repeat CBC today showed hemoglobin of 11.8 and hematocrit of 34.1 with platelets count of 131,000.  Iron study and ferritin are still pending. I recommended for the patient to continue on observation for now with repeat blood work in 3 months.  If the pending iron studies showed significant deficiency, I will arrange for the patient to receive the iron infusion in the interval. She was advised to call immediately if she has any concerning symptoms. The patient voices understanding of current disease status and treatment options and is in agreement with the current care plan.  All questions were answered. The patient knows to call the clinic with any problems, questions or concerns. We can certainly see the  patient much sooner if necessary.  The total time spent in the appointment was 20 minutes.  Disclaimer: This note was dictated with voice recognition software. Similar sounding words can inadvertently be transcribed and may not be corrected upon review.

## 2023-04-07 ENCOUNTER — Telehealth: Payer: Self-pay | Admitting: Internal Medicine

## 2023-04-07 LAB — FERRITIN: Ferritin: 17 ng/mL (ref 11–307)

## 2023-04-07 NOTE — Telephone Encounter (Signed)
Lab results reviewed with pt. I told her a provider will review them .

## 2023-04-15 ENCOUNTER — Other Ambulatory Visit: Payer: Self-pay | Admitting: Family Medicine

## 2023-04-15 DIAGNOSIS — F339 Major depressive disorder, recurrent, unspecified: Secondary | ICD-10-CM

## 2023-04-18 ENCOUNTER — Telehealth: Payer: Self-pay | Admitting: Gastroenterology

## 2023-04-18 DIAGNOSIS — K7682 Hepatic encephalopathy: Secondary | ICD-10-CM

## 2023-04-18 DIAGNOSIS — K7460 Unspecified cirrhosis of liver: Secondary | ICD-10-CM

## 2023-04-18 DIAGNOSIS — E1165 Type 2 diabetes mellitus with hyperglycemia: Secondary | ICD-10-CM | POA: Diagnosis not present

## 2023-04-18 NOTE — Telephone Encounter (Signed)
PT has low sodium and wants to have an lab order put in to find out what is causing this. Please advise.

## 2023-04-18 NOTE — Telephone Encounter (Signed)
Returned call to patient. Pt states that she thinks that her sodium is low. I told pt that her sodium level is typically low based on previous labs. Pt does not have a PCP and would like to have labs checked. She reports increased dizziness and confusion in the last 2 weeks. Pt takes Lasix 20 mg daily and Aldactone 50 mg daily. Pt informed me that she is still taking Lactulose 6 tablespoons daily, only having 1 BM per day. Pt states that she is not able to tolerate more lactulose because it makes her gag. Please advise, thanks.

## 2023-04-19 ENCOUNTER — Other Ambulatory Visit (INDEPENDENT_AMBULATORY_CARE_PROVIDER_SITE_OTHER): Payer: Medicare PPO

## 2023-04-19 DIAGNOSIS — K7682 Hepatic encephalopathy: Secondary | ICD-10-CM

## 2023-04-19 DIAGNOSIS — K7581 Nonalcoholic steatohepatitis (NASH): Secondary | ICD-10-CM

## 2023-04-19 DIAGNOSIS — K7460 Unspecified cirrhosis of liver: Secondary | ICD-10-CM

## 2023-04-19 LAB — COMPREHENSIVE METABOLIC PANEL
ALT: 50 U/L — ABNORMAL HIGH (ref 0–35)
AST: 44 U/L — ABNORMAL HIGH (ref 0–37)
Albumin: 3.4 g/dL — ABNORMAL LOW (ref 3.5–5.2)
Alkaline Phosphatase: 80 U/L (ref 39–117)
BUN: 31 mg/dL — ABNORMAL HIGH (ref 6–23)
CO2: 25 mEq/L (ref 19–32)
Calcium: 9.7 mg/dL (ref 8.4–10.5)
Chloride: 95 mEq/L — ABNORMAL LOW (ref 96–112)
Creatinine, Ser: 1.23 mg/dL — ABNORMAL HIGH (ref 0.40–1.20)
GFR: 43.55 mL/min — ABNORMAL LOW (ref >60.00)
Glucose, Bld: 147 mg/dL — ABNORMAL HIGH (ref 70–99)
Potassium: 4 mEq/L (ref 3.5–5.1)
Sodium: 132 mEq/L — ABNORMAL LOW (ref 135–145)
Total Bilirubin: 2.9 mg/dL — ABNORMAL HIGH (ref 0.2–1.2)
Total Protein: 6.9 g/dL (ref 6.0–8.3)

## 2023-04-19 LAB — AMMONIA: Ammonia: 121 umol/L — ABNORMAL HIGH (ref 11–35)

## 2023-04-19 NOTE — Telephone Encounter (Signed)
Called and spoke with pt. She confirmed that she is taking Rifaximin 550 mg BID. Pt will stop by for labs today. Regarding PCP, pt states that her provider is on medical leave. I told pt that since she is already established with [facility] she should be able to just call and set up an appt if she needs to. Pt verbalized understanding and had no concerns at the end of the call.  Lab orders in epic.

## 2023-04-21 ENCOUNTER — Telehealth: Payer: Self-pay | Admitting: Gastroenterology

## 2023-04-21 DIAGNOSIS — K7581 Nonalcoholic steatohepatitis (NASH): Secondary | ICD-10-CM

## 2023-04-21 DIAGNOSIS — K7682 Hepatic encephalopathy: Secondary | ICD-10-CM

## 2023-04-21 NOTE — Telephone Encounter (Signed)
Returned call to patient. I informed pt that we sent over her labs to [facility] on 04/19/23. I informed her that they have a process for scheduling and should contact her within the next 2 weeks. I have confirmed the new doses of diuretics Lasix 10 mg daily and Aldactone 25 mg daily. Patient will stop by our office for repeat labs on Monday, no appt needed. Patient has also increased the lactulose as recommended. Pt had no concerns at the end of the call.

## 2023-04-21 NOTE — Telephone Encounter (Signed)
Patient called stated she was referred to Dr. Wyline Mood office. States she has tried to call a few times but could not get scheduled and was told she would receive a call back but has not heard anything back yet. She is also requesting a call back regarding labs. Please advise, thank you.

## 2023-04-24 ENCOUNTER — Other Ambulatory Visit (INDEPENDENT_AMBULATORY_CARE_PROVIDER_SITE_OTHER): Payer: Medicare PPO

## 2023-04-24 ENCOUNTER — Telehealth: Payer: Self-pay

## 2023-04-24 DIAGNOSIS — K7460 Unspecified cirrhosis of liver: Secondary | ICD-10-CM

## 2023-04-24 DIAGNOSIS — K7581 Nonalcoholic steatohepatitis (NASH): Secondary | ICD-10-CM | POA: Diagnosis not present

## 2023-04-24 DIAGNOSIS — K7682 Hepatic encephalopathy: Secondary | ICD-10-CM | POA: Diagnosis not present

## 2023-04-24 LAB — BASIC METABOLIC PANEL
BUN: 20 mg/dL (ref 6–23)
CO2: 24 mEq/L (ref 19–32)
Calcium: 9.6 mg/dL (ref 8.4–10.5)
Chloride: 93 mEq/L — ABNORMAL LOW (ref 96–112)
Creatinine, Ser: 1.02 mg/dL (ref 0.40–1.20)
GFR: 54.51 mL/min — ABNORMAL LOW (ref >60.00)
Glucose, Bld: 235 mg/dL — ABNORMAL HIGH (ref 70–99)
Potassium: 4.8 mEq/L (ref 3.5–5.1)
Sodium: 127 mEq/L — ABNORMAL LOW (ref 135–145)

## 2023-04-24 NOTE — Telephone Encounter (Signed)
Spoke with patient to remind her that she is due for repeat labs at this time. No appointment is necessary. Patient is aware that she can stop by the lab in the basement at her convenience before 5 pm today. Pt verbalized understanding and had no concerns at the end of the call.

## 2023-04-24 NOTE — Telephone Encounter (Signed)
-----   Message from Missy Sabins, RN sent at 04/19/2023  4:52 PM EDT ----- Regarding: BMET today BMET due - order in epic

## 2023-05-03 ENCOUNTER — Other Ambulatory Visit (INDEPENDENT_AMBULATORY_CARE_PROVIDER_SITE_OTHER): Payer: Medicare PPO

## 2023-05-03 ENCOUNTER — Encounter: Payer: Self-pay | Admitting: Gastroenterology

## 2023-05-03 ENCOUNTER — Ambulatory Visit: Payer: Medicare PPO | Admitting: Gastroenterology

## 2023-05-03 VITALS — BP 110/70 | HR 81 | Ht 63.0 in | Wt 169.0 lb

## 2023-05-03 DIAGNOSIS — K219 Gastro-esophageal reflux disease without esophagitis: Secondary | ICD-10-CM

## 2023-05-03 DIAGNOSIS — K7460 Unspecified cirrhosis of liver: Secondary | ICD-10-CM

## 2023-05-03 DIAGNOSIS — K7581 Nonalcoholic steatohepatitis (NASH): Secondary | ICD-10-CM | POA: Diagnosis not present

## 2023-05-03 DIAGNOSIS — K641 Second degree hemorrhoids: Secondary | ICD-10-CM

## 2023-05-03 LAB — AMMONIA: Ammonia: 55 umol/L — ABNORMAL HIGH (ref 11–35)

## 2023-05-03 NOTE — Progress Notes (Signed)
Chief Complaint:    Symptomatic Internal Hemorrhoids; Hemorrhoid Band Ligation  GI History: 1.  History of adenomatous polyps in her colon.  Colonoscopy 2006 Dr. Barnett Abu found no polyps.  Colonoscopy Dr. Christella Hartigan December 2016 found 7 subcentimeter polyps.  These were all adenomatous polyps on pathology.  Also documented left-sided diverticulosis.  Dr. Christella Hartigan recommended she have a repeat colonoscopy at 3-year interval.   Colonoscopy January 2020 four polyps were removed from her colon, all were adenomatous on pathology.  She was recommended to have repeat colonoscopy at 3-year interval.  Colonoscopy 03/2022 2 subcentimeter adenomas.  A 1 cm ulcer in the cecum was noted this did not appear malignant.  Biopsies showed no neoplasm, suspect from meloxicam she was taken for back pains.  Previous tattoo of colon area was normal. Recommended 5 year repeat.    2. Cirrhosis, likely from longstanding fatty liver disease, out-of-control diabetes.  Diagnosed 2019. - Lab workup 2019: Hep C Ab negative, Hep B Surface Ag negative, hepatitis B surface antibody negative, hep B Core Igm negative, Hep A IgM negative. Ferritin normal, HIV negative, ANA negative, antimitochondrial antibody negative, hepatitis a antibody total negative; slightly low platelets, transaminases slightly elevated.  No edema. - [place] 07/2018: slightly nodular, suspicious for cirrhosis. - [place] 12/2019 cirrhosis without mass lesions. - Ultrasound 08/2020 showed cirrhosis without focal mass lesions. Gallbladder thick but without Murphy sign. - Ultrasound May 2022 showed cirrhosis, trace perihepatic ascites, no focal lesions in the liver. - [place] 12/2021 nodular liver, no masses.  - EGD 12/2018 small distal esophagus varices, mild portal gastropathy, no gastric varices.  She was started on nadolol (eventually at 40mg  daily). - EGD 03/2022 small esophagus varices, mild portal gastropathy.  Biopsies of minimal gastritis showed no H. pylori - AFP 11/2021 normal -  MELD 12 (11/2021 labs) - Hepatitis A, B vaccination series started 11/2018 -11/2021: Mild encephalopathy, memory disturbance, slight tremors, ammonia 65. Started lactulose which might have raised her blood sugars a bit. -07/29/2022 - 08/01/2022: Hospital admission for hyponatremia 2/2 hypovolemia.  NA 120 on admission, improved to 130s by DC.  Discharged home with salt tablets to hold diuretics (was actually diuretics prior to hospitalization) -07/29/2022: CT A/P: Cirrhotic appearing liver, no HCC.  Evidence of portal hypertension (varices, splenorenal shunt, small volume ascites, mild splenomegaly), multiple cystic lesions in the uncinate process of the pancreas measuring up to 4.1 cm favoring sidebranch IPMN's.  Patient was not found to be a candidate for EUS based on hyponatremia and recommended MRI repeat in 3 months - 08/04/22: GI follow-up with Hyacinth Meeker.  Was still taking salt tablets.  Not taking any diuretics. - 08/09/2022: NA 136, ammonia 57 - 08/15/2022: Patient called to states she is accumulating fluid. - 08/15/2022: Abdominal ultrasound: New small to moderate ascites, GB wall thickening related to ascites/hepatic dysfunction.  Cirrhosis without HCC.  Restart Aldactone 25 mg qod and Lasix 10 mg qod (to take on alternating days).  Placed referral to Nephrology for medication intolerant ascites - 04/19/2023: BUN/creatinine 31/1.23.  Decreased Lasix/Aldactone to 10 mg / 25 mg with good serologic response.  Ammonia was 121 despite lactulose/rifaximin; attributed to renal dysfunction.     Cirrhosis Evaluation: - Etiology: NASH - Complications: Hepatic encephalopathy, hyponatremia, ascites, diuretic intolerance, esophageal varices, portal hypertensive gastropathy - HCC screening: UTD - Variceal screening: EGD 03/2022: Small esophageal varices - Serologic evaluation: Negative/normal as above - Viral hepatitis vaccination: UTD - Flu vaccine: UTD - Liver biopsy: None - Medications: Lactulose,  metoprolol, Lasix 10  mg/day, Aldactone 25 mg/day.  Was previously prescribed rifaximin, but today reports she has not been taking this - MELD 3.0: 19 - Child Pugh score: B   3.  Symptomatic hemorrhoids Colonoscopy in 03/2022 with internal hemorrhoids.  Hemorrhoid symptoms include intermittent scant BRBPR, rectal itching, irritation.  Symptoms not fully responsive to adequate trial of topical therapy. - 03/13/2023: Banding of RP hemorrhoid  4.   GERD Predominantly nocturnal reflux symptoms.  Index symptoms of regurgitation, choking sensation, cough that wakes her from sleep.  No history of erosive esophagitis.  Increased Pepcid 20 mg to bid dosing in 03/2023.  HPI:     Patient is a 72 y.o. femalewith a history of symptomatic internal hemorrhoids presenting to the [facility] for follow-up and ongoing treatment. The patient presents with symptomatic grade 2 hemorrhoids, unresponsive to maximal medical therapy, requesting rubber band ligation of symptomatic hemorrhoidal disease.  Did well with the first hemorrhoid banding last month.  Does have some recent intermittent painless BRBPR over the last week or so, described as scant BRB on tissue paper.  Separately, was having breakthrough nocturnal reflux symptoms at the time of the last appointment.  Increased Pepcid from 20 mg QAM --> BID with control of nocturnal breakthrough symptoms.  Recent labs with AKI (BUN/creatinine 31/1.2).  Decreased Lasix to 10 mg and Aldactone to 25 mg daily with good serologic response (BUN/creatinine 20/1.02).  Has referral to see Dr. Marisue Humble at [facility].  No LE edema or ascites.  Sodium has remained about baseline at 127.  No change in medical or surgical history, medications, allergies, social history since last appointment with me.   Review of systems:     No chest pain, no SOB, no fevers, no urinary sx   Past Medical History:  Diagnosis Date   ALLERGIC RHINITIS 10/09/2007   Allergy     Arthritis    hips, spine   DIABETES MELLITUS, UNCONTROLLED 03/16/2009   FATIGUE 10/21/2008   FIBROMYALGIA 10/09/2007   GERD (gastroesophageal reflux disease)    diet  controlled, no meds   H/O vaginal hysterectomy    HYPERLIPIDEMIA 10/09/2007   diet controlled, no meds   HYPERTENSION 10/09/2007   Morton's neuroma    OBSTRUCTIVE SLEEP APNEA 10/09/2007   Palpitations    Plantar fasciitis    History - right   Sleep apnea    Uses CPAP   TRANSAMINASES, SERUM, ELEVATED 07/08/2009    Patient's surgical history, family medical history, social history, medications and allergies were all reviewed in Epic    Current Outpatient Medications  Medication Sig Dispense Refill   BD PEN NEEDLE NANO 2ND GEN 32G X 4 MM MISC USE AS DIRECTED 400 each 3   clobetasol cream (TEMOVATE) 0.05 % Apply 1 Application topically 2 (two) times daily as needed (yeast infection).     Continuous Blood Gluc Sensor (DEXCOM G7 SENSOR) MISC 1 Device by Does not apply route as directed. Every 10 days 9 each 3   escitalopram (LEXAPRO) 10 MG tablet TAKE 1 TABLET BY MOUTH EVERY DAY 90 tablet 0   furosemide (LASIX) 40 MG tablet Take 40 mg by mouth daily.     insulin glargine (LANTUS SOLOSTAR) 100 UNIT/ML Solostar Pen INJECT 30 UNITS INTO THE SKIN DAILY. 45 mL 0   insulin lispro (HUMALOG KWIKPEN) 200 UNIT/ML KwikPen USE A MAX OF 120 UNITS AS DIRECTED DAILY 30 mL 1   lactulose (CHRONULAC) 10 GM/15ML solution TAKE 6 TABLESPOONS DAILY 2430 mL 1   metFORMIN (GLUCOPHAGE-XR) 750  MG 24 hr tablet Take 1 tablet (750 mg total) by mouth 2 (two) times daily. 180 tablet 3   metoprolol tartrate (LOPRESSOR) 25 MG tablet Take 0.5 tablets (12.5 mg total) by mouth 2 (two) times daily. 90 tablet 3   omeprazole (PRILOSEC) 20 MG capsule Take 20 mg by mouth at bedtime.     rifaximin (XIFAXAN) 550 MG TABS tablet Take 1 tablet (550 mg total) by mouth 2 (two) times daily. 180 tablet 3   spironolactone (ALDACTONE) 50 MG tablet TAKE 1 TABLET BY MOUTH  EVERY DAY 90 tablet 1   zinc gluconate 50 MG tablet Take 1 tablet (50 mg total) by mouth daily. 30 tablet 3   No current facility-administered medications for this visit.    Physical Exam:     BP 110/70   Pulse 81   Ht 5\' 3"  (1.6 m)   Wt 169 lb (76.7 kg)   BMI 29.94 kg/m   GENERAL:  Pleasant female in NAD PSYCH: : Cooperative, normal affect NEURO: Alert and oriented x 3 Rectal exam: Sensation intact and preserved anal wink.  Grade 2 hemorrhoids noted in LL and RA position on exam.  No external anal fissures noted. Normal sphincter tone. No palpable mass. No blood on the exam glove. (Chaperone: Ailene Rud, CMA).   IMPRESSION and PLAN:    1) Symptomatic internal hemorrhoids: PROCEDURE NOTE: The patient presents with symptomatic grade 2 hemorrhoids, unresponsive to maximal medical therapy, requesting rubber band ligation of symptomatic hemorrhoidal disease.  All risks, benefits and alternative forms of therapy were described and informed consent was obtained.  In the Left Lateral Decubitus position, anoscopic examination revealed grade 2 hemorrhoids in the LL and RA position(s).  The anorectum was pre-medicated with RectiCare. The decision was made to band the LL internal hemorrhoid, and the [facility] was used to perform band ligation without complication.  Digital anorectal examination was then performed to assure proper positioning of the band, and to adjust the banded tissue as required.  The patient was discharged home without pain or other issues.  Dietary and behavioral recommendations were given and along with follow-up instructions.     The following adjunctive treatments were recommended:  -Resume high-fiber diet with fiber supplement (i.e. Citrucel or Benefiber) with goal for soft stools without straining to have a BM. -Resume adequate fluid intake.  The patient will return in 4 for follow-up and possible additional banding as required. No complications were  encountered and the patient tolerated the procedure well.      2) NASH cirrhosis 3) Ascites 4) Portal hypertensive gastropathy 5) Esophageal varices 6) Hepatic encephalopathy - Continue current diuretics - Continue rifaximin/lactulose - Repeat ammonia - Has referral in place to Dr. Marisue Humble at [facility]     7) GERD Good response to increasing H2 blocker to twice daily therapy.   Shellia Cleverly ,DO, FACG 05/03/2023, 11:36 AM

## 2023-05-03 NOTE — Patient Instructions (Addendum)
You have been scheduled for an appointment with Dr. Barron Alvine for 3rd hemorrhoid banding on  07/10/23 at 11:20 . Please arrive 10 minutes early for your appointment.   Your provider has requested that you go to the basement level for lab work before leaving today. Press "B" on the elevator. The lab is located at the first door on the left as you exit the elevator.  Continue Lactulose.   HEMORRHOID BANDING PROCEDURE    FOLLOW-UP CARE   The procedure you have had should have been relatively painless since the banding of the area involved does not have nerve endings and there is no pain sensation.  The rubber band cuts off the blood supply to the hemorrhoid and the band may fall off as soon as 48 hours after the banding (the band may occasionally be seen in the toilet bowl following a bowel movement). You may notice a temporary feeling of fullness in the rectum which should respond adequately to plain Tylenol or Motrin.  Following the banding, avoid strenuous exercise that evening and resume full activity the next day.  A sitz bath (soaking in a warm tub) or bidet is soothing, and can be useful for cleansing the area after bowel movements.     To avoid constipation, take two tablespoons of natural wheat bran, natural oat bran, flax, Benefiber or any over the counter fiber supplement and increase your water intake to 7-8 glasses daily.    Unless you have been prescribed anorectal medication, do not put anything inside your rectum for two weeks: No suppositories, enemas, fingers, etc.  Occasionally, you may have more bleeding than usual after the banding procedure.  This is often from the untreated hemorrhoids rather than the treated one.  Don't be concerned if there is a tablespoon or so of blood.  If there is more blood than this, lie flat with your bottom higher than your head and apply an ice pack to the area. If the bleeding does not stop within a half an hour or if you feel faint, call our  office at [phone] or go to the emergency room.  Problems are not common; however, if there is a substantial amount of bleeding, severe pain, chills, fever or difficulty passing urine (very rare) or other problems, you should call us at [phone] or report to the nearest emergency room.  Do not stay seated continuously for more than 2-3 hours for a day or two after the procedure.  Tighten your buttock muscles 10-15 times every two hours and take 10-15 deep breaths every 1-2 hours.  Do not spend more than a few minutes on the toilet if you cannot empty your bowel; instead re-visit the toilet at a later time.    _______________________________________________________  If your blood pressure at your visit was 140/90 or greater, please contact your primary care physician to follow up on this.  _______________________________________________________  If you are age 69 or older, your body mass index should be between 23-30. Your Body mass index is 29.94 kg/m. If this is out of the aforementioned range listed, please consider follow up with your Primary Care Provider.  If you are age 6 or younger, your body mass index should be between 19-25. Your Body mass index is 29.94 kg/m. If this is out of the aformentioned range listed, please consider follow up with your Primary Care Provider.   __________________________________________________________  The [place] [facility] providers would like to encourage you to use [facility] to communicate with providers  for non-urgent requests or questions.  Due to long hold times on the telephone, sending your provider a message by [facility] may be a faster and more efficient way to get a response.  Please allow 48 business hours for a response.  Please remember that this is for non-urgent requests.   Thank you for choosing me and  Gastroenterology.  Vito Cirigliano, D.O.

## 2023-05-04 ENCOUNTER — Telehealth: Payer: Self-pay | Admitting: Family Medicine

## 2023-05-04 NOTE — Telephone Encounter (Signed)
It's ok with me if ok with Dr. Caryl Never

## 2023-05-04 NOTE — Telephone Encounter (Signed)
Pt would like to transfer to dr Casimiro Needle

## 2023-05-05 DIAGNOSIS — E119 Type 2 diabetes mellitus without complications: Secondary | ICD-10-CM | POA: Diagnosis not present

## 2023-05-05 DIAGNOSIS — K7460 Unspecified cirrhosis of liver: Secondary | ICD-10-CM | POA: Diagnosis not present

## 2023-05-05 DIAGNOSIS — R188 Other ascites: Secondary | ICD-10-CM | POA: Diagnosis not present

## 2023-05-05 DIAGNOSIS — K7581 Nonalcoholic steatohepatitis (NASH): Secondary | ICD-10-CM | POA: Diagnosis not present

## 2023-05-05 DIAGNOSIS — E871 Hypo-osmolality and hyponatremia: Secondary | ICD-10-CM | POA: Diagnosis not present

## 2023-05-05 LAB — BASIC METABOLIC PANEL: EGFR: 57

## 2023-05-18 NOTE — Telephone Encounter (Signed)
Rerouted to dr Caryl Never

## 2023-05-23 NOTE — Telephone Encounter (Signed)
Pt has been sch for 06/27/2023

## 2023-06-09 ENCOUNTER — Telehealth: Payer: Self-pay | Admitting: Medical Oncology

## 2023-06-09 NOTE — Telephone Encounter (Signed)
Both forearms are beet red x 5 days. 'It came up out of no where and it is puzzling". R arm itches. Denies blisters , weeping, signs of allergic reaction. I recommended she f/u with her PCP or go to Urgent care.   "I don't think the ED is necessary". She is going to call her Primary care practice.

## 2023-06-12 ENCOUNTER — Encounter: Payer: Self-pay | Admitting: Internal Medicine

## 2023-06-12 ENCOUNTER — Ambulatory Visit: Payer: Medicare PPO | Admitting: Internal Medicine

## 2023-06-12 VITALS — BP 122/70 | HR 94 | Temp 98.4°F | Wt 149.4 lb

## 2023-06-12 DIAGNOSIS — K7469 Other cirrhosis of liver: Secondary | ICD-10-CM | POA: Diagnosis not present

## 2023-06-12 DIAGNOSIS — D692 Other nonthrombocytopenic purpura: Secondary | ICD-10-CM | POA: Diagnosis not present

## 2023-06-12 LAB — APTT: aPTT: 31.9 s (ref 25.4–36.8)

## 2023-06-12 LAB — PROTIME-INR
INR: 1.3 ratio — ABNORMAL HIGH (ref 0.8–1.0)
Prothrombin Time: 13.3 s — ABNORMAL HIGH (ref 9.6–13.1)

## 2023-06-12 NOTE — Progress Notes (Signed)
Established Patient Office Visit     CC/Reason for Visit: "Rash over bilateral forearms"  HPI: Dana Dyer is a 72 y.o. female who is coming in today for the above mentioned reasons.  On July 4 she had the sudden appearance of an extensive ecchymotic lesion over dorsal aspect of bilateral forearms.  No lesions/trauma that she can recall.  No excessive sun exposure.  She does not take any anticoagulation including aspirin.  She has a history of hepatic cirrhosis and has had hepatic encephalopathy in the past.  She is on lactulose.   Past Medical/Surgical History: Past Medical History:  Diagnosis Date   ALLERGIC RHINITIS 10/09/2007   Allergy    Arthritis    hips, spine   DIABETES MELLITUS, UNCONTROLLED 03/16/2009   FATIGUE 10/21/2008   FIBROMYALGIA 10/09/2007   GERD (gastroesophageal reflux disease)    diet  controlled, no meds   H/O vaginal hysterectomy    HYPERLIPIDEMIA 10/09/2007   diet controlled, no meds   HYPERTENSION 10/09/2007   Morton's neuroma    OBSTRUCTIVE SLEEP APNEA 10/09/2007   Palpitations    Plantar fasciitis    History - right   Sleep apnea    Uses CPAP   TRANSAMINASES, SERUM, ELEVATED 07/08/2009    Past Surgical History:  Procedure Laterality Date   ABDOMINAL HYSTERECTOMY  1979   prolapsed uterus   BASAL CELL CARCINOMA EXCISION     BREAST BIOPSY Left 2012   left 2012 stereo   BREAST REDUCTION SURGERY Bilateral 1992   COLONOSCOPY  2006   [place]   mamoplasty     reduction   REDUCTION MAMMAPLASTY Bilateral 1992   TONSILLECTOMY  1959   TUBAL LIGATION  1976    Social History:  reports that she has never smoked. She has never used smokeless tobacco. She reports that she does not drink alcohol and does not use drugs.  Allergies: Allergies  Allergen Reactions   Amoxicillin-Pot Clavulanate     Severe diarrhea   Codeine Sulfate     REACTION: "hyper"   Diflucan [Fluconazole] Hives   Epinephrine Other (See Comments)    Racing heart  rate   Erythromycin Base Diarrhea and Other (See Comments)    cramps   Invokana [Canagliflozin] Other (See Comments)    Recurrent yeast infections   Irbesartan-Hydrochlorothiazide Other (See Comments)    Unknown reaction   Victoza [Liraglutide] Nausea Only   Latex Rash    Long term use   Ozempic (0.25 Or 0.5 Mg-Dose) [Semaglutide(0.25 Or 0.5mg -Dos)] Nausea Only    Family History:  Family History  Problem Relation Age of Onset   Diabetes Mother    Arthritis Mother    Breast cancer Mother 2   Alcohol abuse Father    Arthritis Father    Hypertension Maternal Grandfather    Miscarriages / Stillbirths Paternal Grandfather    Breast cancer Maternal Aunt        diagnosed in her 58's   Colon polyps Neg Hx    Colon cancer Neg Hx    Esophageal cancer Neg Hx    Rectal cancer Neg Hx    Stomach cancer Neg Hx      Current Outpatient Medications:    BD PEN NEEDLE NANO 2ND GEN 32G X 4 MM MISC, USE AS DIRECTED, Disp: 400 each, Rfl: 3   clobetasol cream (TEMOVATE) 0.05 %, Apply 1 Application topically 2 (two) times daily as needed (yeast infection)., Disp: , Rfl:    Continuous Blood Gluc Sensor (  DEXCOM G7 SENSOR) MISC, 1 Device by Does not apply route as directed. Every 10 days, Disp: 9 each, Rfl: 3   escitalopram (LEXAPRO) 10 MG tablet, TAKE 1 TABLET BY MOUTH EVERY DAY, Disp: 90 tablet, Rfl: 0   furosemide (LASIX) 40 MG tablet, Take 40 mg by mouth daily., Disp: , Rfl:    insulin glargine (LANTUS SOLOSTAR) 100 UNIT/ML Solostar Pen, INJECT 30 UNITS INTO THE SKIN DAILY., Disp: 45 mL, Rfl: 0   insulin lispro (HUMALOG KWIKPEN) 200 UNIT/ML KwikPen, USE A MAX OF 120 UNITS AS DIRECTED DAILY, Disp: 30 mL, Rfl: 1   lactulose (CHRONULAC) 10 GM/15ML solution, TAKE 6 TABLESPOONS DAILY, Disp: 2430 mL, Rfl: 1   metFORMIN (GLUCOPHAGE-XR) 750 MG 24 hr tablet, Take 1 tablet (750 mg total) by mouth 2 (two) times daily., Disp: 180 tablet, Rfl: 3   metoprolol tartrate (LOPRESSOR) 25 MG tablet, Take 0.5 tablets  (12.5 mg total) by mouth 2 (two) times daily., Disp: 90 tablet, Rfl: 3   omeprazole (PRILOSEC) 20 MG capsule, Take 20 mg by mouth at bedtime., Disp: , Rfl:    rifaximin (XIFAXAN) 550 MG TABS tablet, Take 1 tablet (550 mg total) by mouth 2 (two) times daily., Disp: 180 tablet, Rfl: 3   spironolactone (ALDACTONE) 50 MG tablet, TAKE 1 TABLET BY MOUTH EVERY DAY, Disp: 90 tablet, Rfl: 1   zinc gluconate 50 MG tablet, Take 1 tablet (50 mg total) by mouth daily., Disp: 30 tablet, Rfl: 3  Review of Systems:  Negative unless indicated in HPI.   Physical Exam: Vitals:   06/12/23 1030 06/12/23 1033  BP: (!) 140/80 122/70  Pulse: 94   Temp: 98.4 F (36.9 C)   TempSrc: Oral   SpO2: 98%   Weight: 149 lb 6.4 oz (67.8 kg)     Body mass index is 26.47 kg/m.   Physical Exam Skin:    Comments: Purpura/ecchymosis over bilateral forearms      Impression and Plan:  Senile purpura (HCC) -     Protime-INR; Future -     APTT; Future  Other cirrhosis of liver (HCC)  -Purpura/ecchymosis is extensive encompassing bilateral dorsal aspects of the forearm extending up into the upper arm, no trauma that she can recall. -She is not taking any anticoagulants, including aspirin. -With her history of cirrhosis of the liver I wonder about her PT/INR.  Will check today. -Advised to follow-up with GI.   Time spent:22 minutes reviewing chart, interviewing and examining patient and formulating plan of care.     Chaya Jan, MD [facility] at [facility]

## 2023-06-14 ENCOUNTER — Encounter: Payer: Self-pay | Admitting: Internal Medicine

## 2023-06-14 ENCOUNTER — Ambulatory Visit: Payer: Medicare PPO | Admitting: Internal Medicine

## 2023-06-14 VITALS — BP 124/72 | HR 88 | Ht 63.0 in | Wt 150.6 lb

## 2023-06-14 DIAGNOSIS — Z794 Long term (current) use of insulin: Secondary | ICD-10-CM | POA: Diagnosis not present

## 2023-06-14 DIAGNOSIS — E1142 Type 2 diabetes mellitus with diabetic polyneuropathy: Secondary | ICD-10-CM | POA: Diagnosis not present

## 2023-06-14 LAB — POCT GLYCOSYLATED HEMOGLOBIN (HGB A1C): Hemoglobin A1C: 5.8 % — AB (ref 4.0–5.6)

## 2023-06-14 NOTE — Progress Notes (Unsigned)
Name: Dana Dyer  Age/ Sex: 72 y.o., female   MRN/ DOB: [id number], Aug 07, 1951     PCP: Kristian Covey, MD   Reason for Endocrinology Evaluation: Type 2 Diabetes Mellitus  Initial Endocrine Consultative Visit: 10/23/18    PATIENT IDENTIFIER: Dana Dyer is a 72 y.o. female with a past medical history of HTN, T2DM, Cirrhosis (Dx 2019), Fibromyalgia and dyslipidemia . The patient has followed with Endocrinology clinic since 10/23/18 for consultative assistance with management of her diabetes.  DIABETIC HISTORY:  Dana Dyer was diagnosed with T2DM in 2005. She used to be on SU but does not recall intolerance. She is intolerant to victoza - nauea and invokana- yeast infection. She was started on insulin therapy in 2009. Her hemoglobin A1c has ranged from 7.8 % in 2012, peaking at 10.9% in 2018.   Ozempic was stopped in 06/2019 due to persistent nausea.   Switched pravastatin to crestor 09/2019  SUBJECTIVE:   During the last visit (01/13/2023): A1c was 6.0 %    Today (06/14/2023): Dana Dyer is here for a follow up on diabetes management.  She checks her blood sugars multiple times a day through CGM.  The patient has not had hypoglycemic episodes since the last clinic visit per  She was seen by oncology for iron deficiency anemia secondary to GI bleed and ITP 04/2023 Continues to follow up with Gi for cirrhosis  She continues to be on lactulose which causes diarrhea  She is having ecchymotic lesions on the upper extremities      HOME DIABETES REGIMEN:  Lantus 28 units daily Humalog 14 units with Breakfast, 16 units with lunch and 16 units with supper Metformin 750 mg BID  CF: Humalog (BG-130/25)    CONTINUOUS GLUCOSE MONITORING RECORD INTERPRETATION    Dates of Recording:6/27-7/09/2023 Sensor description:dexcom  Results statistics:   CGM use % of time 100  Average and SD 158/43  Time in range 70%  % Time Above 180 26  % Time above 250 3  % Time  Below target <1      Glycemic patterns summary: Bg's optimal overnight, with hyperglycemia noted during the day   Hyperglycemic episodes  post breakfast  Hypoglycemic episodes occurred at night  Overnight periods: optimal    DIABETIC COMPLICATIONS: Microvascular complications:  Neuroathy Denies: retinopathy, nephropathy Last eye exam: Completed 09/06/2021     Macrovascular complications:   Denies: CAD, PVD, CVA    HISTORY:  Past Medical History:  Past Medical History:  Diagnosis Date   ALLERGIC RHINITIS 10/09/2007   Allergy    Arthritis    hips, spine   DIABETES MELLITUS, UNCONTROLLED 03/16/2009   FATIGUE 10/21/2008   FIBROMYALGIA 10/09/2007   GERD (gastroesophageal reflux disease)    diet  controlled, no meds   H/O vaginal hysterectomy    HYPERLIPIDEMIA 10/09/2007   diet controlled, no meds   HYPERTENSION 10/09/2007   Morton's neuroma    OBSTRUCTIVE SLEEP APNEA 10/09/2007   Palpitations    Plantar fasciitis    History - right   Sleep apnea    Uses CPAP   TRANSAMINASES, SERUM, ELEVATED 07/08/2009   Past Surgical History:  Past Surgical History:  Procedure Laterality Date   ABDOMINAL HYSTERECTOMY  1979   prolapsed uterus   BASAL CELL CARCINOMA EXCISION     BREAST BIOPSY Left 2012   left 2012 stereo   BREAST REDUCTION SURGERY Bilateral 1992   COLONOSCOPY  2006   [facility]  reduction   REDUCTION MAMMAPLASTY Bilateral 1992   TONSILLECTOMY  1959   TUBAL LIGATION  1976   Social History:  reports that she has never smoked. She has never used smokeless tobacco. She reports that she does not drink alcohol and does not use drugs. Family History:  Family History  Problem Relation Age of Onset   Diabetes Mother    Arthritis Mother    Breast cancer Mother 62   Alcohol abuse Father    Arthritis Father    Hypertension Maternal Grandfather    Miscarriages / Stillbirths Paternal Grandfather    Breast cancer Maternal Aunt        diagnosed  in her 39's   Colon polyps Neg Hx    Colon cancer Neg Hx    Esophageal cancer Neg Hx    Rectal cancer Neg Hx    Stomach cancer Neg Hx      HOME MEDICATIONS: Allergies as of 06/14/2023       Reactions   Amoxicillin-pot Clavulanate    Severe diarrhea   Codeine Sulfate    REACTION: "hyper"   Diflucan [fluconazole] Hives   Epinephrine Other (See Comments)   Racing heart rate   Erythromycin Base Diarrhea, Other (See Comments)   cramps   Invokana [canagliflozin] Other (See Comments)   Recurrent yeast infections   Irbesartan-hydrochlorothiazide Other (See Comments)   Unknown reaction   Victoza [liraglutide] Nausea Only   Latex Rash   Long term use   Ozempic (0.25 Or 0.5 Mg-dose) [semaglutide(0.25 Or 0.5mg -dos)] Nausea Only        Medication List        Accurate as of June 14, 2023  3:02 PM. If you have any questions, ask your nurse or doctor.          BD Pen Needle Nano 2nd Gen 32G X 4 MM Misc Generic drug: Insulin Pen Needle USE AS DIRECTED   clobetasol cream 0.05 % Commonly known as: TEMOVATE Apply 1 Application topically 2 (two) times daily as needed (yeast infection).   Dexcom G7 Sensor Misc 1 Device by Does not apply route as directed. Every 10 days   escitalopram 10 MG tablet Commonly known as: LEXAPRO TAKE 1 TABLET BY MOUTH EVERY DAY   furosemide 40 MG tablet Commonly known as: LASIX Take 40 mg by mouth daily.   HumaLOG KwikPen 200 UNIT/ML KwikPen Generic drug: insulin lispro USE A MAX OF 120 UNITS AS DIRECTED DAILY   lactulose 10 GM/15ML solution Commonly known as: CHRONULAC TAKE 6 TABLESPOONS DAILY   Lantus SoloStar 100 UNIT/ML Solostar Pen Generic drug: insulin glargine INJECT 30 UNITS INTO THE SKIN DAILY.   metFORMIN 750 MG 24 hr tablet Commonly known as: GLUCOPHAGE-XR Take 1 tablet (750 mg total) by mouth 2 (two) times daily.   metoprolol tartrate 25 MG tablet Commonly known as: LOPRESSOR Take 0.5 tablets (12.5 mg total) by mouth 2  (two) times daily.   omeprazole 20 MG capsule Commonly known as: PRILOSEC Take 20 mg by mouth at bedtime.   rifaximin 550 MG Tabs tablet Commonly known as: XIFAXAN Take 1 tablet (550 mg total) by mouth 2 (two) times daily.   spironolactone 50 MG tablet Commonly known as: ALDACTONE TAKE 1 TABLET BY MOUTH EVERY DAY   zinc gluconate 50 MG tablet Take 1 tablet (50 mg total) by mouth daily.         OBJECTIVE:   Vital Signs: BP 124/72 (BP Location: Right Arm, Patient Position: Sitting, Cuff Size: Large)  Pulse 88   Ht 5\' 3"  (1.6 m)   Wt 150 lb 9.6 oz (68.3 kg)   SpO2 96%   BMI 26.68 kg/m   Wt Readings from Last 3 Encounters:  06/14/23 150 lb 9.6 oz (68.3 kg)  06/12/23 149 lb 6.4 oz (67.8 kg)  05/03/23 169 lb (76.7 kg)     Exam: General: Pt appears well and is in NAD  Lungs: Clear with good BS bilat   Heart: RRR   Extremities: No  pretibial edema.  Neuro: MS is good with appropriate affect, pt is alert and Ox3   DM Foot 01/13/2023 The skin of the feet is intact without sores or ulcerations. The pedal pulses are 2+ on right and 2+ on left. The sensation is intact   to a screening 5.07, 10 gram monofilament on the left      DATA REVIEWED:  Lab Results  Component Value Date   HGBA1C 6.0 (A) 01/13/2023   HGBA1C 6.8 (A) 06/24/2022   HGBA1C 6.7 (A) 03/17/2022    Latest Reference Range & Units 04/24/23 12:23 05/03/23 11:57  Sodium 135 - 145 mEq/L 127 (L)   Potassium 3.5 - 5.1 mEq/L 4.8   Chloride 96 - 112 mEq/L 93 (L)   CO2 19 - 32 mEq/L 24   Glucose 70 - 99 mg/dL 454 (H)   BUN 6 - 23 mg/dL 20   Creatinine 0.98 - 1.20 mg/dL 1.19   Calcium 8.4 - 14.7 mg/dL 9.6   Ammonia 11 - 35 umol/L  55 (H)  (L): Data is abnormally low (H): Data is abnormally high    05/05/23 11:55  eGFR 57.0 (E)  (E): External lab result   ASSESSMENT / PLAN / RECOMMENDATIONS:   1) Type 2 Diabetes Mellitus, OPtimally controlled, With neuropathic complications - Most recent A1c of  5.8 %. Goal A1c < 7.5 %.   -A1c is skewed at 5.8%, GMI.  Dexcom is 7.1% - She is intolerant to GLP-1 agonists due to GI side effects and SGLT-2 inhibitors due to recurrent yeast infection  -Patient has been noted with hypoglycemia overnight, will decrease Lantus as below -She also has been noted with postprandial hyperglycemia for breakfast, will change Humalog with breakfast as below   MEDICATIONS: -Decrease Lantus 26 units daily -Change Humalog 16 units with breakfast, 16 units with Lunch and 16 units with supper  -Continue  metformin 750 XR mg BID  - CF : HUmalog ( BG -130/25) TID   EDUCATION / INSTRUCTIONS: BG monitoring instructions: Patient is instructed to check her blood sugars 4 times a day, before meals and bedtime. Call [place] [facility] if: BG persistently < 70  I reviewed the Rule of 15 for the treatment of hypoglycemia in detail with the patient. Literature supplied.    F/U in 6 months   Signed electronically by: Lyndle Herrlich, MD  [facility] Endocrinology  [facility] [address] [place], [place] [address] Phone: [phone] FAX: [phone]   CC: Kristian Covey, MD [address] [place] [address] Phone: [phone]  Fax: [phone]  Return to Endocrinology clinic as below: Future Appointments  Date Time Provider Department Center  06/27/2023 11:00 AM Karie Georges, MD [facility]  07/04/2023 11:00 AM [facility] None  07/04/2023 11:30 AM Si Gaul, MD [facility] None  07/10/2023 11:20 AM Shellia Cleverly, DO [facility]  08/15/2023  1:00 PM [facility] WE[facility]NESS VISIT [facility]

## 2023-06-14 NOTE — Patient Instructions (Addendum)
-   Decrease Lantus 26 units daily  - Change  Humalog 16 units with Breakfast,  16 units Lunch and 16 units supper - Continue Metformin 750 mg, 1 tablet twice daily  - Humalog correctional insulin: ADD extra units on insulin to your meal-time Humalog  dose if your blood sugars are higher than 155. Use the scale below to help guide you:   Blood sugar before meal Number of units to inject  Less than 155 0 unit  156 - 180 1 units  181 - 205 2 units  206 - 230 3 units  231 - 255 4 units  256 - 280 5 units  281 - 305 6 units  306 - 330 7 units  331 - 355 8 units  356 - 380 9 units   381 - 405 10 units      HOW TO TREAT LOW BLOOD SUGARS (Blood sugar LESS THAN 70 MG/DL) Please follow the RULE OF 15 for the treatment of hypoglycemia treatment (when your (blood sugars are less than 70 mg/dL)   STEP 1: Take 15 grams of carbohydrates when your blood sugar is low, which includes:  3-4 GLUCOSE TABS  OR 3-4 OZ OF JUICE OR REGULAR SODA OR ONE TUBE OF GLUCOSE GEL    STEP 2: RECHECK blood sugar in 15 MINUTES STEP 3: If your blood sugar is still low at the 15 minute recheck --> then, go back to STEP 1 and treat AGAIN with another 15 grams of carbohydrates

## 2023-06-15 ENCOUNTER — Encounter: Payer: Self-pay | Admitting: Internal Medicine

## 2023-06-15 MED ORDER — BD PEN NEEDLE NANO 2ND GEN 32G X 4 MM MISC: 1.0000 | Freq: Four times a day (QID) | 3 refills | Status: DC

## 2023-06-15 MED ORDER — LANTUS SOLOSTAR 100 UNIT/ML ~~LOC~~ SOPN: 26.0000 [IU] | PEN_INJECTOR | Freq: Every day | SUBCUTANEOUS | 3 refills | Status: DC

## 2023-06-15 MED ORDER — HUMALOG KWIKPEN 200 UNIT/ML ~~LOC~~ SOPN: PEN_INJECTOR | SUBCUTANEOUS | 3 refills | Status: DC

## 2023-06-15 MED ORDER — METFORMIN HCL ER 750 MG PO TB24: 750.0000 mg | ORAL_TABLET | Freq: Two times a day (BID) | ORAL | 3 refills | Status: DC

## 2023-06-25 ENCOUNTER — Other Ambulatory Visit: Payer: Self-pay | Admitting: Internal Medicine

## 2023-06-26 ENCOUNTER — Telehealth: Payer: Self-pay | Admitting: Internal Medicine

## 2023-06-26 NOTE — Telephone Encounter (Signed)
MEDICATION: Lantus SoloStar insulin glargine (LANTUS SOLOSTAR) 100 UNIT/ML Solostar Pen  PHARMACY:    [id number] - [place],  - [address] AT [address] (Ph: [phone])    HAS THE PATIENT CONTACTED THEIR PHARMACY?  Yes  IS THIS A 90 DAY SUPPLY : Yes  IS PATIENT OUT OF MEDICATION: No  IF NOT; HOW MUCH IS LEFT: Will be out tomorrow (Tuesday 06/27/2023)  LAST APPOINTMENT DATE: @7 /09/2023  NEXT APPOINTMENT DATE:@1 /14/2025  DO WE HAVE YOUR PERMISSION TO LEAVE A DETAILED MESSAGE?: Yes  OTHER COMMENTS: Patient's spouse, Rayna Sexton, states that [facility] told [facility] that a [facility]ill was denied on this prescription and to call the office.   **Let patient know to contact pharmacy at the end of the day to make sure medication is ready. **  ** Please notify patient to allow 48-72 hours to process**  **Encourage patient to contact the pharmacy for refills or they can request refills through [facility]**

## 2023-06-26 NOTE — Telephone Encounter (Signed)
Spoke with pharmacy and they have script on file and will get ready for patient

## 2023-06-27 ENCOUNTER — Telehealth: Payer: Self-pay | Admitting: *Deleted

## 2023-06-27 ENCOUNTER — Encounter: Payer: Self-pay | Admitting: Family Medicine

## 2023-06-27 ENCOUNTER — Ambulatory Visit: Payer: Medicare PPO | Admitting: Family Medicine

## 2023-06-27 VITALS — BP 108/58 | HR 103 | Temp 98.2°F | Ht 63.0 in | Wt 143.2 lb

## 2023-06-27 DIAGNOSIS — F339 Major depressive disorder, recurrent, unspecified: Secondary | ICD-10-CM

## 2023-06-27 DIAGNOSIS — R630 Anorexia: Secondary | ICD-10-CM

## 2023-06-27 MED ORDER — ESCITALOPRAM OXALATE 10 MG PO TABS
5.0000 mg | ORAL_TABLET | Freq: Every day | ORAL | Status: DC
Start: 2023-06-27 — End: 2023-07-13

## 2023-06-27 MED ORDER — MIRTAZAPINE 15 MG PO TBDP
ORAL_TABLET | ORAL | 0 refills | Status: DC
Start: 2023-06-27 — End: 2023-08-10

## 2023-06-27 NOTE — Patient Instructions (Addendum)
Decrease the lexapro to 5 mg (1/2 tablet) daily for 30 days then STOP  Add mirtazepine 1/2 tablet daily at bedtime for 1 week, then go up to 1 whole tablet daily at bedtime  Add protein shakes (Glucerna or other low sugar supplements) Docia Furl is also a good brand that is low sugar, Adkins brands shakes are also low sugar.   Cheese, beans, meat, eggs, fish etc are high in protein and low in sugar.  Goal is for 3 bowel movements per day

## 2023-06-27 NOTE — Telephone Encounter (Signed)
-----   Message from Winthrop S sent at 06/27/2023  1:16 PM EDT ----- Please abstract and route to provider

## 2023-06-27 NOTE — Progress Notes (Signed)
Established Patient Office Visit  Subjective   Patient ID: Dana Dyer, female    DOB: June 06, 1951  Age: 72 y.o. MRN: [id number]  Chief Complaint  Patient presents with   Establish Care    Patient is here for [facility] visit. Husband is present in the visit today. She was hospitalized in the fall due to low sodium. States she was in the hospital for about 3 days and then they released her at a sodium of 128. Husband repotrs that her sodium has never fully recovered. He reports a history of NASH, is being seen by the GI specialist for this and she is on Xifaxin and lactulose, husband states that she threw up 3 times this week already and he reduced her lactulose and the xifaxin to 1 tablet daily instead of 2 tablets. States that the vomiting has improved with the changes. He reports she continues to have at least 3 bowel movements per day on the lower dosage of medications.   Husband reports that she has deteriorated significantly over the last year or so. He is concerned that it might be from her medication or he was wondering if she was misdiagnosed. States that 1 year ago she was vibrant and energetic, and today he reports he has to help her dress, is able to feed herself and take herself to the bathroom, also able to shower herself. No other associated symptoms, she retains most memories, husband states she gets foggy in the afternoons mostly. Husband reports she has lost weight, down to 143 pounds. He says that her deterioration is mostly physical rather than mental. Pt reports her appetite overall has decreased significantly.  She has DM and her last A1C was 5.8, has a specialist for his and she wears a CGM so she does a good job with this.   I have reviewed all aspects of the patient's medical history including social, family, and surgical history. I reviewed her specialists notes and her medications.     Current Outpatient Medications  Medication Instructions   clobetasol cream (TEMOVATE)  0.05 % 1 Application, Topical, 2 times daily PRN   Continuous Blood Gluc Sensor (DEXCOM G7 SENSOR) MISC 1 Device, Does not apply, As directed, Every 10 days   escitalopram (LEXAPRO) 5 mg, Oral, Daily   furosemide (LASIX) 40 mg, Oral, Daily   insulin lispro (HUMALOG KWIKPEN) 200 UNIT/ML KwikPen Max daily 60 units   Insulin Pen Needle (BD PEN NEEDLE NANO 2ND GEN) 32G X 4 MM MISC 1 Device, Other, 4 times daily   lactulose (CHRONULAC) 10 GM/15ML solution TAKE 6 TABLESPOONS DAILY   Lantus SoloStar 26 Units, Subcutaneous, Daily   metFORMIN (GLUCOPHAGE-XR) 750 mg, Oral, 2 times daily   metoprolol tartrate (LOPRESSOR) 12.5 mg, Oral, 2 times daily   mirtazapine (REMERON SOL-TAB) 15 MG disintegrating tablet Start with 1/2 tablet daily at bedtime for 1 week, then increase to 1 tablet daily at bedtime.   omeprazole (PRILOSEC) 20 mg, Oral, Daily at bedtime   rifaximin (XIFAXAN) 550 mg, Oral, 2 times daily   spironolactone (ALDACTONE) 50 mg, Oral, Daily   zinc gluconate 50 mg, Oral, Daily    Patient Active Problem List   Diagnosis Date Noted   Encephalopathy, hepatic (HCC) 10/20/2022   DOE (dyspnea on exertion) 08/19/2022   NASH (nonalcoholic steatohepatitis) 08/19/2022   Dehydration with hyponatremia 07/29/2022   Iron deficiency anemia 07/06/2022   Iron deficiency anemia due to chronic blood loss 06/28/2022   Hyperkalemia 03/17/2022   Hyponatremia 03/17/2022  Degenerative disc disease, cervical 10/05/2021   Type 2 diabetes mellitus with diabetic polyneuropathy, with long-term current use of insulin (HCC) 04/29/2020   Dyslipidemia 09/09/2019   Type 2 diabetes mellitus with hyperglycemia, with long-term current use of insulin (HCC) 09/09/2019   Thrombocytopenia (HCC) 08/23/2018   Splenomegaly 08/23/2018   Cirrhosis (HCC) 08/23/2018   Depression, major, single episode, severe (HCC) 07/31/2017   Low serum vitamin B12 07/31/2017   Degenerative disc disease, lumbar 09/19/2016   Arthritis, midfoot  04/12/2016   Tarsal tunnel syndrome of right side 03/17/2016   Tibialis posterior tendon tear, nontraumatic 02/25/2016   Biceps tendinitis on left 07/14/2015   Tremor of left hand 07/14/2015   Mild carpal tunnel syndrome of left wrist 06/23/2014   Greater trochanteric bursitis of right hip 06/23/2014   Obesity (BMI 30-39.9) 09/05/2013   Asthmatic bronchitis 08/12/2013   TRANSAMINASES, SERUM, ELEVATED 07/08/2009   Diabetes type 2, uncontrolled 03/16/2009   Hypersomnolence 10/21/2008   Hyperlipemia 10/09/2007   Obstructive sleep apnea 10/09/2007   Essential hypertension 10/09/2007   ALLERGIC RHINITIS 10/09/2007   FIBROMYALGIA 10/09/2007      Review of Systems  Constitutional:  Positive for weight loss. Negative for chills, fever and malaise/fatigue.  Musculoskeletal:  Negative for myalgias.  Neurological:  Positive for weakness. Negative for dizziness.  Psychiatric/Behavioral:  Negative for depression.   All other systems reviewed and are negative.     Objective:     BP (!) 108/58 (BP Location: Left Arm, Patient Position: Sitting, Cuff Size: Normal)   Pulse (!) 103   Temp 98.2 F (36.8 C) (Oral)   Ht 5\' 3"  (1.6 m)   Wt 143 lb 3.2 oz (65 kg)   SpO2 97%   BMI 25.37 kg/m    Physical Exam Constitutional:      Appearance: Normal appearance. She is normal weight.  Eyes:     Conjunctiva/sclera: Conjunctivae normal.  Pulmonary:     Effort: Pulmonary effort is normal.  Musculoskeletal:     Right lower leg: No edema.     Left lower leg: No edema.  Skin:    General: Skin is warm and dry.  Neurological:     General: No focal deficit present.     Mental Status: She is alert and oriented to person, place, and time. Mental status is at baseline.  Psychiatric:        Mood and Affect: Mood normal.        Behavior: Behavior normal.        Thought Content: Thought content normal.        Judgment: Judgment normal.      No results found for any visits on 06/27/23.  Last  metabolic panel Lab Results  Component Value Date   GLUCOSE 235 (H) 04/24/2023   NA 127 (L) 04/24/2023   K 4.8 04/24/2023   CL 93 (L) 04/24/2023   CO2 24 04/24/2023   BUN 20 04/24/2023   CREATININE 1.02 04/24/2023   EGFR 57.0 05/05/2023   CALCIUM 9.6 04/24/2023   PHOS 3.8 05/10/2022   PROT 6.9 04/19/2023   ALBUMIN 3.4 (L) 04/19/2023   LABGLOB 3.2 06/28/2022   AGRATIO 1.0 06/28/2022   BILITOT 2.9 (H) 04/19/2023   ALKPHOS 80 04/19/2023   AST 44 (H) 04/19/2023   ALT 50 (H) 04/19/2023   ANIONGAP 10 10/15/2022      The 10-year ASCVD risk score (Arnett DK, et al., 2019) is: 21.8%    Assessment & Plan:  Decreased appetite -  Mirtazapine; Start with 1/2 tablet daily at bedtime for 1 week, then increase to 1 tablet daily at bedtime.  Dispense: 90 tablet; Refill: 0  Depression, recurrent (HCC) -     Escitalopram Oxalate; Take 0.5 tablets (5 mg total) by mouth daily.   We had a long >30 minute discussion about liver cirrhosis and all of the health changes/implications of this illness. She is currently around stage 2. I advised that increasing nutrition/protein and engaging in regular physical activity would help improve the deterioration. I advised her and her husband on protein shakes/foods, we also discussed her decreased appetite and I recommended weaning off the lexapro and replacing it with mirtazepine at night to help with the appetite issue. I will see them back in the office in 6 weeks to re-evaluate and weight the patient.   Return in about 6 weeks (around 08/08/2023) for follow up on nutrition/ meds.    Karie Georges, MD

## 2023-06-27 NOTE — Telephone Encounter (Signed)
Left a detailed message on the voicemail for the assistant of Dr Ovidio Kin patients to return a call with information if the patient has diabetic retinopathy or not.  I could not determine this from the handwritten office note.

## 2023-06-28 ENCOUNTER — Encounter: Payer: Self-pay | Admitting: Family Medicine

## 2023-06-28 NOTE — Telephone Encounter (Signed)
Coralee [facility] is calling the pt does not have diabetic retinopathy

## 2023-06-28 NOTE — Telephone Encounter (Signed)
Abstracted as below.

## 2023-07-01 ENCOUNTER — Emergency Department (HOSPITAL_COMMUNITY): Payer: Medicare PPO

## 2023-07-01 ENCOUNTER — Other Ambulatory Visit: Payer: Self-pay | Admitting: Internal Medicine

## 2023-07-01 ENCOUNTER — Encounter (HOSPITAL_COMMUNITY): Payer: Self-pay

## 2023-07-01 ENCOUNTER — Other Ambulatory Visit: Payer: Self-pay

## 2023-07-01 ENCOUNTER — Inpatient Hospital Stay (HOSPITAL_COMMUNITY)
Admission: EM | Admit: 2023-07-01 | Discharge: 2023-07-05 | DRG: 441 | Disposition: A | Discharge status: Home Health | Payer: Medicare PPO | Attending: Internal Medicine | Admitting: Internal Medicine

## 2023-07-01 DIAGNOSIS — N179 Acute kidney failure, unspecified: Secondary | ICD-10-CM | POA: Diagnosis present

## 2023-07-01 DIAGNOSIS — Y92008 Other place in unspecified non-institutional (private) residence as the place of occurrence of the external cause: Secondary | ICD-10-CM | POA: Diagnosis not present

## 2023-07-01 DIAGNOSIS — Z885 Allergy status to narcotic agent status: Secondary | ICD-10-CM

## 2023-07-01 DIAGNOSIS — K7290 Hepatic failure, unspecified without coma: Secondary | ICD-10-CM | POA: Diagnosis not present

## 2023-07-01 DIAGNOSIS — K7682 Hepatic encephalopathy: Principal | ICD-10-CM | POA: Diagnosis present

## 2023-07-01 DIAGNOSIS — D6489 Other specified anemias: Secondary | ICD-10-CM | POA: Diagnosis present

## 2023-07-01 DIAGNOSIS — K7460 Unspecified cirrhosis of liver: Secondary | ICD-10-CM | POA: Diagnosis present

## 2023-07-01 DIAGNOSIS — Z8261 Family history of arthritis: Secondary | ICD-10-CM

## 2023-07-01 DIAGNOSIS — K862 Cyst of pancreas: Secondary | ICD-10-CM | POA: Diagnosis present

## 2023-07-01 DIAGNOSIS — Z79899 Other long term (current) drug therapy: Secondary | ICD-10-CM | POA: Diagnosis not present

## 2023-07-01 DIAGNOSIS — Z880 Allergy status to penicillin: Secondary | ICD-10-CM

## 2023-07-01 DIAGNOSIS — K5730 Diverticulosis of large intestine without perforation or abscess without bleeding: Secondary | ICD-10-CM | POA: Diagnosis not present

## 2023-07-01 DIAGNOSIS — E785 Hyperlipidemia, unspecified: Secondary | ICD-10-CM | POA: Diagnosis present

## 2023-07-01 DIAGNOSIS — F32A Depression, unspecified: Secondary | ICD-10-CM | POA: Diagnosis present

## 2023-07-01 DIAGNOSIS — Z7984 Long term (current) use of oral hypoglycemic drugs: Secondary | ICD-10-CM | POA: Diagnosis not present

## 2023-07-01 DIAGNOSIS — K7689 Other specified diseases of liver: Secondary | ICD-10-CM | POA: Diagnosis not present

## 2023-07-01 DIAGNOSIS — Z881 Allergy status to other antibiotic agents status: Secondary | ICD-10-CM

## 2023-07-01 DIAGNOSIS — Z833 Family history of diabetes mellitus: Secondary | ICD-10-CM

## 2023-07-01 DIAGNOSIS — E871 Hypo-osmolality and hyponatremia: Secondary | ICD-10-CM | POA: Diagnosis present

## 2023-07-01 DIAGNOSIS — K7581 Nonalcoholic steatohepatitis (NASH): Secondary | ICD-10-CM | POA: Diagnosis present

## 2023-07-01 DIAGNOSIS — E119 Type 2 diabetes mellitus without complications: Secondary | ICD-10-CM

## 2023-07-01 DIAGNOSIS — Z888 Allergy status to other drugs, medicaments and biological substances status: Secondary | ICD-10-CM

## 2023-07-01 DIAGNOSIS — D6959 Other secondary thrombocytopenia: Secondary | ICD-10-CM | POA: Diagnosis present

## 2023-07-01 DIAGNOSIS — Z85828 Personal history of other malignant neoplasm of skin: Secondary | ICD-10-CM

## 2023-07-01 DIAGNOSIS — R4182 Altered mental status, unspecified: Secondary | ICD-10-CM | POA: Diagnosis not present

## 2023-07-01 DIAGNOSIS — E860 Dehydration: Secondary | ICD-10-CM | POA: Diagnosis present

## 2023-07-01 DIAGNOSIS — Z794 Long term (current) use of insulin: Secondary | ICD-10-CM

## 2023-07-01 DIAGNOSIS — K3189 Other diseases of stomach and duodenum: Secondary | ICD-10-CM | POA: Diagnosis present

## 2023-07-01 DIAGNOSIS — R9389 Abnormal findings on diagnostic imaging of other specified body structures: Secondary | ICD-10-CM | POA: Diagnosis not present

## 2023-07-01 DIAGNOSIS — E1165 Type 2 diabetes mellitus with hyperglycemia: Secondary | ICD-10-CM | POA: Diagnosis present

## 2023-07-01 DIAGNOSIS — K766 Portal hypertension: Secondary | ICD-10-CM | POA: Diagnosis present

## 2023-07-01 DIAGNOSIS — E861 Hypovolemia: Secondary | ICD-10-CM | POA: Diagnosis present

## 2023-07-01 DIAGNOSIS — D649 Anemia, unspecified: Secondary | ICD-10-CM | POA: Diagnosis not present

## 2023-07-01 DIAGNOSIS — Z8249 Family history of ischemic heart disease and other diseases of the circulatory system: Secondary | ICD-10-CM | POA: Diagnosis not present

## 2023-07-01 DIAGNOSIS — G9341 Metabolic encephalopathy: Secondary | ICD-10-CM | POA: Diagnosis present

## 2023-07-01 DIAGNOSIS — Z811 Family history of alcohol abuse and dependence: Secondary | ICD-10-CM

## 2023-07-01 DIAGNOSIS — I10 Essential (primary) hypertension: Secondary | ICD-10-CM | POA: Diagnosis present

## 2023-07-01 DIAGNOSIS — Z9189 Other specified personal risk factors, not elsewhere classified: Secondary | ICD-10-CM | POA: Diagnosis not present

## 2023-07-01 DIAGNOSIS — R296 Repeated falls: Secondary | ICD-10-CM | POA: Diagnosis present

## 2023-07-01 DIAGNOSIS — R531 Weakness: Secondary | ICD-10-CM | POA: Diagnosis not present

## 2023-07-01 DIAGNOSIS — W1830XA Fall on same level, unspecified, initial encounter: Secondary | ICD-10-CM | POA: Diagnosis present

## 2023-07-01 DIAGNOSIS — D696 Thrombocytopenia, unspecified: Secondary | ICD-10-CM | POA: Diagnosis not present

## 2023-07-01 DIAGNOSIS — K219 Gastro-esophageal reflux disease without esophagitis: Secondary | ICD-10-CM | POA: Diagnosis present

## 2023-07-01 DIAGNOSIS — Z043 Encounter for examination and observation following other accident: Secondary | ICD-10-CM | POA: Diagnosis not present

## 2023-07-01 DIAGNOSIS — M797 Fibromyalgia: Secondary | ICD-10-CM | POA: Diagnosis present

## 2023-07-01 DIAGNOSIS — R188 Other ascites: Secondary | ICD-10-CM | POA: Diagnosis present

## 2023-07-01 DIAGNOSIS — Z91040 Latex allergy status: Secondary | ICD-10-CM

## 2023-07-01 DIAGNOSIS — Z803 Family history of malignant neoplasm of breast: Secondary | ICD-10-CM

## 2023-07-01 DIAGNOSIS — E8770 Fluid overload, unspecified: Secondary | ICD-10-CM | POA: Diagnosis present

## 2023-07-01 DIAGNOSIS — R410 Disorientation, unspecified: Secondary | ICD-10-CM | POA: Diagnosis not present

## 2023-07-01 DIAGNOSIS — R110 Nausea: Secondary | ICD-10-CM | POA: Diagnosis not present

## 2023-07-01 DIAGNOSIS — F419 Anxiety disorder, unspecified: Secondary | ICD-10-CM | POA: Diagnosis present

## 2023-07-01 DIAGNOSIS — Z90710 Acquired absence of both cervix and uterus: Secondary | ICD-10-CM

## 2023-07-01 DIAGNOSIS — S40011A Contusion of right shoulder, initial encounter: Secondary | ICD-10-CM | POA: Diagnosis present

## 2023-07-01 DIAGNOSIS — R059 Cough, unspecified: Secondary | ICD-10-CM | POA: Diagnosis not present

## 2023-07-01 DIAGNOSIS — R161 Splenomegaly, not elsewhere classified: Secondary | ICD-10-CM | POA: Diagnosis not present

## 2023-07-01 LAB — CBC WITH DIFFERENTIAL/PLATELET
Abs Immature Granulocytes: 0.02 10*3/uL (ref 0.00–0.07)
Basophils Absolute: 0 10*3/uL (ref 0.0–0.1)
Basophils Relative: 0 %
Eosinophils Absolute: 0.2 10*3/uL (ref 0.0–0.5)
Eosinophils Relative: 2 %
HCT: 30.8 % — ABNORMAL LOW (ref 36.0–46.0)
Hemoglobin: 10.1 g/dL — ABNORMAL LOW (ref 12.0–15.0)
Immature Granulocytes: 0 %
Lymphocytes Relative: 16 %
Lymphs Abs: 1.1 10*3/uL (ref 0.7–4.0)
MCH: 28.3 pg (ref 26.0–34.0)
MCHC: 32.8 g/dL (ref 30.0–36.0)
MCV: 86.3 fL (ref 80.0–100.0)
Monocytes Absolute: 1 10*3/uL (ref 0.1–1.0)
Monocytes Relative: 15 %
Neutro Abs: 4.7 10*3/uL (ref 1.7–7.7)
Neutrophils Relative %: 67 %
Platelets: 114 10*3/uL — ABNORMAL LOW (ref 150–400)
RBC: 3.57 MIL/uL — ABNORMAL LOW (ref 3.87–5.11)
RDW: 15.8 % — ABNORMAL HIGH (ref 11.5–15.5)
WBC: 7 10*3/uL (ref 4.0–10.5)
nRBC: 0 % (ref 0.0–0.2)

## 2023-07-01 LAB — COMPREHENSIVE METABOLIC PANEL
ALT: 48 U/L — ABNORMAL HIGH (ref 0–44)
AST: 52 U/L — ABNORMAL HIGH (ref 15–41)
Albumin: 3.3 g/dL — ABNORMAL LOW (ref 3.5–5.0)
Alkaline Phosphatase: 64 U/L (ref 38–126)
Anion gap: 18 — ABNORMAL HIGH (ref 5–15)
BUN: 48 mg/dL — ABNORMAL HIGH (ref 8–23)
CO2: 22 mmol/L (ref 22–32)
Calcium: 9.4 mg/dL (ref 8.9–10.3)
Chloride: 93 mmol/L — ABNORMAL LOW (ref 98–111)
Creatinine, Ser: 1.13 mg/dL — ABNORMAL HIGH (ref 0.44–1.00)
GFR, Estimated: 51 mL/min — ABNORMAL LOW (ref >60)
Glucose, Bld: 204 mg/dL — ABNORMAL HIGH (ref 70–99)
Potassium: 3.6 mmol/L (ref 3.5–5.1)
Sodium: 133 mmol/L — ABNORMAL LOW (ref 135–145)
Total Bilirubin: 3.4 mg/dL — ABNORMAL HIGH (ref 0.3–1.2)
Total Protein: 6.9 g/dL (ref 6.5–8.1)

## 2023-07-01 LAB — CBG MONITORING, ED: Glucose-Capillary: 197 mg/dL — ABNORMAL HIGH (ref 70–99)

## 2023-07-01 LAB — AMMONIA: Ammonia: 103 umol/L — ABNORMAL HIGH (ref 9–35)

## 2023-07-01 LAB — ETHANOL: Alcohol, Ethyl (B): <10 mg/dL (ref <10)

## 2023-07-01 MED ORDER — FAMOTIDINE IN NACL 20-0.9 MG/50ML-% IV SOLN
20.0000 mg | Freq: Once | INTRAVENOUS | Status: DC
  Administered 2023-07-01: 20 mg via INTRAVENOUS
  Filled 2023-07-01: qty 50

## 2023-07-01 MED ORDER — ACETAMINOPHEN 325 MG PO TABS: 650.0000 mg | ORAL_TABLET | Freq: Four times a day (QID) | ORAL | Status: DC | PRN

## 2023-07-01 MED ORDER — ONDANSETRON HCL 4 MG/2ML IJ SOLN
4.0000 mg | Freq: Once | INTRAMUSCULAR | Status: AC
  Administered 2023-07-01: 4 mg via INTRAVENOUS
  Filled 2023-07-01: qty 2

## 2023-07-01 MED ORDER — SPIRONOLACTONE 25 MG PO TABS
50.0000 mg | ORAL_TABLET | Freq: Every day | ORAL | Status: DC
  Filled 2023-07-01: qty 2

## 2023-07-01 MED ORDER — LACTULOSE 10 GM/15ML PO SOLN
30.0000 g | ORAL | Status: DC
  Administered 2023-07-02: 30 g via ORAL
  Filled 2023-07-01: qty 60

## 2023-07-01 MED ORDER — SODIUM CHLORIDE 0.9 % IV BOLUS
1000.0000 mL | Freq: Once | INTRAVENOUS | Status: AC
  Administered 2023-07-01: 1000 mL via INTRAVENOUS

## 2023-07-01 MED ORDER — INSULIN ASPART 100 UNIT/ML IJ SOLN
0.0000 [IU] | Freq: Three times a day (TID) | INTRAMUSCULAR | Status: DC
  Administered 2023-07-02 (×2): 2 [IU] via SUBCUTANEOUS
  Filled 2023-07-01: qty 0.24

## 2023-07-01 MED ORDER — PANTOPRAZOLE SODIUM 40 MG IV SOLR
40.0000 mg | Freq: Once | INTRAVENOUS | Status: AC
  Administered 2023-07-01: 40 mg via INTRAVENOUS
  Filled 2023-07-01: qty 10

## 2023-07-01 MED ORDER — ZINC GLUCONATE 50 MG PO TABS: 50.0000 mg | ORAL_TABLET | Freq: Every day | ORAL | Status: DC

## 2023-07-01 MED ORDER — SODIUM CHLORIDE (PF) 0.9 % IJ SOLN
INTRAMUSCULAR | Status: AC
  Filled 2023-07-01: qty 50

## 2023-07-01 MED ORDER — ACETAMINOPHEN 650 MG RE SUPP: 650.0000 mg | Freq: Four times a day (QID) | RECTAL | Status: DC | PRN

## 2023-07-01 MED ORDER — IOHEXOL 300 MG/ML  SOLN
100.0000 mL | Freq: Once | INTRAMUSCULAR | Status: AC | PRN
  Administered 2023-07-01: 100 mL via INTRAVENOUS

## 2023-07-01 MED ORDER — PANTOPRAZOLE SODIUM 40 MG PO TBEC
40.0000 mg | DELAYED_RELEASE_TABLET | Freq: Every day | ORAL | Status: DC
  Administered 2023-07-02 – 2023-07-05 (×4): 40 mg via ORAL
  Filled 2023-07-01 (×4): qty 1

## 2023-07-01 MED ORDER — ENOXAPARIN SODIUM 40 MG/0.4ML IJ SOSY: 40.0000 mg | PREFILLED_SYRINGE | INTRAMUSCULAR | Status: DC

## 2023-07-01 MED ORDER — LACTULOSE 10 GM/15ML PO SOLN
30.0000 g | Freq: Once | ORAL | Status: AC
  Administered 2023-07-01: 30 g via ORAL
  Filled 2023-07-01: qty 60

## 2023-07-01 MED ORDER — RIFAXIMIN 550 MG PO TABS
550.0000 mg | ORAL_TABLET | Freq: Two times a day (BID) | ORAL | Status: DC
  Administered 2023-07-02 – 2023-07-05 (×8): 550 mg via ORAL
  Filled 2023-07-01 (×9): qty 1

## 2023-07-01 MED ORDER — INSULIN DETEMIR 100 UNIT/ML ~~LOC~~ SOLN
20.0000 [IU] | Freq: Every day | SUBCUTANEOUS | Status: DC
  Filled 2023-07-01: qty 0.2

## 2023-07-01 MED ORDER — FUROSEMIDE 40 MG PO TABS: 20.0000 mg | ORAL_TABLET | Freq: Every day | ORAL | Status: DC

## 2023-07-01 MED ORDER — ESCITALOPRAM OXALATE 10 MG PO TABS
5.0000 mg | ORAL_TABLET | Freq: Every day | ORAL | Status: DC
  Administered 2023-07-02 – 2023-07-05 (×4): 5 mg via ORAL
  Filled 2023-07-01 (×3): qty 1

## 2023-07-01 NOTE — ED Triage Notes (Signed)
Pt BIBA from home w/ family complaints N/V, being lethargic x1 month and altered x1wk. Hx of non-alcoholic cirrhosis on lactulose. Pt A&O x3 w/ forgetfulness.

## 2023-07-01 NOTE — ED Provider Notes (Signed)
[facility] EMERGENCY DEPARTMENT AT [facility] Provider Note   CSN: [id number] Arrival date & time: 07/01/23  2019     History  Chief Complaint  Patient presents with   Altered Mental Status    Dana Dyer is a 72 y.o. female history of NASH, diabetes here presenting with altered mental status and recurrent falls.  Patient has been having declining mental status over the last 2 weeks.  Patient has been seeing her doctor recently and was thought to have worsening cirrhosis.  Patient has been vomiting today.  Patient apparently had a mechanical fall after an episode of vomiting and hit her back.  Patient also hit her head as well.  Patient has been compliant with her lactulose.  The history is provided by the patient.       Home Medications Prior to Admission medications   Medication Sig Start Date End Date Taking? Authorizing Provider  clobetasol cream (TEMOVATE) 0.05 % Apply 1 Application topically 2 (two) times daily as needed (yeast infection).    [provider]  Continuous Blood Gluc Sensor (DEXCOM G7 SENSOR) MISC 1 Device by Does not apply route as directed. Every 10 days 05/09/22   Shamleffer, Konrad Dolores, MD  escitalopram (LEXAPRO) 10 MG tablet Take 0.5 tablets (5 mg total) by mouth daily. 06/27/23 07/27/23  Karie Georges, MD  furosemide (LASIX) 40 MG tablet Take 40 mg by mouth daily.    [provider]  insulin glargine (LANTUS SOLOSTAR) 100 UNIT/ML Solostar Pen Inject 26 Units into the skin daily. 06/15/23   Shamleffer, Konrad Dolores, MD  insulin lispro (HUMALOG KWIKPEN) 200 UNIT/ML KwikPen Max daily 60 units 06/15/23   Shamleffer, Konrad Dolores, MD  Insulin Pen Needle (BD PEN NEEDLE NANO 2ND GEN) 32G X 4 MM MISC 1 Device by Other route in the morning, at noon, in the evening, and at bedtime. 06/15/23   Shamleffer, Konrad Dolores, MD  lactulose (CHRONULAC) 10 GM/15ML solution TAKE 6 TABLESPOONS DAILY 01/24/23   Cirigliano, Vito V, DO   metFORMIN (GLUCOPHAGE-XR) 750 MG 24 hr tablet Take 1 tablet (750 mg total) by mouth 2 (two) times daily. 06/15/23   Shamleffer, Konrad Dolores, MD  metoprolol tartrate (LOPRESSOR) 25 MG tablet Take 0.5 tablets (12.5 mg total) by mouth 2 (two) times daily. 08/19/22   Nahser, Deloris Ping, MD  mirtazapine (REMERON SOL-TAB) 15 MG disintegrating tablet Start with 1/2 tablet daily at bedtime for 1 week, then increase to 1 tablet daily at bedtime. 06/27/23   Karie Georges, MD  omeprazole (PRILOSEC) 20 MG capsule Take 20 mg by mouth at bedtime.    [provider]  rifaximin (XIFAXAN) 550 MG TABS tablet Take 1 tablet (550 mg total) by mouth 2 (two) times daily. 01/18/23   Cirigliano, Vito V, DO  spironolactone (ALDACTONE) 50 MG tablet TAKE 1 TABLET BY MOUTH EVERY DAY 03/02/23   Cirigliano, Vito V, DO  zinc gluconate 50 MG tablet Take 1 tablet (50 mg total) by mouth daily. 10/20/22   Doree Albee, PA-C      Allergies    Amoxicillin-pot clavulanate, Codeine sulfate, Diflucan [fluconazole], Epinephrine, Erythromycin base, Invokana [canagliflozin], Irbesartan-hydrochlorothiazide, Victoza [liraglutide], Latex, and Ozempic (0.25 or 0.5 mg-dose) [semaglutide(0.25 or 0.5mg -dos)]    Review of Systems   Review of Systems  Psychiatric/Behavioral:  Positive for confusion.   All other systems reviewed and are negative.   Physical Exam Updated Vital Signs BP 121/60 (BP Location: Left Arm)   Pulse (!) 101  Temp 98.4 F (36.9 C) (Oral)   Resp 17   Ht 5\' 3"  (1.6 m)   Wt 75.8 kg   SpO2 96%   BMI 29.58 kg/m  Physical Exam Vitals and nursing note reviewed.  Constitutional:      Comments: Tired and confused  HENT:     Head: Normocephalic.     Nose: Nose normal.     Mouth/Throat:     Mouth: Mucous membranes are dry.  Eyes:     Extraocular Movements: Extraocular movements intact.     Pupils: Pupils are equal, round, and reactive to light.  Cardiovascular:     Rate and Rhythm: Normal rate  and regular rhythm.     Pulses: Normal pulses.     Heart sounds: Normal heart sounds.  Pulmonary:     Effort: Pulmonary effort is normal.     Breath sounds: Normal breath sounds.  Abdominal:     General: Abdomen is flat.     Palpations: Abdomen is soft.     Comments: Distended with fluid wave  Musculoskeletal:     Cervical back: Normal range of motion and neck supple.     Comments: Patient has bruising on the right scapular area.  Patient has bruising on the left forearm  Skin:    General: Skin is warm.     Capillary Refill: Capillary refill takes less than 2 seconds.  Neurological:     Comments: Confused and some mild asterixis  Psychiatric:     Comments: Unable      ED Results / Procedures / Treatments   Labs (all labs ordered are listed, but only abnormal results are displayed) Labs Reviewed  CBC WITH DIFFERENTIAL/PLATELET - Abnormal; Notable for the following components:      Result Value   RBC 3.57 (*)    Hemoglobin 10.1 (*)    HCT 30.8 (*)    RDW 15.8 (*)    Platelets 114 (*)    All other components within normal limits  COMPREHENSIVE METABOLIC PANEL - Abnormal; Notable for the following components:   Sodium 133 (*)    Chloride 93 (*)    Glucose, Bld 204 (*)    BUN 48 (*)    Creatinine, Ser 1.13 (*)    Albumin 3.3 (*)    AST 52 (*)    ALT 48 (*)    Total Bilirubin 3.4 (*)    GFR, Estimated 51 (*)    Anion gap 18 (*)    All other components within normal limits  CBG MONITORING, ED - Abnormal; Notable for the following components:   Glucose-Capillary 197 (*)    All other components within normal limits  ETHANOL  AMMONIA  URINALYSIS, ROUTINE W REFLEX MICROSCOPIC    EKG None  Radiology DG Forearm Left  Result Date: 07/01/2023 CLINICAL DATA:  Fall EXAM: LEFT FOREARM - 2 VIEW COMPARISON:  None Available. FINDINGS: There is no evidence of fracture or other focal bone lesions. Soft tissues are unremarkable. IMPRESSION: Negative. Electronically Signed   By:  Jasmine Pang M.D.   On: 07/01/2023 21:22    Procedures Procedures    CRITICAL CARE Performed by: Richardean Canal   Total critical care time: 38 minutes  Critical care time was exclusive of separately billable procedures and treating other patients.  Critical care was necessary to treat or prevent imminent or life-threatening deterioration.  Critical care was time spent personally by me on the following activities: development of treatment plan with patient and/or surrogate as  well as nursing, discussions with consultants, evaluation of patient's response to treatment, examination of patient, obtaining history from patient or surrogate, ordering and performing treatments and interventions, ordering and review of laboratory studies, ordering and review of radiographic studies, pulse oximetry and re-evaluation of patient's condition.   Medications Ordered in ED Medications  sodium chloride 0.9 % bolus 1,000 mL (1,000 mLs Intravenous New Bag/Given 07/01/23 2057)  ondansetron (ZOFRAN) injection 4 mg (4 mg Intravenous Given 07/01/23 2058)  iohexol (OMNIPAQUE) 300 MG/ML solution 100 mL (100 mLs Intravenous Contrast Given 07/01/23 2145)    ED Course/ Medical Decision Making/ A&P                             Medical Decision Making NESA SZOSTEK is a 72 y.o. female here presenting with confusion.  Patient has nonalcoholic cirrhosis.  Patient has been having declining mental status and declining p.o. intake.  Patient had vomiting and a fall.  Patient has bruising on the right scapular area.  Will do trauma scan.  Also consider hepatic encephalopathy.  Will get ammonia level and CBC and CMP as well.  11:20 PM I reviewed patient's labs and patient has anion gap of 18.  Patient BUN is 48.  Patient's ammonia level is 103.  I messaged Dr. Elnoria Howard who is covering [facility] to see the patient as a consult tomorrow.  I ordered lactulose and patient will be admitted for hepatic encephalopathy.  Problems  Addressed: Hepatic encephalopathy (HCC): acute illness or injury  Amount and/or Complexity of Data Reviewed Labs: ordered. Decision-making details documented in ED Course. Radiology: ordered and independent interpretation performed. Decision-making details documented in ED Course. ECG/medicine tests: ordered and independent interpretation performed. Decision-making details documented in ED Course.  Risk Prescription drug management. Decision regarding hospitalization.    Final Clinical Impression(s) / ED Diagnoses Final diagnoses:  None    Rx / DC Orders ED Discharge Orders     None         Charlynne Pander, MD 07/01/23 2330

## 2023-07-01 NOTE — H&P (Signed)
History and Physical    Dana Dyer HQI:[id number] DOB: 1951-02-15 DOA: 07/01/2023  PCP: Karie Georges, MD   Chief Complaint: Leeroy Dyer  HPI: BEYA Dyer is a 72 y.o. female with medical history significant of decompensated Dana Dyer cirrhosis, GERD, hypertension, hyperlipidemia who presents emerged part with altered mental status and falls.  Patient's husband is at bedside states that over the last several weeks she is having worsening confusion.  She is also had some nausea and vomiting.  She has been taking reduced doses of her lactulose and been having only 1 bowel movement per day.  She presented to the emergency department where she was confused.  Vitals were obtained which were stable.  Labs showed WBC 7.0, hemoglobin 10.1, platelets 114, sodium 133, creatinine 1.1, AST 52, ALT 45.  CT chest abdomen pelvis demonstrated cirrhosis with a 1.2 cm lesion in the left lobe of the liver with recommended outpatient liver MRI there was also likely IPMN's that will require outpatient follow-up.  Patient was admitted for further workup.  On admission she had asterixis and clonus.  She was somnolent and difficult to arouse.   Review of Systems: Review of Systems  Constitutional:  Negative for chills and fever.  Cardiovascular:  Negative for chest pain and palpitations.  Genitourinary:  Negative for dysuria.  All other systems reviewed and are negative.    As per HPI otherwise 10 point review of systems negative.   Allergies  Allergen Reactions   Amoxicillin-Pot Clavulanate     Severe diarrhea   Codeine Sulfate     REACTION: "hyper"   Diflucan [Fluconazole] Hives   Epinephrine Other (See Comments)    Racing heart rate   Erythromycin Base Diarrhea and Other (See Comments)    cramps   Invokana [Canagliflozin] Other (See Comments)    Recurrent yeast infections   Irbesartan-Hydrochlorothiazide Other (See Comments)    Unknown reaction   Victoza [Liraglutide] Nausea Only   Latex Rash     Long term use   Ozempic (0.25 Or 0.5 Mg-Dose) [Semaglutide(0.25 Or 0.5mg -Dos)] Nausea Only    Past Medical History:  Diagnosis Date   ALLERGIC RHINITIS 10/09/2007   Allergy    Arthritis    hips, spine   DIABETES MELLITUS, UNCONTROLLED 03/16/2009   FATIGUE 10/21/2008   FIBROMYALGIA 10/09/2007   GERD (gastroesophageal reflux disease)    diet  controlled, no meds   H/O vaginal hysterectomy    HYPERLIPIDEMIA 10/09/2007   diet controlled, no meds   HYPERTENSION 10/09/2007   Morton's neuroma    OBSTRUCTIVE SLEEP APNEA 10/09/2007   Palpitations    Plantar fasciitis    History - right   Sleep apnea    Uses CPAP   TRANSAMINASES, SERUM, ELEVATED 07/08/2009    Past Surgical History:  Procedure Laterality Date   ABDOMINAL HYSTERECTOMY  1979   prolapsed uterus   BASAL CELL CARCINOMA EXCISION     BREAST BIOPSY Left 2012   left 2012 stereo   BREAST REDUCTION SURGERY Bilateral 1992   COLONOSCOPY  2006   [facility]   mamoplasty     reduction   REDUCTION MAMMAPLASTY Bilateral 1992   TONSILLECTOMY  1959   TUBAL LIGATION  1976     reports that she has never smoked. She has never used smokeless tobacco. She reports that she does not drink alcohol and does not use drugs.  Family History  Problem Relation Age of Onset   Diabetes Mother    Arthritis Mother    Breast cancer  Mother 91   Alcohol abuse Father    Arthritis Father    Hypertension Maternal Grandfather    Miscarriages / Stillbirths Paternal Grandfather    Breast cancer Maternal Aunt        diagnosed in her 59's   Colon polyps Neg Hx    Colon cancer Neg Hx    Esophageal cancer Neg Hx    Rectal cancer Neg Hx    Stomach cancer Neg Hx     Prior to Admission medications   Medication Sig Start Date End Date Taking? Authorizing Provider  clobetasol cream (TEMOVATE) 0.05 % Apply 1 Application topically 2 (two) times daily as needed (yeast infection).    [provider]  Continuous Blood Gluc Sensor (DEXCOM G7  SENSOR) MISC 1 Device by Does not apply route as directed. Every 10 days 05/09/22   Shamleffer, Konrad Dolores, MD  escitalopram (LEXAPRO) 10 MG tablet Take 0.5 tablets (5 mg total) by mouth daily. 06/27/23 07/27/23  Karie Georges, MD  furosemide (LASIX) 40 MG tablet Take 40 mg by mouth daily.    [provider]  insulin glargine (LANTUS SOLOSTAR) 100 UNIT/ML Solostar Pen Inject 26 Units into the skin daily. 06/15/23   Shamleffer, Konrad Dolores, MD  insulin lispro (HUMALOG KWIKPEN) 200 UNIT/ML KwikPen Max daily 60 units 06/15/23   Shamleffer, Konrad Dolores, MD  Insulin Pen Needle (BD PEN NEEDLE NANO 2ND GEN) 32G X 4 MM MISC 1 Device by Other route in the morning, at noon, in the evening, and at bedtime. 06/15/23   Shamleffer, Konrad Dolores, MD  lactulose (CHRONULAC) 10 GM/15ML solution TAKE 6 TABLESPOONS DAILY 01/24/23   Cirigliano, Vito V, DO  metFORMIN (GLUCOPHAGE-XR) 750 MG 24 hr tablet Take 1 tablet (750 mg total) by mouth 2 (two) times daily. 06/15/23   Shamleffer, Konrad Dolores, MD  metoprolol tartrate (LOPRESSOR) 25 MG tablet Take 0.5 tablets (12.5 mg total) by mouth 2 (two) times daily. 08/19/22   Nahser, Deloris Ping, MD  mirtazapine (REMERON SOL-TAB) 15 MG disintegrating tablet Start with 1/2 tablet daily at bedtime for 1 week, then increase to 1 tablet daily at bedtime. 06/27/23   Karie Georges, MD  omeprazole (PRILOSEC) 20 MG capsule Take 20 mg by mouth at bedtime.    [provider]  rifaximin (XIFAXAN) 550 MG TABS tablet Take 1 tablet (550 mg total) by mouth 2 (two) times daily. 01/18/23   Cirigliano, Vito V, DO  spironolactone (ALDACTONE) 50 MG tablet TAKE 1 TABLET BY MOUTH EVERY DAY 03/02/23   Cirigliano, Vito V, DO  zinc gluconate 50 MG tablet Take 1 tablet (50 mg total) by mouth daily. 10/20/22   Doree Albee, PA-C    Physical Exam: Vitals:   07/01/23 2027 07/01/23 2032 07/01/23 2034 07/01/23 2330  BP:  121/60  101/74  Pulse:  (!) 101  91  Resp:  17  12   Temp:  98.4 F (36.9 C)    TempSrc:  Oral    SpO2: 98% 96%  96%  Weight:   75.8 kg   Height:   5\' 3"  (1.6 m)    Physical Exam Vitals reviewed.  Constitutional:      Appearance: She is obese.  HENT:     Head: Normocephalic.     Nose: Nose normal.     Mouth/Throat:     Mouth: Mucous membranes are moist.     Pharynx: Oropharynx is clear.  Cardiovascular:     Rate and Rhythm: Normal rate.  Pulses: Normal pulses.     Heart sounds: Normal heart sounds.  Pulmonary:     Effort: Pulmonary effort is normal.     Breath sounds: Normal breath sounds.  Abdominal:     General: Abdomen is flat. Bowel sounds are normal. There is distension.  Musculoskeletal:        General: Normal range of motion.     Cervical back: Normal range of motion.  Skin:    General: Skin is warm.     Capillary Refill: Capillary refill takes less than 2 seconds.  Neurological:     General: No focal deficit present.     Mental Status: Mental status is at baseline.  Psychiatric:        Mood and Affect: Mood normal.        Labs on Admission: I have personally reviewed the patients's labs and imaging studies.  Assessment/Plan Principal Problem:   Hepatic encephalopathy (HCC)   # Decompensated cirrhosis # Hepatic encephalopathy - No clear etiology of precipitating event.  Patient not on any offending medications.  No obvious evidence of infection however infectious workup is still pending - Fluid wave on examination  Plan: Diagnostic paracentesis Follow-up blood cultures and urine cultures 30 g of lactulose every 4 hours until patient regains mentation Restart spironolactone and Lasix as patient appears slightly hypervolemic Resume rifaximin  GERD-continue Protonix  Depression-continue Lexapro  Type 2 diabetes-resume home basal bolus regimen  Incidentaloma's-patient will need outpatient MRI liver as well as imaging of pancreatic cysts  Admission status: Observation  Med-Surg  Certification: The appropriate patient status for this patient is OBSERVATION. Observation status is judged to be reasonable and necessary in order to provide the required intensity of service to ensure the patient's safety. The patient's presenting symptoms, physical exam findings, and initial radiographic and laboratory data in the context of their medical condition is felt to place them at decreased risk for further clinical deterioration. Furthermore, it is anticipated that the patient will be medically stable for discharge from the hospital within 2 midnights of admission.     Alan Mulder MD [facility] If 7PM-7AM, please contact night-coverage [email or web address]  07/01/2023, 11:48 PM

## 2023-07-02 ENCOUNTER — Inpatient Hospital Stay (HOSPITAL_COMMUNITY): Payer: Medicare PPO

## 2023-07-02 DIAGNOSIS — K7581 Nonalcoholic steatohepatitis (NASH): Secondary | ICD-10-CM | POA: Diagnosis present

## 2023-07-02 DIAGNOSIS — Z8249 Family history of ischemic heart disease and other diseases of the circulatory system: Secondary | ICD-10-CM | POA: Diagnosis not present

## 2023-07-02 DIAGNOSIS — D649 Anemia, unspecified: Secondary | ICD-10-CM | POA: Diagnosis not present

## 2023-07-02 DIAGNOSIS — M797 Fibromyalgia: Secondary | ICD-10-CM | POA: Diagnosis present

## 2023-07-02 DIAGNOSIS — K7460 Unspecified cirrhosis of liver: Secondary | ICD-10-CM | POA: Diagnosis present

## 2023-07-02 DIAGNOSIS — I10 Essential (primary) hypertension: Secondary | ICD-10-CM | POA: Diagnosis present

## 2023-07-02 DIAGNOSIS — R188 Other ascites: Secondary | ICD-10-CM | POA: Diagnosis present

## 2023-07-02 DIAGNOSIS — D696 Thrombocytopenia, unspecified: Secondary | ICD-10-CM

## 2023-07-02 DIAGNOSIS — D6489 Other specified anemias: Secondary | ICD-10-CM | POA: Diagnosis present

## 2023-07-02 DIAGNOSIS — E119 Type 2 diabetes mellitus without complications: Secondary | ICD-10-CM

## 2023-07-02 DIAGNOSIS — W1830XA Fall on same level, unspecified, initial encounter: Secondary | ICD-10-CM | POA: Diagnosis present

## 2023-07-02 DIAGNOSIS — Z794 Long term (current) use of insulin: Secondary | ICD-10-CM | POA: Diagnosis not present

## 2023-07-02 DIAGNOSIS — F32A Depression, unspecified: Secondary | ICD-10-CM | POA: Diagnosis present

## 2023-07-02 DIAGNOSIS — Z7984 Long term (current) use of oral hypoglycemic drugs: Secondary | ICD-10-CM | POA: Diagnosis not present

## 2023-07-02 DIAGNOSIS — K766 Portal hypertension: Secondary | ICD-10-CM | POA: Diagnosis present

## 2023-07-02 DIAGNOSIS — K7689 Other specified diseases of liver: Secondary | ICD-10-CM | POA: Diagnosis not present

## 2023-07-02 DIAGNOSIS — E871 Hypo-osmolality and hyponatremia: Secondary | ICD-10-CM

## 2023-07-02 DIAGNOSIS — E860 Dehydration: Secondary | ICD-10-CM | POA: Diagnosis present

## 2023-07-02 DIAGNOSIS — Y92008 Other place in unspecified non-institutional (private) residence as the place of occurrence of the external cause: Secondary | ICD-10-CM | POA: Diagnosis not present

## 2023-07-02 DIAGNOSIS — R4182 Altered mental status, unspecified: Secondary | ICD-10-CM | POA: Diagnosis not present

## 2023-07-02 DIAGNOSIS — K862 Cyst of pancreas: Secondary | ICD-10-CM | POA: Diagnosis present

## 2023-07-02 DIAGNOSIS — F419 Anxiety disorder, unspecified: Secondary | ICD-10-CM | POA: Diagnosis present

## 2023-07-02 DIAGNOSIS — N179 Acute kidney failure, unspecified: Secondary | ICD-10-CM | POA: Insufficient documentation

## 2023-07-02 DIAGNOSIS — K219 Gastro-esophageal reflux disease without esophagitis: Secondary | ICD-10-CM | POA: Diagnosis present

## 2023-07-02 DIAGNOSIS — D6959 Other secondary thrombocytopenia: Secondary | ICD-10-CM | POA: Diagnosis present

## 2023-07-02 DIAGNOSIS — K7682 Hepatic encephalopathy: Secondary | ICD-10-CM | POA: Diagnosis present

## 2023-07-02 DIAGNOSIS — G9341 Metabolic encephalopathy: Secondary | ICD-10-CM | POA: Diagnosis present

## 2023-07-02 DIAGNOSIS — E861 Hypovolemia: Secondary | ICD-10-CM | POA: Diagnosis present

## 2023-07-02 DIAGNOSIS — E1165 Type 2 diabetes mellitus with hyperglycemia: Secondary | ICD-10-CM | POA: Diagnosis present

## 2023-07-02 DIAGNOSIS — E785 Hyperlipidemia, unspecified: Secondary | ICD-10-CM | POA: Diagnosis present

## 2023-07-02 DIAGNOSIS — Z79899 Other long term (current) drug therapy: Secondary | ICD-10-CM | POA: Diagnosis not present

## 2023-07-02 LAB — COMPREHENSIVE METABOLIC PANEL WITH GFR
ALT: 46 U/L — ABNORMAL HIGH (ref 0–44)
AST: 48 U/L — ABNORMAL HIGH (ref 15–41)
Albumin: 3.1 g/dL — ABNORMAL LOW (ref 3.5–5.0)
Alkaline Phosphatase: 62 U/L (ref 38–126)
Anion gap: 14 (ref 5–15)
BUN: 43 mg/dL — ABNORMAL HIGH (ref 8–23)
CO2: 20 mmol/L — ABNORMAL LOW (ref 22–32)
Calcium: 8.8 mg/dL — ABNORMAL LOW (ref 8.9–10.3)
Chloride: 96 mmol/L — ABNORMAL LOW (ref 98–111)
Creatinine, Ser: 1.02 mg/dL — ABNORMAL HIGH (ref 0.44–1.00)
GFR, Estimated: 58 mL/min — ABNORMAL LOW (ref >60)
Glucose, Bld: 163 mg/dL — ABNORMAL HIGH (ref 70–99)
Potassium: 4 mmol/L (ref 3.5–5.1)
Sodium: 130 mmol/L — ABNORMAL LOW (ref 135–145)
Total Bilirubin: 3.3 mg/dL — ABNORMAL HIGH (ref 0.3–1.2)
Total Protein: 6.6 g/dL (ref 6.5–8.1)

## 2023-07-02 LAB — URINALYSIS, ROUTINE W REFLEX MICROSCOPIC
Bacteria, UA: NONE SEEN
Bilirubin Urine: NEGATIVE
Glucose, UA: NEGATIVE mg/dL
Hgb urine dipstick: NEGATIVE
Ketones, ur: NEGATIVE mg/dL
Leukocytes,Ua: NEGATIVE
Nitrite: NEGATIVE
Protein, ur: NEGATIVE mg/dL
Specific Gravity, Urine: >1.046 — ABNORMAL HIGH (ref 1.005–1.030)
pH: 5 (ref 5.0–8.0)

## 2023-07-02 LAB — CBC
HCT: 29.2 % — ABNORMAL LOW (ref 36.0–46.0)
Hemoglobin: 9.5 g/dL — ABNORMAL LOW (ref 12.0–15.0)
MCH: 28.3 pg (ref 26.0–34.0)
MCHC: 32.5 g/dL (ref 30.0–36.0)
MCV: 86.9 fL (ref 80.0–100.0)
Platelets: 100 10*3/uL — ABNORMAL LOW (ref 150–400)
RBC: 3.36 MIL/uL — ABNORMAL LOW (ref 3.87–5.11)
RDW: 15.6 % — ABNORMAL HIGH (ref 11.5–15.5)
WBC: 6.2 10*3/uL (ref 4.0–10.5)
nRBC: 0 % (ref 0.0–0.2)

## 2023-07-02 LAB — CBC WITH DIFFERENTIAL/PLATELET
Abs Immature Granulocytes: 0.01 10*3/uL (ref 0.00–0.07)
Basophils Absolute: 0 10*3/uL (ref 0.0–0.1)
Basophils Relative: 1 %
Eosinophils Absolute: 0.2 10*3/uL (ref 0.0–0.5)
Eosinophils Relative: 3 %
HCT: 30.1 % — ABNORMAL LOW (ref 36.0–46.0)
Hemoglobin: 9.6 g/dL — ABNORMAL LOW (ref 12.0–15.0)
Immature Granulocytes: 0 %
Lymphocytes Relative: 18 %
Lymphs Abs: 1.1 10*3/uL (ref 0.7–4.0)
MCH: 27.7 pg (ref 26.0–34.0)
MCHC: 31.9 g/dL (ref 30.0–36.0)
MCV: 87 fL (ref 80.0–100.0)
Monocytes Absolute: 0.8 10*3/uL (ref 0.1–1.0)
Monocytes Relative: 13 %
Neutro Abs: 4 10*3/uL (ref 1.7–7.7)
Neutrophils Relative %: 65 %
Platelets: 98 10*3/uL — ABNORMAL LOW (ref 150–400)
RBC: 3.46 MIL/uL — ABNORMAL LOW (ref 3.87–5.11)
RDW: 15.9 % — ABNORMAL HIGH (ref 11.5–15.5)
WBC: 6.1 10*3/uL (ref 4.0–10.5)
nRBC: 0 % (ref 0.0–0.2)

## 2023-07-02 LAB — GLUCOSE, CAPILLARY
Glucose-Capillary: 146 mg/dL — ABNORMAL HIGH (ref 70–99)
Glucose-Capillary: 140 mg/dL — ABNORMAL HIGH (ref 70–99)
Glucose-Capillary: 162 mg/dL — ABNORMAL HIGH (ref 70–99)

## 2023-07-02 LAB — AMMONIA: Ammonia: 51 umol/L — ABNORMAL HIGH (ref 9–35)

## 2023-07-02 LAB — PROTIME-INR
INR: 1.1 (ref 0.8–1.2)
Prothrombin Time: 14.8 s (ref 11.4–15.2)

## 2023-07-02 LAB — CREATININE, SERUM
Creatinine, Ser: 0.99 mg/dL (ref 0.44–1.00)
GFR, Estimated: >60 mL/min (ref >60)

## 2023-07-02 MED ORDER — LACTULOSE 10 GM/15ML PO SOLN
20.0000 g | Freq: Three times a day (TID) | ORAL | Status: DC
  Administered 2023-07-02 – 2023-07-03 (×6): 20 g via ORAL
  Filled 2023-07-02 (×6): qty 30

## 2023-07-02 MED ORDER — ONDANSETRON HCL 4 MG/2ML IJ SOLN
4.0000 mg | Freq: Four times a day (QID) | INTRAMUSCULAR | Status: DC | PRN
  Filled 2023-07-02: qty 2

## 2023-07-02 MED ORDER — LACTULOSE ENEMA: 300.0000 mL | Freq: Once | ORAL | Status: DC

## 2023-07-02 MED ORDER — METRONIDAZOLE 500 MG/100ML IV SOLN
500.0000 mg | Freq: Two times a day (BID) | INTRAVENOUS | Status: DC
  Administered 2023-07-02: 500 mg via INTRAVENOUS
  Filled 2023-07-02: qty 100

## 2023-07-02 MED ORDER — SODIUM CHLORIDE 0.9 % IV SOLN
1.0000 g | INTRAVENOUS | Status: DC
  Administered 2023-07-02: 1 g via INTRAVENOUS
  Filled 2023-07-02: qty 10

## 2023-07-02 MED ORDER — SODIUM CHLORIDE 0.9 % IV SOLN: INTRAVENOUS | Status: DC

## 2023-07-02 MED ORDER — ONDANSETRON HCL 4 MG/2ML IJ SOLN
4.0000 mg | Freq: Once | INTRAMUSCULAR | Status: AC
  Administered 2023-07-02: 4 mg via INTRAVENOUS
  Filled 2023-07-02: qty 2

## 2023-07-02 MED ORDER — INSULIN DETEMIR 100 UNIT/ML ~~LOC~~ SOLN
10.0000 [IU] | Freq: Two times a day (BID) | SUBCUTANEOUS | Status: DC
  Filled 2023-07-02: qty 0.1

## 2023-07-02 NOTE — Consult Note (Signed)
CONSULT NOTE FOR Dana Dyer  Reason for Consult: Hepatic encephalopathy and MASH cirrhosis Referring Physician: [facility]  Gearldine Bienenstock HPI: This is a 72 year old female with a PMH of MASH cirrhosis diagnosed in 2019, side branch IPMN with the largest cyst measuring 4.1 cm, GERD, HTN, and hyperlipidemia admitted for AMS.  Per the husband's report she was experiencing more falls as a result of her confusion.  With recent symptoms of nausea and vomiting she decreased her dose of lactulose.  This decreased her bowel movements to once per day.  Work up in the ER showed her cirrhosis, but a new 1.2 cm left lobe of the liver lesion.  She was noted to have a mild elevation in her creatinine and in the past she was treated with low dose aldactone and lasix on alternating days.  Past Medical History:  Diagnosis Date   ALLERGIC RHINITIS 10/09/2007   Allergy    Arthritis    hips, spine   DIABETES MELLITUS, UNCONTROLLED 03/16/2009   FATIGUE 10/21/2008   FIBROMYALGIA 10/09/2007   GERD (gastroesophageal reflux disease)    diet  controlled, no meds   H/O vaginal hysterectomy    HYPERLIPIDEMIA 10/09/2007   diet controlled, no meds   HYPERTENSION 10/09/2007   Morton's neuroma    OBSTRUCTIVE SLEEP APNEA 10/09/2007   Palpitations    Plantar fasciitis    History - right   Sleep apnea    Uses CPAP   TRANSAMINASES, SERUM, ELEVATED 07/08/2009    Past Surgical History:  Procedure Laterality Date   ABDOMINAL HYSTERECTOMY  1979   prolapsed uterus   BASAL CELL CARCINOMA EXCISION     BREAST BIOPSY Left 2012   left 2012 stereo   BREAST REDUCTION SURGERY Bilateral 1992   COLONOSCOPY  2006   [facility]   mamoplasty     reduction   REDUCTION MAMMAPLASTY Bilateral 1992   TONSILLECTOMY  1959   TUBAL LIGATION  1976    Family History  Problem Relation Age of Onset   Diabetes Mother    Arthritis Mother    Breast cancer Mother 15   Alcohol abuse Father    Arthritis Father    Hypertension  Maternal Grandfather    Miscarriages / Stillbirths Paternal Grandfather    Breast cancer Maternal Aunt        diagnosed in her 19's   Colon polyps Neg Hx    Colon cancer Neg Hx    Esophageal cancer Neg Hx    Rectal cancer Neg Hx    Stomach cancer Neg Hx     Social History:  reports that she has never smoked. She has never used smokeless tobacco. She reports that she does not drink alcohol and does not use drugs.  Allergies:  Allergies  Allergen Reactions   Amoxicillin-Pot Clavulanate     Severe diarrhea   Codeine Sulfate     REACTION: "hyper"   Diflucan [Fluconazole] Hives   Epinephrine Other (See Comments)    Racing heart rate   Erythromycin Base Diarrhea and Other (See Comments)    cramps   Invokana [Canagliflozin] Other (See Comments)    Recurrent yeast infections   Irbesartan-Hydrochlorothiazide Other (See Comments)    Unknown reaction   Victoza [Liraglutide] Nausea Only   Latex Rash    Long term use   Ozempic (0.25 Or 0.5 Mg-Dose) [Semaglutide(0.25 Or 0.5mg -Dos)] Nausea Only    Medications: Scheduled:  escitalopram  5 mg Oral Daily   insulin aspart  0-24 Units Subcutaneous  TID WC   lactulose  20 g Oral TID   pantoprazole  40 mg Oral Daily   rifaximin  550 mg Oral BID   Continuous:  sodium chloride 75 mL/hr at 07/02/23 0843    Results for orders placed or performed during the hospital encounter of 07/01/23 (from the past 24 hour(s))  CBG monitoring, ED     Status: Abnormal   Collection Time: 07/01/23  8:32 PM  Result Value Ref Range   Glucose-Capillary 197 (H) 70 - 99 mg/dL  CBC with Differential     Status: Abnormal   Collection Time: 07/01/23  8:40 PM  Result Value Ref Range   WBC 7.0 4.0 - 10.5 K/uL   RBC 3.57 (L) 3.87 - 5.11 MIL/uL   Hemoglobin 10.1 (L) 12.0 - 15.0 g/dL   HCT 13.0 (L) 86.5 - 78.4 %   MCV 86.3 80.0 - 100.0 fL   MCH 28.3 26.0 - 34.0 pg   MCHC 32.8 30.0 - 36.0 g/dL   RDW 69.6 (H) 29.5 - 28.4 %   Platelets 114 (L) 150 - 400 K/uL    nRBC 0.0 0.0 - 0.2 %   Neutrophils Relative % 67 %   Neutro Abs 4.7 1.7 - 7.7 K/uL   Lymphocytes Relative 16 %   Lymphs Abs 1.1 0.7 - 4.0 K/uL   Monocytes Relative 15 %   Monocytes Absolute 1.0 0.1 - 1.0 K/uL   Eosinophils Relative 2 %   Eosinophils Absolute 0.2 0.0 - 0.5 K/uL   Basophils Relative 0 %   Basophils Absolute 0.0 0.0 - 0.1 K/uL   Immature Granulocytes 0 %   Abs Immature Granulocytes 0.02 0.00 - 0.07 K/uL  Comprehensive metabolic panel     Status: Abnormal   Collection Time: 07/01/23  8:40 PM  Result Value Ref Range   Sodium 133 (L) 135 - 145 mmol/L   Potassium 3.6 3.5 - 5.1 mmol/L   Chloride 93 (L) 98 - 111 mmol/L   CO2 22 22 - 32 mmol/L   Glucose, Bld 204 (H) 70 - 99 mg/dL   BUN 48 (H) 8 - 23 mg/dL   Creatinine, Ser 1.32 (H) 0.44 - 1.00 mg/dL   Calcium 9.4 8.9 - 44.0 mg/dL   Total Protein 6.9 6.5 - 8.1 g/dL   Albumin 3.3 (L) 3.5 - 5.0 g/dL   AST 52 (H) 15 - 41 U/L   ALT 48 (H) 0 - 44 U/L   Alkaline Phosphatase 64 38 - 126 U/L   Total Bilirubin 3.4 (H) 0.3 - 1.2 mg/dL   GFR, Estimated 51 (L) >60 mL/min   Anion gap 18 (H) 5 - 15  Ammonia     Status: Abnormal   Collection Time: 07/01/23  8:40 PM  Result Value Ref Range   Ammonia 103 (H) 9 - 35 umol/L  Ethanol     Status: None   Collection Time: 07/01/23  8:40 PM  Result Value Ref Range   Alcohol, Ethyl (B) <10 <10 mg/dL  Culture, blood (Routine X 2) w Reflex to ID Panel     Status: None (Preliminary result)   Collection Time: 07/02/23 12:10 AM   Specimen: BLOOD RIGHT ARM  Result Value Ref Range   Specimen Description      BLOOD RIGHT ARM BOTTLES DRAWN AEROBIC AND ANAEROBIC Performed at [facility], [address] [place], [place] [address]    Special Requests      Blood Culture adequate volume Performed at [facility], [address], [place], [place] [address]    Culture      NO GROWTH <12 HOURS Performed at [facility], [address] [place],  [place] [address]    Report Status PENDING   Culture, blood (Routine X 2) w Reflex to ID Panel     Status: None (Preliminary result)   Collection Time: 07/02/23 12:30 AM   Specimen: BLOOD LEFT HAND  Result Value Ref Range   Specimen Description      BLOOD LEFT HAND BOTTLES DRAWN AEROBIC AND ANAEROBIC Performed at [facility], [address], [place], [place] [address]    Special Requests      Blood Culture adequate volume Performed at [facility], [address], [place], [place] [address]    Culture      NO GROWTH <12 HOURS Performed at [facility], [address] [place], [place] [address]    Report Status PENDING   CBC     Status: Abnormal   Collection Time: 07/02/23 12:47 AM  Result Value Ref Range   WBC 6.2 4.0 - 10.5 K/uL   RBC 3.36 (L) 3.87 - 5.11 MIL/uL   Hemoglobin 9.5 (L) 12.0 - 15.0 g/dL   HCT 13.2 (L) 44.0 - 10.2 %   MCV 86.9 80.0 - 100.0 fL   MCH 28.3 26.0 - 34.0 pg   MCHC 32.5 30.0 - 36.0 g/dL   RDW 72.5 (H) 36.6 - 44.0 %   Platelets 100 (L) 150 - 400 K/uL   nRBC 0.0 0.0 - 0.2 %  Creatinine, serum     Status: None   Collection Time: 07/02/23 12:47 AM  Result Value Ref Range   Creatinine, Ser 0.99 0.44 - 1.00 mg/dL   GFR, Estimated >34 >74 mL/min  Comprehensive metabolic panel     Status: Abnormal   Collection Time: 07/02/23  6:40 AM  Result Value Ref Range   Sodium 130 (L) 135 - 145 mmol/L   Potassium 4.0 3.5 - 5.1 mmol/L   Chloride 96 (L) 98 - 111 mmol/L   CO2 20 (L) 22 - 32 mmol/L   Glucose, Bld 163 (H) 70 - 99 mg/dL   BUN 43 (H) 8 - 23 mg/dL   Creatinine, Ser 2.59 (H) 0.44 - 1.00 mg/dL   Calcium 8.8 (L) 8.9 - 10.3 mg/dL   Total Protein 6.6 6.5 - 8.1 g/dL   Albumin 3.1 (L) 3.5 - 5.0 g/dL   AST 48 (H) 15 - 41 U/L   ALT 46 (H) 0 - 44 U/L   Alkaline Phosphatase 62 38 - 126 U/L   Total Bilirubin 3.3 (H) 0.3 - 1.2 mg/dL   GFR, Estimated 58 (L) >60 mL/min   Anion gap 14 5 - 15  Protime-INR     Status: None    Collection Time: 07/02/23  6:40 AM  Result Value Ref Range   Prothrombin Time 14.8 11.4 - 15.2 seconds   INR 1.1 0.8 - 1.2  CBC with Differential/Platelet     Status: Abnormal   Collection Time: 07/02/23  6:40 AM  Result Value Ref Range   WBC 6.1 4.0 - 10.5 K/uL   RBC 3.46 (L) 3.87 - 5.11 MIL/uL   Hemoglobin 9.6 (L) 12.0 - 15.0 g/dL   HCT 56.3 (L) 87.5 - 64.3 %   MCV 87.0 80.0 - 100.0 fL   MCH 27.7 26.0 - 34.0 pg   MCHC 31.9 30.0 - 36.0 g/dL  RDW 15.9 (H) 11.5 - 15.5 %   Platelets 98 (L) 150 - 400 K/uL   nRBC 0.0 0.0 - 0.2 %   Neutrophils Relative % 65 %   Neutro Abs 4.0 1.7 - 7.7 K/uL   Lymphocytes Relative 18 %   Lymphs Abs 1.1 0.7 - 4.0 K/uL   Monocytes Relative 13 %   Monocytes Absolute 0.8 0.1 - 1.0 K/uL   Eosinophils Relative 3 %   Eosinophils Absolute 0.2 0.0 - 0.5 K/uL   Basophils Relative 1 %   Basophils Absolute 0.0 0.0 - 0.1 K/uL   Immature Granulocytes 0 %   Abs Immature Granulocytes 0.01 0.00 - 0.07 K/uL  Ammonia     Status: Abnormal   Collection Time: 07/02/23  7:42 AM  Result Value Ref Range   Ammonia 51 (H) 9 - 35 umol/L  Glucose, capillary     Status: Abnormal   Collection Time: 07/02/23 11:41 AM  Result Value Ref Range   Glucose-Capillary 146 (H) 70 - 99 mg/dL     DG CHEST PORT 1 VIEW  Result Date: 07/02/2023 CLINICAL DATA:  [address] with cough. EXAM: PORTABLE CHEST 1 VIEW COMPARISON:  Chest CT yesterday. FINDINGS: The lungs are clear with mild elevation of the right hemidiaphragm. There is no substantial pleural effusion. The cardiomediastinal silhouette and vascular pattern are normal. No new osseous findings. IMPRESSION: No active disease. Electronically Signed   By: Almira Bar M.D.   On: 07/02/2023 07:49   CT CHEST ABDOMEN PELVIS W CONTRAST  Result Date: 07/01/2023 CLINICAL DATA:  Nausea and vomiting, lethargic for 1 month, altered level of consciousness for 1 week, history of cirrhosis EXAM: CT CHEST, ABDOMEN, AND PELVIS WITH CONTRAST TECHNIQUE:  Multidetector CT imaging of the chest, abdomen and pelvis was performed following the standard protocol during bolus administration of intravenous contrast. RADIATION DOSE REDUCTION: This exam was performed according to the departmental dose-optimization program which includes automated exposure control, adjustment of the mA and/or kV according to patient size and/or use of iterative reconstruction technique. CONTRAST:  OMNIPAQUE IOHEXOL 300 MG/ML  SOLN COMPARISON:  07/29/2022 FINDINGS: CT CHEST FINDINGS Cardiovascular: The heart is unremarkable without pericardial effusion. Calcification of the aortic and mitral valves. No evidence of thoracic aortic aneurysm or dissection. Atherosclerosis of the aortic arch. Mediastinum/Nodes: Fluid-filled esophagus could reflect sequela of gastroesophageal reflux. Thyroid and trachea are unremarkable. No pathologic adenopathy. Lungs/Pleura: No airspace disease, effusion, or pneumothorax. Central airways are patent. Musculoskeletal: No acute or destructive bony abnormalities. Reconstructed images demonstrate no additional findings. CT ABDOMEN PELVIS FINDINGS Hepatobiliary: Cirrhosis. Indeterminate 1.2 cm hypodensity segment 4 B left lobe liver image 39/2, new since prior exam. Further evaluation with nonemergent outpatient liver MRI may be useful to exclude underlying neoplasm. Calcified gallstone without evidence of cholecystitis. No biliary duct dilation. Pancreas: Multilocular cystic lesion within the uncinate process of the pancreas measuring 4.0 x 3.9 cm, not appreciably changed since prior study. No evidence of acute pancreatitis. No pancreatic duct dilation. Spleen: Stable borderline splenomegaly, with spleen measuring up to 14.6 cm in AP dimension. No focal parenchymal abnormality. Adrenals/Urinary Tract: Stable appearance of the kidneys. No urinary tract calculi or obstructive uropathy. Adrenals and bladder are unremarkable. Stomach/Bowel: No bowel obstruction or  ileus. Diverticulosis of the distal descending and sigmoid colon without evidence of diverticulitis. Mild diffuse bowel wall thickening likely due to hypoproteinemic state and adjacent ascites. Distended fluid-filled stomach, with possible sequela of gastroesophageal reflux with fluid-filled thoracic esophagus as above. Vascular/Lymphatic: The splenic vein,  portal vein, and SMV are patent. Sequela of portal venous hypertension again noted, with marked splenic and gastroesophageal varices again identified. There is recanalization of the umbilical vein. No pathologic adenopathy. Reproductive: Status post hysterectomy. No adnexal masses. Other: Trace ascites within the lower abdomen and pelvis. No free intraperitoneal gas. Stable small umbilical hernia containing mesenteric fat and ascites. No bowel herniation. Musculoskeletal: No acute or destructive bony abnormalities. Reconstructed images demonstrate no additional findings. IMPRESSION: 1. Cirrhosis, with portal venous hypertension manifested by splenomegaly, ascites, and multiple varices as above. 2. New 1.2 cm hypodensity within segment IVb left lobe liver, indeterminate. Definitive characterization with nonemergent outpatient liver MRI may be useful to exclude neoplasm in this patient with chronic cirrhosis. 3. Stable multilocular cystic lesion within the uncinate process of the pancreas. Further evaluation with EUS and tissue sampling may be useful given size. 4. Distal colonic diverticulosis without diverticulitis. 5. Distended fluid-filled stomach, with fluid-filled thoracic esophagus likely due to gastroesophageal reflux. Electronically Signed   By: Sharlet Salina M.D.   On: 07/01/2023 22:25   CT HEAD WO CONTRAST ( )  Result Date: 07/01/2023 CLINICAL DATA:  Altered mental status EXAM: CT HEAD WITHOUT CONTRAST TECHNIQUE: Contiguous axial images were obtained from the base of the skull through the vertex without intravenous contrast. RADIATION DOSE  REDUCTION: This exam was performed according to the departmental dose-optimization program which includes automated exposure control, adjustment of the mA and/or kV according to patient size and/or use of iterative reconstruction technique. COMPARISON:  None Available. FINDINGS: Brain: There is no mass, hemorrhage or extra-axial collection. The size and configuration of the ventricles and extra-axial CSF spaces are normal. The brain parenchyma is normal, without acute or chronic infarction. 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 Vascular: No abnormal hyperdensity of the major intracranial arteries or dural venous sinuses. No intracranial atherosclerosis. Skull: The visualized skull base, calvarium and extracranial soft tissues are normal. Sinuses/Orbits: No fluid levels or advanced mucosal thickening of the visualized paranasal sinuses. No mastoid or middle ear effusion. The orbits are normal. IMPRESSION: 1. No acute intracranial abnormality. 2. 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 Electronically Signed   By: Deatra Robinson M.D.   On: 07/01/2023 22:07   DG Forearm Left  Result Date: 07/01/2023 CLINICAL DATA:  Fall EXAM: LEFT FOREARM - 2 VIEW COMPARISON:  None Available. FINDINGS: There is no evidence of fracture or other focal bone lesions. Soft tissues are unremarkable. IMPRESSION: Negative. Electronically Signed   By: Jasmine Pang M.D.   On: 07/01/2023 21:22    ROS:  As stated above in the HPI otherwise negative.  Blood pressure 128/77, pulse (!) 107, temperature 98.6 F (37 C), temperature source Oral, resp. rate 18, height 5\' 3"  (1.6 m), weight 75.8 kg, SpO2 100%.    PE: Gen: Sound asleep Lungs: CTA Bilaterally CV: RRR without M/G/R ABD: Soft, NTND, +BS Ext: No C/C/E  Assessment/Plan: 1) MASH cirrhosis.  MELD 3.0 - 18. 2) Hepatic encephalopathy. 3) New 1.2 cm lesion in the left lobe of the liver. 4) IPMN.   The patient was sound asleep and her husband was not present.  She  was provided with a dose of lactulose.  Yesterday she was confirmed to have asterixis.  Rifaximin was also started.  If this sleeping is secondary to HE, then she will need to have lactulose enemas.  Plan: 1) Reassess mental state in the AM. 2) If still somnolent, she will need a 300 ml lactulose enema. 3) Continue with oral lactulose  and Xifaxan for now. 4) [place] Dyer will assume care in the AM. , D 07/02/2023, 12:55 PM

## 2023-07-02 NOTE — ED Notes (Signed)
ED TO INPATIENT HANDOFF REPORT  ED Nurse Name and Phone #: Crist Infante, RN [phone]  S Name/Age/Gender Dana Dyer 72 y.o. female Room/Bed: WA13/WA13  Code Status   Code Status: Full Code  Home/SNF/Other Home Patient oriented to: self, place, and situation Is this baseline?  Normally A&Ox4  Triage Complete: Triage complete  Chief Complaint Hepatic encephalopathy (HCC) [K76.82]  Triage Note Pt BIBA from home w/ family complaints N/V, being lethargic x1 month and altered x1wk. Hx of non-alcoholic cirrhosis on lactulose. Pt A&O x3 w/ forgetfulness.   Allergies Allergies  Allergen Reactions   Amoxicillin-Pot Clavulanate     Severe diarrhea   Codeine Sulfate     REACTION: "hyper"   Diflucan [Fluconazole] Hives   Epinephrine Other (See Comments)    Racing heart rate   Erythromycin Base Diarrhea and Other (See Comments)    cramps   Invokana [Canagliflozin] Other (See Comments)    Recurrent yeast infections   Irbesartan-Hydrochlorothiazide Other (See Comments)    Unknown reaction   Victoza [Liraglutide] Nausea Only   Latex Rash    Long term use   Ozempic (0.25 Or 0.5 Mg-Dose) [Semaglutide(0.25 Or 0.5mg -Dos)] Nausea Only    Level of Care/Admitting Diagnosis ED Disposition     ED Disposition  Admit   Condition  --   Comment  Hospital Area: [facility] [[id number]]  Level of Care: Med-Surg [16]  May admit patient to [facility] or [facility] if equivalent level of care is available:: No  Covid Evaluation: Asymptomatic - no recent exposure (last 10 days) testing not required  Diagnosis: Hepatic encephalopathy (HCC) [572.2.ICD-9-CM]  Admitting Physician: Marinda Elk [[id number]]  Attending Physician: Marinda Elk [[id number]]  Certification:: I certify this patient will need inpatient services for at least 2 midnights  Estimated Length of Stay: 2          B Medical/Surgery History Past Medical History:  Diagnosis Date   ALLERGIC RHINITIS  10/09/2007   Allergy    Arthritis    hips, spine   DIABETES MELLITUS, UNCONTROLLED 03/16/2009   FATIGUE 10/21/2008   FIBROMYALGIA 10/09/2007   GERD (gastroesophageal reflux disease)    diet  controlled, no meds   H/O vaginal hysterectomy    HYPERLIPIDEMIA 10/09/2007   diet controlled, no meds   HYPERTENSION 10/09/2007   Morton's neuroma    OBSTRUCTIVE SLEEP APNEA 10/09/2007   Palpitations    Plantar fasciitis    History - right   Sleep apnea    Uses CPAP   TRANSAMINASES, SERUM, ELEVATED 07/08/2009   Past Surgical History:  Procedure Laterality Date   ABDOMINAL HYSTERECTOMY  1979   prolapsed uterus   BASAL CELL CARCINOMA EXCISION     BREAST BIOPSY Left 2012   left 2012 stereo   BREAST REDUCTION SURGERY Bilateral 1992   COLONOSCOPY  2006   [place]   mamoplasty     reduction   REDUCTION MAMMAPLASTY Bilateral 1992   TONSILLECTOMY  1959   TUBAL LIGATION  1976     A IV Location/Drains/Wounds Patient Lines/Drains/Airways Status     Active Line/Drains/Airways     Name Placement date Placement time Site Days   Peripheral IV 07/01/23 20 G Right Antecubital 07/01/23  2049  Antecubital  1   Peripheral IV 07/02/23 20 G Left Antecubital 07/02/23  0006  Antecubital  less than 1            Intake/Output Last 24 hours  Intake/Output Summary (Last 24 hours) at 07/02/2023 4782  Last data filed at 07/02/2023 0016 Gross per 24 hour  Intake 1041.49 ml  Output --  Net 1041.49 ml    Labs/Imaging Results for orders placed or performed during the hospital encounter of 07/01/23 (from the past 48 hour(s))  Urinalysis, Routine w reflex microscopic -Urine, Clean Catch     Status: Abnormal   Collection Time: 07/01/23  3:01 AM  Result Value Ref Range   Color, Urine YELLOW YELLOW   APPearance CLEAR CLEAR   Specific Gravity, Urine >1.046 (H) 1.005 - 1.030   pH 5.0 5.0 - 8.0   Glucose, UA NEGATIVE NEGATIVE mg/dL   Hgb urine dipstick NEGATIVE NEGATIVE   Bilirubin Urine NEGATIVE  NEGATIVE   Ketones, ur NEGATIVE NEGATIVE mg/dL   Protein, ur NEGATIVE NEGATIVE mg/dL   Nitrite NEGATIVE NEGATIVE   Leukocytes,Ua NEGATIVE NEGATIVE   RBC / HPF 0-5 0 - 5 RBC/hpf   WBC, UA 0-5 0 - 5 WBC/hpf   Bacteria, UA NONE SEEN NONE SEEN   Squamous Epithelial / HPF 0-5 0 - 5 /HPF    Comment: Performed at [facility], [address] [place], [place] [address]  CBG monitoring, ED     Status: Abnormal   Collection Time: 07/01/23  8:32 PM  Result Value Ref Range   Glucose-Capillary 197 (H) 70 - 99 mg/dL    Comment: Glucose reference range applies only to samples taken after fasting for at least 8 hours.  CBC with Differential     Status: Abnormal   Collection Time: 07/01/23  8:40 PM  Result Value Ref Range   WBC 7.0 4.0 - 10.5 K/uL   RBC 3.57 (L) 3.87 - 5.11 MIL/uL   Hemoglobin 10.1 (L) 12.0 - 15.0 g/dL   HCT 21.3 (L) 08.6 - 57.8 %   MCV 86.3 80.0 - 100.0 fL   MCH 28.3 26.0 - 34.0 pg   MCHC 32.8 30.0 - 36.0 g/dL   RDW 46.9 (H) 62.9 - 52.8 %   Platelets 114 (L) 150 - 400 K/uL   nRBC 0.0 0.0 - 0.2 %   Neutrophils Relative % 67 %   Neutro Abs 4.7 1.7 - 7.7 K/uL   Lymphocytes Relative 16 %   Lymphs Abs 1.1 0.7 - 4.0 K/uL   Monocytes Relative 15 %   Monocytes Absolute 1.0 0.1 - 1.0 K/uL   Eosinophils Relative 2 %   Eosinophils Absolute 0.2 0.0 - 0.5 K/uL   Basophils Relative 0 %   Basophils Absolute 0.0 0.0 - 0.1 K/uL   Immature Granulocytes 0 %   Abs Immature Granulocytes 0.02 0.00 - 0.07 K/uL    Comment: Performed at [facility], [address] [place], [place] [address]  Comprehensive metabolic panel     Status: Abnormal   Collection Time: 07/01/23  8:40 PM  Result Value Ref Range   Sodium 133 (L) 135 - 145 mmol/L   Potassium 3.6 3.5 - 5.1 mmol/L   Chloride 93 (L) 98 - 111 mmol/L   CO2 22 22 - 32 mmol/L   Glucose, Bld 204 (H) 70 - 99 mg/dL    Comment: Glucose reference range applies only to samples taken after fasting for at least  8 hours.   BUN 48 (H) 8 - 23 mg/dL   Creatinine, Ser 4.01 (H) 0.44 - 1.00 mg/dL   Calcium 9.4 8.9 - 02.7 mg/dL   Total Protein 6.9 6.5 - 8.1 g/dL   Albumin 3.3 (L) 3.5 - 5.0 g/dL  AST 52 (H) 15 - 41 U/L   ALT 48 (H) 0 - 44 U/L   Alkaline Phosphatase 64 38 - 126 U/L   Total Bilirubin 3.4 (H) 0.3 - 1.2 mg/dL   GFR, Estimated 51 (L) >60 mL/min    Comment: (NOTE) Calculated using the CKD-EPI Creatinine Equation (2021)    Anion gap 18 (H) 5 - 15    Comment: Performed at [facility], [address], [place], [place] [address]  Ammonia     Status: Abnormal   Collection Time: 07/01/23  8:40 PM  Result Value Ref Range   Ammonia 103 (H) 9 - 35 umol/L    Comment: Performed at [facility], [address], [place], [place] [address]  Ethanol     Status: None   Collection Time: 07/01/23  8:40 PM  Result Value Ref Range   Alcohol, Ethyl (B) <10 <10 mg/dL    Comment: (NOTE) Lowest detectable limit for serum alcohol is 10 mg/dL.  For medical purposes only. Performed at [facility], [address] [place], [place] [address]   CBC     Status: Abnormal   Collection Time: 07/02/23 12:47 AM  Result Value Ref Range   WBC 6.2 4.0 - 10.5 K/uL   RBC 3.36 (L) 3.87 - 5.11 MIL/uL   Hemoglobin 9.5 (L) 12.0 - 15.0 g/dL   HCT 75.6 (L) 43.3 - 29.5 %   MCV 86.9 80.0 - 100.0 fL   MCH 28.3 26.0 - 34.0 pg   MCHC 32.5 30.0 - 36.0 g/dL   RDW 18.8 (H) 41.6 - 60.6 %   Platelets 100 (L) 150 - 400 K/uL   nRBC 0.0 0.0 - 0.2 %    Comment: Performed at [facility], [address], [place], [place] [address]  Creatinine, serum     Status: None   Collection Time: 07/02/23 12:47 AM  Result Value Ref Range   Creatinine, Ser 0.99 0.44 - 1.00 mg/dL   GFR, Estimated >10 >93 mL/min    Comment: (NOTE) Calculated using the CKD-EPI Creatinine Equation (2021) Performed at [facility], [address] [place],  [place] [address]   Protime-INR     Status: None   Collection Time: 07/02/23  6:40 AM  Result Value Ref Range   Prothrombin Time 14.8 11.4 - 15.2 seconds   INR 1.1 0.8 - 1.2    Comment: (NOTE) INR goal varies based on device and disease states. Performed at [facility], [address], [place], [place] [address]    CT CHEST ABDOMEN PELVIS W CONTRAST  Result Date: 07/01/2023 CLINICAL DATA:  Nausea and vomiting, lethargic for 1 month, altered level of consciousness for 1 week, history of cirrhosis EXAM: CT CHEST, ABDOMEN, AND PELVIS WITH CONTRAST TECHNIQUE: Multidetector CT imaging of the chest, abdomen and pelvis was performed following the standard protocol during bolus administration of intravenous contrast. RADIATION DOSE REDUCTION: This exam was performed according to the departmental dose-optimization program which includes automated exposure control, adjustment of the mA and/or kV according to patient size and/or use of iterative reconstruction technique. CONTRAST:  OMNIPAQUE IOHEXOL 300 MG/ML  SOLN COMPARISON:  07/29/2022 FINDINGS: CT CHEST FINDINGS Cardiovascular: The heart is unremarkable without pericardial effusion. Calcification of the aortic and mitral valves. No evidence of thoracic aortic aneurysm or dissection. Atherosclerosis of the aortic arch. Mediastinum/Nodes: Fluid-filled esophagus could reflect sequela of gastroesophageal reflux. Thyroid and trachea are unremarkable. No pathologic adenopathy. Lungs/Pleura:  No airspace disease, effusion, or pneumothorax. Central airways are patent. Musculoskeletal: No acute or destructive bony abnormalities. Reconstructed images demonstrate no additional findings. CT ABDOMEN PELVIS FINDINGS Hepatobiliary: Cirrhosis. Indeterminate 1.2 cm hypodensity segment 4 B left lobe liver image 39/2, new since prior exam. Further evaluation with nonemergent outpatient liver MRI may be useful to exclude underlying neoplasm. Calcified gallstone  without evidence of cholecystitis. No biliary duct dilation. Pancreas: Multilocular cystic lesion within the uncinate process of the pancreas measuring 4.0 x 3.9 cm, not appreciably changed since prior study. No evidence of acute pancreatitis. No pancreatic duct dilation. Spleen: Stable borderline splenomegaly, with spleen measuring up to 14.6 cm in AP dimension. No focal parenchymal abnormality. Adrenals/Urinary Tract: Stable appearance of the kidneys. No urinary tract calculi or obstructive uropathy. Adrenals and bladder are unremarkable. Stomach/Bowel: No bowel obstruction or ileus. Diverticulosis of the distal descending and sigmoid colon without evidence of diverticulitis. Mild diffuse bowel wall thickening likely due to hypoproteinemic state and adjacent ascites. Distended fluid-filled stomach, with possible sequela of gastroesophageal reflux with fluid-filled thoracic esophagus as above. Vascular/Lymphatic: The splenic vein, portal vein, and SMV are patent. Sequela of portal venous hypertension again noted, with marked splenic and gastroesophageal varices again identified. There is recanalization of the umbilical vein. No pathologic adenopathy. Reproductive: Status post hysterectomy. No adnexal masses. Other: Trace ascites within the lower abdomen and pelvis. No free intraperitoneal gas. Stable small umbilical hernia containing mesenteric fat and ascites. No bowel herniation. Musculoskeletal: No acute or destructive bony abnormalities. Reconstructed images demonstrate no additional findings. IMPRESSION: 1. Cirrhosis, with portal venous hypertension manifested by splenomegaly, ascites, and multiple varices as above. 2. New 1.2 cm hypodensity within segment IVb left lobe liver, indeterminate. Definitive characterization with nonemergent outpatient liver MRI may be useful to exclude neoplasm in this patient with chronic cirrhosis. 3. Stable multilocular cystic lesion within the uncinate process of the pancreas.  Further evaluation with EUS and tissue sampling may be useful given size. 4. Distal colonic diverticulosis without diverticulitis. 5. Distended fluid-filled stomach, with fluid-filled thoracic esophagus likely due to gastroesophageal reflux. Electronically Signed   By: Sharlet Salina M.D.   On: 07/01/2023 22:25   CT HEAD WO CONTRAST ( )  Result Date: 07/01/2023 CLINICAL DATA:  Altered mental status EXAM: CT HEAD WITHOUT CONTRAST TECHNIQUE: Contiguous axial images were obtained from the base of the skull through the vertex without intravenous contrast. RADIATION DOSE REDUCTION: This exam was performed according to the departmental dose-optimization program which includes automated exposure control, adjustment of the mA and/or kV according to patient size and/or use of iterative reconstruction technique. COMPARISON:  None Available. FINDINGS: Brain: There is no mass, hemorrhage or extra-axial collection. The size and configuration of the ventricles and extra-axial CSF spaces are normal. The brain parenchyma is normal, without acute or chronic infarction. 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 Vascular: No abnormal hyperdensity of the major intracranial arteries or dural venous sinuses. No intracranial atherosclerosis. Skull: The visualized skull base, calvarium and extracranial soft tissues are normal. Sinuses/Orbits: No fluid levels or advanced mucosal thickening of the visualized paranasal sinuses. No mastoid or middle ear effusion. The orbits are normal. IMPRESSION: 1. No acute intracranial abnormality. 2. 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 Electronically Signed   By: Deatra Robinson M.D.   On: 07/01/2023 22:07   DG Forearm Left  Result Date: 07/01/2023 CLINICAL DATA:  Fall EXAM: LEFT FOREARM - 2 VIEW COMPARISON:  None Available. FINDINGS: There is no evidence of fracture or other focal bone  lesions. Soft tissues are unremarkable. IMPRESSION: Negative. Electronically Signed   By: Jasmine Pang M.D.   On: 07/01/2023 21:22    Pending Labs Unresulted Labs (From admission, onward)     Start     Ordered   07/08/23 0500  Creatinine, serum  (enoxaparin (LOVENOX)    CrCl >/= 30 ml/min)  Weekly,   R     Comments: while on enoxaparin therapy    07/01/23 2347   07/02/23 0701  Ammonia  Once,   R        07/02/23 0700   07/02/23 0648  CBC with Differential/Platelet  Once,   R        07/02/23 0648   07/02/23 0500  Comprehensive metabolic panel  Tomorrow morning,   R        07/01/23 2347   07/01/23 2344  Culture, blood (Routine X 2) w Reflex to ID Panel  BLOOD CULTURE X 2,   R (with STAT occurrences)      07/01/23 2344            Vitals/Pain Today's Vitals   07/01/23 2330 07/02/23 0101 07/02/23 0401 07/02/23 0546  BP: 101/74 133/79 132/69   Pulse: 91 (!) 102 (!) 106   Resp: 12 15 13    Temp:  98.4 F (36.9 C)  98.2 F (36.8 C)  TempSrc:  Oral  Oral  SpO2: 96% 98% 96%   Weight:      Height:      PainSc:        Isolation Precautions No active isolations  Medications Medications  rifaximin (XIFAXAN) tablet 550 mg (550 mg Oral Given 07/02/23 0019)  escitalopram (LEXAPRO) tablet 5 mg (has no administration in time range)  pantoprazole (PROTONIX) EC tablet 40 mg (has no administration in time range)  insulin aspart (novoLOG) injection 0-24 Units (has no administration in time range)  acetaminophen (TYLENOL) tablet 650 mg (has no administration in time range)    Or  acetaminophen (TYLENOL) suppository 650 mg (has no administration in time range)  ondansetron (ZOFRAN) injection 4 mg (has no administration in time range)  0.9 %  sodium chloride infusion ( Intravenous New Bag/Given 07/02/23 0734)  lactulose (CHRONULAC) 10 GM/15ML solution 20 g (has no administration in time range)  cefTRIAXone (ROCEPHIN) 1 g in sodium chloride 0.9 % 100 mL IVPB (has no administration in time range)  metroNIDAZOLE (FLAGYL) IVPB 500 mg (500 mg Intravenous New Bag/Given 07/02/23 0739)   insulin detemir (LEVEMIR) injection 10 Units (has no administration in time range)  sodium chloride 0.9 % bolus 1,000 mL (0 mLs Intravenous Stopped 07/01/23 2339)  ondansetron (ZOFRAN) injection 4 mg (4 mg Intravenous Given 07/01/23 2058)  iohexol (OMNIPAQUE) 300 MG/ML solution 100 mL (100 mLs Intravenous Contrast Given 07/01/23 2145)  pantoprazole (PROTONIX) injection 40 mg (40 mg Intravenous Given 07/01/23 2322)  lactulose (CHRONULAC) 10 GM/15ML solution 30 g (30 g Oral Given 07/01/23 2339)  ondansetron (ZOFRAN) injection 4 mg (4 mg Intravenous Given 07/02/23 1610)    Mobility walks with person assist     Focused Assessments Neuro Assessment Handoff:  Swallow screen pass?  N/A         Neuro Assessment: Within Defined Limits Neuro Checks:      Has TPA been given? No If patient is a Neuro Trauma and patient is going to OR before floor call report to 4N Charge nurse: [phone] or [phone]   R Recommendations: See Admitting Provider Note  Report given to:  Additional Notes:

## 2023-07-02 NOTE — Progress Notes (Addendum)
[facility] PROGRESS NOTE    Progress Note  Dana Dyer  ZOX:[id number] DOB: 29-Dec-1950 DOA: 07/01/2023 PCP: Karie Georges, MD     Brief Narrative:   Dana Dyer is an 72 y.o. female past medical history significant for decompensated Elita Boone cirrhosis, GERD essential hypertension comes in with altered mental status and a fall, husband provides most of the history he relates that for the last several weeks she has been having episodes of confusion, CT scan of the abdomen pelvis shows cirrhosis with portal hypertension splenomegaly ascites and multiple varices and new 1.2 cm hypodensity in the left liver lobe.CT of the head showed no acute findings   Assessment/Plan:   Decompensated cirrhosis/hepatic encephalopathy : No clear precipitating factors.  Her mentation is improved she is alert and oriented x 3 Blood cultures were obtained. The scan of the abdomen pelvis shows minimal ascites,  Discontinue antibiotics Had multiple bowel movements had an episode of vomiting, with decreased her lactulose this morning, continue rifaximin.  Keep her n.p.o. for now . Specific gravity on UA is 1024 appears dry on physical exam. Will start her on IV fluids discontinue diuretic therapy. Monitor strict I's and O's and daily weights.  Hypovolemic hyponatremia: Appears dry on physical exam will start on IV fluids hold diuretic therapy. Recheck basic metabolic panel in the morning.  Acute kidney injury: With a baseline creatinine around 0.8 on admission 1.2. Likely prerenal azotemia from decreased oral intake. Got a liter of normal saline and her creatinine is slowly improving we will continue IV fluids and recheck basic metabolic panel in the morning.  Tachycardia: She just had an episode of vomiting and is tachycardic. Will get a chest x-ray, showed no acute findings.  Normocytic anemia: Husband relates no signs of bleeding hemoglobin is around baseline. Will recheck tomorrow  after hydration.  GERD: Continue PPI.  Depression:  Lexapro.  Controlled type 2 diabetes mellitus without complication, without long-term current use of insulin (HCC): Currently n.p.o. for now continue sliding scale insulin, continue CBGs every 4 hours. Hemoglobin A1c is 5.8  Chronic thrombocytopenia (HCC) Platelets are relatively stable compared to previous labs, continue to monitor.   DVT prophylaxis: lovenox Family Communication: Husband Status is: Observation The patient will require care spanning > 2 midnights and should be moved to inpatient because: Acute metabolic encephalopathy of unclear source    Code Status:     Code Status Orders  (From admission, onward)           Start     Ordered   07/01/23 2347  Full code  Continuous       Question:  By:  Answer:  Consent: discussion documented in EHR   07/01/23 2347           Code Status History     Date Active Date Inactive Code Status Order ID Comments User Context   07/30/2022 0415 08/01/2022 1820 Full Code [id number]  Darlin Drop, DO Inpatient         IV Access:   Peripheral IV   Procedures and diagnostic studies:   CT CHEST ABDOMEN PELVIS W CONTRAST  Result Date: 07/01/2023 CLINICAL DATA:  Nausea and vomiting, lethargic for 1 month, altered level of consciousness for 1 week, history of cirrhosis EXAM: CT CHEST, ABDOMEN, AND PELVIS WITH CONTRAST TECHNIQUE: Multidetector CT imaging of the chest, abdomen and pelvis was performed following the standard protocol during bolus administration of intravenous contrast. RADIATION DOSE REDUCTION: This exam was performed according to the  departmental dose-optimization program which includes automated exposure control, adjustment of the mA and/or kV according to patient size and/or use of iterative reconstruction technique. CONTRAST:  OMNIPAQUE IOHEXOL 300 MG/ML  SOLN COMPARISON:  07/29/2022 FINDINGS: CT CHEST FINDINGS Cardiovascular: The heart is  unremarkable without pericardial effusion. Calcification of the aortic and mitral valves. No evidence of thoracic aortic aneurysm or dissection. Atherosclerosis of the aortic arch. Mediastinum/Nodes: Fluid-filled esophagus could reflect sequela of gastroesophageal reflux. Thyroid and trachea are unremarkable. No pathologic adenopathy. Lungs/Pleura: No airspace disease, effusion, or pneumothorax. Central airways are patent. Musculoskeletal: No acute or destructive bony abnormalities. Reconstructed images demonstrate no additional findings. CT ABDOMEN PELVIS FINDINGS Hepatobiliary: Cirrhosis. Indeterminate 1.2 cm hypodensity segment 4 B left lobe liver image 39/2, new since prior exam. Further evaluation with nonemergent outpatient liver MRI may be useful to exclude underlying neoplasm. Calcified gallstone without evidence of cholecystitis. No biliary duct dilation. Pancreas: Multilocular cystic lesion within the uncinate process of the pancreas measuring 4.0 x 3.9 cm, not appreciably changed since prior study. No evidence of acute pancreatitis. No pancreatic duct dilation. Spleen: Stable borderline splenomegaly, with spleen measuring up to 14.6 cm in AP dimension. No focal parenchymal abnormality. Adrenals/Urinary Tract: Stable appearance of the kidneys. No urinary tract calculi or obstructive uropathy. Adrenals and bladder are unremarkable. Stomach/Bowel: No bowel obstruction or ileus. Diverticulosis of the distal descending and sigmoid colon without evidence of diverticulitis. Mild diffuse bowel wall thickening likely due to hypoproteinemic state and adjacent ascites. Distended fluid-filled stomach, with possible sequela of gastroesophageal reflux with fluid-filled thoracic esophagus as above. Vascular/Lymphatic: The splenic vein, portal vein, and SMV are patent. Sequela of portal venous hypertension again noted, with marked splenic and gastroesophageal varices again identified. There is recanalization of the  umbilical vein. No pathologic adenopathy. Reproductive: Status post hysterectomy. No adnexal masses. Other: Trace ascites within the lower abdomen and pelvis. No free intraperitoneal gas. Stable small umbilical hernia containing mesenteric fat and ascites. No bowel herniation. Musculoskeletal: No acute or destructive bony abnormalities. Reconstructed images demonstrate no additional findings. IMPRESSION: 1. Cirrhosis, with portal venous hypertension manifested by splenomegaly, ascites, and multiple varices as above. 2. New 1.2 cm hypodensity within segment IVb left lobe liver, indeterminate. Definitive characterization with nonemergent outpatient liver MRI may be useful to exclude neoplasm in this patient with chronic cirrhosis. 3. Stable multilocular cystic lesion within the uncinate process of the pancreas. Further evaluation with EUS and tissue sampling may be useful given size. 4. Distal colonic diverticulosis without diverticulitis. 5. Distended fluid-filled stomach, with fluid-filled thoracic esophagus likely due to gastroesophageal reflux. Electronically Signed   By: Sharlet Salina M.D.   On: 07/01/2023 22:25   CT HEAD WO CONTRAST ( )  Result Date: 07/01/2023 CLINICAL DATA:  Altered mental status EXAM: CT HEAD WITHOUT CONTRAST TECHNIQUE: Contiguous axial images were obtained from the base of the skull through the vertex without intravenous contrast. RADIATION DOSE REDUCTION: This exam was performed according to the departmental dose-optimization program which includes automated exposure control, adjustment of the mA and/or kV according to patient size and/or use of iterative reconstruction technique. COMPARISON:  None Available. FINDINGS: Brain: There is no mass, hemorrhage or extra-axial collection. The size and configuration of the ventricles and extra-axial CSF spaces are normal. The brain parenchyma is normal, without acute or chronic infarction. 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  Vascular: No abnormal hyperdensity of the major intracranial arteries or dural venous sinuses. No intracranial atherosclerosis. Skull: The visualized skull base, calvarium and extracranial soft tissues  are normal. Sinuses/Orbits: No fluid levels or advanced mucosal thickening of the visualized paranasal sinuses. No mastoid or middle ear effusion. The orbits are normal. IMPRESSION: 1. No acute intracranial abnormality. 2. 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 Electronically Signed   By: Deatra Robinson M.D.   On: 07/01/2023 22:07   DG Forearm Left  Result Date: 07/01/2023 CLINICAL DATA:  Fall EXAM: LEFT FOREARM - 2 VIEW COMPARISON:  None Available. FINDINGS: There is no evidence of fracture or other focal bone lesions. Soft tissues are unremarkable. IMPRESSION: Negative. Electronically Signed   By: Jasmine Pang M.D.   On: 07/01/2023 21:22     Medical Consultants:   None.   Subjective:    Dana Dyer relates she feels better, she also had a bowel movement  Objective:    Vitals:   07/01/23 2330 07/02/23 0101 07/02/23 0401 07/02/23 0546  BP: 101/74 133/79 132/69   Pulse: 91 (!) 102 (!) 106   Resp: 12 15 13    Temp:  98.4 F (36.9 C)  98.2 F (36.8 C)  TempSrc:  Oral  Oral  SpO2: 96% 98% 96%   Weight:      Height:       SpO2: 96 %   Intake/Output Summary (Last 24 hours) at 07/02/2023 0640 Last data filed at 07/02/2023 0016 Gross per 24 hour  Intake 1041.49 ml  Output --  Net 1041.49 ml   Filed Weights   07/01/23 2034  Weight: 75.8 kg    Exam: General exam: In no acute distress, mucous membranes are dry Respiratory system: Good air movement and clear to auscultation. Cardiovascular system: S1 & S2 heard, RRR. No JVD.  Gastrointestinal system: Abdomen is nondistended, soft and nontender.  Central nervous system: Alert and oriented x 3 no apparent focal deficits Extremities: No pedal edema. Skin: No rashes, lesions or ulcers Psychiatry: Just and insight appear  intact   Data Reviewed:    Labs: Basic Metabolic Panel: Recent Labs  Lab 07/01/23 2040 07/02/23 0047  NA 133*  --   K 3.6  --   CL 93*  --   CO2 22  --   GLUCOSE 204*  --   BUN 48*  --   CREATININE 1.13* 0.99  CALCIUM 9.4  --    GFR Estimated Creatinine Clearance: 49.4 mL/min (by C-G formula based on SCr of 0.99 mg/dL). Liver Function Tests: Recent Labs  Lab 07/01/23 2040  AST 52*  ALT 48*  ALKPHOS 64  BILITOT 3.4*  PROT 6.9  ALBUMIN 3.3*   No results for input(s): "LIPASE", "AMYLASE" in the last 168 hours. Recent Labs  Lab 07/01/23 2040  AMMONIA 103*   Coagulation profile No results for input(s): "INR", "PROTIME" in the last 168 hours. COVID-19 Labs  No results for input(s): "DDIMER", "FERRITIN", "LDH", "CRP" in the last 72 hours.  No results found for: "SARSCOV2NAA"  CBC: Recent Labs  Lab 07/01/23 2040 07/02/23 0047  WBC 7.0 6.2  NEUTROABS 4.7  --   HGB 10.1* 9.5*  HCT 30.8* 29.2*  MCV 86.3 86.9  PLT 114* 100*   Cardiac Enzymes: No results for input(s): "CKTOTAL", "CKMB", "CKMBINDEX", "TROPONINI" in the last 168 hours. BNP (last 3 results) No results for input(s): "PROBNP" in the last 8760 hours. CBG: Recent Labs  Lab 07/01/23 2032  GLUCAP 197*   D-Dimer: No results for input(s): "DDIMER" in the last 72 hours. Hgb A1c: No results for input(s): "HGBA1C" in the last 72 hours. Lipid Profile: No  results for input(s): "CHOL", "HDL", "LDLCALC", "TRIG", "CHOLHDL", "LDLDIRECT" in the last 72 hours. Thyroid function studies: No results for input(s): "TSH", "T4TOTAL", "T3FREE", "THYROIDAB" in the last 72 hours.  Invalid input(s): "FREET3" Anemia work up: No results for input(s): "VITAMINB12", "FOLATE", "FERRITIN", "TIBC", "IRON", "RETICCTPCT" in the last 72 hours. Sepsis Labs: Recent Labs  Lab 07/01/23 2040 07/02/23 0047  WBC 7.0 6.2   Microbiology No results found for this or any previous visit (from the past 240  hour(s)).   Medications:    escitalopram  5 mg Oral Daily   furosemide  20 mg Oral Daily   insulin aspart  0-24 Units Subcutaneous TID WC   insulin detemir  20 Units Subcutaneous Daily   lactulose  30 g Oral Q4H   lactulose  300 mL Rectal Once   pantoprazole  40 mg Oral Daily   rifaximin  550 mg Oral BID   spironolactone  50 mg Oral Daily   Continuous Infusions:    LOS: 0 days   Marinda Elk  [facility]  07/02/2023, 6:40 AM

## 2023-07-03 ENCOUNTER — Telehealth: Payer: Self-pay | Admitting: Gastroenterology

## 2023-07-03 ENCOUNTER — Telehealth: Payer: Self-pay | Admitting: Internal Medicine

## 2023-07-03 DIAGNOSIS — K7581 Nonalcoholic steatohepatitis (NASH): Secondary | ICD-10-CM | POA: Diagnosis not present

## 2023-07-03 DIAGNOSIS — D649 Anemia, unspecified: Secondary | ICD-10-CM | POA: Diagnosis not present

## 2023-07-03 DIAGNOSIS — Z9189 Other specified personal risk factors, not elsewhere classified: Secondary | ICD-10-CM

## 2023-07-03 DIAGNOSIS — E119 Type 2 diabetes mellitus without complications: Secondary | ICD-10-CM | POA: Diagnosis not present

## 2023-07-03 DIAGNOSIS — K7682 Hepatic encephalopathy: Secondary | ICD-10-CM | POA: Diagnosis not present

## 2023-07-03 DIAGNOSIS — N179 Acute kidney failure, unspecified: Secondary | ICD-10-CM | POA: Diagnosis not present

## 2023-07-03 DIAGNOSIS — D696 Thrombocytopenia, unspecified: Secondary | ICD-10-CM | POA: Diagnosis not present

## 2023-07-03 DIAGNOSIS — K7689 Other specified diseases of liver: Secondary | ICD-10-CM | POA: Diagnosis not present

## 2023-07-03 LAB — COMPREHENSIVE METABOLIC PANEL
ALT: 42 U/L (ref 0–44)
AST: 43 U/L — ABNORMAL HIGH (ref 15–41)
Albumin: 2.6 g/dL — ABNORMAL LOW (ref 3.5–5.0)
Alkaline Phosphatase: 50 U/L (ref 38–126)
Anion gap: 10 (ref 5–15)
BUN: 32 mg/dL — ABNORMAL HIGH (ref 8–23)
CO2: 22 mmol/L (ref 22–32)
Calcium: 8.7 mg/dL — ABNORMAL LOW (ref 8.9–10.3)
Chloride: 106 mmol/L (ref 98–111)
Creatinine, Ser: 0.9 mg/dL (ref 0.44–1.00)
GFR, Estimated: >60 mL/min (ref >60)
Glucose, Bld: 129 mg/dL — ABNORMAL HIGH (ref 70–99)
Potassium: 3.7 mmol/L (ref 3.5–5.1)
Sodium: 138 mmol/L (ref 135–145)
Total Bilirubin: 3 mg/dL — ABNORMAL HIGH (ref 0.3–1.2)
Total Protein: 5.3 g/dL — ABNORMAL LOW (ref 6.5–8.1)

## 2023-07-03 LAB — GLUCOSE, CAPILLARY
Glucose-Capillary: 108 mg/dL — ABNORMAL HIGH (ref 70–99)
Glucose-Capillary: 168 mg/dL — ABNORMAL HIGH (ref 70–99)
Glucose-Capillary: 179 mg/dL — ABNORMAL HIGH (ref 70–99)
Glucose-Capillary: 151 mg/dL — ABNORMAL HIGH (ref 70–99)

## 2023-07-03 MED ORDER — INSULIN ASPART 100 UNIT/ML IJ SOLN: 4.0000 [IU] | Freq: Three times a day (TID) | INTRAMUSCULAR | Status: DC

## 2023-07-03 MED ORDER — METOPROLOL TARTRATE 12.5 MG HALF TABLET
12.5000 mg | ORAL_TABLET | Freq: Two times a day (BID) | ORAL | Status: DC
  Administered 2023-07-03 – 2023-07-05 (×5): 12.5 mg via ORAL
  Filled 2023-07-03 (×5): qty 1

## 2023-07-03 MED ORDER — INSULIN ASPART 100 UNIT/ML IJ SOLN
0.0000 [IU] | Freq: Three times a day (TID) | INTRAMUSCULAR | Status: DC
  Administered 2023-07-03 – 2023-07-04 (×4): 2 [IU] via SUBCUTANEOUS
  Administered 2023-07-05: 1 [IU] via SUBCUTANEOUS

## 2023-07-03 MED ORDER — INSULIN ASPART 100 UNIT/ML IJ SOLN
4.0000 [IU] | Freq: Three times a day (TID) | INTRAMUSCULAR | Status: DC
  Administered 2023-07-03 – 2023-07-05 (×7): 4 [IU] via SUBCUTANEOUS

## 2023-07-03 MED ORDER — INSULIN ASPART 100 UNIT/ML IJ SOLN: 0.0000 [IU] | Freq: Three times a day (TID) | INTRAMUSCULAR | Status: DC

## 2023-07-03 MED ORDER — INSULIN ASPART 100 UNIT/ML IJ SOLN: 0.0000 [IU] | Freq: Every day | INTRAMUSCULAR | Status: DC

## 2023-07-03 MED ORDER — INSULIN GLARGINE-YFGN 100 UNIT/ML ~~LOC~~ SOLN
15.0000 [IU] | Freq: Two times a day (BID) | SUBCUTANEOUS | Status: DC
  Administered 2023-07-03 – 2023-07-05 (×5): 15 [IU] via SUBCUTANEOUS
  Filled 2023-07-03 (×6): qty 0.15

## 2023-07-03 MED ORDER — SPIRONOLACTONE 25 MG PO TABS
50.0000 mg | ORAL_TABLET | Freq: Every day | ORAL | Status: DC
  Administered 2023-07-03 – 2023-07-05 (×3): 50 mg via ORAL
  Filled 2023-07-03 (×3): qty 2

## 2023-07-03 NOTE — Progress Notes (Signed)
[facility] PROGRESS NOTE    Progress Note  Dana Dyer  ZOX:[id number] DOB: August 05, 1951 DOA: 07/01/2023 PCP: Karie Georges, MD     Brief Narrative:   Dana Dyer is an 72 y.o. female past medical history significant for decompensated Elita Boone cirrhosis, GERD essential hypertension comes in with altered mental status and a fall, husband provides most of the history he relates that for the last several weeks she has been having episodes of confusion, CT scan of the abdomen pelvis shows cirrhosis with portal hypertension splenomegaly ascites and multiple varices and new 1.2 cm hypodensity in the left liver lobe.CT of the head showed no acute findings   Assessment/Plan:   Decompensated cirrhosis/hepatic encephalopathy : No clear precipitating factors.  Her mentation is improved she is alert and oriented x 3 Blood cultures were obtained and have remained negative till date. Her mentation is back to baseline she is hungry and would like to eat Low-Sodium Diet. Monitor strict I's and O's and daily weights.  Hypovolemic hyponatremia: Resolved Allow a diet. Start Aldactone.  Acute kidney injury: With a baseline creatinine around 0.8 on admission 1.2. Likely prerenal azotemia from decreased oral intake. Resolved with IV fluids.  Tachycardia: Likely due to holding metoprolol resume oral metoprolol.  Normocytic anemia: Husband relates no signs of bleeding hemoglobin is around baseline. Will recheck tomorrow after hydration.  GERD: Continue PPI.  Depression:  Lexapro.  Controlled type 2 diabetes mellitus without complication, without long-term current use of insulin (HCC): Allow a diet started back on her home dose of long-acting insulin, can restart sliding scale insulin.  Chronic thrombocytopenia (HCC) Platelets are relatively stable compared to previous labs, continue to monitor.   DVT prophylaxis: lovenox Family Communication: Husband Status is:  Observation The patient will require care spanning > 2 midnights and should be moved to inpatient because: Acute metabolic encephalopathy of unclear source    Code Status:     Code Status Orders  (From admission, onward)           Start     Ordered   07/01/23 2347  Full code  Continuous       Question:  By:  Answer:  Consent: discussion documented in EHR   07/01/23 2347           Code Status History     Date Active Date Inactive Code Status Order ID Comments User Context   07/30/2022 0415 08/01/2022 1820 Full Code [id number]  Darlin Drop, DO Inpatient         IV Access:   Peripheral IV   Procedures and diagnostic studies:   DG CHEST PORT 1 VIEW  Result Date: 07/02/2023 CLINICAL DATA:  10031 with cough. EXAM: PORTABLE CHEST 1 VIEW COMPARISON:  Chest CT yesterday. FINDINGS: The lungs are clear with mild elevation of the right hemidiaphragm. There is no substantial pleural effusion. The cardiomediastinal silhouette and vascular pattern are normal. No new osseous findings. IMPRESSION: No active disease. Electronically Signed   By: Almira Bar M.D.   On: 07/02/2023 07:49   CT CHEST ABDOMEN PELVIS W CONTRAST  Result Date: 07/01/2023 CLINICAL DATA:  Nausea and vomiting, lethargic for 1 month, altered level of consciousness for 1 week, history of cirrhosis EXAM: CT CHEST, ABDOMEN, AND PELVIS WITH CONTRAST TECHNIQUE: Multidetector CT imaging of the chest, abdomen and pelvis was performed following the standard protocol during bolus administration of intravenous contrast. RADIATION DOSE REDUCTION: This exam was performed according to the departmental dose-optimization program which includes  automated exposure control, adjustment of the mA and/or kV according to patient size and/or use of iterative reconstruction technique. CONTRAST:  OMNIPAQUE IOHEXOL 300 MG/ML  SOLN COMPARISON:  07/29/2022 FINDINGS: CT CHEST FINDINGS Cardiovascular: The heart is unremarkable without  pericardial effusion. Calcification of the aortic and mitral valves. No evidence of thoracic aortic aneurysm or dissection. Atherosclerosis of the aortic arch. Mediastinum/Nodes: Fluid-filled esophagus could reflect sequela of gastroesophageal reflux. Thyroid and trachea are unremarkable. No pathologic adenopathy. Lungs/Pleura: No airspace disease, effusion, or pneumothorax. Central airways are patent. Musculoskeletal: No acute or destructive bony abnormalities. Reconstructed images demonstrate no additional findings. CT ABDOMEN PELVIS FINDINGS Hepatobiliary: Cirrhosis. Indeterminate 1.2 cm hypodensity segment 4 B left lobe liver image 39/2, new since prior exam. Further evaluation with nonemergent outpatient liver MRI may be useful to exclude underlying neoplasm. Calcified gallstone without evidence of cholecystitis. No biliary duct dilation. Pancreas: Multilocular cystic lesion within the uncinate process of the pancreas measuring 4.0 x 3.9 cm, not appreciably changed since prior study. No evidence of acute pancreatitis. No pancreatic duct dilation. Spleen: Stable borderline splenomegaly, with spleen measuring up to 14.6 cm in AP dimension. No focal parenchymal abnormality. Adrenals/Urinary Tract: Stable appearance of the kidneys. No urinary tract calculi or obstructive uropathy. Adrenals and bladder are unremarkable. Stomach/Bowel: No bowel obstruction or ileus. Diverticulosis of the distal descending and sigmoid colon without evidence of diverticulitis. Mild diffuse bowel wall thickening likely due to hypoproteinemic state and adjacent ascites. Distended fluid-filled stomach, with possible sequela of gastroesophageal reflux with fluid-filled thoracic esophagus as above. Vascular/Lymphatic: The splenic vein, portal vein, and SMV are patent. Sequela of portal venous hypertension again noted, with marked splenic and gastroesophageal varices again identified. There is recanalization of the umbilical vein. No  pathologic adenopathy. Reproductive: Status post hysterectomy. No adnexal masses. Other: Trace ascites within the lower abdomen and pelvis. No free intraperitoneal gas. Stable small umbilical hernia containing mesenteric fat and ascites. No bowel herniation. Musculoskeletal: No acute or destructive bony abnormalities. Reconstructed images demonstrate no additional findings. IMPRESSION: 1. Cirrhosis, with portal venous hypertension manifested by splenomegaly, ascites, and multiple varices as above. 2. New 1.2 cm hypodensity within segment IVb left lobe liver, indeterminate. Definitive characterization with nonemergent outpatient liver MRI may be useful to exclude neoplasm in this patient with chronic cirrhosis. 3. Stable multilocular cystic lesion within the uncinate process of the pancreas. Further evaluation with EUS and tissue sampling may be useful given size. 4. Distal colonic diverticulosis without diverticulitis. 5. Distended fluid-filled stomach, with fluid-filled thoracic esophagus likely due to gastroesophageal reflux. Electronically Signed   By: Sharlet Salina M.D.   On: 07/01/2023 22:25   CT HEAD WO CONTRAST ( )  Result Date: 07/01/2023 CLINICAL DATA:  Altered mental status EXAM: CT HEAD WITHOUT CONTRAST TECHNIQUE: Contiguous axial images were obtained from the base of the skull through the vertex without intravenous contrast. RADIATION DOSE REDUCTION: This exam was performed according to the departmental dose-optimization program which includes automated exposure control, adjustment of the mA and/or kV according to patient size and/or use of iterative reconstruction technique. COMPARISON:  None Available. FINDINGS: Brain: There is no mass, hemorrhage or extra-axial collection. The size and configuration of the ventricles and extra-axial CSF spaces are normal. The brain parenchyma is normal, without acute or chronic infarction. 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 Vascular: No abnormal  hyperdensity of the major intracranial arteries or dural venous sinuses. No intracranial atherosclerosis. Skull: The visualized skull base, calvarium and extracranial soft tissues are normal. Sinuses/Orbits: No fluid  levels or advanced mucosal thickening of the visualized paranasal sinuses. No mastoid or middle ear effusion. The orbits are normal. IMPRESSION: 1. No acute intracranial abnormality. 2. 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 Electronically Signed   By: Deatra Robinson M.D.   On: 07/01/2023 22:07   DG Forearm Left  Result Date: 07/01/2023 CLINICAL DATA:  Fall EXAM: LEFT FOREARM - 2 VIEW COMPARISON:  None Available. FINDINGS: There is no evidence of fracture or other focal bone lesions. Soft tissues are unremarkable. IMPRESSION: Negative. Electronically Signed   By: Jasmine Pang M.D.   On: 07/01/2023 21:22     Medical Consultants:   None.   Subjective:    Dana Dyer relates she is having regular bowel movements on regular diet.  Objective:    Vitals:   07/02/23 1752 07/02/23 2119 07/03/23 0030 07/03/23 0612  BP: 115/62 118/73 128/73 129/70  Pulse: 76 88 82 91  Resp: 18 18 18 18   Temp: 98.5 F (36.9 C) 97.8 F (36.6 C) 97.9 F (36.6 C) 98.4 F (36.9 C)  TempSrc: Oral Oral Oral Oral  SpO2: 100% 97% 97% 98%  Weight:      Height:       SpO2: 98 %   Intake/Output Summary (Last 24 hours) at 07/03/2023 0904 Last data filed at 07/03/2023 0600 Gross per 24 hour  Intake 1980 ml  Output 650 ml  Net 1330 ml   Filed Weights   07/01/23 2034  Weight: 75.8 kg    Exam: General exam: In no acute distress. Respiratory system: Good air movement and clear to auscultation. Cardiovascular system: S1 & S2 heard, RRR. No JVD. Gastrointestinal system: Abdomen is nondistended, soft and nontender.  Extremities: No pedal edema. Skin: No rashes, lesions or ulcers Psychiatry: Judgement and insight appear normal. Mood & affect appropriate.  Data Reviewed:     Labs: Basic Metabolic Panel: Recent Labs  Lab 07/01/23 2040 07/02/23 0047 07/02/23 0640 07/03/23 0729  NA 133*  --  130* 138  K 3.6  --  4.0 3.7  CL 93*  --  96* 106  CO2 22  --  20* 22  GLUCOSE 204*  --  163* 129*  BUN 48*  --  43* 32*  CREATININE 1.13* 0.99 1.02* 0.90  CALCIUM 9.4  --  8.8* 8.7*   GFR Estimated Creatinine Clearance: 54.3 mL/min (by C-G formula based on SCr of 0.9 mg/dL). Liver Function Tests: Recent Labs  Lab 07/01/23 2040 07/02/23 0640 07/03/23 0729  AST 52* 48* 43*  ALT 48* 46* 42  ALKPHOS 64 62 50  BILITOT 3.4* 3.3* 3.0*  PROT 6.9 6.6 5.3*  ALBUMIN 3.3* 3.1* 2.6*   No results for input(s): "LIPASE", "AMYLASE" in the last 168 hours. Recent Labs  Lab 07/01/23 2040 07/02/23 0742  AMMONIA 103* 51*   Coagulation profile Recent Labs  Lab 07/02/23 0640  INR 1.1   COVID-19 Labs  No results for input(s): "DDIMER", "FERRITIN", "LDH", "CRP" in the last 72 hours.  No results found for: "SARSCOV2NAA"  CBC: Recent Labs  Lab 07/01/23 2040 07/02/23 0047 07/02/23 0640  WBC 7.0 6.2 6.1  NEUTROABS 4.7  --  4.0  HGB 10.1* 9.5* 9.6*  HCT 30.8* 29.2* 30.1*  MCV 86.3 86.9 87.0  PLT 114* 100* 98*   Cardiac Enzymes: No results for input(s): "CKTOTAL", "CKMB", "CKMBINDEX", "TROPONINI" in the last 168 hours. BNP (last 3 results) No results for input(s): "PROBNP" in the last 8760 hours. CBG: Recent Labs  Lab  07/01/23 2032 07/02/23 1141 07/02/23 1611 07/02/23 2122 07/03/23 0756  GLUCAP 197* 146* 140* 162* 108*   D-Dimer: No results for input(s): "DDIMER" in the last 72 hours. Hgb A1c: No results for input(s): "HGBA1C" in the last 72 hours. Lipid Profile: No results for input(s): "CHOL", "HDL", "LDLCALC", "TRIG", "CHOLHDL", "LDLDIRECT" in the last 72 hours. Thyroid function studies: No results for input(s): "TSH", "T4TOTAL", "T3FREE", "THYROIDAB" in the last 72 hours.  Invalid input(s): "FREET3" Anemia work up: No results for  input(s): "VITAMINB12", "FOLATE", "FERRITIN", "TIBC", "IRON", "RETICCTPCT" in the last 72 hours. Sepsis Labs: Recent Labs  Lab 07/01/23 2040 07/02/23 0047 07/02/23 0640  WBC 7.0 6.2 6.1   Microbiology Recent Results (from the past 240 hour(s))  Culture, blood (Routine X 2) w Reflex to ID Panel     Status: None (Preliminary result)   Collection Time: 07/02/23 12:10 AM   Specimen: BLOOD RIGHT ARM  Result Value Ref Range Status   Specimen Description   Final    BLOOD RIGHT ARM BOTTLES DRAWN AEROBIC AND ANAEROBIC Performed at [facility], [address], [place], [place] [address]    Special Requests   Final    Blood Culture adequate volume Performed at [facility], [address], [place], [place] [address]    Culture   Final    NO GROWTH <12 HOURS Performed at [facility], [address], [place], [place] [address]    Report Status PENDING  Incomplete  Culture, blood (Routine X 2) w Reflex to ID Panel     Status: None (Preliminary result)   Collection Time: 07/02/23 12:30 AM   Specimen: BLOOD LEFT HAND  Result Value Ref Range Status   Specimen Description   Final    BLOOD LEFT HAND BOTTLES DRAWN AEROBIC AND ANAEROBIC Performed at [facility], [address] [place], [place] [address]    Special Requests   Final    Blood Culture adequate volume Performed at [facility], [address], [place], [place] [address]    Culture   Final    NO GROWTH <12 HOURS Performed at [facility], [address], [place], [place] [address]    Report Status PENDING  Incomplete     Medications:    escitalopram  5 mg Oral Daily   insulin aspart  0-24 Units Subcutaneous TID WC   lactulose  20 g Oral TID   pantoprazole  40 mg Oral Daily   rifaximin  550 mg Oral BID   Continuous Infusions:    LOS: 1 day   Marinda Elk  [facility]  07/03/2023, 9:04 AM

## 2023-07-03 NOTE — Plan of Care (Signed)
?  Problem: Clinical Measurements: ?Goal: Ability to maintain clinical measurements within normal limits will improve ?Outcome: Progressing ?Goal: Will remain free from infection ?Outcome: Progressing ?Goal: Diagnostic test results will improve ?Outcome: Progressing ?  ?

## 2023-07-03 NOTE — Telephone Encounter (Signed)
Inbound call from patient's husband stating patient is currently hospitalized for cirrhosis of liver. Patient has an appointment schedule for 8/5 for banding of hemorrhoids. Patient and patient's husband do not wish to continue with banding and would like to change appointment to an office visit for liver cirrhosis. States they were advised by ED to come in on 8/5 for an office visit. Patient's husband requesting a call back to discuss appointment change. Please advise, thank you.

## 2023-07-03 NOTE — Progress Notes (Signed)
Patient ID: Dana Dyer, female   DOB: [birth year], 72 y.o.   MRN: [id number]    Progress Note   Subjective   Day # 2 CC; Elita Boone cirrhosis, hepatic encephalopathy-recent falls at home, confusion  On admit venous ammonia 103> 51 yesterday INR 1.1  Sodium 138/potassium 3.7/BUN 32/creatinine 0.9/albumin 2.6 T. bili 3.0/AST 43/ALT 42/alk phos 50  CT chest abdomen pelvis on admission-fluid-filled esophagus and stomach ,question GERD, cirrhotic appearing liver indeterminate 1.2 cm hypodensity segment 4B left lobe liver new since prior exam-further evaluation with MRI suggested, calcified gallstone Multilocular cystic lesion in the uncinate process of the pancreas 4.0 x 3.9 cm not appreciably changed since prior CT, borderline splenomegaly with spleen 14.6 cm   Patient is feeling better today.  In the room are her husband and daughter.  They tell me that she was having problems with intermittent vomiting at home.  The husband read that a potential side effect of lactulose and Xifaxan is vomiting.  Thus, he decreased the dosage of both.  They also tell me that her PCP was worried about weight loss and placed her on a high-protein diet.    Objective   Vital signs in last 24 hours: Temp:  [97.8 F (36.6 C)-98.7 F (37.1 C)] 98.4 F (36.9 C) (07/29 0612) Pulse Rate:  [76-107] 91 (07/29 0612) Resp:  [18] 18 (07/29 0612) BP: (110-129)/(62-77) 129/70 (07/29 0612) SpO2:  [97 %-100 %] 98 % (07/29 0612) Last BM Date : 07/02/23 General:    white female in NAD Heart:  Regular rate and rhythm; no murmurs Lungs: Respirations even and unlabored, lungs CTA bilaterally Abdomen:  Soft, nontender and nondistended. Normal bowel sounds. Extremities:  Without edema. Neurologic:  Alert and oriented, with mild but definite confusion.  Grossly normal neurologically.  She does have a tremor but no obvious asterixis. Psych:  Cooperative. Normal mood and affect.  Intake/Output from previous day: 07/28 0701 -  07/29 0700 In: 1980 [P.O.:180; I.V.:1800] Out: 650 [Urine:650] Intake/Output this shift: No intake/output data recorded.  Lab Results: Recent Labs    07/01/23 2040 07/02/23 0047 07/02/23 0640  WBC 7.0 6.2 6.1  HGB 10.1* 9.5* 9.6*  HCT 30.8* 29.2* 30.1*  PLT 114* 100* 98*   BMET Recent Labs    07/01/23 2040 07/02/23 0047 07/02/23 0640 07/03/23 0729  NA 133*  --  130* 138  K 3.6  --  4.0 3.7  CL 93*  --  96* 106  CO2 22  --  20* 22  GLUCOSE 204*  --  163* 129*  BUN 48*  --  43* 32*  CREATININE 1.13* 0.99 1.02* 0.90  CALCIUM 9.4  --  8.8* 8.7*   LFT Recent Labs    07/03/23 0729  PROT 5.3*  ALBUMIN 2.6*  AST 43*  ALT 42  ALKPHOS 50  BILITOT 3.0*   PT/INR Recent Labs    07/02/23 0640  LABPROT 14.8  INR 1.1    Studies/Results: DG CHEST PORT 1 VIEW  Result Date: 07/02/2023 CLINICAL DATA:  10031 with cough. EXAM: PORTABLE CHEST 1 VIEW COMPARISON:  Chest CT yesterday. FINDINGS: The lungs are clear with mild elevation of the right hemidiaphragm. There is no substantial pleural effusion. The cardiomediastinal silhouette and vascular pattern are normal. No new osseous findings. IMPRESSION: No active disease. Electronically Signed   By: Almira Bar M.D.   On: 07/02/2023 07:49   CT CHEST ABDOMEN PELVIS W CONTRAST  Result Date: 07/01/2023 CLINICAL DATA:  Nausea and vomiting, lethargic for  1 month, altered level of consciousness for 1 week, history of cirrhosis EXAM: CT CHEST, ABDOMEN, AND PELVIS WITH CONTRAST TECHNIQUE: Multidetector CT imaging of the chest, abdomen and pelvis was performed following the standard protocol during bolus administration of intravenous contrast. RADIATION DOSE REDUCTION: This exam was performed according to the departmental dose-optimization program which includes automated exposure control, adjustment of the mA and/or kV according to patient size and/or use of iterative reconstruction technique. CONTRAST:  OMNIPAQUE IOHEXOL 300 MG/ML   SOLN COMPARISON:  07/29/2022 FINDINGS: CT CHEST FINDINGS Cardiovascular: The heart is unremarkable without pericardial effusion. Calcification of the aortic and mitral valves. No evidence of thoracic aortic aneurysm or dissection. Atherosclerosis of the aortic arch. Mediastinum/Nodes: Fluid-filled esophagus could reflect sequela of gastroesophageal reflux. Thyroid and trachea are unremarkable. No pathologic adenopathy. Lungs/Pleura: No airspace disease, effusion, or pneumothorax. Central airways are patent. Musculoskeletal: No acute or destructive bony abnormalities. Reconstructed images demonstrate no additional findings. CT ABDOMEN PELVIS FINDINGS Hepatobiliary: Cirrhosis. Indeterminate 1.2 cm hypodensity segment 4 B left lobe liver image 39/2, new since prior exam. Further evaluation with nonemergent outpatient liver MRI may be useful to exclude underlying neoplasm. Calcified gallstone without evidence of cholecystitis. No biliary duct dilation. Pancreas: Multilocular cystic lesion within the uncinate process of the pancreas measuring 4.0 x 3.9 cm, not appreciably changed since prior study. No evidence of acute pancreatitis. No pancreatic duct dilation. Spleen: Stable borderline splenomegaly, with spleen measuring up to 14.6 cm in AP dimension. No focal parenchymal abnormality. Adrenals/Urinary Tract: Stable appearance of the kidneys. No urinary tract calculi or obstructive uropathy. Adrenals and bladder are unremarkable. Stomach/Bowel: No bowel obstruction or ileus. Diverticulosis of the distal descending and sigmoid colon without evidence of diverticulitis. Mild diffuse bowel wall thickening likely due to hypoproteinemic state and adjacent ascites. Distended fluid-filled stomach, with possible sequela of gastroesophageal reflux with fluid-filled thoracic esophagus as above. Vascular/Lymphatic: The splenic vein, portal vein, and SMV are patent. Sequela of portal venous hypertension again noted, with marked  splenic and gastroesophageal varices again identified. There is recanalization of the umbilical vein. No pathologic adenopathy. Reproductive: Status post hysterectomy. No adnexal masses. Other: Trace ascites within the lower abdomen and pelvis. No free intraperitoneal gas. Stable small umbilical hernia containing mesenteric fat and ascites. No bowel herniation. Musculoskeletal: No acute or destructive bony abnormalities. Reconstructed images demonstrate no additional findings. IMPRESSION: 1. Cirrhosis, with portal venous hypertension manifested by splenomegaly, ascites, and multiple varices as above. 2. New 1.2 cm hypodensity within segment IVb left lobe liver, indeterminate. Definitive characterization with nonemergent outpatient liver MRI may be useful to exclude neoplasm in this patient with chronic cirrhosis. 3. Stable multilocular cystic lesion within the uncinate process of the pancreas. Further evaluation with EUS and tissue sampling may be useful given size. 4. Distal colonic diverticulosis without diverticulitis. 5. Distended fluid-filled stomach, with fluid-filled thoracic esophagus likely due to gastroesophageal reflux. Electronically Signed   By: Sharlet Salina M.D.   On: 07/01/2023 22:25   CT HEAD WO CONTRAST ( )  Result Date: 07/01/2023 CLINICAL DATA:  Altered mental status EXAM: CT HEAD WITHOUT CONTRAST TECHNIQUE: Contiguous axial images were obtained from the base of the skull through the vertex without intravenous contrast. RADIATION DOSE REDUCTION: This exam was performed according to the departmental dose-optimization program which includes automated exposure control, adjustment of the mA and/or kV according to patient size and/or use of iterative reconstruction technique. COMPARISON:  None Available. FINDINGS: Brain: There is no mass, hemorrhage or extra-axial collection. The size and configuration of  the ventricles and extra-axial CSF spaces are normal. The brain parenchyma is normal,  without acute or chronic infarction. 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 Vascular: No abnormal hyperdensity of the major intracranial arteries or dural venous sinuses. No intracranial atherosclerosis. Skull: The visualized skull base, calvarium and extracranial soft tissues are normal. Sinuses/Orbits: No fluid levels or advanced mucosal thickening of the visualized paranasal sinuses. No mastoid or middle ear effusion. The orbits are normal. IMPRESSION: 1. No acute intracranial abnormality. 2. 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 Electronically Signed   By: Deatra Robinson M.D.   On: 07/01/2023 22:07   DG Forearm Left  Result Date: 07/01/2023 CLINICAL DATA:  Fall EXAM: LEFT FOREARM - 2 VIEW COMPARISON:  None Available. FINDINGS: There is no evidence of fracture or other focal bone lesions. Soft tissues are unremarkable. IMPRESSION: Negative. Electronically Signed   By: Jasmine Pang M.D.   On: 07/01/2023 21:22       Assessment / Plan:    ASSESSMENT: 1.  MASH cirrhosis with portal hypertension. 2.  Hepatic encephalopathy.  Likely the result of dehydration and decreased dosages of lactulose and Xifaxan.  Improved 3.  Anemia.  Multifactorial.  Colonoscopy and upper endoscopy with Dr. Christella Hartigan April 2023.   4.  Multiple medical problems  Recommendations: 1.  Resume lactulose to achieve 3-5 bowel movements per day 2.  Resume Xifaxan 550 mg p.o. twice daily 3.  The patient has an office appointment with Dr. Barron Alvine on August 5.  They should keep this appointment as scheduled. 4.  If she is tolerating diet and continues to mentate at her baseline, okay for discharge tomorrow. GI will follow  Wilhemina Bonito. Eda Keys., M.D. [facility] Division of Gastroenterology

## 2023-07-04 ENCOUNTER — Ambulatory Visit: Payer: Medicare PPO | Admitting: Internal Medicine

## 2023-07-04 ENCOUNTER — Other Ambulatory Visit: Payer: Medicare PPO

## 2023-07-04 DIAGNOSIS — K7689 Other specified diseases of liver: Secondary | ICD-10-CM | POA: Diagnosis not present

## 2023-07-04 DIAGNOSIS — D649 Anemia, unspecified: Secondary | ICD-10-CM | POA: Diagnosis not present

## 2023-07-04 DIAGNOSIS — K7682 Hepatic encephalopathy: Secondary | ICD-10-CM | POA: Diagnosis not present

## 2023-07-04 DIAGNOSIS — K7581 Nonalcoholic steatohepatitis (NASH): Secondary | ICD-10-CM | POA: Diagnosis not present

## 2023-07-04 LAB — AMMONIA: Ammonia: 64 umol/L — ABNORMAL HIGH (ref 9–35)

## 2023-07-04 LAB — GLUCOSE, CAPILLARY
Glucose-Capillary: 117 mg/dL — ABNORMAL HIGH (ref 70–99)
Glucose-Capillary: 197 mg/dL — ABNORMAL HIGH (ref 70–99)
Glucose-Capillary: 157 mg/dL — ABNORMAL HIGH (ref 70–99)
Glucose-Capillary: 128 mg/dL — ABNORMAL HIGH (ref 70–99)

## 2023-07-04 MED ORDER — LACTULOSE 10 GM/15ML PO SOLN
30.0000 g | Freq: Three times a day (TID) | ORAL | Status: DC
  Administered 2023-07-04 – 2023-07-05 (×4): 30 g via ORAL
  Filled 2023-07-04 (×4): qty 45

## 2023-07-04 NOTE — TOC Initial Note (Signed)
Transition of Care [facility]) - Initial/Assessment Note    Patient Details  Name: Dana Dyer MRN: [id number] Date of Birth: 07-21-1951  Transition of Care [facility]) CM/SW Contact:    Amada Jupiter, LCSW Phone Number: 07/04/2023, 2:26 PM  Clinical Narrative:                 Met with pt and spouse today to introduce TOC role with dc planning needs.  Spouse confirms that he is primary support for pt at home.  He is eager to have pt work with PT here (order placed) and requests to have HH when she is discharged.   Spouse answering all questions with pt awake but quiet in bed.  Will await PT eval to determine if any DME needs and will set up [facility] services.    Expected Discharge Plan: Home w Home Health Services Barriers to Discharge: Continued Medical Work up   Patient Goals and CMS Choice Patient states their goals for this hospitalization and ongoing recovery are:: return home          Expected Discharge Plan and Services In-house Referral: Clinical Social Work     Living arrangements for the past 2 months: Single Family Home                                      Prior Living Arrangements/Services Living arrangements for the past 2 months: Single Family Home Lives with:: Spouse Patient language and need for interpreter reviewed:: Yes Do you feel safe going back to the place where you live?: Yes      Need for Family Participation in Patient Care: Yes (Comment) Care giver support system in place?: Yes (comment)   Criminal Activity/Legal Involvement Pertinent to Current Situation/Hospitalization: No - Comment as needed  Activities of Daily Living Home Assistive Devices/Equipment: Eyeglasses, Shower chair with back, Raised toilet seat with rails, Walker (specify type), External Monitoring Devices ADL Screening (condition at time of admission) Patient's cognitive ability adequate to safely complete daily activities?: No Is the patient deaf or have difficulty hearing?: No Does  the patient have difficulty seeing, even when wearing glasses/contacts?: No Does the patient have difficulty concentrating, remembering, or making decisions?: Yes Patient able to express need for assistance with ADLs?: Yes Does the patient have difficulty dressing or bathing?: Yes Independently performs ADLs?: No Communication: Independent Dressing (OT): Needs assistance Is this a change from baseline?: Pre-admission baseline Grooming: Needs assistance Is this a change from baseline?: Pre-admission baseline Feeding: Independent Bathing: Needs assistance Is this a change from baseline?: Pre-admission baseline Toileting: Needs assistance Is this a change from baseline?: Pre-admission baseline In/Out Bed: Needs assistance Is this a change from baseline?: Pre-admission baseline Walks in Home: Needs assistance Is this a change from baseline?: Pre-admission baseline Does the patient have difficulty walking or climbing stairs?: Yes Weakness of Legs: Both Weakness of Arms/Hands: None  Permission Sought/Granted Permission sought to share information with : Family Supports Permission granted to share information with : Yes, Verbal Permission Granted  Share Information with NAME: spouse, Ivona Cooksey @ [phone]           Emotional Assessment Appearance:: Appears stated age Attitude/Demeanor/Rapport: Gracious Affect (typically observed): Accepting, Pleasant, Quiet Orientation: : Oriented to Self, Oriented to Place Alcohol / Substance Use: Not Applicable Psych Involvement: No (comment)  Admission diagnosis:  Hepatic encephalopathy (HCC) [K76.82] Patient Active Problem List   Diagnosis Date Noted  AKI (acute kidney injury) (HCC) 07/02/2023   Hepatic encephalopathy (HCC) 07/01/2023   Encephalopathy, hepatic (HCC) 10/20/2022   DOE (dyspnea on exertion) 08/19/2022   NASH (nonalcoholic steatohepatitis) 08/19/2022   Dehydration with hyponatremia 07/29/2022   Iron deficiency anemia  07/06/2022   Iron deficiency anemia due to chronic blood loss 06/28/2022   Hyperkalemia 03/17/2022   Hyponatremia 03/17/2022   Degenerative disc disease, cervical 10/05/2021   Type 2 diabetes mellitus with diabetic polyneuropathy, with long-term current use of insulin (HCC) 04/29/2020   Dyslipidemia 09/09/2019   Type 2 diabetes mellitus with hyperglycemia, with long-term current use of insulin (HCC) 09/09/2019   Thrombocytopenia (HCC) 08/23/2018   Splenomegaly 08/23/2018   Cirrhosis (HCC) 08/23/2018   Depression, major, single episode, severe (HCC) 07/31/2017   Low serum vitamin B12 07/31/2017   Degenerative disc disease, lumbar 09/19/2016   Arthritis, midfoot 04/12/2016   Tarsal tunnel syndrome of right side 03/17/2016   Tibialis posterior tendon tear, nontraumatic 02/25/2016   Biceps tendinitis on left 07/14/2015   Tremor of left hand 07/14/2015   Mild carpal tunnel syndrome of left wrist 06/23/2014   Greater trochanteric bursitis of right hip 06/23/2014   Obesity (BMI 30-39.9) 09/05/2013   Asthmatic bronchitis 08/12/2013   TRANSAMINASES, SERUM, ELEVATED 07/08/2009   Controlled type 2 diabetes mellitus without complication, without long-term current use of insulin (HCC) 03/16/2009   Hypersomnolence 10/21/2008   Hyperlipemia 10/09/2007   Obstructive sleep apnea 10/09/2007   Essential hypertension 10/09/2007   ALLERGIC RHINITIS 10/09/2007   FIBROMYALGIA 10/09/2007   PCP:  Karie Georges, MD Pharmacy:   [phone] - [place], [place] - [address] AT [address] [facility] [address] [place] [address] Phone: [phone] Fax: [phone]     Social Determinants of Health (SDOH) Social History: SDOH Screenings   Food Insecurity: No Food Insecurity (07/02/2023)  Housing: Low Risk  (07/02/2023)  Transportation Needs: No Transportation Needs (07/02/2023)  Utilities: Not At Risk (07/02/2023)  Alcohol Screen: Low Risk  (08/10/2022)   Depression (PHQ2-9): Low Risk  (06/27/2023)  Financial Resource Strain: Low Risk  (08/10/2022)  Physical Activity: Inactive (08/10/2022)  Social Connections: Socially Integrated (08/10/2022)  Stress: No Stress Concern Present (08/10/2022)  Tobacco Use: Low Risk  (07/01/2023)   SDOH Interventions:     Readmission Risk Interventions    08/01/2022   11:15 AM  Readmission Risk Prevention Plan  Post Dischage Appt Complete  Medication Screening Complete  Transportation Screening Complete

## 2023-07-04 NOTE — Progress Notes (Addendum)
Patient ID: Dana Dyer, female   DOB: [birth year], 72 y.o.   MRN: [id number]    Progress Note   Subjective   Day # 3  CC;  NASH Cirrhosis, AMS  No new labs this am  Patient was sleeping, easily awakened, no family in the room She has no specific complaints.  She is disoriented to date, year and president, oriented to person and place    Objective   Vital signs in last 24 hours: Temp:  [97.5 F (36.4 C)-98.3 F (36.8 C)] 97.5 F (36.4 C) (07/30 0620) Pulse Rate:  [76-86] 76 (07/30 0620) Resp:  [15-16] 15 (07/30 0620) BP: (107-126)/(62-71) 107/67 (07/30 0620) SpO2:  [98 %-99 %] 98 % (07/30 0620) Last BM Date : 07/03/23 General:   Elderly white female in NAD, sleepy Heart:  Regular rate and rhythm; no murmurs Lungs: Respirations even and unlabored, lungs CTA bilaterally Abdomen:  Soft, nontender and nondistended. Normal bowel sounds. Extremities:  Without edema. Neurologic:  Alert and oriented x 2, no asterixis Psych:  Cooperative. Normal mood and affect.  Intake/Output from previous day: 07/29 0701 - 07/30 0700 In: 1050 [P.O.:1050] Out: 700 [Urine:700] Intake/Output this shift: No intake/output data recorded.  Lab Results: Recent Labs    07/01/23 2040 07/02/23 0047 07/02/23 0640  WBC 7.0 6.2 6.1  HGB 10.1* 9.5* 9.6*  HCT 30.8* 29.2* 30.1*  PLT 114* 100* 98*   BMET Recent Labs    07/01/23 2040 07/02/23 0047 07/02/23 0640 07/03/23 0729  NA 133*  --  130* 138  K 3.6  --  4.0 3.7  CL 93*  --  96* 106  CO2 22  --  20* 22  GLUCOSE 204*  --  163* 129*  BUN 48*  --  43* 32*  CREATININE 1.13* 0.99 1.02* 0.90  CALCIUM 9.4  --  8.8* 8.7*   LFT Recent Labs    07/03/23 0729  PROT 5.3*  ALBUMIN 2.6*  AST 43*  ALT 42  ALKPHOS 50  BILITOT 3.0*   PT/INR Recent Labs    07/02/23 0640  LABPROT 14.8  INR 1.1    Studies/Results: No results found.     Assessment / Plan:    #36 72 year old white female with history of Elita Boone cirrhosis admitted with  confusion and recent falls at home secondary to hepatic encephalopathy.  Per notes her husband had decreased the doses of lactulose and Xifaxan because she was nauseated at home.  She is still encephalopathic Continue Xifaxan 550 twice daily Will increase lactulose over the next 24 to 48 hours. I do not think she is ready for discharge today  #2 anemia multifactorial-small varices and portal gastropathy on EGD 2023 #3 adult onset diabetes mellitus #4 thrombocytopenia secondary to cirrhosis  Plan; carb modified 2 g sodium diet Increase lactulose to 30 g 3 times daily from 20 g 3 times daily need to push doses over the next 24 to 36 hours, if she will not take p.o. will need enemas Continue Xifaxan 550 twice daily Check ammonia today       Principal Problem:   Hepatic encephalopathy (HCC) Active Problems:   Controlled type 2 diabetes mellitus without complication, without long-term current use of insulin (HCC)   Thrombocytopenia (HCC)   Cirrhosis (HCC)   Type 2 diabetes mellitus with hyperglycemia, with long-term current use of insulin (HCC)   Hyponatremia   Encephalopathy, hepatic (HCC)   AKI (acute kidney injury) (HCC)    LOS: 2 days   Amy  Esterwood PA-C 07/04/2023, 8:39 AM  GI ATTENDING  Interval history data reviewed.  Patient seen and examined.  Agree with interval progress note as outlined above.  Agree with impression and plans, without additions or deletions, as outlined above.  Will follow.  Dana Dyer. Dana Dyer., M.D. [facility] Division of Gastroenterology

## 2023-07-04 NOTE — Progress Notes (Signed)
[facility] PROGRESS NOTE    Progress Note  Dana Dyer  RUE:[id number] DOB: Feb 09, 1951 DOA: 07/01/2023 PCP: Karie Georges, MD     Brief Narrative:   Dana Dyer is an 72 y.o. female past medical history significant for decompensated Elita Boone cirrhosis, GERD essential hypertension comes in with altered mental status and a fall, husband provides most of the history he relates that for the last several weeks she has been having episodes of confusion, CT scan of the abdomen pelvis shows cirrhosis with portal hypertension splenomegaly ascites and multiple varices and new 1.2 cm hypodensity in the left liver lobe.CT of the head showed no acute findings   Assessment/Plan:   Decompensated cirrhosis/hepatic encephalopathy: No clear precipitating factors.  Her mentation is improved she is alert and oriented x 3 Blood cultures were obtained and have remained negative till date. GI on board increase her lactulose continue rifaximin. Today her mentation has not quite as good as it was yesterday her husband confirms. Will need to increase lactulose.  Further management per GI.  Hypovolemic hyponatremia: Resolved Allow a diet. Continue Aldactone.  Acute kidney injury: With a baseline creatinine around 0.8 on admission 1.2. Likely prerenal azotemia from decreased oral intake. Resolved with IV fluids.  Tachycardia: Likely due to holding metoprolol resume oral metoprolol.  Normocytic anemia: Husband relates no signs of bleeding hemoglobin is around baseline. Will recheck tomorrow after hydration.  GERD: Continue PPI.  Depression:  Lexapro.  Controlled type 2 diabetes mellitus without complication, without long-term current use of insulin (HCC): Allow a diet started back on her home dose of long-acting insulin, can restart sliding scale insulin.  Chronic thrombocytopenia (HCC) Platelets are relatively stable compared to previous labs, continue to monitor.   DVT  prophylaxis: lovenox Family Communication: Husband Status is: Observation The patient will require care spanning > 2 midnights and should be moved to inpatient because: Acute metabolic encephalopathy of unclear source    Code Status:     Code Status Orders  (From admission, onward)           Start     Ordered   07/01/23 2347  Full code  Continuous       Question:  By:  Answer:  Consent: discussion documented in EHR   07/01/23 2347           Code Status History     Date Active Date Inactive Code Status Order ID Comments User Context   07/30/2022 0415 08/01/2022 1820 Full Code [id number]  Darlin Drop, DO Inpatient         IV Access:   Peripheral IV   Procedures and diagnostic studies:   No results found.   Medical Consultants:   None.   Subjective:    Dana Dyer having regular bowel movements tolerating her diet  Objective:    Vitals:   07/03/23 0612 07/03/23 1235 07/03/23 2220 07/04/23 0620  BP: 129/70 126/62 119/71 107/67  Pulse: 91 81 86 76  Resp: 18 16 16 15   Temp: 98.4 F (36.9 C) 98.3 F (36.8 C) 98 F (36.7 C) (!) 97.5 F (36.4 C)  TempSrc: Oral Oral Oral Oral  SpO2: 98% 99% 98% 98%  Weight:      Height:       SpO2: 98 %   Intake/Output Summary (Last 24 hours) at 07/04/2023 1026 Last data filed at 07/04/2023 0600 Gross per 24 hour  Intake 690 ml  Output 700 ml  Net -10 ml  Filed Weights   07/01/23 2034  Weight: 75.8 kg    Exam: General exam: In no acute distress. Respiratory system: Good air movement and clear to auscultation. Cardiovascular system: S1 & S2 heard, RRR. No JVD. Gastrointestinal system: Abdomen is nondistended, soft and nontender.  Extremities: No pedal edema. Skin: No rashes, lesions or ulcers Psychiatry: Judgment and appear poor  Data Reviewed:    Labs: Basic Metabolic Panel: Recent Labs  Lab 07/01/23 2040 07/02/23 0047 07/02/23 0640 07/03/23 0729  NA 133*  --  130* 138  K 3.6  --   4.0 3.7  CL 93*  --  96* 106  CO2 22  --  20* 22  GLUCOSE 204*  --  163* 129*  BUN 48*  --  43* 32*  CREATININE 1.13* 0.99 1.02* 0.90  CALCIUM 9.4  --  8.8* 8.7*   GFR Estimated Creatinine Clearance: 54.3 mL/min (by C-G formula based on SCr of 0.9 mg/dL). Liver Function Tests: Recent Labs  Lab 07/01/23 2040 07/02/23 0640 07/03/23 0729  AST 52* 48* 43*  ALT 48* 46* 42  ALKPHOS 64 62 50  BILITOT 3.4* 3.3* 3.0*  PROT 6.9 6.6 5.3*  ALBUMIN 3.3* 3.1* 2.6*   No results for input(s): "LIPASE", "AMYLASE" in the last 168 hours. Recent Labs  Lab 07/01/23 2040 07/02/23 0742  AMMONIA 103* 51*   Coagulation profile Recent Labs  Lab 07/02/23 0640  INR 1.1   COVID-19 Labs  No results for input(s): "DDIMER", "FERRITIN", "LDH", "CRP" in the last 72 hours.  No results found for: "SARSCOV2NAA"  CBC: Recent Labs  Lab 07/01/23 2040 07/02/23 0047 07/02/23 0640  WBC 7.0 6.2 6.1  NEUTROABS 4.7  --  4.0  HGB 10.1* 9.5* 9.6*  HCT 30.8* 29.2* 30.1*  MCV 86.3 86.9 87.0  PLT 114* 100* 98*   Cardiac Enzymes: No results for input(s): "CKTOTAL", "CKMB", "CKMBINDEX", "TROPONINI" in the last 168 hours. BNP (last 3 results) No results for input(s): "PROBNP" in the last 8760 hours. CBG: Recent Labs  Lab 07/03/23 0756 07/03/23 1150 07/03/23 1651 07/03/23 2053 07/04/23 0734  GLUCAP 108* 168* 179* 151* 117*   D-Dimer: No results for input(s): "DDIMER" in the last 72 hours. Hgb A1c: No results for input(s): "HGBA1C" in the last 72 hours. Lipid Profile: No results for input(s): "CHOL", "HDL", "LDLCALC", "TRIG", "CHOLHDL", "LDLDIRECT" in the last 72 hours. Thyroid function studies: No results for input(s): "TSH", "T4TOTAL", "T3FREE", "THYROIDAB" in the last 72 hours.  Invalid input(s): "FREET3" Anemia work up: No results for input(s): "VITAMINB12", "FOLATE", "FERRITIN", "TIBC", "IRON", "RETICCTPCT" in the last 72 hours. Sepsis Labs: Recent Labs  Lab 07/01/23 2040  07/02/23 0047 07/02/23 0640  WBC 7.0 6.2 6.1   Microbiology Recent Results (from the past 240 hour(s))  Culture, blood (Routine X 2) w Reflex to ID Panel     Status: None (Preliminary result)   Collection Time: 07/02/23 12:10 AM   Specimen: BLOOD RIGHT ARM  Result Value Ref Range Status   Specimen Description   Final    BLOOD RIGHT ARM BOTTLES DRAWN AEROBIC AND ANAEROBIC Performed at [facility], [address], [place], [place] [address]    Special Requests   Final    Blood Culture adequate volume Performed at [facility], [address], [place], [place] [address]    Culture   Final    NO GROWTH 2 DAYS Performed at [facility], [address], [place], [place] [address]  Report Status PENDING  Incomplete  Culture, blood (Routine X 2) w Reflex to ID Panel     Status: None (Preliminary result)   Collection Time: 07/02/23 12:30 AM   Specimen: BLOOD LEFT HAND  Result Value Ref Range Status   Specimen Description   Final    BLOOD LEFT HAND BOTTLES DRAWN AEROBIC AND ANAEROBIC Performed at [facility], [address], [place], [place] [address]    Special Requests   Final    Blood Culture adequate volume Performed at [facility], [address], [place], [place] [address]    Culture   Final    NO GROWTH 2 DAYS Performed at [facility], [address], [place], [place] [address]    Report Status PENDING  Incomplete     Medications:    escitalopram  5 mg Oral Daily   insulin aspart  0-5 Units Subcutaneous QHS   insulin aspart  0-9 Units Subcutaneous TID WC   insulin aspart  4 Units Subcutaneous TID WC   insulin glargine-yfgn  15 Units Subcutaneous BID   lactulose  30 g Oral TID   metoprolol tartrate  12.5 mg Oral BID   pantoprazole  40 mg Oral Daily   rifaximin  550 mg Oral BID   spironolactone  50 mg Oral Daily   Continuous Infusions:    LOS: 2 days   Marinda Elk  [facility]  07/04/2023, 10:26 AM

## 2023-07-04 NOTE — Plan of Care (Signed)
  Problem: Clinical Measurements: Goal: Respiratory complications will improve Outcome: Adequate for Discharge   

## 2023-07-04 NOTE — Telephone Encounter (Signed)
Spoke with patient's husband this morning. Changed appointment from [facility] to hospital f/u which was scheduled on 07/10/23.

## 2023-07-05 DIAGNOSIS — K7581 Nonalcoholic steatohepatitis (NASH): Secondary | ICD-10-CM | POA: Diagnosis not present

## 2023-07-05 DIAGNOSIS — K7460 Unspecified cirrhosis of liver: Secondary | ICD-10-CM

## 2023-07-05 DIAGNOSIS — R4182 Altered mental status, unspecified: Secondary | ICD-10-CM | POA: Diagnosis not present

## 2023-07-05 DIAGNOSIS — K7682 Hepatic encephalopathy: Secondary | ICD-10-CM | POA: Diagnosis not present

## 2023-07-05 LAB — COMPREHENSIVE METABOLIC PANEL
ALT: 43 U/L (ref 0–44)
AST: 49 U/L — ABNORMAL HIGH (ref 15–41)
Albumin: 2.9 g/dL — ABNORMAL LOW (ref 3.5–5.0)
Alkaline Phosphatase: 65 U/L (ref 38–126)
Anion gap: 12 (ref 5–15)
BUN: 17 mg/dL (ref 8–23)
CO2: 21 mmol/L — ABNORMAL LOW (ref 22–32)
Calcium: 8.7 mg/dL — ABNORMAL LOW (ref 8.9–10.3)
Chloride: 100 mmol/L (ref 98–111)
Creatinine, Ser: 0.84 mg/dL (ref 0.44–1.00)
GFR, Estimated: >60 mL/min (ref >60)
Glucose, Bld: 143 mg/dL — ABNORMAL HIGH (ref 70–99)
Potassium: 3.8 mmol/L (ref 3.5–5.1)
Sodium: 133 mmol/L — ABNORMAL LOW (ref 135–145)
Total Bilirubin: 3.1 mg/dL — ABNORMAL HIGH (ref 0.3–1.2)
Total Protein: 6.3 g/dL — ABNORMAL LOW (ref 6.5–8.1)

## 2023-07-05 LAB — AMMONIA: Ammonia: 45 umol/L — ABNORMAL HIGH (ref 9–35)

## 2023-07-05 LAB — GLUCOSE, CAPILLARY: Glucose-Capillary: 136 mg/dL — ABNORMAL HIGH (ref 70–99)

## 2023-07-05 MED ORDER — LACTULOSE 10 GM/15ML PO SOLN: ORAL | 1 refills | Status: DC

## 2023-07-05 NOTE — TOC Transition Note (Signed)
Transition of Care [facility]) - CM/SW Discharge Note   Patient Details  Name: Dana Dyer MRN: [id number] Date of Birth: 04-Jan-1951  [facility] CM/SW Contact:  Amada Jupiter, LCSW Phone Number: 07/05/2023, 1:11 PM   Clinical Narrative:    Pt cleared for dc home today.  Orders placed for HHPT/OT and pt without agency preference.  Referral placed and accepted with [facility].  No further TOC needs.   Final next level of care: Home w Home Health Services Barriers to Discharge: Barriers Resolved   Patient Goals and CMS Choice      Discharge Placement                         Discharge Plan and Services Additional resources added to the After Visit Summary for   In-house Referral: Clinical Social Work              DME Arranged: N/A DME Agency: NA       HH Arranged: PT, OT HH Agency: [facility] Date [facility] Agency Contacted: 07/05/23 Time HH Agency Contacted: [phone] Representative spoke with at [facility] Agency: Kandee Keen  Social Determinants of Health (SDOH) Interventions SDOH Screenings   Food Insecurity: No Food Insecurity (07/02/2023)  Housing: Low Risk  (07/02/2023)  Transportation Needs: No Transportation Needs (07/02/2023)  Utilities: Not At Risk (07/02/2023)  Alcohol Screen: Low Risk  (08/10/2022)  Depression (PHQ2-9): Low Risk  (06/27/2023)  Financial Resource Strain: Low Risk  (08/10/2022)  Physical Activity: Inactive (08/10/2022)  Social Connections: Socially Integrated (08/10/2022)  Stress: No Stress Concern Present (08/10/2022)  Tobacco Use: Low Risk  (07/01/2023)     Readmission Risk Interventions    07/05/2023    1:08 PM 08/01/2022   11:15 AM  Readmission Risk Prevention Plan  Post Dischage Appt Complete Complete  Medication Screening Complete Complete  Transportation Screening Complete Complete

## 2023-07-05 NOTE — Progress Notes (Addendum)
Patient ID: Dana Dyer, female   DOB: [birth year], 72 y.o.   MRN: [id number]    Progress Note   Subjective   Day # 4 CC;NASH cirrhosis, altered mental status/hepatic encephalopathy  Labs today-ammonia 45-down from 103 on admit Sodium 133/potassium 3.8 BUN 17/creatinine 0.84 T. bili 3.1/AST 49  Patient is sitting up in the chair, husband helping her eat breakfast says she is much more perky and more like herself today, eating without difficulty, she says she is feeling better has no specific complaints.  Both are hoping she can be discharged today  She has had multiple bowel movements with increase in lactulose yesterday    Objective   Vital signs in last 24 hours: Temp:  [97.4 F (36.3 C)-98 F (36.7 C)] 98 F (36.7 C) (07/31 0557) Pulse Rate:  [68-77] 77 (07/31 0557) Resp:  [18] 18 (07/31 0557) BP: (104-111)/(61-68) 106/62 (07/31 0557) SpO2:  [95 %-99 %] 99 % (07/31 0557) Last BM Date : 07/03/23 General:   Older white female in NAD Heart:  Regular rate and rhythm; no murmurs Lungs: Respirations even and unlabored, lungs CTA bilaterally Abdomen:  Soft, nontender and nondistended. Normal bowel sounds. Extremities:  Without edema. Neurologic:  Alert and oriented,  grossly normal neurologically, no asterixis Psych:  Cooperative. Normal mood and affect.  Intake/Output from previous day: 07/30 0701 - 07/31 0700 In: 960 [P.O.:960] Out: 1100 [Urine:1100] Intake/Output this shift: Total I/O In: -  Out: 50 [Urine:50]  Lab Results: No results for input(s): "WBC", "HGB", "HCT", "PLT" in the last 72 hours. BMET Recent Labs    07/03/23 0729 07/05/23 0826  NA 138 133*  K 3.7 3.8  CL 106 100  CO2 22 21*  GLUCOSE 129* 143*  BUN 32* 17  CREATININE 0.90 0.84  CALCIUM 8.7* 8.7*   LFT Recent Labs    07/05/23 0826  PROT 6.3*  ALBUMIN 2.9*  AST 49*  ALT 43  ALKPHOS 65  BILITOT 3.1*   PT/INR No results for input(s): "LABPROT", "INR" in the last 72  hours.      Assessment / Plan:    #17 72 year old white female with history of Elita Bo[place] cirrhosis admitted with confusion and recent falls at home felt secondary to hepatic encephalopathy.  Patient had developed vomiting about 2 days prior to admission, no hematemesis no fever or chills no diarrhea no complaints of pain but did not eat well during that time and husband decreased her meds because he was concerned that the lactulose is what had made her vomit.   has not had any vomiting here, eating solid food without difficulty Still a bit encephalopathic yesterday, husband says she has improved significantly since admission and is about back to her baseline  #2 new 1.2 cm hypodensity left lobe of the liver indeterminant-MRI suggested-will check AFP this morning  MRI can be done as an outpatient  #3 anemia-multifactorial previously documented portal gastropathy #4 adult onset diabetes mellitus #5 thrombocytopenia secondary to cirrhosis #6 stable multilocular cystic lesion in the uncinate process of the pancreas-consider EUS   Plan; okay from GI perspective for patient to be discharged today Continue Xifaxan 550 twice daily Discussed with husband to increase home lactulose dosage to achieve at least 3 loose bowel movements per day Will start with 3 tablespoons p.o. every morning, 2 tablespoons at lunch, and 2 tablespoons at dinner. Carb modified 2 g sodium diet She has an appointment next week on 07/10/2023 with Dr. Barron Alvine in our office. Will need to set  up for MRI, of the liver, and may also want to do MRI/MRCP to include the multi locular cystic lesion/chronic in the pancreas.   Principal Problem:   Hepatic encephalopathy (HCC) Active Problems:   Controlled type 2 diabetes mellitus without complication, without long-term current use of insulin (HCC)   Thrombocytopenia (HCC)   Cirrhosis (HCC)   Type 2 diabetes mellitus with hyperglycemia, with long-term current use of insulin (HCC)    Hyponatremia   Encephalopathy, hepatic (HCC)   AKI (acute kidney injury) (HCC)     LOS: 3 days   Amy Esterwood PA-C 07/05/2023, 9:45 AM  GI ATTENDING  Interval history data reviewed.  Patient seen and examined.  Agree with interval progress note as outlined above.  Agree with GI perspective discharge plans as outlined above.  She has close GI follow-up, next week (Monday), with Dr. Barron Alvine.  Will sign off.  Wilhemina Bonito. Eda Keys., M.D. [facility] Division of Gastroenterology

## 2023-07-05 NOTE — Progress Notes (Signed)
Reviewed d/c instructions with pt and husband. All questions answered. Pt taken to front entrance to meet ride in stable condition.

## 2023-07-05 NOTE — Evaluation (Signed)
Physical Therapy Evaluation Patient Details Name: Dana Dyer MRN: [id number] DOB: 01/06/1951 Today's Date: 07/05/2023  History of Present Illness  72 y.o. female past medical history significant for decompensated Nash cirrhosis, GERD essential hypertension comes in with altered mental status and a fall, husband provides most of the history he relates that for the last several weeks she has been having episodes of confusion, CT scan of the abdomen pelvis shows cirrhosis with portal hypertension splenomegaly ascites and multiple varices and new 1.2 cm hypodensity in the left liver lobe. Dx of decompensated cirrhosis, hepatic encephalopathy, hyponatremia, AKI.  Clinical Impression  Pt is mobilizing well, she ambulated 400' with RW, no loss of balance. Pt noted to have some wheezing after walking, SpO2 97% on room air, RN notified. Pt has a RW at home and was encouraged to use it for several days until she returns to baseline with strength and endurance. No further acute PT indicated as she is mobilizing well, will sign off.         If plan is discharge home, recommend the following: A little help with bathing/dressing/bathroom;Assistance with cooking/housework;Assist for transportation   Can travel by private vehicle        Equipment Recommendations None recommended by PT  Recommendations for Other Services       Functional Status Assessment Patient has had a recent decline in their functional status and demonstrates the ability to make significant improvements in function in a reasonable and predictable amount of time.     Precautions / Restrictions Precautions Precautions: Fall Precaution Comments: fell after bout of vomiting just prior to admission, no other falls in past 6 months Restrictions Weight Bearing Restrictions: No      Mobility  Bed Mobility Overal bed mobility: Needs Assistance Bed Mobility: Sit to Supine, Supine to Sit     Supine to sit: Modified independent  (Device/Increase time), HOB elevated Sit to supine: Min assist   General bed mobility comments: min A for BLEs into bed    Transfers Overall transfer level: Needs assistance Equipment used: None Transfers: Sit to/from Stand Sit to Stand: Supervision           General transfer comment: supervision for safety    Ambulation/Gait Ambulation/Gait assistance: Supervision Gait Distance (Feet): 400 Feet Assistive device: Rolling walker (2 wheels) Gait Pattern/deviations: WFL(Within Functional Limits) Gait velocity: WFL     General Gait Details: attempted walking without AD initially and pt was reaching frequently for handrail so then walked with RW, pt was steady, no loss of balance with RW, wheezing noted at end of walk, SpO2 97% on room air after walking, RN notified of wheezing  Stairs            Wheelchair Mobility     Tilt Bed    Modified Rankin (Stroke Patients Only)       Balance Overall balance assessment: Modified Independent                                           Pertinent Vitals/Pain Pain Assessment Pain Assessment: No/denies pain    Home Living Family/patient expects to be discharged to:: Private residence Living Arrangements: Spouse/significant other Available Help at Discharge: Family;Available 24 hours/day Type of Home: House Home Access: Stairs to enter Entrance Stairs-Rails: Right Entrance Stairs-Number of Steps: 4   Home Layout: One level Home Equipment: Agricultural consultant (2 wheels)  Prior Function Prior Level of Function : Independent/Modified Independent             Mobility Comments: walks without AD ADLs Comments: independent     Hand Dominance        Extremity/Trunk Assessment   Upper Extremity Assessment Upper Extremity Assessment: Overall WFL for tasks assessed    Lower Extremity Assessment Lower Extremity Assessment: Overall WFL for tasks assessed    Cervical / Trunk  Assessment Cervical / Trunk Assessment: Normal  Communication   Communication: No difficulties  Cognition Arousal/Alertness: Awake/alert Behavior During Therapy: WFL for tasks assessed/performed Overall Cognitive Status: Within Functional Limits for tasks assessed                                          General Comments      Exercises     Assessment/Plan    PT Assessment Patient does not need any further PT services  PT Problem List         PT Treatment Interventions      PT Goals (Current goals can be found in the Care Plan section)  Acute Rehab PT Goals PT Goal Formulation: All assessment and education complete, DC therapy    Frequency       Co-evaluation               AM-PAC PT "6 Clicks" Mobility  Outcome Measure Help needed turning from your back to your side while in a flat bed without using bedrails?: None Help needed moving from lying on your back to sitting on the side of a flat bed without using bedrails?: A Little Help needed moving to and from a bed to a chair (including a wheelchair)?: None Help needed standing up from a chair using your arms (e.g., wheelchair or bedside chair)?: None Help needed to walk in hospital room?: None Help needed climbing 3-5 steps with a railing? : A Little 6 Click Score: 22    End of Session Equipment Utilized During Treatment: Gait belt Activity Tolerance: Patient tolerated treatment well Patient left: in bed;with call bell/phone within reach;with family/visitor present Nurse Communication: Mobility status      Time: 1100-1115 PT Time Calculation (min) (ACUTE ONLY): 15 min   Charges:   PT Evaluation $PT Eval Low Complexity: 1 Low   PT General Charges $$ ACUTE PT VISIT: 1 Visit        Tamala Ser PT 07/05/2023  Acute Rehabilitation Services  Office [phone]

## 2023-07-05 NOTE — Discharge Summary (Signed)
Physician Discharge Summary  Dana Dyer VHQ:[id number] DOB: [birth year] DOA: 07/01/2023  PCP: Karie Georges, MD  Admit date: 07/01/2023 Discharge date: 07/05/2023  Admitted From: Home Disposition: Home  Recommendations for Outpatient Follow-up:  Follow up with PCP in 1-2 weeks Outpatient follow-up with GI, Dr. Barron Alvine as scheduled on 07/10/2023 Will need MR liver/MRCP outpatient for further evaluation of multilocular cystic lesion to the pancreas Please obtain CMP in one week Please follow up on the following pending results: AFP that was pending at time of discharge  Home Health: No Equipment/Devices: None  Discharge Condition: Stable CODE STATUS: Full code Diet recommendation: Heart healthy/low-salt/consistent carbohydrate diet  History of present illness:  Dana Dyer is a 72 year old female with past medical history significant for Nash cirrhosis, HTN, GERD who presented to [facility] ED on 7/27 with confusion and falls.  Husband reports ongoing for the last several weeks.  Workup in the ED with ammonia level 103.  CT Abd/pelvis notable for cirrhosis with portal hypertension, splenomegaly, ascites and multiple varices and new 1.2 cm hypodensity left liver lobe.  CT head with no acute findings.  TRH consulted for admission for further evaluation management of hepatic encephalopathy.  Hospital course:  Hepatic encephalopathy Patient presenting to ED with progressive confusion, falls at home.  Ammonia level elevated 103 and CT abdomen/pelvis notable for cirrhosis, with portal hypertension, splenomegaly, ascites and multiple varices with new 1.2 cm hypodensity left lower lobe.  Patient's lactulose was increased and GI was consulted and followed during hospital course.  Patient's confusion improved with ammonia level improving to 45 at time of discharge.  GI recommended to continue lactulose with 3 tablespoons p.o. every morning, 2 tablespoons at lunch and 2  tablespoons at dinner.  Continue home rifaximin 550 mg p.o. twice daily.  Goal to achieve at least 3 loose bowel movements per day.  Patient has appointment with GI, Dr. Barron Alvine on 07/10/2023.  Hypodensity left lobe of liver Multilocular cystic lesion pancreas CT abdomen/pelvis with new 1.2 cm hypodensity left lobe of liver, noted.  GI recommended outpatient MR/MRCP.  NASH cirrhosis Continue furosemide 20 mg p.o. twice daily, spironolactone 50 mg p.o. daily.  Essential hypertension Continue furosemide, spironolactone, metoprolol,  Type 2 diabetes mellitus Continue Lantus 30 units obviously daily, Humalog 20 units 3 times daily AC, metformin.  Anxiety/depression: Continue Lexapro 5 mg p.o. daily   Discharge Diagnoses:  Principal Problem:   Hepatic encephalopathy (HCC) Active Problems:   Controlled type 2 diabetes mellitus without complication, without long-term current use of insulin (HCC)   Thrombocytopenia (HCC)   Cirrhosis (HCC)   Type 2 diabetes mellitus with hyperglycemia, with long-term current use of insulin (HCC)   Hyponatremia   Encephalopathy, hepatic (HCC)   AKI (acute kidney injury) [facility])    Discharge Instructions  Discharge Instructions     Call MD for:  difficulty breathing, headache or visual disturbances   Complete by: As directed    Call MD for:  extreme fatigue   Complete by: As directed    Call MD for:  persistant dizziness or light-headedness   Complete by: As directed    Call MD for:  persistant nausea and vomiting   Complete by: As directed    Call MD for:  severe uncontrolled pain   Complete by: As directed    Call MD for:  temperature >100.4   Complete by: As directed    Diet - low sodium heart healthy   Complete by: As directed  Increase activity slowly   Complete by: As directed       Allergies as of 07/05/2023       Reactions   Amoxicillin-pot Clavulanate    Severe diarrhea   Codeine Sulfate    REACTION: "hyper"   Diflucan  [fluconazole] Hives   Epinephrine Other (See Comments)   Racing heart rate   Erythromycin Base Diarrhea, Other (See Comments)   cramps   Invokana [canagliflozin] Other (See Comments)   Recurrent yeast infections   Irbesartan-hydrochlorothiazide Other (See Comments)   Unknown reaction   Victoza [liraglutide] Nausea Only   Latex Rash   Long term use   Ozempic (0.25 Or 0.5 Mg-dose) [semaglutide(0.25 Or 0.5mg -dos)] Nausea Only        Medication List     TAKE these medications    BD Pen Needle Nano 2nd Gen 32G X 4 MM Misc Generic drug: Insulin Pen Needle 1 Device by Other route in the morning, at noon, in the evening, and at bedtime.   clobetasol cream 0.05 % Commonly known as: TEMOVATE Apply 1 Application topically 2 (two) times daily as needed (yeast infection).   Dexcom G7 Sensor Misc 1 Device by Does not apply route as directed. Every 10 days   escitalopram 10 MG tablet Commonly known as: LEXAPRO Take 0.5 tablets (5 mg total) by mouth daily.   furosemide 20 MG tablet Commonly known as: LASIX Take 20 mg by mouth 2 (two) times daily. Hold if wt. Below 149 lbs.   HumaLOG KwikPen 200 UNIT/ML KwikPen Generic drug: insulin lispro Max daily 60 units What changed:  how much to take how to take this when to take this   lactulose 10 GM/15ML solution Commonly known as: CHRONULAC 3 tablespoons for breakfast, 2 tablespoons with lunch and dinner What changed: See the new instructions.   Lantus SoloStar 100 UNIT/ML Solostar Pen Generic drug: insulin glargine Inject 26 Units into the skin daily. What changed: how much to take   metFORMIN 1000 MG tablet Commonly known as: GLUCOPHAGE Take 1,000 mg by mouth 2 (two) times daily with a meal.   metFORMIN 750 MG 24 hr tablet Commonly known as: GLUCOPHAGE-XR Take 1 tablet (750 mg total) by mouth 2 (two) times daily.   metoprolol tartrate 25 MG tablet Commonly known as: LOPRESSOR Take 0.5 tablets (12.5 mg total) by mouth 2  (two) times daily.   mirtazapine 15 MG disintegrating tablet Commonly known as: REMERON SOL-TAB Start with 1/2 tablet daily at bedtime for 1 week, then increase to 1 tablet daily at bedtime. What changed:  how much to take how to take this when to take this   omeprazole 20 MG capsule Commonly known as: PRILOSEC Take 20 mg by mouth daily as needed (heartburn).   rifaximin 550 MG Tabs tablet Commonly known as: XIFAXAN Take 1 tablet (550 mg total) by mouth 2 (two) times daily.   spironolactone 50 MG tablet Commonly known as: ALDACTONE TAKE 1 TABLET BY MOUTH EVERY DAY   vitamin k 100 MCG tablet Take 100 mcg by mouth daily.   zinc gluconate 50 MG tablet Take 1 tablet (50 mg total) by mouth daily.        Follow-up Information     Karie Georges, MD. Schedule an appointment as soon as possible for a visit in 1 week(s).   Specialty: Family Medicine Contact information: [address] [place] [address] [phone]         Shellia Cleverly, DO. Go on 07/10/2023.  Specialty: Gastroenterology Contact information: [address] [place] [address] [phone]                Allergies  Allergen Reactions   Amoxicillin-Pot Clavulanate     Severe diarrhea   Codeine Sulfate     REACTION: "hyper"   Diflucan [Fluconazole] Hives   Epinephrine Other (See Comments)    Racing heart rate   Erythromycin Base Diarrhea and Other (See Comments)    cramps   Invokana [Canagliflozin] Other (See Comments)    Recurrent yeast infections   Irbesartan-Hydrochlorothiazide Other (See Comments)    Unknown reaction   Victoza [Liraglutide] Nausea Only   Latex Rash    Long term use   Ozempic (0.25 Or 0.5 Mg-Dose) [Semaglutide(0.25 Or 0.5mg -Dos)] Nausea Only    Consultations: Gastroenterology   Procedures/Studies: DG CHEST PORT 1 VIEW  Result Date: 07/02/2023 CLINICAL DATA:  [address] with cough. EXAM: PORTABLE CHEST 1 VIEW COMPARISON:  Chest CT yesterday.  FINDINGS: The lungs are clear with mild elevation of the right hemidiaphragm. There is no substantial pleural effusion. The cardiomediastinal silhouette and vascular pattern are normal. No new osseous findings. IMPRESSION: No active disease. Electronically Signed   By: Almira Bar M.D.   On: 07/02/2023 07:49   CT CHEST ABDOMEN PELVIS W CONTRAST  Result Date: 07/01/2023 CLINICAL DATA:  Nausea and vomiting, lethargic for 1 month, altered level of consciousness for 1 week, history of cirrhosis EXAM: CT CHEST, ABDOMEN, AND PELVIS WITH CONTRAST TECHNIQUE: Multidetector CT imaging of the chest, abdomen and pelvis was performed following the standard protocol during bolus administration of intravenous contrast. RADIATION DOSE REDUCTION: This exam was performed according to the departmental dose-optimization program which includes automated exposure control, adjustment of the mA and/or kV according to patient size and/or use of iterative reconstruction technique. CONTRAST:  OMNIPAQUE IOHEXOL 300 MG/ML  SOLN COMPARISON:  07/29/2022 FINDINGS: CT CHEST FINDINGS Cardiovascular: The heart is unremarkable without pericardial effusion. Calcification of the aortic and mitral valves. No evidence of thoracic aortic aneurysm or dissection. Atherosclerosis of the aortic arch. Mediastinum/Nodes: Fluid-filled esophagus could reflect sequela of gastroesophageal reflux. Thyroid and trachea are unremarkable. No pathologic adenopathy. Lungs/Pleura: No airspace disease, effusion, or pneumothorax. Central airways are patent. Musculoskeletal: No acute or destructive bony abnormalities. Reconstructed images demonstrate no additional findings. CT ABDOMEN PELVIS FINDINGS Hepatobiliary: Cirrhosis. Indeterminate 1.2 cm hypodensity segment 4 B left lobe liver image 39/2, new since prior exam. Further evaluation with nonemergent outpatient liver MRI may be useful to exclude underlying neoplasm. Calcified gallstone without evidence of  cholecystitis. No biliary duct dilation. Pancreas: Multilocular cystic lesion within the uncinate process of the pancreas measuring 4.0 x 3.9 cm, not appreciably changed since prior study. No evidence of acute pancreatitis. No pancreatic duct dilation. Spleen: Stable borderline splenomegaly, with spleen measuring up to 14.6 cm in AP dimension. No focal parenchymal abnormality. Adrenals/Urinary Tract: Stable appearance of the kidneys. No urinary tract calculi or obstructive uropathy. Adrenals and bladder are unremarkable. Stomach/Bowel: No bowel obstruction or ileus. Diverticulosis of the distal descending and sigmoid colon without evidence of diverticulitis. Mild diffuse bowel wall thickening likely due to hypoproteinemic state and adjacent ascites. Distended fluid-filled stomach, with possible sequela of gastroesophageal reflux with fluid-filled thoracic esophagus as above. Vascular/Lymphatic: The splenic vein, portal vein, and SMV are patent. Sequela of portal venous hypertension again noted, with marked splenic and gastroesophageal varices again identified. There is recanalization of the umbilical vein. No pathologic adenopathy. Reproductive: Status post hysterectomy. No adnexal masses. Other:  Trace ascites within the lower abdomen and pelvis. No free intraperitoneal gas. Stable small umbilical hernia containing mesenteric fat and ascites. No bowel herniation. Musculoskeletal: No acute or destructive bony abnormalities. Reconstructed images demonstrate no additional findings. IMPRESSION: 1. Cirrhosis, with portal venous hypertension manifested by splenomegaly, ascites, and multiple varices as above. 2. New 1.2 cm hypodensity within segment IVb left lobe liver, indeterminate. Definitive characterization with nonemergent outpatient liver MRI may be useful to exclude neoplasm in this patient with chronic cirrhosis. 3. Stable multilocular cystic lesion within the uncinate process of the pancreas. Further evaluation  with EUS and tissue sampling may be useful given size. 4. Distal colonic diverticulosis without diverticulitis. 5. Distended fluid-filled stomach, with fluid-filled thoracic esophagus likely due to gastroesophageal reflux. Electronically Signed   By: Sharlet Salina M.D.   On: 07/01/2023 22:25   CT HEAD WO CONTRAST ( )  Result Date: 07/01/2023 CLINICAL DATA:  Altered mental status EXAM: CT HEAD WITHOUT CONTRAST TECHNIQUE: Contiguous axial images were obtained from the base of the skull through the vertex without intravenous contrast. RADIATION DOSE REDUCTION: This exam was performed according to the departmental dose-optimization program which includes automated exposure control, adjustment of the mA and/or kV according to patient size and/or use of iterative reconstruction technique. COMPARISON:  None Available. FINDINGS: Brain: There is no mass, hemorrhage or extra-axial collection. The size and configuration of the ventricles and extra-axial CSF spaces are normal. The brain parenchyma is normal, without acute or chronic infarction. 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 Vascular: No abnormal hyperdensity of the major intracranial arteries or dural venous sinuses. No intracranial atherosclerosis. Skull: The visualized skull base, calvarium and extracranial soft tissues are normal. Sinuses/Orbits: No fluid levels or advanced mucosal thickening of the visualized paranasal sinuses. No mastoid or middle ear effusion. The orbits are normal. IMPRESSION: 1. No acute intracranial abnormality. 2. 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 Electronically Signed   By: Deatra Robinson M.D.   On: 07/01/2023 22:07   DG Forearm Left  Result Date: 07/01/2023 CLINICAL DATA:  Fall EXAM: LEFT FOREARM - 2 VIEW COMPARISON:  None Available. FINDINGS: There is no evidence of fracture or other focal bone lesions. Soft tissues are unremarkable. IMPRESSION: Negative. Electronically Signed   By: Jasmine Pang M.D.   On:  07/01/2023 21:22     Subjective: Patient seen examined at bedside, resting comfortably.  Lying in bed.  Husband present.  Seen by GI and okay for discharge home now that encephalopathy has resolved on higher dose lactulose.  No other questions or concerns at this time.  Patient denies headache, no dizziness, no chest pain, no palpitations, no shortness of breath, no abdominal pain, no fever/chills/night sweats, no nausea/vomiting/diarrhea, no focal weakness, no fatigue, no paresthesias.  No acute events overnight per nursing staff.  Discharge Exam: Vitals:   07/04/23 2154 07/05/23 0557  BP: 111/68 106/62  Pulse: 71 77  Resp: 18 18  Temp: 97.7 F (36.5 C) 98 F (36.7 C)  SpO2: 98% 99%   Vitals:   07/04/23 0620 07/04/23 1348 07/04/23 2154 07/05/23 0557  BP: 107/67 104/61 111/68 106/62  Pulse: 76 68 71 77  Resp: 15 18 18 18   Temp: (!) 97.5 F (36.4 C) (!) 97.4 F (36.3 C) 97.7 F (36.5 C) 98 F (36.7 C)  TempSrc: Oral Oral Oral Oral  SpO2: 98% 95% 98% 99%  Weight:      Height:        Physical Exam: GEN: NAD, alert and oriented  x 3, elderly in appearance HEENT: NCAT, PERRL, EOMI, sclera clear, MMM PULM: CTAB w/o wheezes/crackles, normal respiratory effort, on room air CV: RRR w/o M/G/R GI: abd soft, NTND, NABS, no R/G/M MSK: no peripheral edema, moves all extremities independently NEURO: CN II-XII intact, no focal deficits, sensation to light touch intact PSYCH: normal mood/affect Integumentary: dry/intact, no rashes or wounds    The results of significant diagnostics from this hospitalization (including imaging, microbiology, ancillary and laboratory) are listed below for reference.     Microbiology: Recent Results (from the past 240 hour(s))  Culture, blood (Routine X 2) w Reflex to ID Panel     Status: None (Preliminary result)   Collection Time: 07/02/23 12:10 AM   Specimen: BLOOD RIGHT ARM  Result Value Ref Range Status   Specimen Description   Final    BLOOD  RIGHT ARM BOTTLES DRAWN AEROBIC AND ANAEROBIC Performed at [facility], [address], [place], [place] [address]    Special Requests   Final    Blood Culture adequate volume Performed at [facility], [address], [place], [place] [address]    Culture   Final    NO GROWTH 3 DAYS Performed at [facility], [address] [place], [place] [address]    Report Status PENDING  Incomplete  Culture, blood (Routine X 2) w Reflex to ID Panel     Status: None (Preliminary result)   Collection Time: 07/02/23 12:30 AM   Specimen: BLOOD LEFT HAND  Result Value Ref Range Status   Specimen Description   Final    BLOOD LEFT HAND BOTTLES DRAWN AEROBIC AND ANAEROBIC Performed at [facility], [address] [place], [place] [address]    Special Requests   Final    Blood Culture adequate volume Performed at [facility], [address] [place], [place] [address]    Culture   Final    NO GROWTH 3 DAYS Performed at [facility], [address], [place], [place] [address]    Report Status PENDING  Incomplete     Labs: BNP (last 3 results) No results for input(s): "BNP" in the last 8760 hours. Basic Metabolic Panel: Recent Labs  Lab 07/01/23 2040 07/02/23 0047 07/02/23 0640 07/03/23 0729 07/05/23 0826  NA 133*  --  130* 138 133*  K 3.6  --  4.0 3.7 3.8  CL 93*  --  96* 106 100  CO2 22  --  20* 22 21*  GLUCOSE 204*  --  163* 129* 143*  BUN 48*  --  43* 32* 17  CREATININE 1.13* 0.99 1.02* 0.90 0.84  CALCIUM 9.4  --  8.8* 8.7* 8.7*   Liver Function Tests: Recent Labs  Lab 07/01/23 2040 07/02/23 0640 07/03/23 0729 07/05/23 0826  AST 52* 48* 43* 49*  ALT 48* 46* 42 43  ALKPHOS 64 62 50 65  BILITOT 3.4* 3.3* 3.0* 3.1*  PROT 6.9 6.6 5.3* 6.3*  ALBUMIN 3.3* 3.1* 2.6* 2.9*   No results for input(s): "LIPASE", "AMYLASE" in the last 168 hours. Recent Labs  Lab 07/01/23 2040  07/02/23 0742 07/04/23 1216 07/05/23 0826  AMMONIA 103* 51* 64* 45*   CBC: Recent Labs  Lab 07/01/23 2040 07/02/23 0047 07/02/23 0640  WBC 7.0 6.2 6.1  NEUTROABS 4.7  --  4.0  HGB 10.1* 9.5* 9.6*  HCT 30.8* 29.2* 30.1*  MCV 86.3 86.9 87.0  PLT 114* 100* 98*   Cardiac  Enzymes: No results for input(s): "CKTOTAL", "CKMB", "CKMBINDEX", "TROPONINI" in the last 168 hours. BNP: Invalid input(s): "POCBNP" CBG: Recent Labs  Lab 07/04/23 0734 07/04/23 1137 07/04/23 1625 07/04/23 2156 07/05/23 0743  GLUCAP 117* 197* 157* 128* 136*   D-Dimer No results for input(s): "DDIMER" in the last 72 hours. Hgb A1c No results for input(s): "HGBA1C" in the last 72 hours. Lipid Profile No results for input(s): "CHOL", "HDL", "LDLCALC", "TRIG", "CHOLHDL", "LDLDIRECT" in the last 72 hours. Thyroid function studies No results for input(s): "TSH", "T4TOTAL", "T3FREE", "THYROIDAB" in the last 72 hours.  Invalid input(s): "FREET3" Anemia work up No results for input(s): "VITAMINB12", "FOLATE", "FERRITIN", "TIBC", "IRON", "RETICCTPCT" in the last 72 hours. Urinalysis    Component Value Date/Time   COLORURINE YELLOW 07/01/2023 0301   APPEARANCEUR CLEAR 07/01/2023 0301   LABSPEC >1.046 (H) 07/01/2023 0301   PHURINE 5.0 07/01/2023 0301   GLUCOSEU NEGATIVE 07/01/2023 0301   HGBUR NEGATIVE 07/01/2023 0301   HGBUR negative 01/08/2010 0826   BILIRUBINUR NEGATIVE 07/01/2023 0301   BILIRUBINUR n 06/12/2014 0916   KETONESUR NEGATIVE 07/01/2023 0301   PROTEINUR NEGATIVE 07/01/2023 0301   UROBILINOGEN 1.0 06/12/2014 0916   UROBILINOGEN 1.0 01/08/2010 0826   NITRITE NEGATIVE 07/01/2023 0301   LEUKOCYTESUR NEGATIVE 07/01/2023 0301   Sepsis Labs Recent Labs  Lab 07/01/23 2040 07/02/23 0047 07/02/23 0640  WBC 7.0 6.2 6.1   Microbiology Recent Results (from the past 240 hour(s))  Culture, blood (Routine X 2) w Reflex to ID Panel     Status: None (Preliminary result)   Collection Time:  07/02/23 12:10 AM   Specimen: BLOOD RIGHT ARM  Result Value Ref Range Status   Specimen Description   Final    BLOOD RIGHT ARM BOTTLES DRAWN AEROBIC AND ANAEROBIC Performed at [facility], [address], [place], [place] [address]    Special Requests   Final    Blood Culture adequate volume Performed at [facility], [address] [place], [place] [address]    Culture   Final    NO GROWTH 3 DAYS Performed at [facility], [address], [place], [place] [address]    Report Status PENDING  Incomplete  Culture, blood (Routine X 2) w Reflex to ID Panel     Status: None (Preliminary result)   Collection Time: 07/02/23 12:30 AM   Specimen: BLOOD LEFT HAND  Result Value Ref Range Status   Specimen Description   Final    BLOOD LEFT HAND BOTTLES DRAWN AEROBIC AND ANAEROBIC Performed at [facility], [address], [place], [place] [address]    Special Requests   Final    Blood Culture adequate volume Performed at [facility], [address] [place], [place] [address]    Culture   Final    NO GROWTH 3 DAYS Performed at [facility], [address] [place], [place] [address]    Report Status PENDING  Incomplete     Time coordinating discharge: Over 30 minutes  SIGNED:   Alvira Philips [place], DO  [facility] 07/05/2023, 11:21 AM

## 2023-07-06 ENCOUNTER — Telehealth: Payer: Self-pay

## 2023-07-06 NOTE — Transitions of Care (Post Inpatient/ED Visit) (Signed)
07/06/2023  Name: Dana Dyer MRN: [id number] DOB: Apr 08, 1951  Today's TOC FU Call Status: Today's TOC FU Call Status:: Successful TOC FU Call Completed TOC FU Call Complete Date: 07/06/23 (Call completed with spouse-Ralph.)  Transition Care Management Follow-up Telephone Call Date of Discharge: 07/05/23 Discharge Facility: [facility]) Type of Discharge: Inpatient Admission Primary Inpatient Discharge Diagnosis:: "hepatic encephalopathy" How have you been since you were released from the hospital?: Better (spouse states things going well-pt alert,eating good,has been up walking-BM today, no N&V, wgt today 141 lbs, cbg-90-denies any pain or acute issues) Any questions or concerns?: No  Items Reviewed: Did you receive and understand the discharge instructions provided?: Yes Medications obtained,verified, and reconciled?: Yes (Medications Reviewed) (Spouse reported not giving Lantus insulin-due to pt's cbgs decreased and appetite has been decreased and that pt only taking 750mg  BID of Metformin per MD recommendations) Any new allergies since your discharge?: No Dietary orders reviewed?: Yes Type of Diet Ordered:: low salt/heart healthy/carb modified Do you have support at home?: Yes People in Home: spouse Name of Support/Comfort Primary Source: Rayna Sexton  Medications Reviewed Today: Medications Reviewed Today     Reviewed by Charlyn Minerva, RN (Registered Nurse) on 07/06/23 at 1234  Med List Status: <None>   Medication Order Taking? Sig Documenting Provider Last Dose Status Informant  clobetasol cream (TEMOVATE) 0.05 % [id number] Yes Apply 1 Application topically 2 (two) times daily as needed (yeast infection). [provider] Taking Active Spouse/Significant Other  Continuous Blood Gluc Sensor (DEXCOM G7 SENSOR) MISC [id number] Yes 1 Device by Does not apply route as directed. Every 10 days Shamleffer, Konrad Dolores, MD Taking Active Spouse/Significant Other   escitalopram (LEXAPRO) 10 MG tablet [id number] No Take 0.5 tablets (5 mg total) by mouth daily.  Patient not taking: Reported on 07/06/2023   Karie Georges, MD Not Taking Active Spouse/Significant Other  furosemide (LASIX) 20 MG tablet [id number] No Take 20 mg by mouth 2 (two) times daily. Hold if wt. Below 149 lbs.  Patient not taking: Reported on 07/06/2023   [provider] Not Taking Active Spouse/Significant Other  insulin glargine (LANTUS SOLOSTAR) 100 UNIT/ML Solostar Pen [id number]  Inject 26 Units into the skin daily.  Patient taking differently: Inject 30 Units into the skin daily.   Shamleffer, Konrad Dolores, MD  Active Spouse/Significant Other  insulin lispro (HUMALOG KWIKPEN) 200 UNIT/ML KwikPen [id number] Yes Max daily 60 units  Patient taking differently: Inject 20 Units into the skin 3 (three) times daily. Max daily 60 units   Shamleffer, Konrad Dolores, MD Taking Active Spouse/Significant Other  Insulin Pen Needle (BD PEN NEEDLE NANO 2ND GEN) 32G X 4 MM MISC [id number] Yes 1 Device by Other route in the morning, at noon, in the evening, and at bedtime. Shamleffer, Konrad Dolores, MD Taking Active Spouse/Significant Other  lactulose (CHRONULAC) 10 GM/15ML solution [id number] Yes 3 tablespoons for breakfast, 2 tablespoons with lunch and dinner [place], Eric J, DO Taking Active   metFORMIN (GLUCOPHAGE) 1000 MG tablet [id number] No Take 1,000 mg by mouth 2 (two) times daily with a meal.  Patient not taking: Reported on 07/06/2023   [provider] Not Taking Active Spouse/Significant Other  metFORMIN (GLUCOPHAGE-XR) 750 MG 24 hr tablet [id number] Yes Take 1 tablet (750 mg total) by mouth 2 (two) times daily. Shamleffer, Konrad Dolores, MD Taking Active Spouse/Significant Other           Med Note Cristobal Goldmann Jul 02, 2023  3:23 PM) Not started  metoprolol tartrate (LOPRESSOR) 25 MG tablet [id number] Yes Take 0.5 tablets (12.5 mg total) by mouth 2 (two) times  daily. Nahser, Deloris Ping, MD Taking Active Spouse/Significant Other  mirtazapine (REMERON SOL-TAB) 15 MG disintegrating tablet [id number] Yes Start with 1/2 tablet daily at bedtime for 1 week, then increase to 1 tablet daily at bedtime.  Patient taking differently: Take 7.5 mg by mouth at bedtime. Start with 1/2 tablet daily at bedtime for 1 week, then increase to 1 tablet daily at bedtime.   Karie Georges, MD Taking Active Spouse/Significant Other  omeprazole (PRILOSEC) 20 MG capsule [id number] Yes Take 20 mg by mouth daily as needed (heartburn). [provider] Taking Active Spouse/Significant Other  rifaximin (XIFAXAN) 550 MG TABS tablet [id number] Yes Take 1 tablet (550 mg total) by mouth 2 (two) times daily. Cirigliano, Gae Bon V, DO Taking Active Spouse/Significant Other  spironolactone (ALDACTONE) 50 MG tablet [id number] Yes TAKE 1 TABLET BY MOUTH EVERY DAY Cirigliano, Vito V, DO Taking Active Spouse/Significant Other  vitamin k 100 MCG tablet [id number] Yes Take 100 mcg by mouth daily. [provider] Taking Active Spouse/Significant Other  zinc gluconate 50 MG tablet [id number] Yes Take 1 tablet (50 mg total) by mouth daily. Doree Albee, PA-C Taking Active Spouse/Significant Other            Home Care and Equipment/Supplies: Were Home Health Services Ordered?: Yes Name of Home Health Agency:: [facility] Has Agency set up a time to come to your home?: No (Spouse provided with contact info to agency-advised to follow up with agency if no call from them within 48-72hrs post-discharge) Any new equipment or medical supplies ordered?: NA  Functional Questionnaire: Do you need assistance with bathing/showering or dressing?: Yes Do you need assistance with meal preparation?: Yes Do you need assistance with eating?: No Do you have difficulty maintaining continence: No Do you need assistance with getting out of bed/getting out of a chair/moving?: Yes Do you have difficulty  managing or taking your medications?: Yes  Follow up appointments reviewed: PCP Follow-up appointment confirmed?: No (Spouse declined-states "no need as PCP not managing liver problems and has deferred pt to specialist") MD Provider Line Number:[phone] Given: No Specialist Hospital Follow-up appointment confirmed?: Yes Date of Specialist follow-up appointment?: 07/10/23 Follow-Up Specialty Provider:: Dr. Earnest Rosier MD) Do you need transportation to your follow-up appointment?: No (spouse confirms he is able to take pt to appts) Do you understand care options if your condition(s) worsen?: Yes-patient verbalized understanding  SDOH Interventions Today    Flowsheet Row Most Recent Value  SDOH Interventions   Food Insecurity Interventions Intervention Not Indicated  Transportation Interventions Intervention Not Indicated      TOC Interventions Today    Flowsheet Row Most Recent Value  TOC Interventions   TOC Interventions Discussed/Reviewed TOC Interventions Discussed      Interventions Today    Flowsheet Row Most Recent Value  Chronic Disease   Chronic disease during today's visit Diabetes, Other  [cirrhosis]  General Interventions   General Interventions Discussed/Reviewed General Interventions Discussed, Doctor Visits, Durable Medical Equipment (DME), Referral to Nurse  [spouse requesting a nurse call to follow up with pt and provide ongoing support/education to them-follow up appt scheduled & confirmed with spouse for assigned RN care coordinator]  Doctor Visits Discussed/Reviewed Doctor Visits Discussed, PCP, Specialist  Durable Medical Equipment (DME) Glucomoter, BP Cuff, Other  [scale]  PCP/Specialist Visits Compliance with follow-up visit  Education Interventions   Education Provided Provided Education  Provided Verbal Education On  Nutrition, When to see the doctor, Medication, Blood Sugar Monitoring  Nutrition Interventions   Nutrition Discussed/Reviewed  Nutrition Discussed, Adding fruits and vegetables, Increasing proteins, Fluid intake, Decreasing salt, Decreasing sugar intake, Decreasing fats  Pharmacy Interventions   Pharmacy Dicussed/Reviewed Pharmacy Topics Discussed, Medications and their functions  Safety Interventions   Safety Discussed/Reviewed Safety Discussed, Home Safety       Alessandra Grout [facility] Care Management Care Management Community Coordinator Direct Phone: [phone] Toll Free: [phone] Fax: [phone]

## 2023-07-07 ENCOUNTER — Telehealth: Payer: Self-pay | Admitting: Family Medicine

## 2023-07-07 LAB — CULTURE, BLOOD (ROUTINE X 2)
Culture: NO GROWTH
Culture: NO GROWTH
Special Requests: ADEQUATE
Special Requests: ADEQUATE

## 2023-07-07 NOTE — Telephone Encounter (Addendum)
Dana Dyer with [facility]  [phone] *Ok to leave a detailed message on this line  Received referral Pt has requested delay in start date Start Date moved to 07/11/23 - Tuesday

## 2023-07-10 ENCOUNTER — Ambulatory Visit: Payer: Medicare PPO | Admitting: Gastroenterology

## 2023-07-10 ENCOUNTER — Encounter: Payer: Self-pay | Admitting: Gastroenterology

## 2023-07-10 VITALS — BP 122/68 | HR 84 | Wt 159.0 lb

## 2023-07-10 DIAGNOSIS — I8500 Esophageal varices without bleeding: Secondary | ICD-10-CM

## 2023-07-10 DIAGNOSIS — R933 Abnormal findings on diagnostic imaging of other parts of digestive tract: Secondary | ICD-10-CM

## 2023-07-10 DIAGNOSIS — K7460 Unspecified cirrhosis of liver: Secondary | ICD-10-CM

## 2023-07-10 DIAGNOSIS — K3189 Other diseases of stomach and duodenum: Secondary | ICD-10-CM

## 2023-07-10 DIAGNOSIS — R188 Other ascites: Secondary | ICD-10-CM | POA: Diagnosis not present

## 2023-07-10 DIAGNOSIS — K7581 Nonalcoholic steatohepatitis (NASH): Secondary | ICD-10-CM | POA: Diagnosis not present

## 2023-07-10 DIAGNOSIS — K648 Other hemorrhoids: Secondary | ICD-10-CM | POA: Diagnosis not present

## 2023-07-10 DIAGNOSIS — K7682 Hepatic encephalopathy: Secondary | ICD-10-CM

## 2023-07-10 DIAGNOSIS — K641 Second degree hemorrhoids: Secondary | ICD-10-CM

## 2023-07-10 NOTE — Telephone Encounter (Signed)
Ok to delay start date

## 2023-07-10 NOTE — Telephone Encounter (Signed)
Spoke with Dana Dyer and informed her of the approval for orders as below.

## 2023-07-10 NOTE — Progress Notes (Unsigned)
Chief Complaint:    Cirrhosis  GI History: 1.  History of adenomatous polyps in her colon.  Colonoscopy 2006 Dr. Barnett Abu found no polyps.  Colonoscopy Dr. Christella Hartigan December 2016 found 7 subcentimeter polyps.  These were all adenomatous polyps on pathology.  Also documented left-sided diverticulosis.  Dr. Christella Hartigan recommended she have a repeat colonoscopy at 3-year interval.   Colonoscopy January 2020 four polyps were removed from her colon, all were adenomatous on pathology.  She was recommended to have repeat colonoscopy at 3-year interval.  Colonoscopy 03/2022 2 subcentimeter adenomas.  A 1 cm ulcer in the cecum was noted this did not appear malignant.  Biopsies showed no neoplasm, suspect from meloxicam she was taken for back pains.  Previous tattoo of colon area was normal. Recommended 5 year repeat.    2. Cirrhosis, likely from longstanding fatty liver disease, out-of-control diabetes.  Diagnosed 2019. - Lab workup 2019: Hep C Ab negative, Hep B Surface Ag negative, hepatitis B surface antibody negative, hep B Core Igm negative, Hep A IgM negative. Ferritin normal, HIV negative, ANA negative, antimitochondrial antibody negative, hepatitis a antibody total negative; slightly low platelets, transaminases slightly elevated.  No edema. - [place] 07/2018: slightly nodular, suspicious for cirrhosis. - [place] 12/2019 cirrhosis without mass lesions. - Ultrasound 08/2020 showed cirrhosis without focal mass lesions. Gallbladder thick but without Murphy sign. - Ultrasound May 2022 showed cirrhosis, trace perihepatic ascites, no focal lesions in the liver. - [place] 12/2021 nodular liver, no masses.  - EGD 12/2018 small distal esophagus varices, mild portal gastropathy, no gastric varices.  She was started on nadolol (eventually at 40mg  daily). - EGD 03/2022 small esophagus varices, mild portal gastropathy.  Biopsies of minimal gastritis showed no H. pylori - AFP 11/2021 normal - Hepatitis A, B vaccination series started  11/2018 -11/2021: Mild encephalopathy, memory disturbance, slight tremors, ammonia 65. Started lactulose which might have raised her blood sugars a bit. -07/29/2022 - 08/01/2022: Hospital admission for hyponatremia 2/2 hypovolemia.  NA 120 on admission, improved to 130s by DC.  Discharged home with salt tablets to hold diuretics (was actually diuretics prior to hospitalization) -07/29/2022: CT A/P: Cirrhotic appearing liver, no HCC.  Evidence of portal hypertension (varices, splenorenal shunt, small volume ascites, mild splenomegaly), multiple cystic lesions in the uncinate process of the pancreas measuring up to 4.1 cm favoring sidebranch IPMN's.  Patient was not found to be a candidate for EUS based on hyponatremia and recommended MRI repeat in 3 months - 08/15/2022: Abdominal ultrasound: New small to moderate ascites, GB wall thickening related to ascites/hepatic dysfunction.  Cirrhosis without HCC.  Restart Aldactone 25 mg qod and Lasix 10 mg qod (to take on alternating days).  Placed referral to Nephrology for medication intolerant ascites - 04/19/2023: BUN/creatinine 31/1.23.  Decreased Lasix/Aldactone to 10 mg / 25 mg with good serologic response.  Ammonia was 121 despite lactulose/rifaximin; attributed to renal dysfunction.     Cirrhosis Evaluation: - Etiology: NASH - Complications: Hepatic encephalopathy, hyponatremia, ascites, diuretic intolerance, esophageal varices, portal hypertensive gastropathy - HCC screening: UTD - Variceal screening: EGD 03/2022: Small esophageal varices - Serologic evaluation: Negative/normal as above - Viral hepatitis vaccination: UTD - Flu vaccine: UTD - Liver biopsy: None - Medications: Lactulose, metoprolol, Lasix 20 mg bid, Aldactone 50 mg/day.  Was previously prescribed rifaximin, but today reports she has not been taking this - MELD 3.0: 15 - Child Pugh score: B   3.  Symptomatic hemorrhoids Colonoscopy in 03/2022 with internal hemorrhoids.  Hemorrhoid  symptoms include  intermittent scant BRBPR, rectal itching, irritation.  Symptoms not fully responsive to adequate trial of topical therapy. - 03/13/2023: Banding of RP hemorrhoid - 05/03/2023: Banding of LL hemorrhoid   4.   GERD Predominantly nocturnal reflux symptoms.  Index symptoms of regurgitation, choking sensation, cough that wakes her from sleep.  No history of erosive esophagitis.  Increased Pepcid 20 mg to bid dosing in 03/2023 with control of nocturnal breakthrough symptoms.  HPI:     Patient is a 72 y.o. female presenting [facility] for hospital follow-up.  She was admitted 7/27-31 with confusion and falls over couple of weeks and nausea/vomiting a few days prior to admission.  Hospital admission notable for the following: - Ammonia 103.  Lactulose increased and confusion improved with repeat ammonia level 45 at time of discharge.  Discharged with lactulose 3 tablespoons every morning, 2 tablespoons at lunch and 2 tablespoons at dinner and continue home rifaximin - CT C/A/P: Cirrhotic appearing liver with portal hypertension, splenomegaly, ascites, multiple varices with new 1.2 cm hypodensity in the left lower lobe.  Pancreas with multilocular cystic lesion in the uncinate process measuring 4.0 x 3.9 cm, not appreciably changed since previous study.  Recommended outpatient MRI/MRCP *** - Continued on furosemide 20 mg bid and spironolactone 50 mg daily - Vomiting resolved on admission  Today, she states   Hemorrhoids were worse in the hospital, and still flaring.   Watery stools with the lactulose   Presents to the appointment with her daughter and husband.   Review of systems:     No chest pain, no SOB, no fevers, no urinary sx   Past Medical History:  Diagnosis Date   ALLERGIC RHINITIS 10/09/2007   Allergy    Arthritis    hips, spine   DIABETES MELLITUS, UNCONTROLLED 03/16/2009   FATIGUE 10/21/2008   FIBROMYALGIA 10/09/2007   GERD (gastroesophageal  reflux disease)    diet  controlled, no meds   H/O vaginal hysterectomy    HYPERLIPIDEMIA 10/09/2007   diet controlled, no meds   HYPERTENSION 10/09/2007   Morton's neuroma    OBSTRUCTIVE SLEEP APNEA 10/09/2007   Palpitations    Plantar fasciitis    History - right   Sleep apnea    Uses CPAP   TRANSAMINASES, SERUM, ELEVATED 07/08/2009    Patient's surgical history, family medical history, social history, medications and allergies were all reviewed in Epic    Current Outpatient Medications  Medication Sig Dispense Refill   clobetasol cream (TEMOVATE) 0.05 % Apply 1 Application topically 2 (two) times daily as needed (yeast infection).     Continuous Blood Gluc Sensor (DEXCOM G7 SENSOR) MISC 1 Device by Does not apply route as directed. Every 10 days 9 each 3   furosemide (LASIX) 20 MG tablet Take 20 mg by mouth 2 (two) times daily. Hold if wt. Below 149 lbs.     Insulin Pen Needle (BD PEN NEEDLE NANO 2ND GEN) 32G X 4 MM MISC 1 Device by Other route in the morning, at noon, in the evening, and at bedtime. 400 each 3   lactulose (CHRONULAC) 10 GM/15ML solution 3 tablespoons for breakfast, 2 tablespoons with lunch and dinner 2430 mL 1   metoprolol tartrate (LOPRESSOR) 25 MG tablet Take 0.5 tablets (12.5 mg total) by mouth 2 (two) times daily. 90 tablet 3   omeprazole (PRILOSEC) 20 MG capsule Take 20 mg by mouth daily as needed (heartburn).     rifaximin (XIFAXAN) 550 MG TABS tablet Take 1 tablet (  550 mg total) by mouth 2 (two) times daily. 180 tablet 3   spironolactone (ALDACTONE) 50 MG tablet TAKE 1 TABLET BY MOUTH EVERY DAY 90 tablet 1   vitamin k 100 MCG tablet Take 100 mcg by mouth daily.     zinc gluconate 50 MG tablet Take 1 tablet (50 mg total) by mouth daily. 30 tablet 3   escitalopram (LEXAPRO) 10 MG tablet Take 0.5 tablets (5 mg total) by mouth daily. (Patient not taking: Reported on 07/06/2023)     insulin glargine (LANTUS SOLOSTAR) 100 UNIT/ML Solostar Pen Inject 26 Units into  the skin daily. (Patient not taking: Reported on 07/06/2023) 30 mL 3   insulin lispro (HUMALOG KWIKPEN) 200 UNIT/ML KwikPen Max daily 60 units (Patient not taking: Reported on 07/10/2023) 30 mL 3   metFORMIN (GLUCOPHAGE) 1000 MG tablet Take 1,000 mg by mouth 2 (two) times daily with a meal. (Patient not taking: Reported on 07/06/2023)     metFORMIN (GLUCOPHAGE-XR) 750 MG 24 hr tablet Take 1 tablet (750 mg total) by mouth 2 (two) times daily. (Patient not taking: Reported on 07/10/2023) 180 tablet 3   mirtazapine (REMERON SOL-TAB) 15 MG disintegrating tablet Start with 1/2 tablet daily at bedtime for 1 week, then increase to 1 tablet daily at bedtime. (Patient not taking: Reported on 07/10/2023) 90 tablet 0   No current facility-administered medications for this visit.    Physical Exam:     BP 122/68   Pulse 84   Wt 159 lb (72.1 kg)   BMI 28.17 kg/m   GENERAL:  Pleasant *** in NAD PSYCH: : Cooperative, normal affect EENT:  conjunctiva pink, mucous membranes moist, neck supple without masses CARDIAC:  RRR, ***murmur heard, no peripheral edema PULM: Normal respiratory effort, lungs CTA bilaterally, no wheezing ABDOMEN:  Nondistended, soft, nontender. No obvious masses, no hepatomegaly,  normal bowel sounds SKIN:  turgor, no lesions seen Musculoskeletal:  Normal muscle tone, normal strength NEURO: Alert and oriented x 3, no focal neurologic deficits   IMPRESSION and PLAN:    1)   -MRI/MRCP for evaluation of new 1.2 cm hypodensity in segment Ivb of the left lobe of the liver.  Will also be able to follow-up on the known multilocular cystic lesion in the uncinate process of the pancreas without imaging   MELD 3.0: 15 at 07/03/2023  7:29 AM MELD-Na: 12 at 07/03/2023  7:29 AM Calculated from: Serum Creatinine: 0.90 mg/dL (Using min of 1 mg/dL) at 02/09/6577  4:69 AM Serum Sodium: 138 mmol/L (Using max of 137 mmol/L) at 07/03/2023  7:29 AM Total Bilirubin: 3.0 mg/dL at 06/03/5283  1:32 AM Serum  Albumin: 2.6 g/dL at 4/40/1027  2:53 AM INR(ratio): 1.1 at 07/02/2023  6:40 AM Age at listing (hypothetical): 70 years Sex: Female at 07/03/2023  7:29 AM   -It is important that you have a relatively low salt diet.  Limit to less than 2000 mg/day.  High salt diet can cause fluid to accumulate in your legs, abdomen and even around your lungs. -High-protein diet from primarily plant-based foods.  Avoid red meat.  No raw or undercooked meat, seafood, or shellfish. -Low-fat/cholesterol/carbohydrate diet.  -Try to avoid NSAID type over the counter pain medicines as best as possible. -Tylenol is safe to take for 'routine' aches and pains, but never take more than 1/2 the dose suggested on the package instructions (never more than 2 grams per day). -Avoid alcohol. -Recommend 30 minutes of aerobic and/or resistance exercise 3 days/week. -Daily multivitamin. -Consider addition  of branched chain amino acids (12 g daily) - Continue zinc, but increase to 220 mg 3 times daily, as studies have demonstrated benefit in reducing hepatic encephalopathy -Start levocarnitine 330 mg 3 times daily, as studies have demonstrated benefit in reducing hepatic encephalopathy   Check ammonia next week Slight change in lactulose Referral to Hep  MRI/MRCP Anusol supp x7 days Desitin cream  RTC in 3 months or sooner prn            Verlin Dike  ,DO, FACG 07/10/2023, 11:55 AM

## 2023-07-10 NOTE — Patient Instructions (Addendum)
Start levocarnitine 330 mg 3 times daily Continue zinc supplement at 220 mg 3 times daily  You will be contacted by [facility] Scheduling in the next 2 days to arrange an appointment for MRI/MRCP.  The number on your caller ID will be [phone], please answer when they call.  If you have not heard from them in 2 days please call [phone] to schedule.     Due for lab in 1 week.  You have been scheduled for an appointment with Dr. Barron Alvine on 10/10/23 at 3 :20 AM . Please arrive 10 minutes early for your appointment.   You will be contacted by [facility].  Ph# [phone]   _______________________________________________________  If your blood pressure at your visit was 140/90 or greater, please contact your primary care physician to follow up on this.  _______________________________________________________  If you are age 93 or older, your body mass index should be between 23-30. Your Body mass index is 28.17 kg/m. If this is out of the aforementioned range listed, please consider follow up with your Primary Care Provider.  _________________________________________________________  The [facility] providers would like to encourage you to use [facility] to communicate with providers for non-urgent requests or questions.  Due to long hold times on the telephone, sending your provider a message by [facility] may be a faster and more efficient way to get a response.  Please allow 48 business hours for a response.  Please remember that this is for non-urgent requests.    Due to recent changes in healthcare laws, you may see the results of your imaging and laboratory studies on MyChart before your provider has had a chance to review them.  We understand that in some cases there may be results that are confusing or concerning to you. Not all laboratory results come back in the same time frame and the provider may be waiting for multiple results in order to  interpret others.  Please give [place] 48 hours in order for your provider to thoroughly review all the results before contacting the office for clarification of your results.    Thank you for choosing me and [facility].  Vito Cirigliano, D.O.

## 2023-07-11 ENCOUNTER — Telehealth: Payer: Self-pay | Admitting: Family Medicine

## 2023-07-11 ENCOUNTER — Telehealth: Payer: Self-pay | Admitting: Gastroenterology

## 2023-07-11 ENCOUNTER — Ambulatory Visit: Payer: Medicare PPO | Admitting: Internal Medicine

## 2023-07-11 MED ORDER — HYDROCORTISONE ACETATE 25 MG RE SUPP: 25.0000 mg | Freq: Every day | RECTAL | 1 refills | Status: DC

## 2023-07-11 NOTE — Telephone Encounter (Signed)
Please look to see if we can get her in sooner. She needs to increase her furosemide -- she is supposed to be on 20 mg twice a day but she can take 40 mg twice a day until her weight comes down if she needs to.

## 2023-07-11 NOTE — Telephone Encounter (Signed)
Pt referred to [facility] from the hospital.  Doesn't need home health after evaluation.   Pt has gained 6-7 lbs of fluid in one week--3 lbs since yesterday, states she feels that patient needs to be seen sooner than appointment that she has scheduled already.

## 2023-07-11 NOTE — Telephone Encounter (Signed)
Spoke with Dana Dyer and informed her of the message below.  Dana Dyer stated she will not be going back to the patient's home at this point.  I left a message for the patient to return my call in order to schedule an earlier appt.

## 2023-07-11 NOTE — Telephone Encounter (Signed)
Anusol Suppository is sent to [facility]. Patient made aware.

## 2023-07-11 NOTE — Telephone Encounter (Signed)
I called the patient back as I see she was scheduled for an appt on 8/8.  Patient was informed of the advice below per PCP and agreed to increase the dosage of Furosemide.

## 2023-07-11 NOTE — Telephone Encounter (Signed)
PT was prescribed a suppository at yesterdays appointment and it was not called in. Script should be sent to [facility] on [address].

## 2023-07-12 ENCOUNTER — Ambulatory Visit: Payer: Self-pay

## 2023-07-12 NOTE — Patient Outreach (Signed)
  Care Coordination   Initial Visit Note   07/12/2023 Name: Dana Dyer MRN: [id number] DOB: 1951/06/22  Dana Dyer is a 72 y.o. year old female who sees Dana Georges, MD for primary care. I spoke with  Dana Dyer by phone today.  Patient doing okay. They have company right now and wants to talk another time.    What matters to the patients health and wellness today?  Getting better      SDOH assessments and interventions completed:  No      Care Coordination Interventions:  Yes, provided   Follow up plan:  RN CM will follow up with spouse possible later in the week or next week.    Encounter Outcome:  Pt. Visit Completed   Dana Leriche, RN, MSN [facility] Care Management Care Management Coordinator Direct Line [phone]

## 2023-07-12 NOTE — Patient Instructions (Signed)
Visit Information  Thank you for taking time to visit with me today. Please don't hesitate to contact me if I can be of assistance to you.    If you are experiencing a Mental Health or Behavioral Health Crisis or need someone to talk to, please call the Suicide and Crisis Lifeline: 988   Patient verbalizes understanding of instructions and care plan provided today and agrees to view in MyChart. Active MyChart status and patient understanding of how to access instructions and care plan via MyChart confirmed with patient.     The patient has been provided with contact information for the care management team and has been advised to call with any health related questions or concerns.   Bary Leriche, RN, MSN [facility] Care Management Care Management Coordinator Direct Line [phone]

## 2023-07-13 ENCOUNTER — Encounter: Payer: Self-pay | Admitting: Family Medicine

## 2023-07-13 ENCOUNTER — Ambulatory Visit: Payer: Medicare PPO | Admitting: Family Medicine

## 2023-07-13 VITALS — BP 110/70 | HR 87 | Temp 98.0°F | Wt 163.5 lb

## 2023-07-13 DIAGNOSIS — R188 Other ascites: Secondary | ICD-10-CM | POA: Diagnosis not present

## 2023-07-13 DIAGNOSIS — K7469 Other cirrhosis of liver: Secondary | ICD-10-CM

## 2023-07-13 MED ORDER — SPIRONOLACTONE 50 MG PO TABS: 50.0000 mg | ORAL_TABLET | Freq: Every day | ORAL | 1 refills | Status: DC

## 2023-07-13 MED ORDER — FUROSEMIDE 20 MG PO TABS
20.0000 mg | ORAL_TABLET | Freq: Two times a day (BID) | ORAL | 1 refills | Status: DC
Start: 2023-07-13 — End: 2023-08-10

## 2023-07-13 NOTE — Assessment & Plan Note (Signed)
Secondary to NASH,  recent hospitalization for encephalopathy. Her abdomen is very distended today, much more than previous and her weight is up 4 pounds from 8/5. I advised we increase her diuretics, 2 tablets BID of furosemide. I advised her husband to watch her blood pressure carefully and stop the diuretics if her blood pressure drops below 100 . When she returns to her normal weight she can resume her regular 20 mg BID dosing. She is scheduled for bloodwork on Monday already.   I messaged Dr. Barron Alvine and discussed the case with him. He advised getting abdominal US to look at the extent of the ascites. I have placed the order for this. She may need CT of the abd to look for intestinal edema. Will get the blood work and the [place] abd for now.

## 2023-07-13 NOTE — Patient Instructions (Signed)
Increase to furosemide to 40 mg (2 tablets) twice a day -- please make sure blood pressure is greater than 100/ 70 before giving the furosemide, then once her weight decreases then ok to back down to 1 tablet twice a day (20 mg)..  Increasing spironolactone to 1 tablet twice a day, also checking blood pressure before you give the second dose to make sure she will tolerate it.

## 2023-07-13 NOTE — Progress Notes (Signed)
Established Patient Office Visit  Subjective   Patient ID: Dana Dyer, female    DOB: [birth year]  Age: 72 y.o. MRN: [id number]  Chief Complaint  Patient presents with   stomach distended    Started 2 days ago, patient denies any pain, feels uncomfortable     Patient is here for hospital follow up and 6 week follow up. She was admitted from 7/25-7/31 due to hepatice encephalopathy. She was treated in the hospital and her numbers improved. I reviewed her discharge summary, also her notes from GI  from 8/5. Patient reports her legs swelling is better but her abdomen is very distended, had trouble with some SOB during physical therapy this week.  I reviewed her weights, she is up to 163 pounds today (up 4 pounds from 3 days ago). She reports she continues to have multiple bowel movements daily with her lactulose.   Decrease appetite-- pt reports she is off the lexapro, only taking the mirtazapine 15 mg at bedtime. She reports it is working well, does feel a little more hungry, husband reports he is feeding her more protein. She is sleeping well at night,    Current Outpatient Medications  Medication Instructions   clobetasol cream (TEMOVATE) 0.05 % 1 Application, Topical, 2 times daily PRN   Continuous Blood Gluc Sensor (DEXCOM G7 SENSOR) MISC 1 Device, Does not apply, As directed, Every 10 days   furosemide (LASIX) 20-40 mg, Oral, 2 times daily, Hold if wt. Below 149 lbs.   hydrocortisone (ANUSOL-HC) 25 mg, Rectal, Daily at bedtime   insulin lispro (HUMALOG KWIKPEN) 200 UNIT/ML KwikPen Max daily 60 units   Insulin Pen Needle (BD PEN NEEDLE NANO 2ND GEN) 32G X 4 MM MISC 1 Device, Other, 4 times daily   lactulose (CHRONULAC) 10 GM/15ML solution 3 tablespoons for breakfast, 2 tablespoons with lunch and dinner   Lantus SoloStar 26 Units, Subcutaneous, Daily   metFORMIN (GLUCOPHAGE) 1,000 mg, 2 times daily with meals   metFORMIN (GLUCOPHAGE-XR) 750 mg, Oral, 2 times daily   metoprolol  tartrate (LOPRESSOR) 12.5 mg, Oral, 2 times daily   mirtazapine (REMERON SOL-TAB) 15 MG disintegrating tablet Start with 1/2 tablet daily at bedtime for 1 week, then increase to 1 tablet daily at bedtime.   omeprazole (PRILOSEC) 20 mg, Oral, Daily PRN   rifaximin (XIFAXAN) 550 mg, Oral, 2 times daily   spironolactone (ALDACTONE) 50-100 mg, Oral, Daily   vitamin k 100 mcg, Oral, Daily   zinc gluconate 50 mg, Oral, Daily    Patient Active Problem List   Diagnosis Date Noted   AKI (acute kidney injury) (HCC) 07/02/2023   Hepatic encephalopathy (HCC) 07/01/2023   Encephalopathy, hepatic (HCC) 10/20/2022   DOE (dyspnea on exertion) 08/19/2022   NASH (nonalcoholic steatohepatitis) 08/19/2022   Dehydration with hyponatremia 07/29/2022   Iron deficiency anemia 07/06/2022   Iron deficiency anemia due to chronic blood loss 06/28/2022   Hyperkalemia 03/17/2022   Hyponatremia 03/17/2022   Degenerative disc disease, cervical 10/05/2021   Type 2 diabetes mellitus with diabetic polyneuropathy, with long-term current use of insulin (HCC) 04/29/2020   Dyslipidemia 09/09/2019   Type 2 diabetes mellitus with hyperglycemia, with long-term current use of insulin (HCC) 09/09/2019   Thrombocytopenia (HCC) 08/23/2018   Splenomegaly 08/23/2018   Cirrhosis (HCC) 08/23/2018   Depression, major, single episode, severe (HCC) 07/31/2017   Low serum vitamin B12 07/31/2017   Degenerative disc disease, lumbar 09/19/2016   Arthritis, midfoot 04/12/2016   Tarsal tunnel syndrome of right side  03/17/2016   Tibialis posterior tendon tear, nontraumatic 02/25/2016   Biceps tendinitis on left 07/14/2015   Tremor of left hand 07/14/2015   Mild carpal tunnel syndrome of left wrist 06/23/2014   Greater trochanteric bursitis of right hip 06/23/2014   Obesity (BMI 30-39.9) 09/05/2013   Asthmatic bronchitis 08/12/2013   TRANSAMINASES, SERUM, ELEVATED 07/08/2009   Controlled type 2 diabetes mellitus without complication,  without long-term current use of insulin (HCC) 03/16/2009   Hypersomnolence 10/21/2008   Hyperlipemia 10/09/2007   Obstructive sleep apnea 10/09/2007   Essential hypertension 10/09/2007   ALLERGIC RHINITIS 10/09/2007   FIBROMYALGIA 10/09/2007      Review of Systems  Constitutional:  Negative for diaphoresis.  Respiratory:  Positive for shortness of breath.   Cardiovascular:  Positive for leg swelling. Negative for chest pain.  Gastrointestinal:  Positive for diarrhea.       Abdominal distension  All other systems reviewed and are negative.     Objective:     BP 110/70 (BP Location: Right Arm, Patient Position: Sitting, Cuff Size: Normal)   Pulse 87   Temp 98 F (36.7 C) (Oral)   Wt 163 lb 8 oz (74.2 kg)   SpO2 96%   BMI 28.96 kg/m    Physical Exam Vitals reviewed.  Constitutional:      Appearance: Normal appearance. She is normal weight.  Cardiovascular:     Rate and Rhythm: Normal rate and regular rhythm.     Pulses: Normal pulses.     Heart sounds: Murmur heard.  Pulmonary:     Effort: Pulmonary effort is normal.     Breath sounds: Rales (BL lower lobes on inspiration) present.  Abdominal:     General: Bowel sounds are normal. There is distension (with fluid wave).  Musculoskeletal:     Right lower leg: Edema present.     Left lower leg: Edema present.  Neurological:     General: No focal deficit present.     Mental Status: She is alert and oriented to person, place, and time. Mental status is at baseline.  Psychiatric:        Mood and Affect: Mood normal.        Behavior: Behavior normal.      No results found for any visits on 07/13/23.  Last CBC Lab Results  Component Value Date   WBC 6.1 07/02/2023   HGB 9.6 (L) 07/02/2023   HCT 30.1 (L) 07/02/2023   MCV 87.0 07/02/2023   MCH 27.7 07/02/2023   RDW 15.9 (H) 07/02/2023   PLT 98 (L) 07/02/2023   Last metabolic panel Lab Results  Component Value Date   GLUCOSE 143 (H) 07/05/2023   NA 133  (L) 07/05/2023   K 3.8 07/05/2023   CL 100 07/05/2023   CO2 21 (L) 07/05/2023   BUN 17 07/05/2023   CREATININE 0.84 07/05/2023   GFRNONAA >60 07/05/2023   CALCIUM 8.7 (L) 07/05/2023   PHOS 3.8 05/10/2022   PROT 6.3 (L) 07/05/2023   ALBUMIN 2.9 (L) 07/05/2023   LABGLOB 3.2 06/28/2022   AGRATIO 1.0 06/28/2022   BILITOT 3.1 (H) 07/05/2023   ALKPHOS 65 07/05/2023   AST 49 (H) 07/05/2023   ALT 43 07/05/2023   ANIONGAP 12 07/05/2023      The 10-year ASCVD risk score (Arnett DK, et al., 2019) is: 22.5%    Assessment & Plan:  Other cirrhosis of liver (HCC) Assessment & Plan: Secondary to NASH,  recent hospitalization for encephalopathy. Her abdomen is very distended  today, much more than previous and her weight is up 4 pounds from 8/5. I advised we increase her diuretics, 2 tablets BID of furosemide. I advised her husband to watch her blood pressure carefully and stop the diuretics if her blood pressure drops below 100 . When she returns to her normal weight she can resume her regular 20 mg BID dosing. She is scheduled for bloodwork on Monday already.   I messaged Dr. Barron Alvine and discussed the case with him. He advised getting abdominal US to look at the extent of the ascites. I have placed the order for this. She may need CT of the abd to look for intestinal edema. Will get the blood work and the [place] abd for now.   Orders: -     Spironolactone; Take 1-2 tablets (50-100 mg total) by mouth daily.  Dispense: 135 tablet; Refill: 1 -     Furosemide; Take 1-2 tablets (20-40 mg total) by mouth 2 (two) times daily. Hold if wt. Below 149 lbs.  Dispense: 135 tablet; Refill: 1 -     US Abdomen Complete; Future  Other ascites -     Spironolactone; Take 1-2 tablets (50-100 mg total) by mouth daily.  Dispense: 135 tablet; Refill: 1 -     Furosemide; Take 1-2 tablets (20-40 mg total) by mouth 2 (two) times daily. Hold if wt. Below 149 lbs.  Dispense: 135 tablet; Refill: 1 -     US Abdomen  Complete; Future   I spent 30 minutes with the patient today counseling on diuretic use, reviewing discharge summary, labs and GI consult notes  Return in about 3 months (around 10/13/2023) for follow up.    Karie Georges, MD

## 2023-07-15 ENCOUNTER — Other Ambulatory Visit: Payer: Self-pay | Admitting: Family Medicine

## 2023-07-15 ENCOUNTER — Encounter: Payer: Self-pay | Admitting: Gastroenterology

## 2023-07-15 DIAGNOSIS — F339 Major depressive disorder, recurrent, unspecified: Secondary | ICD-10-CM

## 2023-07-16 ENCOUNTER — Other Ambulatory Visit: Payer: Self-pay | Admitting: Cardiovascular Disease

## 2023-07-17 ENCOUNTER — Other Ambulatory Visit (INDEPENDENT_AMBULATORY_CARE_PROVIDER_SITE_OTHER): Payer: Medicare PPO

## 2023-07-17 DIAGNOSIS — K7581 Nonalcoholic steatohepatitis (NASH): Secondary | ICD-10-CM

## 2023-07-17 DIAGNOSIS — E1165 Type 2 diabetes mellitus with hyperglycemia: Secondary | ICD-10-CM | POA: Diagnosis not present

## 2023-07-17 DIAGNOSIS — K7682 Hepatic encephalopathy: Secondary | ICD-10-CM

## 2023-07-17 DIAGNOSIS — K7460 Unspecified cirrhosis of liver: Secondary | ICD-10-CM | POA: Diagnosis not present

## 2023-07-17 DIAGNOSIS — K641 Second degree hemorrhoids: Secondary | ICD-10-CM | POA: Diagnosis not present

## 2023-07-17 LAB — AMMONIA: Ammonia: 41 umol/L — ABNORMAL HIGH (ref 11–35)

## 2023-07-18 ENCOUNTER — Telehealth: Payer: Self-pay

## 2023-07-18 DIAGNOSIS — K7460 Unspecified cirrhosis of liver: Secondary | ICD-10-CM

## 2023-07-18 NOTE — Telephone Encounter (Signed)
Patient is scheduled for US Paracentesis at [facility] on 07/21/23 at 1:30 PM. Patient needs to be at the hospital 30 minutes prior. Patient made aware.

## 2023-07-18 NOTE — Patient Outreach (Signed)
  Care Coordination   Initial Visit Note   07/18/2023 Name: Dana Dyer MRN: [id number] DOB: 1951/02/11  Dana Dyer is a 72 y.o. year old female who sees Karie Georges, MD for primary care. I  spoke with spouse Dana Dyer by phone day.    What matters to the patients health and wellness today?  Get better    Goals Addressed             This Visit's Progress    Health Maintenace-DM and Hepatic Encephalopathy       Patient Goals/Self Care Activities: -Patient/Caregiver will self-administer medications as prescribed as evidenced by self-report/primary caregiver report  -Patient/Caregiver will attend all scheduled provider appointments as evidenced by clinician review of documented attendance to scheduled appointments and patient/caregiver report -Patient/Caregiver will call provider office for new concerns or questions as evidenced by review of documented incoming telephone call notes and patient report -Patient/Caregiver verbalizes understanding of plan  -Calls provider office for new concerns, questions, or BP outside discussed parameters -Checks BP and records as discussed -check blood sugar at prescribed times -check blood sugar if I feel it is too high or too low -record values and write them down take them to all doctor visits  -Weigh daily and record (notify MD with 3 lb weight gain over night or 5 lb in a week)   Spoke with spouse Dana Dyer. He states patient doing really well.  She is able to shower and dress herself.  Ammonia levels were normal after decrease in lactulose.  Diuretic dosing based on blood pressure.  Spouse states that blood pressure is holding well with last blood pressure 120/70.  Home health visited one time only. He states they were told patient did not need it after evaluation. Hemorrhoids flared with use of lactulose.  Discussed use of hemorrhoid creams and suppositories. No concerns at this time.  Agreeable to follow up outreach.          SDOH  assessments and interventions completed:  Yes     Care Coordination Interventions:  Yes, provided   Follow up plan: Follow up call scheduled for 8/27    Encounter Outcome:  Pt. Visit Completed

## 2023-07-18 NOTE — Telephone Encounter (Signed)
-----   Message from Shellia Cleverly sent at 07/18/2023 12:13 PM EDT ----- Please send referral to IR for ultrasound and paracentesis. To limit to 4L removed. Please send fluid for cell count, albumin, protein. ----- Message ----- From: Coletta Memos, CMA Sent: 07/18/2023  10:13 AM EDT To: Shellia Cleverly, DO  Provided Dr. Salena Saner recommendations, "Ok, in that case, no change to the lactulose.  Continue with that regimen. for the hemorrhoid, complete the Anusol suppositories as prescribed.  If not already doing, sitz bath's, barrier cream such as Desitin, witch hazel wipes overall reasonable to continue.  If continued symptoms, may need to refer her to Colorectal Surgery for eval and treatment." Patient verbalized understanding.  Patient stated since 8/6 after her office visit her abdomen started swelling up, and getting worse. Requesting for "draining fluid from stomach" Please advise. Thank you!

## 2023-07-18 NOTE — Telephone Encounter (Signed)
Received fax notification that patient is scheduled for a NP visit at [facility] on 10/02/23 at 1:30 PM. Office Ph# [phone] Fax# [phone]

## 2023-07-18 NOTE — Patient Instructions (Signed)
Visit Information  Thank you for taking time to visit with me today. Please don't hesitate to contact me if I can be of assistance to you.   Following are the goals we discussed today:   Goals Addressed             This Visit's Progress    Health Maintenace-DM and Hepatic Encephalopathy       Patient Goals/Self Care Activities: -Patient/Caregiver will self-administer medications as prescribed as evidenced by self-report/primary caregiver report  -Patient/Caregiver will attend all scheduled provider appointments as evidenced by clinician review of documented attendance to scheduled appointments and patient/caregiver report -Patient/Caregiver will call provider office for new concerns or questions as evidenced by review of documented incoming telephone call notes and patient report -Patient/Caregiver verbalizes understanding of plan  -Calls provider office for new concerns, questions, or BP outside discussed parameters -Checks BP and records as discussed -check blood sugar at prescribed times -check blood sugar if I feel it is too high or too low -record values and write them down take them to all doctor visits  -Weigh daily and record (notify MD with 3 lb weight gain over night or 5 lb in a week)   Spoke with spouse Rayna Sexton. He states patient doing really well.  She is able to shower and dress herself.  Ammonia levels were normal after decrease in lactulose.  Diuretic dosing based on blood pressure.  Spouse states that blood pressure is holding well with last blood pressure 120/70.  Home health visited one time only. He states they were told patient did not need it after evaluation. Hemorrhoids flared with use of lactulose.  Discussed use of hemorrhoid creams and suppositories. No concerns at this time.  Agreeable to follow up outreach.          Our next appointment is by telephone on 08/01/23 at 130 pm Please call the care guide team at [phone] if you need to cancel or reschedule  your appointment.   If you are experiencing a Mental Health or Behavioral Health Crisis or need someone to talk to, please call the Suicide and Crisis Lifeline: 988   Patient verbalizes understanding of instructions and care plan provided today and agrees to view in MyChart. Active MyChart status and patient understanding of how to access instructions and care plan via MyChart confirmed with patient.     The patient has been provided with contact information for the care management team and has been advised to call with any health related questions or concerns.   Bary Leriche, RN, MSN [facility] Care Management Care Management Coordinator Direct Line [phone]

## 2023-07-19 NOTE — Telephone Encounter (Signed)
Rx denial sent.

## 2023-07-19 NOTE — Telephone Encounter (Signed)
I stopped this and put her on mirtazapine. Please refuse rx

## 2023-07-20 ENCOUNTER — Encounter: Payer: Self-pay | Admitting: Gastroenterology

## 2023-07-20 ENCOUNTER — Encounter (INDEPENDENT_AMBULATORY_CARE_PROVIDER_SITE_OTHER): Payer: Self-pay

## 2023-07-21 ENCOUNTER — Telehealth: Payer: Self-pay | Admitting: Family Medicine

## 2023-07-21 ENCOUNTER — Ambulatory Visit (HOSPITAL_COMMUNITY)
Admission: RE | Admit: 2023-07-21 | Discharge: 2023-07-21 | Disposition: A | Discharge status: Home / Self Care | Payer: Medicare PPO | Source: Ambulatory Visit | Attending: Gastroenterology | Admitting: Gastroenterology

## 2023-07-21 DIAGNOSIS — K7460 Unspecified cirrhosis of liver: Secondary | ICD-10-CM | POA: Diagnosis not present

## 2023-07-21 DIAGNOSIS — K7581 Nonalcoholic steatohepatitis (NASH): Secondary | ICD-10-CM | POA: Diagnosis not present

## 2023-07-21 DIAGNOSIS — R188 Other ascites: Secondary | ICD-10-CM | POA: Diagnosis not present

## 2023-07-21 LAB — BODY FLUID CELL COUNT WITH DIFFERENTIAL
Lymphs, Fluid: 30 %
Monocyte-Macrophage-Serous Fluid: 64 % (ref 50–90)
Neutrophil Count, Fluid: 6 % (ref 0–25)
Total Nucleated Cell Count, Fluid: 80 cu mm (ref 0–1000)

## 2023-07-21 LAB — PROTEIN, PLEURAL OR PERITONEAL FLUID: Total protein, fluid: <3 g/dL

## 2023-07-21 LAB — ALBUMIN, PLEURAL OR PERITONEAL FLUID: Albumin, Fluid: <1.5 g/dL

## 2023-07-21 MED ORDER — LIDOCAINE HCL 1 % IJ SOLN
INTRAMUSCULAR | Status: AC
  Filled 2023-07-21: qty 20

## 2023-07-21 NOTE — Procedures (Signed)
PROCEDURE SUMMARY:  Successful ultrasound guided paracentesis from the right lower  quadrant.  Yielded 3.7 liters of yellow fluid.  No immediate complications.  The patient tolerated the procedure well.   Specimen  was sent for labs.  EBL none  If the patient eventually requires >/=2 paracenteses in a 30 day period, screening evaluation by the [facility] will be assessed.   Artemio Aly

## 2023-07-21 NOTE — Telephone Encounter (Signed)
Patient's husband called back and was informed of the message below.  He stated the patient went to [facility] today and he cancelled the appt on 8/19 as he is not going to take her for another [place].  Message sent to PCP.

## 2023-07-21 NOTE — Telephone Encounter (Signed)
Left a message for the patient to return my call.  

## 2023-07-21 NOTE — Telephone Encounter (Signed)
Pt's Spouse called to say [facility] contacted them for an Ultrasound, but they were under the impression they would be going elsewhere.  Spouse says he is very confused and would like a call back to discuss.

## 2023-07-24 ENCOUNTER — Encounter: Payer: Self-pay | Admitting: Gastroenterology

## 2023-07-24 ENCOUNTER — Other Ambulatory Visit: Payer: Medicare PPO

## 2023-07-24 LAB — PATHOLOGIST SMEAR REVIEW

## 2023-07-24 NOTE — Telephone Encounter (Signed)
Ok that's fine, it looks like it was a duplicate order. Ok to cancel the order

## 2023-07-26 DIAGNOSIS — Z01419 Encounter for gynecological examination (general) (routine) without abnormal findings: Secondary | ICD-10-CM | POA: Diagnosis not present

## 2023-07-26 DIAGNOSIS — D649 Anemia, unspecified: Secondary | ICD-10-CM | POA: Diagnosis not present

## 2023-07-26 DIAGNOSIS — R188 Other ascites: Secondary | ICD-10-CM | POA: Diagnosis not present

## 2023-07-26 DIAGNOSIS — B372 Candidiasis of skin and nail: Secondary | ICD-10-CM | POA: Diagnosis not present

## 2023-07-26 DIAGNOSIS — N819 Female genital prolapse, unspecified: Secondary | ICD-10-CM | POA: Diagnosis not present

## 2023-07-28 ENCOUNTER — Encounter: Payer: Self-pay | Admitting: Gastroenterology

## 2023-07-28 DIAGNOSIS — K7460 Unspecified cirrhosis of liver: Secondary | ICD-10-CM

## 2023-07-28 DIAGNOSIS — R188 Other ascites: Secondary | ICD-10-CM

## 2023-07-28 NOTE — Telephone Encounter (Signed)
Patient with MASH cirrhosis; hx  portal hypertensive gastropathy, esophageal varices and encephalopathy  I have spoken to patient who states that her abdomen is again very distended and tight. Had paracentesis 07/21/23 with 3.7 L drawn off.   Patient confirms that she is taking aldactone 50 mg daily, furosemide 20 mg twice daily, Xifaxan twice daily. She states she did further reduce her lactulose to 15 ml AM, 30 ml lunch, 15 ml PM due to severe diarrhea. This has improved since reducing the dose. Patient also says she is following low-sodium diet and trying to eat high protein.  She is not currently taking multivitamin as recommended and she has only been taking zinc twice daily. Advised to increase to TID dosing. She is taking levocarnitine TID and has [facility] appointment 10/02/23. She denies any increased confusion or lower extremity swelling.  Please advise.Marland KitchenMarland Kitchen

## 2023-07-28 NOTE — Telephone Encounter (Signed)
Spoke to patient's husband to advise that patient has been scheduled for IR paracentesis at [facility] on 08/01/23 at 10 am, 930 am arrival. Also advised that they should be receiving an additional call from interventional radiology regarding TIP's evaluation. Husband verbalizes understanding of this information.  Mr.Dondlinger states that patient has NOT seen Dr Marisue Humble (last appt 05/05/23. States they had an appointment but Ms.Brisky has had so many appointments she was just unable to make that one.

## 2023-08-01 ENCOUNTER — Ambulatory Visit (HOSPITAL_COMMUNITY): Admission: RE | Admit: 2023-08-01 | Payer: Medicare PPO | Source: Ambulatory Visit

## 2023-08-01 ENCOUNTER — Ambulatory Visit: Payer: Self-pay

## 2023-08-01 DIAGNOSIS — R188 Other ascites: Secondary | ICD-10-CM | POA: Insufficient documentation

## 2023-08-01 DIAGNOSIS — K7460 Unspecified cirrhosis of liver: Secondary | ICD-10-CM | POA: Diagnosis not present

## 2023-08-01 DIAGNOSIS — K7581 Nonalcoholic steatohepatitis (NASH): Secondary | ICD-10-CM | POA: Insufficient documentation

## 2023-08-01 HISTORY — PX: IR PARACENTESIS: IMG2679

## 2023-08-01 LAB — GRAM STAIN: Gram Stain: NONE SEEN

## 2023-08-01 LAB — BODY FLUID CELL COUNT WITH DIFFERENTIAL
Eos, Fluid: 0 %
Lymphs, Fluid: 52 %
Monocyte-Macrophage-Serous Fluid: 44 % — ABNORMAL LOW (ref 50–90)
Neutrophil Count, Fluid: 4 % (ref 0–25)
Total Nucleated Cell Count, Fluid: 83 cu mm (ref 0–1000)

## 2023-08-01 MED ORDER — LIDOCAINE HCL 1 % IJ SOLN
INTRAMUSCULAR | Status: AC
  Filled 2023-08-01: qty 20

## 2023-08-01 MED ORDER — LIDOCAINE HCL 1 % IJ SOLN
20.0000 mL | Freq: Once | INTRAMUSCULAR | Status: AC
  Administered 2023-08-01: 6 mL via INTRADERMAL

## 2023-08-01 NOTE — Patient Instructions (Signed)
Visit Information  Thank you for taking time to visit with me today. Please don't hesitate to contact me if I can be of assistance to you.   Following are the goals we discussed today:   Goals Addressed             This Visit's Progress    Health Maintenance-DM and Hepatic Encephalopathy       Patient Goals/Self Care Activities: -Patient/Caregiver will self-administer medications as prescribed as evidenced by self-report/primary caregiver report  -Patient/Caregiver will attend all scheduled provider appointments as evidenced by clinician review of documented attendance to scheduled appointments and patient/caregiver report -Patient/Caregiver will call provider office for new concerns or questions as evidenced by review of documented incoming telephone call notes and patient report -Patient/Caregiver verbalizes understanding of plan  -Calls provider office for new concerns, questions, or BP outside discussed parameters -Checks BP and records as discussed -check blood sugar at prescribed times -check blood sugar if I feel it is too high or too low -record values and write them down take them to all doctor visits  -Weigh daily and record (notify MD with 3 lb weight gain over night or 5 lb in a week)   Spoke with spouse Rayna Sexton. He states patient doing okay. Paracentesis done today due to fluid build up.  TIPS procedure being discussed to cut down on paracentesis.  Discussed advantage of TIPS procedure. Lactulose dosing continues.  No confusion or falls.  She continues to be independent with ADL's. No concerns at this time.           Our next appointment is by telephone on 08/22/23 at 130 pm  Please call the care guide team at [phone] if you need to cancel or reschedule your appointment.   If you are experiencing a Mental Health or Behavioral Health Crisis or need someone to talk to, please call the Suicide and Crisis Lifeline: 988   Patient verbalizes understanding of  instructions and care plan provided today and agrees to view in MyChart. Active MyChart status and patient understanding of how to access instructions and care plan via MyChart confirmed with patient.     The patient has been provided with contact information for the care management team and has been advised to call with any health related questions or concerns.   Bary Leriche, RN, MSN [facility], [facility] Management Community Coordinator Direct Dial: [phone]  Fax: [phone] Website: [email or web address]

## 2023-08-01 NOTE — Patient Outreach (Signed)
  Care Coordination   Follow Up Visit Note   08/01/2023 Name: Dana Dyer MRN: [id number] DOB: 01/18/1951  Dana Dyer is a 72 y.o. year old female who sees Karie Georges, MD for primary care. I  spoke with spouse Dana Dyer  What matters to the patients health and wellness today?  Abdominal distention    Goals Addressed             This Visit's Progress    Health Maintenance-DM and Hepatic Encephalopathy       Patient Goals/Self Care Activities: -Patient/Caregiver will self-administer medications as prescribed as evidenced by self-report/primary caregiver report  -Patient/Caregiver will attend all scheduled provider appointments as evidenced by clinician review of documented attendance to scheduled appointments and patient/caregiver report -Patient/Caregiver will call provider office for new concerns or questions as evidenced by review of documented incoming telephone call notes and patient report -Patient/Caregiver verbalizes understanding of plan  -Calls provider office for new concerns, questions, or BP outside discussed parameters -Checks BP and records as discussed -check blood sugar at prescribed times -check blood sugar if I feel it is too high or too low -record values and write them down take them to all doctor visits  -Weigh daily and record (notify MD with 3 lb weight gain over night or 5 lb in a week)   Spoke with spouse Dana Dyer. He states patient doing okay. Paracentesis done today due to fluid build up.  TIPS procedure being discussed to cut down on paracentesis.  Discussed advantage of TIPS procedure. Lactulose dosing continues.  No confusion or falls.  She continues to be independent with ADL's. No concerns at this time.           SDOH assessments and interventions completed:  Yes     Care Coordination Interventions:  Yes, provided   Follow up plan: Follow up call scheduled for September    Encounter Outcome:  Pt. Visit Completed   Bary Leriche,  RN, MSN [facility]  [facility], [facility] Management Community Coordinator Direct Dial: [phone]  Fax: [phone] Website: [email or web address]

## 2023-08-01 NOTE — Procedures (Signed)
PROCEDURE SUMMARY:  Successful US guided paracentesis from right lateral abdomen.  Yielded 2.6 liters of yellow fluid.  No immediate complications.  Pt tolerated well.   Specimen was sent for labs.  EBL < 5mL  Hoyt Koch PA-C 08/01/2023 11:18 AM

## 2023-08-02 ENCOUNTER — Other Ambulatory Visit: Payer: Self-pay | Admitting: *Deleted

## 2023-08-02 DIAGNOSIS — K7460 Unspecified cirrhosis of liver: Secondary | ICD-10-CM

## 2023-08-02 LAB — PATHOLOGIST SMEAR REVIEW: Path Review: NEGATIVE

## 2023-08-04 ENCOUNTER — Other Ambulatory Visit: Payer: Self-pay | Admitting: *Deleted

## 2023-08-04 ENCOUNTER — Encounter: Payer: Self-pay | Admitting: Gastroenterology

## 2023-08-04 MED ORDER — ONDANSETRON HCL 4 MG PO TABS: 4.0000 mg | ORAL_TABLET | Freq: Four times a day (QID) | ORAL | 1 refills | Status: DC | PRN

## 2023-08-04 NOTE — Progress Notes (Signed)
zofr 

## 2023-08-05 ENCOUNTER — Other Ambulatory Visit: Payer: Self-pay

## 2023-08-05 ENCOUNTER — Emergency Department (HOSPITAL_COMMUNITY): Payer: Medicare PPO

## 2023-08-05 ENCOUNTER — Inpatient Hospital Stay (HOSPITAL_COMMUNITY)
Admission: EM | Admit: 2023-08-05 | Discharge: 2023-09-05 | DRG: 441 | Disposition: E | Payer: Medicare PPO | Attending: Internal Medicine | Admitting: Internal Medicine

## 2023-08-05 DIAGNOSIS — R1310 Dysphagia, unspecified: Secondary | ICD-10-CM | POA: Diagnosis not present

## 2023-08-05 DIAGNOSIS — E876 Hypokalemia: Secondary | ICD-10-CM | POA: Diagnosis not present

## 2023-08-05 DIAGNOSIS — Z90710 Acquired absence of both cervix and uterus: Secondary | ICD-10-CM

## 2023-08-05 DIAGNOSIS — K862 Cyst of pancreas: Secondary | ICD-10-CM | POA: Diagnosis present

## 2023-08-05 DIAGNOSIS — Z79899 Other long term (current) drug therapy: Secondary | ICD-10-CM

## 2023-08-05 DIAGNOSIS — E785 Hyperlipidemia, unspecified: Secondary | ICD-10-CM | POA: Diagnosis present

## 2023-08-05 DIAGNOSIS — Z794 Long term (current) use of insulin: Secondary | ICD-10-CM

## 2023-08-05 DIAGNOSIS — G9341 Metabolic encephalopathy: Secondary | ICD-10-CM | POA: Diagnosis present

## 2023-08-05 DIAGNOSIS — Z7189 Other specified counseling: Secondary | ICD-10-CM | POA: Diagnosis not present

## 2023-08-05 DIAGNOSIS — I8510 Secondary esophageal varices without bleeding: Secondary | ICD-10-CM | POA: Diagnosis present

## 2023-08-05 DIAGNOSIS — K7460 Unspecified cirrhosis of liver: Secondary | ICD-10-CM | POA: Diagnosis present

## 2023-08-05 DIAGNOSIS — K219 Gastro-esophageal reflux disease without esophagitis: Secondary | ICD-10-CM | POA: Diagnosis present

## 2023-08-05 DIAGNOSIS — E119 Type 2 diabetes mellitus without complications: Secondary | ICD-10-CM

## 2023-08-05 DIAGNOSIS — J690 Pneumonitis due to inhalation of food and vomit: Secondary | ICD-10-CM | POA: Diagnosis not present

## 2023-08-05 DIAGNOSIS — R680 Hypothermia, not associated with low environmental temperature: Secondary | ICD-10-CM | POA: Diagnosis present

## 2023-08-05 DIAGNOSIS — N179 Acute kidney failure, unspecified: Secondary | ICD-10-CM | POA: Diagnosis present

## 2023-08-05 DIAGNOSIS — D649 Anemia, unspecified: Secondary | ICD-10-CM | POA: Diagnosis not present

## 2023-08-05 DIAGNOSIS — E8809 Other disorders of plasma-protein metabolism, not elsewhere classified: Secondary | ICD-10-CM | POA: Diagnosis present

## 2023-08-05 DIAGNOSIS — Z8249 Family history of ischemic heart disease and other diseases of the circulatory system: Secondary | ICD-10-CM

## 2023-08-05 DIAGNOSIS — I959 Hypotension, unspecified: Secondary | ICD-10-CM | POA: Diagnosis not present

## 2023-08-05 DIAGNOSIS — Z9089 Acquired absence of other organs: Secondary | ICD-10-CM

## 2023-08-05 DIAGNOSIS — J209 Acute bronchitis, unspecified: Secondary | ICD-10-CM | POA: Diagnosis not present

## 2023-08-05 DIAGNOSIS — R159 Full incontinence of feces: Secondary | ICD-10-CM | POA: Diagnosis present

## 2023-08-05 DIAGNOSIS — K7682 Hepatic encephalopathy: Secondary | ICD-10-CM | POA: Diagnosis present

## 2023-08-05 DIAGNOSIS — Z8261 Family history of arthritis: Secondary | ICD-10-CM

## 2023-08-05 DIAGNOSIS — Z9889 Other specified postprocedural states: Secondary | ICD-10-CM

## 2023-08-05 DIAGNOSIS — K7581 Nonalcoholic steatohepatitis (NASH): Secondary | ICD-10-CM | POA: Diagnosis present

## 2023-08-05 DIAGNOSIS — I10 Essential (primary) hypertension: Secondary | ICD-10-CM | POA: Diagnosis not present

## 2023-08-05 DIAGNOSIS — R630 Anorexia: Secondary | ICD-10-CM | POA: Diagnosis present

## 2023-08-05 DIAGNOSIS — R9389 Abnormal findings on diagnostic imaging of other specified body structures: Secondary | ICD-10-CM | POA: Diagnosis not present

## 2023-08-05 DIAGNOSIS — E46 Unspecified protein-calorie malnutrition: Secondary | ICD-10-CM | POA: Diagnosis present

## 2023-08-05 DIAGNOSIS — Z91040 Latex allergy status: Secondary | ICD-10-CM

## 2023-08-05 DIAGNOSIS — K766 Portal hypertension: Secondary | ICD-10-CM | POA: Diagnosis present

## 2023-08-05 DIAGNOSIS — D696 Thrombocytopenia, unspecified: Secondary | ICD-10-CM | POA: Diagnosis present

## 2023-08-05 DIAGNOSIS — J984 Other disorders of lung: Secondary | ICD-10-CM | POA: Diagnosis not present

## 2023-08-05 DIAGNOSIS — T383X1A Poisoning by insulin and oral hypoglycemic [antidiabetic] drugs, accidental (unintentional), initial encounter: Secondary | ICD-10-CM | POA: Diagnosis present

## 2023-08-05 DIAGNOSIS — K7689 Other specified diseases of liver: Secondary | ICD-10-CM | POA: Diagnosis not present

## 2023-08-05 DIAGNOSIS — J189 Pneumonia, unspecified organism: Secondary | ICD-10-CM | POA: Insufficient documentation

## 2023-08-05 DIAGNOSIS — J9601 Acute respiratory failure with hypoxia: Secondary | ICD-10-CM | POA: Diagnosis not present

## 2023-08-05 DIAGNOSIS — E871 Hypo-osmolality and hyponatremia: Secondary | ICD-10-CM | POA: Diagnosis present

## 2023-08-05 DIAGNOSIS — Z811 Family history of alcohol abuse and dependence: Secondary | ICD-10-CM

## 2023-08-05 DIAGNOSIS — Z515 Encounter for palliative care: Secondary | ICD-10-CM | POA: Diagnosis not present

## 2023-08-05 DIAGNOSIS — K429 Umbilical hernia without obstruction or gangrene: Secondary | ICD-10-CM | POA: Diagnosis present

## 2023-08-05 DIAGNOSIS — R410 Disorientation, unspecified: Secondary | ICD-10-CM | POA: Diagnosis not present

## 2023-08-05 DIAGNOSIS — R791 Abnormal coagulation profile: Secondary | ICD-10-CM | POA: Diagnosis present

## 2023-08-05 DIAGNOSIS — R7401 Elevation of levels of liver transaminase levels: Secondary | ICD-10-CM | POA: Diagnosis present

## 2023-08-05 DIAGNOSIS — E872 Acidosis, unspecified: Secondary | ICD-10-CM | POA: Diagnosis present

## 2023-08-05 DIAGNOSIS — R188 Other ascites: Secondary | ICD-10-CM | POA: Diagnosis present

## 2023-08-05 DIAGNOSIS — R4182 Altered mental status, unspecified: Principal | ICD-10-CM

## 2023-08-05 DIAGNOSIS — D638 Anemia in other chronic diseases classified elsewhere: Secondary | ICD-10-CM | POA: Diagnosis present

## 2023-08-05 DIAGNOSIS — F322 Major depressive disorder, single episode, severe without psychotic features: Secondary | ICD-10-CM | POA: Diagnosis present

## 2023-08-05 DIAGNOSIS — J90 Pleural effusion, not elsewhere classified: Secondary | ICD-10-CM | POA: Diagnosis not present

## 2023-08-05 DIAGNOSIS — I129 Hypertensive chronic kidney disease with stage 1 through stage 4 chronic kidney disease, or unspecified chronic kidney disease: Secondary | ICD-10-CM | POA: Diagnosis present

## 2023-08-05 DIAGNOSIS — Z885 Allergy status to narcotic agent status: Secondary | ICD-10-CM

## 2023-08-05 DIAGNOSIS — Z7984 Long term (current) use of oral hypoglycemic drugs: Secondary | ICD-10-CM

## 2023-08-05 DIAGNOSIS — R000 Tachycardia, unspecified: Secondary | ICD-10-CM | POA: Diagnosis not present

## 2023-08-05 DIAGNOSIS — R161 Splenomegaly, not elsewhere classified: Secondary | ICD-10-CM | POA: Diagnosis present

## 2023-08-05 DIAGNOSIS — Z6828 Body mass index (BMI) 28.0-28.9, adult: Secondary | ICD-10-CM

## 2023-08-05 DIAGNOSIS — E11649 Type 2 diabetes mellitus with hypoglycemia without coma: Secondary | ICD-10-CM | POA: Diagnosis present

## 2023-08-05 DIAGNOSIS — K7290 Hepatic failure, unspecified without coma: Secondary | ICD-10-CM | POA: Diagnosis present

## 2023-08-05 DIAGNOSIS — Z803 Family history of malignant neoplasm of breast: Secondary | ICD-10-CM

## 2023-08-05 DIAGNOSIS — Z881 Allergy status to other antibiotic agents status: Secondary | ICD-10-CM

## 2023-08-05 DIAGNOSIS — M797 Fibromyalgia: Secondary | ICD-10-CM | POA: Diagnosis present

## 2023-08-05 DIAGNOSIS — N182 Chronic kidney disease, stage 2 (mild): Secondary | ICD-10-CM | POA: Diagnosis present

## 2023-08-05 DIAGNOSIS — Z85828 Personal history of other malignant neoplasm of skin: Secondary | ICD-10-CM

## 2023-08-05 DIAGNOSIS — K767 Hepatorenal syndrome: Secondary | ICD-10-CM | POA: Diagnosis present

## 2023-08-05 DIAGNOSIS — R7989 Other specified abnormal findings of blood chemistry: Secondary | ICD-10-CM | POA: Diagnosis present

## 2023-08-05 DIAGNOSIS — M479 Spondylosis, unspecified: Secondary | ICD-10-CM | POA: Diagnosis present

## 2023-08-05 DIAGNOSIS — G4733 Obstructive sleep apnea (adult) (pediatric): Secondary | ICD-10-CM | POA: Diagnosis present

## 2023-08-05 DIAGNOSIS — Z833 Family history of diabetes mellitus: Secondary | ICD-10-CM

## 2023-08-05 DIAGNOSIS — E860 Dehydration: Secondary | ICD-10-CM | POA: Diagnosis present

## 2023-08-05 DIAGNOSIS — R32 Unspecified urinary incontinence: Secondary | ICD-10-CM | POA: Diagnosis present

## 2023-08-05 DIAGNOSIS — R011 Cardiac murmur, unspecified: Secondary | ICD-10-CM | POA: Diagnosis present

## 2023-08-05 DIAGNOSIS — M160 Bilateral primary osteoarthritis of hip: Secondary | ICD-10-CM | POA: Diagnosis present

## 2023-08-05 DIAGNOSIS — K3189 Other diseases of stomach and duodenum: Secondary | ICD-10-CM | POA: Diagnosis present

## 2023-08-05 DIAGNOSIS — D6959 Other secondary thrombocytopenia: Secondary | ICD-10-CM | POA: Diagnosis present

## 2023-08-05 DIAGNOSIS — Z66 Do not resuscitate: Secondary | ICD-10-CM | POA: Diagnosis not present

## 2023-08-05 DIAGNOSIS — F411 Generalized anxiety disorder: Secondary | ICD-10-CM | POA: Diagnosis present

## 2023-08-05 DIAGNOSIS — Z888 Allergy status to other drugs, medicaments and biological substances status: Secondary | ICD-10-CM

## 2023-08-05 DIAGNOSIS — Z9851 Tubal ligation status: Secondary | ICD-10-CM

## 2023-08-05 LAB — COMPREHENSIVE METABOLIC PANEL
ALT: 77 U/L — ABNORMAL HIGH (ref 0–44)
AST: 65 U/L — ABNORMAL HIGH (ref 15–41)
Albumin: 2.6 g/dL — ABNORMAL LOW (ref 3.5–5.0)
Alkaline Phosphatase: 120 U/L (ref 38–126)
Anion gap: 16 — ABNORMAL HIGH (ref 5–15)
BUN: 49 mg/dL — ABNORMAL HIGH (ref 8–23)
CO2: 16 mmol/L — ABNORMAL LOW (ref 22–32)
Calcium: 8.7 mg/dL — ABNORMAL LOW (ref 8.9–10.3)
Chloride: 100 mmol/L (ref 98–111)
Creatinine, Ser: 2.26 mg/dL — ABNORMAL HIGH (ref 0.44–1.00)
GFR, Estimated: 22 mL/min — ABNORMAL LOW (ref >60)
Glucose, Bld: 83 mg/dL (ref 70–99)
Potassium: 3.6 mmol/L (ref 3.5–5.1)
Sodium: 132 mmol/L — ABNORMAL LOW (ref 135–145)
Total Bilirubin: 2.1 mg/dL — ABNORMAL HIGH (ref 0.3–1.2)
Total Protein: 6 g/dL — ABNORMAL LOW (ref 6.5–8.1)

## 2023-08-05 LAB — CBC WITH DIFFERENTIAL/PLATELET
Abs Immature Granulocytes: 0.04 10*3/uL (ref 0.00–0.07)
Basophils Absolute: 0.1 10*3/uL (ref 0.0–0.1)
Basophils Relative: 1 %
Eosinophils Absolute: 0.3 10*3/uL (ref 0.0–0.5)
Eosinophils Relative: 3 %
HCT: 30.4 % — ABNORMAL LOW (ref 36.0–46.0)
Hemoglobin: 9.6 g/dL — ABNORMAL LOW (ref 12.0–15.0)
Immature Granulocytes: 0 %
Lymphocytes Relative: 9 %
Lymphs Abs: 0.9 10*3/uL (ref 0.7–4.0)
MCH: 26.3 pg (ref 26.0–34.0)
MCHC: 31.6 g/dL (ref 30.0–36.0)
MCV: 83.3 fL (ref 80.0–100.0)
Monocytes Absolute: 1.3 10*3/uL — ABNORMAL HIGH (ref 0.1–1.0)
Monocytes Relative: 12 %
Neutro Abs: 7.7 10*3/uL (ref 1.7–7.7)
Neutrophils Relative %: 75 %
Platelets: 110 10*3/uL — ABNORMAL LOW (ref 150–400)
RBC: 3.65 MIL/uL — ABNORMAL LOW (ref 3.87–5.11)
RDW: 17.1 % — ABNORMAL HIGH (ref 11.5–15.5)
WBC: 10.3 10*3/uL (ref 4.0–10.5)
nRBC: 0 % (ref 0.0–0.2)

## 2023-08-05 LAB — I-STAT CG4 LACTIC ACID, ED: Lactic Acid, Venous: 1.7 mmol/L (ref 0.5–1.9)

## 2023-08-05 LAB — AMMONIA: Ammonia: 49 umol/L — ABNORMAL HIGH (ref 9–35)

## 2023-08-05 NOTE — ED Provider Notes (Signed)
[facility] EMERGENCY DEPARTMENT AT [facility] Provider Note   CSN: [id number] Arrival date & time: 07/14/2023  2149     History {Add pertinent medical, surgical, social history, OB history to HPI:1} Chief Complaint  Patient presents with   Altered Mental Status    CLAUDETTE FUSELIER is a 72 y.o. female.  The history is provided by the patient and medical records.  Altered Mental Status  72 year old female with history of hypertension, diabetes, fibromyalgia, GERD, NASH cirrhosis, presenting to the ED with altered mental status.  Patient has been on lactulose regimen but with worsening mental status per family.  She has been incontinent of urine and stool today.  Patient is not able to provide any history at this time.  Home Medications Prior to Admission medications   Medication Sig Start Date End Date Taking? Authorizing Provider  clobetasol cream (TEMOVATE) 0.05 % Apply 1 Application topically 2 (two) times daily as needed (yeast infection).    [provider]  Continuous Blood Gluc Sensor (DEXCOM G7 SENSOR) MISC 1 Device by Does not apply route as directed. Every 10 days 05/09/22   Shamleffer, Konrad Dolores, MD  furosemide (LASIX) 20 MG tablet Take 1-2 tablets (20-40 mg total) by mouth 2 (two) times daily. Hold if wt. Below 149 lbs. 07/13/23   Karie Georges, MD  hydrocortisone (ANUSOL-HC) 25 MG suppository Place 1 suppository (25 mg total) rectally at bedtime. 07/11/23   Cirigliano, Vito V, DO  insulin glargine (LANTUS SOLOSTAR) 100 UNIT/ML Solostar Pen Inject 26 Units into the skin daily. 06/15/23   Shamleffer, Konrad Dolores, MD  insulin lispro (HUMALOG KWIKPEN) 200 UNIT/ML KwikPen Max daily 60 units 06/15/23   Shamleffer, Konrad Dolores, MD  Insulin Pen Needle (BD PEN NEEDLE NANO 2ND GEN) 32G X 4 MM MISC 1 Device by Other route in the morning, at noon, in the evening, and at bedtime. 06/15/23   Shamleffer, Konrad Dolores, MD  lactulose (CHRONULAC) 10 GM/15ML  solution 3 tablespoons for breakfast, 2 tablespoons with lunch and dinner 07/05/23   [place], Eric J, DO  metFORMIN (GLUCOPHAGE) 1000 MG tablet Take 1,000 mg by mouth 2 (two) times daily with a meal. Patient not taking: Reported on 07/13/2023    [provider]  metFORMIN (GLUCOPHAGE-XR) 750 MG 24 hr tablet Take 1 tablet (750 mg total) by mouth 2 (two) times daily. 06/15/23   Shamleffer, Konrad Dolores, MD  metoprolol tartrate (LOPRESSOR) 25 MG tablet Take 1 tablet (25 mg total) by mouth 2 (two) times daily. 07/17/23   Nahser, Deloris Ping, MD  mirtazapine (REMERON SOL-TAB) 15 MG disintegrating tablet Start with 1/2 tablet daily at bedtime for 1 week, then increase to 1 tablet daily at bedtime. 06/27/23   Karie Georges, MD  omeprazole (PRILOSEC) 20 MG capsule Take 20 mg by mouth daily as needed (heartburn).    [provider]  ondansetron (ZOFRAN) 4 MG tablet Take 1 tablet (4 mg total) by mouth every 6 (six) hours as needed for nausea or vomiting. 08/04/23   Iva Boop, MD  rifaximin (XIFAXAN) 550 MG TABS tablet Take 1 tablet (550 mg total) by mouth 2 (two) times daily. 01/18/23   Cirigliano, Vito V, DO  spironolactone (ALDACTONE) 50 MG tablet Take 1-2 tablets (50-100 mg total) by mouth daily. 07/13/23   Karie Georges, MD  vitamin k 100 MCG tablet Take 100 mcg by mouth daily.    [provider]  zinc gluconate 50 MG tablet Take 1 tablet (50  mg total) by mouth daily. 10/20/22   Doree Albee, PA-C      Allergies    Amoxicillin-pot clavulanate, Codeine sulfate, Diflucan [fluconazole], Epinephrine, Erythromycin base, Invokana [canagliflozin], Irbesartan-hydrochlorothiazide, Victoza [liraglutide], Latex, and Ozempic (0.25 or 0.5 mg-dose) [semaglutide(0.25 or 0.5mg -dos)]    Review of Systems   Review of Systems  Unable to perform ROS: Mental status change    Physical Exam Updated Vital Signs BP 119/70 (BP Location: Right Arm)   Pulse 100   Temp 98.5 F (36.9 C)  (Oral)   Resp 18   Wt 73.9 kg   SpO2 100%   BMI 28.87 kg/m   Physical Exam Vitals and nursing note reviewed.  Constitutional:      Appearance: She is well-developed.  HENT:     Head: Normocephalic and atraumatic.  Eyes:     Conjunctiva/sclera: Conjunctivae normal.     Pupils: Pupils are equal, round, and reactive to light.  Cardiovascular:     Rate and Rhythm: Normal rate and regular rhythm.     Heart sounds: Normal heart sounds.  Pulmonary:     Effort: Pulmonary effort is normal.     Breath sounds: Normal breath sounds.  Abdominal:     General: Bowel sounds are normal.     Palpations: Abdomen is soft.  Musculoskeletal:        General: Normal range of motion.     Cervical back: Normal range of motion.     Comments: Small skin tear right forearm, no bleeding, no swelling or acute bony deformity noted, radial pulse intact  Skin:    General: Skin is warm and dry.  Neurological:     Comments: Awake, constantly fidgeting in stretcher but not really able to follow commands, not answering questions, spontaneously moving extremities well without focal deficit appreciated     ED Results / Procedures / Treatments   Labs (all labs ordered are listed, but only abnormal results are displayed) Labs Reviewed  CBC WITH DIFFERENTIAL/PLATELET - Abnormal; Notable for the following components:      Result Value   RBC 3.65 (*)    Hemoglobin 9.6 (*)    HCT 30.4 (*)    RDW 17.1 (*)    Platelets 110 (*)    Monocytes Absolute 1.3 (*)    All other components within normal limits  COMPREHENSIVE METABOLIC PANEL  AMMONIA  I-STAT CG4 LACTIC ACID, ED  CBG MONITORING, ED    EKG None  Radiology No results found.  Procedures Procedures  {Document cardiac monitor, telemetry assessment procedure when appropriate:1}  Medications Ordered in ED Medications - No data to display  ED Course/ Medical Decision Making/ A&P   {   Click here for ABCD2, HEART and other calculatorsREFRESH Note  before signing :1}                              Medical Decision Making Amount and/or Complexity of Data Reviewed Labs: ordered. Radiology: ordered.   ***  {Document critical care time when appropriate:1} {Document review of labs and clinical decision tools ie heart score, Chads2Vasc2 etc:1}  {Document your independent review of radiology images, and any outside records:1} {Document your discussion with family members, caretakers, and with consultants:1} {Document social determinants of health affecting pt's care:1} {Document your decision making why or why not admission, treatments were needed:1} Final Clinical Impression(s) / ED Diagnoses Final diagnoses:  None    Rx / DC Orders ED Discharge Orders  None

## 2023-08-05 NOTE — ED Triage Notes (Signed)
Pt BIB EMS with c/o AMS that has gotten worse per family. Pt has end-stage liver failure. Per family, pt has been incontinent of urine and stool today. Pt is a&ox1.

## 2023-08-06 ENCOUNTER — Inpatient Hospital Stay (HOSPITAL_COMMUNITY): Payer: Medicare PPO

## 2023-08-06 ENCOUNTER — Encounter (HOSPITAL_COMMUNITY): Payer: Self-pay | Admitting: Internal Medicine

## 2023-08-06 DIAGNOSIS — I8510 Secondary esophageal varices without bleeding: Secondary | ICD-10-CM | POA: Diagnosis present

## 2023-08-06 DIAGNOSIS — E871 Hypo-osmolality and hyponatremia: Secondary | ICD-10-CM | POA: Diagnosis present

## 2023-08-06 DIAGNOSIS — Z515 Encounter for palliative care: Secondary | ICD-10-CM | POA: Diagnosis not present

## 2023-08-06 DIAGNOSIS — K7682 Hepatic encephalopathy: Secondary | ICD-10-CM | POA: Diagnosis present

## 2023-08-06 DIAGNOSIS — Z66 Do not resuscitate: Secondary | ICD-10-CM | POA: Diagnosis not present

## 2023-08-06 DIAGNOSIS — K766 Portal hypertension: Secondary | ICD-10-CM | POA: Diagnosis present

## 2023-08-06 DIAGNOSIS — E872 Acidosis, unspecified: Secondary | ICD-10-CM | POA: Insufficient documentation

## 2023-08-06 DIAGNOSIS — K7290 Hepatic failure, unspecified without coma: Secondary | ICD-10-CM | POA: Diagnosis present

## 2023-08-06 DIAGNOSIS — D638 Anemia in other chronic diseases classified elsewhere: Secondary | ICD-10-CM | POA: Diagnosis present

## 2023-08-06 DIAGNOSIS — E119 Type 2 diabetes mellitus without complications: Secondary | ICD-10-CM

## 2023-08-06 DIAGNOSIS — E11649 Type 2 diabetes mellitus with hypoglycemia without coma: Secondary | ICD-10-CM | POA: Diagnosis present

## 2023-08-06 DIAGNOSIS — J690 Pneumonitis due to inhalation of food and vomit: Secondary | ICD-10-CM | POA: Diagnosis not present

## 2023-08-06 DIAGNOSIS — R188 Other ascites: Secondary | ICD-10-CM | POA: Diagnosis present

## 2023-08-06 DIAGNOSIS — R1310 Dysphagia, unspecified: Secondary | ICD-10-CM

## 2023-08-06 DIAGNOSIS — K767 Hepatorenal syndrome: Secondary | ICD-10-CM | POA: Diagnosis present

## 2023-08-06 DIAGNOSIS — I129 Hypertensive chronic kidney disease with stage 1 through stage 4 chronic kidney disease, or unspecified chronic kidney disease: Secondary | ICD-10-CM | POA: Diagnosis present

## 2023-08-06 DIAGNOSIS — G9341 Metabolic encephalopathy: Secondary | ICD-10-CM | POA: Diagnosis present

## 2023-08-06 DIAGNOSIS — F411 Generalized anxiety disorder: Secondary | ICD-10-CM | POA: Insufficient documentation

## 2023-08-06 DIAGNOSIS — E860 Dehydration: Secondary | ICD-10-CM

## 2023-08-06 DIAGNOSIS — F322 Major depressive disorder, single episode, severe without psychotic features: Secondary | ICD-10-CM | POA: Diagnosis present

## 2023-08-06 DIAGNOSIS — K7581 Nonalcoholic steatohepatitis (NASH): Secondary | ICD-10-CM | POA: Diagnosis present

## 2023-08-06 DIAGNOSIS — K219 Gastro-esophageal reflux disease without esophagitis: Secondary | ICD-10-CM

## 2023-08-06 DIAGNOSIS — Z7189 Other specified counseling: Secondary | ICD-10-CM | POA: Diagnosis not present

## 2023-08-06 DIAGNOSIS — Z794 Long term (current) use of insulin: Secondary | ICD-10-CM

## 2023-08-06 DIAGNOSIS — D696 Thrombocytopenia, unspecified: Secondary | ICD-10-CM

## 2023-08-06 DIAGNOSIS — D6959 Other secondary thrombocytopenia: Secondary | ICD-10-CM | POA: Diagnosis present

## 2023-08-06 DIAGNOSIS — N179 Acute kidney failure, unspecified: Secondary | ICD-10-CM | POA: Diagnosis present

## 2023-08-06 DIAGNOSIS — K862 Cyst of pancreas: Secondary | ICD-10-CM | POA: Diagnosis present

## 2023-08-06 DIAGNOSIS — I10 Essential (primary) hypertension: Secondary | ICD-10-CM

## 2023-08-06 DIAGNOSIS — D649 Anemia, unspecified: Secondary | ICD-10-CM

## 2023-08-06 DIAGNOSIS — J9601 Acute respiratory failure with hypoxia: Secondary | ICD-10-CM | POA: Diagnosis not present

## 2023-08-06 DIAGNOSIS — K7460 Unspecified cirrhosis of liver: Secondary | ICD-10-CM | POA: Diagnosis present

## 2023-08-06 DIAGNOSIS — E46 Unspecified protein-calorie malnutrition: Secondary | ICD-10-CM | POA: Diagnosis present

## 2023-08-06 LAB — COMPREHENSIVE METABOLIC PANEL
ALT: 63 U/L — ABNORMAL HIGH (ref 0–44)
AST: 53 U/L — ABNORMAL HIGH (ref 15–41)
Albumin: 2.2 g/dL — ABNORMAL LOW (ref 3.5–5.0)
Alkaline Phosphatase: 101 U/L (ref 38–126)
Anion gap: 16 — ABNORMAL HIGH (ref 5–15)
BUN: 49 mg/dL — ABNORMAL HIGH (ref 8–23)
CO2: 16 mmol/L — ABNORMAL LOW (ref 22–32)
Calcium: 8 mg/dL — ABNORMAL LOW (ref 8.9–10.3)
Chloride: 103 mmol/L (ref 98–111)
Creatinine, Ser: 2.06 mg/dL — ABNORMAL HIGH (ref 0.44–1.00)
GFR, Estimated: 25 mL/min — ABNORMAL LOW (ref >60)
Glucose, Bld: 161 mg/dL — ABNORMAL HIGH (ref 70–99)
Potassium: 3.1 mmol/L — ABNORMAL LOW (ref 3.5–5.1)
Sodium: 135 mmol/L (ref 135–145)
Total Bilirubin: 2.2 mg/dL — ABNORMAL HIGH (ref 0.3–1.2)
Total Protein: 5.2 g/dL — ABNORMAL LOW (ref 6.5–8.1)

## 2023-08-06 LAB — MAGNESIUM: Magnesium: 1.7 mg/dL (ref 1.7–2.4)

## 2023-08-06 LAB — GLUCOSE, RANDOM: Glucose, Bld: 62 mg/dL — ABNORMAL LOW (ref 70–99)

## 2023-08-06 LAB — CBC
HCT: 26.6 % — ABNORMAL LOW (ref 36.0–46.0)
Hemoglobin: 8.5 g/dL — ABNORMAL LOW (ref 12.0–15.0)
MCH: 26.4 pg (ref 26.0–34.0)
MCHC: 32 g/dL (ref 30.0–36.0)
MCV: 82.6 fL (ref 80.0–100.0)
Platelets: 84 10*3/uL — ABNORMAL LOW (ref 150–400)
RBC: 3.22 MIL/uL — ABNORMAL LOW (ref 3.87–5.11)
RDW: 17.2 % — ABNORMAL HIGH (ref 11.5–15.5)
WBC: 6.7 10*3/uL (ref 4.0–10.5)
nRBC: 0 % (ref 0.0–0.2)

## 2023-08-06 LAB — CBG MONITORING, ED
Glucose-Capillary: 103 mg/dL — ABNORMAL HIGH (ref 70–99)
Glucose-Capillary: 35 mg/dL — CL (ref 70–99)
Glucose-Capillary: 116 mg/dL — ABNORMAL HIGH (ref 70–99)
Glucose-Capillary: 81 mg/dL (ref 70–99)

## 2023-08-06 LAB — AMMONIA: Ammonia: 69 umol/L — ABNORMAL HIGH (ref 9–35)

## 2023-08-06 LAB — TSH: TSH: 1.321 u[IU]/mL (ref 0.350–4.500)

## 2023-08-06 LAB — CORTISOL: Cortisol, Plasma: 23.5 ug/dL

## 2023-08-06 LAB — BRAIN NATRIURETIC PEPTIDE: B Natriuretic Peptide: 70.9 pg/mL (ref 0.0–100.0)

## 2023-08-06 LAB — GLUCOSE, CAPILLARY
Glucose-Capillary: 57 mg/dL — ABNORMAL LOW (ref 70–99)
Glucose-Capillary: 103 mg/dL — ABNORMAL HIGH (ref 70–99)
Glucose-Capillary: 139 mg/dL — ABNORMAL HIGH (ref 70–99)
Glucose-Capillary: 117 mg/dL — ABNORMAL HIGH (ref 70–99)
Glucose-Capillary: 205 mg/dL — ABNORMAL HIGH (ref 70–99)

## 2023-08-06 LAB — CULTURE, BODY FLUID W GRAM STAIN -BOTTLE: Culture: NO GROWTH

## 2023-08-06 LAB — URINALYSIS, W/ REFLEX TO CULTURE (INFECTION SUSPECTED)
Bacteria, UA: NONE SEEN
Bilirubin Urine: NEGATIVE
Glucose, UA: NEGATIVE mg/dL
Hgb urine dipstick: NEGATIVE
Ketones, ur: NEGATIVE mg/dL
Leukocytes,Ua: NEGATIVE
Nitrite: NEGATIVE
Protein, ur: NEGATIVE mg/dL
Specific Gravity, Urine: 1.011 (ref 1.005–1.030)
pH: 5 (ref 5.0–8.0)

## 2023-08-06 LAB — PROCALCITONIN: Procalcitonin: 0.15 ng/mL

## 2023-08-06 LAB — PROTIME-INR
INR: 1.2 (ref 0.8–1.2)
Prothrombin Time: 15.7 s — ABNORMAL HIGH (ref 11.4–15.2)

## 2023-08-06 LAB — C-REACTIVE PROTEIN: CRP: 2 mg/dL — ABNORMAL HIGH (ref <1.0)

## 2023-08-06 LAB — HEMOGLOBIN A1C
Hgb A1c MFr Bld: 6.3 % — ABNORMAL HIGH (ref 4.8–5.6)
Mean Plasma Glucose: 134.11 mg/dL

## 2023-08-06 MED ORDER — LACTULOSE 10 GM/15ML PO SOLN: 10.0000 g | Freq: Two times a day (BID) | ORAL | Status: DC

## 2023-08-06 MED ORDER — POTASSIUM CHLORIDE 10 MEQ/100ML IV SOLN
10.0000 meq | INTRAVENOUS | Status: DC
  Administered 2023-08-06 (×4): 10 meq via INTRAVENOUS
  Filled 2023-08-06 (×5): qty 100

## 2023-08-06 MED ORDER — SODIUM CHLORIDE 0.9% FLUSH
3.0000 mL | Freq: Two times a day (BID) | INTRAVENOUS | Status: DC
  Administered 2023-08-06: 3 mL via INTRAVENOUS

## 2023-08-06 MED ORDER — ORAL CARE MOUTH RINSE: 15.0000 mL | OROMUCOSAL | Status: DC | PRN

## 2023-08-06 MED ORDER — PANTOPRAZOLE SODIUM 40 MG PO TBEC
40.0000 mg | DELAYED_RELEASE_TABLET | Freq: Every day | ORAL | Status: DC
  Administered 2023-08-06 – 2023-08-09 (×4): 40 mg via ORAL
  Filled 2023-08-06 (×4): qty 1

## 2023-08-06 MED ORDER — SODIUM CHLORIDE 0.9 % IV SOLN: 250.0000 mL | INTRAVENOUS | Status: DC | PRN

## 2023-08-06 MED ORDER — GLUCAGON HCL RDNA (DIAGNOSTIC) 1 MG IJ SOLR
1.0000 mg | Freq: Once | INTRAMUSCULAR | Status: AC
  Administered 2023-08-06: 1 mg via INTRAVENOUS
  Filled 2023-08-06: qty 1

## 2023-08-06 MED ORDER — SODIUM CHLORIDE 0.9% FLUSH: 3.0000 mL | INTRAVENOUS | Status: DC | PRN

## 2023-08-06 MED ORDER — SPIRONOLACTONE 12.5 MG HALF TABLET: 50.0000 mg | ORAL_TABLET | Freq: Every day | ORAL | Status: DC

## 2023-08-06 MED ORDER — LACTATED RINGERS IV SOLN: INTRAVENOUS | Status: DC

## 2023-08-06 MED ORDER — DEXTROSE 50 % IV SOLN: 1.0000 | Freq: Every day | INTRAVENOUS | Status: DC | PRN

## 2023-08-06 MED ORDER — SODIUM CHLORIDE 0.9 % IV BOLUS: 500.0000 mL | Freq: Once | INTRAVENOUS | Status: DC

## 2023-08-06 MED ORDER — ONDANSETRON HCL 4 MG PO TABS: 4.0000 mg | ORAL_TABLET | Freq: Four times a day (QID) | ORAL | Status: DC | PRN

## 2023-08-06 MED ORDER — ALBUMIN HUMAN 25 % IV SOLN
50.0000 g | Freq: Once | INTRAVENOUS | Status: AC
  Administered 2023-08-06: 50 g via INTRAVENOUS
  Filled 2023-08-06: qty 200

## 2023-08-06 MED ORDER — HEPARIN SODIUM (PORCINE) 5000 UNIT/ML IJ SOLN
5000.0000 [IU] | Freq: Three times a day (TID) | INTRAMUSCULAR | Status: DC
  Administered 2023-08-06 – 2023-08-09 (×9): 5000 [IU] via SUBCUTANEOUS
  Filled 2023-08-06 (×11): qty 1

## 2023-08-06 MED ORDER — GLUCAGON HCL RDNA (DIAGNOSTIC) 1 MG IJ SOLR
1.0000 mg | Freq: Once | INTRAMUSCULAR | Status: AC
  Administered 2023-08-06: 1 mg via INTRAVENOUS
  Filled 2023-08-06 (×2): qty 1

## 2023-08-06 MED ORDER — FUROSEMIDE 20 MG PO TABS: 20.0000 mg | ORAL_TABLET | Freq: Two times a day (BID) | ORAL | Status: DC

## 2023-08-06 MED ORDER — ONDANSETRON HCL 4 MG/2ML IJ SOLN: 4.0000 mg | Freq: Four times a day (QID) | INTRAMUSCULAR | Status: DC | PRN

## 2023-08-06 MED ORDER — LACTULOSE 10 GM/15ML PO SOLN: 20.0000 g | Freq: Every evening | ORAL | Status: DC

## 2023-08-06 MED ORDER — SODIUM CHLORIDE 0.9 % IV SOLN
1.0000 g | INTRAVENOUS | Status: DC
  Administered 2023-08-06 – 2023-08-08 (×3): 1 g via INTRAVENOUS
  Filled 2023-08-06 (×3): qty 10

## 2023-08-06 MED ORDER — METOPROLOL TARTRATE 5 MG/5ML IV SOLN
5.0000 mg | Freq: Four times a day (QID) | INTRAVENOUS | Status: DC | PRN
  Administered 2023-08-06 – 2023-08-07 (×2): 5 mg via INTRAVENOUS
  Filled 2023-08-06 (×2): qty 5

## 2023-08-06 MED ORDER — LACTULOSE 10 GM/15ML PO SOLN
30.0000 g | Freq: Three times a day (TID) | ORAL | Status: DC
  Administered 2023-08-06 – 2023-08-08 (×7): 30 g via ORAL
  Filled 2023-08-06 (×7): qty 60

## 2023-08-06 MED ORDER — LACTULOSE 10 GM/15ML PO SOLN: 30.0000 g | Freq: Every morning | ORAL | Status: DC

## 2023-08-06 MED ORDER — DEXTROSE IN LACTATED RINGERS 5 % IV SOLN: INTRAVENOUS | Status: AC

## 2023-08-06 MED ORDER — SODIUM BICARBONATE 8.4 % IV SOLN
100.0000 meq | Freq: Once | INTRAVENOUS | Status: AC
  Administered 2023-08-06: 100 meq via INTRAVENOUS
  Filled 2023-08-06: qty 100

## 2023-08-06 MED ORDER — LACTULOSE 10 GM/15ML PO SOLN: 20.0000 g | Freq: Two times a day (BID) | ORAL | Status: DC

## 2023-08-06 MED ORDER — METOPROLOL TARTRATE 25 MG PO TABS: 25.0000 mg | ORAL_TABLET | Freq: Two times a day (BID) | ORAL | Status: DC

## 2023-08-06 MED ORDER — SENNOSIDES-DOCUSATE SODIUM 8.6-50 MG PO TABS: 1.0000 | ORAL_TABLET | Freq: Every evening | ORAL | Status: DC | PRN

## 2023-08-06 MED ORDER — LACTATED RINGERS IV BOLUS
500.0000 mL | Freq: Once | INTRAVENOUS | Status: AC
  Administered 2023-08-06: 500 mL via INTRAVENOUS

## 2023-08-06 MED ORDER — DEXTROSE IN LACTATED RINGERS 5 % IV SOLN: INTRAVENOUS | Status: DC

## 2023-08-06 MED ORDER — SODIUM CHLORIDE 0.9 % IV SOLN: INTRAVENOUS | Status: DC

## 2023-08-06 MED ORDER — LACTULOSE 10 GM/15ML PO SOLN: 20.0000 g | Freq: Every day | ORAL | Status: DC

## 2023-08-06 MED ORDER — RIFAXIMIN 550 MG PO TABS
550.0000 mg | ORAL_TABLET | Freq: Two times a day (BID) | ORAL | Status: DC
  Administered 2023-08-06 – 2023-08-09 (×7): 550 mg via ORAL
  Filled 2023-08-06 (×7): qty 1

## 2023-08-06 MED ORDER — ALBUMIN HUMAN 25 % IV SOLN: 50.0000 g | Freq: Once | INTRAVENOUS | Status: DC

## 2023-08-06 MED ORDER — ACETAMINOPHEN 650 MG RE SUPP: 650.0000 mg | Freq: Four times a day (QID) | RECTAL | Status: DC | PRN

## 2023-08-06 MED ORDER — DEXTROSE 50 % IV SOLN
1.0000 | Freq: Once | INTRAVENOUS | Status: AC
  Administered 2023-08-06: 50 mL via INTRAVENOUS
  Filled 2023-08-06: qty 50

## 2023-08-06 MED ORDER — ACETAMINOPHEN 325 MG PO TABS: 650.0000 mg | ORAL_TABLET | Freq: Four times a day (QID) | ORAL | Status: DC | PRN

## 2023-08-06 MED ORDER — SODIUM CHLORIDE 0.9 % IV BOLUS
1000.0000 mL | Freq: Once | INTRAVENOUS | Status: AC
  Administered 2023-08-06: 1000 mL via INTRAVENOUS

## 2023-08-06 NOTE — Progress Notes (Deleted)
Patient blood pressure is persistently on the lower side.  Also patient developed 1 episode of hypoglycemia blood glucose dropped to 35 which improved to 116 with dextrose bolus however again trending down to 85. - Changing maintenance fluid NS to D5- LR 100 cc/h. - Giving albumin 50 g IV once.   Tereasa Coop, MD [facility] 08/06/2023, 5:51 AM

## 2023-08-06 NOTE — H&P (Addendum)
History and Physical    Dana Dyer UJW:[id number] DOB: 1951-11-22 DOA: 07/09/2023  PCP: Karie Georges, MD   Patient coming from: Home   Chief Complaint:  Chief Complaint  Patient presents with   Altered Mental Status    HPI:  Dana Dyer is a 72 y.o. female with medical history significant of Elita Boone cirrhosis, chronic ascites, hypertension, hepatic encephalopathy, GERD, multiple cystic pancreatic lesion, GERD, hemorrhoid s/p banding, insulin dependent DM type II and generalized anxiety disorder presented to emergency department for evaluation for altered mental status.  Patient is alert oriented x 1.  Family reported for last few days patient is not eating and drinking well as well as she is not taking medications.  Patient did not have any fall.  During my evaluation patient is alert and oriented to self.  She is complaining about some abdominal pain.  Denies any chest pain, shortness of breath, palpitation and headache.  Patient's family reported patient has some bladder and stool incontinence for last few days.   Patient's family reported that she usually goes for outpatient paracentesis every 2 weeks and last paracentesis was done 4 days ago.   ED Course:  At initial presentation patient heart rate 100, respiratory 18, blood pressure 119/70 and O2 sat 100% room air. CMP showing sodium 132, potassium 3.6, chloride 100, low bicarb 16, BUN 49, elevated creatinine 2.26, calcium 8.7, low albumin 2.6, AST 65, ALT 77, ALP 120, bilirubin 2.1, GFR 22 and elevated anion gap 16. CBC unremarkable stable H&H 9.6 and 30.,  WBC 10.3 and platelet 110. Elevated ammonia level 49. Lactic acid 1.7 within normal range. Elevated pro time 15.7 and INR 1.7.  Chest x-ray unremarkable.  Patient has been seeing outpatient gastroenterology Dr. Doristine Locks on 07/10/2023.  For the management of Elita Boone related cirrhosis gastroenterology recommended to continue lactulose, Lasix 20 mg twice daily,  Aldactone 50 mg daily, metoprolol and rifaximin.  With the concern for dehydration and development of AKI in the ED patient has been treated with NS 1 L bolus.  Hospitalist has been contacted for admission and further management.   Review of Systems:  Review of Systems  Reason unable to perform ROS: Patient is opening eyes with voice command.  Constitutional:  Negative for chills, fever, malaise/fatigue and weight loss.  Respiratory:  Negative for cough, sputum production and shortness of breath.   Cardiovascular:  Negative for chest pain.  Gastrointestinal:  Negative for abdominal pain, diarrhea, heartburn, nausea and vomiting.  Genitourinary:  Negative for dysuria and urgency.  Musculoskeletal:  Negative for myalgias and neck pain.  Neurological:  Negative for dizziness and headaches.  Psychiatric/Behavioral:  The patient is not nervous/anxious.     Past Medical History:  Diagnosis Date   ALLERGIC RHINITIS 10/09/2007   Allergy    Arthritis    hips, spine   DIABETES MELLITUS, UNCONTROLLED 03/16/2009   FATIGUE 10/21/2008   FIBROMYALGIA 10/09/2007   GERD (gastroesophageal reflux disease)    diet  controlled, no meds   H/O vaginal hysterectomy    HYPERLIPIDEMIA 10/09/2007   diet controlled, no meds   HYPERTENSION 10/09/2007   Morton's neuroma    OBSTRUCTIVE SLEEP APNEA 10/09/2007   Palpitations    Plantar fasciitis    History - right   Sleep apnea    Uses CPAP   TRANSAMINASES, SERUM, ELEVATED 07/08/2009    Past Surgical History:  Procedure Laterality Date   ABDOMINAL HYSTERECTOMY  1979   prolapsed uterus   BASAL CELL CARCINOMA  EXCISION     BREAST BIOPSY Left 2012   left 2012 stereo   BREAST REDUCTION SURGERY Bilateral 1992   COLONOSCOPY  2006   [facility]   IR PARACENTESIS  08/01/2023   mamoplasty     reduction   REDUCTION MAMMAPLASTY Bilateral 1992   TONSILLECTOMY  1959   TUBAL LIGATION  1976     reports that she has never smoked. She has never used smokeless  tobacco. She reports that she does not drink alcohol and does not use drugs.  Allergies  Allergen Reactions   Amoxicillin-Pot Clavulanate     Severe diarrhea   Codeine Sulfate     REACTION: "hyper"   Diflucan [Fluconazole] Hives   Epinephrine Other (See Comments)    Racing heart rate   Erythromycin Base Diarrhea and Other (See Comments)    cramps   Invokana [Canagliflozin] Other (See Comments)    Recurrent yeast infections   Irbesartan-Hydrochlorothiazide Other (See Comments)    Unknown reaction   Victoza [Liraglutide] Nausea Only   Latex Rash    Long term use   Ozempic (0.25 Or 0.5 Mg-Dose) [Semaglutide(0.25 Or 0.5mg -Dos)] Nausea Only    Family History  Problem Relation Age of Onset   Diabetes Mother    Arthritis Mother    Breast cancer Mother 67   Alcohol abuse Father    Arthritis Father    Hypertension Maternal Grandfather    Miscarriages / Stillbirths Paternal Grandfather    Breast cancer Maternal Aunt        diagnosed in her 16's   Colon polyps Neg Hx    Colon cancer Neg Hx    Esophageal cancer Neg Hx    Rectal cancer Neg Hx    Stomach cancer Neg Hx     Prior to Admission medications   Medication Sig Start Date End Date Taking? Authorizing Provider  clobetasol cream (TEMOVATE) 0.05 % Apply 1 Application topically 2 (two) times daily as needed (yeast infection).    [provider]  Continuous Blood Gluc Sensor (DEXCOM G7 SENSOR) MISC 1 Device by Does not apply route as directed. Every 10 days 05/09/22   Shamleffer, Konrad Dolores, MD  furosemide (LASIX) 20 MG tablet Take 1-2 tablets (20-40 mg total) by mouth 2 (two) times daily. Hold if wt. Below 149 lbs. 07/13/23   Karie Georges, MD  hydrocortisone (ANUSOL-HC) 25 MG suppository Place 1 suppository (25 mg total) rectally at bedtime. 07/11/23   Cirigliano, Vito V, DO  insulin glargine (LANTUS SOLOSTAR) 100 UNIT/ML Solostar Pen Inject 26 Units into the skin daily. 06/15/23   Shamleffer, Konrad Dolores, MD   insulin lispro (HUMALOG KWIKPEN) 200 UNIT/ML KwikPen Max daily 60 units 06/15/23   Shamleffer, Konrad Dolores, MD  Insulin Pen Needle (BD PEN NEEDLE NANO 2ND GEN) 32G X 4 MM MISC 1 Device by Other route in the morning, at noon, in the evening, and at bedtime. 06/15/23   Shamleffer, Konrad Dolores, MD  lactulose (CHRONULAC) 10 GM/15ML solution 3 tablespoons for breakfast, 2 tablespoons with lunch and dinner 07/05/23   [place], Eric J, DO  metFORMIN (GLUCOPHAGE) 1000 MG tablet Take 1,000 mg by mouth 2 (two) times daily with a meal. Patient not taking: Reported on 07/13/2023    [provider]  metFORMIN (GLUCOPHAGE-XR) 750 MG 24 hr tablet Take 1 tablet (750 mg total) by mouth 2 (two) times daily. 06/15/23   Shamleffer, Konrad Dolores, MD  metoprolol tartrate (LOPRESSOR) 25 MG tablet Take 1 tablet (25 mg total) by mouth  2 (two) times daily. 07/17/23   Nahser, Deloris Ping, MD  mirtazapine (REMERON SOL-TAB) 15 MG disintegrating tablet Start with 1/2 tablet daily at bedtime for 1 week, then increase to 1 tablet daily at bedtime. 06/27/23   Karie Georges, MD  omeprazole (PRILOSEC) 20 MG capsule Take 20 mg by mouth daily as needed (heartburn).    [provider]  ondansetron (ZOFRAN) 4 MG tablet Take 1 tablet (4 mg total) by mouth every 6 (six) hours as needed for nausea or vomiting. 08/04/23   Iva Boop, MD  rifaximin (XIFAXAN) 550 MG TABS tablet Take 1 tablet (550 mg total) by mouth 2 (two) times daily. 01/18/23   Cirigliano, Vito V, DO  spironolactone (ALDACTONE) 50 MG tablet Take 1-2 tablets (50-100 mg total) by mouth daily. 07/13/23   Karie Georges, MD  vitamin k 100 MCG tablet Take 100 mcg by mouth daily.    [provider]  zinc gluconate 50 MG tablet Take 1 tablet (50 mg total) by mouth daily. 10/20/22   Doree Albee, PA-C     Physical Exam: Vitals:   08/06/23 0345 08/06/23 0400 08/06/23 0415 08/06/23 0430  BP: (!) 90/52 (!) 98/51 (!) 84/54 99/73  Pulse: 83  80 87 85  Resp: (!) 22 18 (!) 24 10  Temp:      TempSrc:      SpO2: 100% 100% 98% 100%  Weight:        Physical Exam HENT:     Nose: Nose normal.     Mouth/Throat:     Mouth: Mucous membranes are moist.  Eyes:     Pupils: Pupils are equal, round, and reactive to light.  Cardiovascular:     Rate and Rhythm: Normal rate and regular rhythm.     Pulses: Normal pulses.     Heart sounds: Murmur heard.  Pulmonary:     Breath sounds: Normal breath sounds.  Abdominal:     General: Bowel sounds are normal. There is distension.     Palpations: There is no mass.     Tenderness: There is no abdominal tenderness. There is no guarding or rebound.     Comments: Patient has some fluid thrill.  Musculoskeletal:     Cervical back: Neck supple.     Right lower leg: No edema.     Left lower leg: No edema.  Skin:    General: Skin is dry.     Capillary Refill: Capillary refill takes less than 2 seconds.  Neurological:     Mental Status: She is alert.     Comments: Patient is alert oriented to self and place.  Psychiatric:     Comments: Unable to assess      Labs on Admission: I have personally reviewed following labs and imaging studies  CBC: Recent Labs  Lab 07/06/2023 2156 08/06/23 0300  WBC 10.3 6.7  NEUTROABS 7.7  --   HGB 9.6* 8.5*  HCT 30.4* 26.6*  MCV 83.3 82.6  PLT 110* 84*   Basic Metabolic Panel: Recent Labs  Lab 07/18/2023 2156 08/06/23 0300  NA 132* 135  K 3.6 3.1*  CL 100 103  CO2 16* 16*  GLUCOSE 83 161*  BUN 49* 49*  CREATININE 2.26* 2.06*  CALCIUM 8.7* 8.0*   GFR: Estimated Creatinine Clearance: 23.4 mL/min (A) (by C-G formula based on SCr of 2.06 mg/dL (H)). Liver Function Tests: Recent Labs  Lab 07/10/2023 2156 08/06/23 0300  AST 65* 53*  ALT 77* 63*  ALKPHOS 120 101  BILITOT 2.1* 2.2*  PROT 6.0* 5.2*  ALBUMIN 2.6* 2.2*   No results for input(s): "LIPASE", "AMYLASE" in the last 168 hours. Recent Labs  Lab 07/18/2023 2207  AMMONIA 49*    Coagulation Profile: Recent Labs  Lab 08/06/23 0010  INR 1.2   Cardiac Enzymes: No results for input(s): "CKTOTAL", "CKMB", "CKMBINDEX", "TROPONINI", "TROPONINIHS" in the last 168 hours. BNP (last 3 results) No results for input(s): "BNP" in the last 8760 hours. HbA1C: No results for input(s): "HGBA1C" in the last 72 hours. CBG: Recent Labs  Lab 08/06/23 0240 08/06/23 0304 08/06/23 0352  GLUCAP 35* 103* 116*   Lipid Profile: No results for input(s): "CHOL", "HDL", "LDLCALC", "TRIG", "CHOLHDL", "LDLDIRECT" in the last 72 hours. Thyroid Function Tests: No results for input(s): "TSH", "T4TOTAL", "FREET4", "T3FREE", "THYROIDAB" in the last 72 hours. Anemia Panel: No results for input(s): "VITAMINB12", "FOLATE", "FERRITIN", "TIBC", "IRON", "RETICCTPCT" in the last 72 hours. Urine analysis:    Component Value Date/Time   COLORURINE YELLOW 08/06/2023 0305   APPEARANCEUR CLEAR 08/06/2023 0305   LABSPEC 1.011 08/06/2023 0305   PHURINE 5.0 08/06/2023 0305   GLUCOSEU NEGATIVE 08/06/2023 0305   HGBUR NEGATIVE 08/06/2023 0305   HGBUR negative 01/08/2010 0826   BILIRUBINUR NEGATIVE 08/06/2023 0305   BILIRUBINUR n 06/12/2014 0916   KETONESUR NEGATIVE 08/06/2023 0305   PROTEINUR NEGATIVE 08/06/2023 0305   UROBILINOGEN 1.0 06/12/2014 0916   UROBILINOGEN 1.0 01/08/2010 0826   NITRITE NEGATIVE 08/06/2023 0305   LEUKOCYTESUR NEGATIVE 08/06/2023 0305    Radiological Exams on Admission: I have personally reviewed images US Abdomen Limited  Result Date: 08/06/2023 CLINICAL DATA:  Ascites EXAM: LIMITED ABDOMEN ULTRASOUND FOR ASCITES TECHNIQUE: Limited ultrasound survey for ascites was performed in all four abdominal quadrants. COMPARISON:  07/21/2023 FINDINGS: Small to moderate free fluid in the abdomen and pelvis, most notable in the right upper quadrant. This is significantly smaller than prior study. IMPRESSION: Small to moderate free fluid, significantly decreased since prior study.  Electronically Signed   By: Charlett Nose M.D.   On: 08/06/2023 02:06   DG Chest Port 1 View  Result Date: 08/06/2023 CLINICAL DATA:  Altered mental status EXAM: PORTABLE CHEST 1 VIEW COMPARISON:  07/02/2023 FINDINGS: Mild elevation of the right hemidiaphragm. Heart and mediastinal contours are within normal limits. No focal opacities or effusions. No acute bony abnormality. IMPRESSION: No active disease. Electronically Signed   By: Charlett Nose M.D.   On: 08/06/2023 00:01    EKG: My personal interpretation of EKG shows: Sinus tachycardia heart rate 104.  There is no ST and T wave abnormality.    Assessment/Plan: Principal Problem:   Acute hepatic encephalopathy (HCC) Active Problems:   Type 2 diabetes mellitus without complication, with long-term current use of insulin (HCC)   Essential hypertension   Thrombocytopenia (HCC)   Liver cirrhosis secondary to NASH (HCC)   Dehydration   AKI (acute kidney injury) (HCC)   GERD (gastroesophageal reflux disease)   Metabolic acidosis   Dysphagia   GAD (generalized anxiety disorder)   Normocytic anemia    Assessment and Plan: Acute hepatic encephalopathy History of hepatic cirrhosis secondary to NASH -Patient presenting from home by family with concern for not eating and drinking for last few days.  Also she has altered mentation.  She is more confused than her baseline and declining oral food and medication. - Patient has established diagnosis of hepatic cirrhosis secondary to NASH.Patient MEL D score 22 which indicates 11-month mortality rate with  19.6% and child pug score 11 point indicate flat expectancy 1 to 3 years. -Lab work showed elevated ammonia 49. -CMP showed sodium 132, low bicarb 16, BUN 49, creatinine 2.26, low albumin 2.6, elevated AST 65/ALT 77, elevated bilirubin 2.1 and anion gap 16.  Elevated PT 17.5 - Acute hepatic encephalopathy in the setting of development and building up now ammonia as patient is not taking medications  appropriately. -First will do bedside swallow screen.  If patient passed bedside swallow screen plan to continue lactulose and rifaximin. - Patient takes lactulose solution 30 g in the morning, 20 g in the lunchtime and 20 g in the morning. -At home patient is on metoprolol 25 mg twice daily, Lasix 20 mg twice daily and spironolactone 50 mg daily.  In the setting of acute kidney injury holding any blood pressure regimen.  Patient is also borderline hypotensive. -Continue lactulose and rifaximin 550 mg twice daily. -Obtaining hepatic ultrasound to assess for ascites. -Will do diagnostic paracentesis.  Obtaining blood culture and urine culture. -Continue to monitor for improvement of mentation.  Neurocheck every 4 hours. - Continue fall precaution, aspiration precaution and keep head of the bed rise 30 degree angle. -Continue cardiac monitoring. -Continue to monitor CBC, CMP and liver function. -Patient follows outpatient [facility]. Please reach out and consult GI in the a.m.  Hyperammonemia -Elevated ammonia 43. -Continue lactulose   Hypodensity of the left liver lobe Multiple cystic lesion of the pancreas - Per chart review previous CT abdomen pelvis showed hepatic lesion and multiple cystic lesion in the pancreas.  Patient has been scheduled for outpatient MRCP on 08/20/2023 arranged by [facility].   Acute kidney injury Dehydration Poor oral intake -Elevated creatinine 2.26.  Patient has baseline normal renal function however she had multiple episodes of AKI over the course of last 2 years. -Acute kidney injury in the setting of poor oral intake which has been contributed to dehydration. - In the ED patient received NS 1 L of bolus. - Continue NS 100 cc/h for 1 day.  Chronic hyponatremia -Serum sodium 132 which is around patient baseline - Continue to monitor serum sodium level  Anion gap metabolic acidosis -Low bicarb 16 and elevated anion gap seen as well. - Anion gap metabolic  acidosis in the setting of AKI. -Evening patient 1 amp of bicarb 100 mEq once. - Continue IV hydration for the management of AKI.  Hoping that with treatment of AKI bicarb will improve as well.  GERD -Continue Protonix 40 mg daily  Dysphagia -Patient family reported patient has poor appetite for last few days.  They are unsure if patient has difficulty swallowing or not. - Doing bedside swallow screen.  If patient passed bedside swallow screen then will continue oral medications. - Obtaining speech consult for swallow evaluation in the a.m.   Insulin-dependent diabetes type 2 -At home patient is on Glucophage 750 mg twice daily, Lantus 26 unit daily. - Currently patient is n.p.o. and waiting for swallow screen.  Holding insulin for now. -Continue to check POC blood glucose 4 times daily.  Fecal incontinence - Patient family reported patient has some fecal incontinence likely secondary to lactulose use. - If needed will get an rectal tube placement.  History of internal hemorrhoid status post banding x 2 - Patient does not have any bleeding per rectum and no blood with the stool.  Continue to monitor.  Chronic thrombocytopenia -Platelet count 110.  Baseline platelet is around 100.  Continue to monitor.  Patient does  not have any active mucosal bleeding or bruising.  Chronic normocytic anemia - Stable H&H 9.5 and 30.4. Continue to monitor CBC.  Addendum: Around 4:30 AM patient nurse at the bedside reported that patient blood glucose is 35 which has been improved with D50 W bolus.  Also rectal temperature is low 95.  Requesting for higher level of care. I am changing bed request medical-telemetry to progressive unit.  DVT prophylaxis:  SQ Heparin Code Status:  Full Code Diet: Currently n.p.o.  Due to altered mentation and risk for aspiration with food Family Communication: Discussed treatment plan with patient's son and other family member at the bedside Disposition Plan: Pending  clinical improvement.  Tentative discharge to home next 2 to 3 days Consults: Gastroenterology Admission status:   Inpatient, progressive unit  Severity of Illness: The appropriate patient status for this patient is INPATIENT. Inpatient status is judged to be reasonable and necessary in order to provide the required intensity of service to ensure the patient's safety. The patient's presenting symptoms, physical exam findings, and initial radiographic and laboratory data in the context of their chronic comorbidities is felt to place them at high risk for further clinical deterioration. Furthermore, it is not anticipated that the patient will be medically stable for discharge from the hospital within 2 midnights of admission.   * I certify that at the point of admission it is my clinical judgment that the patient will require inpatient hospital care spanning beyond 2 midnights from the point of admission due to high intensity of service, high risk for further deterioration and high frequency of surveillance required.Marland Kitchen    Tereasa Coop, MD [facility]  How to contact the [facility] Attending or Consulting provider 7A - 7P or covering provider during after hours 7P -7A, for this patient.  Check the care team in [facility] and look for a) attending/consulting TR[facility] provider listed and b) the [facility] team listed Log into [email or web address] and use [place]'s universal password to access. If you do not have the password, please contact the hospital operator. Locate the [facility] provider you are looking for under [facility] and page to a number that you can be directly reached. If you still have difficulty reaching the provider, please page the [facility] (Director on Call) for the Hospitalists listed on amion for assistance.  08/06/2023, 5:01 AM

## 2023-08-06 NOTE — Evaluation (Signed)
Clinical/Bedside Swallow Evaluation Patient Details  Name: Dana Dyer MRN: [id number] Date of Birth: 1951/08/04  Today's Date: 08/06/2023 Time: SLP Start Time (ACUTE ONLY): 1232 SLP Stop Time (ACUTE ONLY): 1249 SLP Time Calculation (min) (ACUTE ONLY): 17 min  Past Medical History:  Past Medical History:  Diagnosis Date   ALLERGIC RHINITIS 10/09/2007   Allergy    Arthritis    hips, spine   DIABETES MELLITUS, UNCONTROLLED 03/16/2009   FATIGUE 10/21/2008   FIBROMYALGIA 10/09/2007   GERD (gastroesophageal reflux disease)    diet  controlled, no meds   H/O vaginal hysterectomy    HYPERLIPIDEMIA 10/09/2007   diet controlled, no meds   HYPERTENSION 10/09/2007   Morton's neuroma    OBSTRUCTIVE SLEEP APNEA 10/09/2007   Palpitations    Plantar fasciitis    History - right   Sleep apnea    Uses CPAP   TRANSAMINASES, SERUM, ELEVATED 07/08/2009   Past Surgical History:  Past Surgical History:  Procedure Laterality Date   ABDOMINAL HYSTERECTOMY  1979   prolapsed uterus   BASAL CELL CARCINOMA EXCISION     BREAST BIOPSY Left 2012   left 2012 stereo   BREAST REDUCTION SURGERY Bilateral 1992   COLONOSCOPY  2006   [facility]   IR PARACENTESIS  08/01/2023   mamoplasty     reduction   REDUCTION MAMMAPLASTY Bilateral 1992   TONSILLECTOMY  1959   TUBAL LIGATION  1976   HPI:  Pt is a 72 yo female presenting 8/31 with AMS suspected to be acute hepatic encephalopathy. PMH includes: GERD, Elita Boone cirrhosis, chronic ascites, HTN, hepatic encephalopathy, multiple cystic pancreatic lesion, hemorrhoid s/p banding, DM II, generalized anxiety disorder    Assessment / Plan / Recommendation  Clinical Impression  Pt's oropharyngeal swallow appears to be grossly functional, with no signs of dysphagia other than a single throat clear after initial straw sip of thin liquid. Pt does however have risk factors for aspiration and/or adverse events in the setting of aspiration, including altered mentation  and reduced respiratory status with shortness of breath noted across PO trials. Discussed with pt and RN - will leave on current diet, but would follow aspiration precautions carefully and would include rest breaks PRN during meals for breathing. SLP will f/u at least briefly. SLP Visit Diagnosis: Dysphagia, unspecified (R13.10)    Aspiration Risk  Mild aspiration risk    Diet Recommendation Regular;Thin liquid    Liquid Administration via: Cup;Straw Medication Administration: Whole meds with liquid Supervision: Staff to assist with self feeding Compensations: Minimize environmental distractions;Slow rate;Small sips/bites Postural Changes: Seated upright at 90 degrees;Remain upright for at least 30 minutes after po intake    Other  Recommendations Oral Care Recommendations: Oral care BID    Recommendations for follow up therapy are one component of a multi-disciplinary discharge planning process, led by the attending physician.  Recommendations may be updated based on patient status, additional functional criteria and insurance authorization.  Follow up Recommendations No SLP follow up      Assistance Recommended at Discharge    Functional Status Assessment Patient has had a recent decline in their functional status and demonstrates the ability to make significant improvements in function in a reasonable and predictable amount of time.  Frequency and Duration min 2x/week  2 weeks       Prognosis Prognosis for improved oropharyngeal function: Good      Swallow Study   General HPI: Pt is a 72 yo female presenting 8/31 with AMS suspected to be  acute hepatic encephalopathy. PMH includes: GERD, Elita Boone cirrhosis, chronic ascites, HTN, hepatic encephalopathy, multiple cystic pancreatic lesion, hemorrhoid s/p banding, DM II, generalized anxiety disorder Type of Study: Bedside Swallow Evaluation Previous Swallow Assessment: none in chart Diet Prior to this Study: Regular;Thin liquids (Level  0) Temperature Spikes Noted: No Respiratory Status: Room air History of Recent Intubation: No Behavior/Cognition: Alert;Cooperative;Pleasant mood;Requires cueing Oral Cavity Assessment: Within Functional Limits Oral Care Completed by SLP: No Oral Cavity - Dentition: Adequate natural dentition Vision: Functional for self-feeding Self-Feeding Abilities: Able to feed self Patient Positioning: Upright in bed Baseline Vocal Quality: Normal Volitional Cough: Strong Volitional Swallow: Able to elicit    Oral/Motor/Sensory Function Overall Oral Motor/Sensory Function: Within functional limits   Ice Chips Ice chips: Not tested   Thin Liquid Thin Liquid: Impaired Presentation: Cup;Self Fed;Straw Pharyngeal  Phase Impairments: Throat Clearing - Immediate (x1)    Nectar Thick Nectar Thick Liquid: Not tested   Honey Thick Honey Thick Liquid: Not tested   Puree Puree: Within functional limits Presentation: Spoon   Solid     Solid: Within functional limits       Mahala Menghini., M.A. CCC-SLP Acute Rehabilitation Services Office [phone]  Secure chat preferred  08/06/2023,12:57 PM

## 2023-08-06 NOTE — Significant Event (Addendum)
[facility] Night coverage note:  Called by RN: pt with sustained S. Tach 130.  Asymptomatic.  Afebrile, BP 140/69  Maybe rebound tachycardia from coming off of her chronic metoprolol which was held this admission?  Metoprolol PO held last night due to hypotension (hypotension likely due to vasodilation from rewarming with bair hugger, hypothermia last night likely due to hypoglycemia).  Ordered EKG Will order 5mg  metoprolol IV PRN for the moment

## 2023-08-06 NOTE — Progress Notes (Deleted)
  Patient nurse at the bedside reported that patient blood glucose is 35 which has been improved with D50 W bolus.  Also rectal temperature is low 95.  Requesting for higher level of care. I am changing bed request medical-telemetry to progressive unit.  Tereasa Coop, MD [facility] 08/06/2023, 5:05 AM

## 2023-08-06 NOTE — Progress Notes (Deleted)
  Patient nurse at the bedside reported that patient blood glucose is 35 which has been improved with D50 W bolus.  Also rectal temperature is low 95.  Requesting for higher level of care. I am changing bed request medical-telemetry to progressive unit.  Tereasa Coop, MD [facility] 08/06/2023, 4:18 AM

## 2023-08-06 NOTE — Significant Event (Addendum)
Patient's blood pressure is persistently on the lower side.  Also patient developed 1 episode of hypoglycemia blood glucose dropped to 35 which improved to 116 with dextrose bolus however again trending down to 85.  Patient also developing hypothermia.  Temperature dropped to 95. - Changing maintenance fluid NS to D5- LR 100 cc/h. - Giving albumin 50 g IV once. -Data processing manager.      Tereasa Coop, MD [facility] 08/06/2023, 5:51 AM

## 2023-08-06 NOTE — Evaluation (Signed)
Physical Therapy Evaluation Patient Details Name: Dana Dyer MRN: [id number] DOB: 03-11-1951 Today's Date: 08/06/2023  History of Present Illness  The pt is a 72 yo female presenting 8/31 with AMS. Pt found to be severely dehydrated causing AKI which led to insulin build up causing hypoglycemia and hypothermia. PMH includes: NASH cirrhosis, HTN, hepatic encephalopathy, GERD, DM II, and anxiety.   Clinical Impression  Pt in bed upon arrival of PT, agreeable to evaluation at this time. Prior to admission the pt was mobilizing without use of DME or assist for ADLs. The pt now presents with limitations in functional mobility, safety awareness, activity tolerance, and dynamic stability due to above dx, and will continue to benefit from skilled PT to address these deficits. The pt was able to complete transition to [facility] with min-modA and cues for hand placement, and likewise needed minA and max cues to complete bed mobility. The pt's daughter was present and asking about how to best assist the patient, we discussed body mechanics and gait belt was provided. The hope is for the pt to improve to be able to return home with assist from family, but she is not yet able to complete household distances and is not yet safe to return home. Will continue to progress endurance and stability to allow for safest possible transition to home. Pt will benefit from HHPT to continue improving safety with mobility in the home as well as [facility] aide as available.          If plan is discharge home, recommend the following: A lot of help with walking and/or transfers;A lot of help with bathing/dressing/bathroom;Assistance with cooking/housework;Direct supervision/assist for medications management;Direct supervision/assist for financial management;Assist for transportation;Help with stairs or ramp for entrance   Can travel by private vehicle        Equipment Recommendations Wheelchair (measurements PT);Wheelchair cushion  (measurements PT);BSC/3in1;Rolling walker (2 wheels)  Recommendations for Other Services  OT consult    Functional Status Assessment Patient has had a recent decline in their functional status and demonstrates the ability to make significant improvements in function in a reasonable and predictable amount of time.     Precautions / Restrictions Precautions Precautions: Fall Precaution Comments: watch HR Restrictions Weight Bearing Restrictions: No      Mobility  Bed Mobility Overal bed mobility: Needs Assistance Bed Mobility: Supine to Sit, Sit to Supine     Supine to sit: Min assist, HOB elevated, Used rails Sit to supine: Min assist, HOB elevated   General bed mobility comments: minA with max cues to sequence getting out of bed    Transfers Overall transfer level: Needs assistance Equipment used: 1 person hand held assist Transfers: Sit to/from Stand, Bed to chair/wheelchair/BSC Sit to Stand: Min assist, Mod assist   Step pivot transfers: Mod assist       General transfer comment: minA to rise, modA to complete step to [facility]. pt needing cues for each part of mobility and repeated cues about where she is going    Ambulation/Gait               General Gait Details: limited to short bout of stepping to BSC, HR 120-127bpm after pivot from [facility], with 2/3 DOE.     Balance Overall balance assessment: Needs assistance Sitting-balance support: No upper extremity supported, Feet supported Sitting balance-Leahy Scale: Fair     Standing balance support: Single extremity supported, During functional activity Standing balance-Leahy Scale: Poor Standing balance comment: dependent on single UE support  Pertinent Vitals/Pain Pain Assessment Pain Assessment: No/denies pain Faces Pain Scale: No hurt    Home Living Family/patient expects to be discharged to:: Private residence Living Arrangements: Spouse/significant  other Available Help at Discharge: Family;Available 24 hours/day Type of Home: House Home Access: Stairs to enter Entrance Stairs-Rails: Right Entrance Stairs-Number of Steps: 4   Home Layout: One level Home Equipment: Rollator (4 wheels) Additional Comments: husband just d/c from hospital, will be relying on daughter for assist    Prior Function Prior Level of Function : Independent/Modified Independent             Mobility Comments: walks without AD ADLs Comments: independent     Extremity/Trunk Assessment   Upper Extremity Assessment Upper Extremity Assessment: Generalized weakness;Difficult to assess due to impaired cognition    Lower Extremity Assessment Lower Extremity Assessment: Overall WFL for tasks assessed;Difficult to assess due to impaired cognition    Cervical / Trunk Assessment Cervical / Trunk Assessment: Kyphotic  Communication   Communication Communication: No apparent difficulties Cueing Techniques: Verbal cues;Gestural cues;Visual cues  Cognition Arousal: Alert Behavior During Therapy: WFL for tasks assessed/performed Overall Cognitive Status: Impaired/Different from baseline Area of Impairment: Attention, Memory, Following commands, Safety/judgement, Awareness, Problem solving                   Current Attention Level: Focused Memory: Decreased recall of precautions, Decreased short-term memory Following Commands: Follows one step commands inconsistently, Follows one step commands with increased time Safety/Judgement: Decreased awareness of safety, Decreased awareness of deficits Awareness: Intellectual Problem Solving: Slow processing, Decreased initiation, Difficulty sequencing, Requires verbal cues General Comments: pt needing repeated cues and giving inconsistent answers to questions regarding house set up. daughter present and reports this AMS is "new normal" but did not give many details        General Comments General comments  (skin integrity, edema, etc.): HR to 127 bpm with sit-stand transfers, BP stable        Assessment/Plan    PT Assessment Patient needs continued PT services  PT Problem List Decreased strength;Decreased activity tolerance;Decreased balance;Decreased mobility       PT Treatment Interventions DME instruction;Gait training;Stair training;Functional mobility training;Therapeutic activities;Therapeutic exercise;Balance training;Patient/family education    PT Goals (Current goals can be found in the Care Plan section)  Acute Rehab PT Goals Patient Stated Goal: return home, plan for daughter to assist PT Goal Formulation: With patient/family Time For Goal Achievement: 08/20/23 Potential to Achieve Goals: Good    Frequency Min 1X/week        AM-PAC PT "6 Clicks" Mobility  Outcome Measure Help needed turning from your back to your side while in a flat bed without using bedrails?: A Little Help needed moving from lying on your back to sitting on the side of a flat bed without using bedrails?: A Little Help needed moving to and from a bed to a chair (including a wheelchair)?: A Little Help needed standing up from a chair using your arms (e.g., wheelchair or bedside chair)?: A Little Help needed to walk in hospital room?: Total (<20 ft) Help needed climbing 3-5 steps with a railing? : Total 6 Click Score: 14    End of Session Equipment Utilized During Treatment: Gait belt Activity Tolerance: Patient limited by fatigue;Patient tolerated treatment well Patient left: in bed;with call bell/phone within reach;with bed alarm set;with family/visitor present Nurse Communication: Mobility status PT Visit Diagnosis: Unsteadiness on feet (R26.81);Muscle weakness (generalized) (M62.81)    Time: 1610-9604 PT Time Calculation (min) (ACUTE  ONLY): 44 min   Charges:   PT Evaluation $PT Eval Low Complexity: 1 Low PT Treatments $Therapeutic Activity: 8-22 mins $Self Care/Home Management:  8-22 PT General Charges $$ ACUTE PT VISIT: 1 Visit         Vickki Muff, PT, DPT   Acute Rehabilitation Department Office [phone] Secure Chat Communication Preferred  Ronnie Derby 08/06/2023, 5:17 PM

## 2023-08-06 NOTE — Progress Notes (Signed)
PROGRESS NOTE                                                                                                                                                                                                             Patient Demographics:    Dana Dyer, is a 72 y.o. female, DOB - 05/01/1951, NWG:[id number]  Outpatient Primary MD for the patient is Karie Georges, MD    LOS - 0  Admit date - 07/25/2023    Chief Complaint  Patient presents with   Altered Mental Status       Brief Narrative (HPI from H&P)    72 y.o. female with medical history significant of [place] cirrhosis, chronic ascites, hypertension, hepatic encephalopathy, GERD, multiple cystic pancreatic lesion, GERD, hemorrhoid s/p banding, insulin dependent DM type II and generalized anxiety disorder presented to emergency department for evaluation for altered mental status.  Further workup showed that she was severely dehydrated >>  AKI >> relative insulin overdose due to AKI,  hypoglycemia and hypothermia.  She was admitted for metabolic encephalopathy caused by combination of AKI, dehydration, hypoglycemia and mildly elevated ammonia levels.   Subjective:    Dana Dyer today has, No headache, No chest pain, No abdominal pain - No Nausea, No new weakness tingling or numbness, no SOB   Assessment  & Plan :    Severe dehydration causing AKI, due to AKI insulin buildup causing hypoglycemia, dehydration and hypoperfusion induced hypothermia.  Patient is being aggressively hydrated with IV fluids, glucagon to reverse insulin, gentle D5, oral diet, mental status improving, hold long-acting insulin for now.  Monitor close CBGs closely, Bair hugger, temperature improving as well.  Stable TSH, A1c and random cortisol.  No signs of sepsis.  Metabolic encephalopathy due to combination of severe dehydration, AKI, hypoglycemia and mildly elevated ammonia/hepatic  encephalopathy.  Continue lactulose and Xifaxan, hydrate, supportive care as above.  No headache or focal deficits.  Mental status already improving.  AKI induced mild metabolic acidosis.  Supportive care as above.  No sepsis.  History of left liver lobe hypodensity and multiple pancreatic cysts.  Following with [facility].  Due for outpatient MRCP on 08/20/2023.  History of dysphagia.  SLP follow-up.  NASH - continue lactulose along with Xifaxan, ultrasound shows not enough fluid for patient to undergo paracentesis, will for now give her  Rocephin daily x 5 days empirically.  Post discharge follow-up with [facility].  GERD - PPI  Anemia of chronic disease.  Stable monitor expect some fall due to heme dilution.  DM type II.  Currently hypoglycemic, stable A1c, hold Lantus, sliding scale insulin.  Lab Results  Component Value Date   HGBA1C 6.3 (H) 08/06/2023   CBG (last 3)  Recent Labs    08/06/23 0544 08/06/23 0748 08/06/23 0922  GLUCAP 81 57* 103*    Lab Results  Component Value Date   TSH 1.321 08/06/2023        Condition - Extremely Guarded  Family Communication  : Female relative 08/06/2023  Code Status : Full code  Consults  : None  PUD Prophylaxis : PPI   Procedures  :     Abd [place] -  Small to moderate free fluid, significantly decreased since prior study      Disposition Plan  :    Status is: Inpatient   DVT Prophylaxis  :    heparin injection 5,000 Units Start: 08/06/23 0600 SCDs Start: 08/06/23 0123    Lab Results  Component Value Date   PLT 84 (L) 08/06/2023    Diet :  Diet Order             DIET SOFT Fluid consistency: Thin  Diet effective now                    Inpatient Medications  Scheduled Meds:  glucagon (human recombinant)  1 mg Intravenous Once   heparin  5,000 Units Subcutaneous Q8H   lactulose  30 g Oral TID   pantoprazole  40 mg Oral Daily   rifaximin  550 mg Oral BID   Continuous Infusions:  dextrose 5%  lactated ringers 100 mL/hr at 08/06/23 0745   lactated ringers     lactated ringers     PRN Meds:.acetaminophen **OR** acetaminophen, dextrose, [DISCONTINUED] ondansetron **OR** ondansetron (ZOFRAN) IV, senna-docusate  Antibiotics  :    Anti-infectives (From admission, onward)    Start     Dose/Rate Route Frequency Ordered Stop   08/06/23 1000  rifaximin (XIFAXAN) tablet 550 mg        550 mg Oral 2 times daily 08/06/23 0122           Objective:   Vitals:   08/06/23 0615 08/06/23 0659 08/06/23 0700 08/06/23 0745  BP: (!) 90/47 (!) 95/48  (!) 85/46  Pulse: 96 94  95  Resp: 19 16  16   Temp:  97.8 F (36.6 C)  98 F (36.7 C)  TempSrc:  Rectal    SpO2: 100% 97%  98%  Weight:   61.1 kg   Height:   5\' 3"  (1.6 m)     Wt Readings from Last 3 Encounters:  08/06/23 61.1 kg  07/13/23 74.2 kg  07/10/23 72.1 kg    No intake or output data in the 24 hours ending 08/06/23 1023   Physical Exam  Awake , mildly confused,  No new F.N deficits, Normal affect [place].AT,PERRAL Supple Neck, No JVD,   Symmetrical Chest wall movement, Good air movement bilaterally, CTAB RRR,No Gallops,Rubs or new Murmurs,  +ve B.Sounds, Abd Soft, No tenderness,   No Cyanosis, Clubbing or edema       Data Review:    Recent Labs  Lab 08/02/2023 2156 08/06/23 0300  WBC 10.3 6.7  HGB 9.6* 8.5*  HCT 30.4* 26.6*  PLT 110* 84*  MCV 83.3 82.6  MCH  26.3 26.4  MCHC 31.6 32.0  RDW 17.1* 17.2*  LYMPHSABS 0.9  --   MONOABS 1.3*  --   EOSABS 0.3  --   BASOSABS 0.1  --     Recent Labs  Lab 07/15/2023 2156 07/06/2023 2207 08/04/2023 2244 08/06/23 0010 08/06/23 0300 08/06/23 0546 08/06/23 0755  NA 132*  --   --   --  135  --   --   K 3.6  --   --   --  3.1*  --   --   CL 100  --   --   --  103  --   --   CO2 16*  --   --   --  16*  --   --   ANIONGAP 16*  --   --   --  16*  --   --   GLUCOSE 83  --   --   --  161*  --  62*  BUN 49*  --   --   --  49*  --   --   CREATININE 2.26*  --   --   --   2.06*  --   --   AST 65*  --   --   --  53*  --   --   ALT 77*  --   --   --  63*  --   --   ALKPHOS 120  --   --   --  101  --   --   BILITOT 2.1*  --   --   --  2.2*  --   --   ALBUMIN 2.6*  --   --   --  2.2*  --   --   PROCALCITON  --   --   --   --   --  0.15  --   LATICACIDVEN  --   --  1.7  --   --   --   --   INR  --   --   --  1.2  --   --   --   TSH  --   --   --   --   --  1.321  --   HGBA1C  --   --   --   --   --  6.3*  --   AMMONIA  --  49*  --   --   --  69*  --   BNP  --   --   --   --   --  70.9  --   MG  --   --   --   --   --  1.7  --   CALCIUM 8.7*  --   --   --  8.0*  --   --       Recent Labs  Lab 07/18/2023 2156 07/11/2023 2207 07/29/2023 2244 08/06/23 0010 08/06/23 0300 08/06/23 0546  PROCALCITON  --   --   --   --   --  0.15  LATICACIDVEN  --   --  1.7  --   --   --   INR  --   --   --  1.2  --   --   TSH  --   --   --   --   --  1.321  HGBA1C  --   --   --   --   --  6.3*  AMMONIA  --  49*  --   --   --  69*  BNP  --   --   --   --   --  70.9  MG  --   --   --   --   --  1.7  CALCIUM 8.7*  --   --   --  8.0*  --     --------------------------------------------------------------------------------------------------------------- Lab Results  Component Value Date   CHOL 154 09/30/2021   HDL 59.30 09/30/2021   LDLCALC 69 09/30/2021   LDLDIRECT 124.9 10/14/2010   TRIG 128.0 09/30/2021   CHOLHDL 3 09/30/2021    Lab Results  Component Value Date   HGBA1C 6.3 (H) 08/06/2023   Recent Labs    08/06/23 0546  TSH 1.321   No results for input(s): "VITAMINB12", "FOLATE", "FERRITIN", "TIBC", "IRON", "RETICCTPCT" in the last 72 hours. ------------------------------------------------------------------------------------------------------------------ Cardiac Enzymes No results for input(s): "CKMB", "TROPONINI", "MYOGLOBIN" in the last 168 hours.  Invalid input(s): "CK"  Micro Results Recent Results (from the past 240 hour(s))  Culture, body fluid w  Gram Stain-bottle     Status: None   Collection Time: 08/01/23 11:05 AM   Specimen: Peritoneal Washings  Result Value Ref Range Status   Specimen Description PERITONEAL  Final   Special Requests NONE  Final   Culture   Final    NO GROWTH 5 DAYS Performed at [facility], [address], [place], [place] [address]    Report Status 08/06/2023 FINAL  Final  Gram stain     Status: None   Collection Time: 08/01/23 11:05 AM   Specimen: Peritoneal Washings  Result Value Ref Range Status   Specimen Description PERITONEAL  Final   Special Requests NONE  Final   Gram Stain   Final    NO WBC SEEN NO ORGANISMS SEEN Performed at [facility], [address], [place], [place] [address]    Report Status 08/01/2023 FINAL  Final    Radiology Reports US Abdomen Limited  Result Date: 08/06/2023 CLINICAL DATA:  Ascites EXAM: LIMITED ABDOMEN ULTRASOUND FOR ASCITES TECHNIQUE: Limited ultrasound survey for ascites was performed in all four abdominal quadrants. COMPARISON:  07/21/2023 FINDINGS: Small to moderate free fluid in the abdomen and pelvis, most notable in the right upper quadrant. This is significantly smaller than prior study. IMPRESSION: Small to moderate free fluid, significantly decreased since prior study. Electronically Signed   By: Charlett Nose M.D.   On: 08/06/2023 02:06   DG Chest Port 1 View  Result Date: 08/06/2023 CLINICAL DATA:  Altered mental status EXAM: PORTABLE CHEST 1 VIEW COMPARISON:  07/02/2023 FINDINGS: Mild elevation of the right hemidiaphragm. Heart and mediastinal contours are within normal limits. No focal opacities or effusions. No acute bony abnormality. IMPRESSION: No active disease. Electronically Signed   By: Charlett Nose M.D.   On: 08/06/2023 00:01      Signature  -   Susa Raring M.D on 08/06/2023 at 10:23 AM   -  To page go to [email or web address]

## 2023-08-06 NOTE — ED Notes (Signed)
Pt pale, diaphoretic, minimally responsive at this time

## 2023-08-06 NOTE — ED Notes (Signed)
In and out catheter placed to obtain urine sample, witnessed with Romero Belling, RN

## 2023-08-06 NOTE — ED Notes (Signed)
EDP at bedside to assess pt 

## 2023-08-06 NOTE — Plan of Care (Signed)
  Problem: Education: Goal: Knowledge of General Education information will improve Description Including pain rating scale, medication(s)/side effects and non-pharmacologic comfort measures Outcome: Progressing   Problem: Clinical Measurements: Goal: Ability to maintain clinical measurements within normal limits will improve Outcome: Progressing   Problem: Activity: Goal: Risk for activity intolerance will decrease Outcome: Progressing   

## 2023-08-06 NOTE — ED Notes (Signed)
Pt on bair hugger

## 2023-08-06 DEATH — deceased

## 2023-08-07 ENCOUNTER — Inpatient Hospital Stay (HOSPITAL_COMMUNITY): Payer: Medicare PPO

## 2023-08-07 DIAGNOSIS — K7460 Unspecified cirrhosis of liver: Secondary | ICD-10-CM | POA: Diagnosis not present

## 2023-08-07 DIAGNOSIS — N179 Acute kidney failure, unspecified: Secondary | ICD-10-CM

## 2023-08-07 DIAGNOSIS — K7581 Nonalcoholic steatohepatitis (NASH): Secondary | ICD-10-CM

## 2023-08-07 DIAGNOSIS — K7682 Hepatic encephalopathy: Secondary | ICD-10-CM | POA: Diagnosis not present

## 2023-08-07 DIAGNOSIS — Z515 Encounter for palliative care: Secondary | ICD-10-CM

## 2023-08-07 LAB — CBC WITH DIFFERENTIAL/PLATELET
Abs Immature Granulocytes: 0.02 10*3/uL (ref 0.00–0.07)
Basophils Absolute: 0 10*3/uL (ref 0.0–0.1)
Basophils Relative: 1 %
Eosinophils Absolute: 0.2 10*3/uL (ref 0.0–0.5)
Eosinophils Relative: 3 %
HCT: 27.2 % — ABNORMAL LOW (ref 36.0–46.0)
Hemoglobin: 8.9 g/dL — ABNORMAL LOW (ref 12.0–15.0)
Immature Granulocytes: 0 %
Lymphocytes Relative: 12 %
Lymphs Abs: 0.6 10*3/uL — ABNORMAL LOW (ref 0.7–4.0)
MCH: 27.1 pg (ref 26.0–34.0)
MCHC: 32.7 g/dL (ref 30.0–36.0)
MCV: 82.7 fL (ref 80.0–100.0)
Monocytes Absolute: 0.6 10*3/uL (ref 0.1–1.0)
Monocytes Relative: 12 %
Neutro Abs: 3.6 10*3/uL (ref 1.7–7.7)
Neutrophils Relative %: 72 %
Platelets: 83 10*3/uL — ABNORMAL LOW (ref 150–400)
RBC: 3.29 MIL/uL — ABNORMAL LOW (ref 3.87–5.11)
RDW: 17.2 % — ABNORMAL HIGH (ref 11.5–15.5)
WBC: 5 10*3/uL (ref 4.0–10.5)
nRBC: 0 % (ref 0.0–0.2)

## 2023-08-07 LAB — COMPREHENSIVE METABOLIC PANEL
ALT: 60 U/L — ABNORMAL HIGH (ref 0–44)
AST: 54 U/L — ABNORMAL HIGH (ref 15–41)
Albumin: 3.2 g/dL — ABNORMAL LOW (ref 3.5–5.0)
Alkaline Phosphatase: 106 U/L (ref 38–126)
Anion gap: 15 (ref 5–15)
BUN: 44 mg/dL — ABNORMAL HIGH (ref 8–23)
CO2: 19 mmol/L — ABNORMAL LOW (ref 22–32)
Calcium: 8.6 mg/dL — ABNORMAL LOW (ref 8.9–10.3)
Chloride: 100 mmol/L (ref 98–111)
Creatinine, Ser: 2.09 mg/dL — ABNORMAL HIGH (ref 0.44–1.00)
GFR, Estimated: 25 mL/min — ABNORMAL LOW (ref >60)
Glucose, Bld: 171 mg/dL — ABNORMAL HIGH (ref 70–99)
Potassium: 3.9 mmol/L (ref 3.5–5.1)
Sodium: 134 mmol/L — ABNORMAL LOW (ref 135–145)
Total Bilirubin: 2.4 mg/dL — ABNORMAL HIGH (ref 0.3–1.2)
Total Protein: 6.2 g/dL — ABNORMAL LOW (ref 6.5–8.1)

## 2023-08-07 LAB — GLUCOSE, CAPILLARY
Glucose-Capillary: 137 mg/dL — ABNORMAL HIGH (ref 70–99)
Glucose-Capillary: 158 mg/dL — ABNORMAL HIGH (ref 70–99)
Glucose-Capillary: 180 mg/dL — ABNORMAL HIGH (ref 70–99)
Glucose-Capillary: 196 mg/dL — ABNORMAL HIGH (ref 70–99)
Glucose-Capillary: 223 mg/dL — ABNORMAL HIGH (ref 70–99)
Glucose-Capillary: 225 mg/dL — ABNORMAL HIGH (ref 70–99)

## 2023-08-07 LAB — MAGNESIUM: Magnesium: 1.7 mg/dL (ref 1.7–2.4)

## 2023-08-07 LAB — PROCALCITONIN: Procalcitonin: 0.88 ng/mL

## 2023-08-07 LAB — TSH: TSH: 1.697 u[IU]/mL (ref 0.350–4.500)

## 2023-08-07 LAB — BRAIN NATRIURETIC PEPTIDE: B Natriuretic Peptide: 260.5 pg/mL — ABNORMAL HIGH (ref 0.0–100.0)

## 2023-08-07 LAB — AMMONIA: Ammonia: 53 umol/L — ABNORMAL HIGH (ref 9–35)

## 2023-08-07 LAB — C-REACTIVE PROTEIN: CRP: 2 mg/dL — ABNORMAL HIGH (ref <1.0)

## 2023-08-07 MED ORDER — INSULIN ASPART 100 UNIT/ML IJ SOLN
0.0000 [IU] | Freq: Three times a day (TID) | INTRAMUSCULAR | Status: DC
  Administered 2023-08-07: 1 [IU] via SUBCUTANEOUS
  Administered 2023-08-07 – 2023-08-08 (×3): 3 [IU] via SUBCUTANEOUS
  Administered 2023-08-08: 2 [IU] via SUBCUTANEOUS
  Administered 2023-08-08: 1 [IU] via SUBCUTANEOUS
  Administered 2023-08-09: 2 [IU] via SUBCUTANEOUS

## 2023-08-07 MED ORDER — INSULIN ASPART 100 UNIT/ML IJ SOLN: 0.0000 [IU] | Freq: Every day | INTRAMUSCULAR | Status: DC

## 2023-08-07 MED ORDER — METOPROLOL TARTRATE 25 MG PO TABS
25.0000 mg | ORAL_TABLET | Freq: Two times a day (BID) | ORAL | Status: DC
  Administered 2023-08-07 – 2023-08-09 (×5): 25 mg via ORAL
  Filled 2023-08-07 (×5): qty 1

## 2023-08-07 MED ORDER — LACTATED RINGERS IV SOLN: INTRAVENOUS | Status: DC

## 2023-08-07 MED ORDER — ALBUMIN HUMAN 25 % IV SOLN
75.0000 g | Freq: Every day | INTRAVENOUS | Status: DC
  Administered 2023-08-07 – 2023-08-08 (×2): 75 g via INTRAVENOUS
  Filled 2023-08-07 (×3): qty 300

## 2023-08-07 MED ORDER — MAGNESIUM SULFATE IN D5W 1-5 GM/100ML-% IV SOLN
1.0000 g | Freq: Once | INTRAVENOUS | Status: AC
  Administered 2023-08-07: 1 g via INTRAVENOUS
  Filled 2023-08-07: qty 100

## 2023-08-07 MED ORDER — MIDODRINE HCL 5 MG PO TABS
5.0000 mg | ORAL_TABLET | Freq: Three times a day (TID) | ORAL | Status: DC
  Administered 2023-08-07 – 2023-08-09 (×6): 5 mg via ORAL
  Filled 2023-08-07 (×6): qty 1

## 2023-08-07 MED ORDER — FUROSEMIDE 10 MG/ML IJ SOLN
20.0000 mg | Freq: Once | INTRAMUSCULAR | Status: AC
  Administered 2023-08-07: 20 mg via INTRAVENOUS
  Filled 2023-08-07: qty 2

## 2023-08-07 MED ORDER — ALBUMIN HUMAN 25 % IV SOLN: 50.0000 g | Freq: Once | INTRAVENOUS | Status: DC

## 2023-08-07 NOTE — Consult Note (Signed)
Palliative Medicine Inpatient Consult Note  Consulting Provider:  Leroy Sea, MD   Reason for consult:   Palliative Care Consult Services Palliative Medicine Consult  Reason for Consult? NASH with now hepatorenal syndrome, goals of care   08/07/2023  HPI:  Per intake H&P --> Dana Dyer is a 72 y.o. female with medical history significant of Dana Dyer cirrhosis, chronic ascites, hypertension, hepatic encephalopathy, GERD, multiple cystic pancreatic lesion, hemorrhoid s/p banding, insulin dependent DM type II and generalized anxiety disorder.  Palliative care has been asked to get involved in the setting of hepatorenal syndrome to further discuss goals of care.  Clinical Assessment/Goals of Care:  *Please note that this is a verbal dictation therefore any spelling or grammatical errors are due to the "Dragon Medical One" system interpretation.  I have reviewed medical records including EPIC notes, labs and imaging, received report from bedside RN, assessed the patient who is lying in bed alert to place and self though not clear on situation until this was further reviewed with her which she then remembered.    I met with patient's daughter, Dana Dyer and patient, Dana Dyer to further discuss diagnosis prognosis, GOC, EOL wishes, disposition and options.   I introduced Palliative Medicine as specialized medical care for people living with serious illness. It focuses on providing relief from the symptoms and stress of a serious illness. The goal is to improve quality of life for both the patient and the family.  Medical History Review and Understanding:  Discussed Jameses past medical history significant for GERD, hypertension, Dana Dyer type cirrhosis, hemorrhoids, type 2 diabetes, and anxiety.  Social History:  Dana Dyer is originally from [place] though has lived in [place] the majority of her life.  She has been married to her husband since 1968 they share 2 children and 3 grandchildren.   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  She is a woman of faith and practices within the [facility] denomination.  Functional and Nutritional State:  Preceding hospitalization Dana Dyer has been living in a single-family home with her husband they are both retired.  She has both a cane and walker at home though uses neither.  Per her daughter she has suffered multiple falls in the home and does have unsteady gait.  She endorses a fair appetite.  Advance Directives:  A detailed discussion was had today regarding advanced directives.  Dana Dyer does have advanced directives a copy was brought in by her daughter and has been scanned into the electronic medical record system.  Code Status:  Concepts specific to code status, artifical feeding and hydration, continued IV antibiotics and rehospitalization was had.  The difference between a aggressive medical intervention path  and a palliative comfort care path for this patient at this time was had.   A MOST form was provided for review and completion.  Per Dr. Doristine Church discussion with Dana Dyer and her daughter the patient has been made a DO NOT RESUSCITATE CODE STATUS as of this morning.  Discussion:  We discussed Dana Dyer's underlying disease burden in the setting of her cirrhosis and now impaired renal function.  We discussed the delicate balance and the relationship between the liver and the kidneys.  We reviewed the concerns should the liver failure progress and/or the kidneys lack to the improvement.  Patient's daughter is aware that Dana Dyer is not a good candidate for any type of aggressive intervention such as dialysis.  Dana Dyer and I discussed the importance  of considering her quality of life with any interventions which are pursued.  She shares with me that quality to her is the ability to function with the degree of independence as well as enjoy time with her family and engage in meaningful  interactions.  We discussed the plan for the day inclusive of paracentesis as well as terlipressin initiation per the gastroenterology team.  We did discuss best case and worst-case scenarios and the chronic disease trajectory.  I shared I start to get concerned when patients have recurrent rehospitalization's and the quality of life both functionally and nutritionally deteriorates.  We reviewed that at that point is very worthwhile to consider hospice care.  The differences between palliative support and hospice care were described as below:  Palliative care is specialized medical care for people living with a serious illness, such as cancer, heart failure, COPD, Alzheimer's dementia, etc. Patients in palliative care may receive medical care for their symptoms, or palliative care, along with treatment intended to cure their serious illness   Hospice care focuses on the care, comfort, and quality of life of a person with a serious illness who is approaching the end of life. At some point, it may not be possible to cure a serious illness, or a patient may choose not to undergo certain treatments. Hospice is designed for this situation.  For the time being Dana Dyer would like to continue present treatments to see if she can improve.  She is open to outpatient palliative support.  She and her family plan to speak more about her wishes moving forward as well as completing the MOST form.  Both patient and her daughter are aware that her condition could deteriorate rather rapidly.  If hospice is needed in the future the goal will be for inpatient support.  Discussed the importance of continued conversation with family and their  medical providers regarding overall plan of care and treatment options, ensuring decisions are within the context of the patients values and GOCs.  Decision Maker: Dana Dyer (Spouse): [phone] (Mobile)   SUMMARY OF RECOMMENDATIONS   DNAR/DNI  Copy of patient's advanced  directives have been obtained and will be scanned into the electronic medical record  Open and honest conversations held in the setting of patient's acute on chronic disease burden  At this time patient would like to continue current modalities of care though is excepting that she may continue to have complications and hospital readmissions  Outpatient palliative care and hospice were discussed at this time patient is interested in outpatient palliative support  Appreciate social worker helping daughter navigate additional caregiver resources  The PMT will continue to follow along during hospital admission  Code Status/Advance Care Planning: DNAR/DNI   Palliative Prophylaxis:  Aspiration, Bowel Regimen, Delirium Protocol, Frequent Pain Assessment, Oral Care, Palliative Wound Care, and Turn Reposition  Additional Recommendations (Limitations, Scope, Preferences): Continue current care  Psycho-social/Spiritual:  Desire for further Chaplaincy support: Yes patient is [facility] Additional Recommendations: Education on chronic disease trajectory   Prognosis: High 45-month mortality risk  Discharge Planning: Discharge plan pending PT and OT evaluations  Vitals:   08/07/23 0930 08/07/23 1300  BP: (!) 149/61   Pulse: (!) 125   Resp: (!) 22   Temp:    SpO2: 96% 98%    Intake/Output Summary (Last 24 hours) at 08/07/2023 1306 Last data filed at 08/07/2023 0700 Gross per 24 hour  Intake 1000.21 ml  Output 300 ml  Net 700.21 ml   Last Weight  Most recent  update: 08/07/2023  5:21 AM    Weight  68.4 kg (150 lb 12.7 oz)            Gen: Elderly female chronically ill in appearance HEENT: moist mucous membranes CV: Irregular rate and rhythm  PULM: On RA, breathing slightly labored ABD: Distended EXT: (+) edema  Neuro: Alert and oriented x2-3  PPS: 50%   This conversation/these recommendations were discussed with patient primary care team, Dr. Thedore Mins  Billing based on MDM:  High  Problems Addressed: One acute or chronic illness or injury that poses a threat to life or bodily function  Amount and/or Complexity of Data: Category 3:Discussion of management or test interpretation with external physician/other qualified health care professional/appropriate source (not separately reported)  Risks: Decision regarding hospitalization or escalation of hospital care and Decision not to resuscitate or to de-escalate care because of poor prognosis ______________________________________________________ Lamarr Lulas [facility] Palliative Medicine Team Team Cell Phone: [phone] Please utilize secure chat with additional questions, if there is no response within 30 minutes please call the above phone number  Palliative Medicine Team providers are available by phone from 7am to 7pm daily and can be reached through the team cell phone.  Should this patient require assistance outside of these hours, please call the patient's attending physician.

## 2023-08-07 NOTE — Progress Notes (Signed)
PROGRESS NOTE                                                                                                                                                                                                             Patient Demographics:    Dana Dyer, is a 72 y.o. female, DOB - 10-Mar-1951, ZOX:[id number]  Outpatient Primary MD for the patient is Karie Georges, MD    LOS - 1  Admit date - 07/16/2023    Chief Complaint  Patient presents with   Altered Mental Status       Brief Narrative (HPI from H&P)    72 y.o. female with medical history significant of Elita Boone cirrhosis, chronic ascites, hypertension, hepatic encephalopathy, GERD, multiple cystic pancreatic lesion, GERD, hemorrhoid s/p banding, insulin dependent DM type II and generalized anxiety disorder presented to emergency department for evaluation for altered mental status.  Further workup showed that she was severely dehydrated >>  AKI >> relative insulin overdose due to AKI,  hypoglycemia and hypothermia.  She was admitted for metabolic encephalopathy caused by combination of AKI, dehydration, hypoglycemia and mildly elevated ammonia levels.   Subjective:    Dana Dyer today has, No headache, No chest pain, No abdominal pain - No Nausea, No new weakness tingling or numbness, no SOB   Assessment  & Plan :    Severe dehydration causing AKI, due to AKI insulin buildup causing hypoglycemia, dehydration and hypoperfusion induced hypothermia.  Is being hydrated as tolerated by her lungs and pulmonary edema due to third spacing, glucagon was used to reverse insulin, advancing oral diet, mental status improving, hold long-acting insulin for now.  Supportive care CBGs and temperature has improved, stable TSH, A1c and random cortisol.  Hypoglycemia has improved.  AKI with metabolic acidosis, question early hepatorenal syndrome.  Still clinically dehydrated but due  to protein calorie malnutrition caused by NASH IV fluids are getting third spaced and accumulating in her lungs, have to balance between diuretics, routine supplements, IV fluids and albumin, tenuous.  Will consult nephrology, palliative care for long-term goals of care and continue to monitor.  Metabolic encephalopathy due to combination of severe dehydration, AKI, hypoglycemia and mildly elevated ammonia/hepatic encephalopathy.  Continue lactulose and Xifaxan, hydrate, supportive care as above.  No headache or focal deficits.  Mental status already improving.  History of left  liver lobe hypodensity and multiple pancreatic cysts.  Following with [facility].  Due for outpatient MRCP on 08/20/2023.  History of dysphagia.  SLP follow-up.  NASH - continue lactulose along with Xifaxan, bedside ultrasound 08/06/2023 was unimpressive for fluid/site es per IR, abdomen more distended on 08/07/2023, repeat abdominal ultrasound, will for now give her Rocephin daily x 5 days empirically.  Consulted [facility].  GERD - PPI  Anemia of chronic disease.  Stable monitor expect some fall due to heme dilution.  DM type II.  Currently hypoglycemic, stable A1c, hold Lantus, sliding scale insulin.  Lab Results  Component Value Date   HGBA1C 6.3 (H) 08/06/2023   CBG (last 3)  Recent Labs    08/07/23 0006 08/07/23 0433 08/07/23 0733  GLUCAP 196* 158* 137*    Lab Results  Component Value Date   TSH 1.321 08/06/2023        Condition - Extremely Guarded  Family Communication  :   Daughter Lucy Chris [phone] - 08/07/23  Husband Rayna Sexton - [phone]  Female relative 08/06/2023  Code Status : Full code  Consults  : [facility], nephrology, palliative care  PUD Prophylaxis : PPI   Procedures  :     Abd [place] -  Small to moderate free fluid, significantly decreased since prior study      Disposition Plan  :    Status is: Inpatient   DVT Prophylaxis  :    heparin injection 5,000 Units  Start: 08/06/23 0600 SCDs Start: 08/06/23 0123    Lab Results  Component Value Date   PLT 83 (L) 08/07/2023    Diet :  Diet Order             DIET SOFT Fluid consistency: Thin  Diet effective now                    Inpatient Medications  Scheduled Meds:  furosemide  20 mg Intravenous Once   heparin  5,000 Units Subcutaneous Q8H   insulin aspart  0-5 Units Subcutaneous QHS   insulin aspart  0-9 Units Subcutaneous TID WC   lactulose  30 g Oral TID   metoprolol tartrate  25 mg Oral BID   pantoprazole  40 mg Oral Daily   rifaximin  550 mg Oral BID   Continuous Infusions:  cefTRIAXone (ROCEPHIN)  IV Stopped (08/06/23 1518)   PRN Meds:.acetaminophen **OR** acetaminophen, dextrose, metoprolol tartrate, [DISCONTINUED] ondansetron **OR** ondansetron (ZOFRAN) IV, mouth rinse, senna-docusate  Antibiotics  :    Anti-infectives (From admission, onward)    Start     Dose/Rate Route Frequency Ordered Stop   08/06/23 1130  cefTRIAXone (ROCEPHIN) 1 g in sodium chloride 0.9 % 100 mL IVPB        1 g 200 mL/hr over 30 Minutes Intravenous Every 24 hours 08/06/23 1038 08/11/23 1129   08/06/23 1000  rifaximin (XIFAXAN) tablet 550 mg        550 mg Oral 2 times daily 08/06/23 0122           Objective:   Vitals:   08/07/23 0500 08/07/23 0600 08/07/23 0609 08/07/23 0731  BP: 130/61 (!) 118/57  122/69  Pulse: (!) 122 (!) 140 (!) 124 (!) 122  Resp: (!) 22 (!) 26 (!) 22 20  Temp:    98.5 F (36.9 C)  TempSrc:    Oral  SpO2: 96% 96% 96% 97%  Weight: 68.4 kg     Height:  Wt Readings from Last 3 Encounters:  08/07/23 68.4 kg  07/13/23 74.2 kg  07/10/23 72.1 kg     Intake/Output Summary (Last 24 hours) at 08/07/2023 1014 Last data filed at 08/07/2023 0700 Gross per 24 hour  Intake 1000.21 ml  Output 300 ml  Net 700.21 ml     Physical Exam  Awake , confused,  No new F.N deficits, Normal affect [place].AT,PERRAL Supple Neck, No JVD,   Symmetrical Chest wall  movement, Good air movement bilaterally, CTAB RRR,No Gallops,Rubs or new Murmurs,  +ve B.Sounds, Abd Soft, No tenderness,   No Cyanosis, Clubbing or edema       Data Review:    Recent Labs  Lab 07/15/2023 2156 08/06/23 0300 08/07/23 0329  WBC 10.3 6.7 5.0  HGB 9.6* 8.5* 8.9*  HCT 30.4* 26.6* 27.2*  PLT 110* 84* 83*  MCV 83.3 82.6 82.7  MCH 26.3 26.4 27.1  MCHC 31.6 32.0 32.7  RDW 17.1* 17.2* 17.2*  LYMPHSABS 0.9  --  0.6*  MONOABS 1.3*  --  0.6  EOSABS 0.3  --  0.2  BASOSABS 0.1  --  0.0    Recent Labs  Lab 07/25/2023 2156 07/21/2023 2207 07/20/2023 2244 08/06/23 0010 08/06/23 0300 08/06/23 0546 08/06/23 0755 08/06/23 1017 08/07/23 0329  NA 132*  --   --   --  135  --   --   --  134*  K 3.6  --   --   --  3.1*  --   --   --  3.9  CL 100  --   --   --  103  --   --   --  100  CO2 16*  --   --   --  16*  --   --   --  19*  ANIONGAP 16*  --   --   --  16*  --   --   --  15  GLUCOSE 83  --   --   --  161*  --  62*  --  171*  BUN 49*  --   --   --  49*  --   --   --  44*  CREATININE 2.26*  --   --   --  2.06*  --   --   --  2.09*  AST 65*  --   --   --  53*  --   --   --  54*  ALT 77*  --   --   --  63*  --   --   --  60*  ALKPHOS 120  --   --   --  101  --   --   --  106  BILITOT 2.1*  --   --   --  2.2*  --   --   --  2.4*  ALBUMIN 2.6*  --   --   --  2.2*  --   --   --  3.2*  CRP  --   --   --   --   --   --   --  2.0* 2.0*  PROCALCITON  --   --   --   --   --  0.15  --   --  0.88  LATICACIDVEN  --   --  1.7  --   --   --   --   --   --   INR  --   --   --  1.2  --   --   --   --   --   TSH  --   --   --   --   --  1.321  --   --   --   HGBA1C  --   --   --   --   --  6.3*  --   --   --   AMMONIA  --  49*  --   --   --  69*  --   --   --   BNP  --   --   --   --   --  70.9  --   --  260.5*  MG  --   --   --   --   --  1.7  --   --  1.7  CALCIUM 8.7*  --   --   --  8.0*  --   --   --  8.6*      Recent Labs  Lab 07/10/2023 2156 07/13/2023 2207 07/10/2023 2244  08/06/23 0010 08/06/23 0300 08/06/23 0546 08/06/23 1017 08/07/23 0329  CRP  --   --   --   --   --   --  2.0* 2.0*  PROCALCITON  --   --   --   --   --  0.15  --  0.88  LATICACIDVEN  --   --  1.7  --   --   --   --   --   INR  --   --   --  1.2  --   --   --   --   TSH  --   --   --   --   --  1.321  --   --   HGBA1C  --   --   --   --   --  6.3*  --   --   AMMONIA  --  49*  --   --   --  69*  --   --   BNP  --   --   --   --   --  70.9  --  260.5*  MG  --   --   --   --   --  1.7  --  1.7  CALCIUM 8.7*  --   --   --  8.0*  --   --  8.6*    --------------------------------------------------------------------------------------------------------------- Lab Results  Component Value Date   CHOL 154 09/30/2021   HDL 59.30 09/30/2021   LDLCALC 69 09/30/2021   LDLDIRECT 124.9 10/14/2010   TRIG 128.0 09/30/2021   CHOLHDL 3 09/30/2021    Lab Results  Component Value Date   HGBA1C 6.3 (H) 08/06/2023   Recent Labs    08/06/23 0546  TSH 1.321   No results for input(s): "VITAMINB12", "FOLATE", "FERRITIN", "TIBC", "IRON", "RETICCTPCT" in the last 72 hours. ------------------------------------------------------------------------------------------------------------------ Cardiac Enzymes No results for input(s): "CKMB", "TROPONINI", "MYOGLOBIN" in the last 168 hours.  Invalid input(s): "CK"  Micro Results Recent Results (from the past 240 hour(s))  Culture, body fluid w Gram Stain-bottle     Status: None   Collection Time: 08/01/23 11:05 AM   Specimen: Peritoneal Washings  Result Value Ref Range Status   Specimen Description PERITONEAL  Final   Special Requests NONE  Final   Culture   Final    NO GROWTH 5 DAYS Performed at [facility], [address] [place], [place] [address]    Report Status 08/06/2023 FINAL  Final  Gram stain     Status: None   Collection Time: 08/01/23 11:05 AM   Specimen: Peritoneal Washings  Result Value Ref Range Status   Specimen  Description PERITONEAL  Final   Special Requests NONE  Final   Gram Stain   Final    NO WBC SEEN NO ORGANISMS SEEN Performed at [facility], [address], [place], [place] [address]    Report Status 08/01/2023 FINAL  Final  Culture, blood (Routine X 2) w Reflex to ID Panel     Status: None (Preliminary result)   Collection Time: 08/06/23  2:50 AM   Specimen: BLOOD  Result Value Ref Range Status   Specimen Description BLOOD RIGHT ANTECUBITAL  Final   Special Requests   Final    BOTTLES DRAWN AEROBIC AND ANAEROBIC Blood Culture results may not be optimal due to an excessive volume of blood received in culture bottles   Culture   Final    NO GROWTH 1 DAY Performed at [facility], [address], [place], [place] [address]    Report Status PENDING  Incomplete  Culture, blood (Routine X 2) w Reflex to ID Panel     Status: None (Preliminary result)   Collection Time: 08/06/23  3:00 AM   Specimen: BLOOD RIGHT HAND  Result Value Ref Range Status   Specimen Description BLOOD RIGHT HAND  Final   Special Requests   Final    BOTTLES DRAWN AEROBIC AND ANAEROBIC Blood Culture adequate volume   Culture   Final    NO GROWTH 1 DAY Performed at [facility], [address], [place], [place] [address]    Report Status PENDING  Incomplete    Radiology Reports DG Chest Port 1 View  Result Date: 08/07/2023 CLINICAL DATA:  72 year old female with history of shortness of breath. EXAM: PORTABLE CHEST 1 VIEW COMPARISON:  Chest x-ray 07/28/2023. FINDINGS: Chronic elevation of the right hemidiaphragm is unchanged. No acute consolidative airspace disease. However, there is widespread interstitial prominence and patchy peribronchial cuffing scattered throughout the lungs bilaterally. No pleural effusions. No pneumothorax. No evidence of pulmonary edema. Heart size is normal. Upper mediastinal contours are within normal limits. IMPRESSION: 1. Widespread interstitial prominence and  peribronchial cuffing concerning for potential acute bronchitis. 2. Chronic elevation of the right hemidiaphragm is unchanged. Electronically Signed   By: Trudie Reed M.D.   On: 08/07/2023 07:09   US Abdomen Limited  Result Date: 08/06/2023 CLINICAL DATA:  Ascites EXAM: LIMITED ABDOMEN ULTRASOUND FOR ASCITES TECHNIQUE: Limited ultrasound survey for ascites was performed in all four abdominal quadrants. COMPARISON:  07/21/2023 FINDINGS: Small to moderate free fluid in the abdomen and pelvis, most notable in the right upper quadrant. This is significantly smaller than prior study. IMPRESSION: Small to moderate free fluid, significantly decreased since prior study. Electronically Signed   By: Charlett Nose M.D.   On: 08/06/2023 02:06   DG Chest Port 1 View  Result Date: 08/06/2023 CLINICAL DATA:  Altered mental status EXAM: PORTABLE CHEST 1 VIEW COMPARISON:  07/02/2023 FINDINGS: Mild elevation of the right hemidiaphragm. Heart and mediastinal contours are within normal limits. No focal opacities or effusions. No acute bony abnormality. IMPRESSION: No active disease. Electronically Signed   By: Charlett Nose M.D.   On: 08/06/2023 00:01      Signature  -   Susa Raring M.D on 08/07/2023 at 10:14 AM   -  To page go to [email or web address]

## 2023-08-07 NOTE — Consult Note (Addendum)
Attending physician's note   I have taken a history, reviewed the chart, and examined the patient. I performed a substantive portion of this encounter, including complete performance of at least one of the key components, in conjunction with the APP. I agree with the APP's note, impression, and recommendations with my edits.   Dana Dyer is a 72 year old female with medical history as outlined below, well-known to me from the outpatient clinic for NASH cirrhosis c/b hepatic encephalopathy, ascites, hyponatremia, esophageal varices, portal hypertensive gastropathy.  Historically, we have had difficulty managing her diuretics due to hyponatremia and AKI, but with the assistance of Nephrology as an outpatient, we were able to slowly uptitrate those diuretics to Lasix 40 mg and spironolactone 100 mg daily as an outpatient.  Was doing well on that regimen from a kidney standpoint, but unfortunately had some reaccumulation of ascites over the last month or so, requiring low volume paracentesis x 2.    Her daughter relates that she then developed nausea/vomiting/diarrhea and AMS which prompted her to bring her to the ER.    Additionally, her hepatic encephalopathy has been increasingly more difficult to manage due to the diarrhea from lactulose, which has resulted in multiple hospitalizations, including admission in July for HE.  I had an in-depth conversation with the patient along with her daughter at bedside.  We have previously discussed her progressive, stepwise decline as an outpatient, and I believe this hospitalization represents a continued overall decline.  Her daughter is very insightful and remains wonderfully involved in Dana Dyer's health.  I discussed her current renal dysfunction and elevated concern for HRS, and certainly sounds like she has been doing her own reading and understands the gravity of the situation well.  1) Decompensated NASH Cirrhosis 2) Hepatic encephalopathy 3) AKI 4)  Hyponatremia High clinical suspicion for HRS.  Continued renal dysfunction despite starting midodrine and IV albumin on admission. - Nephrology consult - Continue max supportive care as currently doing - Continue lactulose/rifaximin - As above, I had an in-depth conversation with the patient and her daughter at bedside.  I discussed the overall poor prognosis and discussed the idea of Palliative Care consultation.  Her daughter would very much like to explore that conversation.  She does relay that her father (patient's husband) was recently discharged from the hospital as well and she is trying to care for each of her parents, in conjunction with her brother  Patria Mane [phone] office          Consultation  Referring Provider:  [facility]  Primary Care Physician:  Karie Georges, MD Primary Gastroenterologist:  Dr. Barron Alvine       Reason for Consultation:     Decompensated Nash cirrhosis  LOS: 1 day          HPI:   YULIET POIROT is a 72 y.o. female with past medical history significant for Elita Boone cirrhosis from longstanding fatty liver disease (diagnosed 2019), chronic ascites, hepatic encephalopathy, multiple cystic pancreatic lesion, insulin-dependent DM type II, presents for evaluation of decompensated Nash cirrhosis.  Recent admission late July for hepatic encephalopathy with ammonia of 103.  CT C/A/P showing cirrhotic liver with portal hypertension, splenomegaly, ascites, multiple varices with new 1.2cm hypodensity in left lower lobe.  Pancreatic cysts.  Patient was discharged on lactulose and rifaximin.  Scheduled for outpatient MRCP.  Recently seen in office by Dr. Barron Alvine 07/10/2023 for hospital follow-up.  At that time patient was recommended to continue lactulose, Lasix 20  Mg twice daily, Aldactone 50 Mg twice daily, metoprolol, and rifaximin.  Patient presented to ED for altered mental status.  Patient has had decreased p.o. intake and has not been taking  medications secondary to nausea/vomiting.  Patient has therapeutic paracentesis every 2 weeks with last paracentesis done 8/27 with fluid analysis negative for SBP.  Patient was admitted for metabolic encephalopathy from a combination of AKI, dehydration, hypoglycemia, mildly elevated and ammonia levels.   Pertinent workup - Chest x-ray unremarkable - Initial ammonia 69 - Hgb 8.9, stable - Platelets 83 - CRP 2.0 - AST 54/ALT 60/alk phos 106 - Total bilirubin 2.4 - PT 15.7, INR 1.2 - BUN 44, creatinine 2.09, GFR 15.  Kidney function 07/05/2023 was adequate with creatinine 0.84 -Albumin 3.2 (improved from 2.2) - MELD 3.0: 23 - Meld-NA: 21 - US abdomen shows small to moderate free fluid - Repeat ultrasound today (9/2) is pending  ED course: Initial presentation with heart rate 100, BP 119/70.  Ammonia level 49.  Lactic acid 1.7. PT 15.7, INR 1.7.  Patient was given NS 1L bolus   PREVIOUS GI WORKUP   ENDOSCOPIC WORKUP  Colonoscopy 03/2022: hemorrhoids. 2 subcentimeter adenomas.  A 1 cm ulcer in the cecum was noted this did not appear malignant.  Biopsies showed no neoplasm, suspect from meloxicam she was taken for back pains.  Previous tattoo of colon area was normal. Recommended 5 year repeat.  S/p banding RP hemorrhoid 4/24 and LL hemorrhoid 5.2024 EGD 03/2022 small esophagus varices, mild portal gastropathy.  Biopsies of minimal gastritis showed no H. pylori  Colonoscopy January 2020 four polyps were removed from her colon, all were adenomatous on pathology.  She was recommended to have repeat colonoscopy at 3-year interval.   EGD 12/2018 small distal esophagus varices, mild portal gastropathy, no gastric varices.  She was started on nadolol (eventually at 40mg  daily).  Colonoscopy Dr. Christella Hartigan December 2016 found 7 subcentimeter polyps.  These were all adenomatous polyps on pathology.  Also documented left-sided diverticulosis.    CIRRHOSIS HX AND WORKUP: - Lab workup 2019: Hep C Ab  negative, Hep B Surface Ag negative, hepatitis B surface antibody negative, hep B Core Igm negative, Hep A IgM negative. Ferritin normal, HIV negative, ANA negative, antimitochondrial antibody negative, hepatitis a antibody total negative; slightly low platelets, transaminases slightly elevated.  No edema. - [place] 07/2018: slightly nodular, suspicious for cirrhosis. - [place] 12/2019 cirrhosis without mass lesions. - Ultrasound 08/2020 showed cirrhosis without focal mass lesions. Gallbladder thick but without Murphy sign. - Ultrasound May 2022 showed cirrhosis, trace perihepatic ascites, no focal lesions in the liver. - [place] 12/2021 nodular liver, no masses.  - EGD 12/2018 small distal esophagus varices, mild portal gastropathy, no gastric varices.  She was started on nadolol (eventually at 40mg  daily). - EGD 03/2022 small esophagus varices, mild portal gastropathy.  Biopsies of minimal gastritis showed no H. pylori - AFP 11/2021 normal - Hepatitis A, B vaccination series started 11/2018 -11/2021: Mild encephalopathy, memory disturbance, slight tremors, ammonia 65. Started lactulose which might have raised her blood sugars a bit. -07/29/2022 - 08/01/2022: Hospital admission for hyponatremia 2/2 hypovolemia.  NA 120 on admission, improved to 130s by DC.  Discharged home with salt tablets to hold diuretics (was actually diuretics prior to hospitalization) -07/29/2022: CT A/P: Cirrhotic appearing liver, no HCC.  Evidence of portal hypertension (varices, splenorenal shunt, small volume ascites, mild splenomegaly), multiple cystic lesions in the uncinate process of the pancreas measuring up to 4.1 cm favoring sidebranch  IPMN's.  Patient was not found to be a candidate for EUS based on hyponatremia and recommended MRI repeat in 3 months - 08/15/2022: Abdominal ultrasound: New small to moderate ascites, GB wall thickening related to ascites/hepatic dysfunction.  Cirrhosis without HCC.  Restart Aldactone 25 mg qod and Lasix 10 mg  qod (to take on alternating days).  Placed referral to Nephrology for medication intolerant ascites - 04/19/2023: BUN/creatinine 31/1.23.  Decreased Lasix/Aldactone to 10 mg / 25 mg with good serologic response.  Ammonia was 121 despite lactulose/rifaximin; attributed to renal dysfunction.  Past Medical History:  Diagnosis Date   ALLERGIC RHINITIS 10/09/2007   Allergy    Arthritis    hips, spine   DIABETES MELLITUS, UNCONTROLLED 03/16/2009   FATIGUE 10/21/2008   FIBROMYALGIA 10/09/2007   GERD (gastroesophageal reflux disease)    diet  controlled, no meds   H/O vaginal hysterectomy    HYPERLIPIDEMIA 10/09/2007   diet controlled, no meds   HYPERTENSION 10/09/2007   Morton's neuroma    OBSTRUCTIVE SLEEP APNEA 10/09/2007   Palpitations    Plantar fasciitis    History - right   Sleep apnea    Uses CPAP   TRANSAMINASES, SERUM, ELEVATED 07/08/2009    Surgical History:  She  has a past surgical history that includes Tonsillectomy (1959); mamoplasty; Excision basal cell carcinoma; Abdominal hysterectomy (1979); Tubal ligation (1976); Colonoscopy (2006); Breast biopsy (Left, 2012); Breast reduction surgery (Bilateral, 1992); Reduction mammaplasty (Bilateral, 1992); and IR Paracentesis (08/01/2023). Family History:  Her family history includes Alcohol abuse in her father; Arthritis in her father and mother; Breast cancer in her maternal aunt; Breast cancer (age of onset: 11) in her mother; Diabetes in her mother; Hypertension in her maternal grandfather; Miscarriages / [place] in her paternal grandfather. Social History:   reports that she has never smoked. She has never used smokeless tobacco. She reports that she does not drink alcohol and does not use drugs.  Prior to Admission medications   Medication Sig Start Date End Date Taking? Authorizing Provider  furosemide (LASIX) 20 MG tablet Take 1-2 tablets (20-40 mg total) by mouth 2 (two) times daily. Hold if wt. Below 149 lbs. 07/13/23   Yes Karie Georges, MD  hydrocortisone (ANUSOL-HC) 25 MG suppository Place 1 suppository (25 mg total) rectally at bedtime. 07/11/23  Yes Yael Coppess V, DO  insulin glargine (LANTUS SOLOSTAR) 100 UNIT/ML Solostar Pen Inject 26 Units into the skin daily. 06/15/23  Yes Shamleffer, Konrad Dolores, MD  insulin lispro (HUMALOG KWIKPEN) 200 UNIT/ML KwikPen Max daily 60 units 06/15/23  Yes Shamleffer, Konrad Dolores, MD  lactulose (CHRONULAC) 10 GM/15ML solution 3 tablespoons for breakfast, 2 tablespoons with lunch and dinner 07/05/23  Yes [place], Eric J, DO  metFORMIN (GLUCOPHAGE-XR) 750 MG 24 hr tablet Take 1 tablet (750 mg total) by mouth 2 (two) times daily. 06/15/23  Yes Shamleffer, Konrad Dolores, MD  metoprolol tartrate (LOPRESSOR) 25 MG tablet Take 1 tablet (25 mg total) by mouth 2 (two) times daily. 07/17/23  Yes Nahser, Deloris Ping, MD  mirtazapine (REMERON SOL-TAB) 15 MG disintegrating tablet Start with 1/2 tablet daily at bedtime for 1 week, then increase to 1 tablet daily at bedtime. 06/27/23  Yes Karie Georges, MD  omeprazole (PRILOSEC) 20 MG capsule Take 20 mg by mouth daily as needed (heartburn).   Yes [provider]  rifaximin (XIFAXAN) 550 MG TABS tablet Take 1 tablet (550 mg total) by mouth 2 (two) times daily. 01/18/23  Yes Khloee Garza, Verlin Dike, DO  spironolactone (ALDACTONE) 50 MG tablet Take 1-2 tablets (50-100 mg total) by mouth daily. 07/13/23  Yes Karie Georges, MD  clobetasol cream (TEMOVATE) 0.05 % Apply 1 Application topically 2 (two) times daily as needed (yeast infection).    [provider]  Continuous Blood Gluc Sensor (DEXCOM G7 SENSOR) MISC 1 Device by Does not apply route as directed. Every 10 days 05/09/22   Shamleffer, Konrad Dolores, MD  Insulin Pen Needle (BD PEN NEEDLE NANO 2ND GEN) 32G X 4 MM MISC 1 Device by Other route in the morning, at noon, in the evening, and at bedtime. 06/15/23   Shamleffer, Konrad Dolores, MD  ondansetron (ZOFRAN) 4 MG  tablet Take 1 tablet (4 mg total) by mouth every 6 (six) hours as needed for nausea or vomiting. 08/04/23   Iva Boop, MD  vitamin k 100 MCG tablet Take 100 mcg by mouth daily.    [provider]  zinc gluconate 50 MG tablet Take 1 tablet (50 mg total) by mouth daily. 10/20/22   Doree Albee, PA-C    Current Facility-Administered Medications  Medication Dose Route Frequency Provider Last Rate Last Admin   acetaminophen (TYLENOL) tablet 650 mg  650 mg Oral Q6H PRN Janalyn Shy, Subrina, MD       Or   acetaminophen (TYLENOL) suppository 650 mg  650 mg Rectal Q6H PRN Janalyn Shy, Subrina, MD       cefTRIAXone (ROCEPHIN) 1 g in sodium chloride 0.9 % 100 mL IVPB  1 g Intravenous Q24H Leroy Sea, MD   Stopped at 08/06/23 1518   dextrose 50 % solution 50 mL  1 ampule Intravenous Daily PRN Sundil, Subrina, MD       heparin injection 5,000 Units  5,000 Units Subcutaneous Q8H Sundil, Subrina, MD   5,000 Units at 08/07/23 0520   insulin aspart (novoLOG) injection 0-5 Units  0-5 Units Subcutaneous QHS Leroy Sea, MD       insulin aspart (novoLOG) injection 0-9 Units  0-9 Units Subcutaneous TID WC Leroy Sea, MD   1 Units at 08/07/23 1026   lactulose (CHRONULAC) 10 GM/15ML solution 30 g  30 g Oral TID Leroy Sea, MD   30 g at 08/07/23 1025   magnesium sulfate IVPB 1 g 100 mL  1 g Intravenous Once Leroy Sea, MD       metoprolol tartrate (LOPRESSOR) injection 5 mg  5 mg Intravenous Q6H PRN Hillary Bow, DO   5 mg at 08/07/23 0606   metoprolol tartrate (LOPRESSOR) tablet 25 mg  25 mg Oral BID Leroy Sea, MD   25 mg at 08/07/23 1030   midodrine (PROAMATINE) tablet 5 mg  5 mg Oral TID WC Leroy Sea, MD       ondansetron [facility]) injection 4 mg  4 mg Intravenous Q6H PRN Janalyn Shy, Subrina, MD       Oral care mouth rinse  15 mL Mouth Rinse PRN Leroy Sea, MD       pantoprazole (PROTONIX) EC tablet 40 mg  40 mg Oral Daily Sundil, Subrina, MD   40 mg  at 08/07/23 1029   rifaximin (XIFAXAN) tablet 550 mg  550 mg Oral BID Sundil, Subrina, MD   550 mg at 08/07/23 1029   senna-docusate (Senokot-S) tablet 1 tablet  1 tablet Oral QHS PRN Tereasa Coop, MD        Allergies as of 07/24/2023 - Review Complete 07/17/2023  Allergen Reaction Noted   Amoxicillin-pot  clavulanate  03/09/2012   Codeine sulfate  03/16/2009   Diflucan [fluconazole] Hives 03/16/2022   Epinephrine Other (See Comments) 07/30/2022   Erythromycin base Diarrhea and Other (See Comments) 04/07/2009   Invokana [canagliflozin] Other (See Comments) 05/18/2015   Irbesartan-hydrochlorothiazide Other (See Comments) 04/07/2009   Victoza [liraglutide] Nausea Only 03/10/2016   Latex Rash    Ozempic (0.25 or 0.5 mg-dose) [semaglutide(0.25 or 0.5mg -dos)] Nausea Only 09/09/2019    ROS negative other than what is in HPI    Physical Exam:  Vital signs in last 24 hours: Temp:  [98.1 F (36.7 C)-98.9 F (37.2 C)] 98.5 F (36.9 C) (09/02 0731) Pulse Rate:  [107-140] 125 (09/02 0930) Resp:  [17-26] 22 (09/02 0930) BP: (110-149)/(52-83) 149/61 (09/02 0930) SpO2:  [95 %-98 %] 96 % (09/02 0930) Weight:  [68.4 kg] 68.4 kg (09/02 0500) Last BM Date : 08/07/23 Last BM recorded by nurses in past 5 days Stool Type: Type 7 (Liquid consistency with no solid pieces) (08/07/2023 11:07 AM)  Physical Exam Constitutional:      Appearance: She is ill-appearing.  HENT:     Head: Normocephalic and atraumatic.     Nose: Nose normal. No congestion.     Mouth/Throat:     Mouth: Mucous membranes are dry.  Eyes:     General: No scleral icterus.    Extraocular Movements: Extraocular movements intact.  Pulmonary:     Comments: Audible wheezing, no respiratory distress Musculoskeletal:        General: No swelling. Normal range of motion.  Skin:    General: Skin is warm and dry.     Coloration: Skin is pale.  Neurological:     Mental Status: She is alert.     Comments: Asterixis on exam   Psychiatric:     Comments: Alert, somewhat confused      LAB RESULTS: Recent Labs    07/20/2023 2156 08/06/23 0300 08/07/23 0329  WBC 10.3 6.7 5.0  HGB 9.6* 8.5* 8.9*  HCT 30.4* 26.6* 27.2*  PLT 110* 84* 83*   BMET Recent Labs    07/11/2023 2156 08/06/23 0300 08/06/23 0755 08/07/23 0329  NA 132* 135  --  134*  K 3.6 3.1*  --  3.9  CL 100 103  --  100  CO2 16* 16*  --  19*  GLUCOSE 83 161* 62* 171*  BUN 49* 49*  --  44*  CREATININE 2.26* 2.06*  --  2.09*  CALCIUM 8.7* 8.0*  --  8.6*   LFT Recent Labs    08/07/23 0329  PROT 6.2*  ALBUMIN 3.2*  AST 54*  ALT 60*  ALKPHOS 106  BILITOT 2.4*   PT/INR Recent Labs    08/06/23 0010  LABPROT 15.7*  INR 1.2    STUDIES: DG Chest Port 1 View  Result Date: 08/07/2023 CLINICAL DATA:  72 year old female with history of shortness of breath. EXAM: PORTABLE CHEST 1 VIEW COMPARISON:  Chest x-ray 07/18/2023. FINDINGS: Chronic elevation of the right hemidiaphragm is unchanged. No acute consolidative airspace disease. However, there is widespread interstitial prominence and patchy peribronchial cuffing scattered throughout the lungs bilaterally. No pleural effusions. No pneumothorax. No evidence of pulmonary edema. Heart size is normal. Upper mediastinal contours are within normal limits. IMPRESSION: 1. Widespread interstitial prominence and peribronchial cuffing concerning for potential acute bronchitis. 2. Chronic elevation of the right hemidiaphragm is unchanged. Electronically Signed   By: Trudie Reed M.D.   On: 08/07/2023 07:09   US Abdomen Limited  Result Date:  08/06/2023 CLINICAL DATA:  Ascites EXAM: LIMITED ABDOMEN ULTRASOUND FOR ASCITES TECHNIQUE: Limited ultrasound survey for ascites was performed in all four abdominal quadrants. COMPARISON:  07/21/2023 FINDINGS: Small to moderate free fluid in the abdomen and pelvis, most notable in the right upper quadrant. This is significantly smaller than prior study. IMPRESSION:  Small to moderate free fluid, significantly decreased since prior study. Electronically Signed   By: Charlett Nose M.D.   On: 08/06/2023 02:06   DG Chest Port 1 View  Result Date: 08/06/2023 CLINICAL DATA:  Altered mental status EXAM: PORTABLE CHEST 1 VIEW COMPARISON:  07/02/2023 FINDINGS: Mild elevation of the right hemidiaphragm. Heart and mediastinal contours are within normal limits. No focal opacities or effusions. No acute bony abnormality. IMPRESSION: No active disease. Electronically Signed   By: Charlett Nose M.D.   On: 08/06/2023 00:01      Impression    72 year old female history of decompensated Elita Boone cirrhosis (diagnosed 2019) with chronic ascites receiving therapeutic paracentesis recently, hepatic encephalopathy on lactulose and rifaximin, presents for evaluation of AKI with questionable HRS and worsening hepatic encephalopathy after not taking medications due to nausea/vomiting and dehydration.  Decompensated Nash cirrhosis with ascites and encephalopathy. - Chest x-ray unremarkable - Initial ammonia 69 - Hgb 8.9, stable - Platelets 83 - CRP 2.0 - AST 54/ALT 60/alk phos 106 - Total bilirubin 2.4 - PT 15.7, INR 1.2 - MELD 3.0: 23 - Meld-NA: 21 - US abdomen shows small to moderate free fluid - Repeat ultrasound today (9/2) is pending - Last paracentesis outpatient 8/27 with negative fluid analysis Nephrology has been consulted.  Patient was given 1 dose of albumin 9/1 at 6 AM.  Patient is on prophylactic ceftriaxone 1G daily for 5 days.  Patient started midodrine 9/2 5 Mg 3 times daily with meals. Concern for further decompensation with HRS and third spacing.   AKI secondary to dehydration versus HRS - BUN 44, creatinine 2.09, GFR 15.  Kidney function 07/05/2023 was adequate with creatinine 0.84 -Albumin 3.2 (improved from 2.2)   Plan   - Continue to trend LFTs - Serial INR, daily - monitor daily MELD - consult to palliative care to discuss goals of care moving forward -  continue protonix 40mg  IV BID - continue metoprolol  ASCITES/WORSENING KIDNEY FUNCTION (HRS?)  - Avoid large volume paracentesis -Diuretics on hold -Avoid nephrotoxic drugs -1 g/kg per day of Albumin (max 100 g) for 2 days.  - Continue midodrine. Await further recommendations per nephrology if vasopressin is to be considered. appreciate Nephrology recommendations. - 2 g sodium diet - Continue ceftriaxone 1G daily for 5 days for PPX  HEPATIC ENCEPHALOPATHY - Lactulose, 30ml titrate to 3bms per day - Rifaximin 550mg  bid - minimize/remove all benzos and narcotics  Thank you for your kind consultation, we will continue to follow.   Bayley Leanna Sato  08/07/2023, 12:42 PM

## 2023-08-07 NOTE — TOC CM/SW Note (Signed)
Transition of Care [facility]) - Inpatient Brief Assessment   Patient Details  Name: Dana Dyer MRN: [id number] Date of Birth: August 16, 1951  Transition of Care [facility]) CM/SW Contact:    Tom-Johnson, Hershal Coria, RN Phone Number: 08/07/2023, 2:16 PM   Clinical Narrative:  CM spoke with patient and daughter, Shawna Orleans at bedside about needs for post hospital transition.  Presented to the ED with Altered mental Status, admitted with Acute Hepatic Encephalopathy.  Has hx of  Nash Cirrhosis, Chronic Ascites, hypertension, Multiple Cystic Pancreatic Lesion, GERD, hemorrhoid s/p banding and DM.  Patient goes for Outpatient Paracentesis every 2 wks.   From home with husband, has two supportive children. Independent with care and family assists as needed. Husband and children transports to appointments.  Has a walker at home. PCP is Karie Georges, MD and uses [facility] on [address].   Home health disciplines recommended, Shawna Orleans has no preference. Referral sent to [facility] and Kandee Keen noted acceptance, info on AVS.  RW, Wheelchair and BSC recommended, Shawna Orleans wants to hold off to see how patient progresses, states patient might not need.   Patient not Medically ready for discharge.  CM will continue to follow as patient progresses with care towards discharge.       Transition of Care Asessment: Insurance and Status: Insurance coverage has been reviewed Patient has primary care physician: Yes Home environment has been reviewed: Yes Prior level of function:: Independent Prior/Current Home Services: No current home services Social Determinants of Health Reivew: SDOH reviewed no interventions necessary Readmission risk has been reviewed: Yes Transition of care needs: transition of care needs identified, TOC will continue to follow

## 2023-08-07 NOTE — Progress Notes (Signed)
   Palliative Medicine Inpatient Follow Up Note  The PMT has acknowledged the consultation for Dana Dyer. Louischarles.  I have called patients primary contact - Lucy Chris (daughter) who has requested that she take today to speak to her father and brother.  Shawna Orleans shares she would like to meet with the PMT though her father had just recently left the hospital so would like to allow him some time to recover.  Shawna Orleans states she will call back with a meeting time, most likely for tomorrow.  No Charge ______________________________________________________________________________________ Lamarr Lulas [place] Palliative Medicine Team Team Cell Phone: [phone] Please utilize secure chat with additional questions, if there is no response within 30 minutes please call the above phone number  Palliative Medicine Team providers are available by phone from 7am to 7pm daily and can be reached through the team cell phone.  Should this patient require assistance outside of these hours, please call the patient's attending physician.

## 2023-08-07 NOTE — Evaluation (Signed)
Occupational Therapy Evaluation Patient Details Name: Dana Dyer MRN: [id number] DOB: Nov 12, 1951 Today's Date: 08/07/2023   History of Present Illness The pt is a 72 yo female presenting 8/31 with AMS. Pt found to be severely dehydrated causing AKI which led to insulin build up causing hypoglycemia and hypothermia. PMH includes: NASH cirrhosis, HTN, hepatic encephalopathy, GERD, DM II, and anxiety.   Clinical Impression   Pt admitted for above, PTA pt was independent in mobility and ADLs. Pt currently presents with impaired cognition and balance, needing Min A + UE support for ambulation/transfers. Due to slower processing and decreased initiation pt needs increased assist with ADLs. She sways L<>R during OOB mobility and is noted to be incontinent of stool at this time. Pt would benefit from continued acute skilled OT services to address deficits and help transition to next level of care. Patient would benefit from post acute Home OT services to help maximize functional independence in natural environment       If plan is discharge home, recommend the following: A little help with walking and/or transfers;A lot of help with bathing/dressing/bathroom;Assistance with cooking/housework;Direct supervision/assist for financial management;Help with stairs or ramp for entrance;Direct supervision/assist for medications management;Supervision due to cognitive status    Functional Status Assessment  Patient has had a recent decline in their functional status and demonstrates the ability to make significant improvements in function in a reasonable and predictable amount of time.  Equipment Recommendations  BSC/3in1    Recommendations for Other Services       Precautions / Restrictions Precautions Precautions: Fall Precaution Comments: watch HR Restrictions Weight Bearing Restrictions: No      Mobility Bed Mobility Overal bed mobility: Needs Assistance Bed Mobility: Supine to Sit      Supine to sit: Min assist, HOB elevated, Used rails     General bed mobility comments: Pt left sitting in recliner    Transfers Overall transfer level: Needs assistance Equipment used: None Transfers: Sit to/from Stand Sit to Stand: Contact guard assist           General transfer comment: Pt slow to rise, cues to position chest and head up for good transition into upright posture. Upon ambulating to recliner, pt needed tactile cue for hand placement on arm rest for safe transition to sitting      Balance Overall balance assessment: Needs assistance Sitting-balance support: No upper extremity supported, Feet supported Sitting balance-Leahy Scale: Fair     Standing balance support: Single extremity supported, During functional activity Standing balance-Leahy Scale: Poor Standing balance comment: Would benefit from UE support due to unsteadiness                           ADL either performed or assessed with clinical judgement   ADL Overall ADL's : Needs assistance/impaired Eating/Feeding: Set up;Bed level   Grooming: Standing;Contact guard assist   Upper Body Bathing: Sitting;Set up   Lower Body Bathing: Minimal assistance;Sitting/lateral leans   Upper Body Dressing : Sitting;Minimal assistance Upper Body Dressing Details (indicate cue type and reason): needed assist to process hand placement to down gown on backside Lower Body Dressing: Maximal assistance Lower Body Dressing Details (indicate cue type and reason): donning socks, pt did not begin to initiaiate the tasks but moved feet to make it easier to don Toilet Transfer: Minimal assistance;Ambulation   Toileting- Clothing Manipulation and Hygiene: Sit to/from stand;Total assistance Toileting - Clothing Manipulation Details (indicate cue type and reason): min A  needed to maintain balance,Total A for rear pericare     Functional mobility during ADLs: Minimal assistance General ADL Comments: Daughter  providing assist with guarding pt while OT performs rear pericare     Vision         Perception         Praxis         Pertinent Vitals/Pain Pain Assessment Pain Assessment: No/denies pain     Extremity/Trunk Assessment Upper Extremity Assessment Upper Extremity Assessment: Generalized weakness;Difficult to assess due to impaired cognition           Communication Communication Communication: No apparent difficulties   Cognition Arousal: Alert Behavior During Therapy: WFL for tasks assessed/performed Overall Cognitive Status: Impaired/Different from baseline Area of Impairment: Memory, Following commands, Safety/judgement, Problem solving                     Memory: Decreased short-term memory, Decreased recall of precautions Following Commands: Follows one step commands inconsistently, Follows one step commands with increased time Safety/Judgement: Decreased awareness of safety, Decreased awareness of deficits   Problem Solving: Slow processing, Decreased initiation, Difficulty sequencing, Requires verbal cues General Comments: Daughter confirming that this is not pt baseline, reports that "the bible" 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     General Comments  HR in 120s with functional mobility, upon entering hall pt noted to have loose stool dripping on floor and needed assist with cleaning. Pt wheezing during activity, not able to obtain Sp02 at the time due to nail polish.    Exercises     Shoulder Instructions      Home Living Family/patient expects to be discharged to:: Private residence Living Arrangements: Spouse/significant other Available Help at Discharge: Family;Available 24 hours/day Type of Home: House Home Access: Stairs to enter [employer] of Steps: 4 Entrance Stairs-Rails: Right Home Layout: One level     Bathroom Shower/Tub: Company secretary: Standard     Home Equipment: Rollator (4 wheels)   Additional Comments: husband just d/c from hospital, will be relying on daughter for assist      Prior Functioning/Environment Prior Level of Function : Independent/Modified Independent             Mobility Comments: walks without AD ADLs Comments: independent        OT Problem List: Decreased strength;Impaired balance (sitting and/or standing);Decreased cognition;Decreased safety awareness      OT Treatment/Interventions: Self-care/ADL training;Balance training;Therapeutic exercise;Therapeutic activities;Patient/family education    OT Goals(Current goals can be found in the care plan section) Acute Rehab OT Goals Patient Stated Goal: To go home OT Goal Formulation: With family Time For Goal Achievement: 08/21/23 Potential to Achieve Goals: Good ADL Goals Pt Will Perform Grooming: standing;with supervision Pt Will Perform Lower Body Bathing: sitting/lateral leans;with supervision Pt Will Perform Lower Body Dressing: sitting/lateral leans;with supervision Pt Will Transfer to Toilet: with supervision;ambulating  OT Frequency: Min 1X/week    Co-evaluation              AM-PAC OT "6 Clicks" Daily Activity     Outcome Measure Help from another person eating meals?: None Help from another person taking care of personal grooming?: A Little Help from another person toileting, which includes using toliet, bedpan, or urinal?: A Lot Help from another person bathing (including washing, rinsing, drying)?: A Little Help from another person to put on and  taking off regular upper body clothing?: A Little Help from another person to put on and taking off regular lower body clothing?: A Lot 6 Click Score: 17   End of Session Equipment Utilized During Treatment: Gait belt Nurse Communication: Mobility status  Activity Tolerance: Patient tolerated treatment well Patient left: in chair;with call bell/phone  within reach;with family/visitor present;with chair alarm set  OT Visit Diagnosis: Unsteadiness on feet (R26.81);Other abnormalities of gait and mobility (R26.89);Muscle weakness (generalized) (M62.81);Other symptoms and signs involving cognitive function                Time: 4270-6237 OT Time Calculation (min): 30 min Charges:  OT General Charges $OT Visit: 1 Visit OT Evaluation $OT Eval Moderate Complexity: 1 Mod OT Treatments $Therapeutic Activity: 8-22 mins  08/07/2023  AB, OTR/L  Acute Rehabilitation Services  Office: [phone]   Tristan Schroeder 08/07/2023, 1:42 PM

## 2023-08-07 NOTE — Consult Note (Signed)
Reason for Consult: Acute kidney injury in patient with cirrhosis Referring Physician: Susa Raring, MD [facility])  HPI:  72 year old woman with past medical history significant for insulin-dependent type 2 diabetes mellitus, hypertension, dyslipidemia, fibromyalgia and NASH cirrhosis from longstanding NAFLD since 2019.  Recent hospitalization in July for hepatic encephalopathy revealed hepatic cirrhosis with portal hypertension, splenomegaly, multiple varices and ascites with a new 1.2 cm hypodensity in the left lower lobe of the liver along with pancreatic cysts.  She follows up with Dr. Barron Alvine and is on furosemide 40 mg daily, spironolactone 100 mg daily, lactulose, metoprolol and rifaximin.  Renal function appears to be normal at baseline with a creatinine of 0.8-1.0.  She was brought into the emergency room with altered mental status and decreased oral intake secondary to nausea/vomiting and found to have evidence of hypoglycemia and acute kidney injury (creatinine 2.26) with mildly elevated ammonia levels.  Renal function has not responded to volume expansion and concern is raised for HRS.  Urinalysis was unremarkable for hematuria or proteinuria and urine electrolytes have been requested/are pending.  She has not had any iodinated intravenous contrast since 07/01/2023 and has not been taking NSAIDs.  When seen today she complains of feeling weak and some shortness of breath without cough or sputum production.  She denies any dysuria, flank pain, fever, chills or hematuria.  She denies any chest pain.  Past Medical History:  Diagnosis Date   ALLERGIC RHINITIS 10/09/2007   Allergy    Arthritis    hips, spine   DIABETES MELLITUS, UNCONTROLLED 03/16/2009   FATIGUE 10/21/2008   FIBROMYALGIA 10/09/2007   GERD (gastroesophageal reflux disease)    diet  controlled, no meds   H/O vaginal hysterectomy    HYPERLIPIDEMIA 10/09/2007   diet controlled, no meds   HYPERTENSION 10/09/2007   Morton's  neuroma    OBSTRUCTIVE SLEEP APNEA 10/09/2007   Palpitations    Plantar fasciitis    History - right   Sleep apnea    Uses CPAP   TRANSAMINASES, SERUM, ELEVATED 07/08/2009    Past Surgical History:  Procedure Laterality Date   ABDOMINAL HYSTERECTOMY  1979   prolapsed uterus   BASAL CELL CARCINOMA EXCISION     BREAST BIOPSY Left 2012   left 2012 stereo   BREAST REDUCTION SURGERY Bilateral 1992   COLONOSCOPY  2006   [facility]   IR PARACENTESIS  08/01/2023   mamoplasty     reduction   REDUCTION MAMMAPLASTY Bilateral 1992   TONSILLECTOMY  1959   TUBAL LIGATION  1976    Family History  Problem Relation Age of Onset   Diabetes Mother    Arthritis Mother    Breast cancer Mother 48   Alcohol abuse Father    Arthritis Father    Hypertension Maternal Grandfather    Miscarriages / Stillbirths Paternal Grandfather    Breast cancer Maternal Aunt        diagnosed in her 62's   Colon polyps Neg Hx    Colon cancer Neg Hx    Esophageal cancer Neg Hx    Rectal cancer Neg Hx    Stomach cancer Neg Hx     Social History:  reports that she has never smoked. She has never used smokeless tobacco. She reports that she does not drink alcohol and does not use drugs.  Allergies:  Allergies  Allergen Reactions   Amoxicillin-Pot Clavulanate     Severe diarrhea   Codeine Sulfate     REACTION: "hyper"   Diflucan [Fluconazole]  Hives   Epinephrine Other (See Comments)    Racing heart rate   Erythromycin Base Diarrhea and Other (See Comments)    cramps   Invokana [Canagliflozin] Other (See Comments)    Recurrent yeast infections   Irbesartan-Hydrochlorothiazide Other (See Comments)    Unknown reaction   Victoza [Liraglutide] Nausea Only   Latex Rash    Long term use   Ozempic (0.25 Or 0.5 Mg-Dose) [Semaglutide(0.25 Or 0.5mg -Dos)] Nausea Only    Medications: I have reviewed the patient's current medications. Scheduled:  heparin  5,000 Units Subcutaneous Q8H   insulin aspart  0-5  Units Subcutaneous QHS   insulin aspart  0-9 Units Subcutaneous TID WC   lactulose  30 g Oral TID   metoprolol tartrate  25 mg Oral BID   midodrine  5 mg Oral TID WC   pantoprazole  40 mg Oral Daily   rifaximin  550 mg Oral BID   Continuous:  albumin human     cefTRIAXone (ROCEPHIN)  IV 1 g (08/07/23 1311)   magnesium sulfate bolus IVPB         Latest Ref Rng & Units 08/07/2023    3:29 AM 08/06/2023    7:55 AM 08/06/2023    3:00 AM  BMP  Glucose 70 - 99 mg/dL 829  62  562   BUN 8 - 23 mg/dL 44   49   Creatinine 1.30 - 1.00 mg/dL 8.65   7.84   Sodium 696 - 145 mmol/L 134   135   Potassium 3.5 - 5.1 mmol/L 3.9   3.1   Chloride 98 - 111 mmol/L 100   103   CO2 22 - 32 mmol/L 19   16   Calcium 8.9 - 10.3 mg/dL 8.6   8.0       Latest Ref Rng & Units 08/07/2023    3:29 AM 08/06/2023    3:00 AM 08/02/2023    9:56 PM  CBC  WBC 4.0 - 10.5 K/uL 5.0  6.7  10.3   Hemoglobin 12.0 - 15.0 g/dL 8.9  8.5  9.6   Hematocrit 36.0 - 46.0 % 27.2  26.6  30.4   Platelets 150 - 400 K/uL 83  84  110    Urinalysis    Component Value Date/Time   COLORURINE YELLOW 08/06/2023 0305   APPEARANCEUR CLEAR 08/06/2023 0305   LABSPEC 1.011 08/06/2023 0305   PHURINE 5.0 08/06/2023 0305   GLUCOSEU NEGATIVE 08/06/2023 0305   HGBUR NEGATIVE 08/06/2023 0305   HGBUR negative 01/08/2010 0826   BILIRUBINUR NEGATIVE 08/06/2023 0305   BILIRUBINUR n 06/12/2014 0916   KETONESUR NEGATIVE 08/06/2023 0305   PROTEINUR NEGATIVE 08/06/2023 0305   UROBILINOGEN 1.0 06/12/2014 0916   UROBILINOGEN 1.0 01/08/2010 0826   NITRITE NEGATIVE 08/06/2023 0305   LEUKOCYTESUR NEGATIVE 08/06/2023 0305   [place] ASCITES (ABDOMEN LIMITED)  Result Date: 08/07/2023 CLINICAL DATA:  72 year old female with suspected ascites. EXAM: LIMITED ABDOMEN ULTRASOUND FOR ASCITES TECHNIQUE: Limited ultrasound survey for ascites was performed in all four abdominal quadrants. COMPARISON:  Abdominal ultrasound 08/06/2023. FINDINGS: Moderate volume of free fluid  noted in the abdomen and pelvis in all 4 quadrants, slightly increased compared to the recent prior examination. Liver has a shrunken appearance and nodular contour, indicative of advanced cirrhosis. IMPRESSION: 1. Advanced cirrhosis with moderate volume of ascites, increased compared to the prior examination. Electronically Signed   By: Trudie Reed M.D.   On: 08/07/2023 13:12   DG Chest Port 1 View  Result Date:  08/07/2023 CLINICAL DATA:  72 year old female with history of shortness of breath. EXAM: PORTABLE CHEST 1 VIEW COMPARISON:  Chest x-ray 08/04/2023. FINDINGS: Chronic elevation of the right hemidiaphragm is unchanged. No acute consolidative airspace disease. However, there is widespread interstitial prominence and patchy peribronchial cuffing scattered throughout the lungs bilaterally. No pleural effusions. No pneumothorax. No evidence of pulmonary edema. Heart size is normal. Upper mediastinal contours are within normal limits. IMPRESSION: 1. Widespread interstitial prominence and peribronchial cuffing concerning for potential acute bronchitis. 2. Chronic elevation of the right hemidiaphragm is unchanged. Electronically Signed   By: Trudie Reed M.D.   On: 08/07/2023 07:09   US Abdomen Limited  Result Date: 08/06/2023 CLINICAL DATA:  Ascites EXAM: LIMITED ABDOMEN ULTRASOUND FOR ASCITES TECHNIQUE: Limited ultrasound survey for ascites was performed in all four abdominal quadrants. COMPARISON:  07/21/2023 FINDINGS: Small to moderate free fluid in the abdomen and pelvis, most notable in the right upper quadrant. This is significantly smaller than prior study. IMPRESSION: Small to moderate free fluid, significantly decreased since prior study. Electronically Signed   By: Charlett Nose M.D.   On: 08/06/2023 02:06   DG Chest Port 1 View  Result Date: 08/06/2023 CLINICAL DATA:  Altered mental status EXAM: PORTABLE CHEST 1 VIEW COMPARISON:  07/02/2023 FINDINGS: Mild elevation of the right  hemidiaphragm. Heart and mediastinal contours are within normal limits. No focal opacities or effusions. No acute bony abnormality. IMPRESSION: No active disease. Electronically Signed   By: Charlett Nose M.D.   On: 08/06/2023 00:01    Review of Systems  Constitutional:  Positive for appetite change and fatigue. Negative for chills and fever.  HENT:  Negative for nosebleeds, sinus pressure and sore throat.   Respiratory:  Positive for shortness of breath. Negative for cough and chest tightness.   Cardiovascular:  Negative for chest pain and leg swelling.  Gastrointestinal:  Positive for abdominal distention, nausea and vomiting. Negative for blood in stool and diarrhea.  Genitourinary:  Negative for dysuria, hematuria and urgency.  Musculoskeletal:  Negative for joint swelling.  Skin:  Positive for color change.       Easy bruising  Neurological:  Positive for weakness. Negative for dizziness and light-headedness.  Psychiatric/Behavioral:  Positive for confusion.    Blood pressure (!) 149/61, pulse (!) 125, temperature 98.5 F (36.9 C), temperature source Oral, resp. rate (!) 22, height 5\' 3"  (1.6 m), weight 68.4 kg, SpO2 98%. Physical Exam Vitals and nursing note reviewed.  Constitutional:      Appearance: She is obese. She is ill-appearing.  HENT:     Head: Normocephalic and atraumatic.     Right Ear: External ear normal.     Left Ear: External ear normal.     Nose: Nose normal.     Comments: On oxygen via nasal cannula    Mouth/Throat:     Mouth: Mucous membranes are moist.     Pharynx: Oropharynx is clear.  Eyes:     General: No scleral icterus.    Extraocular Movements: Extraocular movements intact.     Conjunctiva/sclera: Conjunctivae normal.  Cardiovascular:     Rate and Rhythm: Regular rhythm. Tachycardia present.     Heart sounds: Normal heart sounds.  Pulmonary:     Effort: Pulmonary effort is normal.     Breath sounds: Normal breath sounds. No wheezing.  Abdominal:      General: Bowel sounds are normal.     Palpations: Abdomen is soft.  Musculoskeletal:     Cervical back: Normal  range of motion and neck supple.     Right lower leg: No edema.     Left lower leg: No edema.  Skin:    General: Skin is warm and dry.     Findings: Bruising present.  Neurological:     General: No focal deficit present.     Mental Status: She is alert. Mental status is at baseline.     Assessment/Plan: 1.  Acute kidney injury: High index of suspicion for type I hepatorenal syndrome and will corroborate this by checking urine electrolytes.  Agree with empiric therapy with intravenous albumin/midodrine and will add Terlipressin 0.85 mg IV 4 times daily with close monitoring of her respiratory status and sodium level; pharmacy consult placed for this.  Will increase albumin to 75 g/day for the next 3 days.  We had a candid discussion regarding her poor outlook if this was truly hepatorenal syndrome as I fear that the physical burden of dialysis would be significantly harmful to her quality of life.  I agree and support the palliative care consult that has been placed (her daughter Shawna Orleans would like some time to cope with her father's recent hospitalization). 2.  Metabolic encephalopathy: Appears to be multifactorial from a combination of hepatic and renal sources along with preceding hypoglycemia.  Continue supportive management. 3.  Hyponatremia: Secondary to cirrhosis and activated ADH state as well as impaired free water handling in the setting of acute kidney injury.  Monitor with supportive management. 4.  NASH cirrhosis/recurrent ascites: Continue lactulose and Xifaxan with plans for repeat paracentesis (with a maximum of 3 L ultrafiltration) possibly today. 5.  Anemia: Secondary to chronic disease and without overt blood loss (except for bruising).  Monitor trend  Dagoberto Ligas 08/07/2023, 1:21 PM

## 2023-08-07 NOTE — Plan of Care (Signed)

## 2023-08-08 ENCOUNTER — Encounter: Payer: Self-pay | Admitting: Gastroenterology

## 2023-08-08 ENCOUNTER — Inpatient Hospital Stay (HOSPITAL_COMMUNITY): Payer: Medicare PPO

## 2023-08-08 DIAGNOSIS — Z515 Encounter for palliative care: Secondary | ICD-10-CM | POA: Diagnosis not present

## 2023-08-08 DIAGNOSIS — K7682 Hepatic encephalopathy: Secondary | ICD-10-CM | POA: Diagnosis not present

## 2023-08-08 DIAGNOSIS — N179 Acute kidney failure, unspecified: Secondary | ICD-10-CM | POA: Diagnosis not present

## 2023-08-08 DIAGNOSIS — Z7189 Other specified counseling: Secondary | ICD-10-CM | POA: Diagnosis not present

## 2023-08-08 DIAGNOSIS — K7581 Nonalcoholic steatohepatitis (NASH): Secondary | ICD-10-CM | POA: Diagnosis not present

## 2023-08-08 DIAGNOSIS — K7460 Unspecified cirrhosis of liver: Secondary | ICD-10-CM | POA: Diagnosis not present

## 2023-08-08 HISTORY — PX: IR PARACENTESIS: IMG2679

## 2023-08-08 LAB — CBC WITH DIFFERENTIAL/PLATELET
Abs Immature Granulocytes: 0.02 10*3/uL (ref 0.00–0.07)
Basophils Absolute: 0 10*3/uL (ref 0.0–0.1)
Basophils Relative: 0 %
Eosinophils Absolute: 0.3 10*3/uL (ref 0.0–0.5)
Eosinophils Relative: 5 %
HCT: 25.2 % — ABNORMAL LOW (ref 36.0–46.0)
Hemoglobin: 7.9 g/dL — ABNORMAL LOW (ref 12.0–15.0)
Immature Granulocytes: 0 %
Lymphocytes Relative: 16 %
Lymphs Abs: 0.8 10*3/uL (ref 0.7–4.0)
MCH: 26.3 pg (ref 26.0–34.0)
MCHC: 31.3 g/dL (ref 30.0–36.0)
MCV: 84 fL (ref 80.0–100.0)
Monocytes Absolute: 0.7 10*3/uL (ref 0.1–1.0)
Monocytes Relative: 13 %
Neutro Abs: 3.4 10*3/uL (ref 1.7–7.7)
Neutrophils Relative %: 66 %
Platelets: 54 10*3/uL — ABNORMAL LOW (ref 150–400)
RBC: 3 MIL/uL — ABNORMAL LOW (ref 3.87–5.11)
RDW: 17.6 % — ABNORMAL HIGH (ref 11.5–15.5)
WBC: 5.2 10*3/uL (ref 4.0–10.5)
nRBC: 0 % (ref 0.0–0.2)

## 2023-08-08 LAB — COMPREHENSIVE METABOLIC PANEL
ALT: 41 U/L (ref 0–44)
AST: 41 U/L (ref 15–41)
Albumin: 3.5 g/dL (ref 3.5–5.0)
Alkaline Phosphatase: 78 U/L (ref 38–126)
Anion gap: 12 (ref 5–15)
BUN: 38 mg/dL — ABNORMAL HIGH (ref 8–23)
CO2: 19 mmol/L — ABNORMAL LOW (ref 22–32)
Calcium: 8.3 mg/dL — ABNORMAL LOW (ref 8.9–10.3)
Chloride: 103 mmol/L (ref 98–111)
Creatinine, Ser: 1.45 mg/dL — ABNORMAL HIGH (ref 0.44–1.00)
GFR, Estimated: 38 mL/min — ABNORMAL LOW (ref >60)
Glucose, Bld: 144 mg/dL — ABNORMAL HIGH (ref 70–99)
Potassium: 3.2 mmol/L — ABNORMAL LOW (ref 3.5–5.1)
Sodium: 134 mmol/L — ABNORMAL LOW (ref 135–145)
Total Bilirubin: 2.4 mg/dL — ABNORMAL HIGH (ref 0.3–1.2)
Total Protein: 5.6 g/dL — ABNORMAL LOW (ref 6.5–8.1)

## 2023-08-08 LAB — BODY FLUID CELL COUNT WITH DIFFERENTIAL
Eos, Fluid: 0 %
Lymphs, Fluid: 27 %
Monocyte-Macrophage-Serous Fluid: 67 % (ref 50–90)
Neutrophil Count, Fluid: 6 % (ref 0–25)
Total Nucleated Cell Count, Fluid: 72 uL (ref 0–1000)

## 2023-08-08 LAB — C-REACTIVE PROTEIN: CRP: 1.8 mg/dL — ABNORMAL HIGH (ref <1.0)

## 2023-08-08 LAB — GRAM STAIN

## 2023-08-08 LAB — GLUCOSE, CAPILLARY
Glucose-Capillary: 140 mg/dL — ABNORMAL HIGH (ref 70–99)
Glucose-Capillary: 145 mg/dL — ABNORMAL HIGH (ref 70–99)
Glucose-Capillary: 155 mg/dL — ABNORMAL HIGH (ref 70–99)
Glucose-Capillary: 166 mg/dL — ABNORMAL HIGH (ref 70–99)
Glucose-Capillary: 168 mg/dL — ABNORMAL HIGH (ref 70–99)
Glucose-Capillary: 184 mg/dL — ABNORMAL HIGH (ref 70–99)
Glucose-Capillary: 223 mg/dL — ABNORMAL HIGH (ref 70–99)

## 2023-08-08 LAB — MAGNESIUM: Magnesium: 1.9 mg/dL (ref 1.7–2.4)

## 2023-08-08 LAB — PROTIME-INR
INR: 1.6 — ABNORMAL HIGH (ref 0.8–1.2)
Prothrombin Time: 18.8 s — ABNORMAL HIGH (ref 11.4–15.2)

## 2023-08-08 LAB — BRAIN NATRIURETIC PEPTIDE: B Natriuretic Peptide: 494.4 pg/mL — ABNORMAL HIGH (ref 0.0–100.0)

## 2023-08-08 LAB — PROCALCITONIN: Procalcitonin: 0.6 ng/mL

## 2023-08-08 MED ORDER — HALOPERIDOL LACTATE 5 MG/ML IJ SOLN
2.0000 mg | Freq: Four times a day (QID) | INTRAMUSCULAR | Status: DC | PRN
  Filled 2023-08-08: qty 1

## 2023-08-08 MED ORDER — LIDOCAINE HCL 1 % IJ SOLN
INTRAMUSCULAR | Status: AC
  Filled 2023-08-08: qty 20

## 2023-08-08 MED ORDER — POTASSIUM CHLORIDE CRYS ER 20 MEQ PO TBCR
40.0000 meq | EXTENDED_RELEASE_TABLET | Freq: Once | ORAL | Status: AC
  Administered 2023-08-08: 40 meq via ORAL
  Filled 2023-08-08: qty 2

## 2023-08-08 NOTE — Progress Notes (Signed)
Physical Therapy Treatment Patient Details Name: Dana Dyer MRN: [id number] DOB: 01/24/1951 Today's Date: 08/08/2023   History of Present Illness The pt is a 72 yo female presenting 8/31 with AMS. Pt found to be severely dehydrated causing AKI which led to insulin build up causing hypoglycemia and hypothermia. PMH includes: NASH cirrhosis, HTN, hepatic encephalopathy, GERD, DM II, and anxiety.    PT Comments  Patient attempting to get OOB with alarm sounding on arrival. Patient aware she had had diarrhea and needed to be cleaned up, however wanted to get to the chair first. Educated on need to clean her before trying to get up and pt agreeable. Session focused on functional mobility while cleaning pt. As soon as she sat on EOB, she again had diarrhea and had to return to supine for cleaning. NT in and reports the diarrhea has been constant and RN is waiting on a rectal pouch to put on patient.     If plan is discharge home, recommend the following: A lot of help with walking and/or transfers;A lot of help with bathing/dressing/bathroom;Assistance with cooking/housework;Direct supervision/assist for medications management;Direct supervision/assist for financial management;Assist for transportation;Help with stairs or ramp for entrance   Can travel by private vehicle        Equipment Recommendations  Wheelchair (measurements PT);Wheelchair cushion (measurements PT);BSC/3in1;Rolling walker (2 wheels) (HH aide)    Recommendations for Other Services       Precautions / Restrictions Precautions Precautions: Fall Precaution Comments: watch HR Restrictions Weight Bearing Restrictions: No     Mobility  Bed Mobility Overal bed mobility: Needs Assistance Bed Mobility: Rolling, Sidelying to Sit, Sit to Supine Rolling: Supervision, Used rails (rt and left x2-3 reps) Sidelying to sit: Min assist, HOB elevated   Sit to supine: Contact guard assist   General bed mobility comments: rolling  for pericare due to diarrhea (present on arrival and again with supine to sit); assist to raise torso, no physical assist to return to supine    Transfers Overall transfer level: Needs assistance Equipment used: None              Lateral/Scoot Transfers: Contact guard assist General transfer comment: As pt came to sit, noted again had diarrhea. pt able to laterally scoot to her right x 3 to get up to [facility] prior to return to supine    Ambulation/Gait               General Gait Details: unable due to diarrhea   Stairs             Wheelchair Mobility     Tilt Bed    Modified Rankin (Stroke Patients Only)       Balance Overall balance assessment: Needs assistance Sitting-balance support: No upper extremity supported, Feet supported Sitting balance-Leahy Scale: Fair                                      Cognition Arousal: Alert Behavior During Therapy: WFL for tasks assessed/performed Overall Cognitive Status: Impaired/Different from baseline Area of Impairment: Memory, Following commands, Safety/judgement, Problem solving, Attention, Awareness                   Current Attention Level: Focused, Sustained Memory: Decreased short-term memory, Decreased recall of precautions Following Commands: Follows one step commands inconsistently, Follows one step commands with increased time Safety/Judgement: Decreased awareness of safety, Decreased awareness of deficits Awareness:  Intellectual Problem Solving: Slow processing, Decreased initiation, Difficulty sequencing, Requires verbal cues, Requires tactile cues General Comments: decr awareness of constant diarrhea--despite cleaning her up twice during session and explaining not ok to get up to chair right now, she then asked if she could go walking        Exercises      General Comments        Pertinent Vitals/Pain Pain Assessment Pain Assessment: No/denies pain Faces Pain Scale: No  hurt    Home Living                          Prior Function            PT Goals (current goals can now be found in the care plan section) Acute Rehab PT Goals Patient Stated Goal: return home, plan for daughter to assist Time For Goal Achievement: 08/20/23 Potential to Achieve Goals: Good Progress towards PT goals: Not progressing toward goals - comment (continuous diarrhea impeding mobility)    Frequency    Min 1X/week      PT Plan      Co-evaluation              AM-PAC PT "6 Clicks" Mobility   Outcome Measure  Help needed turning from your back to your side while in a flat bed without using bedrails?: A Little Help needed moving from lying on your back to sitting on the side of a flat bed without using bedrails?: A Little Help needed moving to and from a bed to a chair (including a wheelchair)?: A Little Help needed standing up from a chair using your arms (e.g., wheelchair or bedside chair)?: A Little Help needed to walk in hospital room?: Total (<20 ft) Help needed climbing 3-5 steps with a railing? : Total 6 Click Score: 14    End of Session   Activity Tolerance: Patient tolerated treatment well;Treatment limited secondary to medical complications (Comment) (continuous diarrhea) Patient left: in bed;with call bell/phone within reach;with bed alarm set;with nursing/sitter in room   PT Visit Diagnosis: Unsteadiness on feet (R26.81);Muscle weakness (generalized) (M62.81)     Time: 1525-1550 PT Time Calculation (min) (ACUTE ONLY): 25 min  Charges:    $Therapeutic Activity: 23-37 mins PT General Charges $$ ACUTE PT VISIT: 1 Visit                      Jerolyn Center, PT Acute Rehabilitation Services  Office [phone]    Zena Amos 08/08/2023, 4:05 PM

## 2023-08-08 NOTE — Progress Notes (Signed)
Progress Note   Subjective  Patient responding appropriately to questioning this morning. States she is feeling well and wants to go home. Daughter at bedside who concurs her mental status is much better today. Pending initiation of terlipressin today and paracentesis.   Objective   Vital signs in last 24 hours: Temp:  [97.4 F (36.3 C)-98.5 F (36.9 C)] 98.5 F (36.9 C) (09/03 0430) Pulse Rate:  [88-125] 90 (09/03 0430) Resp:  [13-22] 20 (09/03 0826) BP: (108-149)/(54-81) 113/81 (09/03 0430) SpO2:  [95 %-100 %] 95 % (09/03 0430) Weight:  [69.3 kg] 69.3 kg (09/03 0500) Last BM Date : 08/07/23 General:    white female in NAD, eating breakfast. Responding appropriately to questioning Neurologic:  Alert and oriented Psych:  Cooperative. Normal mood and affect.  Intake/Output from previous day: 09/02 0701 - 09/03 0700 In: 1380 [P.O.:1080; IV Piggyback:300] Out: 300 [Urine:300] Intake/Output this shift: No intake/output data recorded.  Lab Results: Recent Labs    07/09/2023 2156 08/06/23 0300 08/07/23 0329  WBC 10.3 6.7 5.0  HGB 9.6* 8.5* 8.9*  HCT 30.4* 26.6* 27.2*  PLT 110* 84* 83*   BMET Recent Labs    08/06/23 0300 08/06/23 0755 08/07/23 0329 08/08/23 0637  NA 135  --  134* 134*  K 3.1*  --  3.9 3.2*  CL 103  --  100 103  CO2 16*  --  19* 19*  GLUCOSE 161* 62* 171* 144*  BUN 49*  --  44* 38*  CREATININE 2.06*  --  2.09* 1.45*  CALCIUM 8.0*  --  8.6* 8.3*   LFT Recent Labs    08/08/23 0637  PROT 5.6*  ALBUMIN 3.5  AST 41  ALT 41  ALKPHOS 78  BILITOT 2.4*   PT/INR Recent Labs    08/06/23 0010 08/08/23 0637  LABPROT 15.7* 18.8*  INR 1.2 1.6*    Studies/Results: [place] ASCITES (ABDOMEN LIMITED)  Result Date: 08/07/2023 CLINICAL DATA:  72 year old female with suspected ascites. EXAM: LIMITED ABDOMEN ULTRASOUND FOR ASCITES TECHNIQUE: Limited ultrasound survey for ascites was performed in all four abdominal quadrants. COMPARISON:  Abdominal  ultrasound 08/06/2023. FINDINGS: Moderate volume of free fluid noted in the abdomen and pelvis in all 4 quadrants, slightly increased compared to the recent prior examination. Liver has a shrunken appearance and nodular contour, indicative of advanced cirrhosis. IMPRESSION: 1. Advanced cirrhosis with moderate volume of ascites, increased compared to the prior examination. Electronically Signed   By: Trudie Reed M.D.   On: 08/07/2023 13:12   DG Chest Port 1 View  Result Date: 08/07/2023 CLINICAL DATA:  72 year old female with history of shortness of breath. EXAM: PORTABLE CHEST 1 VIEW COMPARISON:  Chest x-ray 08/04/2023. FINDINGS: Chronic elevation of the right hemidiaphragm is unchanged. No acute consolidative airspace disease. However, there is widespread interstitial prominence and patchy peribronchial cuffing scattered throughout the lungs bilaterally. No pleural effusions. No pneumothorax. No evidence of pulmonary edema. Heart size is normal. Upper mediastinal contours are within normal limits. IMPRESSION: 1. Widespread interstitial prominence and peribronchial cuffing concerning for potential acute bronchitis. 2. Chronic elevation of the right hemidiaphragm is unchanged. Electronically Signed   By: Trudie Reed M.D.   On: 08/07/2023 07:09       Assessment / Plan:    72 y/o female here with the following:  AKI - possible HRS NASH cirrhosis Ascites Hepatic encephalopathy  Nephrology consultation yesterday - continued midodrine and albumin, planned to start terlipressin. Continuing lactulose / rifaximin for HE. Clinically  she looks much better today, her encephalopathy is much improved per discussion with daughter. Mental status seems appropriate on exam today. She ate a good breakfast.  Renal function improved this AM - Cr now 1.45, down from 2.09 which is very good news. Hopefully this trend continues. Overall she has had a nice response to therapy thus far, but they understand  significance of this deterioration and risk for worsening / recurrence over time. Palliative care consultation planned for later today to further discuss goals of care, especially if she should worsen.  For now, continue medical therapy for AKI per nephrology, monitor renal function daily. She is scheduled for paracentesis today, please send cell count to rule out SBP. On empiric antibiotics for now. Continue lactulose / rifaximin.   Plan: - continue with albumin infusion, midodrine. Possible terlipressin , defer to Nephrology given her improvement today - paracentesis today - please send cell count to rule out SBP - continue lactulose / rifaximin - palliative care consultation today - continue diet - ate well this AM, encouraged good nutrition  Will follow, call with questions.  Harlin Rain, MD [facility]

## 2023-08-08 NOTE — Progress Notes (Signed)
PROGRESS NOTE                                                                                                                                                                                                             Patient Demographics:    Dana Dyer, is a 72 y.o. female, DOB - 12/14/1950, UEA:[id number]  Outpatient Primary MD for the patient is Karie Georges, MD    LOS - 2  Admit date - 07/17/2023    Chief Complaint  Patient presents with   Altered Mental Status       Brief Narrative (HPI from H&P)    72 y.o. female with medical history significant of [place] cirrhosis, chronic ascites, hypertension, hepatic encephalopathy, GERD, multiple cystic pancreatic lesion, GERD, hemorrhoid s/p banding, insulin dependent DM type II and generalized anxiety disorder presented to emergency department for evaluation for altered mental status.  Further workup showed that she was severely dehydrated >>  AKI >> relative insulin overdose due to AKI,  hypoglycemia and hypothermia.  She was admitted for metabolic encephalopathy caused by combination of AKI, dehydration, hypoglycemia and mildly elevated ammonia levels.   Subjective:   Patient in bed, mildly confused, denies any headache chest or abdominal pain, no shortness of breath.   Assessment  & Plan :    Overall medically and quality of life wise declining gradually over the last 4 to 6 weeks, case discussed with her GI team, developing recurrent ascites due to her underlying NASH, recurrent hepatic encephalopathy, now likely hepatorenal syndrome.  Long discussion with patient's daughter, DNR, interested in talking with palliative care.  If significant decline they are open for home hospice.  For now continue medical management.    Severe dehydration causing AKI, due to AKI insulin buildup causing hypoglycemia, dehydration and hypoperfusion induced hypothermia.  Is being  hydrated as tolerated by her lungs and pulmonary edema due to third spacing, glucagon was used to reverse insulin, advancing oral diet, mental status improving, hold long-acting insulin for now.  Supportive care CBGs and temperature has improved, stable TSH, A1c and random cortisol.  Hypoglycemia has improved.  AKI with metabolic acidosis, question early hepatorenal syndrome.  Still clinically dehydrated but due to protein calorie malnutrition caused by NASH IV fluids are getting third spaced and accumulating in her lungs, have to balance between diuretics, routine supplements,  currently placed on albumin 3 times daily along with midodrine, nephrology on board, renal function mildly improved continue to monitor.  This might be temporary improvement but will monitor closely.  Metabolic encephalopathy due to combination of severe dehydration, AKI, hypoglycemia and mildly elevated ammonia/hepatic encephalopathy.  Continue lactulose and Xifaxan, hydrate, supportive care as above.  No headache or focal deficits.  Mental status already improving.  History of left liver lobe hypodensity and multiple pancreatic cysts.  Following with [facility].  Due for outpatient MRCP on 08/20/2023.  History of dysphagia.  SLP follow-up.  NASH - continue lactulose along with Xifaxan, bedside ultrasound 08/06/2023 was unimpressive for fluid/site es per IR, abdomen more distended on 08/07/2023, repeat abdominal ultrasound with moderate ascites, therapeutic and diagnostic paracentesis ordered 08/07/2023 with appropriate labs, continue Rocephin, albumin 3 times daily today as well along with midodrine.  GERD - PPI  Anemia of chronic disease.  Stable monitor expect some fall due to heme dilution.  DM type II.  Currently hypoglycemic, stable A1c, hold Lantus, sliding scale insulin.  Lab Results  Component Value Date   HGBA1C 6.3 (H) 08/06/2023   CBG (last 3)  Recent Labs    08/08/23 0016 08/08/23 0518 08/08/23 0825  GLUCAP  168* 140* 145*    Lab Results  Component Value Date   TSH 1.697 08/07/2023        Condition - Extremely Guarded  Family Communication  :   Daughter Lucy Chris [phone] - 08/07/23, 08/08/2023  Husband Rayna Sexton - [phone]  Female relative 08/06/2023  Code Status : DNR  Consults  : [facility], nephrology, palliative care, IR  PUD Prophylaxis : PPI   Procedures  :     Abd [place] -  Small to moderate free fluid, significantly decreased since prior study      Disposition Plan  :    Status is: Inpatient   DVT Prophylaxis  :    heparin injection 5,000 Units Start: 08/06/23 0600 SCDs Start: 08/06/23 0123    Lab Results  Component Value Date   PLT 54 (L) 08/08/2023    Diet :  Diet Order             DIET SOFT Fluid consistency: Thin  Diet effective now                    Inpatient Medications  Scheduled Meds:  heparin  5,000 Units Subcutaneous Q8H   insulin aspart  0-5 Units Subcutaneous QHS   insulin aspart  0-9 Units Subcutaneous TID WC   lactulose  30 g Oral TID   metoprolol tartrate  25 mg Oral BID   midodrine  5 mg Oral TID WC   pantoprazole  40 mg Oral Daily   potassium chloride  40 mEq Oral Once   rifaximin  550 mg Oral BID   Continuous Infusions:  albumin human 75 g (08/07/23 1749)   cefTRIAXone (ROCEPHIN)  IV 1 g (08/07/23 1311)   PRN Meds:.acetaminophen **OR** acetaminophen, dextrose, metoprolol tartrate, [DISCONTINUED] ondansetron **OR** ondansetron (ZOFRAN) IV, mouth rinse, senna-docusate  Antibiotics  :    Anti-infectives (From admission, onward)    Start     Dose/Rate Route Frequency Ordered Stop   08/06/23 1130  cefTRIAXone (ROCEPHIN) 1 g in sodium chloride 0.9 % 100 mL IVPB        1 g 200 mL/hr over 30 Minutes Intravenous Every 24 hours 08/06/23 1038 08/11/23 1129   08/06/23 1000  rifaximin (XIFAXAN) tablet 550 mg  550 mg Oral 2 times daily 08/06/23 0122           Objective:   Vitals:   08/08/23 0430 08/08/23  0500 08/08/23 0826 08/08/23 0845  BP: 113/81   119/63  Pulse: 90   100  Resp: 17  20   Temp: 98.5 F (36.9 C)     TempSrc: Oral  Oral   SpO2: 95%     Weight:  69.3 kg    Height:        Wt Readings from Last 3 Encounters:  08/08/23 69.3 kg  07/13/23 74.2 kg  07/10/23 72.1 kg     Intake/Output Summary (Last 24 hours) at 08/08/2023 0952 Last data filed at 08/08/2023 0559 Gross per 24 hour  Intake 1380 ml  Output 300 ml  Net 1080 ml     Physical Exam  Awake , less confused,  No new F.N deficits, Normal affect [place].AT,PERRAL Supple Neck, No JVD,   Symmetrical Chest wall movement, Good air movement bilaterally, CTAB RRR,No Gallops,Rubs or new Murmurs,  +ve B.Sounds, Abd distended, No tenderness,   No Cyanosis, Clubbing or edema       Data Review:    Recent Labs  Lab 07/20/2023 2156 08/06/23 0300 08/07/23 0329 08/08/23 0637  WBC 10.3 6.7 5.0 5.2  HGB 9.6* 8.5* 8.9* 7.9*  HCT 30.4* 26.6* 27.2* 25.2*  PLT 110* 84* 83* 54*  MCV 83.3 82.6 82.7 84.0  MCH 26.3 26.4 27.1 26.3  MCHC 31.6 32.0 32.7 31.3  RDW 17.1* 17.2* 17.2* 17.6*  LYMPHSABS 0.9  --  0.6* 0.8  MONOABS 1.3*  --  0.6 0.7  EOSABS 0.3  --  0.2 0.3  BASOSABS 0.1  --  0.0 0.0    Recent Labs  Lab 07/28/2023 2156 07/27/2023 2207 07/16/2023 2244 08/06/23 0010 08/06/23 0300 08/06/23 0546 08/06/23 0755 08/06/23 1017 08/07/23 0329 08/07/23 1023 08/08/23 0637  NA 132*  --   --   --  135  --   --   --  134*  --  134*  K 3.6  --   --   --  3.1*  --   --   --  3.9  --  3.2*  CL 100  --   --   --  103  --   --   --  100  --  103  CO2 16*  --   --   --  16*  --   --   --  19*  --  19*  ANIONGAP 16*  --   --   --  16*  --   --   --  15  --  12  GLUCOSE 83  --   --   --  161*  --  62*  --  171*  --  144*  BUN 49*  --   --   --  49*  --   --   --  44*  --  38*  CREATININE 2.26*  --   --   --  2.06*  --   --   --  2.09*  --  1.45*  AST 65*  --   --   --  53*  --   --   --  54*  --  41  ALT 77*  --   --   --  63*  --    --   --  60*  --  41  ALKPHOS 120  --   --   --  101  --   --   --  106  --  78  BILITOT 2.1*  --   --   --  2.2*  --   --   --  2.4*  --  2.4*  ALBUMIN 2.6*  --   --   --  2.2*  --   --   --  3.2*  --  3.5  CRP  --   --   --   --   --   --   --  2.0* 2.0*  --  1.8*  PROCALCITON  --   --   --   --   --  0.15  --   --  0.88  --   --   LATICACIDVEN  --   --  1.7  --   --   --   --   --   --   --   --   INR  --   --   --  1.2  --   --   --   --   --   --  1.6*  TSH  --   --   --   --   --  1.321  --   --  1.697  --   --   HGBA1C  --   --   --   --   --  6.3*  --   --   --   --   --   AMMONIA  --  49*  --   --   --  69*  --   --   --  53*  --   BNP  --   --   --   --   --  70.9  --   --  260.5*  --  494.4*  MG  --   --   --   --   --  1.7  --   --  1.7  --  1.9  CALCIUM 8.7*  --   --   --  8.0*  --   --   --  8.6*  --  8.3*      Recent Labs  Lab 07/06/2023 2156 07/20/2023 2207 07/27/2023 2244 08/06/23 0010 08/06/23 0300 08/06/23 0546 08/06/23 1017 08/07/23 0329 08/07/23 1023 08/08/23 0637  CRP  --   --   --   --   --   --  2.0* 2.0*  --  1.8*  PROCALCITON  --   --   --   --   --  0.15  --  0.88  --   --   LATICACIDVEN  --   --  1.7  --   --   --   --   --   --   --   INR  --   --   --  1.2  --   --   --   --   --  1.6*  TSH  --   --   --   --   --  1.321  --  1.697  --   --   HGBA1C  --   --   --   --   --  6.3*  --   --   --   --   AMMONIA  --  49*  --   --   --  69*  --   --  53*  --   BNP  --   --   --   --   --  70.9  --  260.5*  --  494.4*  MG  --   --   --   --   --  1.7  --  1.7  --  1.9  CALCIUM 8.7*  --   --   --  8.0*  --   --  8.6*  --  8.3*    --------------------------------------------------------------------------------------------------------------- Lab Results  Component Value Date   CHOL 154 09/30/2021   HDL 59.30 09/30/2021   LDLCALC 69 09/30/2021   LDLDIRECT 124.9 10/14/2010   TRIG 128.0 09/30/2021   CHOLHDL 3 09/30/2021    Lab Results  Component Value  Date   HGBA1C 6.3 (H) 08/06/2023   Recent Labs    08/07/23 0329  TSH 1.697       Micro Results Recent Results (from the past 240 hour(s))  Culture, body fluid w Gram Stain-bottle     Status: None   Collection Time: 08/01/23 11:05 AM   Specimen: Peritoneal Washings  Result Value Ref Range Status   Specimen Description PERITONEAL  Final   Special Requests NONE  Final   Culture   Final    NO GROWTH 5 DAYS Performed at [facility], [address], [place], [place] [address]    Report Status 08/06/2023 FINAL  Final  Gram stain     Status: None   Collection Time: 08/01/23 11:05 AM   Specimen: Peritoneal Washings  Result Value Ref Range Status   Specimen Description PERITONEAL  Final   Special Requests NONE  Final   Gram Stain   Final    NO WBC SEEN NO ORGANISMS SEEN Performed at [facility], [address], [place], [place] [address]    Report Status 08/01/2023 FINAL  Final  Culture, blood (Routine X 2) w Reflex to ID Panel     Status: None (Preliminary result)   Collection Time: 08/06/23  2:50 AM   Specimen: BLOOD  Result Value Ref Range Status   Specimen Description BLOOD RIGHT ANTECUBITAL  Final   Special Requests   Final    BOTTLES DRAWN AEROBIC AND ANAEROBIC Blood Culture results may not be optimal due to an excessive volume of blood received in culture bottles   Culture   Final    NO GROWTH 2 DAYS Performed at [facility], [address], [place], [place] [address]    Report Status PENDING  Incomplete  Culture, blood (Routine X 2) w Reflex to ID Panel     Status: None (Preliminary result)   Collection Time: 08/06/23  3:00 AM   Specimen: BLOOD RIGHT HAND  Result Value Ref Range Status   Specimen Description BLOOD RIGHT HAND  Final   Special Requests   Final    BOTTLES DRAWN AEROBIC AND ANAEROBIC Blood Culture adequate volume   Culture   Final    NO GROWTH 2 DAYS Performed at [facility], [address] [place], [place] [address]     Report Status PENDING  Incomplete    Radiology Reports [place] ASCITES (ABDOMEN LIMITED)  Result Date: 08/07/2023 CLINICAL DATA:  72 year old female with suspected ascites. EXAM: LIMITED ABDOMEN ULTRASOUND FOR ASCITES TECHNIQUE: Limited ultrasound survey for ascites was performed in all four abdominal quadrants. COMPARISON:  Abdominal ultrasound 08/06/2023. FINDINGS: Moderate volume of free fluid noted in the abdomen and pelvis in all 4 quadrants, slightly increased compared to the recent prior examination. Liver has a shrunken appearance and nodular contour, indicative of advanced cirrhosis. IMPRESSION: 1. Advanced  cirrhosis with moderate volume of ascites, increased compared to the prior examination. Electronically Signed   By: Trudie Reed M.D.   On: 08/07/2023 13:12   DG Chest Port 1 View  Result Date: 08/07/2023 CLINICAL DATA:  72 year old female with history of shortness of breath. EXAM: PORTABLE CHEST 1 VIEW COMPARISON:  Chest x-ray 07/11/2023. FINDINGS: Chronic elevation of the right hemidiaphragm is unchanged. No acute consolidative airspace disease. However, there is widespread interstitial prominence and patchy peribronchial cuffing scattered throughout the lungs bilaterally. No pleural effusions. No pneumothorax. No evidence of pulmonary edema. Heart size is normal. Upper mediastinal contours are within normal limits. IMPRESSION: 1. Widespread interstitial prominence and peribronchial cuffing concerning for potential acute bronchitis. 2. Chronic elevation of the right hemidiaphragm is unchanged. Electronically Signed   By: Trudie Reed M.D.   On: 08/07/2023 07:09   US Abdomen Limited  Result Date: 08/06/2023 CLINICAL DATA:  Ascites EXAM: LIMITED ABDOMEN ULTRASOUND FOR ASCITES TECHNIQUE: Limited ultrasound survey for ascites was performed in all four abdominal quadrants. COMPARISON:  07/21/2023 FINDINGS: Small to moderate free fluid in the abdomen and pelvis, most notable in the right upper  quadrant. This is significantly smaller than prior study. IMPRESSION: Small to moderate free fluid, significantly decreased since prior study. Electronically Signed   By: Charlett Nose M.D.   On: 08/06/2023 02:06   DG Chest Port 1 View  Result Date: 08/06/2023 CLINICAL DATA:  Altered mental status EXAM: PORTABLE CHEST 1 VIEW COMPARISON:  07/02/2023 FINDINGS: Mild elevation of the right hemidiaphragm. Heart and mediastinal contours are within normal limits. No focal opacities or effusions. No acute bony abnormality. IMPRESSION: No active disease. Electronically Signed   By: Charlett Nose M.D.   On: 08/06/2023 00:01      Signature  -   Susa Raring M.D on 08/08/2023 at 9:52 AM   -  To page go to [email or web address]

## 2023-08-08 NOTE — Progress Notes (Signed)
PT Cancellation Note  Patient Details Name: Dana Dyer MRN: [id number] DOB: 02-27-1951   Cancelled Treatment:    Reason Eval/Treat Not Completed: Fatigue/lethargy limiting ability to participate  Patient just back to bed with nursing assist. Asleep and briefly arouses. States "yes" to doing some PT, however cannot stay awake long enough to participate. Will reattempt later today as schedule permits.   Jerolyn Center, PT Acute Rehabilitation Services  Office [phone]  Zena Amos 08/08/2023, 11:43 AM

## 2023-08-08 NOTE — Plan of Care (Signed)

## 2023-08-08 NOTE — Progress Notes (Addendum)
Patient ID: Dana Dyer, female   DOB: 16-Jul-1951, 72 y.o.   MRN: [id number] [facility] Progress Note   Assessment/ Plan:   1.  Acute kidney injury: Highly suspicious for type I HRS but with remarkable improvement overnight on albumin/midodrine possibly pointing to intravascular volume contraction/ineffective arterial blood volume with third spacing.  Will cancel orders for terlipressin given her improvement and continue albumin/midodrine for at least another 24 to 48 hours.  Discussions with family noted and remarkable for transition to home with hospice if worsens/no improvement seen. 2.  Metabolic encephalopathy: Appears to be multifactorial from a combination of hepatic and renal sources along with preceding hypoglycemia.  Mental status continues to wax and wane. 3.  Hyponatremia: Secondary to cirrhosis and activated ADH state as well as impaired free water handling in the setting of acute kidney injury.  Sodium level stable overnight. 4.  NASH cirrhosis/recurrent ascites: Continue lactulose and Xifaxan with plans for repeat paracentesis today (with a maximum of 3 L ultrafiltration) by IR. 5.  Anemia: Secondary to chronic disease and without overt blood loss (except for bruising).  Monitor trend 6.  Hypokalemia: Ongoing oral replacement.  Subjective:   Denies any acute events overnight, needs help repositioning in recliner.   Objective:   BP 119/63   Pulse 100   Temp 99.2 F (37.3 C) (Oral)   Resp 20   Ht 5\' 3"  (1.6 m)   Wt 69.3 kg   SpO2 96%   BMI 27.06 kg/m   Intake/Output Summary (Last 24 hours) at 08/08/2023 1108 Last data filed at 08/08/2023 0559 Gross per 24 hour  Intake 1380 ml  Output --  Net 1380 ml   Weight change: 0.9 kg  Physical Exam: Gen: Somewhat confused, resting comfortably in recliner.  Daughter at bedside CVS: Pulse regular tachycardia, ejection systolic murmur audible Resp: Diminished breath sounds over bases, no rales/rhonchi Abd:  Moderately distended, nontender, bowel sounds normal Ext: Trace lower extremity edema  Imaging: [place] ASCITES (ABDOMEN LIMITED)  Result Date: 08/07/2023 CLINICAL DATA:  72 year old female with suspected ascites. EXAM: LIMITED ABDOMEN ULTRASOUND FOR ASCITES TECHNIQUE: Limited ultrasound survey for ascites was performed in all four abdominal quadrants. COMPARISON:  Abdominal ultrasound 08/06/2023. FINDINGS: Moderate volume of free fluid noted in the abdomen and pelvis in all 4 quadrants, slightly increased compared to the recent prior examination. Liver has a shrunken appearance and nodular contour, indicative of advanced cirrhosis. IMPRESSION: 1. Advanced cirrhosis with moderate volume of ascites, increased compared to the prior examination. Electronically Signed   By: Trudie Reed M.D.   On: 08/07/2023 13:12   DG Chest Port 1 View  Result Date: 08/07/2023 CLINICAL DATA:  71 year old female with history of shortness of breath. EXAM: PORTABLE CHEST 1 VIEW COMPARISON:  Chest x-ray 07/09/2023. FINDINGS: Chronic elevation of the right hemidiaphragm is unchanged. No acute consolidative airspace disease. However, there is widespread interstitial prominence and patchy peribronchial cuffing scattered throughout the lungs bilaterally. No pleural effusions. No pneumothorax. No evidence of pulmonary edema. Heart size is normal. Upper mediastinal contours are within normal limits. IMPRESSION: 1. Widespread interstitial prominence and peribronchial cuffing concerning for potential acute bronchitis. 2. Chronic elevation of the right hemidiaphragm is unchanged. Electronically Signed   By: Trudie Reed M.D.   On: 08/07/2023 07:09    Labs: BMET Recent Labs  Lab 07/25/2023 2156 08/06/23 0300 08/06/23 0755 08/07/23 0329 08/08/23 0637  NA 132* 135  --  134* 134*  K 3.6 3.1*  --  3.9 3.2*  CL 100 103  --  100 103  CO2 16* 16*  --  19* 19*  GLUCOSE 83 161* 62* 171* 144*  BUN 49* 49*  --  44* 38*  CREATININE  2.26* 2.06*  --  2.09* 1.45*  CALCIUM 8.7* 8.0*  --  8.6* 8.3*   CBC Recent Labs  Lab 07/31/2023 2156 08/06/23 0300 08/07/23 0329 08/08/23 0637  WBC 10.3 6.7 5.0 5.2  NEUTROABS 7.7  --  3.6 3.4  HGB 9.6* 8.5* 8.9* 7.9*  HCT 30.4* 26.6* 27.2* 25.2*  MCV 83.3 82.6 82.7 84.0  PLT 110* 84* 83* 54*    Medications:     heparin  5,000 Units Subcutaneous Q8H   insulin aspart  0-5 Units Subcutaneous QHS   insulin aspart  0-9 Units Subcutaneous TID WC   lactulose  30 g Oral TID   metoprolol tartrate  25 mg Oral BID   midodrine  5 mg Oral TID WC   pantoprazole  40 mg Oral Daily   rifaximin  550 mg Oral BID   [name], MD 08/08/2023, 11:08 AM

## 2023-08-08 NOTE — Discharge Instructions (Signed)
PRIVATE DUTY CARE AGENCIES (not covered by insurance) for follow up if extra resources are needed:   This list is solely for the convenience of the patient/family.  [facility] is not recommending any agency, has made no effort to review their qualifications, and the patient/family is responsible for making their own independent determination of the qualification of any agency.  Any decision to engage an agency must be made between the patient/family and agency directly and does not involve [facility] in any way.             AGENCY PHONE/FAX ADDRESS [place] SERVED  [facility] and [employer] [phone]. [phone]  [address] [place], [place]  [address] All surrounding counties  [facility] [phone]  [address] [place], [place]  [address] Dominga Ferry, Haynes Bast, Duke Salvia, Aaron Edelman  [employer] [phone]  [address] [place], [place]  [address] Nolene Ebbs  [facility] [phone]   [address] [place], [place] [address] Rolinda Roan,   [facility] [phone] [address] [place], [place], [place] [address]   [facility] Care/Nurse Aide [phone]  [address] [place], [place]  [address] [place], Roda Shutters, Renard Hamper, Haynes Bast, Duke Salvia, Aaron Edelman  [facility] [employer] [phone] [address] [place], [place]  [address] [place]  [facility] [phone] [address] [place], [place]  [address] Gladstone Pih  [facility] [phone] [address], [place], [place] [address]   [facility] [phone] [address] [place], [place]  [address] [place], Ishmael Holter  [facility] [phone] [address] [place], [place], [place] [address] [place]  [facility] of  [place] [phone] [address] [place], [place]  [address] Mickie Hillier, [place], [place], [place], [place], Leamon Arnt, [facility] [phone]  [address] [place], [place]  [address] Cecil Cobbs, Etheleen Sia  [facility] [phone] [address] [place], [place]  [address] Sydnee Levans   [facility] [phone] [address] [place], [place] [address] Lucillie Garfinkel, [facility]  [facility] [phone]  [address], [address] [place], [place]  [address] [place], [place], [place], [place], [place], [place], [place], [place], [place], [place], [place], Erskine Emery, Leamon Arnt [facility]  [facility] [phone] [address], [place], [place] [address] Meryle Ready PHONE/FAX ADDRESS Nila Nephew [facility] [phone] [address] [place], [place]  [address] [place]  [facility]. [phone] [address] [place] [employer], [place]  [address] [place], [place], [place], [place], [place], [place], [place], [place], [place], [place], [facility]  [employer] [phone] [address] [place], [place] [address] [facility] [phone] [address] [place], [place], [place] [address] Sydnee Levans  [facility] [phone] [address] [place], [place], [place], Lonia Skinner  [facility] [phone]  [address], [place], [place] [address] [facility] [phone] or  [phone]  [address] [place], [place]  [address] [place], Haynes Bast, Duke Salvia, Aaron Edelman  [employer] [phone] Fax  [phone] [address]a [address]  [address] [place], [place]   [place], [place], [place] Prov[facility]  [phone]  Fax [phone] [address] [place], [place] [address] Cristie Hem, [facility] [phone] [address] [place], [place]  [address] [facility] [phone] Fax [phone] (same) [address] [place], [place] [address] Marguarite Arbour  [facility] [phone] Fax [phone] [address] [place], [place]  [address]  [place], [facility] Instead [facility] [phone]  [address] [place], [place]  [address] [facility] [phone]  [address] [place], [place] [address] Lorenza Chick  [facility] Caregivers [phone]  [address] [place], [place]  [address] [place], Alona Bene, Duke Salvia, [place]  [facility] [phone]  [address] [place], [place] [address] Thomes [facility] [phone] [address], [place], [place] [address] Jon Gills, Lonia Skinner  [facility] [phone] Fax [phone] [address] [place], [place]  [address] Judieth Keens [phone]  [address] [place], [place]  [address] [place], Carolin [place]ern[place], Jacob Moores    [facility] [phone] [address] [place], [place]  [address] Jon Gills, Gala Murdoch, [facility]  [facility]. [phone] [address] [place], [place]  [address] [place], Carolin [place]ernsey, Lonia Skinner  [facility] [place] [facility] [phone]  [address] [place], [place]  [address] Rubye Oaks   [facility] [phone] [address], [place], [place] [address] Noel Gerold, [facility]  [facility] [phone] [address] [place], [place]  [address] [place], [place], Cecil Cobbs, [place], Octaviano Batty  [facility] [phone]  [address], [place], [place] [address] Rolinda Roan, Duke Salvia   [facility] [phone] [address] [place], [place]  [address] [place],  Valla Leaver, [place]  [facility] Assistance [phone]  [address] [place], [place]  [address] Theotis Barrio  [facility] [phone] [address] [place], [place]  [address] Andi Hence, Gala Murdoch, [place]  [facility] [employer] [phone]  [address] [place], [place]  [address] [facility] [phone] [address] [place], [place], [place] [address] [facility]  [facility] [phone]  [address] [place], [place]  [address] Roselyn Reef  [facility] [phone] [address] [place], [place] [address] [place], Joesph Fillers  [facility] [phone]  [address] [place], [place] [address] Hermenia Fiscal, Berton Lan, Haynes Bast, Duke Salvia   *The agencies in bold have been recently verified as active agencies (12/2022).

## 2023-08-08 NOTE — Procedures (Signed)
PROCEDURE SUMMARY:  Successful US guided paracentesis from right lateral abdomen.  Yielded 2.3 liters of yellow fluid.  No immediate complications.  Pt tolerated well.   Specimen was sent for labs.  EBL < 5mL  Hoyt Koch PA-C 08/08/2023 1:16 PM

## 2023-08-08 NOTE — Plan of Care (Signed)
  Problem: Elimination: Goal: Will not experience complications related to bowel motility Outcome: Progressing   Problem: Tissue Perfusion: Goal: Adequacy of tissue perfusion will improve Outcome: Progressing

## 2023-08-08 NOTE — Progress Notes (Signed)
Speech Language Pathology Treatment: Dysphagia  Patient Details Name: Dana Dyer MRN: [id number] DOB: 23-Jan-1951 Today's Date: 08/08/2023 Time: 3500-9381 SLP Time Calculation (min) (ACUTE ONLY): 9 min  Assessment / Plan / Recommendation Clinical Impression  Pt was sitting upright in her chair, still with some AMS although improving per review of chart. She denies any trouble swallowing, and although she did not take much PO from SLP, she did not show overt signs of dysphagia during consumption. Several minutes after PO intake had stopped she had a single cough - unclear if this would be related to swallowing, but given no other signs of difficulty and pt's improving mentation, would leave on regular consistencies and thin liquids. Supervision with meals may help with safety and could provide encouragement for more intake.   HPI HPI: Pt is a 72 yo female presenting 8/31 with AMS suspected to be acute hepatic encephalopathy. PMH includes: GERD, Elita Boone cirrhosis, chronic ascites, HTN, hepatic encephalopathy, multiple cystic pancreatic lesion, hemorrhoid s/p banding, DM II, generalized anxiety disorder      SLP Plan  Continue with current plan of care      Recommendations for follow up therapy are one component of a multi-disciplinary discharge planning process, led by the attending physician.  Recommendations may be updated based on patient status, additional functional criteria and insurance authorization.    Recommendations  Diet recommendations: Regular;Thin liquid Liquids provided via: Cup;Straw Medication Administration: Whole meds with liquid Supervision: Staff to assist with self feeding Compensations: Minimize environmental distractions;Slow rate;Small sips/bites Postural Changes and/or Swallow Maneuvers: Seated upright 90 degrees                  Oral care BID     Dysphagia, unspecified (R13.10)     Continue with current plan of care     Mahala Menghini., M.A.  CCC-SLP Acute Rehabilitation Services Office [phone]  Secure chat preferred   08/08/2023, 11:04 AM

## 2023-08-08 NOTE — Telephone Encounter (Signed)
This message is from patient's husband regarding his own health concerns. Message has been added in his chart to be addressed as appropriate.

## 2023-08-09 ENCOUNTER — Inpatient Hospital Stay (HOSPITAL_COMMUNITY): Payer: Medicare PPO

## 2023-08-09 DIAGNOSIS — K7581 Nonalcoholic steatohepatitis (NASH): Secondary | ICD-10-CM | POA: Diagnosis not present

## 2023-08-09 DIAGNOSIS — K7682 Hepatic encephalopathy: Secondary | ICD-10-CM | POA: Diagnosis not present

## 2023-08-09 DIAGNOSIS — Z515 Encounter for palliative care: Secondary | ICD-10-CM

## 2023-08-09 DIAGNOSIS — Z7189 Other specified counseling: Secondary | ICD-10-CM | POA: Diagnosis not present

## 2023-08-09 DIAGNOSIS — J189 Pneumonia, unspecified organism: Secondary | ICD-10-CM | POA: Insufficient documentation

## 2023-08-09 DIAGNOSIS — K7460 Unspecified cirrhosis of liver: Secondary | ICD-10-CM | POA: Diagnosis not present

## 2023-08-09 DIAGNOSIS — J9601 Acute respiratory failure with hypoxia: Secondary | ICD-10-CM | POA: Insufficient documentation

## 2023-08-09 DIAGNOSIS — K767 Hepatorenal syndrome: Secondary | ICD-10-CM | POA: Insufficient documentation

## 2023-08-09 DIAGNOSIS — N179 Acute kidney failure, unspecified: Secondary | ICD-10-CM | POA: Diagnosis not present

## 2023-08-09 LAB — CBC WITH DIFFERENTIAL/PLATELET
Abs Immature Granulocytes: 0.06 10*3/uL (ref 0.00–0.07)
Basophils Absolute: 0 10*3/uL (ref 0.0–0.1)
Basophils Relative: 0 %
Eosinophils Absolute: 0.1 10*3/uL (ref 0.0–0.5)
Eosinophils Relative: 1 %
HCT: 23.8 % — ABNORMAL LOW (ref 36.0–46.0)
Hemoglobin: 7.5 g/dL — ABNORMAL LOW (ref 12.0–15.0)
Immature Granulocytes: 1 %
Lymphocytes Relative: 12 %
Lymphs Abs: 1 10*3/uL (ref 0.7–4.0)
MCH: 26.5 pg (ref 26.0–34.0)
MCHC: 31.5 g/dL (ref 30.0–36.0)
MCV: 84.1 fL (ref 80.0–100.0)
Monocytes Absolute: 0.8 10*3/uL (ref 0.1–1.0)
Monocytes Relative: 11 %
Neutro Abs: 5.9 10*3/uL (ref 1.7–7.7)
Neutrophils Relative %: 75 %
Platelets: 61 10*3/uL — ABNORMAL LOW (ref 150–400)
RBC: 2.83 MIL/uL — ABNORMAL LOW (ref 3.87–5.11)
RDW: 17.3 % — ABNORMAL HIGH (ref 11.5–15.5)
WBC: 7.9 10*3/uL (ref 4.0–10.5)
nRBC: 0 % (ref 0.0–0.2)

## 2023-08-09 LAB — C-REACTIVE PROTEIN: CRP: 2.2 mg/dL — ABNORMAL HIGH (ref <1.0)

## 2023-08-09 LAB — MAGNESIUM: Magnesium: 1.8 mg/dL (ref 1.7–2.4)

## 2023-08-09 LAB — COMPREHENSIVE METABOLIC PANEL
ALT: 34 U/L (ref 0–44)
AST: 35 U/L (ref 15–41)
Albumin: 3.9 g/dL (ref 3.5–5.0)
Alkaline Phosphatase: 73 U/L (ref 38–126)
Anion gap: 15 (ref 5–15)
BUN: 39 mg/dL — ABNORMAL HIGH (ref 8–23)
CO2: 17 mmol/L — ABNORMAL LOW (ref 22–32)
Calcium: 8.1 mg/dL — ABNORMAL LOW (ref 8.9–10.3)
Chloride: 101 mmol/L (ref 98–111)
Creatinine, Ser: 1.7 mg/dL — ABNORMAL HIGH (ref 0.44–1.00)
GFR, Estimated: 31 mL/min — ABNORMAL LOW (ref >60)
Glucose, Bld: 176 mg/dL — ABNORMAL HIGH (ref 70–99)
Potassium: 3.7 mmol/L (ref 3.5–5.1)
Sodium: 133 mmol/L — ABNORMAL LOW (ref 135–145)
Total Bilirubin: 2.5 mg/dL — ABNORMAL HIGH (ref 0.3–1.2)
Total Protein: 5.7 g/dL — ABNORMAL LOW (ref 6.5–8.1)

## 2023-08-09 LAB — PROCALCITONIN: Procalcitonin: 0.6 ng/mL

## 2023-08-09 LAB — BRAIN NATRIURETIC PEPTIDE: B Natriuretic Peptide: 1144.6 pg/mL — ABNORMAL HIGH (ref 0.0–100.0)

## 2023-08-09 LAB — PATHOLOGIST SMEAR REVIEW

## 2023-08-09 LAB — GLUCOSE, CAPILLARY
Glucose-Capillary: 154 mg/dL — ABNORMAL HIGH (ref 70–99)
Glucose-Capillary: 185 mg/dL — ABNORMAL HIGH (ref 70–99)

## 2023-08-09 MED ORDER — BIOTENE DRY MOUTH MT LIQD: 15.0000 mL | OROMUCOSAL | Status: DC | PRN

## 2023-08-09 MED ORDER — ALBUTEROL SULFATE (2.5 MG/3ML) 0.083% IN NEBU
2.5000 mg | INHALATION_SOLUTION | RESPIRATORY_TRACT | Status: DC | PRN
  Administered 2023-08-09: 2.5 mg via RESPIRATORY_TRACT
  Filled 2023-08-09: qty 3

## 2023-08-09 MED ORDER — GLYCOPYRROLATE 0.2 MG/ML IJ SOLN
0.2000 mg | INTRAMUSCULAR | Status: DC | PRN
  Administered 2023-08-09: 0.2 mg via INTRAVENOUS
  Filled 2023-08-09: qty 1

## 2023-08-09 MED ORDER — SODIUM CHLORIDE 0.9 % IV SOLN
1.5000 g | Freq: Two times a day (BID) | INTRAVENOUS | Status: DC
  Administered 2023-08-09: 1.5 g via INTRAVENOUS
  Filled 2023-08-09 (×2): qty 4

## 2023-08-09 MED ORDER — HYDROMORPHONE HCL 1 MG/ML IJ SOLN
0.5000 mg | Freq: Once | INTRAMUSCULAR | Status: AC
  Administered 2023-08-09: 0.5 mg via INTRAVENOUS
  Filled 2023-08-09: qty 0.5

## 2023-08-09 MED ORDER — LORAZEPAM 2 MG/ML IJ SOLN: 0.5000 mg | Freq: Four times a day (QID) | INTRAMUSCULAR | Status: DC | PRN

## 2023-08-09 MED ORDER — ALPRAZOLAM 0.25 MG PO TABS
0.2500 mg | ORAL_TABLET | Freq: Four times a day (QID) | ORAL | Status: DC | PRN
  Administered 2023-08-09: 0.25 mg via ORAL
  Filled 2023-08-09: qty 1

## 2023-08-09 MED ORDER — LORAZEPAM 2 MG/ML IJ SOLN
0.5000 mg | Freq: Once | INTRAMUSCULAR | Status: AC
  Administered 2023-08-09: 0.5 mg via INTRAVENOUS
  Filled 2023-08-09: qty 1

## 2023-08-09 MED ORDER — HYDROMORPHONE BOLUS VIA INFUSION: 0.5000 mg | INTRAVENOUS | Status: DC | PRN

## 2023-08-09 MED ORDER — HYDROMORPHONE HCL-NACL 50-0.9 MG/50ML-% IV SOLN
1.0000 mg/h | INTRAVENOUS | Status: DC
  Administered 2023-08-09: 1 mg/h via INTRAVENOUS
  Filled 2023-08-09: qty 50

## 2023-08-09 MED ORDER — POLYVINYL ALCOHOL 1.4 % OP SOLN: 1.0000 [drp] | Freq: Four times a day (QID) | OPHTHALMIC | Status: DC | PRN

## 2023-08-09 MED ORDER — GLYCOPYRROLATE 0.2 MG/ML IJ SOLN: 0.2000 mg | INTRAMUSCULAR | Status: DC | PRN

## 2023-08-09 MED ORDER — GLYCOPYRROLATE 1 MG PO TABS: 1.0000 mg | ORAL_TABLET | ORAL | Status: DC | PRN

## 2023-08-11 LAB — CULTURE, BLOOD (ROUTINE X 2)
Culture: NO GROWTH
Culture: NO GROWTH
Special Requests: ADEQUATE

## 2023-08-13 LAB — CULTURE, BODY FLUID W GRAM STAIN -BOTTLE: Culture: NO GROWTH

## 2023-08-14 ENCOUNTER — Ambulatory Visit: Payer: Medicare PPO | Admitting: Family Medicine

## 2023-08-15 ENCOUNTER — Encounter: Payer: Medicare PPO | Admitting: Family Medicine

## 2023-08-20 ENCOUNTER — Other Ambulatory Visit: Payer: Medicare PPO

## 2023-09-05 NOTE — Significant Event (Signed)
TRH Night coverage note:  Pt seen and evaluated at bedside.  Pt with new onset desatting, requiring HFNC, wheezing, prolonged expiratory phase on exam and sparse crackles diffusely.  1+ BLE pitting edema.  HR 110, BP 91/66.  O2 sat improved for the moment, but on HFNC RRT giving albuterol treatment for wheezing CXR ordered STAT DDx at this point most likely PNA vs CHF Start BIPAP if needed AM labs ordered and pending including procalcitonin, BNP BNP trending up over past 3 days

## 2023-09-05 NOTE — Progress Notes (Addendum)
[facility] Progress Note  CC:  Decompensated MASH cirrhosis   Subjective: She denies having any nausea or vomiting.  No abdominal pain.  She is on oxygen 13 L nasal cannula at this time, denies productive cough or shortness of breath.  No chest pain.  He endorsed passing 4-5 loose brown stools yesterday, no BM yet today.  No rectal bleeding or black stools.  No family at the bedside.   Objective:  Vital signs in last 24 hours: Temp:  [97.7 F (36.5 C)-98.7 F (37.1 C)] 98.7 F (37.1 C) (09/04 0700) Pulse Rate:  [83-109] 109 (09/04 0700) Resp:  [14-20] 20 (09/04 0700) BP: (95-121)/(49-70) 108/49 (09/04 0700) SpO2:  [90 %-99 %] 95 % (09/04 0700) Weight:  [68.9 kg] 68.9 kg (09/04 0510) Last BM Date : 08/08/23 General: 72 year old female awake and conversant. Eyes: No scleral icterus. Heart: Mildly tachycardic, no murmurs. Pulm: Coarse breath sounds on inspiration and expiration, decreased in the bases.  Increased respiratory effort with use of accessory muscles. Abdomen: Obese abdomen, distended with ascites, abdomen is not tense.  Nontender.  Positive bowel sounds all 4 quadrants.  Umbilical hernia present. Extremities: No lower extremity edema. Neurologic:  Alert to name and place, stated the year was 1950. Hands mildly tremulous without asterixis.  Psych:  Alert and cooperative.  Intake/Output from previous day: No intake/output data recorded. Intake/Output this shift: No intake/output data recorded.  Lab Results: Recent Labs    08/07/23 0329 08/08/23 0637 08/28/2023 0635  WBC 5.0 5.2 7.9  HGB 8.9* 7.9* 7.5*  HCT 27.2* 25.2* 23.8*  PLT 83* 54* 61*   BMET Recent Labs    08/07/23 0329 08/08/23 0637 08/23/2023 0635  NA 134* 134* 133*  K 3.9 3.2* 3.7  CL 100 103 101  CO2 19* 19* 17*  GLUCOSE 171* 144* 176*  BUN 44* 38* 39*  CREATININE 2.09* 1.45* 1.70*  CALCIUM 8.6* 8.3* 8.1*   LFT Recent Labs    09/02/2023 0635  PROT 5.7*  ALBUMIN 3.9  AST  35  ALT 34  ALKPHOS 73  BILITOT 2.5*   PT/INR Recent Labs    08/08/23 0637  LABPROT 18.8*  INR 1.6*   Hepatitis Panel No results for input(s): "HEPBSAG", "HCVAB", "HEPAIGM", "HEPBIGM" in the last 72 hours.   MELD 3.0: 24 at 08/15/2023  6:35 AM MELD-Na: 23 at 08/17/2023  6:35 AM Calculated from: Serum Creatinine: 1.70 mg/dL at 07/06/9561  1:30 AM Serum Sodium: 133 mmol/L at 08/08/2023  6:35 AM Total Bilirubin: 2.5 mg/dL at 07/11/5783  6:96 AM Serum Albumin: 3.9 g/dL (Using max of 3.5 g/dL) at 01/13/5283  1:32 AM INR(ratio): 1.6 at 08/08/2023  6:37 AM Age at listing (hypothetical): 75 years Sex: Female at 08/30/2023  6:35 AM  DG CHEST PORT 1 VIEW  Result Date: 08/15/2023 CLINICAL DATA:  200808 with hypoxia. EXAM: PORTABLE CHEST 1 VIEW COMPARISON:  Portable chest 08/07/2023 at 6:40 a.m. FINDINGS: 6:03 a.m. Patchy airspace disease has developed throughout the lung fields with relative left apical sparing. Findings most likely due to widespread pneumonia, less likely airspace edema. Minimal pleural effusions have also formed. The cardiac size remains normal. The mediastinum is normally outlined. No new osseous findings. Thoracic spondylosis. IMPRESSION: Interval development of patchy airspace disease throughout the lung fields with relative left apical sparing. Findings most likely due to widespread pneumonia, less likely airspace edema. Electronically Signed   By: Almira Bar M.D.   On: 08/26/2023 06:26   IR Paracentesis  Result  Date: 08/08/2023 INDICATION: NASH cirrhosis, recurrent ascites EXAM: ULTRASOUND GUIDED DIAGNOSTIC AND THERAPEUTIC PARACENTESIS MEDICATIONS: 10 mL 1% lidocaine COMPLICATIONS: None immediate. PROCEDURE: Informed written consent was obtained from the patient after a discussion of the risks, benefits and alternatives to treatment. A timeout was performed prior to the initiation of the procedure. Initial ultrasound scanning demonstrates a small amount of ascites within the right  lower abdominal quadrant. The right lower abdomen was prepped and draped in the usual sterile fashion. 1% lidocaine was used for local anesthesia. Following this, a 19 gauge, 10-cm, Yueh catheter was introduced. An ultrasound image was saved for documentation purposes. The paracentesis was performed. The catheter was removed and a dressing was applied. The patient tolerated the procedure well without immediate post procedural complication. Patient received post-procedure intravenous albumin; see nursing notes for details. FINDINGS: A total of approximately 2.3 liters of yellow fluid was removed. Samples were sent to the laboratory as requested by the clinical team. IMPRESSION: Successful ultrasound-guided paracentesis yielding 2.3 liters of peritoneal fluid. This exam was performed by Loyce Dys PA-C. Electronically Signed   By: Judie Petit.  Shick M.D.   On: 08/08/2023 13:45   [place] ASCITES (ABDOMEN LIMITED)  Result Date: 08/07/2023 CLINICAL DATA:  72 year old female with suspected ascites. EXAM: LIMITED ABDOMEN ULTRASOUND FOR ASCITES TECHNIQUE: Limited ultrasound survey for ascites was performed in all four abdominal quadrants. COMPARISON:  Abdominal ultrasound 08/06/2023. FINDINGS: Moderate volume of free fluid noted in the abdomen and pelvis in all 4 quadrants, slightly increased compared to the recent prior examination. Liver has a shrunken appearance and nodular contour, indicative of advanced cirrhosis. IMPRESSION: 1. Advanced cirrhosis with moderate volume of ascites, increased compared to the prior examination. Electronically Signed   By: Trudie Reed M.D.   On: 08/07/2023 13:12    Assessment / Plan:  72 year old female with decompensated MASH cirrhosis with ascites, esophageal varices, hypertensive portal hypertensive gastropathy, hyponatremia and hepatic encephalopathy admitted to the hospital 07/14/2023 with N/V/D, worsening hepatic encephalopathy with AKI - HRS type I.  On Albumin IV and Midodrine.  Terlipressin was considered by nephrology, not initiated. Cr 1.45 -> 1.70. T. Bili 2.5. Normal Alk phos, AST/ALT levels.  MELD 3: 24 per labs 9/4. S/P paracentesis 8/27 (2.6L peritoneal fluid removed) and 9/3 ( 2.3 peritoneal fluid removed) without SBP. -Continue Midodrine 5mg  po tid -Continue Rifaximin 550mg  po bid -Off diuretics  -Continue Pantoprazole 40 mg p.o. daily -? Continue Rocephin IV Q 24 hrs for a total of 5 days -2 g low-sodium diet -Started on Dilaudid infusion per palliative care -Await further recommendations per Dr. Adela Lank  Hyponatremia secondary to cirrhosis and diuretics. Na+ 133.   Thrombocytopenia secondary to cirrhosis and mild splenomegaly. PLT 61.   Acute on chronic anemia, secondary to cirrhosis. Hg 7.9 -> 7.5.  -Transfuse PRBC for Hg < 7  Pneumonia. Chest xray showed interval development of patchy airspace disease throughout the lung fields with relative left apical sparing consistent with widespread pneumonia. On Unasyn IV. On oxygen 13L [place]. Modified barium swallow study ordered to assess aspiration risk.  -Management per the hospitalist   Principal Problem:   Acute hepatic encephalopathy (HCC) Active Problems:   Type 2 diabetes mellitus without complication, with long-term current use of insulin (HCC)   Essential hypertension   Thrombocytopenia (HCC)   Liver cirrhosis secondary to NASH (HCC)   Dehydration   AKI (acute kidney injury) (HCC)   GERD (gastroesophageal reflux disease)   Metabolic acidosis   Dysphagia   GAD (generalized  anxiety disorder)   Normocytic anemia   Acute renal failure (HCC)     LOS: 3 days   Arnaldo Natal  08/22/2023, 10:55AM

## 2023-09-05 NOTE — Progress Notes (Signed)
Hydromorphone (Dilaudid) 50 mg in 50 mL NS - WASTED 45 mL  Second RN Witness : Gertha Calkin, RN

## 2023-09-05 NOTE — Progress Notes (Signed)
Patient ID: Dana Dyer, female   DOB: November 21, 1951, 72 y.o.   MRN: [id number]  Overnight clinical events reviewed from chart and the plans (as confirmed from the patient's daughter and son-in-law) are noted for transition to comfort care measures at this time. Nephrology will sign off and remain available for questions/concerns.   [name] MD [facility]. Office # [phone] Pager # [phone] 11:58 AM

## 2023-09-05 NOTE — Plan of Care (Signed)
Family educated and in agreement with care plan. Pt comfort care on dilaudid gtt. Problem: Education: Goal: Knowledge of General Education information will improve Description: Including pain rating scale, medication(s)/side effects and non-pharmacologic comfort measures Outcome: Progressing   Problem: Health Behavior/Discharge Planning: Goal: Ability to manage health-related needs will improve Outcome: Progressing   Problem: Clinical Measurements: Goal: Ability to maintain clinical measurements within normal limits will improve Outcome: Progressing Goal: Will remain free from infection Outcome: Progressing Goal: Diagnostic test results will improve Outcome: Progressing Goal: Respiratory complications will improve Outcome: Progressing Goal: Cardiovascular complication will be avoided Outcome: Progressing   Problem: Activity: Goal: Risk for activity intolerance will decrease Outcome: Progressing   Problem: Nutrition: Goal: Adequate nutrition will be maintained Outcome: Progressing   Problem: Coping: Goal: Level of anxiety will decrease Outcome: Progressing   Problem: Elimination: Goal: Will not experience complications related to bowel motility Outcome: Progressing Goal: Will not experience complications related to urinary retention Outcome: Progressing   Problem: Pain Managment: Goal: General experience of comfort will improve Outcome: Progressing   Problem: Safety: Goal: Ability to remain free from injury will improve Outcome: Progressing   Problem: Skin Integrity: Goal: Risk for impaired skin integrity will decrease Outcome: Progressing   Problem: Education: Goal: Ability to describe self-care measures that may prevent or decrease complications (Diabetes Survival Skills Education) will improve Outcome: Progressing Goal: Individualized Educational Video(s) Outcome: Progressing   Problem: Coping: Goal: Ability to adjust to condition or change in health will  improve Outcome: Progressing   Problem: Fluid Volume: Goal: Ability to maintain a balanced intake and output will improve Outcome: Progressing   Problem: Health Behavior/Discharge Planning: Goal: Ability to identify and utilize available resources and services will improve Outcome: Progressing Goal: Ability to manage health-related needs will improve Outcome: Progressing   Problem: Metabolic: Goal: Ability to maintain appropriate glucose levels will improve Outcome: Progressing   Problem: Nutritional: Goal: Maintenance of adequate nutrition will improve Outcome: Progressing Goal: Progress toward achieving an optimal weight will improve Outcome: Progressing   Problem: Skin Integrity: Goal: Risk for impaired skin integrity will decrease Outcome: Progressing   Problem: Tissue Perfusion: Goal: Adequacy of tissue perfusion will improve Outcome: Progressing   Problem: Education: Goal: Knowledge of the prescribed therapeutic regimen will improve Outcome: Progressing   Problem: Coping: Goal: Ability to identify and develop effective coping behavior will improve Outcome: Progressing   Problem: Clinical Measurements: Goal: Quality of life will improve Outcome: Progressing   Problem: Respiratory: Goal: Verbalizations of increased ease of respirations will increase Outcome: Progressing   Problem: Role Relationship: Goal: Family's ability to cope with current situation will improve Outcome: Progressing Goal: Ability to verbalize concerns, feelings, and thoughts to partner or family member will improve Outcome: Progressing   Problem: Pain Management: Goal: Satisfaction with pain management regimen will improve Outcome: Progressing

## 2023-09-05 NOTE — Progress Notes (Signed)
PROGRESS NOTE                                                                                                                                                                                                             Patient Demographics:    Dana Dyer, is a 72 y.o. female, DOB - 1951/01/08, NGE:[id number]  Outpatient Primary MD for the patient is Karie Georges, MD    LOS - 3  Admit date - 07/07/2023    Chief Complaint  Patient presents with   Altered Mental Status       Brief Narrative (HPI from H&P)     72 y.o. female with medical history significant of Elita Boone cirrhosis, chronic ascites, hypertension, hepatic encephalopathy, GERD, multiple cystic pancreatic lesion, GERD, hemorrhoid s/p banding, insulin dependent DM type II and generalized anxiety disorder presented to emergency department for evaluation for altered mental status.  Further workup showed that she was severely dehydrated >>  AKI >> relative insulin overdose due to AKI,  hypoglycemia and hypothermia.  She was admitted for metabolic encephalopathy caused by combination of AKI, dehydration, hypoglycemia and mildly elevated ammonia levels.  As well patient developed acute hypoxic respiratory failure, secondary to aspiration for pneumonia, palliative medicine has been involved, he has been transition to comfort care 9/4  Subjective:   With increased oxygen requirement overnight, she reports dyspnea, cough as well.  Assessment  & Plan :     - Severe dehydration causing AKI, due to AKI insulin buildup causing hypoglycemia, dehydration and hypoperfusion induced hypothermia  - AKI with metabolic acidosis,  hepatorenal syndrome.    - Metabolic encephalopathy due to combination of severe dehydration, AKI, hypoglycemia and mildly elevated ammonia/hepatic encephalopathy.   - History of left liver lobe hypodensity and multiple pancreatic cysts.  Following with  [facility].  Due for outpatient MRCP on 08/20/2023.  History of dysphagia.    Hyponatremia  NASH -decompensated liver failure  GERD -   Anemia of chronic disease.    DM type II.    Acute hypoxic respiratory failure/aspiration pneumonia: Developed increased work of breathing, worsening hypoxia up to 12 L nasal cannula this morning, chest x-ray significant for multifocal pneumonia, concern for aspiration pneumonia, she was treated with IV Unasyn prior to transitioning to full comfort.  Goals of care discussion: -I have discussed with daughter at  length at bedside, with patient as well, they understand overall poor prognosis, palliative medicine closely involved, discussed with the family multiple times today, patient has been transitioned to full comfort measures       Condition -comfort care  Family Communication  :   Cussed with daughter at bedside  Code Status : DNR  Consults  : [facility], nephrology, palliative care, IR  PUD Prophylaxis : PPI   Procedures  :     Abd [place] -  Small to moderate free fluid, significantly decreased since prior study      Disposition Plan  :    Status is: Inpatient   DVT Prophylaxis  :    SCDs Start: 08/06/23 0123    Lab Results  Component Value Date   PLT 61 (L) 09/01/2023    Diet :  Diet Order             Diet regular Room service appropriate? Yes; Fluid consistency: Thin  Diet effective now                    Inpatient Medications  Scheduled Meds:   Continuous Infusions:  HYDROmorphone 1 mg/hr (08/23/2023 1124)   PRN Meds:.acetaminophen **OR** acetaminophen, antiseptic oral rinse, glycopyrrolate **OR** glycopyrrolate **OR** glycopyrrolate, haloperidol lactate, HYDROmorphone, LORazepam, [DISCONTINUED] ondansetron **OR** ondansetron (ZOFRAN) IV, polyvinyl alcohol  Antibiotics  :    Anti-infectives (From admission, onward)    Start     Dose/Rate Route Frequency Ordered Stop   08/18/2023 0945   ampicillin-sulbactam (UNASYN) 1.5 g in sodium chloride 0.9 % 100 mL IVPB  Status:  Discontinued        1.5 g 200 mL/hr over 30 Minutes Intravenous Every 12 hours 08/16/2023 0850 08/22/2023 1145   08/06/23 1130  cefTRIAXone (ROCEPHIN) 1 g in sodium chloride 0.9 % 100 mL IVPB  Status:  Discontinued        1 g 200 mL/hr over 30 Minutes Intravenous Every 24 hours 08/06/23 1038 08/24/2023 0850   08/06/23 1000  rifaximin (XIFAXAN) tablet 550 mg  Status:  Discontinued        550 mg Oral 2 times daily 08/06/23 0122 08/07/2023 1030         Objective:   Vitals:   08/08/2023 0700 08/21/2023 0937 08/11/2023 1158 08/25/2023 1200  BP: (!) 108/49     Pulse: (!) 109     Resp: 20     Temp: 98.7 F (37.1 C)     TempSrc: Oral     SpO2: 95% 90% (!) 85% (!) 83%  Weight:      Height:        Wt Readings from Last 3 Encounters:  08/22/2023 68.9 kg  07/13/23 74.2 kg  07/10/23 72.1 kg     Intake/Output Summary (Last 24 hours) at 08/08/2023 1236 Last data filed at 09/04/2023 1047 Gross per 24 hour  Intake 222.2 ml  Output --  Net 222.2 ml     Physical Exam  Awake, appears uncomfortable due to dyspnea and increased work of breathing Symmetrical Chest wall movement, diffuse Rales bilaterally Tachycardic,No Gallops,Rubs or new Murmurs, No Parasternal Heave +ve B.Sounds, Abd Soft, No tenderness, No rebound - guarding or rigidity. No Cyanosis, Clubbing or edema, No new Rash or bruise      Recent Labs  Lab 07/17/2023 2156 08/06/23 0300 08/07/23 0329 08/08/23 0637 08/15/2023 0635  WBC 10.3 6.7 5.0 5.2 7.9  HGB 9.6* 8.5* 8.9* 7.9* 7.5*  HCT 30.4* 26.6* 27.2* 25.2* 23.8*  PLT 110* 84* 83* 54* 61*  MCV 83.3 82.6 82.7 84.0 84.1  MCH 26.3 26.4 27.1 26.3 26.5  MCHC 31.6 32.0 32.7 31.3 31.5  RDW 17.1* 17.2* 17.2* 17.6* 17.3*  LYMPHSABS 0.9  --  0.6* 0.8 1.0  MONOABS 1.3*  --  0.6 0.7 0.8  EOSABS 0.3  --  0.2 0.3 0.1  BASOSABS 0.1  --  0.0 0.0 0.0    Recent Labs  Lab 07/07/2023 2156 07/24/2023 2207  07/25/2023 2244 08/06/23 0010 08/06/23 0300 08/06/23 0546 08/06/23 0755 08/06/23 1017 08/07/23 0329 08/07/23 1023 08/08/23 0637 08/21/2023 0635  NA 132*  --   --   --  135  --   --   --  134*  --  134* 133*  K 3.6  --   --   --  3.1*  --   --   --  3.9  --  3.2* 3.7  CL 100  --   --   --  103  --   --   --  100  --  103 101  CO2 16*  --   --   --  16*  --   --   --  19*  --  19* 17*  ANIONGAP 16*  --   --   --  16*  --   --   --  15  --  12 15  GLUCOSE 83  --   --   --  161*  --  62*  --  171*  --  144* 176*  BUN 49*  --   --   --  49*  --   --   --  44*  --  38* 39*  CREATININE 2.26*  --   --   --  2.06*  --   --   --  2.09*  --  1.45* 1.70*  AST 65*  --   --   --  53*  --   --   --  54*  --  41 35  ALT 77*  --   --   --  63*  --   --   --  60*  --  41 34  ALKPHOS 120  --   --   --  101  --   --   --  106  --  78 73  BILITOT 2.1*  --   --   --  2.2*  --   --   --  2.4*  --  2.4* 2.5*  ALBUMIN 2.6*  --   --   --  2.2*  --   --   --  3.2*  --  3.5 3.9  CRP  --   --   --   --   --   --   --  2.0* 2.0*  --  1.8* 2.2*  PROCALCITON  --   --   --   --   --  0.15  --   --  0.88  --  0.60 0.60  LATICACIDVEN  --   --  1.7  --   --   --   --   --   --   --   --   --   INR  --   --   --  1.2  --   --   --   --   --   --  1.6*  --   TSH  --   --   --   --   --  1.321  --   --  1.697  --   --   --   HGBA1C  --   --   --   --   --  6.3*  --   --   --   --   --   --   AMMONIA  --  49*  --   --   --  69*  --   --   --  53*  --   --   BNP  --   --   --   --   --  70.9  --   --  260.5*  --  494.4* 1,144.6*  MG  --   --   --   --   --  1.7  --   --  1.7  --  1.9 1.8  CALCIUM 8.7*  --   --   --  8.0*  --   --   --  8.6*  --  8.3* 8.1*      Recent Labs  Lab 07/08/2023 2156 07/22/2023 2207 08/04/2023 2244 08/06/23 0010 08/06/23 0300 08/06/23 0546 08/06/23 1017 08/07/23 0329 08/07/23 1023 08/08/23 0637 09/01/2023 0635  CRP  --   --   --   --   --   --  2.0* 2.0*  --  1.8* 2.2*  PROCALCITON  --   --    --   --   --  0.15  --  0.88  --  0.60 0.60  LATICACIDVEN  --   --  1.7  --   --   --   --   --   --   --   --   INR  --   --   --  1.2  --   --   --   --   --  1.6*  --   TSH  --   --   --   --   --  1.321  --  1.697  --   --   --   HGBA1C  --   --   --   --   --  6.3*  --   --   --   --   --   AMMONIA  --  49*  --   --   --  69*  --   --  53*  --   --   BNP  --   --   --   --   --  70.9  --  260.5*  --  494.4* 1,144.6*  MG  --   --   --   --   --  1.7  --  1.7  --  1.9 1.8  CALCIUM 8.7*  --   --   --  8.0*  --   --  8.6*  --  8.3* 8.1*    --------------------------------------------------------------------------------------------------------------- Lab Results  Component Value Date   CHOL 154 09/30/2021   HDL 59.30 09/30/2021   LDLCALC 69 09/30/2021   LDLDIRECT 124.9 10/14/2010   TRIG 128.0 09/30/2021   CHOLHDL 3 09/30/2021    Lab Results  Component Value Date   HGBA1C 6.3 (H) 08/06/2023   Recent Labs    08/07/23 0329  TSH 1.697       Micro Results Recent Results (from the past 240 hour(s))  Culture, body fluid w Gram Stain-bottle     Status: None   Collection Time: 08/01/23 11:05 AM   Specimen:  Peritoneal Washings  Result Value Ref Range Status   Specimen Description PERITONEAL  Final   Special Requests NONE  Final   Culture   Final    NO GROWTH 5 DAYS Performed at [facility], [address] [place], [place] [address]    Report Status 08/06/2023 FINAL  Final  Gram stain     Status: None   Collection Time: 08/01/23 11:05 AM   Specimen: Peritoneal Washings  Result Value Ref Range Status   Specimen Description PERITONEAL  Final   Special Requests NONE  Final   Gram Stain   Final    NO WBC SEEN NO ORGANISMS SEEN Performed at [facility], [address] [place], [place] [address]    Report Status 08/01/2023 FINAL  Final  Culture, blood (Routine X 2) w Reflex to ID Panel     Status: None (Preliminary result)   Collection Time: 08/06/23  2:50 AM    Specimen: BLOOD  Result Value Ref Range Status   Specimen Description BLOOD RIGHT ANTECUBITAL  Final   Special Requests   Final    BOTTLES DRAWN AEROBIC AND ANAEROBIC Blood Culture results may not be optimal due to an excessive volume of blood received in culture bottles   Culture   Final    NO GROWTH 3 DAYS Performed at [facility], [address] [place], [place] [address]    Report Status PENDING  Incomplete  Culture, blood (Routine X 2) w Reflex to ID Panel     Status: None (Preliminary result)   Collection Time: 08/06/23  3:00 AM   Specimen: BLOOD RIGHT HAND  Result Value Ref Range Status   Specimen Description BLOOD RIGHT HAND  Final   Special Requests   Final    BOTTLES DRAWN AEROBIC AND ANAEROBIC Blood Culture adequate volume   Culture   Final    NO GROWTH 3 DAYS Performed at [facility], [address] [place], [place] [address]    Report Status PENDING  Incomplete  Gram stain     Status: None   Collection Time: 08/08/23 12:57 PM   Specimen: Abdomen; Peritoneal Fluid  Result Value Ref Range Status   Specimen Description PERITONEAL  Final   Special Requests NONE  Final   Gram Stain   Final    WBC PRESENT,BOTH PMN AND MONONUCLEAR NO ORGANISMS SEEN CYTOSPIN SMEAR Performed at [facility], [address] [place], [place] [address]    Report Status 08/08/2023 FINAL  Final  Culture, body fluid w Gram Stain-bottle     Status: None (Preliminary result)   Collection Time: 08/08/23 12:57 PM   Specimen: Peritoneal Washings  Result Value Ref Range Status   Specimen Description PERITONEAL  Final   Special Requests NONE  Final   Culture   Final    NO GROWTH < 24 HOURS Performed at [facility], [address] [place], [place] [address]    Report Status PENDING  Incomplete    Radiology Reports DG CHEST PORT 1 VIEW  Result Date: 08/19/2023 CLINICAL DATA:  200808 with hypoxia. EXAM: PORTABLE CHEST 1 VIEW COMPARISON:  Portable chest  08/07/2023 at 6:40 a.m. FINDINGS: 6:03 a.m. Patchy airspace disease has developed throughout the lung fields with relative left apical sparing. Findings most likely due to widespread pneumonia, less likely airspace edema. Minimal pleural effusions have also formed. The cardiac size remains normal. The mediastinum is normally outlined. No new osseous findings. Thoracic  spondylosis. IMPRESSION: Interval development of patchy airspace disease throughout the lung fields with relative left apical sparing. Findings most likely due to widespread pneumonia, less likely airspace edema. Electronically Signed   By: Almira Bar M.D.   On: 08/27/2023 06:26   IR Paracentesis  Result Date: 08/08/2023 INDICATION: NASH cirrhosis, recurrent ascites EXAM: ULTRASOUND GUIDED DIAGNOSTIC AND THERAPEUTIC PARACENTESIS MEDICATIONS: 10 mL 1% lidocaine COMPLICATIONS: None immediate. PROCEDURE: Informed written consent was obtained from the patient after a discussion of the risks, benefits and alternatives to treatment. A timeout was performed prior to the initiation of the procedure. Initial ultrasound scanning demonstrates a small amount of ascites within the right lower abdominal quadrant. The right lower abdomen was prepped and draped in the usual sterile fashion. 1% lidocaine was used for local anesthesia. Following this, a 19 gauge, 10-cm, Yueh catheter was introduced. An ultrasound image was saved for documentation purposes. The paracentesis was performed. The catheter was removed and a dressing was applied. The patient tolerated the procedure well without immediate post procedural complication. Patient received post-procedure intravenous albumin; see nursing notes for details. FINDINGS: A total of approximately 2.3 liters of yellow fluid was removed. Samples were sent to the laboratory as requested by the clinical team. IMPRESSION: Successful ultrasound-guided paracentesis yielding 2.3 liters of peritoneal fluid. This exam was  performed by Loyce Dys PA-C. Electronically Signed   By: Judie Petit.  Shick M.D.   On: 08/08/2023 13:45   [place] ASCITES (ABDOMEN LIMITED)  Result Date: 08/07/2023 CLINICAL DATA:  72 year old female with suspected ascites. EXAM: LIMITED ABDOMEN ULTRASOUND FOR ASCITES TECHNIQUE: Limited ultrasound survey for ascites was performed in all four abdominal quadrants. COMPARISON:  Abdominal ultrasound 08/06/2023. FINDINGS: Moderate volume of free fluid noted in the abdomen and pelvis in all 4 quadrants, slightly increased compared to the recent prior examination. Liver has a shrunken appearance and nodular contour, indicative of advanced cirrhosis. IMPRESSION: 1. Advanced cirrhosis with moderate volume of ascites, increased compared to the prior examination. Electronically Signed   By: Trudie Reed M.D.   On: 08/07/2023 13:12   DG Chest Port 1 View  Result Date: 08/07/2023 CLINICAL DATA:  72 year old female with history of shortness of breath. EXAM: PORTABLE CHEST 1 VIEW COMPARISON:  Chest x-ray 07/30/2023. FINDINGS: Chronic elevation of the right hemidiaphragm is unchanged. No acute consolidative airspace disease. However, there is widespread interstitial prominence and patchy peribronchial cuffing scattered throughout the lungs bilaterally. No pleural effusions. No pneumothorax. No evidence of pulmonary edema. Heart size is normal. Upper mediastinal contours are within normal limits. IMPRESSION: 1. Widespread interstitial prominence and peribronchial cuffing concerning for potential acute bronchitis. 2. Chronic elevation of the right hemidiaphragm is unchanged. Electronically Signed   By: Trudie Reed M.D.   On: 08/07/2023 07:09   US Abdomen Limited  Result Date: 08/06/2023 CLINICAL DATA:  Ascites EXAM: LIMITED ABDOMEN ULTRASOUND FOR ASCITES TECHNIQUE: Limited ultrasound survey for ascites was performed in all four abdominal quadrants. COMPARISON:  07/21/2023 FINDINGS: Small to moderate free fluid in the abdomen  and pelvis, most notable in the right upper quadrant. This is significantly smaller than prior study. IMPRESSION: Small to moderate free fluid, significantly decreased since prior study. Electronically Signed   By: Charlett Nose M.D.   On: 08/06/2023 02:06   DG Chest Port 1 View  Result Date: 08/06/2023 CLINICAL DATA:  Altered mental status EXAM: PORTABLE CHEST 1 VIEW COMPARISON:  07/02/2023 FINDINGS: Mild elevation of the right hemidiaphragm. Heart and mediastinal contours are within normal limits. No focal opacities or  effusions. No acute bony abnormality. IMPRESSION: No active disease. Electronically Signed   By: Charlett Nose M.D.   On: 08/06/2023 00:01      Signature  -   Mliss Fritz Deanza Upperman M.D on 08/27/2023 at 12:36 PM   -  To page go to [email or web address]

## 2023-09-05 NOTE — Progress Notes (Signed)
Pt called out for water. Upon entering room, the pt sats were 84% on [facility] but had to increase up to [facility] and sats still only 88-89%. Pt can be heard wheezing. Crackles midway down. Using assessory muscles. Pt denies being SOA and CP. MD made aware and he came to see pt. New orders noted. RT came and brought a high flow [place] which we did not have and on 12L she is 93%.

## 2023-09-05 NOTE — Progress Notes (Signed)
Speech Language Pathology Treatment:  Patient Details Name: Dana Dyer MRN: [id number] DOB: 08/22/1951 Today's Date: 08/14/2023 Time: 0865-7846 SLP Time Calculation (min) (ACUTE ONLY): 10 min   MBS cancelled after Palliative care meeting. Pt is going comfort care and NP will order regular diet/thin liquids. ST orders have been discontinued.     Royce Macadamia  08/30/2023, 10:41 AM

## 2023-09-05 NOTE — Plan of Care (Signed)
Pt has rested quietly throughout the night with no distress noted. Medicated with haldol for agitation and restlessness with relief noted. Pt incontinent of bowel and bladder throughout the night. Voids large amounts at a time, saturating the blue pads down through sheet. Pt placed on high flow [place] for decreasing sats (see prior note). CXR done.    Problem: Education: Goal: Knowledge of General Education information will improve Description: Including pain rating scale, medication(s)/side effects and non-pharmacologic comfort measures Outcome: Progressing   Problem: Health Behavior/Discharge Planning: Goal: Ability to manage health-related needs will improve Outcome: Progressing   Problem: Clinical Measurements: Goal: Ability to maintain clinical measurements within normal limits will improve Outcome: Progressing Goal: Will remain free from infection Outcome: Progressing Goal: Diagnostic test results will improve Outcome: Progressing Goal: Respiratory complications will improve Outcome: Progressing Goal: Cardiovascular complication will be avoided Outcome: Progressing   Problem: Activity: Goal: Risk for activity intolerance will decrease Outcome: Progressing   Problem: Nutrition: Goal: Adequate nutrition will be maintained Outcome: Progressing   Problem: Coping: Goal: Level of anxiety will decrease Outcome: Progressing   Problem: Elimination: Goal: Will not experience complications related to bowel motility Outcome: Progressing Goal: Will not experience complications related to urinary retention Outcome: Progressing   Problem: Pain Managment: Goal: General experience of comfort will improve Outcome: Progressing   Problem: Safety: Goal: Ability to remain free from injury will improve Outcome: Progressing   Problem: Skin Integrity: Goal: Risk for impaired skin integrity will decrease Outcome: Progressing   Problem: Education: Goal: Ability to describe self-care  measures that may prevent or decrease complications (Diabetes Survival Skills Education) will improve Outcome: Progressing Goal: Individualized Educational Video(s) Outcome: Progressing   Problem: Coping: Goal: Ability to adjust to condition or change in health will improve Outcome: Progressing   Problem: Fluid Volume: Goal: Ability to maintain a balanced intake and output will improve Outcome: Progressing   Problem: Health Behavior/Discharge Planning: Goal: Ability to identify and utilize available resources and services will improve Outcome: Progressing Goal: Ability to manage health-related needs will improve Outcome: Progressing   Problem: Metabolic: Goal: Ability to maintain appropriate glucose levels will improve Outcome: Progressing   Problem: Nutritional: Goal: Maintenance of adequate nutrition will improve Outcome: Progressing Goal: Progress toward achieving an optimal weight will improve Outcome: Progressing   Problem: Skin Integrity: Goal: Risk for impaired skin integrity will decrease Outcome: Progressing   Problem: Tissue Perfusion: Goal: Adequacy of tissue perfusion will improve Outcome: Progressing

## 2023-09-05 NOTE — Progress Notes (Signed)
Speech Language Pathology Treatment: Dysphagia  Patient Details Name: Dana Dyer MRN: [id number] DOB: 01/31/1951 Today's Date: 08/12/2023 Time: 4098-1191 SLP Time Calculation (min) (ACUTE ONLY): 10 min  Assessment / Plan / Recommendation Clinical Impression  Pt desating this morning, CXR showing pna, made NPO and MD requested SLP to see pt. She is conversant following commands with increased work of breathing at baseline. Pt given puree, applesauce and thin liquids with intermittent delayed coughing throughout session difficult to discern if from po's or baseline respiratory status. Recommend MBS which is scheduled for today at 12:30. She may have meds in applesauce and sips water until MBS.   HPI HPI: Pt is a 72 yo female presenting 8/31 with AMS suspected to be acute hepatic encephalopathy. PMH includes: GERD, Elita Bo[place] cirrhosis, chronic ascites, HTN, hepatic encephalopathy, multiple cystic pancreatic lesion, hemorrhoid s/p banding, DM II, generalized anxiety disorder      SLP Plan  MBS      Recommendations for follow up therapy are one component of a multi-disciplinary discharge planning process, led by the attending physician.  Recommendations may be updated based on patient status, additional functional criteria and insurance authorization.    Recommendations  Diet recommendations:  (can have applesauce until MBS, sips water) Liquids provided via: Cup;Straw Medication Administration: Whole meds with puree                  Oral care BID     Dysphagia, unspecified (R13.10)     MBS     Royce Macadamia  09/03/2023, 8:52 AM

## 2023-09-05 NOTE — Progress Notes (Signed)
Palliative Medicine Inpatient Follow Up Note HPI: Dana Dyer is a 72 y.o. female with medical history significant of Dana Dyer cirrhosis, chronic ascites, hypertension, hepatic encephalopathy, GERD, multiple cystic pancreatic lesion, hemorrhoid s/p banding, insulin dependent DM type II and generalized anxiety disorder.   Palliative care has been asked to get involved in the setting of hepatorenal syndrome to further discuss goals of care.  Today's Discussion 08/20/2023  *Please note that this is a verbal dictation therefore any spelling or grammatical errors are due to the "[employer] One" system interpretation.  Chart reviewed inclusive of vital signs, progress notes, laboratory results, and diagnostic images.   I met with Dana Dyer, her spouse, Dana Dyer, her  son Dana Dyer, and her daughter, Dana Dyer.  We discussed Dana Dyer's further decline in health over the past 24 hours. We reviewed that she is doing very poorly at this time and is now contending with a significant pneumonia. We reviewed discussed that she now has three organ systems that are in extreme dysfunction and this is worrisome in the long run. At this time I shared we can continue present measures or focus on comfort.   After speaking with Dana Dyer it was decided that we would focus on symptoms though continue antibiotics. We discussed that she is not stable to get a swallow study. Dana Dyer is coming in and out in terms of her understanding and level of awareness it seems.   Created space and opportunity for patient to explore thoughts feelings and fears regarding current medical situation. She shares that she has a strong will and "does not feel ready to give up yet". I vocalized understanding though in addition shared that it's her body which is showing what it can and cannot do.  For the time being plan to focus on symptoms while also treating her infection.  Questions and concerns addressed/Palliative Support Provided.   ______________________________ Addendum:  I came back to bedside later in the morning. Patients spouse shares that he has accepted the unfortunate reality that his wife is very sick. He shares that he does not want to see her suffer. We discussed options at this time and the delicate balance of respecting Dana Dyer's wishes while also acknowledging family desires.   I shared it is not clear Dana Dyer understands her situation completely. We again talked about managing symptoms - a modified comfort approach to care in the we would continue antibiotics for this dose. We discussed starting a low dose opoid gtt for respiratory symptoms.   ______________________________ Addendum #2:  I met at bedside with patients daughter and spouse this early noon. We determined that he condition continues to decline and we will again focus on symptoms while weaning her oxygen. They are aware her time will be short. At this point we do not need to use antibiotics are they are unlikely to change the outcomes which patients family is in agreement with.   Patients spouse asks about cremation Dyer and asserts he would like Dana Dyer funeral home.   Offers time and support given patients current condition and the difficulties associated with it.   Objective Assessment: Vital Signs Vitals:   08/13/2023 0551 08/28/2023 0700  BP:  (!) 108/49  Pulse:  (!) 109  Resp:  20  Temp:  98.7 F (37.1 C)  SpO2: 93% 95%   No intake or output data in the 24 hours ending 08/24/2023 1148 Last Weight  Most recent update: 08/13/2023  5:50 AM    Weight  68.9 kg (151 lb  14.4 oz)            Gen:  Elderly Caucasian F in severe distress HEENT: moist mucous membranes CV: Irregular rate and rhythm  PULM: On 14LPM , breathing Is labored ABD: Large, taught EXT: No edema  Neuro: Alert and oriented x1-2 - coming in and out   SUMMARY OF RECOMMENDATIONS   DNAR/DNI  Comfort focused care  Dilaudid gtt to manage symptoms  Slow  wean of O2 to RA  Anticipate in hospital death  Patients family would like to use Dana Dyer funeral home for Dyer  Ongoing PMT support  Time Spent: 120 Billing based on MDM: High  Problems Addressed: One acute or chronic illness or injury that poses a threat to life or bodily function  Amount and/or Complexity of Data: Category 3:Discussion of management or test interpretation with external physician/other qualified health care professional/appropriate source (not separately reported)  Risks: Parenteral controlled substances and Decision not to resuscitate or to de-escalate care because of poor prognosis ______________________________________________________________________________________ Dana Dyer Palliative Medicine Team Team Cell Phone: [phone] Please utilize secure chat with additional questions, if there is no response within 30 minutes please call the above phone number  Palliative Medicine Team providers are available by phone from 7am to 7pm daily and can be reached through the team cell phone.  Should this patient require assistance outside of these hours, please call the patient's attending physician.

## 2023-09-05 NOTE — Progress Notes (Signed)
Transition from ceftriaxone to Unasyn for now pending palliative care eval.  Unasyn 1.5g IV BID  Ulyses Southward, PharmD, BCIDP, AAHIVP, CPP Infectious Disease Pharmacist 08/26/2023 8:50 AM

## 2023-09-05 NOTE — Progress Notes (Signed)
Physical Therapy Treatment Patient Details Name: Dana Dyer MRN: [id number] DOB: [birth year] Today's Date: 09/02/2023   History of Present Illness The pt is a 72 yo female presenting 8/31 with AMS. Pt found to be severely dehydrated causing AKI which led to insulin build up causing hypoglycemia and hypothermia. 9/4 with incr oxygen demands (HFNC12L) with +pna.  PMH includes: NASH cirrhosis, HTN, hepatic encephalopathy, GERD, DM II, and anxiety.    PT Comments  Patient up on BSC on arrival. HR 127 with sats 89% on 12L HFNC. Daughter requested to speak out in the hall and stated MD just gave them bad news re: pneumonia and pt will be transitioning to "comfort only." Daughter was fine with PT assisting pt off BSC and offering to help her up to recliner if pt desired. HR maintained upper 120s and sats 88% (on 15L on portable tank as ambulating around bed to the chair). Patient thankful to be up in chair. Back on 12L at end of session with sats 90% HR 126. Will await confirmation of plans for comfort care (Palliative meeting pending per RN) and likely discharge from PT.     If plan is discharge home, recommend the following: Assistance with cooking/housework;Direct supervision/assist for medications management;Direct supervision/assist for financial management;Assist for transportation;Help with stairs or ramp for entrance;A little help with walking and/or transfers   Can travel by private vehicle        Equipment Recommendations  Wheelchair (measurements PT);Wheelchair cushion (measurements PT);BSC/3in1;Rolling walker (2 wheels) (HH aide)    Recommendations for Other Services       Precautions / Restrictions Precautions Precautions: Fall Precaution Comments: watch HR Restrictions Weight Bearing Restrictions: No     Mobility  Bed Mobility                    Transfers Overall transfer level: Needs assistance Equipment used: None Transfers: Sit to/from Stand, Bed to  chair/wheelchair/BSC Sit to Stand: Contact guard assist   Step pivot transfers: Contact guard assist       General transfer comment: Pt on BSC on arrival. Finished and wanting to get to chair. Pivoted BSC to bed to allow path to be cleared to walk around bed to chair.    Ambulation/Gait Ambulation/Gait assistance: Min assist Gait Distance (Feet): 12 Feet Assistive device: None Gait Pattern/deviations: Step-through pattern, Decreased stride length       General Gait Details: donned brief and ambulated short distance to chair due to incr HR and decr sats with incr O2 needs   Stairs             Wheelchair Mobility     Tilt Bed    Modified Rankin (Stroke Patients Only)       Balance Overall balance assessment: Needs assistance Sitting-balance support: No upper extremity supported, Feet supported Sitting balance-Leahy Scale: Fair     Standing balance support: During functional activity, No upper extremity supported Standing balance-Leahy Scale: Fair Standing balance comment: able to stand without UE support                            Cognition Arousal: Alert Behavior During Therapy: WFL for tasks assessed/performed Overall Cognitive Status: Impaired/Different from baseline Area of Impairment: Memory, Following commands, Safety/judgement, Problem solving, Attention, Awareness                   Current Attention Level: Sustained Memory: Decreased short-term memory, Decreased recall of precautions Following  Commands: Follows one step commands inconsistently, Follows one step commands with increased time Safety/Judgement: Decreased awareness of safety, Decreased awareness of deficits Awareness: Intellectual Problem Solving: Slow processing, Decreased initiation, Difficulty sequencing, Requires verbal cues, Requires tactile cues General Comments: decr awareness of constant diarrhea--despite cleaning her up twice during session and explaining not  ok to get up to chair right now, she then asked if she could go walking        Exercises      General Comments General comments (skin integrity, edema, etc.): Daughter present and reports pt just "given bad news" re: pna and "comfort only." Pt wanting to get up and go to recliner and assisted.      Pertinent Vitals/Pain Pain Assessment Pain Assessment: No/denies pain Faces Pain Scale: No hurt    Home Living                          Prior Function            PT Goals (current goals can now be found in the care plan section) Acute Rehab PT Goals Patient Stated Goal: return home, plan for daughter to assist Time For Goal Achievement: 08/20/23 Potential to Achieve Goals: Good Progress towards PT goals: Progressing toward goals    Frequency    Min 1X/week      PT Plan      Co-evaluation              AM-PAC PT "6 Clicks" Mobility   Outcome Measure  Help needed turning from your back to your side while in a flat bed without using bedrails?: A Little Help needed moving from lying on your back to sitting on the side of a flat bed without using bedrails?: A Little Help needed moving to and from a bed to a chair (including a wheelchair)?: A Little Help needed standing up from a chair using your arms (e.g., wheelchair or bedside chair)?: A Little Help needed to walk in hospital room?: Total (<20 ft) Help needed climbing 3-5 steps with a railing? : Total 6 Click Score: 14    End of Session Equipment Utilized During Treatment: Gait belt;Oxygen Activity Tolerance: Treatment limited secondary to medical complications (Comment) (incr HR; sats 88% on 12L) Patient left: with call bell/phone within reach;in chair;with chair alarm set Nurse Communication: Mobility status;Other (comment) (brief on due to recent diarrhea) PT Visit Diagnosis: Unsteadiness on feet (R26.81);Muscle weakness (generalized) (M62.81)     Time: 7829-5621 PT Time Calculation (min) (ACUTE  ONLY): 17 min  Charges:    $Gait Training: 8-22 mins PT General Charges $$ ACUTE PT VISIT: 1 Visit                      Jerolyn Center, PT Acute Rehabilitation Services  Office [phone]    Zena Amos 08/16/2023, 8:47 AM

## 2023-09-05 NOTE — Care Management Important Message (Signed)
Important Message  Patient Details  Name: Dana Dyer MRN: [id number] Date of Birth: [birth year]   Medicare Important Message Given:  Yes     Nabiha Planck Stefan Church 08/10/2023, 4:37 PM

## 2023-09-05 NOTE — Progress Notes (Signed)
Patient having desaturation issues on 6lpm. Placed patient on 12lpm salter cannula with Sp02 improving to 91-93%. B/S scattered crackles.

## 2023-09-05 NOTE — Death Summary Note (Signed)
DEATH SUMMARY   Patient Details  Name: Dana Dyer MRN: [id number] DOB: 1951-02-17 WUJ:[id number], Vinetta Bergamo, MD Admission/Discharge Information   Admit Date:  2023/08/12  Date of Death:    Time of Death:    Length of Stay: 3   Principle Cause of death:   Liver cirrhosis secondary to NASH  Hospital Diagnoses:  Principal Problem:   Acute hepatic encephalopathy (HCC) Active Problems:   End of life care   Hepatorenal syndrome (HCC)    Acute renal failure (HCC)   Acute respiratory failure with hypoxia (HCC)   Multifocal pneumonia   Hyperammonemia  Type 2 diabetes mellitus without complication, with long-term current use of insulin (HCC)   Essential hypertension   TRANSAMINASES, SERUM, ELEVATED   Depression, major, single episode, severe (HCC)   Thrombocytopenia (HCC)   Splenomegaly   Liver cirrhosis secondary to NASH (HCC)   Dyslipidemia   Dehydration   GERD (gastroesophageal reflux disease)   Metabolic acidosis   Dysphagia   GAD (generalized anxiety disorder)   Normocytic anemia   Hypokalemia   Hyponatremia  Hospital Course: 72 y.o. female with medical history significant of Nash cirrhosis, chronic ascites, hypertension, hepatic encephalopathy, GERD, multiple cystic pancreatic lesion, GERD, hemorrhoid s/p banding, insulin dependent DM type II and generalized anxiety disorder presented to emergency department for evaluation for altered mental status.  Further workup showed that she was severely dehydrated >>  AKI >> relative insulin overdose due to AKI,  hypoglycemia and hypothermia.  She was admitted for metabolic encephalopathy caused by combination of AKI, dehydration, hypoglycemia and mildly elevated ammonia levels.  As well patient was clinically dehydrated, as well she had protein calorie malnutrition caused by her Elita Boone, with hypoalbuminemia, there is significant third spacing, there was a concern for hepatorenal syndrome, pulmonary were consulted, she was treated  with albumin, midodrine as well, nephrology has been following closely, as well her encephalopathy has improved with Xifaxan, and lactulose, but patient with significant deconditioning, increased facility, palliative medicine has been following closely, overall patient had multiple comorbidities, felt to have poor prognosis, on 9//2024, patient with significantly increased oxygen requirement, up to 15 L oxygen, chest x-ray was significant for multifocal pneumonia, concerning for aspiration pneumonia as well, after palliative medicine discussion with family, decision has been made to transition to full comfort measures, patient passed away peacefully at 4:16 PM, with the presence of her family members,, they were appreciated of hospital care.    Assessment and Plan: No notes have been filed under this hospital service. Service: Hospitalist      Consultations:  Renal Gastroenterology Palliative  The results of significant diagnostics from this hospitalization (including imaging, microbiology, ancillary and laboratory) are listed below for reference.   Significant Diagnostic Studies: DG CHEST PORT 1 VIEW  Result Date: 08/23/2023 CLINICAL DATA:  200808 with hypoxia. EXAM: PORTABLE CHEST 1 VIEW COMPARISON:  Portable chest 08/07/2023 at 6:40 a.m. FINDINGS: 6:03 a.m. Patchy airspace disease has developed throughout the lung fields with relative left apical sparing. Findings most likely due to widespread pneumonia, less likely airspace edema. Minimal pleural effusions have also formed. The cardiac size remains normal. The mediastinum is normally outlined. No new osseous findings. Thoracic spondylosis. IMPRESSION: Interval development of patchy airspace disease throughout the lung fields with relative left apical sparing. Findings most likely due to widespread pneumonia, less likely airspace edema. Electronically Signed   By: Almira Bar M.D.   On: 09/01/2023 06:26   IR Paracentesis  Result Date:  08/08/2023 INDICATION: NASH  cirrhosis, recurrent ascites EXAM: ULTRASOUND GUIDED DIAGNOSTIC AND THERAPEUTIC PARACENTESIS MEDICATIONS: 10 mL 1% lidocaine COMPLICATIONS: None immediate. PROCEDURE: Informed written consent was obtained from the patient after a discussion of the risks, benefits and alternatives to treatment. A timeout was performed prior to the initiation of the procedure. Initial ultrasound scanning demonstrates a small amount of ascites within the right lower abdominal quadrant. The right lower abdomen was prepped and draped in the usual sterile fashion. 1% lidocaine was used for local anesthesia. Following this, a 19 gauge, 10-cm, Yueh catheter was introduced. An ultrasound image was saved for documentation purposes. The paracentesis was performed. The catheter was removed and a dressing was applied. The patient tolerated the procedure well without immediate post procedural complication. Patient received post-procedure intravenous albumin; see nursing notes for details. FINDINGS: A total of approximately 2.3 liters of yellow fluid was removed. Samples were sent to the laboratory as requested by the clinical team. IMPRESSION: Successful ultrasound-guided paracentesis yielding 2.3 liters of peritoneal fluid. This exam was performed by Loyce Dys PA-C. Electronically Signed   By: Judie Petit.  Shick M.D.   On: 08/08/2023 13:45   [place] ASCITES (ABDOMEN LIMITED)  Result Date: 08/07/2023 CLINICAL DATA:  72 year old female with suspected ascites. EXAM: LIMITED ABDOMEN ULTRASOUND FOR ASCITES TECHNIQUE: Limited ultrasound survey for ascites was performed in all four abdominal quadrants. COMPARISON:  Abdominal ultrasound 08/06/2023. FINDINGS: Moderate volume of free fluid noted in the abdomen and pelvis in all 4 quadrants, slightly increased compared to the recent prior examination. Liver has a shrunken appearance and nodular contour, indicative of advanced cirrhosis. IMPRESSION: 1. Advanced cirrhosis with moderate  volume of ascites, increased compared to the prior examination. Electronically Signed   By: Trudie Reed M.D.   On: 08/07/2023 13:12   DG Chest Port 1 View  Result Date: 08/07/2023 CLINICAL DATA:  72 year old female with history of shortness of breath. EXAM: PORTABLE CHEST 1 VIEW COMPARISON:  Chest x-ray 07/17/2023. FINDINGS: Chronic elevation of the right hemidiaphragm is unchanged. No acute consolidative airspace disease. However, there is widespread interstitial prominence and patchy peribronchial cuffing scattered throughout the lungs bilaterally. No pleural effusions. No pneumothorax. No evidence of pulmonary edema. Heart size is normal. Upper mediastinal contours are within normal limits. IMPRESSION: 1. Widespread interstitial prominence and peribronchial cuffing concerning for potential acute bronchitis. 2. Chronic elevation of the right hemidiaphragm is unchanged. Electronically Signed   By: Trudie Reed M.D.   On: 08/07/2023 07:09   US Abdomen Limited  Result Date: 08/06/2023 CLINICAL DATA:  Ascites EXAM: LIMITED ABDOMEN ULTRASOUND FOR ASCITES TECHNIQUE: Limited ultrasound survey for ascites was performed in all four abdominal quadrants. COMPARISON:  07/21/2023 FINDINGS: Small to moderate free fluid in the abdomen and pelvis, most notable in the right upper quadrant. This is significantly smaller than prior study. IMPRESSION: Small to moderate free fluid, significantly decreased since prior study. Electronically Signed   By: Charlett Nose M.D.   On: 08/06/2023 02:06   DG Chest Port 1 View  Result Date: 08/06/2023 CLINICAL DATA:  Altered mental status EXAM: PORTABLE CHEST 1 VIEW COMPARISON:  07/02/2023 FINDINGS: Mild elevation of the right hemidiaphragm. Heart and mediastinal contours are within normal limits. No focal opacities or effusions. No acute bony abnormality. IMPRESSION: No active disease. Electronically Signed   By: Charlett Nose M.D.   On: 08/06/2023 00:01   IR  Paracentesis  Result Date: 08/01/2023 INDICATION: 72 year old female with history of NASH cirrhosis, recurrent ascites. Request made for diagnostic and therapeutic paracentesis. EXAM: ULTRASOUND  GUIDED DIAGNOSTIC AND THERAPEUTIC PARACENTESIS MEDICATIONS: 10 mL 1% lidocaine COMPLICATIONS: None immediate. PROCEDURE: Informed written consent was obtained from the patient after a discussion of the risks, benefits and alternatives to treatment. A timeout was performed prior to the initiation of the procedure. Initial ultrasound scanning demonstrates a small amount of ascites within the right lower abdominal quadrant. The right lower abdomen was prepped and draped in the usual sterile fashion. 1% lidocaine was used for local anesthesia. Following this, a 19 gauge, 7-cm, Yueh catheter was introduced. An ultrasound image was saved for documentation purposes. The paracentesis was performed. The catheter was removed and a dressing was applied. The patient tolerated the procedure well without immediate post procedural complication. FINDINGS: A total of approximately 2.6 liters of yellow fluid was removed. Samples were sent to the laboratory as requested by the clinical team. IMPRESSION: Successful ultrasound-guided paracentesis yielding 2.6 liters of peritoneal fluid. Performed by: Loyce Dys PA-C PLAN: If the patient eventually requires >/=2 paracenteses in a 30 day period, candidacy for formal evaluation by the [facility] Interventional Radiology [facility]pert[facility] will be assessed. Electronically Signed   By: Marliss Coots M.D.   On: 08/01/2023 11:30   US Paracentesis  Result Date: 07/21/2023 INDICATION: Patient with history of NASH cirrhosis, ascites. Request received for diagnostic and therapeutic paracentesis EXAM: ULTRASOUND GUIDED DIAGNOSTIC AND THERAPEUTIC PARACENTESIS MEDICATIONS: 8 mL 1% lidocaine COMPLICATIONS: None immediate. PROCEDURE: Informed written consent was obtained from the patient after  a discussion of the risks, benefits and alternatives to treatment. A timeout was performed prior to the initiation of the procedure. Initial ultrasound scanning demonstrates a moderate amount of ascites within the right lower abdominal quadrant. The right lower abdomen was prepped and draped in the usual sterile fashion. 1% lidocaine was used for local anesthesia. Following this, a 19 gauge, 7-cm, Yueh catheter was introduced. An ultrasound image was saved for documentation purposes. The paracentesis was performed. The catheter was removed and a dressing was applied. The patient tolerated the procedure well without immediate post procedural complication. FINDINGS: A total of approximately 3.7 liters of yellow fluid was removed. Samples were sent to the laboratory as requested by the clinical team. IMPRESSION: Successful ultrasound-guided diagnostic and therapeutic paracentesis yielding 3.7 liters of peritoneal fluid. PLAN: If the patient eventually requires >/=2 paracenteses in a 30 day period, candidacy for formal evaluation by the [facility] Interventional Radiology Portal Hypertension [facility] will be assessed. Performed by: Artemio Aly Electronically Signed   By: Marliss Coots M.D.   On: 07/21/2023 15:59    Microbiology: Recent Results (from the past 240 hour(s))  Culture, body fluid w Gram Stain-bottle     Status: None   Collection Time: 08/01/23 11:05 AM   Specimen: Peritoneal Washings  Result Value Ref Range Status   Specimen Description PERITONEAL  Final   Special Requests NONE  Final   Culture   Final    NO GROWTH 5 DAYS Performed at [facility], [address], [place], [place] [address]    Report Status 08/06/2023 FINAL  Final  Gram stain     Status: None   Collection Time: 08/01/23 11:05 AM   Specimen: Peritoneal Washings  Result Value Ref Range Status   Specimen Description PERITONEAL  Final   Special Requests NONE  Final   Gram Stain   Final    NO WBC SEEN NO ORGANISMS  SEEN Performed at [facility], [address] [place], [place] [address]    Report Status 08/01/2023  FINAL  Final  Culture, blood (Routine X 2) w Reflex to ID Panel     Status: None (Preliminary result)   Collection Time: 08/06/23  2:50 AM   Specimen: BLOOD  Result Value Ref Range Status   Specimen Description BLOOD RIGHT ANTECUBITAL  Final   Special Requests   Final    BOTTLES DRAWN AEROBIC AND ANAEROBIC Blood Culture results may not be optimal due to an excessive volume of blood received in culture bottles   Culture   Final    NO GROWTH 3 DAYS Performed at [facility], [address] [place], [place] [address]    Report Status PENDING  Incomplete  Culture, blood (Routine X 2) w Reflex to ID Panel     Status: None (Preliminary result)   Collection Time: 08/06/23  3:00 AM   Specimen: BLOOD RIGHT HAND  Result Value Ref Range Status   Specimen Description BLOOD RIGHT HAND  Final   Special Requests   Final    BOTTLES DRAWN AEROBIC AND ANAEROBIC Blood Culture adequate volume   Culture   Final    NO GROWTH 3 DAYS Performed at [facility], [address], [place], [place] [address]    Report Status PENDING  Incomplete  Gram stain     Status: None   Collection Time: 08/08/23 12:57 PM   Specimen: Abdomen; Peritoneal Fluid  Result Value Ref Range Status   Specimen Description PERITONEAL  Final   Special Requests NONE  Final   Gram Stain   Final    WBC PRESENT,BOTH PMN AND MONONUCLEAR NO ORGANISMS SEEN CYTOSPIN SMEAR Performed at [facility], [address] [place], [place] [address]    Report Status 08/08/2023 FINAL  Final  Culture, body fluid w Gram Stain-bottle     Status: None (Preliminary result)   Collection Time: 08/08/23 12:57 PM   Specimen: Peritoneal Washings  Result Value Ref Range Status   Specimen Description PERITONEAL  Final   Special Requests NONE  Final   Culture   Final    NO GROWTH < 24 HOURS Performed at [facility], [address], [place], [place] [address]    Report Status PENDING  Incomplete     Signed: Huey Bienenstock, MD

## 2023-09-05 NOTE — Progress Notes (Signed)
Reference number from [facility] - [id number], spoke with Erasmo Score.

## 2023-09-05 NOTE — Progress Notes (Signed)
OT Cancellation Note  Patient Details Name: Dana Dyer MRN: [id number] DOB: September 20, 1951   Cancelled Treatment:    Reason Eval/Treat Not Completed: Other (comment) (Discussed with PT that the pt and family have decided to go comfort care. OT will be signing off, reconsult OT if needed.) 08/17/2023  AB, OTR/L  Acute Rehabilitation Services  Office: [phone]   Tristan Schroeder 09/03/2023, 11:10 AM

## 2023-09-05 DEATH — deceased

## 2023-10-10 ENCOUNTER — Ambulatory Visit: Payer: Medicare PPO | Admitting: Gastroenterology

## 2023-10-13 ENCOUNTER — Ambulatory Visit: Payer: Medicare PPO | Admitting: Family Medicine

## 2023-12-19 ENCOUNTER — Ambulatory Visit: Payer: Medicare PPO | Admitting: Internal Medicine
